# Patient Record
Sex: Male | Born: 1955 | Race: White | Hispanic: No | Marital: Single | Smoking: Never smoker
Health system: Southern US, Community
[De-identification: ages and names within clinical notes are randomized; demographics above are authoritative.]

## PROBLEM LIST (undated history)

## (undated) DIAGNOSIS — F32A Depression, unspecified: Secondary | ICD-10-CM

## (undated) DIAGNOSIS — I2699 Other pulmonary embolism without acute cor pulmonale: Secondary | ICD-10-CM

## (undated) DIAGNOSIS — I890 Lymphedema, not elsewhere classified: Secondary | ICD-10-CM

## (undated) DIAGNOSIS — I10 Essential (primary) hypertension: Secondary | ICD-10-CM

## (undated) DIAGNOSIS — L8990 Pressure ulcer of unspecified site, unspecified stage: Secondary | ICD-10-CM

## (undated) DIAGNOSIS — Z89611 Acquired absence of right leg above knee: Secondary | ICD-10-CM

## (undated) DIAGNOSIS — S88911A Complete traumatic amputation of right lower leg, level unspecified, initial encounter: Secondary | ICD-10-CM

## (undated) DIAGNOSIS — Z96652 Presence of left artificial knee joint: Secondary | ICD-10-CM

## (undated) HISTORY — DX: Lymphedema, not elsewhere classified: I89.0

## (undated) HISTORY — PX: OTHER SURGICAL HISTORY: SHX169

## (undated) HISTORY — PX: SHOULDER SURGERY: SHX246

## (undated) HISTORY — PX: KNEE FUSION: SUR623

## (undated) HISTORY — PX: HIP SURGERY: SHX245

## (undated) HISTORY — DX: Pressure ulcer of unspecified site, unspecified stage: L89.90

## (undated) HISTORY — PX: KNEE SURGERY: SHX244

## (undated) HISTORY — PX: VARICOSE VEIN SURGERY: SHX832

## (undated) HISTORY — PX: REPLACEMENT TOTAL KNEE: SUR1224

## (undated) HISTORY — PX: ACHILLES TENDON SURGERY: SHX542

## (undated) HISTORY — PX: EPIGASTRIC HERNIA REPAIR: SUR1177

---

## 2008-10-20 HISTORY — PX: OTHER SURGICAL HISTORY: SHX169

## 2011-07-02 HISTORY — PX: REPLACEMENT TOTAL KNEE: SUR1224

## 2011-08-13 DIAGNOSIS — Z86718 Personal history of other venous thrombosis and embolism: Secondary | ICD-10-CM

## 2011-08-13 HISTORY — DX: Personal history of other venous thrombosis and embolism: Z86.718

## 2011-09-11 HISTORY — PX: KNEE JOINT MANIPULATION: SHX386

## 2012-11-03 HISTORY — PX: CARDIAC SURGERY: SHX584

## 2015-02-03 HISTORY — PX: NECK SURGERY: SHX720

## 2015-02-11 HISTORY — PX: SPINE SURGERY: SHX786

## 2015-07-28 HISTORY — PX: CERVICAL FUSION: SHX112

## 2015-10-04 HISTORY — PX: OTHER SURGICAL HISTORY: SHX169

## 2015-11-02 HISTORY — PX: OTHER SURGICAL HISTORY: SHX169

## 2016-10-19 ENCOUNTER — Encounter: Payer: Self-pay | Admitting: *Deleted

## 2016-10-19 ENCOUNTER — Emergency Department: Payer: Medicare Other

## 2016-10-19 ENCOUNTER — Emergency Department
Admission: EM | Admit: 2016-10-19 | Discharge: 2016-10-19 | Disposition: A | Discharge status: Home / Self Care | Payer: Medicare Other | Attending: Emergency Medicine | Admitting: Emergency Medicine

## 2016-10-19 DIAGNOSIS — L03116 Cellulitis of left lower limb: Secondary | ICD-10-CM | POA: Diagnosis not present

## 2016-10-19 DIAGNOSIS — R2242 Localized swelling, mass and lump, left lower limb: Secondary | ICD-10-CM | POA: Diagnosis present

## 2016-10-19 DIAGNOSIS — Z89611 Acquired absence of right leg above knee: Secondary | ICD-10-CM | POA: Insufficient documentation

## 2016-10-19 HISTORY — DX: Complete traumatic amputation of right lower leg, level unspecified, initial encounter: S88.911A

## 2016-10-19 HISTORY — DX: Presence of left artificial knee joint: Z96.652

## 2016-10-19 HISTORY — DX: Other pulmonary embolism without acute cor pulmonale: I26.99

## 2016-10-19 LAB — COMPREHENSIVE METABOLIC PANEL
ALT: 19 U/L (ref 17–63)
ANION GAP: 9 (ref 5–15)
AST: 27 U/L (ref 15–41)
Albumin: 4.1 g/dL (ref 3.5–5.0)
Alkaline Phosphatase: 73 U/L (ref 38–126)
BILIRUBIN TOTAL: 1.6 mg/dL — AB (ref 0.3–1.2)
BUN: 11 mg/dL (ref 6–20)
CHLORIDE: 104 mmol/L (ref 101–111)
CO2: 22 mmol/L (ref 22–32)
Calcium: 9.6 mg/dL (ref 8.9–10.3)
Creatinine, Ser: 0.9 mg/dL (ref 0.61–1.24)
Glucose, Bld: 90 mg/dL (ref 65–99)
POTASSIUM: 4.3 mmol/L (ref 3.5–5.1)
Sodium: 135 mmol/L (ref 135–145)
TOTAL PROTEIN: 7.8 g/dL (ref 6.5–8.1)

## 2016-10-19 LAB — CBC WITH DIFFERENTIAL/PLATELET
BASOS ABS: 0.1 10*3/uL (ref 0–0.1)
Basophils Relative: 1 %
EOS PCT: 4 %
Eosinophils Absolute: 0.2 10*3/uL (ref 0–0.7)
HCT: 45.3 % (ref 40.0–52.0)
HEMOGLOBIN: 15.4 g/dL (ref 13.0–18.0)
LYMPHS PCT: 25 %
Lymphs Abs: 1.5 10*3/uL (ref 1.0–3.6)
MCH: 29.5 pg (ref 26.0–34.0)
MCHC: 34.1 g/dL (ref 32.0–36.0)
MCV: 86.5 fL (ref 80.0–100.0)
Monocytes Absolute: 0.9 10*3/uL (ref 0.2–1.0)
Monocytes Relative: 15 %
NEUTROS ABS: 3.3 10*3/uL (ref 1.4–6.5)
NEUTROS PCT: 55 %
PLATELETS: 254 10*3/uL (ref 150–440)
RBC: 5.23 MIL/uL (ref 4.40–5.90)
RDW: 14.1 % (ref 11.5–14.5)
WBC: 5.9 10*3/uL (ref 3.8–10.6)

## 2016-10-19 MED ORDER — SULFAMETHOXAZOLE-TRIMETHOPRIM 800-160 MG PO TABS
1.0000 | ORAL_TABLET | Freq: Once | ORAL | Status: AC
  Administered 2016-10-19: 1 via ORAL
  Filled 2016-10-19: qty 1

## 2016-10-19 MED ORDER — CEPHALEXIN 500 MG PO CAPS
500.0000 mg | ORAL_CAPSULE | Freq: Once | ORAL | Status: AC
  Administered 2016-10-19: 500 mg via ORAL
  Filled 2016-10-19: qty 1

## 2016-10-19 MED ORDER — CEPHALEXIN 500 MG PO CAPS: 500.0000 mg | ORAL_CAPSULE | Freq: Four times a day (QID) | ORAL | 0 refills | Status: AC

## 2016-10-19 MED ORDER — SULFAMETHOXAZOLE-TRIMETHOPRIM 800-160 MG PO TABS: 1.0000 | ORAL_TABLET | Freq: Two times a day (BID) | ORAL | 0 refills | Status: AC

## 2016-10-19 NOTE — ED Triage Notes (Signed)
Pt arrives with left leg pain and swelling, states he has a wound on his lower left leg that is now weeping fluid and infection, states his toes are discolored, leg is tight and red, pt has hx of right AKA, awake and alert

## 2016-10-19 NOTE — ED Triage Notes (Signed)
First Nurse Note:  Arrives with c/o purulent drainage from left lower leg.  States has a history of lymphedema in left lower extremity and was started on Doxycycline on Monday.  Symptoms not improving.  Patient has history of right AKA and left knee replacement.

## 2016-10-19 NOTE — ED Notes (Signed)
X-ray at bedside

## 2016-10-19 NOTE — ED Provider Notes (Signed)
[facility] Emergency Department Provider Note  ____________________________________________   First MD Initiated Contact with Patient 10/19/16 1347     (approximate)  I have reviewed the triage vital signs and the nursing notes.   HISTORY  Chief Complaint Leg Pain and Wound Infection    HPI [name] is a 61 y.o. male who comes to the emergency department with several days of worsening painful swelling to his left lower extremity. Pain is moderate to severe aching throbbing worse with movement and improved with rest. 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 He went to an urgent care 2 days ago who placed him on doxycycline and he feels it is not improved.   Past Medical History:  Diagnosis Date  . Amputated right leg (HCC)   . History of left knee replacement   . PE (pulmonary thromboembolism) (HCC)     There are no active problems to display for this patient.   History reviewed. No pertinent surgical history.  Prior to Admission medications   Medication Sig Start Date End Date Taking? Authorizing Provider  cephALEXin (KEFLEX) 500 MG capsule Take 1 capsule (500 mg total) by mouth 4 (four) times daily. 10/19/16 11/02/16  Merrily Brittle, MD  sulfamethoxazole-trimethoprim (BACTRIM DS,SEPTRA DS) 800-160 MG tablet Take 1 tablet by mouth 2 (two) times daily. 10/19/16 11/02/16  Merrily Brittle, MD    Allergies Bacitracin and Vancomycin  No family history on file.  Social History Social History  Substance Use Topics  . Smoking status: Never Smoker  . Smokeless tobacco: Never Used  . Alcohol use No    Review of Systems Constitutional: No fever/chills Eyes: No visual changes. ENT: No sore throat. Cardiovascular: Denies chest pain. Respiratory: Denies shortness of breath. Gastrointestinal: No abdominal pain.   No nausea, no vomiting.  No diarrhea.  No constipation. Genitourinary: Negative for dysuria. Musculoskeletal: Negative for back pain. Skin: Negative for rash. Neurological: Negative for headaches, focal weakness or numbness.   ____________________________________________   PHYSICAL EXAM:  VITAL SIGNS: ED Triage Vitals  Enc Vitals Group     BP 10/19/16 1335 130/78     Pulse Rate 10/19/16 1335 73     Resp 10/19/16 1335 18     Temp 10/19/16 1335 98.8 F (37.1 C)     Temp Source 10/19/16 1335 Oral     SpO2 10/19/16 1335 97 %     Weight 10/19/16 1332 (!) 340 lb (154.2 kg)     Height 10/19/16 1332 6\' 5"  (1.956 m)     Head Circumference --      Peak Flow --      Pain Score 10/19/16 1332 7     Pain Loc --      Pain Edu? --      Excl. in GC? --     Constitutional: alert and oriented 4 choking laughing well appearing nontoxic no diaphoresis speaks in full clear sentences Eyes: PERRL EOMI. Head: Atraumatic. Nose: No congestion/rhinnorhea. Mouth/Throat: No trismus Neck: No stridor.   Cardiovascular: Normal rate, regular rhythm. Grossly normal heart sounds.  Good peripheral circulation. Respiratory: Normal respiratory effort.  No retractions. Lungs CTAB and moving good air Gastrointestinal: soft nontender Musculoskeletal: AKA on the right. Left lower extremity with chronic swelling nontender no crepitus to posterior cells. His pulse faint erythema and warmth slight serous-appearing discharge medially no purulence Neurologic:  Normal speech and language. No gross focal neurologic deficits are appreciated. Skin:  Skin is warm, dry and intact. No rash noted. Psychiatric: Mood and affect are normal. Speech and behavior are normal.    ____________________________________________   DIFFERENTIAL includes but not limited to  Cellulitis, abscess, necrotizing soft tissue infection, myositis, claudication ____________________________________________   LABS (all labs ordered are  listed, but only abnormal results are displayed)  Labs Reviewed  COMPREHENSIVE METABOLIC PANEL - Abnormal; Notable for the following:       Result Value   Total Bilirubin 1.6 (*)    All other components within normal limits  CBC WITH DIFFERENTIAL/PLATELET    Normal white count __________________________________________  EKG   ____________________________________________  RADIOLOGY  X-ray of the ankle with no fracture dislocation of his osteomyelitis ____________________________________________   PROCEDURES  Procedure(s) performed: no  Procedures  Critical Care performed: no  Observation: no ____________________________________________   INITIAL IMPRESSION / ASSESSMENT AND PLAN / ED COURSE  Pertinent labs & imaging results that were available during my care of the patient were reviewed by me and considered in my medical decision making (see chart for details).  The patient is well-appearing and not systemically ill. His symptoms have been progressive for several days which goes against necrotizing soft tissue infection. His pain is not out of proportion. He is a to posterior cells pedis pulse. I performed a bedside ultrasound which shows a large amount of cobblestoning but no obvious abscess. At this point I do believe he likely has an early cellulitis which would be better treated with Bactrim and Keflex instead of doxycycline. The 2 day follow-up given. Discharged home in good condition.      ____________________________________________   FINAL CLINICAL IMPRESSION(S) / ED DIAGNOSES  Final diagnoses:  Cellulitis of left lower extremity      NEW MEDICATIONS STARTED DURING THIS VISIT:  Discharge Medication List as of 10/19/2016  3:12 PM    START taking these medications   Details  cephALEXin (KEFLEX) 500 MG capsule Take 1 capsule (500 mg total) by mouth 4 (four) times daily., Starting Fri 10/19/2016, Until Fri 11/02/2016, Print      sulfamethoxazole-trimethoprim (BACTRIM DS,SEPTRA DS) 800-160 MG tablet Take 1 tablet by mouth 2 (two) times daily., Starting Fri 10/19/2016, Until Fri 11/02/2016, Print         Note:  This document was prepared using Dragon voice recognition software and may include unintentional dictation errors.     Merrily Brittle, MD 10/22/16 [phone]

## 2016-10-19 NOTE — ED Notes (Signed)
ED Provider at bedside. 

## 2016-10-19 NOTE — Discharge Instructions (Signed)
Please take all of your antibiotics as prescribed and make an appointment to follow up in wound care clinic this coming Monday for reevaluation. Return to the emergency department sooner for any new or worsening symptoms such as fevers, chills, worsening pain, or for any other issues whatsoever.  It was a pleasure to take care of you today, and thank you for coming to our emergency department.  If you have any questions or concerns before leaving please ask the nurse to grab me and I'm more than happy to go through your aftercare instructions again.  If you were prescribed any opioid pain medication today such as Norco, Vicodin, Percocet, morphine, hydrocodone, or oxycodone please make sure you do not drive when you are taking this medication as it can alter your ability to drive safely.  If you have any concerns once you are home that you are not improving or are in fact getting worse before you can make it to your follow-up appointment, please do not hesitate to call 911 and come back for further evaluation.  Merrily Brittle, MD  Results for orders placed or performed during the hospital encounter of 10/19/16  Comprehensive metabolic panel  Result Value Ref Range   Sodium 135 135 - 145 mmol/L   Potassium 4.3 3.5 - 5.1 mmol/L   Chloride 104 101 - 111 mmol/L   CO2 22 22 - 32 mmol/L   Glucose, Bld 90 65 - 99 mg/dL   BUN 11 6 - 20 mg/dL   Creatinine, Ser 9.32 0.61 - 1.24 mg/dL   Calcium 9.6 8.9 - 35.5 mg/dL   Total Protein 7.8 6.5 - 8.1 g/dL   Albumin 4.1 3.5 - 5.0 g/dL   AST 27 15 - 41 U/L   ALT 19 17 - 63 U/L   Alkaline Phosphatase 73 38 - 126 U/L   Total Bilirubin 1.6 (H) 0.3 - 1.2 mg/dL   GFR calc non Af Amer >60 >60 mL/min   GFR calc Af Amer >60 >60 mL/min   Anion gap 9 5 - 15  CBC with Differential  Result Value Ref Range   WBC 5.9 3.8 - 10.6 K/uL   RBC 5.23 4.40 - 5.90 MIL/uL   Hemoglobin 15.4 13.0 - 18.0 g/dL   HCT 73.2 20.2 - 54.2 %   MCV 86.5 80.0 - 100.0 fL   MCH 29.5  26.0 - 34.0 pg   MCHC 34.1 32.0 - 36.0 g/dL   RDW 70.6 23.7 - 62.8 %   Platelets 254 150 - 440 K/uL   Neutrophils Relative % 55 %   Neutro Abs 3.3 1.4 - 6.5 K/uL   Lymphocytes Relative 25 %   Lymphs Abs 1.5 1.0 - 3.6 K/uL   Monocytes Relative 15 %   Monocytes Absolute 0.9 0.2 - 1.0 K/uL   Eosinophils Relative 4 %   Eosinophils Absolute 0.2 0 - 0.7 K/uL   Basophils Relative 1 %   Basophils Absolute 0.1 0 - 0.1 K/uL   Dg Ankle Complete Left  Result Date: 10/19/2016 CLINICAL DATA:  Lower extremity lymph edema. EXAM: LEFT ANKLE COMPLETE - 3+ VIEW COMPARISON:  None. FINDINGS: Large amount of soft tissue which could be a combination of obesity an edematous tissue. Bones in joints appear unremarkable. No traumatic or significant degenerative findings. There is calcification within the Achilles tendon complex. IMPRESSION: No significant bone or joint finding. Prominent soft tissue that could be a combination of obesity and edematous tissue. Electronically Signed   By: Paulina Fusi  M.D.   On: 10/19/2016 14:39

## 2016-10-26 ENCOUNTER — Ambulatory Visit: Payer: Medicare Other | Admitting: Surgery

## 2016-11-06 ENCOUNTER — Encounter: Payer: Medicare Other | Attending: Internal Medicine | Admitting: Internal Medicine

## 2016-11-06 DIAGNOSIS — Z86718 Personal history of other venous thrombosis and embolism: Secondary | ICD-10-CM | POA: Diagnosis not present

## 2016-11-06 DIAGNOSIS — I890 Lymphedema, not elsewhere classified: Secondary | ICD-10-CM | POA: Diagnosis not present

## 2016-11-06 DIAGNOSIS — Z89611 Acquired absence of right leg above knee: Secondary | ICD-10-CM | POA: Diagnosis not present

## 2016-11-06 DIAGNOSIS — L97221 Non-pressure chronic ulcer of left calf limited to breakdown of skin: Secondary | ICD-10-CM | POA: Diagnosis not present

## 2016-11-06 DIAGNOSIS — L03116 Cellulitis of left lower limb: Secondary | ICD-10-CM | POA: Insufficient documentation

## 2016-11-06 DIAGNOSIS — Z7901 Long term (current) use of anticoagulants: Secondary | ICD-10-CM | POA: Insufficient documentation

## 2016-11-06 DIAGNOSIS — G629 Polyneuropathy, unspecified: Secondary | ICD-10-CM | POA: Diagnosis not present

## 2016-11-06 DIAGNOSIS — Z96653 Presence of artificial knee joint, bilateral: Secondary | ICD-10-CM | POA: Insufficient documentation

## 2016-11-06 DIAGNOSIS — I87332 Chronic venous hypertension (idiopathic) with ulcer and inflammation of left lower extremity: Secondary | ICD-10-CM | POA: Insufficient documentation

## 2016-11-06 DIAGNOSIS — G4733 Obstructive sleep apnea (adult) (pediatric): Secondary | ICD-10-CM | POA: Diagnosis not present

## 2016-11-06 DIAGNOSIS — Z881 Allergy status to other antibiotic agents status: Secondary | ICD-10-CM | POA: Insufficient documentation

## 2016-11-07 NOTE — Progress Notes (Signed)
[name] ([id number]) Visit Report for 11/06/2016 Abuse/Suicide Risk Screen Details Patient Name: [name], [name] Date of Service: 11/06/2016 8:00 AM Medical Record Number: [id number] Patient Account Number: [id number] Date of Birth/Sex: Mar 29, 1955 (61 y.o. Male) Treating RN: Phillis Haggis Primary Care Rustyn Conery: Rolin Barry Other Clinician: Referring Joylynn Defrancesco: Voncille Lo Treating Rosie Golson/Extender: Rudene Re in Treatment: 0 Abuse/Suicide Risk Screen Items Answer ABUSE/SUICIDE RISK SCREEN: Has anyone close to you tried to hurt or harm you recentlyo No Do you feel uncomfortable with anyone in your familyo No Has anyone forced you do things that you didnot want to doo No Do you have any thoughts of harming yourselfo No Patient displays signs or symptoms of abuse and/or neglect. No Electronic Signature(s) Signed: 11/06/2016 5:27:59 PM By: Alejandro Mulling Entered By: Alejandro Mulling on 11/06/2016 08:15:01 Crossland, Yuniel ([id number]) -------------------------------------------------------------------------------- Activities of Daily Living Details Patient Name: [name] Date of Service: 11/06/2016 8:00 AM Medical Record Number: [id number] Patient Account Number: [id number] Date of Birth/Sex: 03/04/1956 (61 y.o. Male) Treating RN: Phillis Haggis Primary Care Shiara Mcgough: Rolin Barry Other Clinician: Referring Rudell Marlowe: Voncille Lo Treating Tyannah Sane/Extender: Rudene Re in Treatment: 0 Activities of Daily Living Items Answer Activities of Daily Living (Please select one for each item) Drive Automobile Not Able Take Medications Completely Able Use Telephone Completely Able Care for Appearance Completely Able Use Toilet Completely Able Bath / Shower Completely Able Dress Self Completely Able Feed Self Completely Able Walk Completely Able Get In / Out Bed Completely Able Housework Completely Able Prepare Meals Completely Able Handle Money Completely  Able Shop for Self Completely Able Electronic Signature(s) Signed: 11/06/2016 5:27:59 PM By: Alejandro Mulling Entered By: Alejandro Mulling on 11/06/2016 08:15:30 Pogorzelski, Chasten ([id number]) -------------------------------------------------------------------------------- Education Assessment Details Patient Name: [name] Date of Service: 11/06/2016 8:00 AM Medical Record Number: [id number] Patient Account Number: [id number] Date of Birth/Sex: 1955/10/05 (61 y.o. Male) Treating RN: Phillis Haggis Primary Care Skyley Grandmaison: Rolin Barry Other Clinician: Referring Blaike Newburn: Voncille Lo Treating Dawayne Ohair/Extender: Rudene Re in Treatment: 0 Primary Learner Assessed: Patient Learning Preferences/Education Level/Primary Language Learning Preference: Explanation, Printed Material Highest Education Level: College or Above Preferred Language: English Cognitive Barrier Assessment/Beliefs Language Barrier: No Translator Needed: No Memory Deficit: No Emotional Barrier: No Cultural/Religious Beliefs Affecting Medical No Care: Physical Barrier Assessment Impaired Vision: Yes Glasses Impaired Hearing: No Decreased Hand dexterity: No Knowledge/Comprehension Assessment Knowledge Level: Medium Comprehension Level: Medium Ability to understand written Medium instructions: Ability to understand verbal Medium instructions: Motivation Assessment Anxiety Level: Calm Cooperation: Cooperative Education Importance: Acknowledges Need Interest in Health Problems: Asks Questions Perception: Coherent Willingness to Engage in Self- Medium Management Activities: Readiness to Engage in Self- Medium Management Activities: Electronic Signature(s) Cambria, Mylen ([id number]) Signed: 11/06/2016 5:27:59 PM By: Alejandro Mulling Entered By: Alejandro Mulling on 11/06/2016 08:16:15 Reinitz, Delmas ([id number]) -------------------------------------------------------------------------------- Fall  Risk Assessment Details Patient Name: [name] Date of Service: 11/06/2016 8:00 AM Medical Record Number: [id number] Patient Account Number: [id number] Date of Birth/Sex: 1956-02-26 (61 y.o. Male) Treating RN: Phillis Haggis Primary Care Arma Reining: Rolin Barry Other Clinician: Referring Gwynne Kemnitz: Voncille Lo Treating Arraya Buck/Extender: Rudene Re in Treatment: 0 Fall Risk Assessment Items Have you had 2 or more falls in the last 12 monthso 0 No Have you had any fall that resulted in injury in the last 12 monthso 0 No FALL RISK ASSESSMENT: History of falling - immediate or within 3 months 0 No Secondary diagnosis 15 Yes Ambulatory aid None/bed rest/wheelchair/nurse 0 No Crutches/cane/walker 15 Yes Furniture 0 No IV Access/Saline Lock 0 No  Gait/Training Normal/bed rest/immobile 0 No Weak 10 Yes Impaired 20 Yes Mental Status Oriented to own ability 0 Yes Electronic Signature(s) Signed: 11/06/2016 5:27:59 PM By: Alejandro MullingPinkerton, Debra Entered By: Alejandro MullingPinkerton, Debra on 11/06/2016 08:26:57 Stennett, Yer ([id number]) -------------------------------------------------------------------------------- Foot Assessment Details Patient Name: [name] Date of Service: 11/06/2016 8:00 AM Medical Record Number: [id number] Patient Account Number: [id number] Date of Birth/Sex: 07-Dec-1955 [age] y.o. Male) Treating RN: Phillis HaggisPinkerton, Debi Primary Care Aaleah Hirsch: Rolin Barrylmedo, Mario Other Clinician: Referring Wing Schoch: Voncille LoGEERSEN, DANIEL Treating Renne Cornick/Extender: Maxwell CaulOBSON, MICHAEL G Weeks in Treatment: 0 Foot Assessment Items Site Locations + = Sensation present, - = Sensation absent, C = Callus, U = Ulcer R = Redness, W = Warmth, M = Maceration, PU = Pre-ulcerative lesion F = Fissure, S = Swelling, D = Dryness Assessment Right: Left: Other Deformity: No No Prior Foot Ulcer: No No Prior Amputation: No No Charcot Joint: No No Ambulatory Status: Ambulatory With Help Assistance Device:  Crutches Gait: Steady Electronic Signature(s) Signed: 11/06/2016 5:27:59 PM By: Alejandro MullingPinkerton, Debra Entered By: Alejandro MullingPinkerton, Debra on 11/06/2016 08:27:23 Hirt, Sanay ([id number]) -------------------------------------------------------------------------------- Nutrition Risk Assessment Details Patient Name: [name] SereneGABRIEL, Garrin Date of Service: 11/06/2016 8:00 AM Medical Record Number: [id number] Patient Account Number: [id number] Date of Birth/Sex: 07-Dec-1955 [age] y.o. Male) Treating RN: Phillis HaggisPinkerton, Debi Primary Care Elizabet Schweppe: Rolin Barrylmedo, Mario Other Clinician: Referring Zeffie Bickert: Voncille LoGEERSEN, DANIEL Treating Yoceline Bazar/Extender: Maxwell CaulOBSON, MICHAEL G Weeks in Treatment: 0 Height (in): 77 Weight (lbs): 340 Body Mass Index (BMI): 40.3 Nutrition Risk Assessment Items NUTRITION RISK SCREEN: I have an illness or condition that made me change the kind and/or 2 Yes amount of food I eat I eat fewer than two meals per day 0 No I eat few fruits and vegetables, or milk products 0 No I have three or more drinks of beer, liquor or wine almost every day 0 No I have tooth or mouth problems that make it hard for me to eat 0 No I don't always have enough money to buy the food I need 0 No I eat alone most of the time 0 No I take three or more different prescribed or over-the-counter drugs a 0 No day Without wanting to, I have lost or gained 10 pounds in the last six 0 No months I am not always physically able to shop, cook and/or feed myself 0 No Nutrition Protocols Good Risk Protocol Moderate Risk Protocol Electronic Signature(s) Signed: 11/06/2016 5:27:59 PM By: Alejandro MullingPinkerton, Debra Entered By: Alejandro MullingPinkerton, Debra on 11/06/2016 08:27:05

## 2016-11-07 NOTE — Progress Notes (Signed)
Marriott, Tory ([id number]) Visit Report for 11/06/2016 Allergy List Details Patient Name: [name] SereneGABRIEL, Elhadji Date of Service: 11/06/2016 8:00 AM Medical Record Number: [id number] Patient Account Number: [id number] Date of Birth/Sex: [birth year] [age] y.o. Male) Treating RN: Phillis HaggisPinkerton, Debi Primary Care Jacklynn Dehaas: Rolin Barrylmedo, MarioVicente [name] Other Clinician: Referring Maleny Candy: Voncille LoGEERSEN, DANIEL Treating Dequavius Kuhner/Extender: Rudene ReBritto, Errol Weeks in Treatment: 0 Allergies Active Allergies bacitracin vancomycin Allergy Notes Electronic Signature(s) Signed: 11/06/2016 5:27:59 PM By: Alejandro MullingPinkerton, Debra Entered By: Alejandro MullingPinkerton, Debra on 11/06/2016 08:07:40 Ribera, Cristal ([id number]) -------------------------------------------------------------------------------- Arrival Information Details Patient Name: [name] SereneGABRIEL, Facundo Date of Service: 11/06/2016 8:00 AM Medical Record Number: [id number] Patient Account Number: [id number] Date of Birth/Sex: [birth year] 3(61 y.o. Male) Treating RN: Phillis HaggisPinkerton, Debi Primary Care Daxson Reffett: Rolin Barrylmedo, Mario Other Clinician: Referring Latessa Tillis: Voncille LoGEERSEN, DANIEL Treating Staley Lunz/Extender: Rudene ReBritto, Errol Weeks in Treatment: 0 Visit Information Patient Arrived: Crutches Arrival Time: 08:00 Accompanied By: self Transfer Assistance: EasyPivot Patient Lift Patient Identification Verified: Yes Secondary Verification Process Yes Completed: Patient Requires Transmission- No Based Precautions: Patient Has Alerts: Yes Patient Alerts: Patient on Blood Thinner warfarin NO ABI d/t PAIN Electronic Signature(s) Signed: 11/06/2016 5:27:59 PM By: Alejandro MullingPinkerton, Debra Entered By: Alejandro MullingPinkerton, Debra on 11/06/2016 08:42:27 Renfrew, Kacper ([id number]) -------------------------------------------------------------------------------- Clinic Level of Care Assessment Details Patient Name: [name] SereneGABRIEL, Marx Date of Service: 11/06/2016 8:00 AM Medical Record Number: [id number] Patient Account Number: [id number] Date of Birth/Sex:  [birth year] [age] y.o. Male) Treating RN: Phillis HaggisPinkerton, Debi Primary Care Vienna Folden: Rolin Barrylmedo, Mario Other Clinician: Referring Kinsey Cowsert: Voncille LoGEERSEN, DANIEL Treating Tobin Cadiente/Extender: Maxwell CaulOBSON, MICHAEL G Weeks in Treatment: 0 Clinic Level of Care Assessment Items TOOL 2 Quantity Score X - Use when only an EandM is performed on the INITIAL visit 1 0 ASSESSMENTS - Nursing Assessment / Reassessment X - General Physical Exam (combine w/ comprehensive assessment (listed just 1 20 below) when performed on new pt. evals) X - Comprehensive Assessment (HX, ROS, Risk Assessments, Wounds Hx, etc.) 1 25 ASSESSMENTS - Wound and Skin Assessment / Reassessment []  - Simple Wound Assessment / Reassessment - one wound 0 X - Complex Wound Assessment / Reassessment - multiple wounds 2 5 []  - Dermatologic / Skin Assessment (not related to wound area) 0 ASSESSMENTS - Ostomy and/or Continence Assessment and Care []  - Incontinence Assessment and Management 0 []  - Ostomy Care Assessment and Management (repouching, etc.) 0 PROCESS - Coordination of Care []  - Simple Patient / Family Education for ongoing care 0 X - Complex (extensive) Patient / Family Education for ongoing care 1 20 X - Staff obtains ChiropractorConsents, Records, Test Results / Process Orders 1 10 X - Staff telephones HHA, Nursing Homes / Clarify orders / etc 1 10 []  - Routine Transfer to another Facility (non-emergent condition) 0 []  - Routine Hospital Admission (non-emergent condition) 0 X - New Admissions / Manufacturing engineernsurance Authorizations / Ordering NPWT, Apligraf, etc. 1 15 []  - Emergency Hospital Admission (emergent condition) 0 X - Simple Discharge Coordination 1 10 Benda, Anel ([id number]) []  - Complex (extensive) Discharge Coordination 0 PROCESS - Special Needs []  - Pediatric / Minor Patient Management 0 []  - Isolation Patient Management 0 []  - Hearing / Language / Visual special needs 0 []  - Assessment of Community assistance (transportation, D/C planning,  etc.) 0 []  - Additional assistance / Altered mentation 0 []  - Support Surface(s) Assessment (bed, cushion, seat, etc.) 0 INTERVENTIONS - Wound Cleansing / Measurement []  - Wound Imaging (photographs - any number of wounds) 0 X - Wound Tracing (instead of photographs) 1 5 []  - Simple Wound Measurement - one wound 0 X - Complex Wound  Measurement - multiple wounds 2 5 []  - Simple Wound Cleansing - one wound 0 X - Complex Wound Cleansing - multiple wounds 2 5 INTERVENTIONS - Wound Dressings []  - Small Wound Dressing one or multiple wounds 0 []  - Medium Wound Dressing one or multiple wounds 0 X - Large Wound Dressing one or multiple wounds 1 20 []  - Application of Medications - injection 0 INTERVENTIONS - Miscellaneous []  - External ear exam 0 []  - Specimen Collection (cultures, biopsies, blood, body fluids, etc.) 0 []  - Specimen(s) / Culture(s) sent or taken to Lab for analysis 0 []  - Patient Transfer (multiple staff / Michiel Sites Lift / Similar devices) 0 []  - Simple Staple / Suture removal (25 or less) 0 []  - Complex Staple / Suture removal (26 or more) 0 Stender, Swayze ([id number]) []  - Hypo / Hyperglycemic Management (close monitor of Blood Glucose) 0 []  - Ankle / Brachial Index (ABI) - do not check if billed separately 0 Has the patient been seen at the hospital within the last three years: Yes Total Score: 165 Level Of Care: New/Established - Level 5 Electronic Signature(s) Signed: 11/06/2016 5:27:59 PM By: Alejandro Mulling Entered By: Alejandro Mulling on 11/06/2016 16:45:54 [place], Cleve ([id number]) -------------------------------------------------------------------------------- Encounter Discharge Information Details Patient Name: [name], [name] Date of Service: 11/06/2016 8:00 AM Medical Record Number: [id number] Patient Account Number: [id number] Date of Birth/Sex: [birth year] (61 y.o. Male) Treating RN: Phillis Haggis Primary Care [place]: Rolin Barry Other Clinician: Referring  Rama Sorci: Voncille Lo Treating Michelene Keniston/Extender: Altamese [place] in Treatment: 0 Encounter Discharge Information Items Discharge Pain Level: 10 Discharge Condition: Stable Ambulatory Status: Crutches Discharge Destination: Home Transportation: Private Auto Accompanied By: self Schedule Follow-up Appointment: Yes Medication Reconciliation completed No and provided to Patient/Care Kamie Korber: Provided on Clinical Summary of Care: 11/06/2016 Form Type Recipient Paper Patient EG Electronic Signature(s) Signed: 11/07/2016 8:52:57 AM By: Gwenlyn Perking Entered By: Gwenlyn Perking on 11/06/2016 09:35:17 Facey, Makena ([id number]) -------------------------------------------------------------------------------- General Visit Notes Details Patient Name: [name], Kailin Date of Service: 11/06/2016 8:00 AM Medical Record Number: [id number] Patient Account Number: [id number] Date of Birth/Sex: Feb 04, 1956 (61 y.o. Male) Treating RN: Phillis Haggis Primary Care Azie Mcconahy: Rolin Barry Other Clinician: Referring Zohal Reny: Voncille Lo Treating Shruthi Northrup/Extender: Maxwell Caul Weeks in Treatment: 0 Notes I was unable to get an ABI on this pt d/t extreme pain. Dr. Leanord Hawking was made aware of this. Though [facility] have records on this pt I called and asked them to send them over and have yet to get them, Dr. Leanord Hawking ordered to have pt to be wrapped in a 4 layer wrap. I did make him aware of the ABI situation. Electronic Signature(s) Signed: 11/06/2016 4:48:55 PM By: Alejandro Mulling Entered By: Alejandro Mulling on 11/06/2016 16:48:54 Obremski, Rohil ([id number]) -------------------------------------------------------------------------------- Lower Extremity Assessment Details Patient Name: [name], Darwyn Date of Service: 11/06/2016 8:00 AM Medical Record Number: [id number] Patient Account Number: [id number] Date of Birth/Sex: Dec 01, 1955 (61 y.o. Male) Treating RN: Ashok Cordia, Debi Primary Care  Meliyah Simon: Rolin Barry Other Clinician: Referring Emiya Loomer: Voncille Lo Treating Taffany Heiser/Extender: Maxwell Caul Weeks in Treatment: 0 Edema Assessment Assessed: [Left: No] [Right: No] Edema: [Left: Ye] [Right: s] Calf Left: Right: Point of Measurement: 36 cm From Medial Instep 64.5 cm cm Ankle Left: Right: Point of Measurement: 13 cm From Medial Instep 44 cm cm Vascular Assessment Pulses: Dorsalis Pedis Palpable: [Left:No] Doppler Audible: [Left:Yes] Posterior Tibial Palpable: [Left:No] Doppler Audible: [Left:Yes] Extremity colors, hair growth, and conditions: Extremity Color: [Left:Red] Hair Growth on Extremity: [Left:Yes] Temperature of Extremity: [  Left:Warm] Capillary Refill: [Left:< 3 seconds] Toe Nail Assessment Left: Right: Thick: Yes Discolored: Yes Deformed: Yes Improper Length and Hygiene: No Notes NO ABI d/t PAIN Kalb, Corderro ([id number]) Electronic Signature(s) Signed: 11/06/2016 5:27:59 PM By: Alejandro Mulling Entered By: Alejandro Mulling on 11/06/2016 08:42:15 Balderson, Anna ([id number]) -------------------------------------------------------------------------------- Multi Wound Chart Details Patient Name: [name], Axiel Date of Service: 11/06/2016 8:00 AM Medical Record Number: [id number] Patient Account Number: [id number] Date of Birth/Sex: [birth year] (61 y.o. Male) Treating RN: Phillis Haggis Primary Care Jesseca Marsch: Rolin Barry Other Clinician: Referring Wyeth Hoffer: Voncille Lo Treating Aliegha Paullin/Extender: Maxwell Caul Weeks in Treatment: 0 Vital Signs Height(in): 77 Pulse(bpm): 82 Weight(lbs): 340 Blood Pressure 130/65 (mmHg): Body Mass Index(BMI): 40 Temperature(F): 98.3 Respiratory Rate 20 (breaths/min): Photos: [N/A:N/A] Wound Location: Left Lower Leg - Anterior Left Lower Leg - Posterior N/A Wounding Event: Gradually Appeared Gradually Appeared N/A Primary Etiology: Lymphedema Lymphedema N/A Comorbid History:  Neuropathy Neuropathy N/A Date Acquired: 10/23/2016 10/23/2016 N/A Weeks of Treatment: 0 0 N/A Wound Status: Open Open N/A Measurements L x W x D 10x8x0.1 12x13x0.1 N/A (cm) Area (cm) : 40.347 122.522 N/A Volume (cm) : 6.283 12.252 N/A Classification: Partial Thickness Partial Thickness N/A Exudate Amount: Large Large N/A Exudate Type: Serous Serous N/A Exudate Color: amber amber N/A Wound Margin: Flat and Intact Flat and Intact N/A Granulation Amount: Large (67-100%) Large (67-100%) N/A Granulation Quality: Red Red N/A Necrotic Amount: Small (1-33%) Small (1-33%) N/A Epithelialization: None None N/A Periwound Skin Texture: No Abnormalities Noted No Abnormalities Noted N/A Maceration: Yes Maceration: Yes N/A Zakarian, Bonifacio ([id number]) Periwound Skin Moisture: Periwound Skin Color: No Abnormalities Noted Erythema: Yes N/A Erythema Location: N/A Circumferential N/A Temperature: No Abnormality No Abnormality N/A Tenderness on Yes Yes N/A Palpation: Wound Preparation: Ulcer Cleansing: Ulcer Cleansing: N/A Rinsed/Irrigated with Rinsed/Irrigated with Saline Saline Topical Anesthetic Topical Anesthetic Applied: None Applied: None Treatment Notes Electronic Signature(s) Signed: 11/07/2016 4:30:21 AM By: Baltazar Najjar MD Entered By: Baltazar Najjar on 11/06/2016 09:13:48 Rispoli, Timothee ([id number]) -------------------------------------------------------------------------------- Multi-Disciplinary Care Plan Details Patient Name: [name], Herbie Date of Service: 11/06/2016 8:00 AM Medical Record Number: [id number] Patient Account Number: [id number] Date of Birth/Sex: 1956/01/21 (61 y.o. Male) Treating RN: Phillis Haggis Primary Care Jahid Weida: Rolin Barry Other Clinician: Referring Breionna Punt: Voncille Lo Treating Forrest Jaroszewski/Extender: Maxwell Caul Weeks in Treatment: 0 Active Inactive ` Orientation to the Wound Care Program Nursing Diagnoses: Knowledge deficit related  to the wound healing center program Goals: Patient/caregiver will verbalize understanding of the Wound Healing Center Program Date Initiated: 11/06/2016 Target Resolution Date: 12/08/2016 Goal Status: Active Interventions: Provide education on orientation to the wound center Notes: ` Pain, Acute or Chronic Nursing Diagnoses: Pain, acute or chronic: actual or potential Potential alteration in comfort, pain Goals: Patient/caregiver will verbalize adequate pain control between visits Date Initiated: 11/06/2016 Target Resolution Date: 03/09/2017 Goal Status: Active Interventions: Complete pain assessment as per visit requirements Encourage patient to take pain medications as prescribed Notes: ` Wound/Skin Impairment Nursing Diagnoses: Macklin, Jacquin Ferdinando ([id number]) Impaired tissue integrity Knowledge deficit related to ulceration/compromised skin integrity Goals: Ulcer/skin breakdown will have a volume reduction of 80% by week 12 Date Initiated: 11/06/2016 Target Resolution Date: 03/02/2017 Goal Status: Active Interventions: Assess patient/caregiver ability to perform ulcer/skin care regimen upon admission and as needed Assess ulceration(s) every visit Notes: Electronic Signature(s) Signed: 11/06/2016 5:27:59 PM By: Alejandro Mulling Entered By: Alejandro Mulling on 11/06/2016 08:44:15 Sarafian, Franz ([id number]) -------------------------------------------------------------------------------- Pain Assessment Details Patient Name: [name], Bertel Date of Service: 11/06/2016 8:00 AM Medical Record Number: [id number] Patient Account Number:  [id number] Date of Birth/Sex: 1955/08/06 (61 y.o. Male) Treating RN: Ashok Cordia, Debi Primary Care Ryoma Nofziger: Rolin Barry Other Clinician: Referring Deshaun Schou: Voncille Lo Treating Shelisha Gautier/Extender: Rudene Re in Treatment: 0 Active Problems Location of Pain Severity and Description of Pain Patient Has Paino Yes Site Locations Pain  Location: Pain in Ulcers Rate the pain. Current Pain Level: 10 Character of Pain Describe the Pain: Burning Pain Management and Medication Current Pain Management: Electronic Signature(s) Signed: 11/06/2016 5:27:59 PM By: Alejandro Mulling Entered By: Alejandro Mulling on 11/06/2016 08:04:10 [place], Jyron ([id number]) -------------------------------------------------------------------------------- Patient/Caregiver Education Details Patient Name: [name], Jermar Date of Service: 11/06/2016 8:00 AM Medical Record Number: [id number] Patient Account Number: [id number] Date of Birth/Gender: [birth year] (61 y.o. Male) Treating RN: Phillis Haggis Primary Care Physician: Rolin Barry Other Clinician: Referring Physician: Voncille Lo Treating Physician/Extender: Altamese [place] in Treatment: 0 Education Assessment Education Provided To: Patient Education Topics Provided Wound/Skin Impairment: Handouts: Other: change dressing as ordered Methods: Demonstration, Explain/Verbal Responses: State content correctly Electronic Signature(s) Signed: 11/06/2016 5:27:59 PM By: Alejandro Mulling Entered By: Alejandro Mulling on 11/06/2016 09:01:04 Brayman, Alphonso ([id number]) -------------------------------------------------------------------------------- Wound Assessment Details Patient Name: [name], Toivo Date of Service: 11/06/2016 8:00 AM Medical Record Number: [id number] Patient Account Number: [id number] Date of Birth/Sex: December 01, 1955 (61 y.o. Male) Treating RN: Phillis Haggis Primary Care Karsynn Deweese: Rolin Barry Other Clinician: Referring Edon Hoadley: Voncille Lo Treating Maribella Kuna/Extender: Maxwell Caul Weeks in Treatment: 0 Wound Status Wound Number: 1 Primary Etiology: Lymphedema Wound Location: Left Lower Leg - Anterior Wound Status: Open Wounding Event: Gradually Appeared Comorbid History: Neuropathy Date Acquired: 10/23/2016 Weeks Of Treatment: 0 Clustered Wound:  No Photos Photo Uploaded By: Alejandro Mulling on 11/06/2016 08:46:27 Wound Measurements Length: (cm) 10 Width: (cm) 8 Depth: (cm) 0.1 Area: (cm) 62.832 Volume: (cm) 6.283 % Reduction in Area: % Reduction in Volume: Epithelialization: None Tunneling: No Undermining: No Wound Description Classification: Partial Thickness Wound Margin: Flat and Intact Exudate Amount: Large Exudate Type: Serous Exudate Color: amber Foul Odor After Cleansing: No Slough/Fibrino Yes Wound Bed Granulation Amount: Large (67-100%) Granulation Quality: Red Necrotic Amount: Small (1-33%) Necrotic Quality: Adherent Slough Hice, Mercedes ([id number]) Periwound Skin Texture Texture Color No Abnormalities Noted: No No Abnormalities Noted: No Moisture Temperature / Pain No Abnormalities Noted: No Temperature: No Abnormality Maceration: Yes Tenderness on Palpation: Yes Wound Preparation Ulcer Cleansing: Rinsed/Irrigated with Saline Topical Anesthetic Applied: None Treatment Notes Wound #1 (Left, Anterior Lower Leg) 1. Cleansed with: Clean wound with Normal Saline Cleanse wound with antibacterial soap and water 3. Peri-wound Care: Barrier cream 4. Dressing Applied: Aquacel Ag 5. Secondary Dressing Applied ABD Pad 7. Secured with Tape 4-Layer Compression System - Left Lower Extremity Notes xtrasorb, unna to Ecologist) Signed: 11/06/2016 5:27:59 PM By: Alejandro Mulling Entered By: Alejandro Mulling on 11/06/2016 08:38:39 [place], Carliss ([id number]) -------------------------------------------------------------------------------- Wound Assessment Details Patient Name: [name], Kweli Date of Service: 11/06/2016 8:00 AM Medical Record Number: [id number] Patient Account Number: [id number] Date of Birth/Sex: [birth year] (61 y.o. Male) Treating RN: Phillis Haggis Primary Care Farhaan Mabee: Rolin Barry Other Clinician: Referring Miku Udall: Voncille Lo Treating Kody Vigil/Extender:  Maxwell Caul Weeks in Treatment: 0 Wound Status Wound Number: 2 Primary Etiology: Lymphedema Wound Location: Left Lower Leg - Posterior Wound Status: Open Wounding Event: Gradually Appeared Comorbid History: Neuropathy Date Acquired: 10/23/2016 Weeks Of Treatment: 0 Clustered Wound: No Photos Photo Uploaded By: Alejandro Mulling on 11/06/2016 08:46:28 Wound Measurements Length: (cm) 12 Width: (cm) 13 Depth: (cm) 0.1 Area: (cm) 122.522 Volume: (cm) 12.252 % Reduction in Area: % Reduction  in Volume: Epithelialization: None Tunneling: No Undermining: No Wound Description Classification: Partial Thickness Wound Margin: Flat and Intact Exudate Amount: Large Exudate Type: Serous Exudate Color: amber Foul Odor After Cleansing: No Slough/Fibrino Yes Wound Bed Granulation Amount: Large (67-100%) Granulation Quality: Red Necrotic Amount: Small (1-33%) Necrotic Quality: Adherent Slough Patch, Giancarlos ([id number]) Periwound Skin Texture Texture Color No Abnormalities Noted: No No Abnormalities Noted: No Erythema: Yes Moisture Erythema Location: Circumferential No Abnormalities Noted: No Maceration: Yes Temperature / Pain Temperature: No Abnormality Tenderness on Palpation: Yes Wound Preparation Ulcer Cleansing: Rinsed/Irrigated with Saline Topical Anesthetic Applied: None Treatment Notes Wound #2 (Left, Posterior Lower Leg) 1. Cleansed with: Clean wound with Normal Saline Cleanse wound with antibacterial soap and water 3. Peri-wound Care: Barrier cream 4. Dressing Applied: Aquacel Ag 5. Secondary Dressing Applied ABD Pad 7. Secured with Tape 4-Layer Compression System - Left Lower Extremity Notes xtrasorb, unna to Ecologist) Signed: 11/06/2016 5:27:59 PM By: Alejandro Mulling Entered By: Alejandro Mulling on 11/06/2016 08:40:58 [place], Kobi  ([id number]) -------------------------------------------------------------------------------- Vitals Details Patient Name: [name], Moise Date of Service: 11/06/2016 8:00 AM Medical Record Number: [id number] Patient Account Number: [id number] Date of Birth/Sex: 25-Jan-1956 (61 y.o. Male) Treating RN: Phillis Haggis Primary Care Melinda Pottinger: Rolin Barry Other Clinician: Referring Erza Mothershead: Voncille Lo Treating Rheannon Cerney/Extender: Rudene Re in Treatment: 0 Vital Signs Time Taken: 08:04 Temperature (F): 98.3 Height (in): 77 Pulse (bpm): 82 Source: Stated Respiratory Rate (breaths/min): 20 Weight (lbs): 340 Blood Pressure (mmHg): 130/65 Source: Measured Reference Range: 80 - 120 mg / dl Body Mass Index (BMI): 40.3 Electronic Signature(s) Signed: 11/06/2016 5:27:59 PM By: Alejandro Mulling Entered By: Alejandro Mulling on 11/06/2016 08:07:14

## 2016-11-09 NOTE — Progress Notes (Addendum)
[name], Kaufhold [name] ([id number]) Visit Report for 11/06/2016 Chief Complaint Document Details Patient Name: [name], Fogleman Howard Date of Service: 11/06/2016 8:00 AM Medical Record Number: [id number] Patient Account Number: [id number] Date of Birth/Sex: 1955-10-03 (61 y.o. Male) Treating RN: Curtis Sites Primary Care Provider: Rolin Barry Other Clinician: Referring Provider: Voncille Lo Treating Provider/Extender: Maxwell Caul Weeks in Treatment: 0 Information Obtained from: Patient Chief Complaint 11/06/16; patient is here for review of 2 open wounds on the left anterior and left posterior calf Electronic Signature(s) Signed: 11/07/2016 4:30:21 AM By: Baltazar Najjar MD Entered By: Baltazar Najjar on 11/06/2016 09:14:35 Vonruden, Nesbit ([id number]) -------------------------------------------------------------------------------- HPI Details Patient Name: [name] [name] Date of Service: 11/06/2016 8:00 AM Medical Record Number: [id number] Patient Account Number: [id number] Date of Birth/Sex: May 01, 1955 (61 y.o. Male) Treating RN: Curtis Sites Primary Care Provider: Rolin Barry Other Clinician: Referring Provider: Voncille Lo Treating Provider/Extender: Maxwell Caul Weeks in Treatment: 0 History of Present Illness HPI Description: 11/06/16; this is a 61 year old man is here for review of 2 large open areas on the anterior and posterior medial Part of the lef t. He tells me that he has had significant edema in the left leg for 5 years and was told that this is lymphedema. His record in care everywhere states this is primary lymphedema.Marland Kitchen He is not a diabetic. 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 He has a prosthesis. The patient tells me that his problems started 2 weeks ago. I am able to see that he saw his primary doctor on 10/09/16 with an ulcer on the medial aspect of the left calf. He felt these were infected  and he was started on doxycycline for 14 days. Subsequently he has been on Keflex and Bactrim.Marland Kitchen He has come to the attention of the [facility] and apparently he is on some list for compassionate release of an external compression pump which would be really quite helpful for this man. He states he is in a lot of pain and that the antibiotics of really not made much difference. He had apparently a ultrasound of the left calf which was negative for DVT although I don't see this result. The patient states he had an ultrasound done. Lab work from 8/17 showed a normal comprehensive metabolic panel and CBC with differential and x-ray of the left ankle was negative and I'm able to see this result. 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 Male) Treating RN: Curtis Sites Primary Care Provider: Rolin Barry Other Clinician: Referring Provider: Voncille Lo Treating Provider/Extender: Maxwell Caul Weeks in Treatment: 0 Constitutional Sitting or standing Blood Pressure is within target range for patient.. Pulse regular and within target range for patient.Marland Kitchen Respirations regular, non-labored and within target range.. Temperature is normal and within the target range for the patient.Marland Kitchen appears in no distress. Right above-knee prosthesis. Significant swelling in the left leg is obvious. Eyes Conjunctivae clear. No discharge. Respiratory Respiratory effort  is easy and symmetric bilaterally. Rate is normal at rest and on room air.. Bilateral breath sounds are clear and equal in all lobes with  no wheezes, rales or rhonchi.. Cardiovascular No evidence of CHF. Femoral arteries are palpable. Pedal pulses are faintly palpable on the left. Gastrointestinal (GI) Distended but no obvious fluid wave. No liver or spleen enlargement or tenderness.. Lymphatic None palpable in the left popliteal left groin or bilateral axilla. Musculoskeletal Left total knee replacement no overt obvious infection. Right above-knee amputation. Integumentary (Hair, Skin) No systemic rash. There is significant erythema of his left foot extending all the way to just below his knee in confluent fashion. Some warmth significant tenderness.Marland Kitchen Psychiatric No evidence of depression, anxiety, or agitation. Calm, cooperative, and communicative. Appropriate interactions and affect.. Notes Wound exam; the patient has a large open area on the left anterior leg superficially and on the posterior medial part of his leg and another large open area. This is limited to skin breakdown. Posteriorly there is nonviable surface but no debridement was done. He has erythema in the leg that is confluent. The areas are very tender even tender above his knee and the left leg posteriorly Electronic Signature(s) Signed: 11/07/2016 4:30:21 AM By: Baltazar Najjar MD Entered By: Baltazar Najjar on 11/06/2016 09:34:35 Bagot, Kamuela ([id number]) -------------------------------------------------------------------------------- Physician Orders Details Patient Name: [name] [name] Date of Service: 11/06/2016 8:00 AM Medical Record Number: [id number] Patient Account Number: [id number] Date of Birth/Sex: 1955/08/18 (61 y.o. Male) Treating RN: Phillis Haggis Primary Care Provider: Rolin Barry Other Clinician: Referring Provider: Voncille Lo Treating Provider/Extender: Maxwell Caul Weeks in Treatment:  0 Verbal / Phone Orders: No Diagnosis Coding Wound Cleansing Wound #1 Left,Anterior Lower Leg o Clean wound with Normal Saline. o Cleanse wound with mild soap and water Wound #2 Left,Posterior Lower Leg o Clean wound with Normal Saline. o Cleanse wound with mild soap and water Anesthetic Wound #1 Left,Anterior Lower Leg o Topical Lidocaine 4% cream applied to wound bed prior to debridement - for clinic use Wound #2 Left,Posterior Lower Leg o Topical Lidocaine 4% cream applied to wound bed prior to debridement - for clinic use Skin Barriers/Peri-Wound Care Wound #1 Left,Anterior Lower Leg o Barrier cream Wound #2 Left,Posterior Lower Leg o Barrier cream Primary Wound Dressing Wound #1 Left,Anterior Lower Leg o Aquacel Ag Wound #2 Left,Posterior Lower Leg o Aquacel Ag Secondary Dressing Wound #1 Left,Anterior Lower Leg o ABD pad o XtraSorb Wound #2 Left,Posterior Lower Leg Violett, Hubert ([id number]) o ABD pad o XtraSorb Dressing Change Frequency Wound #1 Left,Anterior Lower Leg o Change Dressing Monday, Wednesday, Friday - HHRN to change 2 times a week and pt comes to wound care one time a week. Wound #2 Left,Posterior Lower Leg o Change Dressing Monday, Wednesday, Friday - HHRN to change 2 times a week and pt comes to wound care one time a week. o Other: - HHRN PRN visits as many as needed. Follow-up Appointments Wound #1 Left,Anterior Lower Leg o Return Appointment in 1 week. Wound #2 Left,Posterior Lower Leg o Return Appointment in 1 week. o Other: - HHRN PRN visits as many as needed. Edema Control Wound #1 Left,Anterior Lower Leg o 4-Layer Compression System - Left Lower Extremity - unna to anchor Wound #2 Left,Posterior Lower Leg o 4-Layer Compression System - Left Lower Extremity - unna to anchor Additional Orders / Instructions Wound #1 Left,Anterior Lower Leg o Increase protein intake. Wound #2 Left,Posterior  Lower Leg o Increase protein intake. Home Health Wound #1 Left,Anterior Lower Leg o Initiate Home Health for Skilled Nursing o Home Health Nurse may visit PRN to address patientos wound care needs. o FACE TO FACE ENCOUNTER:  MEDICARE and MEDICAID PATIENTS: I certify that this patient is under my care and that I had a face-to-face encounter that meets the physician face-to-face encounter requirements with this patient on this date. The encounter with the patient was in whole or in part for the following MEDICAL CONDITION: (primary reason for Home Healthcare) MEDICAL NECESSITY: I certify, that based on my findings, NURSING services are a medically necessary home health service. HOME BOUND STATUS: I certify that my clinical findings support that this patient is homebound (i.e., Due to illness or injury, pt requires aid of supportive devices such as crutches, cane, wheelchairs, walkers, the use of special Wert, Challen ([id number]) transportation or the assistance of another person to leave their place of residence. There is a normal inability to leave the home and doing so requires considerable and taxing effort. Other absences are for medical reasons / religious services and are infrequent or of short duration when for other reasons). o If current dressing causes regression in wound condition, may D/C ordered dressing product/s and apply Normal Saline Moist Dressing daily until next Wound Healing Center / Other MD appointment. Notify Wound Healing Center of regression in wound condition at [phone]. o Please direct any NON-WOUND related issues/requests for orders to patient's Primary Care Physician Wound #2 Left,Posterior Lower Leg o Initiate Home Health for Skilled Nursing o Home Health Nurse may visit PRN to address patientos wound care needs. o FACE TO FACE ENCOUNTER: MEDICARE and MEDICAID PATIENTS: I certify that this patient is under my care and that I had a  face-to-face encounter that meets the physician face-to-face encounter requirements with this patient on this date. The encounter with the patient was in whole or in part for the following MEDICAL CONDITION: (primary reason for Home Healthcare) MEDICAL NECESSITY: I certify, that based on my findings, NURSING services are a medically necessary home health service. HOME BOUND STATUS: I certify that my clinical findings support that this patient is homebound (i.e., Due to illness or injury, pt requires aid of supportive devices such as crutches, cane, wheelchairs, walkers, the use of special transportation or the assistance of another person to leave their place of residence. There is a normal inability to leave the home and doing so requires considerable and taxing effort. Other absences are for medical reasons / religious services and are infrequent or of short duration when for other reasons). o If current dressing causes regression in wound condition, may D/C ordered dressing product/s and apply Normal Saline Moist Dressing daily until next Wound Healing Center / Other MD appointment. Notify Wound Healing Center of regression in wound condition at [phone]. o Please direct any NON-WOUND related issues/requests for orders to patient's Primary Care Physician Electronic Signature(s) Signed: 11/06/2016 5:27:59 PM By: Alejandro Mulling Signed: 11/07/2016 4:30:21 AM By: Baltazar Najjar MD Entered By: Alejandro Mulling on 11/06/2016 15:55:04 Pierpoint, Carney ([id number]) -------------------------------------------------------------------------------- Problem List Details Patient Name: [name], [name] Date of Service: 11/06/2016 8:00 AM Medical Record Number: [id number] Patient Account Number: [id number] Date of Birth/Sex: 16-Mar-1955 (61 y.o. 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 and 11/06/2016 Yes inflammation of left lower extremity L03.116 Cellulitis of left lower limb 11/06/2016 Yes Inactive Problems Resolved Problems  Electronic Signature(s) Signed: 11/07/2016 4:30:21 AM By: Baltazar Najjarobson, Michael MD Entered By: Baltazar Najjarobson, Michael on 11/06/2016 09:13:39 Bubeck, Dave ([id number]) -------------------------------------------------------------------------------- Progress Note Details Patient Name: [name] SereneGABRIEL, Nobel Date of Service: 11/06/2016 8:00 AM Medical Record Number: [id number] Patient Account Number: [id number] Date of Birth/Sex: [birth year](61 y.o. Male) Treating RN: Curtis Sitesorthy, Joanna Primary Care Provider: Rolin Barrylmedo, Mario Other Clinician: Referring Provider: Voncille LoGEERSEN, DANIEL Treating Provider/Extender: Altamese CarolinaOBSON, MICHAEL G Weeks in Treatment: 0 Subjective Chief Complaint Information obtained from Patient 11/06/16; patient is here for review of 2 open wounds on the left anterior and left posterior calf History of Present Illness (HPI) 11/06/16; this is a 61 year old man is here for review of 2 large open areas on the anterior and posterior medial Part of the lef t. He tells me that he has had significant edema in the left leg for 5 years and was told that this is lymphedema. His record in care everywhere states this is primary lymphedema.Marland Kitchen. He is not a diabetic. 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 He has a prosthesis. The patient tells me that his problems started 2 weeks ago. I am able to see that he saw his primary doctor on 10/09/16 with an ulcer on the medial aspect of the left calf. He felt  these were infected and he was started on doxycycline for 14 days. Subsequently he has been on Keflex and Bactrim.Marland Kitchen. He has come to the attention of the [facility] and apparently he is on some list for compassionate release of an external compression pump which would be really quite helpful for this man. He states he is in a lot of pain and that the antibiotics of really not made much difference. He had apparently a ultrasound of the left calf which was negative for DVT although I don't see this result. The patient states he had an ultrasound done. Lab work from 8/17 showed a normal comprehensive metabolic panel and CBC with differential and x-ray of the left ankle was negative and I'm able to see this result. 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 Patient has been treating wounds in the following manner: pads, wrapping, oral abx. Laboratory tests have not been performed in the last month. Patient reportedly has not tested positive for an antibiotic resistant organism. Patient reportedly has not tested positive for osteomyelitis. Patient reportedly has had testing performed to evaluate circulation in the legs. Patient experiences the following problems associated with their wounds: infection, swelling. Patient History Information obtained from Patient. Allergies bacitracin, vancomycin Fambro, Gianny ([id number]) Family History Cancer - Father, Heart Disease - Father, No family history of Diabetes, Hereditary Spherocytosis, Hypertension, Kidney Disease, Lung Disease, Seizures, Stroke, Thyroid Problems, Tuberculosis. Social History Never smoker, Marital Status - Married, Alcohol Use - Rarely, Drug Use - No History, Caffeine Use - Never. Medical History Neurologic Patient  has history of Neuropathy Review of Systems (ROS) Constitutional Symptoms (General Health) Complains or has symptoms of Chills. Eyes Complains or has symptoms of Glasses / Contacts. Ear/Nose/Mouth/Throat The patient has no complaints or symptoms. Hematologic/Lymphatic The patient has no complaints or symptoms, hx emololism, blood clots Respiratory The patient has no complaints or symptoms. Cardiovascular The patient has no complaints or symptoms, aortic aneyrism Gastrointestinal The patient has no complaints or symptoms. Endocrine  The patient has no complaints or symptoms. Genitourinary The patient has no complaints or symptoms. Immunological The patient has no complaints or symptoms. Integumentary (Skin) The patient has no complaints or symptoms. Musculoskeletal hx knee replacement Neurologic The patient has no complaints or symptoms. Oncologic The patient has no complaints or symptoms. Psychiatric The patient has no complaints or symptoms. Mckenny, Mark ([id number]) Objective Constitutional Sitting or standing Blood Pressure is within target range for patient.. Pulse regular and within target range for patient.Marland Kitchen Respirations regular, non-labored and within target range.. Temperature is normal and within the target range for the patient.Marland Kitchen appears in no distress. Right above-knee prosthesis. Significant swelling in the left leg is obvious. Vitals Time Taken: 8:04 AM, Height: 77 in, Source: Stated, Weight: 340 lbs, Source: Measured, BMI: 40.3, Temperature: 98.3 F, Pulse: 82 bpm, Respiratory Rate: 20 breaths/min, Blood Pressure: 130/65 mmHg. Eyes Conjunctivae clear. No discharge. Respiratory Respiratory effort is easy and symmetric bilaterally. Rate is normal at rest and on room air.. Bilateral breath sounds are clear and equal in all lobes with no wheezes, rales or rhonchi.. Cardiovascular No evidence of CHF. Femoral arteries are palpable. Pedal pulses are faintly  palpable on the left. Gastrointestinal (GI) Distended but no obvious fluid wave. No liver or spleen enlargement or tenderness.. Lymphatic None palpable in the left popliteal left groin or bilateral axilla. Musculoskeletal Left total knee replacement no overt obvious infection. Right above-knee amputation. Psychiatric No evidence of depression, anxiety, or agitation. Calm, cooperative, and communicative. Appropriate interactions and affect.. General Notes: Wound exam; the patient has a large open area on the left anterior leg superficially and on the posterior medial part of his leg and another large open area. This is limited to skin breakdown. Posteriorly there is nonviable surface but no debridement was done. He has erythema in the leg that is confluent. The areas are very tender even tender above his knee and the left leg posteriorly Integumentary (Hair, Skin) No systemic rash. There is significant erythema of his left foot extending all the way to just below his knee in confluent fashion. Some warmth significant tenderness.. Wound #1 status is Open. Original cause of wound was Gradually Appeared. The wound is located on the Left,Anterior Lower Leg. The wound measures 10cm length x 8cm width x 0.1cm depth; 62.832cm^2 area Valentino, Addis ([id number]) and 6.283cm^3 volume. There is no tunneling or undermining noted. There is a large amount of serous drainage noted. The wound margin is flat and intact. There is large (67-100%) red granulation within the wound bed. There is a small (1-33%) amount of necrotic tissue within the wound bed including Adherent Slough. The periwound skin appearance exhibited: Maceration. Periwound temperature was noted as No Abnormality. The periwound has tenderness on palpation. Wound #2 status is Open. Original cause of wound was Gradually Appeared. The wound is located on the Left,Posterior Lower Leg. The wound measures 12cm length x 13cm width x 0.1cm depth;  122.522cm^2 area and 12.252cm^3 volume. There is no tunneling or undermining noted. There is a large amount of serous drainage noted. The wound margin is flat and intact. There is large (67-100%) red granulation within the wound bed. There is a small (1-33%) amount of necrotic tissue within the wound bed including Adherent Slough. The periwound skin appearance exhibited: Maceration, Erythema. The surrounding wound skin color is noted with erythema which is circumferential. Periwound temperature was noted as No Abnormality. The periwound has tenderness on palpation. Assessment Active Problems ICD-10 L97.221 - Non-pressure chronic ulcer of left calf limited  to breakdown of skin I89.0 - Lymphedema, not elsewhere classified I87.332 - Chronic venous hypertension (idiopathic) with ulcer and inflammation of left lower extremity L03.116 - Cellulitis of left lower limb Plan Wound Cleansing: Wound #1 Left,Anterior Lower Leg: Clean wound with Normal Saline. Cleanse wound with mild soap and water Wound #2 Left,Posterior Lower Leg: Clean wound with Normal Saline. Cleanse wound with mild soap and water Anesthetic: Wound #1 Left,Anterior Lower Leg: Topical Lidocaine 4% cream applied to wound bed prior to debridement - for clinic use Wound #2 Left,Posterior Lower Leg: Topical Lidocaine 4% cream applied to wound bed prior to debridement - for clinic use Skin Barriers/Peri-Wound Care: Wound #1 Left,Anterior Lower Leg: Barrier cream Andres, Korie ([id number]) Wound #2 Left,Posterior Lower Leg: Barrier cream Primary Wound Dressing: Wound #1 Left,Anterior Lower Leg: Aquacel Ag Wound #2 Left,Posterior Lower Leg: Aquacel Ag Secondary Dressing: Wound #1 Left,Anterior Lower Leg: ABD pad XtraSorb Wound #2 Left,Posterior Lower Leg: ABD pad XtraSorb Dressing Change Frequency: Wound #1 Left,Anterior Lower Leg: Change Dressing Monday, Wednesday, Friday - HHRN to change 2 times a week and pt comes to  wound care one time a week. Wound #2 Left,Posterior Lower Leg: Change Dressing Monday, Wednesday, Friday - HHRN to change 2 times a week and pt comes to wound care one time a week. Other: - HHRN PRN visits as many as needed. Follow-up Appointments: Wound #1 Left,Anterior Lower Leg: Return Appointment in 1 week. Wound #2 Left,Posterior Lower Leg: Return Appointment in 1 week. Other: - HHRN PRN visits as many as needed. Edema Control: Wound #1 Left,Anterior Lower Leg: 4-Layer Compression System - Left Lower Extremity - unna to anchor Wound #2 Left,Posterior Lower Leg: 4-Layer Compression System - Left Lower Extremity - unna to anchor Additional Orders / Instructions: Wound #1 Left,Anterior Lower Leg: Increase protein intake. Wound #2 Left,Posterior Lower Leg: Increase protein intake. Home Health: Wound #1 Left,Anterior Lower Leg: Initiate Home Health for Skilled Nursing Home Health Nurse may visit PRN to address patient s wound care needs. FACE TO FACE ENCOUNTER: MEDICARE and MEDICAID PATIENTS: I certify that this patient is under my care and that I had a face-to-face encounter that meets the physician face-to-face encounter requirements with this patient on this date. The encounter with the patient was in whole or in part for the following MEDICAL CONDITION: (primary reason for Home Healthcare) MEDICAL NECESSITY: I certify, that based on my findings, NURSING services are a medically necessary home health service. HOME BOUND STATUS: I certify that my clinical findings support that this patient is homebound (i.e., Due to illness or injury, pt requires aid of supportive devices such as crutches, cane, wheelchairs, walkers, the use of special transportation or the assistance of another person to leave their place of residence. There is a normal inability to leave the home and doing so requires considerable and taxing effort. Other absences are RHO, Kelsey ([id number]) for medical  reasons / religious services and are infrequent or of short duration when for other reasons). If current dressing causes regression in wound condition, may D/C ordered dressing product/s and apply Normal Saline Moist Dressing daily until next Wound Healing Center / Other MD appointment. Notify Wound Healing Center of regression in wound condition at [phone]. Please direct any NON-WOUND related issues/requests for orders to patient's Primary Care Physician Wound #2 Left,Posterior Lower Leg: Initiate Home Health for Skilled Nursing Home Health Nurse may visit PRN to address patient s wound care needs. FACE TO FACE ENCOUNTER: MEDICARE and MEDICAID PATIENTS: I certify that this  patient is under my care and that I had a face-to-face encounter that meets the physician face-to-face encounter requirements with this patient on this date. The encounter with the patient was in whole or in part for the following MEDICAL CONDITION: (primary reason for Home Healthcare) MEDICAL NECESSITY: I certify, that based on my findings, NURSING services are a medically necessary home health service. HOME BOUND STATUS: I certify that my clinical findings support that this patient is homebound (i.e., Due to illness or injury, pt requires aid of supportive devices such as crutches, cane, wheelchairs, walkers, the use of special transportation or the assistance of another person to leave their place of residence. There is a normal inability to leave the home and doing so requires considerable and taxing effort. Other absences are for medical reasons / religious services and are infrequent or of short duration when for other reasons). If current dressing causes regression in wound condition, may D/C ordered dressing product/s and apply Normal Saline Moist Dressing daily until next Wound Healing Center / Other MD appointment. Notify Wound Healing Center of regression in wound condition at [phone]. Please direct any  NON-WOUND related issues/requests for orders to patient's Primary Care Physician #1 in spite of the fact I couldn't be certain about his arterial status I thought this man requires 4 layer compression to see if we can get some of the edema out of his lower leg. Without this he is going to have confluent skin breakdown very soon #2 tells me he had a duplex ultrasound of his left leg to rule out DVT all see if I can find this in care everywhere #3 if he is eligible for a external compression pump that would be very helpful. Apparently somebody at [facility] is working through this. 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 He seems to think this may be on some form of compassionate release program #4 I can see why there was concerns about cellulitis although he is now on the second round of antibiotics without any improvement. Either this is resistant organisms or this is a noninfectious problem such as you might see with severe chronic venous inflammation. I did not alter his antibiotics #5 we will get home health in to change his dressing every second day which is Aquacel Ag, extrasorb or drawtex with ABD pads #6 he may require duplex ultrasound if I can't find this on care everywhere. He also may require arterial studies MAUE, Shane ([id number]) Electronic Signature(s) Signed: 11/08/2016 3:10:27 PM By: Elliot Gurney, BSN, RN, CWS, Kim RN, BSN Signed: 11/12/2016 12:53:17 PM By: Baltazar Najjar MD Previous Signature: 11/06/2016 1:58:36 PM Version By: Baltazar Najjar MD Entered By: Elliot Gurney, BSN, RN, CWS, Kim on 11/08/2016 12:51:15 Bradner, Aseem ([id number]) -------------------------------------------------------------------------------- ROS/PFSH Details Patient Name: [name] [place] Date of Service: 11/06/2016 8:00 AM Medical Record Number: [id number] Patient Account Number: [id number] Date of Birth/Sex: 1955/08/19 (61 y.o. Male) Treating RN: Phillis Haggis Primary Care Provider: Rolin Barry Other Clinician: Referring Provider: Voncille Lo Treating Provider/Extender: Rudene Re in Treatment: 0 Information Obtained From Patient Wound History Do you currently have one or more open woundso Yes How many open wounds do you currently haveo 4 Approximately how long have you had your woundso 2 weeks How have you been treating your wound(s) until nowo pads, wrapping, oral abx Has your wound(s) ever healed and then re-openedo No Have you had any lab work done in the past montho No Have you tested positive for an antibiotic resistant organism (  MRSA, No VRE)o Have you tested positive for osteomyelitis (bone infection)o No Have you had any tests for circulation on your legso Yes Who ordered the testo duke Where was the test doneo [facility] Have you had other problems associated with your woundso Infection, Swelling Constitutional Symptoms (General Health) Complaints and Symptoms: Positive for: Chills Eyes Complaints and Symptoms: Positive for: Glasses / Contacts Ear/Nose/Mouth/Throat Complaints and Symptoms: No Complaints or Symptoms Hematologic/Lymphatic Complaints and Symptoms: No Complaints or Symptoms Complaints and Symptoms: Review of System Notes: hx emololism, blood clots Gadea, Zhyon ([id number]) Respiratory Complaints and Symptoms: No Complaints or Symptoms Cardiovascular Complaints and Symptoms: No Complaints or Symptoms Complaints and Symptoms: Review of System Notes: aortic aneyrism Gastrointestinal Complaints and Symptoms: No Complaints or Symptoms Endocrine Complaints and Symptoms: No Complaints or Symptoms Genitourinary Complaints and Symptoms: No Complaints or Symptoms Immunological Complaints and Symptoms: No Complaints or Symptoms Integumentary (Skin) Complaints and Symptoms: No Complaints or Symptoms Musculoskeletal Complaints and Symptoms: Review of System Notes: hx knee replacement Neurologic Complaints and  Symptoms: No Complaints or Symptoms Medical History: Positive for: Neuropathy Regan, Branson ([id number]) Oncologic Complaints and Symptoms: No Complaints or Symptoms Psychiatric Complaints and Symptoms: No Complaints or Symptoms Immunizations Pneumococcal Vaccine: Received Pneumococcal Vaccination: No Family and Social History Cancer: Yes - Father; Diabetes: No; Heart Disease: Yes - Father; Hereditary Spherocytosis: No; Hypertension: No; Kidney Disease: No; Lung Disease: No; Seizures: No; Stroke: No; Thyroid Problems: No; Tuberculosis: No; Never smoker; Marital Status - Married; Alcohol Use: Rarely; Drug Use: No History; Caffeine Use: Never; Financial Concerns: No; Food, Clothing or Shelter Needs: No; Support System Lacking: No; Transportation Concerns: No; Advanced Directives: No; Patient does not want information on Advanced Directives; Do not resuscitate: No; Living Will: No; Medical Power of Attorney: No Electronic Signature(s) Signed: 11/06/2016 5:27:59 PM By: Alejandro Mulling Signed: 11/08/2016 8:02:46 AM By: Evlyn Kanner MD, FACS Entered By: Alejandro Mulling on 11/06/2016 08:15:46 Iacovelli, Zorian ([id number]) -------------------------------------------------------------------------------- SuperBill Details Patient Name: [name], [name] Date of Service: 11/06/2016 Medical Record Number: [id number] Patient Account Number: [id number] Date of Birth/Sex: Nov 19, 1955 (60 y.o. 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 L03.116 Cellulitis of left lower limb Facility  Procedures CPT4: Description Modifier Quantity Code 41324401 99215 - WOUND CARE VISIT-LEV 5 EST PT 1 CPT4: 02725366 (Facility Use Only) 29581LT - APPLY MULTLAY COMPRS LWR LT 1 LEG Physician Procedures CPT4: Description Modifier Quantity Code 4403474 99204 - WC PHYS LEVEL 4 - NEW PT 1 ICD-10 Description Diagnosis L97.221 Non-pressure chronic ulcer of left calf limited to breakdown of skin I89.0 Lymphedema, not elsewhere classified I87.332 Chronic  venous hypertension (idiopathic) with ulcer and inflammation of left lower extremity L03.116 Cellulitis of left lower limb Electronic Signature(s) Signed: 11/06/2016 5:27:59 PM By: Alejandro Mulling Signed: 11/07/2016 4:30:21 AM By: Baltazar Najjar MD Entered By: Alejandro Mulling on 11/06/2016 16:55:16

## 2016-11-13 ENCOUNTER — Ambulatory Visit: Payer: Medicare Other | Admitting: Internal Medicine

## 2016-11-14 ENCOUNTER — Ambulatory Visit: Payer: Medicare Other | Admitting: Internal Medicine

## 2016-11-21 ENCOUNTER — Encounter: Payer: Medicare Other | Admitting: Internal Medicine

## 2016-11-21 ENCOUNTER — Ambulatory Visit
Admission: RE | Admit: 2016-11-21 | Discharge: 2016-11-21 | Disposition: A | Discharge status: Home / Self Care | Payer: Medicare Other | Source: Ambulatory Visit | Attending: Internal Medicine | Admitting: Internal Medicine

## 2016-11-21 ENCOUNTER — Other Ambulatory Visit: Payer: Self-pay | Admitting: Internal Medicine

## 2016-11-21 DIAGNOSIS — M79662 Pain in left lower leg: Secondary | ICD-10-CM

## 2016-11-21 DIAGNOSIS — M7989 Other specified soft tissue disorders: Principal | ICD-10-CM

## 2016-11-21 DIAGNOSIS — M79605 Pain in left leg: Secondary | ICD-10-CM | POA: Diagnosis present

## 2016-11-21 DIAGNOSIS — I87332 Chronic venous hypertension (idiopathic) with ulcer and inflammation of left lower extremity: Secondary | ICD-10-CM | POA: Diagnosis not present

## 2016-11-22 NOTE — Progress Notes (Signed)
Mal, Asher Atom ([id number]) Visit Report for 11/21/2016 HPI Details Patient Name: [name], [name] Date of Service: 11/21/2016 8:15 AM Medical Record Number: [id number] Patient Account Number: [id number] Date of Birth/Sex: 04-25-1955 (61 y.o. Male) Treating RN: Huel Coventry Primary Care Provider: Rolin Barry Other Clinician: Referring Provider: Rolin Barry Treating Provider/Extender: Altamese Atmore in Treatment: 2 History of Present Illness HPI Description: 11/06/16; this is a 61 year old man is here for review of 2 large open areas on the anterior and posterior medial Part of the lef t. He tells me that he has had significant edema in the left leg for 5 years and was told that this is lymphedema. His record in care everywhere states this is primary lymphedema.Marland Kitchen He is not a diabetic. 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 He has a prosthesis. The patient tells me that his problems started 2 weeks ago. I am able to see that he saw his primary doctor on 10/09/16 with an ulcer on the medial aspect of the left calf. He felt these were infected and he was started on doxycycline for 14 days. Subsequently he has been on Keflex and Bactrim.Marland Kitchen He has come to the attention of the [facility] and apparently he is on some list for compassionate release of an external compression pump which would be really quite helpful for this man. He states he is in a lot of pain and that the antibiotics of really not made much difference. He had apparently a ultrasound of the left calf which was negative for DVT although I don't see this result. The patient states he had an ultrasound done. Lab work from 8/17 showed a normal comprehensive metabolic panel and CBC with differential and x-ray of the left ankle was negative and I'm able to see this result. 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 I did review his duplex ultrasound from June 2018 this was negative for proximal vein DVT. His Veins were not well visualized because of swelling. The patient states that his pain is almost intolerable. He is on a compassionate release program for external compression pumps at [facility] and he has been communicating with them about this. He would still have a $1200 co-pay per what he is saying today. This would certainly be helpful. I couldn't see that he actually had arterial studies although even through the edema and the left foot is dorsalis pedis pulses are palpable his foot is warm and femoral pulses are palpable as well Electronic Signature(s) Signed: 11/21/2016 5:33:43 PM By: Baltazar Najjar MD Entered By: Baltazar Najjar on 11/21/2016 09:03:56 [name], [name] ([id number]) [name], [name] ([id number]) -------------------------------------------------------------------------------- Physical Exam Details Patient Name: [name] Date of Service: 11/21/2016 8:15 AM Medical Record Number: [id number] Patient Account Number: [id number] Date of Birth/Sex: 19-Jun-1955 (60 y.o. Male) Treating RN: Huel Coventry Primary Care Provider: Rolin Barry Other Clinician: Referring Provider: Rolin Barry Treating Provider/Extender: Maxwell Caul Weeks in Treatment: 2 Constitutional Sitting or standing Blood Pressure is within target range for patient.. Pulse regular and within target range for patient.Marland Kitchen Respirations  regular, non-labored and within target range.. Temperature is normal and within the target range for the patient.Marland Kitchen appears in no  distress. Eyes Conjunctivae have some redness. Mild discharge noted.Marland Kitchen Respiratory Respiratory effort is easy and symmetric bilaterally. Rate is normal at rest and on room air.. Shallower entry bilaterally no crackles or wheezes. Cardiovascular Heart rhythm and rate regular, without murmur or gallop. No murmurs no signs of pulmonary hypertension. Palpable femoral arteries. R Salas pedis pulses palpable even through the edema. Significant nonpitting edema in the left calf extending up the posterior aspect of his left thigh. Marked tenderness even in the thigh is concerning. He does not have obvious cellulitis. Gastrointestinal (GI) Abdomen is soft and non-distended without masses or tenderness. Bowel sounds active in all quadrants.. No liver or spleen enlargement or tenderness. No clear ascites. Lymphatic None palpable in the popliteal or inguinal area. Integumentary (Hair, Skin) There is erythema in the dorsal foot although this may represent uncontrolled weeping edema. Psychiatric No evidence of depression, anxiety, or agitation. Calm, cooperative, and communicative. Appropriate interactions and affect.. Notes Wound exam; the patient has large open areas anteriorly and medially. There is weeping edema coming through even epithelialized areas of the lower leg. Both the wound areas are covered in nonviable necrotic material although he is too uncomfortable for me to even consider mechanical debridement. The open areas do not seem to have extended from last week although there are absolutely no better. The area of erythema on the left anterior foot is completely nontender nor is it warm I'm therefore doubtful that this represents cellulitis Electronic Signature(s) Signed: 11/21/2016 5:33:43 PM By: Baltazar Najjar MD Entered By: Baltazar Najjar on 11/21/2016 09:07:26 [name], [name] ([id number]) [name], [name]  ([id number]) -------------------------------------------------------------------------------- Physician Orders Details Patient Name: [name] Date of Service: 11/21/2016 8:15 AM Medical Record Number: [id number] Patient Account Number: [id number] Date of Birth/Sex: May 17, 1955 (61 y.o. Male) Treating RN: Huel Coventry Primary Care Provider: Rolin Barry Other Clinician: Referring Provider: Rolin Barry Treating Provider/Extender: Altamese Shiloh in Treatment: 2 Verbal / Phone Orders: No Diagnosis Coding ICD-10 Coding Code Description [phone] Non-pressure chronic ulcer of left calf limited to breakdown of skin I89.0 Lymphedema, not elsewhere classified Chronic venous hypertension (idiopathic) with ulcer and inflammation of left lower I87.332 extremity L03.116 Cellulitis of left lower limb Wound Cleansing Wound #1 Left,Anterior Lower Leg o Clean wound with Normal Saline. o Cleanse wound with mild soap and water Wound #2 Left,Posterior Lower Leg o Clean wound with Normal Saline. o Cleanse wound with mild soap and water Anesthetic Wound #1 Left,Anterior Lower Leg o Hurricaine Topical Anesthetic Spray applied to wound bed prior to debridement Wound #2 Left,Posterior Lower Leg o Hurricaine Topical Anesthetic Spray applied to wound bed prior to debridement Skin Barriers/Peri-Wound Care Wound #1 Left,Anterior Lower Leg o Barrier cream Wound #2 Left,Posterior Lower Leg o Barrier cream Primary Wound Dressing Wound #1 Left,Anterior Lower Leg o Aquacel Ag Wound #2 Left,Posterior Lower Leg [name], [name] ([id number]) o Aquacel Ag Secondary Dressing Wound #1 Left,Anterior Lower Leg o ABD pad o XtraSorb Wound #2 Left,Posterior Lower Leg o ABD pad o XtraSorb Dressing Change Frequency Wound #1 Left,Anterior Lower Leg o Change Dressing Monday, Wednesday, Friday - HHRN to change 2 times a week and pt comes to wound care one time a week. More  often if needed due to excessive drainage. o Other: Wound #2 Left,Posterior Lower Leg o Change Dressing Monday, Wednesday, Friday - HHRN to change 2 times a week and pt comes to wound care  one time a week. More often if needed due to excessive drainage. o Other: Follow-up Appointments Wound #1 Left,Anterior Lower Leg o Return Appointment in 1 week. o Nurse Visit as needed Wound #2 Left,Posterior Lower Leg o Return Appointment in 1 week. o Nurse Visit as needed Edema Control Wound #1 Left,Anterior Lower Leg o 4-Layer Compression System - Left Lower Extremity Wound #2 Left,Posterior Lower Leg o 4-Layer Compression System - Left Lower Extremity Additional Orders / Instructions Wound #1 Left,Anterior Lower Leg o Increase protein intake. Wound #2 Left,Posterior Lower Leg o Increase protein intake. Home Health Wound #1 Left,Anterior Lower Leg HAGEMANN, Ziyan ([id number]) o Continue Home Health Visits - [facility] o Home Health Nurse may visit PRN to address patientos wound care needs. o FACE TO FACE ENCOUNTER: MEDICARE and MEDICAID PATIENTS: I certify that this patient is under my care and that I had a face-to-face encounter that meets the physician face-to-face encounter requirements with this patient on this date. The encounter with the patient was in whole or in part for the following MEDICAL CONDITION: (primary reason for Home Healthcare) MEDICAL NECESSITY: I certify, that based on my findings, NURSING services are a medically necessary home health service. HOME BOUND STATUS: I certify that my clinical findings support that this patient is homebound (i.e., Due to illness or injury, pt requires aid of supportive devices such as crutches, cane, wheelchairs, walkers, the use of special transportation or the assistance of another person to leave their place of residence. There is a normal inability to leave the home and doing so requires considerable and  taxing effort. Other absences are for medical reasons / religious services and are infrequent or of short duration when for other reasons). o If current dressing causes regression in wound condition, may D/C ordered dressing product/s and apply Normal Saline Moist Dressing daily until next Wound Healing Center / Other MD appointment. Notify Wound Healing Center of regression in wound condition at [phone]. o Please direct any NON-WOUND related issues/requests for orders to patient's Primary Care Physician Wound #2 Left,Posterior Lower Leg o Continue Home Health Visits - [facility] o Home Health Nurse may visit PRN to address patientos wound care needs. o FACE TO FACE ENCOUNTER: MEDICARE and MEDICAID PATIENTS: I certify that this patient is under my care and that I had a face-to-face encounter that meets the physician face-to-face encounter requirements with this patient on this date. The encounter with the patient was in whole or in part for the following MEDICAL CONDITION: (primary reason for Home Healthcare) MEDICAL NECESSITY: I certify, that based on my findings, NURSING services are a medically necessary home health service. HOME BOUND STATUS: I certify that my clinical findings support that this patient is homebound (i.e., Due to illness or injury, pt requires aid of supportive devices such as crutches, cane, wheelchairs, walkers, the use of special transportation or the assistance of another person to leave their place of residence. There is a normal inability to leave the home and doing so requires considerable and taxing effort. Other absences are for medical reasons / religious services and are infrequent or of short duration when for other reasons). o If current dressing causes regression in wound condition, may D/C ordered dressing product/s and apply Normal Saline Moist Dressing daily until next Wound Healing Center / Other MD appointment. Notify Wound  Healing Center of regression in wound condition at [phone]. o Please direct any NON-WOUND related issues/requests for orders to patient's Primary Care Physician Medications-please add to medication list. Wound #1  Left,Anterior Lower Leg o Topical Antibiotic Wound #2 Left,Posterior Lower Leg o Topical Antibiotic [employer] [name], [name] ([id number]) o Venous US for DVT - [facility] as work-in Patient Medications Allergies: bacitracin, vancomycin Notifications Medication Indication Start End triamcinolone acetonide 11/21/2016 DOSE topical 0.1 % cream - cream topical daily with wound dressing changes Electronic Signature(s) Signed: 11/21/2016 1:00:42 PM By: Baltazar Najjar MD Entered By: Baltazar Najjar on 11/21/2016 13:00:42 Lickteig, Bexton ([id number]) -------------------------------------------------------------------------------- Problem List Details Patient Name: [name] Date of Service: 11/21/2016 8:15 AM Medical Record Number: [id number] Patient Account Number: [id number] Date of Birth/Sex: 11-22-1955 (61 y.o. Male) Treating RN: Huel Coventry Primary Care Provider: Rolin Barry Other Clinician: Referring Provider: Rolin Barry Treating Provider/Extender: Maxwell Caul Weeks in Treatment: 2 Active Problems ICD-10 Encounter Code Description Active Date Diagnosis L97.221 Non-pressure chronic ulcer of left calf limited to 11/06/2016 Yes breakdown of skin I89.0 Lymphedema, not elsewhere classified 11/06/2016 Yes I87.332 Chronic venous hypertension (idiopathic) with ulcer and 11/06/2016 Yes inflammation of left lower extremity L03.116 Cellulitis of left lower limb 11/06/2016 Yes Inactive Problems Resolved Problems Electronic Signature(s) Signed: 11/21/2016 5:33:43 PM By: Baltazar Najjar MD Entered By: Baltazar Najjar on 11/21/2016 09:01:29 [name], [name] ([id number]) -------------------------------------------------------------------------------- Progress Note  Details Patient Name: [name] Date of Service: 11/21/2016 8:15 AM Medical Record Number: [id number] Patient Account Number: [id number] Date of Birth/Sex: 01/30/1956 (61 y.o. Male) Treating RN: Huel Coventry Primary Care Provider: Rolin Barry Other Clinician: Referring Provider: Rolin Barry Treating Provider/Extender: Maxwell Caul Weeks in Treatment: 2 Subjective History of Present Illness (HPI) 11/06/16; this is a 61 year old man is here for review of 2 large open areas on the anterior and posterior medial Part of the lef t. He tells me that he has had significant edema in the left leg for 5 years and was told that this is lymphedema. His record in care everywhere states this is primary lymphedema.Marland Kitchen He is not a diabetic. 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 He has a prosthesis. The patient tells me that his problems started 2 weeks ago. I am able to see that he saw his primary doctor on 10/09/16 with an ulcer on the medial aspect of the left calf. He felt these were infected and he was started on doxycycline for 14 days. Subsequently he has been on Keflex and Bactrim.Marland Kitchen He has come to the attention of the [facility] and apparently he is on some list for compassionate release of an external compression pump which would be really quite helpful for this man. He states he is in a lot of pain and that the antibiotics of really not made much difference. He had apparently a ultrasound of the left calf which was negative for DVT although I don't see this result. The patient states he had an ultrasound done. Lab work from 8/17 showed a normal comprehensive metabolic panel and CBC with differential and x-ray of the left ankle was negative and I'm able to see this result. The patient has a history of a left total knee replacement, recurrent DVTs on Coumadin, obstructive sleep apnea, left Achilles tendon  repair in 2004 11/21/16; patient arrives today complaining of a lot of pain in his wounds on his left lower leg also pain in the leg itself extending up into his thigh. 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 I did review his duplex ultrasound  from June 2018 this was negative for proximal vein DVT. His Veins were not well visualized because of swelling. The patient states that his pain is almost intolerable. He is on a compassionate release program for external compression pumps at [facility] and he has been communicating with them about this. He would still have a $1200 co-pay per what he is saying today. This would certainly be helpful. I couldn't see that he actually had arterial studies although even through the edema and the left foot is dorsalis pedis pulses are palpable his foot is warm and femoral pulses are palpable as well Objective [name], [name] ([id number]) Constitutional Sitting or standing Blood Pressure is within target range for patient.. Pulse regular and within target range for patient.Marland Kitchen Respirations regular, non-labored and within target range.. Temperature is normal and within the target range for the patient.Marland Kitchen appears in no distress. Vitals Time Taken: 8:12 AM, Height: 77 in, Weight: 340 lbs, BMI: 40.3, Temperature: 98.2 F, Pulse: 61 bpm, Respiratory Rate: 20 breaths/min, Blood Pressure: 139/77 mmHg. Eyes Conjunctivae have some redness. Mild discharge noted.Marland Kitchen Respiratory Respiratory effort is easy and symmetric bilaterally. Rate is normal at rest and on room air.. Shallower entry bilaterally no crackles or wheezes. Cardiovascular Heart rhythm and rate regular, without murmur or gallop. No murmurs no signs of pulmonary hypertension. Palpable femoral arteries. R Salas pedis pulses palpable even through the edema. Significant nonpitting edema in the left calf extending up the posterior  aspect of his left thigh. Marked tenderness even in the thigh is concerning. He does not have obvious cellulitis. Gastrointestinal (GI) Abdomen is soft and non-distended without masses or tenderness. Bowel sounds active in all quadrants.. No liver or spleen enlargement or tenderness. No clear ascites. Lymphatic None palpable in the popliteal or inguinal area. Psychiatric No evidence of depression, anxiety, or agitation. Calm, cooperative, and communicative. Appropriate interactions and affect.. General Notes: Wound exam; the patient has large open areas anteriorly and medially. There is weeping edema coming through even epithelialized areas of the lower leg. Both the wound areas are covered in nonviable necrotic material although he is too uncomfortable for me to even consider mechanical debridement. The open areas do not seem to have extended from last week although there are absolutely no better. The area of erythema on the left anterior foot is completely nontender nor is it warm I'm therefore doubtful that this represents cellulitis Integumentary (Hair, Skin) There is erythema in the dorsal foot although this may represent uncontrolled weeping edema. Wound #1 status is Open. Original cause of wound was Gradually Appeared. The wound is located on the Left,Anterior Lower Leg. The wound measures 11cm length x 14cm width x 0.1cm depth; 120.951cm^2 area and 12.095cm^3 volume. Wound #2 status is Open. Original cause of wound was Gradually Appeared. The wound is located on the Left,Posterior Lower Leg. The wound measures 11cm length x 15cm width x 0.1cm depth; 129.591cm^2 [name], [name] ([id number]) area and 12.959cm^3 volume. Assessment Active Problems ICD-10 L97.221 - Non-pressure chronic ulcer of left calf limited to breakdown of skin I89.0 - Lymphedema, not elsewhere classified I87.332 - Chronic venous hypertension (idiopathic) with ulcer and inflammation of left lower extremity L03.116  - Cellulitis of left lower limb Plan Wound Cleansing: Wound #1 Left,Anterior Lower Leg: Clean wound with Normal Saline. Cleanse wound with mild soap and water Wound #2 Left,Posterior Lower Leg: Clean wound with Normal Saline. Cleanse wound with mild soap and water Anesthetic: Wound #1 Left,Anterior Lower Leg: Hurricaine Topical Anesthetic Spray applied to wound bed prior  to debridement Wound #2 Left,Posterior Lower Leg: Hurricaine Topical Anesthetic Spray applied to wound bed prior to debridement Skin Barriers/Peri-Wound Care: Wound #1 Left,Anterior Lower Leg: Barrier cream Wound #2 Left,Posterior Lower Leg: Barrier cream Primary Wound Dressing: Wound #1 Left,Anterior Lower Leg: Aquacel Ag Wound #2 Left,Posterior Lower Leg: Aquacel Ag Secondary Dressing: Wound #1 Left,Anterior Lower Leg: ABD pad XtraSorb Wound #2 Left,Posterior Lower Leg: ABD pad [name], [name] ([id number]) XtraSorb Dressing Change Frequency: Wound #1 Left,Anterior Lower Leg: Change Dressing Monday, Wednesday, Friday - HHRN to change 2 times a week and pt comes to wound care one time a week. More often if needed due to excessive drainage. Other: Wound #2 Left,Posterior Lower Leg: Change Dressing Monday, Wednesday, Friday - HHRN to change 2 times a week and pt comes to wound care one time a week. More often if needed due to excessive drainage. Other: Follow-up Appointments: Wound #1 Left,Anterior Lower Leg: Return Appointment in 1 week. Nurse Visit as needed Wound #2 Left,Posterior Lower Leg: Return Appointment in 1 week. Nurse Visit as needed Edema Control: Wound #1 Left,Anterior Lower Leg: 4-Layer Compression System - Left Lower Extremity Wound #2 Left,Posterior Lower Leg: 4-Layer Compression System - Left Lower Extremity Additional Orders / Instructions: Wound #1 Left,Anterior Lower Leg: Increase protein intake. Wound #2 Left,Posterior Lower Leg: Increase protein intake. Home Health: Wound  #1 Left,Anterior Lower Leg: Continue Home Health Visits - [facility] Nurse may visit PRN to address patient s wound care needs. FACE TO FACE ENCOUNTER: MEDICARE and MEDICAID PATIENTS: I certify that this patient is under my care and that I had a face-to-face encounter that meets the physician face-to-face encounter requirements with this patient on this date. The encounter with the patient was in whole or in part for the following MEDICAL CONDITION: (primary reason for Home Healthcare) MEDICAL NECESSITY: I certify, that based on my findings, NURSING services are a medically necessary home health service. HOME BOUND STATUS: I certify that my clinical findings support that this patient is homebound (i.e., Due to illness or injury, pt requires aid of supportive devices such as crutches, cane, wheelchairs, walkers, the use of special transportation or the assistance of another person to leave their place of residence. There is a normal inability to leave the home and doing so requires considerable and taxing effort. Other absences are for medical reasons / religious services and are infrequent or of short duration when for other reasons). If current dressing causes regression in wound condition, may D/C ordered dressing product/s and apply Normal Saline Moist Dressing daily until next Wound Healing Center / Other MD appointment. Notify Wound Healing Center of regression in wound condition at [phone]. Please direct any NON-WOUND related issues/requests for orders to patient's Primary Care Physician Wound #2 Left,Posterior Lower Leg: Continue Home Health Visits - [facility] Nurse may visit PRN to address patient s wound care needs. FACE TO FACE ENCOUNTER: MEDICARE and MEDICAID PATIENTS: I certify that this patient is under my care and that I had a face-to-face encounter that meets the physician face-to-face encounter requirements with this patient on this date.  The encounter with the patient was in whole or in part for the [name], [name] ([id number]) following MEDICAL CONDITION: (primary reason for Home Healthcare) MEDICAL NECESSITY: I certify, that based on my findings, NURSING services are a medically necessary home health service. HOME BOUND STATUS: I certify that my clinical findings support that this patient is homebound (i.e., Due to illness or injury, pt requires aid of supportive devices  such as crutches, cane, wheelchairs, walkers, the use of special transportation or the assistance of another person to leave their place of residence. There is a normal inability to leave the home and doing so requires considerable and taxing effort. Other absences are for medical reasons / religious services and are infrequent or of short duration when for other reasons). If current dressing causes regression in wound condition, may D/C ordered dressing product/s and apply Normal Saline Moist Dressing daily until next Wound Healing Center / Other MD appointment. Notify [facility]aling Center of regression in wound condition at [phone]. Please direct any NON-WOUND related issues/requests for orders to patient's Primary Care Physician Medications-please add to medication list.: Wound #1 Left,Anterior Lower Leg: Topical Antibiotic Wound #2 Left,Posterior Lower Leg: Topical Antibiotic ordered were: Venous US for DVT - [facility] as work-in The following medication(s) was prescribed: triamcinolone acetonide topical 0.1 % cream cream topical daily with wound dressing changes starting 11/21/2016 #1 unfortunately I'm going to need to rule out it left thigh DVT #2 he has had this done in 2018 in June and previously in 2015. He is on Coumadin but is unaware of his current INR. #3 if this is negative I wonder about imaging is pelvic veins or more proximal central veins #4 severe edema in the left leg which is nonpitting and lymphedema. Ultimately this is going to  need compression pumps #5 he seems to be tolerating the 4 layer compression we put on i.e. not causing any more pain than usual according to the patient today. #6 continue with the alginate based dressings and 4 layer compression. He has [facility]. Electronic Signature(s) Signed: 11/21/2016 1:01:10 PM By: Baltazar Najjar MD Entered By: Baltazar Najjar on 11/21/2016 13:01:10 [name], [name] ([id number]) -------------------------------------------------------------------------------- SuperBill Details Patient Name: [name] Serene Jancarlos Date of Service: 11/21/2016 Medical Record Number: [id number] Patient Account Number: [id number] Date of Birth/Sex: 12/01/1955 (61 y.o. 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 L03.116 Cellulitis of left lower limb Facility Procedures CPT4 Code: 69629528 Description: 99213 - WOUND CARE VISIT-LEV 3 EST PT Modifier: Quantity: 1 Physician Procedures CPT4: Description Modifier Quantity Code [id number] 99214 - WC PHYS LEVEL 4 - EST PT 1 ICD-10 Description Diagnosis L97.221 Non-pressure chronic ulcer of left calf limited to breakdown of skin I89.0 Lymphedema, not elsewhere classified I87.332 Chronic  venous hypertension (idiopathic) with ulcer and inflammation of left lower extremity Electronic Signature(s) Signed: 11/21/2016 5:33:43 PM By: Baltazar Najjar MD Signed: 11/21/2016 6:22:17 PM By: Elliot Gurney, BSN, RN, CWS, Kim RN, BSN Entered By: Elliot Gurney, BSN, RN, CWS, Kim on 11/21/2016 11:56:31

## 2016-11-23 NOTE — Progress Notes (Signed)
[name], [name] ([id number]) Visit Report for 11/21/2016 Arrival Information Details Patient Name: [name], [name] Date of Service: 11/21/2016 8:15 AM Medical Record Number: [id number] Patient Account Number: [id number] Date of Birth/Sex: Aug 06, 1955 (61 y.o. Male) Treating RN: [name] Primary Care [name]: [name] Other Clinician: Referring [name]: [name] Treating [name]/Extender: [name] in Treatment: 2 Visit Information History Since Last Visit Added or deleted any medications: No Patient Arrived: Crutches Any new allergies or adverse reactions: No Arrival Time: 08:10 Had a fall or experienced change in No Accompanied By: wife, [name] activities of daily living that may affect Transfer Assistance: None risk of falls: Patient Identification Verified: Yes Signs or symptoms of abuse/neglect since last No Secondary Verification Process Yes visito Completed: Hospitalized since last visit: No Patient Requires Transmission- No Has Dressing in Place as Prescribed: Yes Based Precautions: Pain Present Now: No Patient Has Alerts: Yes Patient Alerts: Patient on Blood Thinner warfarin NO ABI d/t PAIN Electronic Signature([name]) Signed: 11/21/2016 6:22:17 PM By: [name], BSN, RN, CWS, Kim RN, BSN Entered By: [name], BSN, RN, CWS, [name] on 11/21/2016 08:11:32 [name], [name] ([id number]) -------------------------------------------------------------------------------- Clinic Level of Care Assessment Details Patient Name: [name], [name] Date of Service: 11/21/2016 8:15 AM Medical Record Number: [id number] Patient Account Number: [id number] Date of Birth/Sex: June 24, 1955 (61 y.o. Male) Treating RN: [name] Primary Care [name]: [name] Other Clinician: Referring [name]: [name] Treating [name]/Extender: [name] [name] in Treatment: 2 Clinic Level of Care Assessment Items TOOL 4 Quantity Score  - Use when only an EandM is performed on  FOLLOW-UP visit 0 ASSESSMENTS - Nursing Assessment / Reassessment  - Reassessment of Co-morbidities (includes updates in patient status) 0 X - Reassessment of Adherence to Treatment Plan 1 5 ASSESSMENTS - Wound and Skin Assessment / Reassessment  - Simple Wound Assessment / Reassessment - one wound 0 X - Complex Wound Assessment / Reassessment - multiple wounds 1 5  - Dermatologic / Skin Assessment (not related to wound area) 0 ASSESSMENTS - Focused Assessment  - Circumferential Edema Measurements - multi extremities 0  - Nutritional Assessment / Counseling / Intervention 0  - Lower Extremity Assessment (monofilament, tuning fork, pulses) 0  - Peripheral Arterial Disease Assessment (using hand held doppler) 0 ASSESSMENTS - Ostomy and/or Continence Assessment and Care  - Incontinence Assessment and Management 0  - Ostomy Care Assessment and Management (repouching, etc.) 0 PROCESS - Coordination of Care  - Simple Patient / Family Education for ongoing care 0 X - Complex (extensive) Patient / Family Education for ongoing care 1 20 X - Staff obtains Chiropractor, Records, Test Results / Process Orders 1 10  - Staff telephones HHA, Nursing Homes / Clarify orders / etc 0  - Routine Transfer to another Facility (non-emergent condition) 0 [name], [name] ([id number])  - Routine Hospital Admission (non-emergent condition) 0  - New Admissions / Manufacturing engineer / Ordering NPWT, Apligraf, etc. 0  - Emergency Hospital Admission (emergent condition) 0 X - Simple Discharge Coordination 1 10  - Complex (extensive) Discharge Coordination 0 PROCESS - Special Needs  - Pediatric / Minor Patient Management 0  - Isolation Patient Management 0  - Hearing / Language / Visual special needs 0  - Assessment of Community assistance (transportation, D/C planning, etc.) 0  - Additional assistance / Altered mentation 0  - Support Surface([name]) Assessment (bed, cushion,  seat, etc.) 0 INTERVENTIONS - Wound Cleansing / Measurement  - Simple Wound Cleansing - one wound 0 X - Complex Wound Cleansing - multiple  wounds 1 5 X - Wound Imaging (photographs - any number of wounds) 1 5  - Wound Tracing (instead of photographs) 0  - Simple Wound Measurement - one wound 0 X - Complex Wound Measurement - multiple wounds 1 5 INTERVENTIONS - Wound Dressings  - Small Wound Dressing one or multiple wounds 0  - Medium Wound Dressing one or multiple wounds 0 X - Large Wound Dressing one or multiple wounds 1 20  - Application of Medications - topical 0  - Application of Medications - injection 0 INTERVENTIONS - Miscellaneous  - External ear exam 0 [name], [name] ([id number])  - Specimen Collection (cultures, biopsies, blood, body fluids, etc.) 0  - Specimen([name]) / Culture([name]) sent or taken to Lab for analysis 0  - Patient Transfer (multiple staff / [name] / Similar devices) 0  - Simple Staple / Suture removal (25 or less) 0  - Complex Staple / Suture removal (26 or more) 0  - Hypo / Hyperglycemic Management (close monitor of Blood Glucose) 0  - Ankle / Brachial Index (ABI) - do not check if billed separately 0 X - Vital Signs 1 5 Has the patient been seen at the hospital within the last three years: Yes Total Score: 90 Level Of Care: New/Established - Level 3 Electronic Signature([name]) Signed: 11/21/2016 6:22:17 PM By: [name], BSN, RN, CWS, Kim RN, BSN Entered By: [name], BSN, RN, CWS, [name] on 11/21/2016 09:16:57 [name], [name] ([id number]) -------------------------------------------------------------------------------- Encounter Discharge Information Details Patient Name: [name], [name] Date of Service: 11/21/2016 8:15 AM Medical Record Number: [id number] Patient Account Number: [id number] Date of Birth/Sex: 1955/05/04 (61 y.o. Male) Treating RN: [name] Primary Care Bellarae Lizer: [name] Other Clinician: Referring Laren Whaling: [name] Treating Valaree Fresquez/Extender: [name] [place] in Treatment: 2 Encounter Discharge Information Items Discharge Pain Level: 0 Discharge Condition: Stable Ambulatory Status: Ambulatory Discharge Destination: Home Transportation: Private Auto Accompanied By: self Schedule Follow-up Appointment: Yes Medication Reconciliation completed and provided to Patient/Care Yes Darlyne Schmiesing: Provided on Clinical Summary of Care: 11/21/2016 Form Type Recipient Paper Patient EG Electronic Signature([name]) Signed: 11/22/2016 9:09:16 AM By: Gwenlyn Perking Entered By: Gwenlyn Perking on 11/21/2016 09:52:39 [place], [name] ([id number]) -------------------------------------------------------------------------------- Lower Extremity Assessment Details Patient Name: [name], Osten Date of Service: 11/21/2016 8:15 AM Medical Record Number: [id number] Patient Account Number: [id number] Date of Birth/Sex: [birth year] (61 y.o. Male) Treating RN: [name] Primary Care Melroy Bougher: [name] Other Clinician: Referring Parsa Rickett: [name] Treating Ermelinda Eckert/Extender: Maxwell Caul Weeks in Treatment: 2 Edema Assessment Assessed: [Left: No] [Right: No] E[Left: dema] [Right: :] Calf Left: Right: Point of Measurement: 36 cm From Medial Instep 64 cm cm Ankle Left: Right: Point of Measurement: 13 cm From Medial Instep 43.2 cm cm Vascular Assessment Pulses: Dorsalis Pedis Palpable: [Left:No] Doppler Audible: [Left:Yes] Posterior Tibial Palpable: [Left:No] Doppler Audible: [Left:Yes] Extremity colors, hair growth, and conditions: Extremity Color: [Left:Hyperpigmented] Hair Growth on Extremity: [Left:Yes] Temperature of Extremity: [Left:Warm] Capillary Refill: [Left:> 3 seconds] Toe Nail Assessment Left: Right: Thick: Yes Discolored: Yes Deformed: Yes Improper Length and Hygiene: Yes Electronic Signature([name]) Signed: 11/21/2016 6:22:17 PM By: [name], BSN, RN, CWS, Kim RN, BSN Hobart, Chananya  ([id number]) Entered By: [name], BSN, RN, CWS, [name] on 11/21/2016 08:28:32 Kramme, Deakon ([id number]) -------------------------------------------------------------------------------- Multi Wound Chart Details Patient Name: [name] Nikolus Date of Service: 11/21/2016 8:15 AM Medical Record Number: [id number] Patient Account Number: [id number] Date of Birth/Sex: 11-23-1955 (61 y.o. Male) Treating RN: [name] Primary Care Jeffrey Graefe: [name] Other Clinician: Referring Kadeja Granada: [name] Treating Akire Rennert/Extender: Baltazar Najjar  G Weeks in Treatment: 2 Vital Signs Height(in): 77 Pulse(bpm): 61 Weight(lbs): 340 Blood Pressure 139/77 (mmHg): Body Mass Index(BMI): 40 Temperature(F): 98.2 Respiratory Rate 20 (breaths/min): Photos: [N/A:N/A] Wound Location: Left, Anterior Lower Leg Left, Posterior Lower Leg N/A Wounding Event: Gradually Appeared Gradually Appeared N/A Primary Etiology: Lymphedema Lymphedema N/A Date Acquired: 10/23/2016 10/23/2016 N/A Weeks of Treatment: 2 2 N/A Wound Status: Open Open N/A Measurements L x W x D 11x14x0.1 11x15x0.1 N/A (cm) Area (cm) : 120.951 129.591 N/A Volume (cm) : 12.095 12.959 N/A % Reduction in Area: -92.50% -5.80% N/A % Reduction in Volume: -92.50% -5.80% N/A Classification: Partial Thickness Partial Thickness N/A Periwound Skin Texture: No Abnormalities Noted No Abnormalities Noted N/A Periwound Skin No Abnormalities Noted No Abnormalities Noted N/A Moisture: Periwound Skin Color: No Abnormalities Noted No Abnormalities Noted N/A Tenderness on No No N/A Palpation: Treatment Notes Electronic Signature([name]) Signed: 11/21/2016 5:33:43 PM By: Baltazar Najjar MD Oates, [name] ([id number]) Entered By: Baltazar Najjar on 11/21/2016 09:01:36 Moehring, Shahab ([id number]) -------------------------------------------------------------------------------- Multi-Disciplinary Care Plan Details Patient Name: [name], [name] Date of Service:  11/21/2016 8:15 AM Medical Record Number: [id number] Patient Account Number: [id number] Date of Birth/Sex: 20-Dec-1955 (62 y.o. Male) Treating RN: [name] Primary Care Marializ Ferrebee: [name] Other Clinician: Referring Jamaurion Slemmer: [name] Treating Rifka Ramey/Extender: [name] Dellroy in Treatment: 2 Active Inactive ` Orientation to the Wound Care Program Nursing Diagnoses: Knowledge deficit related to the wound healing center program Goals: Patient/caregiver will verbalize understanding of the Wound Healing Center Program Date Initiated: 11/06/2016 Target Resolution Date: 12/08/2016 Goal Status: Active Interventions: Provide education on orientation to the wound center Notes: ` Pain, Acute or Chronic Nursing Diagnoses: Pain, acute or chronic: actual or potential Potential alteration in comfort, pain Goals: Patient/caregiver will verbalize adequate pain control between visits Date Initiated: 11/06/2016 Target Resolution Date: 03/09/2017 Goal Status: Active Interventions: Complete pain assessment as per visit requirements Encourage patient to take pain medications as prescribed Notes: ` Wound/Skin Impairment Nursing Diagnoses: Jeanpaul, Biehl [name] ([id number]) Impaired tissue integrity Knowledge deficit related to ulceration/compromised skin integrity Goals: Ulcer/skin breakdown will have a volume reduction of 80% by week 12 Date Initiated: 11/06/2016 Target Resolution Date: 03/02/2017 Goal Status: Active Interventions: Assess patient/caregiver ability to perform ulcer/skin care regimen upon admission and as needed Assess ulceration([name]) every visit Notes: Electronic Signature([name]) Signed: 11/21/2016 6:22:17 PM By: [name], BSN, RN, CWS, Kim RN, BSN Entered By: [name], BSN, RN, CWS, [name] on 11/21/2016 08:32:46 Golebiewski, [name] ([id number]) -------------------------------------------------------------------------------- Pain Assessment Details Patient Name: [name] [name] Date of  Service: 11/21/2016 8:15 AM Medical Record Number: [id number] Patient Account Number: [id number] Date of Birth/Sex: 25-Nov-1955 (61 y.o. Male) Treating RN: [name] Primary Care Jacquita Mulhearn: [name] Other Clinician: Referring Maquita Sandoval: [name] Treating Calvary Difranco/Extender: Maxwell Caul Weeks in Treatment: 2 Active Problems Location of Pain Severity and Description of Pain Patient Has Paino Yes Site Locations Pain Location: Pain in Ulcers Rate the pain. Current Pain Level: 10 Pain Management and Medication Current Pain Management: Goals for Pain Management Topical or injectable lidocaine is offered to patient for acute pain when surgical debridement is performed. If needed, Patient is instructed to use over the counter pain medication for the following 24-48 hours after debridement. Wound care MDs do not prescribed pain medications. Patient has chronic pain or uncontrolled pain. Patient has been instructed to make an appointment with their Primary Care Physician for pain management. Electronic Signature([name]) Signed: 11/21/2016 6:22:17 PM By: [name], BSN, RN, CWS, Kim RN, BSN Entered By: [name], BSN, RN, CWS, [name] on  11/21/2016 08:11:47 Beazer, Davan ([id number]) -------------------------------------------------------------------------------- Patient/Caregiver Education Details Patient Name: [name], [name] [name] Date of Service: 11/21/2016 8:15 AM Medical Record Number: [id number] Patient Account Number: [id number] Date of Birth/Gender: 15-Mar-1955 (61 y.o. Male) Treating RN: [name] Primary Care Physician: [name] Other Clinician: Referring Physician: Rolin [name] Treating Physician/Extender: [name] Burdette in Treatment: 2 Education Assessment Education Provided To: Patient Education Topics Provided Wound/Skin Impairment: Handouts: Caring for Your Ulcer, Other: Advanced HH Methods: Demonstration, Explain/Verbal Responses: State content correctly Electronic  Signature([name]) Signed: 11/21/2016 6:22:17 PM By: [name], BSN, RN, CWS, Kim RN, BSN Entered By: [name], BSN, RN, CWS, [name] on 11/21/2016 09:19:02 Daily, [name] ([id number]) -------------------------------------------------------------------------------- Wound Assessment Details Patient Name: [name], Lemarcus Date of Service: 11/21/2016 8:15 AM Medical Record Number: [id number] Patient Account Number: [id number] Date of Birth/Sex: February 19, 1956 (61 y.o. Male) Treating RN: [name] Primary Care Santo Zahradnik: [name] Other Clinician: Referring Zane Samson: [name] Treating Khyli Swaim/Extender: Maxwell Caul Weeks in Treatment: 2 Wound Status Wound Number: 1 Primary Etiology: Lymphedema Wound Location: Left, Anterior Lower Leg Wound Status: Open Wounding Event: Gradually Appeared Date Acquired: 10/23/2016 Weeks Of Treatment: 2 Clustered Wound: No Photos Photo Uploaded By: [name], BSN, RN, CWS, [name] on 11/21/2016 08:33:46 Wound Measurements Length: (cm) 11 Width: (cm) 14 Depth: (cm) 0.1 Area: (cm) 120.951 Volume: (cm) 12.095 % Reduction in Area: -92.5% % Reduction in Volume: -92.5% Wound Description Classification: Partial Thickness Periwound Skin Texture Texture Color No Abnormalities Noted: No No Abnormalities Noted: No Moisture No Abnormalities Noted: No Treatment Notes Wound #1 (Left, Anterior Lower Leg) 1. Cleansed with: Clean wound with Normal Saline 2. Anesthetic Hemminger, Azaria ([id number]) Hurricaine Topical Anesthetic Spray 3. Peri-wound Care: Barrier cream 4. Dressing Applied: Aquacel Ag 5. Secondary Dressing Applied Kerlix/Conform Notes Xtra sorb Electronic Signature([name]) Signed: 11/21/2016 6:22:17 PM By: [name], BSN, RN, CWS, Kim RN, BSN Entered By: [name], BSN, RN, CWS, [name] on 11/21/2016 24:40:10 VASCO, Jovon ([id number]) -------------------------------------------------------------------------------- Wound Assessment Details Patient Name: [name], Rolla Date  of Service: 11/21/2016 8:15 AM Medical Record Number: [id number] Patient Account Number: [id number] Date of Birth/Sex: May 31, 1955 (61 y.o. Male) Treating RN: [name] Primary Care Rayansh Herbst: [name] Other Clinician: Referring Jerrye Seebeck: [name] Treating Teja Judice/Extender: Maxwell Caul Weeks in Treatment: 2 Wound Status Wound Number: 2 Primary Etiology: Lymphedema Wound Location: Left, Posterior Lower Leg Wound Status: Open Wounding Event: Gradually Appeared Date Acquired: 10/23/2016 Weeks Of Treatment: 2 Clustered Wound: No Photos Photo Uploaded By: [name], BSN, RN, CWS, [name] on 11/21/2016 08:33:46 Wound Measurements Length: (cm) 11 Width: (cm) 15 Depth: (cm) 0.1 Area: (cm) 129.591 Volume: (cm) 12.959 % Reduction in Area: -5.8% % Reduction in Volume: -5.8% Wound Description Classification: Partial Thickness Periwound Skin Texture Texture Color No Abnormalities Noted: No No Abnormalities Noted: No Moisture No Abnormalities Noted: No Treatment Notes Wound #2 (Left, Posterior Lower Leg) 1. Cleansed with: Clean wound with Normal Saline 2. Anesthetic Deguire, Areli ([id number]) Hurricaine Topical Anesthetic Spray 3. Peri-wound Care: Barrier cream 4. Dressing Applied: Aquacel Ag 5. Secondary Dressing Applied Kerlix/Conform Notes Xtra sorb Electronic Signature([name]) Signed: 11/21/2016 6:22:17 PM By: [name], BSN, RN, CWS, Kim RN, BSN Entered By: [name], BSN, RN, CWS, [name] on 11/21/2016 29:51:88 LINDHOLM, Thorsten ([id number]) -------------------------------------------------------------------------------- Vitals Details Patient Name: [name] Orin Date of Service: 11/21/2016 8:15 AM Medical Record Number: [id number] Patient Account Number: [id number] Date of Birth/Sex: 04-21-1955 (61 y.o. Male) Treating RN: [name] Primary Care Arlyce Circle: [name] Other Clinician: Referring Lorell Thibodaux: [name] Treating Scarleth Brame/Extender: Maxwell Caul Weeks in  Treatment: 2 Vital Signs  Time Taken: 08:12 Temperature (F): 98.2 Height (in): 77 Pulse (bpm): 61 Weight (lbs): 340 Respiratory Rate (breaths/min): 20 Body Mass Index (BMI): 40.3 Blood Pressure (mmHg): 139/77 Reference Range: 80 - 120 mg / dl Electronic Signature([name]) Signed: 11/21/2016 6:22:17 PM By: [name], BSN, RN, CWS, Kim RN, BSN Entered By: [name], BSN, RN, CWS, [name] on 11/21/2016 81:19:14

## 2016-11-28 ENCOUNTER — Encounter: Payer: Medicare Other | Admitting: Internal Medicine

## 2016-11-28 DIAGNOSIS — I87332 Chronic venous hypertension (idiopathic) with ulcer and inflammation of left lower extremity: Secondary | ICD-10-CM | POA: Diagnosis not present

## 2016-11-29 NOTE — Progress Notes (Signed)
[name], Hovater [name] ([id number]) Visit Report for 11/28/2016 Arrival Information Details Patient Name: [name], [name] Date of Service: 11/28/2016 9:15 AM Medical Record Number: [id number] Patient Account Number: [id number] Date of Birth/Sex: May 16, 1955 (61 y.o. Male) Treating RN: Huel Coventry Primary Care Tonna Palazzi: Rolin Barry Other Clinician: Referring Blue Ruggerio: Rolin Barry Treating Emmalynn Pinkham/Extender: Altamese [place] in Treatment: 3 Visit Information History Since Last Visit Any new allergies or adverse reactions: No Patient Arrived: Crutches Had a fall or experienced change in No Arrival Time: 08:59 activities of daily living that may affect Accompanied By: wife risk of falls: Transfer Assistance: None Signs or symptoms of abuse/neglect since last No Patient Identification Verified: Yes visito Secondary Verification Process Yes Hospitalized since last visit: No Completed: Has Dressing in Place as Prescribed: Yes Patient Requires Transmission- No Has Compression in Place as Prescribed: Yes Based Precautions: Pain Present Now: Yes Patient Has Alerts: Yes Patient Alerts: Patient on Blood Thinner warfarin NO ABI d/t PAIN Electronic Signature(s) Signed: 11/28/2016 1:35:33 PM By: Elliot Gurney, BSN, RN, CWS, Kim RN, BSN Entered By: Elliot Gurney, BSN, RN, CWS, Kim on 11/28/2016 08:59:44 Gargan, May ([id number]) -------------------------------------------------------------------------------- Compression Therapy Details Patient Name: [name] Date of Service: 11/28/2016 9:15 AM Medical Record Number: [id number] Patient Account Number: [id number] Date of Birth/Sex: 03-Jun-1955 (61 y.o. Male) Treating RN: Huel Coventry Primary Care Marcel Sorter: Rolin Barry Other Clinician: Referring Yohana Bartha: Rolin Barry Treating Martavia Tye/Extender: Maxwell Caul Weeks in Treatment: 3 Compression Therapy Performed for Wound Wound #2 Left,Posterior Lower Leg Assessment: Performed By: Clinician Huel Coventry, RN Compression Type: Four Layer Post Procedure Diagnosis Same as Pre-procedure Electronic Signature(s) Signed: 11/28/2016 1:35:33 PM By: Elliot Gurney, BSN, RN, CWS, Kim RN, BSN Entered By: Elliot Gurney, BSN, RN, CWS, Kim on 11/28/2016 09:52:53 Hopwood, Sabian ([id number]) -------------------------------------------------------------------------------- Compression Therapy Details Patient Name: [name] Date of Service: 11/28/2016 9:15 AM Medical Record Number: [id number] Patient Account Number: [id number] Date of Birth/Sex: [birth year] (61 y.o. Male) Treating RN: Huel Coventry Primary Care Mikhael Hendriks: Rolin Barry Other Clinician: Referring Stefannie Defeo: Rolin Barry Treating Bruni Cohick/Extender: Maxwell Caul Weeks in Treatment: 3 Compression Therapy Performed for Wound Wound #1 Left,Anterior Lower Leg Assessment: Performed By: Clinician Huel Coventry, RN Compression Type: Four Layer Post Procedure Diagnosis Same as Pre-procedure Electronic Signature(s) Signed: 11/28/2016 1:35:33 PM By: Elliot Gurney, BSN, RN, CWS, Kim RN, BSN Entered By: Elliot Gurney, BSN, RN, CWS, Kim on 11/28/2016 09:52:54 Hernon, Jobin ([id number]) -------------------------------------------------------------------------------- Encounter Discharge Information Details Patient Name: [name] Date of Service: 11/28/2016 9:15 AM Medical Record Number: [id number] Patient Account Number: [id number] Date of Birth/Sex: May 22, 1955 (61 y.o. Male) Treating RN: Huel Coventry Primary Care Omarie Parcell: Rolin Barry Other Clinician: Referring Alvenia Treese: Rolin Barry Treating Kathlyn Leachman/Extender: Altamese [place] in Treatment: 3 Encounter Discharge Information Items Discharge Pain Level: 0 Discharge Condition: Stable Ambulatory Status: Ambulatory Discharge Destination: Home Transportation: Private Auto Accompanied By: wife Schedule Follow-up Appointment: Yes Medication Reconciliation completed Yes and provided to Patient/Care  Gerre Ranum: Patient Clinical Summary of Care: Declined Electronic Signature(s) Signed: 11/28/2016 1:35:33 PM By: Elliot Gurney, BSN, RN, CWS, Kim RN, BSN Entered By: Elliot Gurney, BSN, RN, CWS, Kim on 11/28/2016 09:54:43 Hartgrove, Neel ([id number]) -------------------------------------------------------------------------------- Lower Extremity Assessment Details Patient Name: [name] Serene, [name] Date of Service: 11/28/2016 9:15 AM Medical Record Number: [id number] Patient Account Number: [id number] Date of Birth/Sex: 1955-11-03 (61 y.o. Male) Treating RN: Huel Coventry Primary Care Alante Weimann: Rolin Barry Other Clinician: Referring Jakelyn Squyres: Rolin Barry Treating Kela Baccari/Extender: Maxwell Caul Weeks in Treatment: 3 Edema Assessment Assessed: [Left: No] [Right: No] E[Left: dema] [Right: :] Calf Left: Right: Point of  Measurement: 36 cm From Medial Instep 60 cm cm Ankle Left: Right: Point of Measurement: 13 cm From Medial Instep 42.5 cm cm Vascular Assessment Pulses: Dorsalis Pedis Palpable: [Left:Yes] Doppler Audible: [Left:Yes] Posterior Tibial Palpable: [Left:Yes] Doppler Audible: [Left:Yes] Extremity colors, hair growth, and conditions: Extremity Color: [Left:Normal] Hair Growth on Extremity: [Left:Yes] Temperature of Extremity: [Left:Warm] Capillary Refill: [Left:> 3 seconds] Dependent Rubor: [Left:No] Blanched when Elevated: [Left:No] Lipodermatosclerosis: [Left:No] Toe Nail Assessment Left: Right: Thick: Yes Discolored: Yes Deformed: Yes Improper Length and Hygiene: Alekxander Isola, Ashraf ([id number]) Electronic Signature(s) Signed: 11/28/2016 1:35:33 PM By: Elliot Gurney, BSN, RN, CWS, Kim RN, BSN Entered By: Elliot Gurney, BSN, RN, CWS, Kim on 11/28/2016 09:23:34 Lyerly, Jayleen ([id number]) -------------------------------------------------------------------------------- Multi Wound Chart Details Patient Name: [name] Serene Novak Date of Service: 11/28/2016 9:15 AM Medical Record Number: [id number] Patient  Account Number: [id number] Date of Birth/Sex: 1955/04/16 (61 y.o. 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 Comorbid History: Neuropathy Neuropathy Neuropathy Date Acquired: 10/23/2016 10/23/2016 11/25/2016 Weeks of Treatment: 3 3 0 Wound Status: Open Open Open Measurements L x W x D 8x6x0.1 12x15x0.1 2x3x0.1 (cm) Area (cm) : 37.699 141.372 4.712 Volume (cm) : 3.77 14.137 0.471 % Reduction in Area: 40.00% -15.40% 0.00% % Reduction in Volume: 40.00% -15.40% 0.00% Classification: Partial Thickness Partial Thickness Partial Thickness Exudate Amount: Large Large None Present Exudate Type: Serous Serous N/A Exudate Color: amber amber N/A Wound Margin: Indistinct, nonvisible Flat and Intact Flat and Intact Granulation Amount: Large (67-100%) None Present (0%) None Present (0%) Granulation Quality: Pink N/A N/A Necrotic Amount: Small (1-33%) Large (67-100%) None Present (0%) Exposed Structures: Fascia: No Fascia: No Fascia: No Fat Layer (Subcutaneous Fat Layer (Subcutaneous Fat Layer (Subcutaneous Tissue) Exposed: No Tissue) Exposed: No Tissue) Exposed: No Kutscher, Suhayb ([id number]) Tendon: No Tendon: No Tendon: No Muscle: No Muscle: No Muscle: No Joint: No Joint: No Joint: No Bone: No Bone: No Bone: No Limited to Skin Limited to Skin Breakdown Breakdown Epithelialization: None Large (67-100%) Medium (34-66%) Periwound Skin Texture:  Excoriation: No No Abnormalities Noted No Abnormalities Noted Induration: No Callus: No Crepitus: No Rash: No Scarring: No Periwound Skin Maceration: Yes No Abnormalities Noted No Abnormalities Noted Moisture: Dry/Scaly: No Periwound Skin Color: Atrophie Blanche: No No Abnormalities Noted No Abnormalities Noted Cyanosis: No Ecchymosis: No Erythema: No Hemosiderin Staining: No Mottled: No Pallor: No Rubor: No Temperature: No Abnormality N/A N/A Tenderness on Yes No No Palpation: Wound Preparation: Ulcer Cleansing: Wound Ulcer Cleansing: Wound Ulcer Cleansing: Not Cleanser Cleanser Cleansed Topical Anesthetic Topical Anesthetic Topical Anesthetic Applied: Other: lidocaine Applied: Other: lidocaine Applied: None 4% 4% Procedures Performed: Compression Therapy Compression Therapy N/A Treatment Notes Wound #1 (Left, Anterior Lower Leg) 1. Cleansed with: Clean wound with Normal Saline 2. Anesthetic Hurricaine Topical Anesthetic Spray 3. Peri-wound Care: Barrier cream 4. Dressing Applied: Aquacel Ag 5. Secondary Dressing Applied Kerlix/Conform Notes Xtra sorb Hires, Jash ([id number]) Wound #2 (Left, Posterior Lower Leg) 1. Cleansed with: Clean wound with Normal Saline 2. Anesthetic Hurricaine Topical Anesthetic Spray 3. Peri-wound Care: Barrier cream 4. Dressing Applied: Aquacel Ag 5. Secondary Dressing Applied Kerlix/Conform Notes Xtra sorb Wound #3 (Left  Forearm) 1. Cleansed with: Clean wound with Normal Saline 4. Dressing Applied: Silvadene Cream Notes telfa and conform Electronic Signature(s) Signed: 11/28/2016 4:28:59 PM By: Baltazar Najjar MD Entered By: Baltazar Najjar on 11/28/2016 09:55:19 Swigert, Garv ([id number]) -------------------------------------------------------------------------------- Multi-Disciplinary Care Plan Details Patient Name: [name] Serene Giomar Date of Service: 11/28/2016 9:15 AM Medical Record Number: [id number] Patient Account  Number: [id number] Date of Birth/Sex: [birth year] (61 y.o. Male) Treating RN: Huel Coventry Primary Care Diamone Whistler: Rolin Barry Other Clinician: Referring Alichia Alridge: Rolin Barry Treating Pradyun Ishman/Extender: Altamese [place] in Treatment: 3 Active Inactive ` Orientation to the Wound Care Program Nursing Diagnoses: Knowledge deficit related to the wound healing center program Goals: Patient/caregiver will verbalize understanding of the Wound Healing Center Program Date Initiated: 11/06/2016 Target Resolution Date: 12/08/2016 Goal Status: Active Interventions: Provide education on orientation to the wound center Notes: ` Pain, Acute or Chronic Nursing Diagnoses: Pain, acute or chronic: actual or potential Potential alteration in comfort, pain Goals: Patient/caregiver will verbalize adequate pain control between visits Date Initiated: 11/06/2016 Target Resolution Date: 03/09/2017 Goal Status: Active Interventions: Complete pain assessment as per visit requirements Encourage patient to take pain medications as prescribed Notes: ` Wound/Skin Impairment Nursing Diagnoses: Versie, Fleener Avraj ([id number]) Impaired tissue integrity Knowledge deficit related to ulceration/compromised skin integrity Goals: Ulcer/skin breakdown will have a volume reduction of 80% by week 12 Date Initiated: 11/06/2016 Target Resolution Date: 03/02/2017 Goal Status: Active Interventions: Assess patient/caregiver ability to perform ulcer/skin care regimen upon admission and as needed Assess ulceration(s) every visit Notes: Electronic Signature(s) Signed: 11/28/2016 1:35:33 PM By: Elliot Gurney, BSN, RN, CWS, Kim RN, BSN Entered By: Elliot Gurney, BSN, RN, CWS, Kim on 11/28/2016 09:30:20 Shippy, Alek ([id number]) -------------------------------------------------------------------------------- Pain Assessment Details Patient Name: [name] Serene Tyse Date of Service: 11/28/2016 9:15 AM Medical Record Number: [id number] Patient  Account Number: [id number] Date of Birth/Sex: [birth year] (61 y.o. Male) Treating RN: Huel Coventry Primary Care Doyce Saling: Rolin Barry Other Clinician: Referring Rudra Hobbins: Rolin Barry Treating Alannah Averhart/Extender: Maxwell Caul Weeks in Treatment: 3 Active Problems Location of Pain Severity and Description of Pain Patient Has Paino Yes Site Locations Pain Location: Pain in Ulcers With Dressing Change: Yes Pain Management and Medication Current Pain Management: Goals for Pain Management Topical or injectable lidocaine is offered to patient for acute pain when surgical debridement is performed. If needed, Patient is instructed to use over the counter pain medication for the following 24-48 hours after debridement. Wound care MDs do not prescribed pain medications. Patient has chronic pain or uncontrolled pain. Patient has been instructed to make an appointment with their Primary Care Physician for pain management. Electronic Signature(s) Signed: 11/28/2016 1:35:33 PM By: Elliot Gurney, BSN, RN, CWS, Kim RN, BSN Entered By: Elliot Gurney, BSN, RN, CWS, Kim on 11/28/2016 09:00:00 Rock, Mervin ([id number]) -------------------------------------------------------------------------------- Patient/Caregiver Education Details Patient Name: [name] Serene Keawe Date of Service: 11/28/2016 9:15 AM Medical Record Number: [id number] Patient Account Number: [id number] Date of Birth/Gender: 1955/11/01 (61 y.o. Male) Treating RN: Huel Coventry Primary Care Physician: Rolin Barry Other Clinician: Referring Physician: Rolin Barry Treating Physician/Extender: Altamese [place] in Treatment: 3 Education Assessment Education Provided To: Patient Education Topics Provided Wound/Skin Impairment: Handouts: Caring for Your Ulcer Methods: Demonstration, Explain/Verbal Responses: State content correctly Electronic Signature(s) Signed: 11/28/2016 1:35:33 PM By: Elliot Gurney, BSN, RN, CWS, Kim RN, BSN Entered By: Elliot Gurney, BSN,  RN, CWS, Kim on 11/28/2016 09:55:01 Sobolewski, Noriel ([id number]) -------------------------------------------------------------------------------- Wound Assessment Details Patient Name: [name] Serene Eriberto Date of Service: 11/28/2016 9:15 AM Medical Record Number: [id number] Patient Account Number: [id number] Date of Birth/Sex: 01/03/1956 (61  y.o. Male) Treating RN: Huel Coventry Primary Care Emali Heyward: Rolin Barry Other Clinician: Referring Meggen Spaziani: Rolin Barry Treating Brahm Barbeau/Extender: Altamese [place] in Treatment: 3 Wound Status Wound Number: 1 Primary Etiology: Lymphedema Wound Location: Left Lower Leg - Anterior Wound Status: Open Wounding Event: Gradually Appeared Comorbid History: Neuropathy Date Acquired: 10/23/2016 Weeks Of Treatment: 3 Clustered Wound: No Photos Wound Measurements Length: (cm) 8 Width: (cm) 6 Depth: (cm) 0.1 Area: (cm) 37.699 Volume: (cm) 3.77 % Reduction in Area: 40% % Reduction in Volume: 40% Epithelialization: None Tunneling: No Undermining: No Wound Description Classification: Partial Thickness Wound Margin: Indistinct, nonvisible Exudate Amount: Large Exudate Type: Serous Exudate Color: amber Foul Odor After Cleansing: No Slough/Fibrino Yes Wound Bed Granulation Amount: Large (67-100%) Exposed Structure Granulation Quality: Pink Fascia Exposed: No Necrotic Amount: Small (1-33%) Fat Layer (Subcutaneous Tissue) Exposed: No Necrotic Quality: Adherent Slough Tendon Exposed: No Muscle Exposed: No Joint Exposed: No Bone Exposed: No Shuping, Jeremia ([id number]) Limited to Skin Breakdown Periwound Skin Texture Texture Color No Abnormalities Noted: No No Abnormalities Noted: No Callus: No Atrophie Blanche: No Crepitus: No Cyanosis: No Excoriation: No Ecchymosis: No Induration: No Erythema: No Rash: No Hemosiderin Staining: No Scarring: No Mottled: No Pallor: No Moisture Rubor: No No Abnormalities Noted: No Dry / Scaly:  No Temperature / Pain Maceration: Yes Temperature: No Abnormality Tenderness on Palpation: Yes Wound Preparation Ulcer Cleansing: Wound Cleanser Topical Anesthetic Applied: Other: lidocaine 4%, Treatment Notes Wound #1 (Left, Anterior Lower Leg) 1. Cleansed with: Clean wound with Normal Saline 2. Anesthetic Hurricaine Topical Anesthetic Spray 3. Peri-wound Care: Barrier cream 4. Dressing Applied: Aquacel Ag 5. Secondary Dressing Applied Kerlix/Conform Notes Xtra sorb Electronic Signature(s) Signed: 11/28/2016 1:35:33 PM By: Elliot Gurney, BSN, RN, CWS, Kim RN, BSN Entered By: Elliot Gurney, BSN, RN, CWS, Kim on 11/28/2016 09:15:17 Berni, Terrell ([id number]) -------------------------------------------------------------------------------- Wound Assessment Details Patient Name: AMBROSINI, Ruhaan Date of Service: 11/28/2016 9:15 AM Medical Record Number: [id number] Patient Account Number: [id number] Date of Birth/Sex: 05-Feb-1956 (61 y.o. Male) Treating RN: Huel Coventry Primary Care Sharonlee Nine: Rolin Barry Other Clinician: Referring Christoph Copelan: Rolin Barry Treating Apollos Tenbrink/Extender: Altamese [place] in Treatment: 3 Wound Status Wound Number: 2 Primary Etiology: Lymphedema Wound Location: Left Lower Leg - Posterior Wound Status: Open Wounding Event: Gradually Appeared Comorbid History: Neuropathy Date Acquired: 10/23/2016 Weeks Of Treatment: 3 Clustered Wound: No Photos Wound Measurements Length: (cm) 12 Width: (cm) 15 Depth: (cm) 0.1 Area: (cm) 141.372 Volume: (cm) 14.137 % Reduction in Area: -15.4% % Reduction in Volume: -15.4% Epithelialization: Large (67-100%) Tunneling: No Undermining: No Wound Description Classification: Partial Thickness Wound Margin: Flat and Intact Exudate Amount: Large Exudate Type: Serous Exudate Color: amber Foul Odor After Cleansing: No Slough/Fibrino Yes Wound Bed Granulation Amount: None Present (0%) Exposed Structure Necrotic Amount:  Large (67-100%) Fascia Exposed: No Necrotic Quality: Adherent Slough Fat Layer (Subcutaneous Tissue) Exposed: No Tendon Exposed: No Muscle Exposed: No Joint Exposed: No Bone Exposed: No Braxton, Xion ([id number]) Limited to Skin Breakdown Periwound Skin Texture Texture Color No Abnormalities Noted: No No Abnormalities Noted: No Moisture No Abnormalities Noted: No Wound Preparation Ulcer Cleansing: Wound Cleanser Topical Anesthetic Applied: Other: lidocaine 4%, Treatment Notes Wound #2 (Left, Posterior Lower Leg) 1. Cleansed with: Clean wound with Normal Saline 2. Anesthetic Hurricaine Topical Anesthetic Spray 3. Peri-wound Care: Barrier cream 4. Dressing Applied: Aquacel Ag 5. Secondary Dressing Applied Kerlix/Conform Notes Xtra sorb Electronic Signature(s) Signed: 11/28/2016 1:35:33 PM By: Elliot Gurney, BSN, RN, CWS, Kim RN, BSN Entered By: Elliot Gurney, BSN, RN, CWS, Kim on 11/28/2016 09:16:31  Breylon, Sherrow Amaziah ([id number]) -------------------------------------------------------------------------------- Wound Assessment Details Patient Name: Qaadir, Kent Rocklin Date of Service: 11/28/2016 9:15 AM Medical Record Number: [id number] Patient Account Number: [id number] Date of Birth/Sex: 02-26-1956 (61 y.o. Male) Treating RN: Huel Coventry Primary Care Cintya Daughety: Rolin Barry Other Clinician: Referring Derra Shartzer: Rolin Barry Treating Umar Patmon/Extender: Maxwell Caul Weeks in Treatment: 3 Wound Status Wound Number: 3 Primary Etiology: 2nd degree Burn Wound Location: Left Forearm Wound Status: Open Wounding Event: Thermal Burn Comorbid History: Neuropathy Date Acquired: 11/25/2016 Weeks Of Treatment: 0 Clustered Wound: No Photos Wound Measurements Length: (cm) 2 Width: (cm) 3 Depth: (cm) 0.1 Area: (cm) 4.712 Volume: (cm) 0.471 % Reduction in Area: 0% % Reduction in Volume: 0% Epithelialization: Medium (34-66%) Tunneling: No Undermining: No Wound Description Classification:  Partial Thickness Foul Odor Afte Wound Margin: Flat and Intact Slough/Fibrino Exudate Amount: None Present r Cleansing: No No Wound Bed Granulation Amount: None Present (0%) Exposed Structure Necrotic Amount: None Present (0%) Fascia Exposed: No Fat Layer (Subcutaneous Tissue) Exposed: No Tendon Exposed: No Muscle Exposed: No Joint Exposed: No Bone Exposed: No Periwound Skin Texture Texture Color Virgil, Ocie ([id number]) No Abnormalities Noted: No No Abnormalities Noted: No Moisture No Abnormalities Noted: No Wound Preparation Ulcer Cleansing: Not Cleansed Topical Anesthetic Applied: None Treatment Notes Wound #3 (Left Forearm) 1. Cleansed with: Clean wound with Normal Saline 4. Dressing Applied: Silvadene Cream Notes telfa and conform Electronic Signature(s) Signed: 11/28/2016 1:35:33 PM By: Elliot Gurney, BSN, RN, CWS, Kim RN, BSN Entered By: Elliot Gurney, BSN, RN, CWS, Kim on 11/28/2016 09:18:05 Capes, Ermin ([id number]) -------------------------------------------------------------------------------- Vitals Details Patient Name: [name] Serene Timoth Date of Service: 11/28/2016 9:15 AM Medical Record Number: [id number] Patient Account Number: [id number] Date of Birth/Sex: 1955-03-31 (61 y.o. Male) Treating RN: Huel Coventry Primary Care Kashonda Sarkisyan: Rolin Barry Other Clinician: Referring Twala Collings: Rolin Barry Treating Carrol Bondar/Extender: Altamese [place] in Treatment: 3 Vital Signs Time Taken: 09:00 Temperature (F): 98.2 Height (in): 77 Pulse (bpm): 75 Weight (lbs): 340 Respiratory Rate (breaths/min): 18 Body Mass Index (BMI): 40.3 Blood Pressure (mmHg): 130/71 Reference Range: 80 - 120 mg / dl Electronic Signature(s) Signed: 11/28/2016 1:35:33 PM By: Elliot Gurney, BSN, RN, CWS, Kim RN, BSN Entered By: Elliot Gurney, BSN, RN, CWS, Kim on 11/28/2016 09:03:11
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
## 2016-12-05 ENCOUNTER — Ambulatory Visit: Payer: Medicare Other | Admitting: Internal Medicine

## 2016-12-12 ENCOUNTER — Encounter: Payer: Medicare Other | Attending: Internal Medicine | Admitting: Internal Medicine

## 2016-12-12 DIAGNOSIS — L03116 Cellulitis of left lower limb: Secondary | ICD-10-CM | POA: Diagnosis not present

## 2016-12-12 DIAGNOSIS — G4733 Obstructive sleep apnea (adult) (pediatric): Secondary | ICD-10-CM | POA: Insufficient documentation

## 2016-12-12 DIAGNOSIS — Z96653 Presence of artificial knee joint, bilateral: Secondary | ICD-10-CM | POA: Diagnosis not present

## 2016-12-12 DIAGNOSIS — T22012D Burn of unspecified degree of left forearm, subsequent encounter: Secondary | ICD-10-CM | POA: Insufficient documentation

## 2016-12-12 DIAGNOSIS — Z7901 Long term (current) use of anticoagulants: Secondary | ICD-10-CM | POA: Insufficient documentation

## 2016-12-12 DIAGNOSIS — Z881 Allergy status to other antibiotic agents status: Secondary | ICD-10-CM | POA: Diagnosis not present

## 2016-12-12 DIAGNOSIS — I87332 Chronic venous hypertension (idiopathic) with ulcer and inflammation of left lower extremity: Secondary | ICD-10-CM | POA: Insufficient documentation

## 2016-12-12 DIAGNOSIS — L97221 Non-pressure chronic ulcer of left calf limited to breakdown of skin: Secondary | ICD-10-CM | POA: Diagnosis not present

## 2016-12-12 DIAGNOSIS — Z86718 Personal history of other venous thrombosis and embolism: Secondary | ICD-10-CM | POA: Insufficient documentation

## 2016-12-12 DIAGNOSIS — X58XXXD Exposure to other specified factors, subsequent encounter: Secondary | ICD-10-CM | POA: Diagnosis not present

## 2016-12-12 DIAGNOSIS — G629 Polyneuropathy, unspecified: Secondary | ICD-10-CM | POA: Insufficient documentation
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
## 2016-12-19 ENCOUNTER — Ambulatory Visit: Payer: Medicare Other | Admitting: Internal Medicine

## 2016-12-26 ENCOUNTER — Encounter: Payer: Medicare Other | Admitting: Internal Medicine

## 2016-12-26 DIAGNOSIS — I87332 Chronic venous hypertension (idiopathic) with ulcer and inflammation of left lower extremity: Secondary | ICD-10-CM | POA: Diagnosis not present
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
## 2016-12-28 NOTE — Progress Notes (Addendum)
[name], Jarrette ([id number]) Visit Report for 12/26/2016 Arrival Information Details Patient Name: [name], [name] Date of Service: 12/26/2016 11:00 AM Medical Record Number: [id number] Patient Account Number: [id number] Date of Birth/Sex: Aug 26, 1955 [age] y.o. Male) Treating RN: Huel CoventryWoody, Kim Primary Care Jaedin Trumbo: Rolin Barrylmedo, Mario Other Clinician: Referring Melvenia Favela: Rolin Barrylmedo, Mario Treating Kathlen Sakurai/Extender: Altamese CarolinaOBSON, MICHAEL G Weeks in Treatment: 7 Visit Information History Since Last Visit Added or deleted any medications: No Patient Arrived: Crutches Any new allergies or adverse reactions: No Arrival Time: 11:17 Had a fall or experienced change in No Accompanied By: Enos FlingNeighbor activities of daily living that may affect Transfer Assistance: None risk of falls: Patient Requires Transmission-Based No Pain Present Now: No Precautions: Patient Has Alerts: Yes Patient Alerts: Patient on Blood Thinner warfarin NO ABI d/t PAIN Electronic Signature(s) Signed: 12/26/2016 5:14:29 PM By: Elliot GurneyWoody, BSN, RN, CWS, Kim RN, BSN Entered By: Elliot GurneyWoody, BSN, RN, CWS, Kim on 12/26/2016 11:21:06 Morua, Eragon ([id number]) -------------------------------------------------------------------------------- Encounter Discharge Information Details Patient Name: [name], [name] Date of Service: 12/26/2016 11:00 AM Medical Record Number: [id number] Patient Account Number: [id number] Date of Birth/Sex: Aug 26, 1955 [age] y.o. Male) Treating RN: Huel CoventryWoody, Kim Primary Care Alianys Chacko: Rolin Barrylmedo, Mario Other Clinician: Referring Joahan Swatzell: Rolin Barrylmedo, Mario Treating Debbora Ang/Extender: Altamese CarolinaOBSON, MICHAEL G Weeks in Treatment: 7 Encounter Discharge Information Items Discharge Pain Level: 0 Discharge Condition: Stable Ambulatory Status: Crutches Discharge Destination: Home Private Transportation: Auto friend in Accompanied By: lobby Schedule Follow-up Appointment: Yes Medication Reconciliation completed and provided Yes to Patient/Care  Bracha Frankowski: Clinical Summary of Care: Electronic Signature(s) Signed: 12/26/2016 5:13:48 PM By: Elliot GurneyWoody, BSN, RN, CWS, Kim RN, BSN Entered By: Elliot GurneyWoody, BSN, RN, CWS, Kim on 12/26/2016 17:13:47 Canner, Jung ([id number]) -------------------------------------------------------------------------------- Lower Extremity Assessment Details Patient Name: [name], Gautham Date of Service: 12/26/2016 11:00 AM Medical Record Number: [id number] Patient Account Number: [id number] Date of Birth/Sex: Aug 26, 1955 [age] y.o. Male) Treating RN: Huel CoventryWoody, Kim Primary Care Shahil Speegle: Rolin Barrylmedo, Mario Other Clinician: Referring Jonah Nestle: Rolin Barrylmedo, Mario Treating Kirkland Figg/Extender: Maxwell CaulOBSON, MICHAEL G Weeks in Treatment: 7 Edema Assessment Assessed: [Left: No] [Right: No] [Left: Edema] [Right: :] Calf Left: Right: Point of Measurement: 36 cm From Medial Instep 61.5 cm cm Ankle Left: Right: Point of Measurement: 13 cm From Medial Instep 43.6 cm cm Vascular Assessment Claudication: Claudication Assessment [Left:None] Pulses: Dorsalis Pedis Palpable: [Left:Yes] Posterior Tibial Extremity colors, hair growth, and conditions: Extremity Color: [Left:Red] Hair Growth on Extremity: [Left:Yes] Temperature of Extremity: [Left:Cold] Capillary Refill: [Left:> 3 seconds] Toe Nail Assessment Left: Right: Thick: Yes Discolored: Yes Deformed: Yes Improper Length and Hygiene: Yes Electronic Signature(s) Signed: 12/26/2016 5:14:29 PM By: Elliot GurneyWoody, BSN, RN, CWS, Kim RN, BSN Entered By: Elliot GurneyWoody, BSN, RN, CWS, Kim on 12/26/2016 11:37:29 Hawker, Ausencio ([id number]) -------------------------------------------------------------------------------- Multi Wound Chart Details Patient Name: [name], [name] Date of Service: 12/26/2016 11:00 AM Medical Record Number: [id number] Patient Account Number: [id number] Date of Birth/Sex: Aug 26, 1955 [age] y.o. Male) Treating RN: Huel CoventryWoody, Kim Primary Care Rayonna Heldman: Rolin Barrylmedo, Mario Other Clinician: Referring  Andy Allende: Rolin Barrylmedo, Mario Treating Tin Engram/Extender: Maxwell CaulOBSON, MICHAEL G Weeks in Treatment: 7 Vital Signs Height(in): 77 Pulse(bpm): 65 Weight(lbs): 340 Blood Pressure(mmHg): 135/78 Body Mass Index(BMI): 40 Temperature(F): 98.0 Respiratory Rate 16 (breaths/min): Photos: [N/A:N/A] Wound Location: Left Lower Leg - Anterior Left Lower Leg - Posterior N/A Wounding Event: Gradually Appeared Gradually Appeared N/A Primary Etiology: Lymphedema Lymphedema N/A Comorbid History: Neuropathy Neuropathy N/A Date Acquired: 10/23/2016 10/23/2016 N/A Weeks of Treatment: 7 7 N/A Wound Status: Open Open N/A Measurements L x W x D 5x5x0.1 11x14x0.1 N/A (cm) Area (cm) : 19.635 120.951 N/A Volume (cm) : 1.963 12.095  N/A % Reduction in Area: 68.80% 1.30% N/A % Reduction in Volume: 68.80% 1.30% N/A Classification: Partial Thickness Partial Thickness N/A Exudate Amount: Small Large N/A Exudate Type: Serous Serous N/A Exudate Color: amber amber N/A Wound Margin: Indistinct, nonvisible Indistinct, nonvisible N/A Granulation Amount: Large (67-100%) Medium (34-66%) N/A Granulation Quality: Red Pink, Hyper-granulation N/A Necrotic Amount: None Present (0%) Small (1-33%) N/A Exposed Structures: Fascia: No Fascia: No N/A Fat Layer (Subcutaneous Fat Layer (Subcutaneous Tissue) Exposed: No Tissue) Exposed: No Tendon: No Tendon: No Muscle: No Muscle: No Joint: No Joint: No Bone: No Bone: No Limited to Skin Breakdown Periwound Skin Texture: Excoriation: No Excoriation: No N/A Induration: No Induration: No Lebon, Teran ([id number]) Callus: No Callus: No Crepitus: No Crepitus: No Rash: No Rash: No Scarring: No Scarring: No Periwound Skin Moisture: Maceration: No Maceration: Yes N/A Dry/Scaly: No Dry/Scaly: No Periwound Skin Color: Erythema: Yes Erythema: Yes N/A Atrophie Blanche: No Atrophie Blanche: No Cyanosis: No Cyanosis: No Ecchymosis: No Ecchymosis: No Hemosiderin  Staining: No Hemosiderin Staining: No Mottled: No Mottled: No Pallor: No Pallor: No Rubor: No Rubor: No Tenderness on Palpation: No No N/A Wound Preparation: Ulcer Cleansing: Wound Ulcer Cleansing: Wound N/A Cleanser Cleanser Topical Anesthetic Applied: Topical Anesthetic Applied: None None Treatment Notes Electronic Signature(s) Signed: 12/26/2016 5:28:55 PM By: Baltazar Najjar MD Entered By: Baltazar Najjar on 12/26/2016 17:04:16 Swalley, Larnce ([id number]) -------------------------------------------------------------------------------- Multi-Disciplinary Care Plan Details Patient Name: [name] Serene, [name] Date of Service: 12/26/2016 11:00 AM Medical Record Number: [id number] Patient Account Number: [id number] Date of Birth/Sex: 24-Jul-1955 (61 y.o. Male) Treating RN: Huel Coventry Primary Care Vertie Dibbern: Rolin Barry Other Clinician: Referring Geneviene Tesch: Rolin Barry Treating Rosalena Mccorry/Extender: Altamese [place] in Treatment: 7 Active Inactive Electronic Signature(s) Signed: 01/21/2017 1:42:50 PM By: Elliot Gurney, BSN, RN, CWS, Kim RN, BSN Previous Signature: 12/26/2016 5:14:29 PM Version By: Elliot Gurney, BSN, RN, CWS, Kim RN, BSN Entered By: Elliot Gurney, BSN, RN, CWS, Kim on 01/16/2017 12:58:24 Chrostowski, Willmer ([id number]) -------------------------------------------------------------------------------- Patient/Caregiver Education Details Patient Name: [name] Serene Jeanpierre Date of Service: 12/26/2016 11:00 AM Medical Record Number: [id number] Patient Account Number: [id number] Date of Birth/Gender: 1955-07-11 (61 y.o. Male) Treating RN: Huel Coventry Primary Care Physician: Rolin Barry Other Clinician: Referring Physician: Rolin Barry Treating Physician/Extender: Altamese [place] in Treatment: 7 Education Assessment Education Provided To: Patient Education Topics Provided Wound/Skin Impairment: Handouts: Caring for Your Ulcer Methods: Demonstration Responses: State content  correctly Electronic Signature(s) Signed: 12/26/2016 5:14:29 PM By: Elliot Gurney, BSN, RN, CWS, Kim RN, BSN Entered By: Elliot Gurney, BSN, RN, CWS, Kim on 12/26/2016 17:14:01 Felter, Tashan ([id number]) -------------------------------------------------------------------------------- Wound Assessment Details Patient Name: [name] Serene, [name] Date of Service: 12/26/2016 11:00 AM Medical Record Number: [id number] Patient Account Number: [id number] Date of Birth/Sex: 24-Jan-1956 (61 y.o. Male) Treating RN: Huel Coventry Primary Care Jakalyn Kratky: Rolin Barry Other Clinician: Referring Nymir Ringler: Rolin Barry Treating Kemoni Ortega/Extender: Maxwell Caul Weeks in Treatment: 7 Wound Status Wound Number: 1 Primary Etiology: Lymphedema Wound Location: Left Lower Leg - Anterior Wound Status: Open Wounding Event: Gradually Appeared Comorbid History: Neuropathy Date Acquired: 10/23/2016 Weeks Of Treatment: 7 Clustered Wound: No Photos Photo Uploaded By: Elliot Gurney, BSN, RN, CWS, Kim on 12/26/2016 11:47:20 Wound Measurements Length: (cm) 5 Width: (cm) 5 Depth: (cm) 0.1 Area: (cm) 19.635 Volume: (cm) 1.963 % Reduction in Area: 68.8% % Reduction in Volume: 68.8% Tunneling: No Undermining: No Wound Description Classification: Partial Thickness Wound Margin: Indistinct, nonvisible Exudate Amount: Small Exudate Type: Serous Exudate Color: amber Foul Odor After Cleansing: No Slough/Fibrino No Wound Bed Granulation Amount: Large (67-100%) Exposed Structure Granulation Quality:  Red Fascia Exposed: No Necrotic Amount: None Present (0%) Fat Layer (Subcutaneous Tissue) Exposed: No Tendon Exposed: No Muscle Exposed: No Joint Exposed: No Bone Exposed: No Limited to Skin Breakdown Periwound Skin Texture Texture Color No Abnormalities Noted: No No Abnormalities Noted: No Collyer, Leiland ([id number]) Callus: No Atrophie Blanche: No Crepitus: No Cyanosis: No Excoriation: No Ecchymosis: No Induration:  No Erythema: Yes Rash: No Hemosiderin Staining: No Scarring: No Mottled: No Pallor: No Moisture Rubor: No No Abnormalities Noted: No Dry / Scaly: No Maceration: No Wound Preparation Ulcer Cleansing: Wound Cleanser Topical Anesthetic Applied: None Electronic Signature(s) Signed: 12/26/2016 5:14:29 PM By: Elliot Gurney, BSN, RN, CWS, Kim RN, BSN Entered By: Elliot Gurney, BSN, RN, CWS, Kim on 12/26/2016 11:32:18 Pellot, Laiken ([id number]) -------------------------------------------------------------------------------- Wound Assessment Details Patient Name: [name] Serene, [name] Date of Service: 12/26/2016 11:00 AM Medical Record Number: [id number] Patient Account Number: [id number] Date of Birth/Sex: January 14, 1956 (61 y.o. Male) Treating RN: Huel Coventry Primary Care Natia Fahmy: Rolin Barry Other Clinician: Referring Nameer Summer: Rolin Barry Treating Saburo Luger/Extender: Maxwell Caul Weeks in Treatment: 7 Wound Status Wound Number: 2 Primary Etiology: Lymphedema Wound Location: Left Lower Leg - Posterior Wound Status: Open Wounding Event: Gradually Appeared Comorbid History: Neuropathy Date Acquired: 10/23/2016 Weeks Of Treatment: 7 Clustered Wound: No Photos Photo Uploaded By: Elliot Gurney, BSN, RN, CWS, Kim on 12/26/2016 11:47:20 Wound Measurements Length: (cm) 11 Width: (cm) 14 Depth: (cm) 0.1 Area: (cm) 120.951 Volume: (cm) 12.095 % Reduction in Area: 1.3% % Reduction in Volume: 1.3% Tunneling: No Undermining: No Wound Description Classification: Partial Thickness Wound Margin: Indistinct, nonvisible Exudate Amount: Large Exudate Type: Serous Exudate Color: amber Foul Odor After Cleansing: No Slough/Fibrino Yes Wound Bed Granulation Amount: Medium (34-66%) Exposed Structure Granulation Quality: Pink, Hyper-granulation Fascia Exposed: No Necrotic Amount: Small (1-33%) Fat Layer (Subcutaneous Tissue) Exposed: No Necrotic Quality: Adherent Slough Tendon Exposed: No Muscle Exposed:  No Joint Exposed: No Bone Exposed: No Periwound Skin Texture Texture Color No Abnormalities Noted: No No Abnormalities Noted: No Callus: No Atrophie Blanche: No Prine, Ilyaas ([id number]) Crepitus: No Cyanosis: No Excoriation: No Ecchymosis: No Induration: No Erythema: Yes Rash: No Hemosiderin Staining: No Scarring: No Mottled: No Pallor: No Moisture Rubor: No No Abnormalities Noted: No Dry / Scaly: No Maceration: Yes Wound Preparation Ulcer Cleansing: Wound Cleanser Topical Anesthetic Applied: None Electronic Signature(s) Signed: 12/26/2016 5:14:29 PM By: Elliot Gurney, BSN, RN, CWS, Kim RN, BSN Entered By: Elliot Gurney, BSN, RN, CWS, Kim on 12/26/2016 11:34:21 Kobs, Cristin ([id number]) -------------------------------------------------------------------------------- Vitals Details Patient Name: [name] Serene Patryk Date of Service: 12/26/2016 11:00 AM Medical Record Number: [id number] Patient Account Number: [id number] Date of Birth/Sex: [birth year] (61 y.o. Male) Treating RN: Huel Coventry Primary Care Dulcy Sida: Rolin Barry Other Clinician: Referring Donyale Falcon: Rolin Barry Treating Kethan Papadopoulos/Extender: Maxwell Caul Weeks in Treatment: 7 Vital Signs Time Taken: 11:20 Temperature (F): 98.0 Height (in): 77 Pulse (bpm): 65 Weight (lbs): 340 Respiratory Rate (breaths/min): 16 Body Mass Index (BMI): 40.3 Blood Pressure (mmHg): 135/78 Reference Range: 80 - 120 mg / dl Electronic Signature(s) Signed: 12/26/2016 5:14:29 PM By: Elliot Gurney, BSN, RN, CWS, Kim RN, BSN Entered By: Elliot Gurney, BSN, RN, CWS, Kim on 12/26/2016 11:21:57

## 2017-01-02 ENCOUNTER — Ambulatory Visit: Payer: Medicare Other | Admitting: Physician Assistant

## 2017-01-09 ENCOUNTER — Ambulatory Visit: Payer: Medicare Other | Admitting: Internal Medicine

## 2017-03-05 HISTORY — PX: OTHER SURGICAL HISTORY: SHX169

## 2018-09-18 IMAGING — DX DG ANKLE COMPLETE 3+V*L*
3 series · 3 of 3 positions shown · non-contrast
Comparison: None.

CLINICAL DATA: Lower extremity lymph edema.

EXAM:
LEFT ANKLE COMPLETE - 3+ VIEW

[ankle ap]
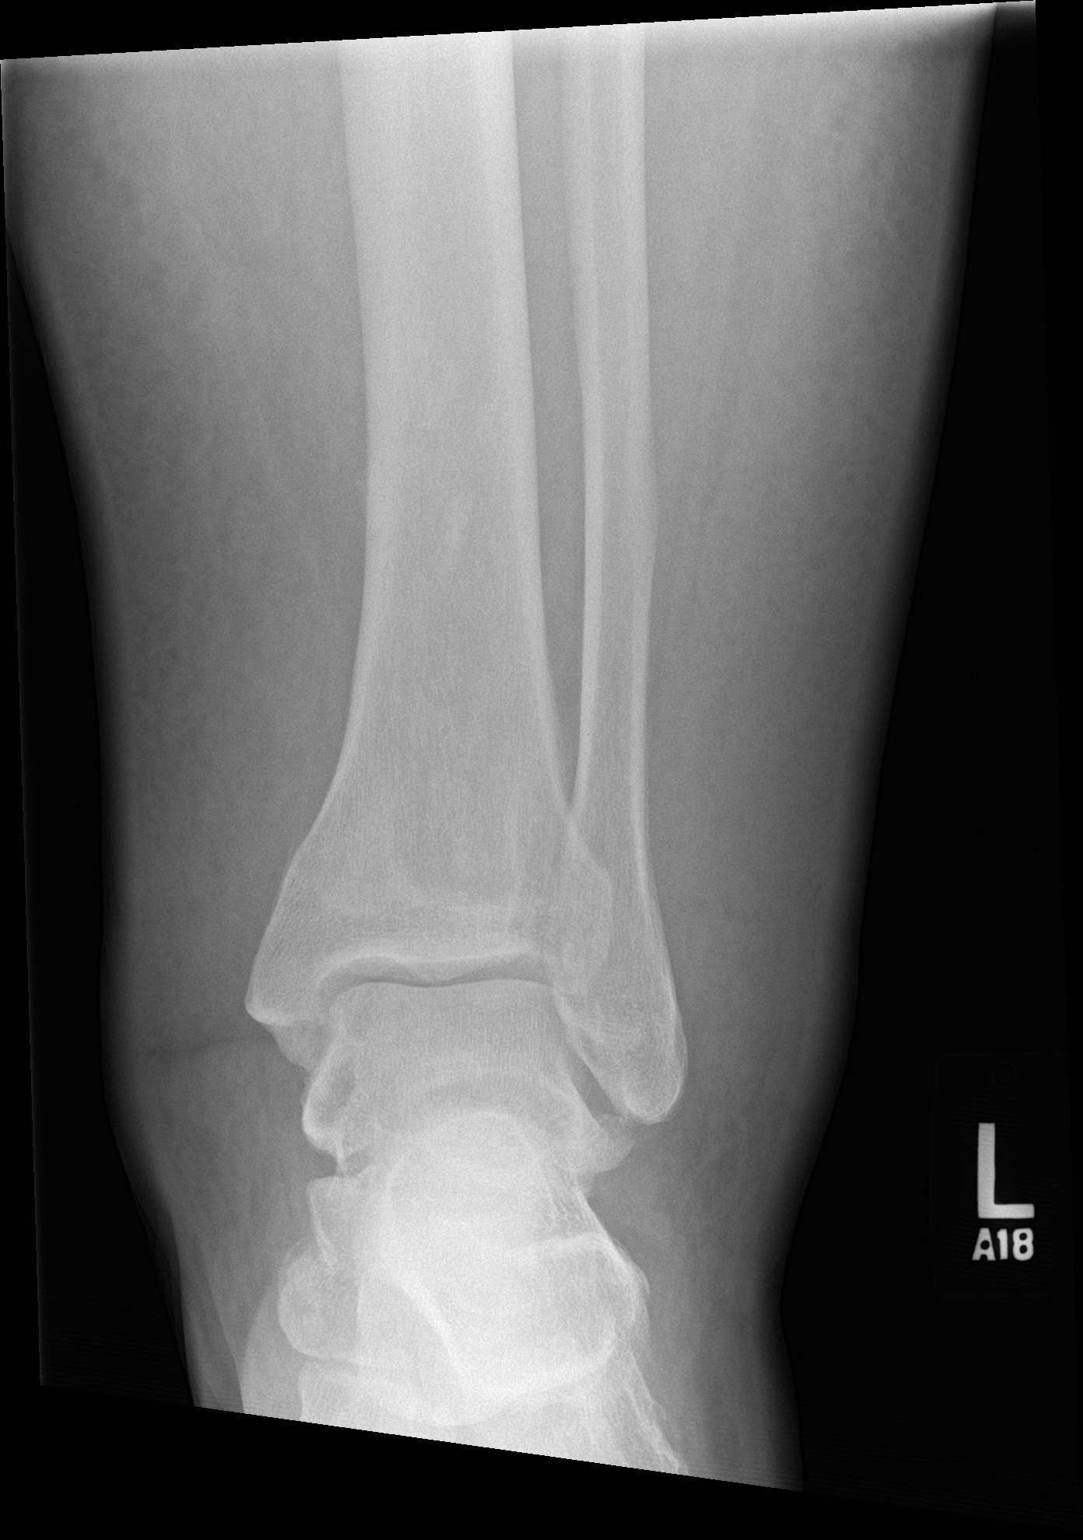

[ankle obl]
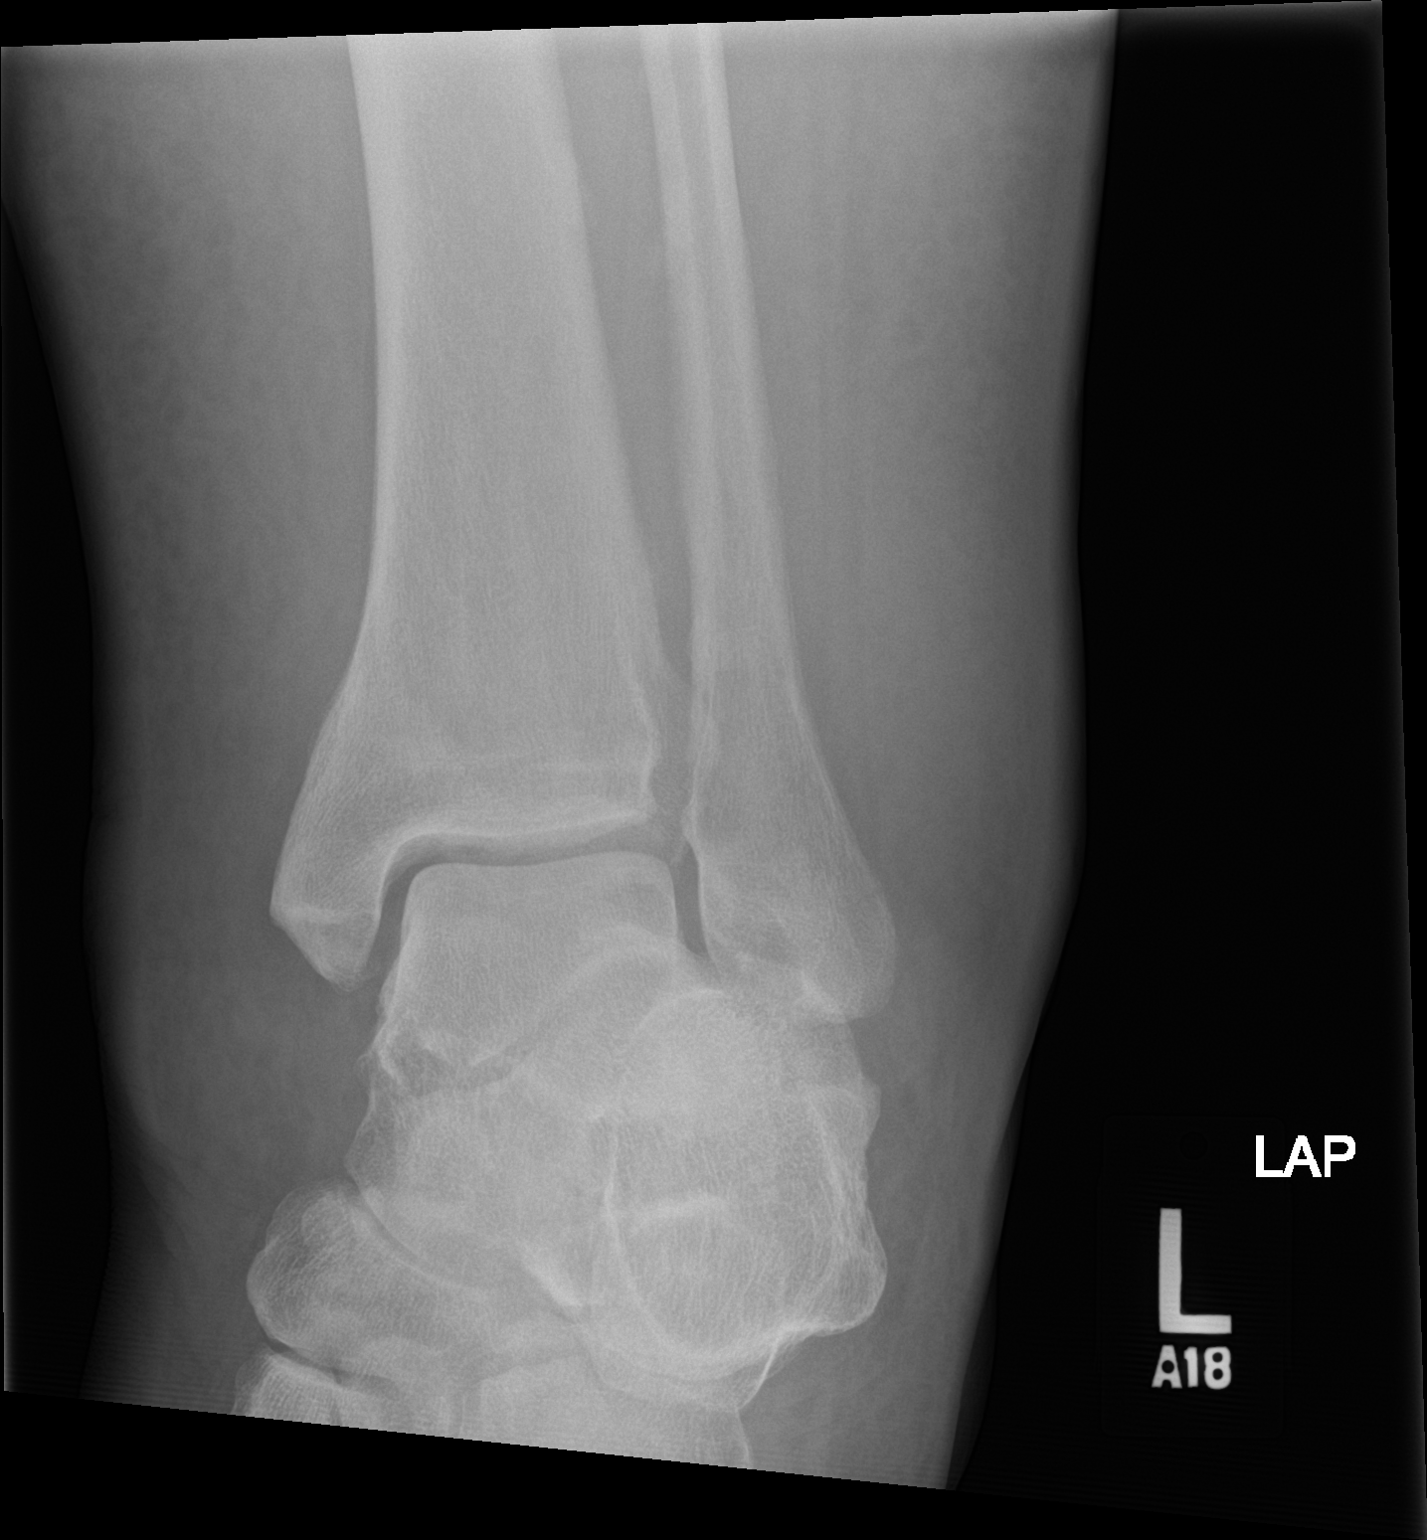

[ankle lat]
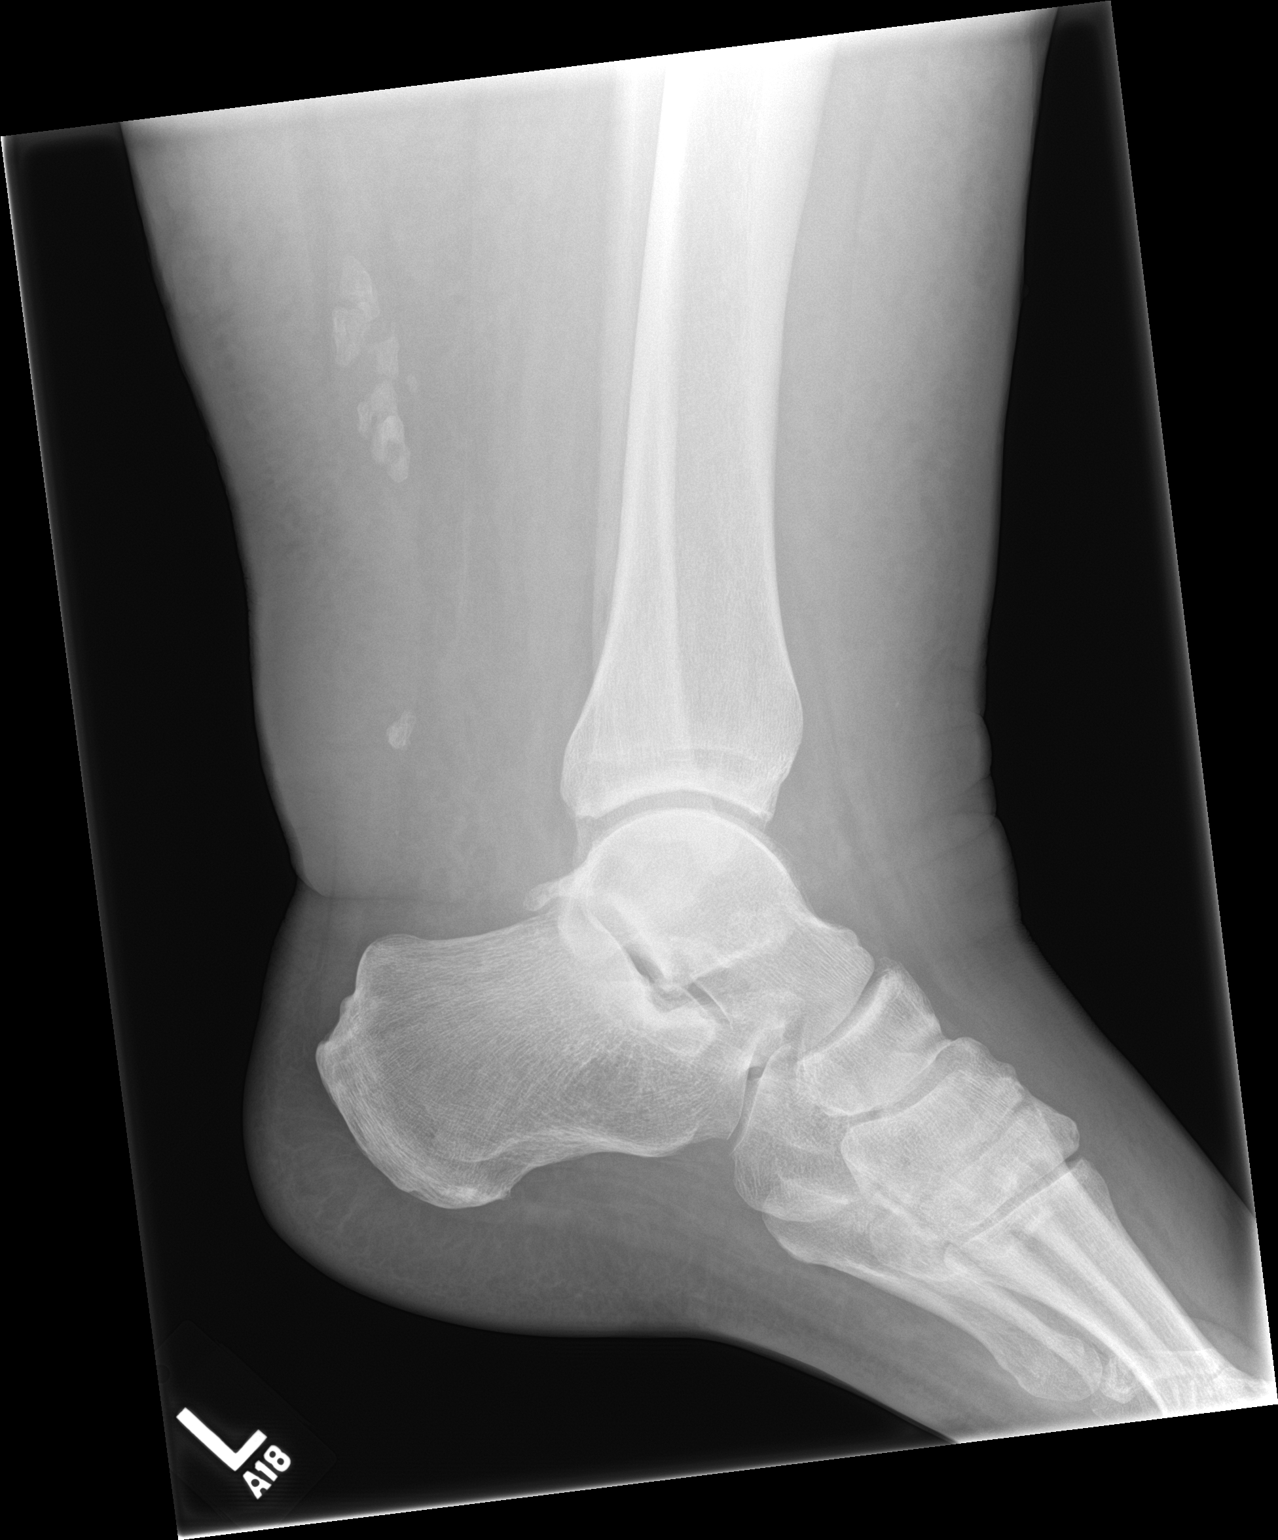

[3 of 3 positions shown; findings below may reference images not displayed]

FINDINGS: Large amount of soft tissue which could be a combination of obesity
an edematous tissue. Bones in joints appear unremarkable. No
traumatic or significant degenerative findings. There is
calcification within the Achilles tendon complex.
IMPRESSION: No significant bone or joint finding. Prominent soft tissue that
could be a combination of obesity and edematous tissue.

## 2019-06-01 ENCOUNTER — Ambulatory Visit: Payer: Medicare Other | Attending: Internal Medicine

## 2019-06-01 DIAGNOSIS — Z23 Encounter for immunization: Secondary | ICD-10-CM

## 2019-06-01 NOTE — Progress Notes (Signed)
   Covid-19 Vaccination Clinic  Name:  [name]    MRN: [id number] DOB: 04-09-1955  06/01/2019  [name] was observed post Covid-19 immunization for 15 minutes without incident. He was provided with Vaccine Information Sheet and instruction to access the V-Safe system.   [name] was instructed to call 911 with any severe reactions post vaccine: Marland Kitchen Difficulty breathing  . Swelling of face and throat  . A fast heartbeat  . A bad rash all over body  . Dizziness and weakness   Immunizations Administered    Name Date Dose VIS Date Route   Pfizer COVID-19 Vaccine 06/01/2019  9:58 AM 0.3 mL 02/13/2019 Intramuscular   Manufacturer: [employer], [employer]   Lot: [id number]   NDC: [address]-0

## 2019-06-24 ENCOUNTER — Ambulatory Visit: Payer: Medicare Other | Attending: Internal Medicine

## 2019-06-24 DIAGNOSIS — Z23 Encounter for immunization: Secondary | ICD-10-CM

## 2019-06-24 NOTE — Progress Notes (Signed)
   Covid-19 Vaccination Clinic  Name:  [name]    MRN: [id number] DOB: Apr 06, 1955  06/24/2019  [name] was observed post Covid-19 immunization for 15 minutes without incident. He was provided with Vaccine Information Sheet and instruction to access the V-Safe system.   [name] was instructed to call 911 with any severe reactions post vaccine: Marland Kitchen Difficulty breathing  . Swelling of face and throat  . A fast heartbeat  . A bad rash all over body  . Dizziness and weakness   Immunizations Administered    Name Date Dose VIS Date Route   Pfizer COVID-19 Vaccine 06/24/2019 11:14 AM 0.3 mL 04/29/2018 Intramuscular   Manufacturer: [employer], [employer]   Lot: [id number]   NDC: [address]-9

## 2020-08-10 ENCOUNTER — Telehealth: Payer: Self-pay

## 2020-08-10 NOTE — Telephone Encounter (Signed)
I connected by phone with [name] and/or patient's caregiver on 08/10/2020 at 8:58 AM to discuss the potential vaccination through our Homebound vaccination initiative.   Prevaccination Checklist for COVID-19 Vaccines  1.  Are you feeling sick today? no  2.  Have you ever received a dose of a COVID-19 vaccine?  yes      If yes, which one? Pfizer   How many dose of Covid-19 vaccine have your received and dates ? 2, 06/01/2019, 06/24/2019   Check all that apply: I live in a long-term care setting. no  I have been diagnosed with a medical condition(s). 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 no  I work in a long-term care facility, correctional facility, hospital, restaurant, retail setting, school, or other setting with high exposure to the public. no  4. Do you have a health condition or are you undergoing treatment that makes you moderately or severely immunocompromised? (This would include treatment for cancer or HIV, receipt of organ transplant, immunosuppressive therapy or high-dose corticosteroids, CAR-T-cell therapy, hematopoietic cell transplant [HCT], DiGeorge syndrome or Wiskott-Aldrich syndrome)  no  5. Have you received hematopoietic cell transplant (HCT) or CAR-T-cell therapies since receiving COVID-19 vaccine? no  6.  Have you ever had an allergic reaction: (This would include a severe reaction [ e.g., anaphylaxis] that required treatment with epinephrine or EpiPen or that caused you to go to the hospital.  It would also include an allergic reaction that occurred within 4 hours that caused hives, swelling, or respiratory distress, including wheezing.) A.  A previous dose of COVID-19 vaccine. no  B.  A vaccine or injectable therapy that contains multiple components, one of which is a COVID-19 vaccine component, but it is not known which component elicited the immediate reaction. no  C.  Are you allergic to polyethylene glycol? no  D.  Are you allergic to Polysorbate, which is found in some vaccines, film coated tablets and intravenous steroids?  no   7.  Have you ever had an allergic reaction to another vaccine (other than COVID-19 vaccine) or an injectable medication? (This would include a severe reaction [ e.g., anaphylaxis] that required treatment with epinephrine or EpiPen or that caused you to go to the hospital.  It would also include an allergic reaction that occurred within 4 hours that caused hives, swelling, or respiratory distress, including wheezing.)  no   8.  Have you ever had a severe allergic reaction (e.g., anaphylaxis) to something other than a component of the COVID-19 vaccine, or any vaccine or injectable medication?  This would include food, pet, venom, environmental, or oral medication allergies.  no   Check all that apply to you:  Am a male between ages 71 and 37 years old  no  Women 39 through 65 years of age can receive any FDA-authorized or -approved COVID-19 vaccine. However, they should be informed of the rare but increased risk of thrombosis with thrombocytopenia syndrome (TTS) after receipt of the [employer] Vaccine and the availability of other FDA-authorized and -approved COVID-19 vaccines. People who had TTS after a first dose of Janssen vaccine should not receive a subsequent dose of Janssen product    Am a male between ages 26 and 70 years old  no Males 5 through 65 years of age may receive the correct formulation of Pfizer-BioNTech COVID-19 vaccine. Males 18 and older can receive any FDA-authorized or -approved vaccine. However, people receiving an mRNA COVID-19 vaccine, especially males  12 through 65 years of age and their parents/legal representative (when relevant), should be informed of the risk of developing myocarditis (an inflammation of the heart muscle) or pericarditis (inflammation of the lining around the heart) after receipt of an mRNA vaccine. The risk of developing either  myocarditis or pericarditis after vaccination is low, and lower than the risk of myocarditis associated with SARS-CoV-2 infection in adolescents and adults. Vaccine recipients should be counseled about the need to seek care if symptoms of myocarditis or pericarditis develop after vaccination     Have a history of myocarditis or pericarditis  no Myocarditis or pericarditis after receipt of the first dose of an mRNA COVID-19 vaccine series but before administration of the second dose  Experts advise that people who develop myocarditis or pericarditis after a dose of an mRNA COVID-19 vaccine not receive a subsequent dose of any COVID-19 vaccine, until additional safety data are available.  Administration of a subsequent dose of COVID-19 vaccine before safety data are available can be considered in certain circumstances after the episode of myocarditis or pericarditis has completely resolved. Until additional data are available, some experts recommend a [employer] Dibbles COVID-19 vaccine be considered instead of an mRNA COVID-19 vaccine. Decisions about proceeding with a subsequent dose should include a conversation between the patient, their parent/legal representative (when relevant), and their clinical team, which may include a cardiologist.    Have been treated with monoclonal antibodies or convalescent serum to prevent or treat COVID-19  no Vaccination should be offered to people regardless of history of prior symptomatic or asymptomatic SARS-CoV-2 infection. There is no recommended minimal interval between infection and vaccination.  However, vaccination should be deferred if a patient received monoclonal antibodies or convalescent serum as treatment for COVID-19 or for post-exposure prophylaxis. This is a precautionary measure until additional information becomes available, to avoid interference of the antibody treatment with vaccine-induced immune responses.  Defer COVID-19 vaccination for 30 days when a  passive antibody product was used for post-exposure prophylaxis.  Defer COVID-19 vaccination for 90 days when a passive antibody product was used to treat COVID-19.     Diagnosed with Multisystem Inflammatory Syndrome (MIS-C or MIS-A) after a COVID-19 infection  no It is unknown if people with a history of MIS-C or MIS-A are at risk for a dysregulated immune response to COVID-19 vaccination.  People with a history of MIS-C or MIS-A may choose to be vaccinated. Considerations for vaccination may include:   Clinical recovery from MIS-C or MIS-A, including return to normal cardiac function   Personal risk of severe acute COVID-19 (e.g., age, underlying conditions)   High or substantial community transmission of SARS-CoV-2 and personal increased risk of reinfection.   Timing of any immunomodulatory therapies (general best practice guidelines for immunization can be consulted for more information [employer])   It has been 90 days or more since their diagnosis of MIS-C   Onset of MIS-C occurred before any COVID-19 vaccination   A conversation between the patient, their guardian(s), and their clinical team or a specialist may assist with COVID-19 vaccination decisions. Healthcare providers and health departments may also request a consultation from the Clinical Immunization Safety Assessment Project at [employer] [email or web address]/monitoring/[email or web address]/[email or web address].     Have a bleeding disorder  no Take a blood thinner  yes, patient reports he is on Warfarin daily. As with all vaccines, any COVID-19 vaccine product may be given to these patients, if a physician familiar with the patient's bleeding risk determines that the vaccine can be administered  intramuscularly with reasonable safety.  ACIP recommends the following technique for intramuscular vaccination in patients with bleeding disorders or taking blood thinners: a fine-gauge needle  (23-gauge or smaller caliber) should be used for the vaccination, followed by firm pressure on the site, without rubbing, for at least 2 minutes.  People who regularly take aspirin or anticoagulants as part of their routine medications do not need to stop these medications prior to receipt of any COVID-19 vaccine.    Have a history of heparin-induced thrombocytopenia (HIT)  no Although the etiology of TTS associated with the Alphonsa Overall COVID-19 vaccine is unclear, it appears to be similar to another rare immune-mediated syndrome, heparin-induced thrombocytopenia (HIT). People with a history of an episode of an immune-mediated syndrome characterized by thrombosis and thrombocytopenia, such as HIT, should be offered a currently FDA-approved or FDA-authorized mRNA COVID-19 vaccine if it has been ?90 days since their TTS resolved. After 90 days, patients may be vaccinated with any currently FDA-approved or FDA-authorized COVID-19 vaccine, including Janssen COVID-19 Vaccine. However, people who developed TTS after their initial Alphonsa Overall vaccine should not receive a Janssen booster dose.  Experts believe the following factors do not make people more susceptible to TTS after receipt of the [employer]. People with these conditions can be vaccinated with any FDA-authorized or - approved COVID-19 vaccine, including the [employer] COVID-19 Vaccine:   A prior history of venous thromboembolism   Risk factors for venous thromboembolism (e.g., inherited or acquired thrombophilia including Factor V Leiden; prothrombin gene 20210A mutation; antiphospholipid syndrome; protein C, protein S or antithrombin deficiency   A prior history of other types of thromboses not associated with thrombocytopenia   Pregnancy, post-partum status, or receipt of hormonal contraceptives (e.g., combined oral contraceptives, patch, ring)   Additional recipient education materials can be found at http://gutierrez-robinson.[email or web address]  vaccines/safety/JJUpdate.html.    Am currently pregnant or breastfeeding  no Vaccination is recommended for all people aged 79 years and older, including people that are:   Pregnant   Breastfeeding   Trying to get pregnant now or who might become pregnant in the future   Pregnant, breastfeeding, and post-partum people 90 through 65 years of age should be aware of the rare risk of TTS after receipt of the Alphonsa Overall COVID-19 Vaccine and the availability of other FDA-authorized or -approved COVID-19 vaccines (i.e., mRNA vaccines).    Have received dermal fillers  no FDA-authorized or -approved COVID-19 vaccines can be administered to people who have received injectable dermal fillers who have no contraindications for vaccination.  Infrequently, these people might experience temporary swelling at or near the site of filler injection (usually the face or lips) following administration of a dose of an mRNA COVID-19 vaccine. These people should be advised to contact their healthcare provider if swelling develops at or near the site of dermal filler following vaccination.     Have a history of Guillain-Barr Syndrome (GBS)  no People with a history of GBS can receive any FDA-authorized or -approved COVID-19 vaccine. However, given the possible association between the [employer] and an increased risk of GBS, a patient with a history of GBS and their clinical team should discuss the availability of mRNA vaccines to offer protection against COVID-19. The highest risk has been observed in men aged 72-64 years with symptoms of GBS beginning within 42 days after Alphonsa Overall COVID-19 vaccination.  People who had GBS after receiving Alphonsa Overall vaccine should be made aware of the option to receive an mRNA COVID-19 vaccine booster at least  2 months (8 weeks) after the Janssen dose. However, [employer] Dibbles vaccine may be used as a booster, particularly if GBS occurred more than 42 days after vaccination or was related  to a non-vaccine factor. Prior to booster vaccination, a conversation between the patient and their clinical team may assist with decisions about use of a COVID-19 booster dose, including the timing of administration     Postvaccination Observation Times for People without Contraindications to Covid 19 Vaccination.  30 minutes:  People with a history of: A contraindication to another type of COVID-19 vaccine product (i.e., mRNA or viral vector COVID-19 vaccines)   Immediate (within 4 hours of exposure) non-severe allergic reaction to a COVID-19 vaccine or injectable therapies   Anaphylaxis due to any cause   Immediate allergic reaction of any severity to a non-COVID-19 vaccine   15 minutes: All other people  This patient is a 65 y.o. male that meets the FDA criteria to receive homebound vaccination. Patient or parent/caregiver understands they have the option to accept or refuse homebound vaccination.  Patient passed the pre-screening checklist and would like to proceed with homebound vaccination.  Based on questionnaire above, I recommend the patient be observed for 15 minutes.  There are an estimated #0 other household members/caregivers who are also interested in receiving the vaccine.    The patient has been confirmed homebound and eligible for homebound vaccination with the considerations outlined above. I will send the patient's information to our scheduling team who will reach out to schedule the patient and potential caregiver/family members for homebound vaccination.    Skip Mayer 08/10/2020 8:58 AM

## 2020-08-24 ENCOUNTER — Ambulatory Visit: Payer: Medicare Other

## 2020-08-24 ENCOUNTER — Ambulatory Visit: Payer: Medicare Other | Attending: Critical Care Medicine

## 2020-08-24 DIAGNOSIS — Z23 Encounter for immunization: Secondary | ICD-10-CM

## 2020-08-24 NOTE — Progress Notes (Signed)
   Covid-19 Vaccination Clinic  Name:  [name]    MRN: [id number] DOB: [birth year]  08/24/2020  [name] was observed post Covid-19 immunization for 15 minutes without incident. He was provided with Vaccine Information Sheet and instruction to access the V-Safe system.   [name] was instructed to call 911 with any severe reactions post vaccine: Difficulty breathing  Swelling of face and throat  A fast heartbeat  A bad rash all over body  Dizziness and weakness   Immunizations Administered     Name Date Dose VIS Date Route   PFIZER Comrnaty(Gray TOP) Covid-19 Vaccine 08/24/2020 12:00 PM 0.3 mL 02/11/2020 Intramuscular   Manufacturer: [employer], [employer]   Lot: [id number]   NDC: [phone]

## 2022-08-15 ENCOUNTER — Other Ambulatory Visit: Payer: Self-pay | Admitting: Physical Medicine & Rehabilitation

## 2022-08-15 DIAGNOSIS — M5441 Lumbago with sciatica, right side: Secondary | ICD-10-CM

## 2022-09-13 ENCOUNTER — Ambulatory Visit
Admission: RE | Admit: 2022-09-13 | Discharge: 2022-09-13 | Disposition: A | Discharge status: Home / Self Care | Payer: Medicare PPO | Source: Ambulatory Visit | Attending: Physical Medicine & Rehabilitation | Admitting: Physical Medicine & Rehabilitation

## 2022-09-13 DIAGNOSIS — M5442 Lumbago with sciatica, left side: Secondary | ICD-10-CM | POA: Diagnosis not present

## 2022-09-13 DIAGNOSIS — M5441 Lumbago with sciatica, right side: Secondary | ICD-10-CM | POA: Insufficient documentation

## 2022-09-30 ENCOUNTER — Observation Stay
Admission: EM | Admit: 2022-09-30 | Discharge: 2022-10-01 | Disposition: A | Discharge status: Home / Self Care | Payer: Medicare PPO | Attending: Internal Medicine | Admitting: Internal Medicine

## 2022-09-30 ENCOUNTER — Emergency Department: Payer: Medicare PPO

## 2022-09-30 ENCOUNTER — Other Ambulatory Visit: Payer: Self-pay

## 2022-09-30 DIAGNOSIS — F32A Depression, unspecified: Secondary | ICD-10-CM | POA: Diagnosis present

## 2022-09-30 DIAGNOSIS — G893 Neoplasm related pain (acute) (chronic): Secondary | ICD-10-CM

## 2022-09-30 DIAGNOSIS — C7951 Secondary malignant neoplasm of bone: Secondary | ICD-10-CM | POA: Diagnosis not present

## 2022-09-30 DIAGNOSIS — Z86711 Personal history of pulmonary embolism: Secondary | ICD-10-CM | POA: Insufficient documentation

## 2022-09-30 DIAGNOSIS — M549 Dorsalgia, unspecified: Secondary | ICD-10-CM | POA: Diagnosis present

## 2022-09-30 DIAGNOSIS — Z79899 Other long term (current) drug therapy: Secondary | ICD-10-CM | POA: Diagnosis not present

## 2022-09-30 DIAGNOSIS — C799 Secondary malignant neoplasm of unspecified site: Secondary | ICD-10-CM | POA: Diagnosis not present

## 2022-09-30 DIAGNOSIS — Z6841 Body Mass Index (BMI) 40.0 and over, adult: Secondary | ICD-10-CM | POA: Insufficient documentation

## 2022-09-30 DIAGNOSIS — Z7901 Long term (current) use of anticoagulants: Secondary | ICD-10-CM | POA: Diagnosis not present

## 2022-09-30 DIAGNOSIS — I2699 Other pulmonary embolism without acute cor pulmonale: Secondary | ICD-10-CM | POA: Diagnosis present

## 2022-09-30 DIAGNOSIS — Z89612 Acquired absence of left leg above knee: Secondary | ICD-10-CM | POA: Insufficient documentation

## 2022-09-30 DIAGNOSIS — R2689 Other abnormalities of gait and mobility: Secondary | ICD-10-CM | POA: Insufficient documentation

## 2022-09-30 DIAGNOSIS — E669 Obesity, unspecified: Secondary | ICD-10-CM | POA: Diagnosis present

## 2022-09-30 DIAGNOSIS — R197 Diarrhea, unspecified: Secondary | ICD-10-CM | POA: Diagnosis not present

## 2022-09-30 DIAGNOSIS — C61 Malignant neoplasm of prostate: Secondary | ICD-10-CM | POA: Diagnosis present

## 2022-09-30 DIAGNOSIS — Z89611 Acquired absence of right leg above knee: Secondary | ICD-10-CM

## 2022-09-30 DIAGNOSIS — I10 Essential (primary) hypertension: Secondary | ICD-10-CM | POA: Diagnosis not present

## 2022-09-30 HISTORY — DX: Acquired absence of left leg above knee: Z89.611

## 2022-09-30 HISTORY — DX: Depression, unspecified: F32.A

## 2022-09-30 HISTORY — DX: Essential (primary) hypertension: I10

## 2022-09-30 LAB — CBC
HCT: 38.9 % — ABNORMAL LOW (ref 39.0–52.0)
Hemoglobin: 12.8 g/dL — ABNORMAL LOW (ref 13.0–17.0)
MCH: 29.8 pg (ref 26.0–34.0)
MCHC: 32.9 g/dL (ref 30.0–36.0)
MCV: 90.5 fL (ref 80.0–100.0)
Platelets: 214 10*3/uL (ref 150–400)
RBC: 4.3 MIL/uL (ref 4.22–5.81)
RDW: 13.8 % (ref 11.5–15.5)
WBC: 6.4 10*3/uL (ref 4.0–10.5)
nRBC: 0 % (ref 0.0–0.2)

## 2022-09-30 LAB — COMPREHENSIVE METABOLIC PANEL
ALT: 10 U/L (ref 0–44)
AST: 26 U/L (ref 15–41)
Albumin: 3.4 g/dL — ABNORMAL LOW (ref 3.5–5.0)
Alkaline Phosphatase: 653 U/L — ABNORMAL HIGH (ref 38–126)
Anion gap: 7 (ref 5–15)
BUN: 8 mg/dL (ref 8–23)
CO2: 23 mmol/L (ref 22–32)
Calcium: 8.5 mg/dL — ABNORMAL LOW (ref 8.9–10.3)
Chloride: 106 mmol/L (ref 98–111)
Creatinine, Ser: 0.89 mg/dL (ref 0.61–1.24)
GFR, Estimated: >60 mL/min (ref >60)
Glucose, Bld: 92 mg/dL (ref 70–99)
Potassium: 3.8 mmol/L (ref 3.5–5.1)
Sodium: 136 mmol/L (ref 135–145)
Total Bilirubin: 1.6 mg/dL — ABNORMAL HIGH (ref 0.3–1.2)
Total Protein: 7 g/dL (ref 6.5–8.1)

## 2022-09-30 LAB — PSA: Prostatic Specific Antigen: 709.78 ng/mL — ABNORMAL HIGH (ref 0.00–4.00)

## 2022-09-30 LAB — PROTIME-INR
INR: 2.8 — ABNORMAL HIGH (ref 0.8–1.2)
Prothrombin Time: 29.8 seconds — ABNORMAL HIGH (ref 11.4–15.2)

## 2022-09-30 LAB — APTT: aPTT: 46 seconds — ABNORMAL HIGH (ref 24–36)

## 2022-09-30 LAB — HIV ANTIBODY (ROUTINE TESTING W REFLEX): HIV Screen 4th Generation wRfx: NONREACTIVE

## 2022-09-30 MED ORDER — BUPROPION HCL ER (XL) 300 MG PO TB24
300.0000 mg | ORAL_TABLET | Freq: Every day | ORAL | Status: DC
  Filled 2022-09-30: qty 1

## 2022-09-30 MED ORDER — FENTANYL CITRATE PF 50 MCG/ML IJ SOSY
25.0000 ug | PREFILLED_SYRINGE | INTRAMUSCULAR | Status: DC | PRN
  Administered 2022-09-30: 25 ug via INTRAVENOUS
  Filled 2022-09-30: qty 1

## 2022-09-30 MED ORDER — MORPHINE SULFATE (PF) 2 MG/ML IV SOLN
2.0000 mg | INTRAVENOUS | Status: DC | PRN
  Administered 2022-09-30: 2 mg via INTRAVENOUS
  Filled 2022-09-30: qty 1

## 2022-09-30 MED ORDER — HEPARIN SODIUM (PORCINE) 5000 UNIT/ML IJ SOLN: 5000.0000 [IU] | Freq: Three times a day (TID) | INTRAMUSCULAR | Status: DC

## 2022-09-30 MED ORDER — OXYCODONE-ACETAMINOPHEN 5-325 MG PO TABS
1.0000 | ORAL_TABLET | ORAL | Status: DC | PRN
  Administered 2022-09-30 – 2022-10-01 (×3): 1 via ORAL
  Filled 2022-09-30 (×3): qty 1

## 2022-09-30 MED ORDER — ONDANSETRON HCL 4 MG/2ML IJ SOLN: 4.0000 mg | Freq: Three times a day (TID) | INTRAMUSCULAR | Status: DC | PRN

## 2022-09-30 MED ORDER — LIDOCAINE 5 % EX PTCH
1.0000 | MEDICATED_PATCH | CUTANEOUS | Status: DC
  Administered 2022-09-30: 1 via TRANSDERMAL
  Filled 2022-09-30 (×2): qty 1

## 2022-09-30 MED ORDER — LISINOPRIL 20 MG PO TABS
40.0000 mg | ORAL_TABLET | Freq: Every day | ORAL | Status: DC
  Administered 2022-10-01: 40 mg via ORAL
  Filled 2022-09-30: qty 2

## 2022-09-30 MED ORDER — LISINOPRIL 20 MG PO TABS
20.0000 mg | ORAL_TABLET | Freq: Two times a day (BID) | ORAL | Status: DC
  Filled 2022-09-30: qty 1

## 2022-09-30 MED ORDER — ONDANSETRON HCL 4 MG/2ML IJ SOLN
4.0000 mg | Freq: Once | INTRAMUSCULAR | Status: AC
  Administered 2022-09-30: 4 mg via INTRAVENOUS
  Filled 2022-09-30: qty 2

## 2022-09-30 MED ORDER — MORPHINE SULFATE (PF) 4 MG/ML IV SOLN
4.0000 mg | Freq: Once | INTRAVENOUS | Status: AC
  Administered 2022-09-30: 4 mg via INTRAVENOUS
  Filled 2022-09-30: qty 1

## 2022-09-30 MED ORDER — ACETAMINOPHEN 325 MG PO TABS: 650.0000 mg | ORAL_TABLET | Freq: Four times a day (QID) | ORAL | Status: DC | PRN

## 2022-09-30 MED ORDER — LIDOCAINE 5 % EX PTCH
1.0000 | MEDICATED_PATCH | CUTANEOUS | Status: DC
  Administered 2022-10-01: 1 via TRANSDERMAL
  Filled 2022-09-30: qty 1

## 2022-09-30 MED ORDER — IOHEXOL 300 MG/ML  SOLN
100.0000 mL | Freq: Once | INTRAMUSCULAR | Status: AC | PRN
  Administered 2022-09-30: 100 mL via INTRAVENOUS

## 2022-09-30 MED ORDER — METHOCARBAMOL 500 MG PO TABS
500.0000 mg | ORAL_TABLET | Freq: Three times a day (TID) | ORAL | Status: DC | PRN
  Administered 2022-09-30: 500 mg via ORAL
  Filled 2022-09-30: qty 1

## 2022-09-30 NOTE — Consult Note (Signed)
[facility] CONSULT NOTE  Patient Care Team: Dione Housekeeper, MD as PCP - General (Family Medicine)  CHIEF COMPLAINTS/PURPOSE OF CONSULTATION: Abnormal scans.   Oncology History   No history exists.     HISTORY OF PRESENTING ILLNESS:  [name] 67 y.o.  male multiple medical problems including of s/p of bilateral AKA [as per patient history of infections], PE on Coumadin, HTN, depression, who presents with back pain.  Patient noted to have ongoing low back pain for the last 1 to 2 months.  It has been radiating bilaterally to the gluteal area.  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   Of note, patient was seen PCP and had MRI of lumbar spine on 7/11, which showed innumerable metastasized bone lesions.  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   Patient is referred to ED by PCP for further evaluation and treatment since patient has difficulty in transportation due to history of bilateral BKA.   Subsequently patient sought care in the emergency room.  Patient had a CT scan chest and pelvis that showed-no acute process in the distal except 10 mm extrailiac lymph node.  No significant proximal abnormality noted.  Oncology has been consulted for further evaluation recommendations.  Review of Systems  Constitutional:  Positive for malaise/fatigue and weight loss. Negative for chills, diaphoresis and fever.  HENT:  Negative for nosebleeds and sore throat.   Eyes:  Negative for double vision.  Respiratory:  Negative for cough, hemoptysis, sputum production, shortness of breath and wheezing.   Cardiovascular:  Negative for chest pain, palpitations, orthopnea and leg swelling.  Gastrointestinal:  Negative for abdominal pain, blood in stool, constipation, diarrhea, heartburn, melena, nausea and vomiting.  Genitourinary:  Negative for dysuria, frequency and urgency.   Musculoskeletal:  Positive for back pain and joint pain.  Skin: Negative.  Negative for itching and rash.  Neurological:  Negative for dizziness, tingling, focal weakness, weakness and headaches.  Endo/Heme/Allergies:  Does not bruise/bleed easily.  Psychiatric/Behavioral:  Negative for depression. The patient is not nervous/anxious and does not have insomnia.     MEDICAL HISTORY:  Past Medical History:  Diagnosis Date   Depression    History of left knee replacement    HTN (hypertension)    PE (pulmonary thromboembolism) (HCC)    S/P AKA (above knee amputation) bilateral (HCC)     SURGICAL HISTORY: Past Surgical History:  Procedure Laterality Date   s/p of bilateral AKA      SOCIAL HISTORY: Social History   Socioeconomic History   Marital status: Married    Spouse name: Not on file   Number of children: Not on file   Years of education: Not on file   Highest education level: Not on file  Occupational History   Not on file  Tobacco Use   Smoking status: Never   Smokeless tobacco: Never  Substance and Sexual Activity   Alcohol use: No   Drug use: Never   Sexual activity: Not on file  Other Topics Concern   Not on file  Social History Narrative   Not on file   Social Determinants of Health   Financial Resource Strain: High Risk (07/28/2021)   Received from [facility], [facility]   Overall Financial Resource Strain (CARDIA)    Difficulty of Paying Living Expenses: Very hard  Food Insecurity: No Food Insecurity (09/30/2022)   Hunger Vital  Sign    Worried About Programme researcher, broadcasting/film/video in the Last Year: Never true    Ran Out of Food in the Last Year: Never true  Transportation Needs: No Transportation Needs (09/30/2022)   PRAPARE - Administrator, Civil Service (Medical): No    Lack of Transportation (Non-Medical): No  Physical Activity: Sufficiently Active (07/28/2021)   Received from [facility],  [facility]   Exercise Vital Sign    Days of Exercise per Week: 7 days    Minutes of Exercise per Session: 60 min  Stress: Stress Concern Present (08/01/2021)   Received from [facility], [facility]   [employer] of Occupational Health - Occupational Stress Questionnaire    Feeling of Stress : Very much  Social Connections: Socially Isolated (08/01/2021)   Received from [facility], [facility]   Social Connection and Isolation Panel [NHANES]    Frequency of Communication with Friends and Family: Once a week    Frequency of Social Gatherings with Friends and Family: Never    Attends Religious Services: Never    Database administrator or Organizations: Yes    Attends Banker Meetings: Never    Marital Status: Separated  Intimate Partner Violence: Not At Risk (09/30/2022)   Humiliation, Afraid, Rape, and Kick questionnaire    Fear of Current or Ex-Partner: No    Emotionally Abused: No    Physically Abused: No    Sexually Abused: No    FAMILY HISTORY: Family History  Problem Relation Age of Onset   Lung cancer Father     ALLERGIES:  is allergic to bacitracin, vancomycin, ketorolac, and spironolactone.  MEDICATIONS:  Current Facility-Administered Medications  Medication Dose Route Frequency Provider Last Rate Last Admin   acetaminophen (TYLENOL) tablet 650 mg  650 mg Oral Q6H PRN Lorretta Harp, MD       [START ON 10/01/2022] buPROPion (WELLBUTRIN XL) 24 hr tablet 300 mg  300 mg Oral Daily Lorretta Harp, MD       lidocaine (LIDODERM) 5 % 1 patch  1 patch Transdermal Q24H Lorretta Harp, MD   1 patch at 09/30/22 1139   [START ON 10/01/2022] lisinopril (ZESTRIL) tablet 40 mg  40 mg Oral Daily Otelia Sergeant, RPH       methocarbamol (ROBAXIN) tablet 500 mg  500 mg Oral Q8H PRN Lorretta Harp, MD   500 mg at 09/30/22 2032   morphine (PF) 2 MG/ML injection 2 mg  2 mg Intravenous Q4H PRN Mansy,  Jan A, MD       ondansetron (ZOFRAN) injection 4 mg  4 mg Intravenous Q8H PRN Lorretta Harp, MD       oxyCODONE-acetaminophen (PERCOCET/ROXICET) 5-325 MG per tablet 1 tablet  1 tablet Oral Q4H PRN Lorretta Harp, MD   1 tablet at 09/30/22 2032    PHYSICAL EXAMINATION:  Vitals:   09/30/22 1614 09/30/22 2028  BP: (!) 152/97 (!) 123/94  Pulse: 74 73  Resp:  20  Temp: 98.5 F (36.9 C) 98.4 F (36.9 C)  SpO2: 100% 98%   Filed Weights   09/30/22 0852 09/30/22 1225  Weight: 285 lb (129.3 kg) 289 lb (131.1 kg)   Bilateral above-knee amputations.  Physical Exam Vitals and nursing note reviewed.  HENT:     Head: Normocephalic and atraumatic.     Mouth/Throat:     Pharynx: Oropharynx is clear.  Eyes:  Extraocular Movements: Extraocular movements intact.     Pupils: Pupils are equal, round, and reactive to light.  Cardiovascular:     Rate and Rhythm: Normal rate and regular rhythm.  Pulmonary:     Comments: Decreased breath sounds bilaterally.  Abdominal:     Palpations: Abdomen is soft.  Musculoskeletal:        General: Normal range of motion.     Cervical back: Normal range of motion.  Skin:    General: Skin is warm.  Neurological:     General: No focal deficit present.     Mental Status: He is alert and oriented to person, place, and time.  Psychiatric:        Behavior: Behavior normal.        Judgment: Judgment normal.     LABORATORY DATA:  I have reviewed the data as listed Lab Results  Component Value Date   WBC 6.4 09/30/2022   HGB 12.8 (L) 09/30/2022   HCT 38.9 (L) 09/30/2022   MCV 90.5 09/30/2022   PLT 214 09/30/2022   Recent Labs    09/30/22 0921  NA 136  K 3.8  CL 106  CO2 23  GLUCOSE 92  BUN 8  CREATININE 0.89  CALCIUM 8.5*  GFRNONAA >60  PROT 7.0  ALBUMIN 3.4*  AST 26  ALT 10  ALKPHOS 653*  BILITOT 1.6*    RADIOGRAPHIC STUDIES: I have personally reviewed the radiological images as listed and agreed with the findings in the report. CT  CHEST ABDOMEN PELVIS W CONTRAST  Result Date: 09/30/2022 CLINICAL DATA:  Abdominal and pelvic pain and swelling. Bone metastases of unknown primary. * Tracking Code: BO * EXAM: CT CHEST, ABDOMEN, AND PELVIS WITH CONTRAST TECHNIQUE: Multidetector CT imaging of the chest, abdomen and pelvis was performed following the standard protocol during bolus administration of intravenous contrast. RADIATION DOSE REDUCTION: This exam was performed according to the departmental dose-optimization program which includes automated exposure control, adjustment of the mA and/or kV according to patient size and/or use of iterative reconstruction technique. CONTRAST:  OMNIPAQUE IOHEXOL 300 MG/ML  SOLN COMPARISON:  None Available. FINDINGS: CT CHEST FINDINGS Cardiovascular: No acute findings. Mediastinum/Lymph Nodes: No masses or pathologically enlarged lymph nodes identified. Lungs/Pleura: No suspicious pulmonary nodules or masses identified. No evidence of infiltrate or pleural effusion. Musculoskeletal: Sclerotic bone metastases are seen throughout the thorax. CT ABDOMEN AND PELVIS FINDINGS Hepatobiliary: No masses identified. Gallstones are seen, however there is no evidence of cholecystitis or biliary dilatation. Pancreas:  No mass or inflammatory changes. Spleen:  Within normal limits in size and appearance. Adrenals/Urinary tract: No suspicious masses or hydronephrosis. Stomach/Bowel: No evidence of obstruction, inflammatory process, or abnormal fluid collections. Vascular/Lymphatic: 10 mm right external iliac lymph node identified on image 103/2. No other pathologically enlarged lymph nodes identified. No acute vascular findings. Reproductive: Normal size prostate gland. No significant abnormality identified. Other:  None. Musculoskeletal: Numerous diffuse sclerotic bone metastases are seen. IMPRESSION: Diffuse sclerotic bone metastases. 10 mm right external iliac lymph node. Metastatic disease cannot be excluded. No  other sites of metastatic disease identified. No definite primary malignancy identified. Suggest correlation with PSA. Cholelithiasis. No radiographic evidence of cholecystitis. Electronically Signed   By: Danae Orleans M.D.   On: 09/30/2022 10:39   MR LUMBAR SPINE WO CONTRAST  Result Date: 09/20/2022 CLINICAL DATA:  Low back pain for 6 months extending into the buttocks. EXAM: MRI LUMBAR SPINE WITHOUT CONTRAST TECHNIQUE: Multiplanar, multisequence MR imaging of the lumbar spine was  performed. No intravenous contrast was administered. COMPARISON:  None Available. FINDINGS: Segmentation:  5 lumbar type vertebrae. Alignment:  Physiologic Vertebrae: Multiple bone lesions seen affecting every covered level (T12-S2) with numerous deposits at most levels.Both ilia are involved. The largest is a confluent mass centered at the S1 level with ventral epidural extension contiguous with the posterior and inferior corner of L5 and extending inferiorly into the S2 level. This lesion causes advanced thecal sac stenosis at the level of L5-S1 and S1. Conus medullaris and cauda equina: Conus extends to the L1 level. Conus and cauda equina appear normal. Paraspinal and other soft tissues: Left hydronephrosis versus large cysts. No noted retroperitoneal adenopathy. Disc levels: Generalized disc bulging and facet spurring with endplate spurs. No degenerative impingement. These results will be called to the ordering clinician or representative by the Radiologist Assistant, and communication documented in the PACS or [employer]. IMPRESSION: Metastatic pattern with innumerable bone lesions throughout the covered spine, most notably at the L5-S1 level where there is advanced thecal sac effacement due to bulky ventral epidural tumor. Electronically Signed   By: Tiburcio Pea M.D.   On: 09/20/2022 17:28     No problem-specific Assessment & Plan notes found for this encounter.  67 year old male patient with multiple medical  problems s/p of bilateral AKA [as per patient history of infections], PE on Coumadin, HTN, depression, who presents with back pain-imaging noted to have multiple sclerotic bone lesions in the vertebrae.  # Multiple sclerotic bone lesions-likely cause of patient's low back pain.  Highly concerning for malignancy.   # Given the absence of any primary origin of the present malignancy-in the sclerotic nature of the bone lesions recommend checking PSA.   # If PSA is elevated-I think it is reasonable to forego any biopsy and to proceed with treatment.  Treatment would include ADT-along with chemotherapy.  Thank you Dr. Clyde Lundborg for allowing me to participate in the care of your pleasant patient. Please do not hesitate to contact me with questions or concerns in the interim.  # I reviewed the blood work- with the patient in detail; also reviewed the imaging independently [as summarized above]; and with the patient in detail. Above plan of care was discussed with patient/family in detail.  My contact information was given to the patient/family.     Earna Coder, MD 09/30/2022 10:53 PM

## 2022-09-30 NOTE — ED Provider Notes (Addendum)
[facility] Provider Note    Event Date/Time   First MD Initiated Contact with Patient 09/30/22 [phone]     (approximate)   History   Back pain  HPI  [name] is a 67 y.o. male who presents with complaints of back pain.  Patient has bilateral lower leg amputations, is wheelchair-bound and has significant difficulties with transport.  Had MRI performed on July 11 per review of records which demonstrated numerous lesions in the lumbar spine concerning for metastatic cancer.  No known primary.  Has not seen oncology yet.  Referred to the emergency department by his doctor for further evaluation.  Patient reports over a month of increasing pain in his groin, back and worsening phantom limb pain.  No fevers or ulcerations or rashes reported     Physical Exam   Triage Vital Signs: ED Triage Vitals  Encounter Vitals Group     BP 09/30/22 0855 (!) 155/91     Systolic BP Percentile --      Diastolic BP Percentile --      Pulse Rate 09/30/22 0853 73     Resp 09/30/22 0853 18     Temp 09/30/22 0853 97.7 F (36.5 C)     Temp src --      SpO2 09/30/22 0853 100 %     Weight 09/30/22 0852 129.3 kg (285 lb)     Height --      Head Circumference --      Peak Flow --      Pain Score 09/30/22 0852 10     Pain Loc --      Pain Education --      Exclude from Growth Chart --     Most recent vital signs: Vitals:   09/30/22 0855 09/30/22 1130  BP: (!) 155/91 (!) 150/93  Pulse:  67  Resp:  18  Temp:    SpO2:  97%     General: Awake, no distress.  CV:  Good peripheral perfusion.  Resp:  Normal effort.  Abd:  No distention.  Other:     ED Results / Procedures / Treatments   Labs (all labs ordered are listed, but only abnormal results are displayed) Labs Reviewed  CBC - Abnormal; Notable for the following components:      Result Value   Hemoglobin 12.8 (*)    HCT 38.9 (*)    All other components within normal limits  COMPREHENSIVE METABOLIC PANEL -  Abnormal; Notable for the following components:   Calcium 8.5 (*)    Albumin 3.4 (*)    Alkaline Phosphatase 653 (*)    Total Bilirubin 1.6 (*)    All other components within normal limits  PSA  PROTIME-INR  APTT  HIV ANTIBODY (ROUTINE TESTING W REFLEX)     EKG  ED ECG REPORT I, Jene Every, the attending physician, personally viewed and interpreted this ECG.  Date: 09/30/2022  Rhythm: normal sinus rhythm QRS Axis: normal Intervals: normal ST/T Wave abnormalities: normal Narrative Interpretation: no evidence of acute ischemia    RADIOLOGY CT abdomen pelvis viewed interpret by me, sclerotic lesions noted in the spine, no clear primary    PROCEDURES:  Critical Care performed:   Procedures   MEDICATIONS ORDERED IN ED: Medications  morphine (PF) 2 MG/ML injection 2 mg (has no administration in time range)  oxyCODONE-acetaminophen (PERCOCET/ROXICET) 5-325 MG per tablet 1 tablet (has no administration in time range)  methocarbamol (ROBAXIN) tablet 500 mg (has no administration  in time range)  lidocaine (LIDODERM) 5 % 1 patch (has no administration in time range)  ondansetron (ZOFRAN) injection 4 mg (has no administration in time range)  acetaminophen (TYLENOL) tablet 650 mg (has no administration in time range)  heparin injection 5,000 Units (has no administration in time range)  morphine (PF) 4 MG/ML injection 4 mg (4 mg Intravenous Given 09/30/22 0924)  ondansetron (ZOFRAN) injection 4 mg (4 mg Intravenous Given 09/30/22 0924)  iohexol (OMNIPAQUE) 300 MG/ML solution 100 mL (100 mLs Intravenous Contrast Given 09/30/22 0954)     IMPRESSION / MDM / ASSESSMENT AND PLAN / ED COURSE  I reviewed the triage vital signs and the nursing notes. Patient's presentation is most consistent with severe exacerbation of chronic illness.  Patient presents with likely metastatic cancer to the spine.  Unclear primary, possibly prostatic.  Significant pain related to this.  Will treat  with IV morphine, IV Zofran, obtain labs, PSA, sent for CT abdomen chest abdomen pelvis.  CT chest abdomen pelvis does not clearly delineate primary, discussed with Dr. Donneta Romberg of oncology, agrees with PSA and admission.  I have discussed with the hospitalist for admission      FINAL CLINICAL IMPRESSION(S) / ED DIAGNOSES   Final diagnoses:  Malignant neoplasm metastatic to bone [facility])     Rx / DC Orders   ED Discharge Orders     None        Note:  This document was prepared using Dragon voice recognition software and may include unintentional dictation errors.   Jene Every, MD 09/30/22 1133    Jene Every, MD 09/30/22 [phone]

## 2022-09-30 NOTE — H&P (Signed)
History and Physical    [name] ION:[id number] DOB: [birth year] DOA: 09/30/2022  Referring MD/NP/PA:   PCP: [name] Housekeeper, MD   Patient coming from:  The patient is coming from home.     Chief Complaint: back pain    HPI: [name] is a 67 y.o. male with medical history significant of s/p of bilateral AKA, PE on Coumadin, HTN, depression, who presents with back pain.  Patient states that he has back pain for more than 6 weeks, which is located in lower back, constant, sharp, moderate to severe, radiating to the bilateral gluteal area, no leg numbness or tingling.  He has worsening phantom pain in AKA stumps recently.  Denies chest pain, cough, shortness of breath.  No fever or chills.  Patient has nausea and diarrhea. No vomiting or abdominal pain.  Patient states that he has 2 or 3 times of watery bowel movement each day.  No symptoms of UTI.  Of note, patient was seen PCP and had MRI of lumbar spine on 7/11, which showed innumerable metastasized bone lesions.  Patient is referred to ED by PCP for further evaluation and treatment since patient has difficulty in transportation due to history of bilateral BKA   MRI-L spin on 09/13/22 Metastatic pattern with innumerable bone lesions throughout the covered spine, most notably at the L5-S1 level where there is advanced thecal sac effacement due to bulky ventral epidural tumor.   Data reviewed independently and ED Course: pt was found to have WBC 6.4, GFR>60, temperature normal, blood pressure 155/91, heart rate 73, RR 18, oxygen saturation 100% on room air.  Patient is placed on MedSurg bed for observation. Dr. Donneta Romberg of oncology is consulted.   CT-chest/abd/pelvis: Diffuse sclerotic bone metastases.   10 mm right external iliac lymph node. Metastatic disease cannot be excluded.   No other sites of metastatic disease identified. No definite primary malignancy identified. Suggest correlation with PSA.    Cholelithiasis. No radiographic evidence of cholecystitis.     EKG:   Not done in ED, will get one.     59M, hx of s/p of bilateral AKA, PE on Coumadin, HTN, depression, presents with back pain for 6 weeks, outpt MRI-L spin showed metastasized to bones lesions. CT-chest/abd/pelvis showed diffuse sclerotic bone metastasized disease, no origin site of malignancy identified. EDP told me that they already contacted you. I just want to make sure to get a formal consult.    Review of Systems:   General: no fevers, chills, no body weight gain,  has fatigue HEENT: no blurry vision, hearing changes or sore throat Respiratory: no dyspnea, coughing, wheezing CV: no chest pain, no palpitations GI: has nausea and diarrhea, no vomiting, abdominal pain, constipation GU: no dysuria, burning on urination, increased urinary frequency, hematuria  Ext: no leg edema.  Neuro: no unilateral weakness, numbness, or tingling, no vision change or hearing loss Skin: no rash, no skin tear. MSK: has back pain and phantom pain in AKA stumps bilaterally Heme: No easy bruising.  Travel history: No recent long distant travel.   Allergy:  Allergies  Allergen Reactions   Bacitracin Hives   Vancomycin Other (See Comments) and Rash    Red man syndrome.   Ketorolac     Other Reaction(s): Kidney Disorder  Other reaction(s): Kidney Disorder   Spironolactone     Other Reaction(s): Unknown  Other reaction(s): Unknown    Past Medical History:  Diagnosis Date   Depression    History of left knee replacement  HTN (hypertension)    PE (pulmonary thromboembolism) (HCC)    S/P AKA (above knee amputation) bilateral (HCC)     Past Surgical History:  Procedure Laterality Date   s/p of bilateral AKA      Social History:  reports that he has never smoked. He has never used smokeless tobacco. He reports that he does not drink alcohol and does not use drugs.  Family History:  Family History  Problem  Relation Age of Onset   Lung cancer Father      Prior to Admission medications   Not on File    Physical Exam: Vitals:   09/30/22 0853 09/30/22 0855 09/30/22 1130 09/30/22 1225  BP:  (!) 155/91 (!) 150/93 (!) 136/96  Pulse: 73  67 72  Resp: 18  18 16   Temp: 97.7 F (36.5 C)   98 F (36.7 C)  SpO2: 100%  97% 99%  Weight:       General: Not in acute distress HEENT:       Eyes: PERRL, EOMI, no jaundice       ENT: No discharge from the ears and nose, no pharynx injection, no tonsillar enlargement.        Neck: No JVD, no bruit, no mass felt. Heme: No neck lymph node enlargement. Cardiac: S1/S2, RRR, No murmurs, No gallops or rubs. Respiratory: No rales, wheezing, rhonchi or rubs. GI: Soft, nondistended, nontender, no rebound pain, no organomegaly, BS present. GU: No hematuria/P Ext: No pitting leg edema bilaterally. S/p of AKA Musculoskeletal: No joint deformities, No joint redness or warmth, no limitation of ROM in spin. Has tenderness in lower back Skin: No rashes.  Neuro: Alert, oriented X3, cranial nerves II-XII grossly intact, moves all extremities. Psych: Patient is not psychotic, no suicidal or hemocidal ideation.  Labs on Admission: I have personally reviewed following labs and imaging studies  CBC: Recent Labs  Lab 09/30/22 0921  WBC 6.4  HGB 12.8*  HCT 38.9*  MCV 90.5  PLT 214   Basic Metabolic Panel: Recent Labs  Lab 09/30/22 0921  NA 136  K 3.8  CL 106  CO2 23  GLUCOSE 92  BUN 8  CREATININE 0.89  CALCIUM 8.5*   GFR: CrCl cannot be calculated (Unknown ideal weight.). Liver Function Tests: Recent Labs  Lab 09/30/22 0921  AST 26  ALT 10  ALKPHOS 653*  BILITOT 1.6*  PROT 7.0  ALBUMIN 3.4*   No results for input(s): "LIPASE", "AMYLASE" in the last 168 hours. No results for input(s): "AMMONIA" in the last 168 hours. Coagulation Profile: No results for input(s): "INR", "PROTIME" in the last 168 hours. Cardiac Enzymes: No results for  input(s): "CKTOTAL", "CKMB", "CKMBINDEX", "TROPONINI" in the last 168 hours. BNP (last 3 results) No results for input(s): "PROBNP" in the last 8760 hours. HbA1C: No results for input(s): "HGBA1C" in the last 72 hours. CBG: No results for input(s): "GLUCAP" in the last 168 hours. Lipid Profile: No results for input(s): "CHOL", "HDL", "LDLCALC", "TRIG", "CHOLHDL", "LDLDIRECT" in the last 72 hours. Thyroid Function Tests: No results for input(s): "TSH", "T4TOTAL", "FREET4", "T3FREE", "THYROIDAB" in the last 72 hours. Anemia Panel: No results for input(s): "VITAMINB12", "FOLATE", "FERRITIN", "TIBC", "IRON", "RETICCTPCT" in the last 72 hours. Urine analysis: No results found for: "COLORURINE", "APPEARANCEUR", "LABSPEC", "PHURINE", "GLUCOSEU", "HGBUR", "BILIRUBINUR", "KETONESUR", "PROTEINUR", "UROBILINOGEN", "NITRITE", "LEUKOCYTESUR" Sepsis Labs: @LABRCNTIP (procalcitonin:4,lacticidven:4) )No results found for this or any previous visit (from the past 240 hour(s)).   Radiological Exams on Admission: CT CHEST ABDOMEN PELVIS W  CONTRAST  Result Date: 09/30/2022 CLINICAL DATA:  Abdominal and pelvic pain and swelling. Bone metastases of unknown primary. * Tracking Code: BO * EXAM: CT CHEST, ABDOMEN, AND PELVIS WITH CONTRAST TECHNIQUE: Multidetector CT imaging of the chest, abdomen and pelvis was performed following the standard protocol during bolus administration of intravenous contrast. RADIATION DOSE REDUCTION: This exam was performed according to the departmental dose-optimization program which includes automated exposure control, adjustment of the mA and/or kV according to patient size and/or use of iterative reconstruction technique. CONTRAST:  OMNIPAQUE IOHEXOL 300 MG/ML  SOLN COMPARISON:  None Available. FINDINGS: CT CHEST FINDINGS Cardiovascular: No acute findings. Mediastinum/Lymph Nodes: No masses or pathologically enlarged lymph nodes identified. Lungs/Pleura: No suspicious pulmonary  nodules or masses identified. No evidence of infiltrate or pleural effusion. Musculoskeletal: Sclerotic bone metastases are seen throughout the thorax. CT ABDOMEN AND PELVIS FINDINGS Hepatobiliary: No masses identified. Gallstones are seen, however there is no evidence of cholecystitis or biliary dilatation. Pancreas:  No mass or inflammatory changes. Spleen:  Within normal limits in size and appearance. Adrenals/Urinary tract: No suspicious masses or hydronephrosis. Stomach/Bowel: No evidence of obstruction, inflammatory process, or abnormal fluid collections. Vascular/Lymphatic: 10 mm right external iliac lymph node identified on image 103/2. No other pathologically enlarged lymph nodes identified. No acute vascular findings. Reproductive: Normal size prostate gland. No significant abnormality identified. Other:  None. Musculoskeletal: Numerous diffuse sclerotic bone metastases are seen. IMPRESSION: Diffuse sclerotic bone metastases. 10 mm right external iliac lymph node. Metastatic disease cannot be excluded. No other sites of metastatic disease identified. No definite primary malignancy identified. Suggest correlation with PSA. Cholelithiasis. No radiographic evidence of cholecystitis. Electronically Signed   By: Danae Orleans M.D.   On: 09/30/2022 10:39      Assessment/Plan Principal Problem:   Metastasis (HCC) Active Problems:   Back pain   PE (pulmonary thromboembolism) (HCC)   Diarrhea   Depression   S/P AKA (above knee amputation) bilateral (HCC)   Obesity   Assessment and Plan:  Metastasis [facility]): Patient has metastasized disease as evidenced by MRI of lumbar spine and CT scan of chest/abdomen/pelvis.  So far no malignant origin is identified, but given sclerotic bone disease, prostate cancer is potential differential diagnosis.  Dr. Donneta Romberg of oncology is consulted.  -Placed on MedSurg bed for observation -Pending PSA -Pain control: As needed morphine, Percocet, and Lidoderm -As  needed Robaxin -INR/PTT  Back pain: Likely due to metastasized disease. -Pain control as above  PE (pulmonary thromboembolism) (HCC) -Switch Coumadin to IV heparin in case patient needs biopsy  Diarrhea: -Follow-up CAD  Depression -Wellbutrin  S/P AKA (above knee amputation) bilateral (HCC): -Fall precaution  Obesity: Body weight 129.3 kg, cannot calculate BMI due to bilateral AKA -Encourage losing weight -Exercise and healthy diet      DVT ppx: SQ Heparin        Code Status: Full code  Family Communication: I offered to call his family, but patient states that he does not have any family member to call  Disposition Plan:  Anticipate discharge back to previous environment  Consults called:   Dr. Donneta Romberg of oncology is consulted.  Admission status and Level of care: Med-Surg:    for obs   Dispo: The patient is from: Home              Anticipated d/c is to: Home              Anticipated d/c date is: 1 day  Patient currently is not medically stable to d/c.    Severity of Illness:  The appropriate patient status for this patient is OBSERVATION. Observation status is judged to be reasonable and necessary in order to provide the required intensity of service to ensure the patient's safety. The patient's presenting symptoms, physical exam findings, and initial radiographic and laboratory data in the context of their medical condition is felt to place them at decreased risk for further clinical deterioration. Furthermore, it is anticipated that the patient will be medically stable for discharge from the hospital within 2 midnights of admission.        Date of Service 09/30/2022    Lorretta Harp [facility]   If 7PM-7AM, please contact night-coverage [email or web address] 09/30/2022, 12:45 PM

## 2022-09-30 NOTE — ED Notes (Signed)
Patient transported to CT 

## 2022-09-30 NOTE — ED Triage Notes (Signed)
Pt states coming in due to lesions on his lumbar, that was verified on MRI as well as left "stump" swelling. Pt states his groin hurts and has swelling too. Pt states he was supposed to come in this past Tuesday, but could not make it.

## 2022-09-30 NOTE — Consult Note (Signed)
ANTICOAGULATION CONSULT NOTE - Initial Consult  Pharmacy Consult for heparin infusion Indication:  Hx of PE - warfarin PTA  Allergies  Allergen Reactions   Bacitracin Hives   Vancomycin Other (See Comments) and Rash    Red man syndrome.   Ketorolac     Other Reaction(s): Kidney Disorder  Other reaction(s): Kidney Disorder   Spironolactone     Other Reaction(s): Unknown  Other reaction(s): Unknown    Patient Measurements: Weight: 129.3 kg (285 lb) Heparin Dosing Weight: 82.54 (used 50 kg as IBW as patient under 60 inches)  Vital Signs: Temp: 98 F (36.7 C) (07/28 1225) BP: 136/96 (07/28 1225) Pulse Rate: 72 (07/28 1225)  Labs: Recent Labs    09/30/22 0921  HGB 12.8*  HCT 38.9*  PLT 214  CREATININE 0.89    CrCl cannot be calculated (Unknown ideal weight.).   Medical History: Past Medical History:  Diagnosis Date   Depression    History of left knee replacement    HTN (hypertension)    PE (pulmonary thromboembolism) (HCC)    S/P AKA (above knee amputation) bilateral (HCC)     Medications:  PTA: warfarin regimen: 5 mg daily (TWD = 35 mg) Last dose 7/27  Assessment: 67 y.o. male with medical history significant of s/p of bilateral AKA, PE on Coumadin regimen outlined above presents with back pain x 6 weeks. Outpatient MRI of lumbar spine on 7/11, showed innumerable metastasized bone lesions. Patient admitted for further workup/biopsy. Pharmacy has been consulted to initiate and manage IV heparin therapy while coumadin held.  Goal of Therapy:  Heparin level 0.3-0.7 units/ml Monitor platelets by anticoagulation protocol: Yes   Plan:  INR 2.8 due to recent warfarin dose Withhold heparin infusion until INR < 2.0 Will repeat INR with AM labs  Sharen Hones, PharmD, BCPS Clinical Pharmacist   09/30/2022,12:44 PM

## 2022-10-01 ENCOUNTER — Telehealth: Payer: Self-pay | Admitting: Internal Medicine

## 2022-10-01 ENCOUNTER — Other Ambulatory Visit: Payer: Self-pay | Admitting: Internal Medicine

## 2022-10-01 DIAGNOSIS — C61 Malignant neoplasm of prostate: Secondary | ICD-10-CM

## 2022-10-01 DIAGNOSIS — Z89612 Acquired absence of left leg above knee: Secondary | ICD-10-CM

## 2022-10-01 DIAGNOSIS — C7951 Secondary malignant neoplasm of bone: Secondary | ICD-10-CM | POA: Diagnosis not present

## 2022-10-01 DIAGNOSIS — Z89611 Acquired absence of right leg above knee: Secondary | ICD-10-CM | POA: Diagnosis not present

## 2022-10-01 DIAGNOSIS — G893 Neoplasm related pain (acute) (chronic): Secondary | ICD-10-CM | POA: Diagnosis not present

## 2022-10-01 MED ORDER — WARFARIN SODIUM 5 MG PO TABS: 5.0000 mg | ORAL_TABLET | Freq: Every day | ORAL | Status: DC

## 2022-10-01 MED ORDER — SENNOSIDES-DOCUSATE SODIUM 8.6-50 MG PO TABS: 2.0000 | ORAL_TABLET | Freq: Two times a day (BID) | ORAL | 0 refills | Status: DC | PRN

## 2022-10-01 MED ORDER — WARFARIN - PHARMACIST DOSING INPATIENT: Freq: Every day | Status: DC

## 2022-10-01 MED ORDER — OXYCODONE-ACETAMINOPHEN 5-325 MG PO TABS: 1.0000 | ORAL_TABLET | ORAL | 0 refills | Status: DC | PRN

## 2022-10-01 MED ORDER — POTASSIUM CHLORIDE CRYS ER 20 MEQ PO TBCR
40.0000 meq | EXTENDED_RELEASE_TABLET | Freq: Once | ORAL | Status: AC
  Administered 2022-10-01: 40 meq via ORAL
  Filled 2022-10-01: qty 2

## 2022-10-01 NOTE — Discharge Summary (Signed)
Physician Discharge Summary   Patient: [name] MRN: [id number] DOB: 1956-01-15  Admit date:     09/30/2022  Discharge date: 10/01/22  Discharge Physician: Marrion Coy   PCP: Dione Housekeeper, MD   Recommendations at discharge:   Follow-up with PCP in 1 week. Follow-up with oncology within 1 week.  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 Resolved Problems:   * No resolved hospital problems. *  Hospital Course:  [name] is a 67 y.o. male with medical history significant of s/p of bilateral AKA, PE on Coumadin, HTN, depression, who presents with back pain.  CT scan of the chest/abdomen/pelvis showed pulmonary metastasis.  MRI of the lumbar spine showed innumerable bone lesions throughout the Spine.  Back pain definitely from metastatic cancer.  Patient was seen by oncology, given extremely high PSA, assume this is from prostate cancer metastasis.  Oncology does not recommend biopsy before chemotherapy.  At this point, other than some back pain, patient does not have any complaint.  He already has set up equipment required for mobility at home previously, no need for nursing placement. Patient also has history of PE, on warfarin, INR 2.9, discussed with pharmacy, patient may resume warfarin tomorrow.        Consultants: Oncology Procedures performed: None  Disposition: Home Diet recommendation:  Discharge Diet Orders (From admission, onward)     Start     Ordered   10/01/22 0000  Diet - low sodium heart healthy        10/01/22 0930           Cardiac diet DISCHARGE MEDICATION: Allergies as of 10/01/2022       Reactions   Bacitracin Hives    Vancomycin Other (See Comments), Rash   Red man syndrome.   Ketorolac    Other Reaction(s): Kidney Disorder Other reaction(s): Kidney Disorder   Spironolactone    Other Reaction(s): Unknown Other reaction(s): Unknown        Medication List     TAKE these medications    buPROPion 300 MG 24 hr tablet Commonly known as: WELLBUTRIN XL Take 300 mg by mouth daily.   lisinopril 20 MG tablet Commonly known as: ZESTRIL Take 20 mg by mouth 2 (two) times daily.   oxyCODONE-acetaminophen 5-325 MG tablet Commonly known as: PERCOCET/ROXICET Take 1 tablet by mouth every 4 (four) hours as needed for moderate pain.   senna-docusate 8.6-50 MG tablet Commonly known as: Senokot-S Take 2 tablets by mouth 2 (two) times daily as needed for mild constipation.   warfarin 5 MG tablet Commonly known as: COUMADIN Take 1 tablet (5 mg total) by mouth daily. Start taking on: October 02, 2022        Follow-up Information     Zada Finders Joycie Peek, MD Follow up in 1 week(s).   Specialty: Family Medicine Contact information: [address] [place] [address] [phone]         Earna Coder, MD Follow up in 1 week(s).   Specialties: Internal Medicine, Oncology Contact information: [address] [place] [address] [phone]                Discharge  Exam: Filed Weights   09/30/22 0852 09/30/22 1225  Weight: 129.3 kg 131.1 kg   General exam: Appears calm and comfortable, obese. Respiratory system: Clear to auscultation. Respiratory effort normal. Cardiovascular system: S1 & S2 heard, RRR. No JVD, murmurs, rubs, gallops or clicks.  Gastrointestinal system: Abdomen is nondistended, soft and nontender. No organomegaly or masses felt. Normal bowel sounds heard. Central nervous system: Alert and oriented. No focal neurological deficits. Extremities: Bilateral AKA. Skin: No rashes, lesions or ulcers Psychiatry: Judgement and insight appear normal. Mood &  affect appropriate.    Condition at discharge: fair  The results of significant diagnostics from this hospitalization (including imaging, microbiology, ancillary and laboratory) are listed below for reference.   Imaging Studies: CT CHEST ABDOMEN PELVIS W CONTRAST  Result Date: 09/30/2022 CLINICAL DATA:  Abdominal and pelvic pain and swelling. Bone metastases of unknown primary. * Tracking Code: BO * EXAM: CT CHEST, ABDOMEN, AND PELVIS WITH CONTRAST TECHNIQUE: Multidetector CT imaging of the chest, abdomen and pelvis was performed following the standard protocol during bolus administration of intravenous contrast. RADIATION DOSE REDUCTION: This exam was performed according to the departmental dose-optimization program which includes automated exposure control, adjustment of the mA and/or kV according to patient size and/or use of iterative reconstruction technique. CONTRAST:  OMNIPAQUE IOHEXOL 300 MG/ML  SOLN COMPARISON:  None Available. FINDINGS: CT CHEST FINDINGS Cardiovascular: No acute findings. Mediastinum/Lymph Nodes: No masses or pathologically enlarged lymph nodes identified. Lungs/Pleura: No suspicious pulmonary nodules or masses identified. No evidence of infiltrate or pleural effusion. Musculoskeletal: Sclerotic bone metastases are seen throughout the thorax. CT ABDOMEN AND PELVIS FINDINGS Hepatobiliary: No masses identified. Gallstones are seen, however there is no evidence of cholecystitis or biliary dilatation. Pancreas:  No mass or inflammatory changes. Spleen:  Within normal limits in size and appearance. Adrenals/Urinary tract: No suspicious masses or hydronephrosis. Stomach/Bowel: No evidence of obstruction, inflammatory process, or abnormal fluid collections. Vascular/Lymphatic: 10 mm right external iliac lymph node identified on image 103/2. No other pathologically enlarged lymph nodes identified. No acute vascular findings. Reproductive: Normal size prostate gland. No significant  abnormality identified. Other:  None. Musculoskeletal: Numerous diffuse sclerotic bone metastases are seen. IMPRESSION: Diffuse sclerotic bone metastases. 10 mm right external iliac lymph node. Metastatic disease cannot be excluded. No other sites of metastatic disease identified. No definite primary malignancy identified. Suggest correlation with PSA. Cholelithiasis. No radiographic evidence of cholecystitis. Electronically Signed   By: Danae Orleans M.D.   On: 09/30/2022 10:39   MR LUMBAR SPINE WO CONTRAST  Result Date: 09/20/2022 CLINICAL DATA:  Low back pain for 6 months extending into the buttocks. EXAM: MRI LUMBAR SPINE WITHOUT CONTRAST TECHNIQUE: Multiplanar, multisequence MR imaging of the lumbar spine was performed. No intravenous contrast was administered. COMPARISON:  None Available. FINDINGS: Segmentation:  5 lumbar type vertebrae. Alignment:  Physiologic Vertebrae: Multiple bone lesions seen affecting every covered level (T12-S2) with numerous deposits at most levels.Both ilia are involved. The largest is a confluent mass centered at the S1 level with ventral epidural extension contiguous with the posterior and inferior corner of L5 and extending inferiorly into the S2 level. This lesion causes advanced thecal sac stenosis at the level of L5-S1 and S1. Conus medullaris and cauda equina: Conus extends to the L1 level. Conus and cauda equina appear normal. Paraspinal and other soft tissues: Left hydronephrosis versus large cysts. No noted retroperitoneal adenopathy. Disc levels: Generalized disc bulging and facet spurring with endplate spurs. No degenerative impingement. These results will be called  to the ordering clinician or representative by the Radiologist Assistant, and communication documented in the PACS or [employer]. IMPRESSION: Metastatic pattern with innumerable bone lesions throughout the covered spine, most notably at the L5-S1 level where there is advanced thecal sac effacement due  to bulky ventral epidural tumor. Electronically Signed   By: Tiburcio Pea M.D.   On: 09/20/2022 17:28    Microbiology: No results found for this or any previous visit.  Labs: CBC: Recent Labs  Lab 09/30/22 0921 10/01/22 0501  WBC 6.4 7.2  HGB 12.8* 12.1*  HCT 38.9* 36.9*  MCV 90.5 90.2  PLT 214 212   Basic Metabolic Panel: Recent Labs  Lab 09/30/22 0921 10/01/22 0501  NA 136 136  K 3.8 3.5  CL 106 106  CO2 23 22  GLUCOSE 92 108*  BUN 8 8  CREATININE 0.89 0.87  CALCIUM 8.5* 8.6*   Liver Function Tests: Recent Labs  Lab 09/30/22 0921  AST 26  ALT 10  ALKPHOS 653*  BILITOT 1.6*  PROT 7.0  ALBUMIN 3.4*   CBG: No results for input(s): "GLUCAP" in the last 168 hours.  Discharge time spent: greater than 30 minutes.  Signed: Marrion Coy, MD [facility] 10/01/2022

## 2022-10-01 NOTE — Evaluation (Signed)
Occupational Therapy Evaluation Patient Details Name: [name] MRN: [id number] DOB: 1955/10/12 Today's Date: 10/01/2022   History of Present Illness 68M, hx of s/p of bilateral AKA, PE on Coumadin, HTN, depression, presents to [facility] on 09/30/22 with back pain for 6 weeks. Outpt MRI-L spine showed innumerable metastasized bone lesions. CT-chest/abd/pelvis showed diffuse sclerotic bone metastasized disease, no origin site of malignancy identified.   Clinical Impression   Pt was seen for OT evaluation this date. Prior to hospital admission, pt was modified independent from w/c level at home with his dog [name]. Pt lives in a wheelchair accessible home and has groceries delivered. He rarely leaves his home due to no vehicle. He notes he has a lot of gym equipment in his garage that he has not been able to use in order to maintain his strength due to pain. Pt is currently modified independent with ADL and mobility at w/c level, however, limited by pain resulting in increased effort/time to complete tasks. Pt would benefit from additional skilled OT services upon discharge to address noted impairments and functional limitations (see below for any additional details) in order to maximize safety and independence while minimizing falls risk and caregiver burden.    Recommendations for follow up therapy are one component of a multi-disciplinary discharge planning process, led by the attending physician.  Recommendations may be updated based on patient status, additional functional criteria and insurance authorization.   Assistance Recommended at Discharge PRN  Patient can return home with the following A little help with bathing/dressing/bathroom;Assist for transportation    Functional Status Assessment  Patient has had a recent decline in their functional status and demonstrates the ability to make significant improvements in function in a reasonable and predictable amount of time.  Equipment  Recommendations  None recommended by OT    Recommendations for Other Services       Precautions / Restrictions Precautions Precautions: Fall Restrictions Weight Bearing Restrictions: No Other Position/Activity Restrictions: bilat AKA      Mobility Bed Mobility Overal bed mobility: Modified Independent             General bed mobility comments: increased time/effort, pain limited    Transfers Overall transfer level: Modified independent                 General transfer comment: increased time/effort, pain limited      Balance Overall balance assessment: Modified Independent                                         ADL either performed or assessed with clinical judgement   ADL Overall ADL's : Modified independent                                       General ADL Comments: increased time/effort, pain limited     Vision         Perception     Praxis      Pertinent Vitals/Pain       Hand Dominance     Extremity/Trunk Assessment Upper Extremity Assessment Upper Extremity Assessment: Overall WFL for tasks assessed   Lower Extremity Assessment Lower Extremity Assessment:  (hx AKA)       Communication Communication Communication: No difficulties   Cognition Arousal/Alertness: Awake/alert Behavior During Therapy: WFL for tasks assessed/performed Overall  Cognitive Status: Within Functional Limits for tasks assessed                                       General Comments       Exercises Other Exercises Other Exercises: Facilitated problem solving and education in activity pacing   Shoulder Instructions      Home Living Family/patient expects to be discharged to:: Private residence Living Arrangements: Alone Available Help at Discharge: Friend(s);Available PRN/intermittently Type of Home: House Home Access: Ramped entrance;Other (comment) Entrance Stairs-Number of Steps: platform lift  in garage into home   Home Layout: Two level;Able to live on main level with bedroom/bathroom Alternate Level Stairs-Number of Steps: has stair lift chair   Bathroom Shower/Tub: Walk-in shower   Bathroom Toilet: Handicapped height     Home Equipment: Shower seat;Grab bars - toilet;Grab bars - tub/shower;Wheelchair - Engineer, technical sales - power   Additional Comments: home is wheelchair accessible throughout      Prior Functioning/Environment Prior Level of Function : Independent/Modified Independent             Mobility Comments: w/c level, mod indep ADLs Comments: mod indep        OT Problem List: Pain;Decreased activity tolerance      OT Treatment/Interventions:      OT Goals(Current goals can be found in the care plan section) Acute Rehab OT Goals Patient Stated Goal: continue to be self sufficient, less pain OT Goal Formulation: All assessment and education complete, DC therapy  OT Frequency:      Co-evaluation              AM-PAC OT "6 Clicks" Daily Activity     Outcome Measure Help from another person eating meals?: None Help from another person taking care of personal grooming?: None Help from another person toileting, which includes using toliet, bedpan, or urinal?: None Help from another person bathing (including washing, rinsing, drying)?: None Help from another person to put on and taking off regular upper body clothing?: None Help from another person to put on and taking off regular lower body clothing?: None 6 Click Score: 24   End of Session Nurse Communication: Other (comment) (d/c recs)  Activity Tolerance: Patient tolerated treatment well Patient left: in bed;with call bell/phone within reach;with nursing/sitter in room  OT Visit Diagnosis: Other abnormalities of gait and mobility (R26.89);Pain Pain - part of body:  (back)                Time: 0254-2706 OT Time Calculation (min): 40 min Charges:  OT General Charges $OT Visit: 1  Visit OT Evaluation $OT Eval Low Complexity: 1 Low  Arman Filter., MPH, MS, OTR/L ascom [phone] 10/01/22, 9:54 AM

## 2022-10-01 NOTE — Telephone Encounter (Signed)
Please schedule this pt to see me on 08/01- MD: no labs; Firmagon  Please coordinate with rad-appt- pt has transportation issues.   Thanks GB

## 2022-10-01 NOTE — Plan of Care (Signed)

## 2022-10-01 NOTE — Consult Note (Signed)
ANTICOAGULATION CONSULT NOTE  Pharmacy Consult for Heparin Infusion Indication:  history of PE, on warfarin PTA  Allergies  Allergen Reactions   Bacitracin Hives   Vancomycin Other (See Comments) and Rash    Red man syndrome.   Ketorolac     Other Reaction(s): Kidney Disorder  Other reaction(s): Kidney Disorder   Spironolactone     Other Reaction(s): Unknown  Other reaction(s): Unknown    Patient Measurements: Height: 4\' 10"  (147.3 cm) Weight: 131.1 kg (289 lb) IBW/kg (Calculated) : 45.4 Heparin Dosing Weight: 82.5 kg (IBW of 50 kg as patient under 60 in)  Vital Signs: Temp: 98.2 F (36.8 C) (07/29 0443) Temp Source: Oral (07/29 0443) BP: 119/73 (07/29 0443) Pulse Rate: 72 (07/29 0443)  Labs: Recent Labs    09/30/22 0921 09/30/22 1237 10/01/22 0501  HGB 12.8*  --  12.1*  HCT 38.9*  --  36.9*  PLT 214  --  212  APTT  --  46*  --   LABPROT  --  29.8* 30.5*  INR  --  2.8* 2.9*  CREATININE 0.89  --  0.87    Estimated Creatinine Clearance: 94.2 mL/min (by C-G formula based on SCr of 0.87 mg/dL).   Medical History: Past Medical History:  Diagnosis Date   Depression    History of left knee replacement    HTN (hypertension)    PE (pulmonary thromboembolism) (HCC)    S/P AKA (above knee amputation) bilateral (HCC)     Medications:  PTA Warfarin: 35 mg/wk 5 mg daily   Assessment: [name] is a 67 y.o. male presenting with back pain x6 weeks. PMH significant for bilateral AKA, obesity, h/o PE, metastases. Outpatient MRI of lumbar spine on 7/11, showed innumerable metastasized bone lesions. Patient admitted for further workup/biopsy. Patient was on warfarin PTA per chart review. Last dose of Warfarin PTA was on 7/27 (5 mg). Of note, INR was 2.8 on home regimen on arrival. Warfarin now on hold for workup. Pharmacy has been consulted to initiate and manage heparin infusion.   Baseline Labs: aPTT 46, PT 29.8, INR 2.8, Hgb 12.8, Hct 38.9, Plt 214   Goal of  Therapy:  Heparin level 0.3-0.7 units/ml Monitor platelets by anticoagulation protocol: Yes   Plan:  INR remains therapeutic at 2.9  Continue to hold heparin infusion until INR < 2.0 Repeat INR with AM labs  Celene Squibb, PharmD Clinical Pharmacist 10/01/2022 7:38 AM

## 2022-10-01 NOTE — Progress Notes (Signed)
Patient sitting in hallway with his belongings. Ride to go home is here; asked patient to wait until providers see him prior to leaving. Patient voices understanding. Otherwise patient is ready to discharge.

## 2022-10-01 NOTE — TOC Transition Note (Signed)
Transition of Care [facility]) - CM/SW Discharge Note   Patient Details  Name: [name] MRN: [id number] Date of Birth: 1955-06-09  [facility] of [facility]) CM/SW Contact:  Allena Katz, LCSW Phone Number: 10/01/2022, 11:20 AM   Clinical Narrative:   Pt actively being discharged. Pt not interested in [facility] but is interested in aide services. Pt reports his house is fully able to accommodate his AKA and mobility needs he needs assistance with transportation. He has reached out to [employer] and other transport agencies and report they haven't been able to accommodate. Referral made to [facility] for aide resources. Pt unde stands [facility] is non [facility] affiliated and has reached out to them in the past. Secure chat sent to [facility] oncology team to see if they could assist with coordinating transport. Team states [facility] will pay for transport. Message relayed to pt. Pt reports [facility] Gra[facility] has told them differently but he will follow up with them again. Pt has transport to his first appointment through his friend on Tuesday.           Patient Goals and CMS Choice      Discharge Placement                         Discharge Plan and Services Additional resources added to the After Visit Summary for                                       Social Determinants of Health (SDOH) Interventions SDOH Screenings   Food Insecurity: No Food Insecurity (09/30/2022)  Housing: Low Risk  (09/30/2022)  Transportation Needs: No Transportation Needs (09/30/2022)  Utilities: Not At Risk (09/30/2022)  Financial Resource Strain: High Risk (07/28/2021)   Received from [facility], [facility]  Physical Activity: Sufficiently Active (07/28/2021)   Received from [facility], [facility]  Social Connections: Socially Isolated (08/01/2021)   Received from [facility], [facility]  Stress: Stress Concern  Present (08/01/2021)   Received from [facility], [facility]  Tobacco Use: Low Risk  (09/30/2022)  Recent Concern: Tobacco Use - Medium Risk (09/25/2022)   Received from [facility]     Readmission Risk Interventions     No data to display

## 2022-10-02 ENCOUNTER — Telehealth: Payer: Self-pay

## 2022-10-02 NOTE — Telephone Encounter (Signed)
FYI - Patient called asking about what was going to happen at his appointments on 8/1. He just wanted to make sure that his appointments needed to be in person rather than virtual, as he is a bilateral above the knee amputee and has to arrange a ride and help coming to appointments. I advised him that Dr Donneta Romberg and Dr Rushie Chestnut would want to see him in person probably but most importantly he is getting a chemo shot as well that day. I informed him of the free valet service and that we would help him around if needed. Patient verbalized understanding.

## 2022-10-04 ENCOUNTER — Encounter: Payer: Self-pay | Admitting: Internal Medicine

## 2022-10-04 ENCOUNTER — Inpatient Hospital Stay: Payer: Medicare PPO

## 2022-10-04 ENCOUNTER — Inpatient Hospital Stay: Payer: Medicare PPO | Admitting: Internal Medicine

## 2022-10-04 ENCOUNTER — Ambulatory Visit
Admission: RE | Admit: 2022-10-04 | Discharge: 2022-10-04 | Disposition: A | Discharge status: Home / Self Care | Payer: Medicare PPO | Source: Ambulatory Visit | Attending: Radiation Oncology | Admitting: Radiation Oncology

## 2022-10-04 VITALS — BP 127/69 | HR 75 | Temp 99.0°F | Ht <= 58 in

## 2022-10-04 DIAGNOSIS — R5383 Other fatigue: Secondary | ICD-10-CM | POA: Insufficient documentation

## 2022-10-04 DIAGNOSIS — C7951 Secondary malignant neoplasm of bone: Secondary | ICD-10-CM | POA: Insufficient documentation

## 2022-10-04 DIAGNOSIS — Z89611 Acquired absence of right leg above knee: Secondary | ICD-10-CM | POA: Insufficient documentation

## 2022-10-04 DIAGNOSIS — I2510 Atherosclerotic heart disease of native coronary artery without angina pectoris: Secondary | ICD-10-CM | POA: Insufficient documentation

## 2022-10-04 DIAGNOSIS — C61 Malignant neoplasm of prostate: Secondary | ICD-10-CM | POA: Insufficient documentation

## 2022-10-04 DIAGNOSIS — Z7901 Long term (current) use of anticoagulants: Secondary | ICD-10-CM | POA: Insufficient documentation

## 2022-10-04 DIAGNOSIS — Z89612 Acquired absence of left leg above knee: Secondary | ICD-10-CM | POA: Insufficient documentation

## 2022-10-04 DIAGNOSIS — Z510 Encounter for antineoplastic radiation therapy: Secondary | ICD-10-CM | POA: Diagnosis present

## 2022-10-04 MED ORDER — DEXAMETHASONE 4 MG PO TABS: ORAL_TABLET | ORAL | 0 refills | Status: DC

## 2022-10-04 MED ORDER — DEGARELIX ACETATE(240 MG DOSE) 120 MG/VIAL ~~LOC~~ SOLR
240.0000 mg | Freq: Once | SUBCUTANEOUS | Status: AC
  Administered 2022-10-04: 240 mg via SUBCUTANEOUS
  Filled 2022-10-04: qty 6

## 2022-10-04 NOTE — Progress Notes (Signed)
[name] OFFICE PROGRESS NOTE  Patient Care Team: [name] Finders Joycie Peek, MD as PCP - General (Family Medicine) [name] Coder, MD as Consulting Physician (Oncology) [name] Miller, MD as Consulting Physician (Radiation Oncology)   Cancer Staging  Prostate cancer metastatic to bone [name]) Staging form: Prostate, AJCC 8th Edition - Clinical: Stage IVB (cTX, cNX, pM1b, PSA: 703) - Signed by [name] Coder, MD on 10/04/2022 Prostate specific antigen (PSA) range: 20 or greater    Oncology History Overview Note  #HIGH RISK/ DE-NOVO CASTRATE SENSITIVE PROSTATE CANCER: CT CAP [JULY 2024]-multiple bone metastases; MRI lumbar spine without contrast  [PCP]-L4-L5 lesion.  On the thecal sac.  PSA 700.   Check guardant testing. NO Biopsy.  # AUG 1st, 2024- Firmagon   # CAD; on coumadin; Bil AKA    Prostate cancer metastatic to bone (HCC)  09/30/2022 Initial Diagnosis   Prostate cancer metastatic to bone (HCC)      HISTORY OF PRESENT ILLNESS: Patient ambulating-with wheel chair/AKA. Accompanied by friend.   [name] 67 y.o.  male pleasant patient with a medical history significant of s/p of bilateral AKA, PE on Coumadin, HTN, depression,is here to discuss his treatment options for prostate cancer.   Patient recently presented to the emergency room with worsening back pain.  CT scan of the chest/abdomen/pelvis showed innumerable bone mets.  MRI of the lumbar spine showed innumerable bone lesions throughout the Spine-also.   Patient evaluated in the hospital with above findings.    Today's accompanied by his friend.  Denies any bladder or bowel incontinence.  Continues to have shooting pain in his legs stumps bilaterally.  Review of Systems  Constitutional:  Positive for malaise/fatigue. Negative for chills, diaphoresis, fever and weight loss.  HENT:  Negative for nosebleeds and sore throat.   Eyes:  Negative for double vision.  Respiratory:  Negative for  cough, hemoptysis, sputum production, shortness of breath and wheezing.   Cardiovascular:  Positive for leg swelling. Negative for chest pain, palpitations and orthopnea.  Gastrointestinal:  Negative for abdominal pain, blood in stool, constipation, diarrhea, heartburn, melena, nausea and vomiting.  Genitourinary:  Negative for dysuria, frequency and urgency.  Musculoskeletal:  Positive for back pain, joint pain and myalgias.  Skin: Negative.  Negative for itching and rash.  Neurological:  Negative for dizziness, tingling, focal weakness, weakness and headaches.  Endo/Heme/Allergies:  Does not bruise/bleed easily.  Psychiatric/Behavioral:  Negative for depression. The patient is not nervous/anxious and does not have insomnia.       PAST MEDICAL HISTORY :  Past Medical History:  Diagnosis Date   Depression    History of blood clots 08/13/2011   History of left knee replacement    HTN (hypertension)    Lymphedema    lt leg-per pt   PE (pulmonary thromboembolism) (HCC)    S/P AKA (above knee amputation) bilateral (HCC)    rt 11/24/07 and lt 01/11/17    PAST SURGICAL HISTORY :   Past Surgical History:  Procedure Laterality Date   ACHILLES TENDON SURGERY Left    per pt   bone clip removal Left 10/04/2015   knee   CARDIAC SURGERY  11/2012   cath.-per pt   CERVICAL FUSION  07/28/2015   per pt   EPIGASTRIC HERNIA REPAIR     per pt   hematoma removal Left 11/02/2015   knee   HIP SURGERY Right    x8 per pt   KNEE FUSION Right    KNEE JOINT MANIPULATION  Left 09/11/2011   KNEE SURGERY Left    x3   leg stump Left 03/2017   leg surgery-per pt   NECK SURGERY  02/2015   plates-per pt   REPLACEMENT TOTAL KNEE Right    REPLACEMENT TOTAL KNEE Left 07/02/2011   again in 07/10/2011 per pt   s/p of bilateral AKA Right    screen filter removal  10/20/2008   for embolism per pt   screen filter replacement     x3   SHOULDER SURGERY Right    x2-per pt   SPINE SURGERY  02/11/2015    vertebra spacer-per pt   toe nail removal Right    per pt   VARICOSE VEIN SURGERY     x5-per pt    FAMILY HISTORY :   Family History  Problem Relation Age of Onset   Alzheimer's disease Mother    Lung cancer Father     SOCIAL HISTORY:   Social History   Tobacco Use   Smoking status: Never   Smokeless tobacco: Never  Vaping Use   Vaping status: Never Used  Substance Use Topics   Alcohol use: No   Drug use: Never    ALLERGIES:  is allergic to bacitracin, vancomycin, ketorolac, and spironolactone.  MEDICATIONS:  Current Outpatient Medications  Medication Sig Dispense Refill   buPROPion (WELLBUTRIN XL) 300 MG 24 hr tablet Take 300 mg by mouth daily.     dexamethasone (DECADRON) 4 MG tablet Once a day- in AM with breakfast. 30 tablet 0   lisinopril (ZESTRIL) 20 MG tablet Take 20 mg by mouth 2 (two) times daily.     warfarin (COUMADIN) 5 MG tablet Take 1 tablet (5 mg total) by mouth daily.     oxyCODONE-acetaminophen (PERCOCET/ROXICET) 5-325 MG tablet Take 1 tablet by mouth every 4 (four) hours as needed for moderate pain. (Patient not taking: Reported on 10/04/2022) 16 tablet 0   senna-docusate (SENOKOT-S) 8.6-50 MG tablet Take 2 tablets by mouth 2 (two) times daily as needed for mild constipation. (Patient not taking: Reported on 10/04/2022) 100 tablet 0   No current facility-administered medications for this visit.    PHYSICAL EXAMINATION:   BP 127/69 (BP Location: Left Arm, Patient Position: Sitting, Cuff Size: Large)   Pulse 75   Temp 99 F (37.2 C) (Tympanic)   Ht 4\' 10"  (1.473 m)   SpO2 95%   BMI 60.40 kg/m   Filed Weights   Patient has bilateral AKA.   Physical Exam Vitals and nursing note reviewed.  HENT:     Head: Normocephalic and atraumatic.     Mouth/Throat:     Pharynx: Oropharynx is clear.  Eyes:     Extraocular Movements: Extraocular movements intact.     Pupils: Pupils are equal, round, and reactive to light.  Cardiovascular:     Rate and  Rhythm: Normal rate and regular rhythm.  Pulmonary:     Comments: Decreased breath sounds bilaterally.  Abdominal:     Palpations: Abdomen is soft.  Musculoskeletal:        General: Normal range of motion.     Cervical back: Normal range of motion.  Skin:    General: Skin is warm.  Neurological:     General: No focal deficit present.     Mental Status: He is alert and oriented to person, place, and time.  Psychiatric:        Behavior: Behavior normal.        Judgment: Judgment normal.  LABORATORY DATA:  I have reviewed the data as listed    Component Value Date/Time   NA 136 10/01/2022 0501   K 3.5 10/01/2022 0501   CL 106 10/01/2022 0501   CO2 22 10/01/2022 0501   GLUCOSE 108 (H) 10/01/2022 0501   BUN 8 10/01/2022 0501   CREATININE 0.87 10/01/2022 0501   CALCIUM 8.6 (L) 10/01/2022 0501   PROT 7.0 09/30/2022 0921   ALBUMIN 3.4 (L) 09/30/2022 0921   AST 26 09/30/2022 0921   ALT 10 09/30/2022 0921   ALKPHOS 653 (H) 09/30/2022 0921   BILITOT 1.6 (H) 09/30/2022 0921   GFRNONAA >60 10/01/2022 0501   GFRAA >60 10/19/2016 1335    No results found for: "SPEP", "UPEP"  Lab Results  Component Value Date   WBC 7.2 10/01/2022   NEUTROABS 3.3 10/19/2016   HGB 12.1 (L) 10/01/2022   HCT 36.9 (L) 10/01/2022   MCV 90.2 10/01/2022   PLT 212 10/01/2022      Chemistry      Component Value Date/Time   NA 136 10/01/2022 0501   K 3.5 10/01/2022 0501   CL 106 10/01/2022 0501   CO2 22 10/01/2022 0501   BUN 8 10/01/2022 0501   CREATININE 0.87 10/01/2022 0501      Component Value Date/Time   CALCIUM 8.6 (L) 10/01/2022 0501   ALKPHOS 653 (H) 09/30/2022 0921   AST 26 09/30/2022 0921   ALT 10 09/30/2022 0921   BILITOT 1.6 (H) 09/30/2022 0921       RADIOGRAPHIC STUDIES: I have personally reviewed the radiological images as listed and agreed with the findings in the report. No results found.   ASSESSMENT & PLAN:  Prostate cancer metastatic to bone (HCC) #HIGH RISK/  DE-NOVO CASTRATE SENSITIVE PROSTATE CANCER: CT CAP [JULY 2024]-multiple bone metastases; MRI lumbar spine without contrast  [PCP]-L4-L5 lesion.  On the thecal sac.  PSA 700.  Decided to hold off any biopsy given the absence of any nodal metastases noted on imaging.  And also given above clinical findings.  Check guardant testing. Check PSMA PET scan.   # I reviewed the natural history/stage of patient's prostate cancer in detail.  Discussed that unfortunately patient's cancer cannot be cured.  All treatments are palliative and not curative. Discussed that the median life expectancy in the order of 3 to 5 years; also understand these statistics do not belong to the patient.   # I reviewed the recent data from ARASENS/PEACE-1 clinical trial in which patients with high risk/de novo castrate sensitive metastatic prostate cancer-treated with triple therapy [ADT + Taxotere+ Darolutamide or Abiraterone]-showed PFS/overall survival benefit compared to doublet ADT plus Taxotere.  However I am very concerned about patient's tolerance to chemotherapy given his bilateral AKA/limited mobility/phantom pain etc.  # Discussed the mechanism of action of Firmagon- testosterone deprivation; understands treatments are palliative not curative.  Discussed the potential side effects including but not limited to hot flashes fatigue.   # Back pain pain medication: Patient currently not taking oxycodone.  Has a prescription.  Recommend dexamethasone 4 mg prescription.  Sent.  Also evaluated with Dr. Aggie Cosier.   # CAD; bil AKA- on coumadin.   # DISPOSITION: # no labs today # firmagon today # follow up in 4 weeks- MD; labs- cbc/cmp;PSA; Guardant testing;  PSMA scan ASAP - dr.B  # 40 minutes face-to-face with the patient discussing the above plan of care; more than 50% of time spent on prognosis/ natural history; counseling and coordination.   Orders  Placed This Encounter  Procedures   NM PET (PSMA) SKULL TO MID THIGH     Standing Status:   Future    Standing Expiration Date:   10/04/2023    Order Specific Question:   If indicated for the ordered procedure, I authorize the administration of a radiopharmaceutical per Radiology protocol    Answer:   Yes    Order Specific Question:   Preferred imaging location?    Answer:   [facility]   CBC with Differential (Cancer [name] Only)    Standing Status:   Future    Standing Expiration Date:   10/04/2023   CMP (Cancer [name] only)    Standing Status:   Future    Standing Expiration Date:   10/04/2023   PSA    Standing Status:   Future    Standing Expiration Date:   10/04/2023   All questions were answered. The patient knows to call the clinic with any problems, questions or concerns.      [name] Coder, MD 10/04/2022 12:18 PM

## 2022-10-04 NOTE — Consult Note (Signed)
NEW PATIENT EVALUATION  Name: [name]  MRN: [id number]  Date:   10/04/2022     DOB: 1955/12/14   This 67 y.o. 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  REFERRING PHYSICIAN: Dione Housekeeper, *  CHIEF COMPLAINT: No chief complaint on file.   DIAGNOSIS: Prostate cancer   PREVIOUS INVESTIGATIONS:  CT scans MRI scans reviewed PSMA PET scan ordered Clinical notes reviewed Labs reviewed  HPI: Patient is a 67 year old maleStatus post bilateral AKA secondary to infection.  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  There is advanced thecal sac effacement due to bulky ventral epidural tumor.  CT scan showed diffuse sclerotic bone metastasis.  His PSA was over 700.  He has been started on Firmagon I been asked to evaluate him for possible palliative radiation therapy to his spine.  Patient is doing fairly well at this time.  PLANNED TREATMENT REGIMEN: Palliative radiation therapy to his lumbar spine after review of PSMA PET scan  PAST MEDICAL HISTORY:  has a past medical history of Depression, History of blood clots (08/13/2011), History of left knee replacement, HTN (hypertension), Lymphedema, PE (pulmonary thromboembolism) (HCC), and S/P AKA (above knee amputation) bilateral (HCC).    PAST SURGICAL HISTORY:  Past Surgical History:  Procedure Laterality Date   ACHILLES TENDON SURGERY Left    per pt   bone clip removal Left 10/04/2015   knee   CARDIAC SURGERY  11/2012   cath.-per pt   CERVICAL FUSION  07/28/2015   per pt   EPIGASTRIC HERNIA REPAIR     per pt   hematoma removal Left 11/02/2015   knee   HIP SURGERY Right    x8 per pt   KNEE FUSION Right    KNEE JOINT MANIPULATION Left  09/11/2011   KNEE SURGERY Left    x3   leg stump Left 03/2017   leg surgery-per pt   NECK SURGERY  02/2015   plates-per pt   REPLACEMENT TOTAL KNEE Right    REPLACEMENT TOTAL KNEE Left 07/02/2011   again in 07/10/2011 per pt   s/p of bilateral AKA Right    screen filter removal  10/20/2008   for embolism per pt   screen filter replacement     x3   SHOULDER SURGERY Right    x2-per pt   SPINE SURGERY  02/11/2015   vertebra spacer-per pt   toe nail removal Right    per pt   VARICOSE VEIN SURGERY     x5-per pt    FAMILY HISTORY: family history includes Alzheimer's disease in his mother; Lung cancer in his father.  SOCIAL HISTORY:  reports that he has never smoked. He has never used smokeless tobacco. He reports that he does not drink alcohol and does not use drugs.  ALLERGIES: Bacitracin, Vancomycin, Ketorolac, and Spironolactone  MEDICATIONS:  Current Outpatient Medications  Medication Sig Dispense Refill   buPROPion (WELLBUTRIN XL) 300 MG 24 hr tablet Take 300 mg by mouth daily.     dexamethasone (DECADRON) 4 MG tablet Once a day- in AM with breakfast. 30 tablet 0   lisinopril (ZESTRIL) 20 MG tablet Take 20 mg by mouth 2 (two) times daily.     oxyCODONE-acetaminophen (PERCOCET/ROXICET) 5-325 MG tablet  Take 1 tablet by mouth every 4 (four) hours as needed for moderate pain. (Patient not taking: Reported on 10/04/2022) 16 tablet 0   senna-docusate (SENOKOT-S) 8.6-50 MG tablet Take 2 tablets by mouth 2 (two) times daily as needed for mild constipation. (Patient not taking: Reported on 10/04/2022) 100 tablet 0   warfarin (COUMADIN) 5 MG tablet Take 1 tablet (5 mg total) by mouth daily.     No current facility-administered medications for this encounter.    ECOG PERFORMANCE STATUS:  2 - Symptomatic, <50% confined to bed  REVIEW OF SYSTEMS: Patient has bilateral AKA.  Has a history of depression hypertension PE. Patient denies any weight loss, fatigue, weakness, fever, chills or  night sweats. Patient denies any loss of vision, blurred vision. Patient denies any ringing  of the ears or hearing loss. No irregular heartbeat. Patient denies heart murmur or history of fainting. Patient denies any chest pain or pain radiating to her upper extremities. Patient denies any shortness of breath, difficulty breathing at night, cough or hemoptysis. Patient denies any swelling in the lower legs. Patient denies any nausea vomiting, vomiting of blood, or coffee ground material in the vomitus. Patient denies any stomach pain. Patient states has had normal bowel movements no significant constipation or diarrhea. Patient denies any dysuria, hematuria or significant nocturia. Patient denies any problems walking, swelling in the joints or loss of balance. Patient denies any skin changes, loss of hair or loss of weight. Patient denies any excessive worrying or anxiety or significant depression. Patient denies any problems with insomnia. Patient denies excessive thirst, polyuria, polydipsia. Patient denies any swollen glands, patient denies easy bruising or easy bleeding. Patient denies any recent infections, allergies or URI. Patient "s visual fields have not changed significantly in recent time.   PHYSICAL EXAM: There were no vitals taken for this visit. Patient is status post bilateral AKA.  Deep palpation of the spine does not elicit pain.  Lungs are clear to AMP cardiac examination essentially unremarkable.  Abdomen is benign.  LABORATORY DATA: Labs reviewed.    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  Will concentrate on his lower lumbar spine and possibly pelvis depending on results of the PET scan.  Risks and benefits of radiation  including fatigue possible diarrhea possible skin reaction alteration blood counts all reviewed in detail with the patient.  I have set him up for simulation after his PET scan is complete.  I would like to take this opportunity to thank you for allowing me to participate in the care of your patient.Carmina Miller, MD

## 2022-10-04 NOTE — Assessment & Plan Note (Addendum)
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  On the thecal sac.  PSA 700.  Decided to hold off any biopsy given the absence of any nodal metastases noted on imaging.  And also given above clinical findings.  Check guardant testing. Check PSMA PET scan.   # I reviewed the natural history/stage of patient's prostate cancer in detail.  Discussed that unfortunately patient's cancer cannot be cured.  All treatments are palliative and not curative. Discussed that the median life expectancy in the order of 3 to 5 years; also understand these statistics do not belong to the patient.   # I reviewed the recent data from ARASENS/PEACE-1 clinical trial in which patients with high risk/de novo castrate sensitive metastatic prostate cancer-treated with triple therapy [ADT + Taxotere+ Darolutamide or Abiraterone]-showed PFS/overall survival benefit compared to doublet ADT plus Taxotere.  However I am very concerned about patient's tolerance to chemotherapy given his bilateral AKA/limited mobility/phantom pain etc.  # Discussed the mechanism of action of Firmagon- testosterone deprivation; understands treatments are palliative not curative.  Discussed the potential side effects including but not limited to hot flashes fatigue.   # Back pain pain medication: Patient currently not taking oxycodone.  Has a prescription.  Recommend dexamethasone 4 mg prescription.  Sent.  Also evaluated with Dr. Aggie Cosier.   # CAD; bil AKA- on coumadin.   # DISPOSITION: # no labs today # firmagon today # follow up in 4 weeks- MD; labs- cbc/cmp;PSA; Guardant testing;  PSMA scan ASAP - dr.B  # 40 minutes face-to-face with the patient discussing the above plan of care; more than 50% of time spent on prognosis/ natural history; counseling and coordination.

## 2022-10-04 NOTE — Progress Notes (Signed)
No concerns today 

## 2022-10-08 ENCOUNTER — Inpatient Hospital Stay: Payer: Medicare PPO

## 2022-10-08 NOTE — Progress Notes (Signed)
[facility] Clinical Social Work  Initial Assessment   [name] is a 67 y.o. year old male contacted by phone. Clinical Social Work was referred by self for assessment of psychosocial needs.   SDOH (Social Determinants of Health) assessments performed: Yes   SDOH Screenings   Food Insecurity: No Food Insecurity (09/30/2022)  Housing: Low Risk  (09/30/2022)  Transportation Needs: No Transportation Needs (09/30/2022)  Utilities: Not At Risk (09/30/2022)  Financial Resource Strain: High Risk (07/28/2021)   Received from [facility], [facility]  Physical Activity: Sufficiently Active (07/28/2021)   Received from [facility], [facility]  Social Connections: Socially Isolated (08/01/2021)   Received from [facility], [facility]  Stress: Stress Concern Present (08/01/2021)   Received from [facility], [facility]  Tobacco Use: Low Risk  (10/04/2022)  Recent Concern: Tobacco Use - Medium Risk (09/25/2022)   Received from [facility]     Distress Screen completed: No    10/04/2022   10:38 AM  ONCBCN DISTRESS SCREENING  Screening Type Initial Screening  Distress experienced in past week (1-10) 10  Information Concerns Type Lack of info about diagnosis;Lack of info about treatment      Family/Social Information:  Housing Arrangement: patient lives alone Family members/support persons in your life? Medical Staff.  His wife became estranged a few years ago.  He is from [place] and stated he has been a recluse for three years. Transportation concerns: yes.  He has a motorized w/c and said he cannot obtain transportation.  He is paying people now for transportation and uses a manual w/c. Employment: Legally disabled  Income source: Secretary/administrator concerns: Yes, current concerns Type of concern: Rent/  mortgage Food access concerns: yes Religious or spiritual practice: Not known Services Currently in place:  [employer]  Coping/ Adjustment to diagnosis: Patient understands treatment plan and what happens next? yes Concerns about diagnosis and/or treatment: Feelings of anger or sadness Patient reported stressors: Housing Hopes and/or priorities: His dog, Yogi. Patient enjoys exercise Current coping skills/ strengths: Active sense of humor , Capable of independent living , Communication skills , General fund of knowledge , and Work skills     SUMMARY: Current SDOH Barriers:  Financial constraints related to fixed income.  Clinical Social Work Clinical Goal(s):  Explore community resource options for unmet needs related to:  Scientist, clinical (histocompatibility and immunogenetics) Strain   Interventions: Discussed common feeling and emotions when being diagnosed with cancer, and the importance of support during treatment Informed patient of the support team roles and support services at [facility] Provided CSW contact information and encouraged patient to call with any questions or concerns Provided patient with information about the [employer] and Programme researcher, broadcasting/film/video.  Will also make referral to [email or web address].   Follow Up Plan: Patient will contact CSW with any support or resource needs Patient verbalizes understanding of plan: Yes    Dorothey Baseman, LCSW Clinical Social Worker [facility]

## 2022-10-12 ENCOUNTER — Inpatient Hospital Stay: Payer: Medicare PPO

## 2022-10-12 ENCOUNTER — Ambulatory Visit: Admission: RE | Admit: 2022-10-12 | Payer: Medicare PPO | Source: Ambulatory Visit

## 2022-10-12 DIAGNOSIS — C7951 Secondary malignant neoplasm of bone: Secondary | ICD-10-CM | POA: Insufficient documentation

## 2022-10-12 DIAGNOSIS — R911 Solitary pulmonary nodule: Secondary | ICD-10-CM | POA: Insufficient documentation

## 2022-10-12 DIAGNOSIS — C61 Malignant neoplasm of prostate: Secondary | ICD-10-CM | POA: Insufficient documentation

## 2022-10-12 MED ORDER — PIFLIFOLASTAT F 18 (PYLARIFY) INJECTION
9.0000 | Freq: Once | INTRAVENOUS | Status: AC
  Administered 2022-10-12: 9.52 via INTRAVENOUS

## 2022-10-12 NOTE — Progress Notes (Signed)
[facility] CSW Progress Note  Clinical Child psychotherapist contacted patient by phone to assess needs per his request.  Informed him the Financial Counselor would be returning next week.  He stated he is having difficulty with utility bills.  Provided other possible resources, including Designer, television/film set.  He will also pick up a bag of food from the front desk on Tuesday, 8/13.    Dorothey Baseman, LCSW Clinical Social Worker [facility]

## 2022-10-15 NOTE — Progress Notes (Signed)
Spoke with [name] regarding the [employer], he will be in tomorrow to sign and bring in proof of income.

## 2022-10-16 ENCOUNTER — Ambulatory Visit
Admission: RE | Admit: 2022-10-16 | Discharge: 2022-10-16 | Disposition: A | Discharge status: Home / Self Care | Payer: Medicare PPO | Source: Ambulatory Visit | Attending: Radiation Oncology | Admitting: Radiation Oncology

## 2022-10-16 DIAGNOSIS — Z510 Encounter for antineoplastic radiation therapy: Secondary | ICD-10-CM | POA: Diagnosis not present

## 2022-10-17 NOTE — Progress Notes (Signed)
Enrolled [name] into the [employer]

## 2022-10-18 DIAGNOSIS — Z510 Encounter for antineoplastic radiation therapy: Secondary | ICD-10-CM | POA: Diagnosis not present

## 2022-10-19 ENCOUNTER — Other Ambulatory Visit: Payer: Self-pay | Admitting: *Deleted

## 2022-10-19 DIAGNOSIS — C7951 Secondary malignant neoplasm of bone: Secondary | ICD-10-CM

## 2022-10-19 DIAGNOSIS — C61 Malignant neoplasm of prostate: Secondary | ICD-10-CM

## 2022-10-22 ENCOUNTER — Ambulatory Visit
Admission: RE | Admit: 2022-10-22 | Discharge: 2022-10-22 | Disposition: A | Discharge status: Home / Self Care | Payer: Medicare PPO | Source: Ambulatory Visit | Attending: Radiation Oncology | Admitting: Radiation Oncology

## 2022-10-22 ENCOUNTER — Other Ambulatory Visit: Payer: Self-pay

## 2022-10-22 DIAGNOSIS — Z510 Encounter for antineoplastic radiation therapy: Secondary | ICD-10-CM | POA: Diagnosis not present

## 2022-10-22 LAB — RAD ONC ARIA SESSION SUMMARY
Course Elapsed Days: 0
Plan Fractions Treated to Date: 1
Plan Prescribed Dose Per Fraction: 3 Gy
Plan Total Fractions Prescribed: 10
Plan Total Prescribed Dose: 30 Gy
Reference Point Dosage Given to Date: 3 Gy
Reference Point Session Dosage Given: 3 Gy
Session Number: 1

## 2022-10-23 ENCOUNTER — Ambulatory Visit
Admission: RE | Admit: 2022-10-23 | Discharge: 2022-10-23 | Disposition: A | Discharge status: Home / Self Care | Payer: Medicare PPO | Source: Ambulatory Visit | Attending: Radiation Oncology | Admitting: Radiation Oncology

## 2022-10-23 ENCOUNTER — Other Ambulatory Visit: Payer: Self-pay

## 2022-10-23 DIAGNOSIS — Z510 Encounter for antineoplastic radiation therapy: Secondary | ICD-10-CM | POA: Diagnosis not present

## 2022-10-23 LAB — RAD ONC ARIA SESSION SUMMARY
Course Elapsed Days: 1
Plan Fractions Treated to Date: 2
Plan Prescribed Dose Per Fraction: 3 Gy
Plan Total Fractions Prescribed: 10
Plan Total Prescribed Dose: 30 Gy
Reference Point Dosage Given to Date: 6 Gy
Reference Point Session Dosage Given: 3 Gy
Session Number: 2

## 2022-10-24 ENCOUNTER — Ambulatory Visit
Admission: RE | Admit: 2022-10-24 | Discharge: 2022-10-24 | Disposition: A | Discharge status: Home / Self Care | Payer: Medicare PPO | Source: Ambulatory Visit | Attending: Radiation Oncology | Admitting: Radiation Oncology

## 2022-10-24 ENCOUNTER — Other Ambulatory Visit: Payer: Self-pay

## 2022-10-24 DIAGNOSIS — Z510 Encounter for antineoplastic radiation therapy: Secondary | ICD-10-CM | POA: Diagnosis not present

## 2022-10-24 LAB — RAD ONC ARIA SESSION SUMMARY
Course Elapsed Days: 2
Plan Fractions Treated to Date: 3
Plan Prescribed Dose Per Fraction: 3 Gy
Plan Total Fractions Prescribed: 10
Plan Total Prescribed Dose: 30 Gy
Reference Point Dosage Given to Date: 9 Gy
Reference Point Session Dosage Given: 3 Gy
Session Number: 3

## 2022-10-25 ENCOUNTER — Inpatient Hospital Stay: Payer: Medicare PPO

## 2022-10-25 ENCOUNTER — Other Ambulatory Visit: Payer: Self-pay

## 2022-10-25 ENCOUNTER — Ambulatory Visit
Admission: RE | Admit: 2022-10-25 | Discharge: 2022-10-25 | Disposition: A | Discharge status: Home / Self Care | Payer: Medicare PPO | Source: Ambulatory Visit | Attending: Radiation Oncology | Admitting: Radiation Oncology

## 2022-10-25 ENCOUNTER — Other Ambulatory Visit: Payer: Self-pay | Admitting: *Deleted

## 2022-10-25 DIAGNOSIS — Z510 Encounter for antineoplastic radiation therapy: Secondary | ICD-10-CM | POA: Diagnosis not present

## 2022-10-25 DIAGNOSIS — C61 Malignant neoplasm of prostate: Secondary | ICD-10-CM

## 2022-10-25 LAB — CBC (CANCER CENTER ONLY)
HCT: 42.9 % (ref 39.0–52.0)
Hemoglobin: 14.2 g/dL (ref 13.0–17.0)
MCH: 29.8 pg (ref 26.0–34.0)
MCHC: 33.1 g/dL (ref 30.0–36.0)
MCV: 90.1 fL (ref 80.0–100.0)
Platelet Count: 235 10*3/uL (ref 150–400)
RBC: 4.76 MIL/uL (ref 4.22–5.81)
RDW: 13.3 % (ref 11.5–15.5)
WBC Count: 4.7 10*3/uL (ref 4.0–10.5)
nRBC: 0 % (ref 0.0–0.2)

## 2022-10-25 LAB — RAD ONC ARIA SESSION SUMMARY
Course Elapsed Days: 3
Plan Fractions Treated to Date: 4
Plan Prescribed Dose Per Fraction: 3 Gy
Plan Total Fractions Prescribed: 10
Plan Total Prescribed Dose: 30 Gy
Reference Point Dosage Given to Date: 12 Gy
Reference Point Session Dosage Given: 3 Gy
Session Number: 4

## 2022-10-25 MED ORDER — ONDANSETRON HCL 8 MG PO TABS: 8.0000 mg | ORAL_TABLET | Freq: Three times a day (TID) | ORAL | 0 refills | Status: DC | PRN

## 2022-10-26 ENCOUNTER — Ambulatory Visit
Admission: RE | Admit: 2022-10-26 | Discharge: 2022-10-26 | Disposition: A | Discharge status: Home / Self Care | Payer: Medicare PPO | Source: Ambulatory Visit | Attending: Radiation Oncology | Admitting: Radiation Oncology

## 2022-10-26 ENCOUNTER — Other Ambulatory Visit: Payer: Self-pay

## 2022-10-26 DIAGNOSIS — Z510 Encounter for antineoplastic radiation therapy: Secondary | ICD-10-CM | POA: Diagnosis not present

## 2022-10-26 LAB — RAD ONC ARIA SESSION SUMMARY
Course Elapsed Days: 4
Plan Fractions Treated to Date: 5
Plan Prescribed Dose Per Fraction: 3 Gy
Plan Total Fractions Prescribed: 10
Plan Total Prescribed Dose: 30 Gy
Reference Point Dosage Given to Date: 15 Gy
Reference Point Session Dosage Given: 3 Gy
Session Number: 5

## 2022-10-29 ENCOUNTER — Ambulatory Visit
Admission: RE | Admit: 2022-10-29 | Discharge: 2022-10-29 | Disposition: A | Discharge status: Home / Self Care | Payer: Medicare PPO | Source: Ambulatory Visit | Attending: Radiation Oncology | Admitting: Radiation Oncology

## 2022-10-29 ENCOUNTER — Other Ambulatory Visit: Payer: Self-pay

## 2022-10-29 DIAGNOSIS — Z510 Encounter for antineoplastic radiation therapy: Secondary | ICD-10-CM | POA: Diagnosis not present

## 2022-10-29 LAB — RAD ONC ARIA SESSION SUMMARY
Course Elapsed Days: 7
Plan Fractions Treated to Date: 6
Plan Prescribed Dose Per Fraction: 3 Gy
Plan Total Fractions Prescribed: 10
Plan Total Prescribed Dose: 30 Gy
Reference Point Dosage Given to Date: 18 Gy
Reference Point Session Dosage Given: 3 Gy
Session Number: 6

## 2022-10-30 ENCOUNTER — Ambulatory Visit
Admission: RE | Admit: 2022-10-30 | Discharge: 2022-10-30 | Disposition: A | Discharge status: Home / Self Care | Payer: Medicare PPO | Source: Ambulatory Visit | Attending: Radiation Oncology | Admitting: Radiation Oncology

## 2022-10-30 ENCOUNTER — Other Ambulatory Visit: Payer: Self-pay

## 2022-10-30 DIAGNOSIS — Z510 Encounter for antineoplastic radiation therapy: Secondary | ICD-10-CM | POA: Diagnosis not present

## 2022-10-30 LAB — RAD ONC ARIA SESSION SUMMARY
Course Elapsed Days: 8
Plan Fractions Treated to Date: 7
Plan Prescribed Dose Per Fraction: 3 Gy
Plan Total Fractions Prescribed: 10
Plan Total Prescribed Dose: 30 Gy
Reference Point Dosage Given to Date: 21 Gy
Reference Point Session Dosage Given: 3 Gy
Session Number: 7

## 2022-10-31 ENCOUNTER — Other Ambulatory Visit: Payer: Self-pay

## 2022-10-31 ENCOUNTER — Ambulatory Visit
Admission: RE | Admit: 2022-10-31 | Discharge: 2022-10-31 | Disposition: A | Discharge status: Home / Self Care | Payer: Medicare PPO | Source: Ambulatory Visit | Attending: Radiation Oncology | Admitting: Radiation Oncology

## 2022-10-31 DIAGNOSIS — Z510 Encounter for antineoplastic radiation therapy: Secondary | ICD-10-CM | POA: Diagnosis not present

## 2022-10-31 LAB — RAD ONC ARIA SESSION SUMMARY
Course Elapsed Days: 9
Plan Fractions Treated to Date: 8
Plan Prescribed Dose Per Fraction: 3 Gy
Plan Total Fractions Prescribed: 10
Plan Total Prescribed Dose: 30 Gy
Reference Point Dosage Given to Date: 24 Gy
Reference Point Session Dosage Given: 3 Gy
Session Number: 8

## 2022-11-01 ENCOUNTER — Inpatient Hospital Stay: Payer: Medicare PPO

## 2022-11-01 ENCOUNTER — Inpatient Hospital Stay (HOSPITAL_BASED_OUTPATIENT_CLINIC_OR_DEPARTMENT_OTHER): Payer: Medicare PPO | Admitting: Internal Medicine

## 2022-11-01 ENCOUNTER — Ambulatory Visit
Admission: RE | Admit: 2022-11-01 | Discharge: 2022-11-01 | Disposition: A | Discharge status: Home / Self Care | Payer: Medicare PPO | Source: Ambulatory Visit | Attending: Radiation Oncology | Admitting: Radiation Oncology

## 2022-11-01 ENCOUNTER — Telehealth: Payer: Self-pay | Admitting: *Deleted

## 2022-11-01 ENCOUNTER — Telehealth: Payer: Self-pay

## 2022-11-01 ENCOUNTER — Other Ambulatory Visit: Payer: Self-pay

## 2022-11-01 ENCOUNTER — Other Ambulatory Visit (HOSPITAL_COMMUNITY): Payer: Self-pay

## 2022-11-01 ENCOUNTER — Encounter: Payer: Self-pay | Admitting: Internal Medicine

## 2022-11-01 ENCOUNTER — Telehealth: Payer: Self-pay | Admitting: Pharmacist

## 2022-11-01 VITALS — BP 116/67 | HR 66 | Temp 97.8°F | Resp 20 | Wt 288.0 lb

## 2022-11-01 DIAGNOSIS — C61 Malignant neoplasm of prostate: Secondary | ICD-10-CM | POA: Diagnosis not present

## 2022-11-01 DIAGNOSIS — C7951 Secondary malignant neoplasm of bone: Secondary | ICD-10-CM | POA: Diagnosis not present

## 2022-11-01 DIAGNOSIS — Z510 Encounter for antineoplastic radiation therapy: Secondary | ICD-10-CM | POA: Diagnosis not present

## 2022-11-01 LAB — RAD ONC ARIA SESSION SUMMARY
Course Elapsed Days: 10
Plan Fractions Treated to Date: 9
Plan Prescribed Dose Per Fraction: 3 Gy
Plan Total Fractions Prescribed: 10
Plan Total Prescribed Dose: 30 Gy
Reference Point Dosage Given to Date: 27 Gy
Reference Point Session Dosage Given: 3 Gy
Session Number: 9

## 2022-11-01 LAB — CMP (CANCER CENTER ONLY)
ALT: 14 U/L (ref 0–44)
AST: 18 U/L (ref 15–41)
Albumin: 4.2 g/dL (ref 3.5–5.0)
Alkaline Phosphatase: 1137 U/L — ABNORMAL HIGH (ref 38–126)
Anion gap: 6 (ref 5–15)
BUN: 18 mg/dL (ref 8–23)
CO2: 23 mmol/L (ref 22–32)
Calcium: 9.4 mg/dL (ref 8.9–10.3)
Chloride: 108 mmol/L (ref 98–111)
Creatinine: 0.78 mg/dL (ref 0.61–1.24)
GFR, Estimated: >60 mL/min (ref >60)
Glucose, Bld: 80 mg/dL (ref 70–99)
Potassium: 3.9 mmol/L (ref 3.5–5.1)
Sodium: 137 mmol/L (ref 135–145)
Total Bilirubin: 1.2 mg/dL (ref 0.3–1.2)
Total Protein: 7.5 g/dL (ref 6.5–8.1)

## 2022-11-01 LAB — CBC WITH DIFFERENTIAL (CANCER CENTER ONLY)
Abs Immature Granulocytes: 0.01 10*3/uL (ref 0.00–0.07)
Basophils Absolute: 0 10*3/uL (ref 0.0–0.1)
Basophils Relative: 1 %
Eosinophils Absolute: 0.2 10*3/uL (ref 0.0–0.5)
Eosinophils Relative: 5 %
HCT: 41.3 % (ref 39.0–52.0)
Hemoglobin: 13.6 g/dL (ref 13.0–17.0)
Immature Granulocytes: 0 %
Lymphocytes Relative: 16 %
Lymphs Abs: 0.6 10*3/uL — ABNORMAL LOW (ref 0.7–4.0)
MCH: 30 pg (ref 26.0–34.0)
MCHC: 32.9 g/dL (ref 30.0–36.0)
MCV: 91 fL (ref 80.0–100.0)
Monocytes Absolute: 0.5 10*3/uL (ref 0.1–1.0)
Monocytes Relative: 12 %
Neutro Abs: 2.6 10*3/uL (ref 1.7–7.7)
Neutrophils Relative %: 66 %
Platelet Count: 194 10*3/uL (ref 150–400)
RBC: 4.54 MIL/uL (ref 4.22–5.81)
RDW: 13.2 % (ref 11.5–15.5)
WBC Count: 4 10*3/uL (ref 4.0–10.5)
nRBC: 0 % (ref 0.0–0.2)

## 2022-11-01 LAB — PSA: Prostatic Specific Antigen: 426.29 ng/mL — ABNORMAL HIGH (ref 0.00–4.00)

## 2022-11-01 MED ORDER — OXYCODONE-ACETAMINOPHEN 5-325 MG PO TABS: 1.0000 | ORAL_TABLET | Freq: Two times a day (BID) | ORAL | 0 refills | Status: DC | PRN

## 2022-11-01 NOTE — Telephone Encounter (Signed)
Oral Oncology Patient Advocate Encounter   Began application for assistance for [place]ub[place] through [employer] [place] [employer].   Application will be submitted upon completion of necessary supporting documentation.   [employer]'s phone number [phone].   I will continue to check the status until final determination.    Ardeen Fillers, CPhT Oncology Pharmacy Patient Advocate  [facility]  [phone] (phone) [phone] (fax) 11/01/2022 2:52 PM

## 2022-11-01 NOTE — Progress Notes (Signed)
[facility] OFFICE PROGRESS NOTE  Patient Care Team: Zada Finders Joycie Peek, MD as PCP - General (Family Medicine) Earna Coder, MD as Consulting Physician (Oncology) Carmina Miller, MD as Consulting Physician (Radiation Oncology)   Cancer Staging  Prostate cancer metastatic to bone [facility]) Staging form: Prostate, AJCC 8th Edition - Clinical: Stage IVB (cTX, cNX, pM1b, PSA: 703) - Signed by Earna Coder, MD on 10/04/2022 Prostate specific antigen (PSA) range: 20 or greater    Oncology History Overview Note  #HIGH RISK/ DE-NOVO CASTRATE SENSITIVE PROSTATE CANCER: CT CAP [JULY 2024]-multiple bone metastases; MRI lumbar spine without contrast  [PCP]-L4-L5 lesion.  On the thecal sac.  PSA 700.   Check guardant testing. NO Biopsy. AUG 9th, 2024- PSMA PET - Multifocal intense radiotracer activity within the prostate gland consistent with primary prostate adenocarcinoma; Evidence of nodal metastasis with radiotracer avid tiny lymph nodes in the rectal sheath and periaortic retroperitoneum; Extensive radiotracer avid skeletal metastasis with near confluent lesions within the pelvis. Lesions in the lumbar spine, RIGHT scapula, LEFT humerus. Lesions too numerous to count. No evidence of visceral metastasis or pulmonary metastasis; awaiting guardant testing. On ADT- [started aug 1st, 2024].   # AUG 1st, 2024- Firmagon  --------------------- # CAD; on coumadin; Bil AKA    Prostate cancer metastatic to bone (HCC)  09/30/2022 Initial Diagnosis   Prostate cancer metastatic to bone (HCC)   10/04/2022 Cancer Staging   Staging form: Prostate, AJCC 8th Edition - Clinical: Stage IVB (cTX, cNX, pM1b, PSA: 703) - Signed by Earna Coder, MD on 10/04/2022 Prostate specific antigen (PSA) range: 20 or greater   11/29/2022 -  Chemotherapy   Patient is on Treatment Plan : PROSTATE Docetaxel (75) q21d        HISTORY OF PRESENT ILLNESS: Patient ambulating-with wheel chair/AKA.   Patient is alone.  [name] 67 y.o.  male pleasant patient with a medical history significant of s/p of bilateral AKA, PE on Coumadin, HTN, depression,is here to discuss his treatment options for castrate sensitive metastatic prostate cancer/and reviewed the results of the PET scan.  Patient is currently undergoing radiation.  Patient states he has ongoing pain in his lower back.  However is not taking any tramadol as recommended.  Denies any bladder or bowel incontinence.  Continues to have shooting pain in his legs stumps bilaterally.  Review of Systems  Constitutional:  Positive for malaise/fatigue. Negative for chills, diaphoresis, fever and weight loss.  HENT:  Negative for nosebleeds and sore throat.   Eyes:  Negative for double vision.  Respiratory:  Negative for cough, hemoptysis, sputum production, shortness of breath and wheezing.   Cardiovascular:  Positive for leg swelling. Negative for chest pain, palpitations and orthopnea.  Gastrointestinal:  Negative for abdominal pain, blood in stool, constipation, diarrhea, heartburn, melena, nausea and vomiting.  Genitourinary:  Negative for dysuria, frequency and urgency.  Musculoskeletal:  Positive for back pain, joint pain and myalgias.  Skin: Negative.  Negative for itching and rash.  Neurological:  Negative for dizziness, tingling, focal weakness, weakness and headaches.  Endo/Heme/Allergies:  Does not bruise/bleed easily.  Psychiatric/Behavioral:  Negative for depression. The patient is not nervous/anxious and does not have insomnia.       PAST MEDICAL HISTORY :  Past Medical History:  Diagnosis Date   Depression    History of blood clots 08/13/2011   History of left knee replacement    HTN (hypertension)    Lymphedema    lt leg-per pt  PE (pulmonary thromboembolism) (HCC)    S/P AKA (above knee amputation) bilateral (HCC)    rt 11/24/07 and lt 01/11/17    PAST SURGICAL HISTORY :   Past Surgical History:  Procedure  Laterality Date   ACHILLES TENDON SURGERY Left    per pt   bone clip removal Left 10/04/2015   knee   CARDIAC SURGERY  11/2012   cath.-per pt   CERVICAL FUSION  07/28/2015   per pt   EPIGASTRIC HERNIA REPAIR     per pt   hematoma removal Left 11/02/2015   knee   HIP SURGERY Right    x8 per pt   KNEE FUSION Right    KNEE JOINT MANIPULATION Left 09/11/2011   KNEE SURGERY Left    x3   leg stump Left 03/2017   leg surgery-per pt   NECK SURGERY  02/2015   plates-per pt   REPLACEMENT TOTAL KNEE Right    REPLACEMENT TOTAL KNEE Left 07/02/2011   again in 07/10/2011 per pt   s/p of bilateral AKA Right    screen filter removal  10/20/2008   for embolism per pt   screen filter replacement     x3   SHOULDER SURGERY Right    x2-per pt   SPINE SURGERY  02/11/2015   vertebra spacer-per pt   toe nail removal Right    per pt   VARICOSE VEIN SURGERY     x5-per pt    FAMILY HISTORY :   Family History  Problem Relation Age of Onset   Alzheimer's disease Mother    Lung cancer Father     SOCIAL HISTORY:   Social History   Tobacco Use   Smoking status: Never   Smokeless tobacco: Never  Vaping Use   Vaping status: Never Used  Substance Use Topics   Alcohol use: No   Drug use: Never    ALLERGIES:  is allergic to bacitracin, vancomycin, ketorolac, and spironolactone.  MEDICATIONS:  Current Outpatient Medications  Medication Sig Dispense Refill   buPROPion (WELLBUTRIN XL) 300 MG 24 hr tablet Take 300 mg by mouth daily.     lisinopril (ZESTRIL) 20 MG tablet Take 20 mg by mouth 2 (two) times daily.     ondansetron (ZOFRAN) 8 MG tablet Take 1 tablet (8 mg total) by mouth every 8 (eight) hours as needed for nausea or vomiting. 20 tablet 0   apixaban (ELIQUIS) 5 MG TABS tablet Take 1 tablet (5 mg total) by mouth 2 (two) times daily. 60 tablet 3   darolutamide (NUBEQA) 300 MG tablet Take 600 mg by mouth 2 (two) times daily with a meal.     dexamethasone (DECADRON) 4 MG tablet Once  a day- in AM with breakfast. (Patient not taking: Reported on 11/01/2022) 30 tablet 0   enoxaparin (LOVENOX) 120 MG/0.8ML injection Inject 0.8 mLs (120 mg total) into the skin daily. Take in the morning.  Start taking on 09/07 until 09/10. Do not take on 9/11- on the day of the procedure. 4 mL 0   oxyCODONE-acetaminophen (PERCOCET/ROXICET) 5-325 MG tablet Take 1 tablet by mouth every 12 (twelve) hours as needed for moderate pain. 60 tablet 0   senna-docusate (SENOKOT-S) 8.6-50 MG tablet Take 2 tablets by mouth 2 (two) times daily as needed for mild constipation. (Patient not taking: Reported on 10/04/2022) 100 tablet 0   warfarin (COUMADIN) 5 MG tablet Take 1 tablet (5 mg total) by mouth daily.     No current facility-administered medications for  this visit.    PHYSICAL EXAMINATION:   BP 116/67 (BP Location: Left Arm, Patient Position: Sitting, Cuff Size: Large)   Pulse 66   Temp 97.8 F (36.6 C) (Tympanic)   Resp 20   Wt 288 lb (130.6 kg)   SpO2 100%   BMI 60.19 kg/m   Filed Weights   11/01/22 1111  Weight: 288 lb (130.6 kg)    Patient has bilateral AKA.   Physical Exam Vitals and nursing note reviewed.  HENT:     Head: Normocephalic and atraumatic.     Mouth/Throat:     Pharynx: Oropharynx is clear.  Eyes:     Extraocular Movements: Extraocular movements intact.     Pupils: Pupils are equal, round, and reactive to light.  Cardiovascular:     Rate and Rhythm: Normal rate and regular rhythm.  Pulmonary:     Comments: Decreased breath sounds bilaterally.  Abdominal:     Palpations: Abdomen is soft.  Musculoskeletal:        General: Normal range of motion.     Cervical back: Normal range of motion.  Skin:    General: Skin is warm.  Neurological:     General: No focal deficit present.     Mental Status: He is alert and oriented to person, place, and time.  Psychiatric:        Behavior: Behavior normal.        Judgment: Judgment normal.      LABORATORY DATA:  I  have reviewed the data as listed    Component Value Date/Time   NA 137 11/01/2022 1043   K 3.9 11/01/2022 1043   CL 108 11/01/2022 1043   CO2 23 11/01/2022 1043   GLUCOSE 80 11/01/2022 1043   BUN 18 11/01/2022 1043   CREATININE 0.78 11/01/2022 1043   CALCIUM 9.4 11/01/2022 1043   PROT 7.5 11/01/2022 1043   ALBUMIN 4.2 11/01/2022 1043   AST 18 11/01/2022 1043   ALT 14 11/01/2022 1043   ALKPHOS 1,137 (H) 11/01/2022 1043   BILITOT 1.2 11/01/2022 1043   GFRNONAA >60 11/01/2022 1043   GFRAA >60 10/19/2016 1335    No results found for: "SPEP", "UPEP"  Lab Results  Component Value Date   WBC 4.0 11/01/2022   NEUTROABS 2.6 11/01/2022   HGB 13.6 11/01/2022   HCT 41.3 11/01/2022   MCV 91.0 11/01/2022   PLT 194 11/01/2022      Chemistry      Component Value Date/Time   NA 137 11/01/2022 1043   K 3.9 11/01/2022 1043   CL 108 11/01/2022 1043   CO2 23 11/01/2022 1043   BUN 18 11/01/2022 1043   CREATININE 0.78 11/01/2022 1043      Component Value Date/Time   CALCIUM 9.4 11/01/2022 1043   ALKPHOS 1,137 (H) 11/01/2022 1043   AST 18 11/01/2022 1043   ALT 14 11/01/2022 1043   BILITOT 1.2 11/01/2022 1043       RADIOGRAPHIC STUDIES: I have personally reviewed the radiological images as listed and agreed with the findings in the report. No results found.   ASSESSMENT & PLAN:  Prostate cancer metastatic to bone (HCC) #HIGH RISK/ DE-NOVO CASTRATE SENSITIVE PROSTATE CANCER: CT CAP [JULY 2024]-multiple bone metastases; MRI lumbar spine without contrast  [PCP]-L4-L5 lesion.  On the thecal sac. Pre treatment : PSA 700. AUG 9th, 2024- PSMA PET - Multifocal intense radiotracer activity within the prostate gland consistent with primary prostate adenocarcinoma; Evidence of nodal metastasis with radiotracer avid tiny lymph  nodes in the rectal sheath and periaortic retroperitoneum; Extensive radiotracer avid skeletal metastasis with near confluent lesions within the pelvis. Lesions in the  lumbar spine, RIGHT scapula, LEFT humerus. Lesions too numerous to count. No evidence of visceral metastasis or pulmonary metastasis; awaiting guardant testing. On ADT- [started aug 1st, 2024].   # I again  reviewed the natural history/stage of patient's prostate cancer in detail.  Discussed that unfortunately patient's cancer cannot be cured.  All treatments are palliative and not curative. Discussed that the median life expectancy in the order of 3 to 5 years.   # I reviewed the recent data from ARASENS/PEACE-1 clinical trial in which patients with high risk/de novo castrate sensitive metastatic prostate cancer-treated with triple therapy [ADT + Taxotere+ Darolutamide or Abiraterone]-showed PFS/overall survival benefit compared to doublet ADT plus Taxotere.  However, I discussed my concerns about patient's tolerance to chemotherapy given his bilateral AKA/limited mobility/phantom pain etc. however patient is interested in proceeding with aggressive therapies.  # Discussed the mechanism of action of Firmagon- testosterone deprivation; understands treatments are palliative not curative.  Discussed the potential side effects including but not limited to hot flashes fatigue.   # Back pain pain medication: Patient currently not taking oxycodone.  Has a prescription.  Dexamethasone 4 mg prescription.  S/p RT-  Dr. Aggie Cosier.   # Hx of PE-CAD; bil AKA- on coumadin.   # Chemotherapy education/nutrition evaluation: port placement. Plan start chemotherapy Sent the scripts for antiemetics-Zofran and Compazine; EMLA cream sent to pharmacy  # DISPOSITION: # refer to Josh  # refer to Chemo education ASAP- re: chemo # refer to IR re: port placement [on coumadin will need to coordinate with PCP] # Deborra Medina tomorrow # follow up in 4 weeks- MD; labs- cbc/cmp;PSA; vit D25-OH;  taxotere; Firmagon-Dr.B  # I reviewed the blood work- with the patient in detail; also reviewed the imaging independently [as summarized  above]; and with the patient in detail.     Orders Placed This Encounter  Procedures   IR IMAGING GUIDED PORT INSERTION    ON coumadin    Standing Status:   Future    Standing Expiration Date:   11/01/2023    Order Specific Question:   Reason for Exam (SYMPTOM  OR DIAGNOSIS REQUIRED)    Answer:   Chemotherapy access    Order Specific Question:   Preferred Imaging Location?    Answer:   [facility]   CBC with Differential (Cancer Center Only)    Standing Status:   Future    Standing Expiration Date:   11/29/2023   CMP (Cancer Center only)    Standing Status:   Future    Standing Expiration Date:   11/29/2023   PSA    Standing Status:   Future    Standing Expiration Date:   11/01/2023   VITAMIN D 25 Hydroxy (Vit-D Deficiency, Fractures)    Standing Status:   Future    Standing Expiration Date:   11/01/2023   All questions were answered. The patient knows to call the clinic with any problems, questions or concerns.      Earna Coder, MD 11/11/2022 11:10 PM

## 2022-11-01 NOTE — Telephone Encounter (Addendum)
Oral Oncology Patient Advocate Encounter  Prior Authorization for Merleen Nicely has been approved.    PA# [id number]  Effective dates: 11/01/22 through 04/30/23  Patients co-pay is $3,236.14.   PAP initiated. See additional encounter.   Ardeen Fillers, CPhT Oncology Pharmacy Patient Advocate  [facility]  [phone] (phone) [phone] (fax) 11/01/2022 2:26 PM

## 2022-11-01 NOTE — Progress Notes (Signed)
START ON PATHWAY REGIMEN - Prostate     Darolutamide: A cycle is every 28 days:     Darolutamide    Docetaxel cycles 1 through 6: A cycle is every 21 days:     Docetaxel   **Always confirm dose/schedule in your pharmacy ordering system**  Patient Characteristics: Adenocarcinoma, Recurrent/New Systemic Disease (Including Biochemical Recurrence), Non-Castrate, M1 Histology: Adenocarcinoma Therapeutic Status: Recurrent/New Systemic Disease (Including Biochemical Recurrence) Intent of Therapy: Non-Curative / Palliative Intent, Discussed with Patient 

## 2022-11-01 NOTE — Progress Notes (Signed)
No concerns for the provider today, he has a few questions.

## 2022-11-01 NOTE — Telephone Encounter (Signed)
Clinical Pharmacist Practitioner Encounter   Received new prescription for Nubeqa (darolutamide) for the treatment of metastatic castration sensitive prostate cancer in conjunction with docetaxel and ADT, planned duration until disease progression or unacceptable drug toxicity.  CMP from 11/01/22 assessed, no relevant lab abnormalities. Prescription dose and frequency assessed.   Current medication list in Epic reviewed, no DDIs with darolutamide identified.  Evaluated chart and no patient barriers to medication adherence identified.   Prescription has been e-scribed to the [facility] for benefits analysis and approval.  Oral Oncology Clinic will continue to follow for insurance authorization, copayment issues, initial counseling and start date.   Remi Haggard, PharmD, BCPS, BCOP, CPP Hematology/Oncology Clinical Pharmacist Practitioner [place] [phone]  11/01/2022 12:10 PM

## 2022-11-01 NOTE — Assessment & Plan Note (Addendum)
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  On the thecal sac. Pre treatment : PSA 700. AUG 9th, 2024- PSMA PET - Multifocal intense radiotracer activity within the prostate gland consistent with primary prostate adenocarcinoma; Evidence of nodal metastasis with radiotracer avid tiny lymph nodes in the rectal sheath and periaortic retroperitoneum; Extensive radiotracer avid skeletal metastasis with near confluent lesions within the pelvis. Lesions in the lumbar spine, RIGHT scapula, LEFT humerus. Lesions too numerous to count. No evidence of visceral metastasis or pulmonary metastasis; awaiting guardant testing. On ADT- [started aug 1st, 2024].   # I again  reviewed the natural history/stage of patient's prostate cancer in detail.  Discussed that unfortunately patient's cancer cannot be cured.  All treatments are palliative and not curative. Discussed that the median life expectancy in the order of 3 to 5 years.   # I reviewed the recent data from ARASENS/PEACE-1 clinical trial in which patients with high risk/de novo castrate sensitive metastatic prostate cancer-treated with triple therapy [ADT + Taxotere+ Darolutamide or Abiraterone]-showed PFS/overall survival benefit compared to doublet ADT plus Taxotere.  However, I discussed my concerns about patient's tolerance to chemotherapy given his bilateral AKA/limited mobility/phantom pain etc. however patient is interested in proceeding with aggressive therapies.  # Discussed the mechanism of action of Firmagon- testosterone deprivation; understands treatments are palliative not curative.  Discussed the potential side effects including but not limited to hot flashes fatigue.   # Back pain pain medication: Patient currently not taking oxycodone.  Has a prescription.  Dexamethasone 4 mg prescription.  S/p RT-  Dr. Aggie Cosier.   # Hx of PE-CAD; bil AKA-  on coumadin.   # Chemotherapy education/nutrition evaluation: port placement. Plan start chemotherapy Sent the scripts for antiemetics-Zofran and Compazine; EMLA cream sent to pharmacy  # DISPOSITION: # refer to Josh  # refer to Chemo education ASAP- re: chemo # refer to IR re: port placement [on coumadin will need to coordinate with PCP] # Deborra Medina tomorrow # follow up in 4 weeks- MD; labs- cbc/cmp;PSA; vit D25-OH;  taxotere; Firmagon-Dr.B  # I reviewed the blood work- with the patient in detail; also reviewed the imaging independently [as summarized above]; and with the patient in detail.

## 2022-11-01 NOTE — Telephone Encounter (Signed)
Faxed request for coumadin clearance to PCP Dr. Zada Finders at [facility]. Pt needing port placement and will need to hold coumadin 4 days prior to procedure.

## 2022-11-01 NOTE — Telephone Encounter (Signed)
Oral Oncology Patient Advocate Encounter  New authorization   Received notification that prior authorization for Nubeqa is required.   PA submitted on 11/01/22  Key [id number]  Status is pending     Ardeen Fillers, CPhT Oncology Pharmacy Patient Advocate  [facility]  [phone] (phone) [phone] (fax) 11/01/2022 2:13 PM

## 2022-11-02 ENCOUNTER — Inpatient Hospital Stay: Payer: Medicare PPO

## 2022-11-02 ENCOUNTER — Telehealth: Payer: Self-pay

## 2022-11-02 ENCOUNTER — Other Ambulatory Visit: Payer: Self-pay

## 2022-11-02 ENCOUNTER — Ambulatory Visit
Admission: RE | Admit: 2022-11-02 | Discharge: 2022-11-02 | Disposition: A | Discharge status: Home / Self Care | Payer: Medicare PPO | Source: Ambulatory Visit | Attending: Radiation Oncology | Admitting: Radiation Oncology

## 2022-11-02 DIAGNOSIS — C7951 Secondary malignant neoplasm of bone: Secondary | ICD-10-CM

## 2022-11-02 DIAGNOSIS — Z510 Encounter for antineoplastic radiation therapy: Secondary | ICD-10-CM | POA: Diagnosis not present

## 2022-11-02 LAB — RAD ONC ARIA SESSION SUMMARY
Course Elapsed Days: 11
Plan Fractions Treated to Date: 10
Plan Prescribed Dose Per Fraction: 3 Gy
Plan Total Fractions Prescribed: 10
Plan Total Prescribed Dose: 30 Gy
Reference Point Dosage Given to Date: 30 Gy
Reference Point Session Dosage Given: 3 Gy
Session Number: 10

## 2022-11-02 MED ORDER — DEGARELIX ACETATE(240 MG DOSE) 120 MG/VIAL ~~LOC~~ SOLR
80.0000 mg | Freq: Once | SUBCUTANEOUS | Status: AC
  Administered 2022-11-02: 80 mg via SUBCUTANEOUS
  Filled 2022-11-02: qty 2

## 2022-11-02 NOTE — Telephone Encounter (Signed)
Oral Oncology Patient Advocate Encounter   Submitted application for assistance for Nubeqa to [employer] [place] [employer].   Application submitted via e-fax to [phone]   [employer]'s phone number [phone].   I will continue to check the status until final determination.    Ardeen Fillers, CPhT Oncology Pharmacy Patient Advocate  [facility]  [phone] (phone) [phone] (fax) 11/02/2022 10:10 AM

## 2022-11-02 NOTE — Telephone Encounter (Signed)
Morrie Sheldon called regarding clearance for port placement. Dr. Zada Finders is giving clearance to stop warfarin for 4 days, however is requesting patient be provided with Lovenox bridge while holding warfarin. Look out for fax request being sent with details. Please call nurse back at [phone] with concerns.

## 2022-11-06 ENCOUNTER — Inpatient Hospital Stay: Payer: Medicare PPO

## 2022-11-06 ENCOUNTER — Ambulatory Visit: Payer: Medicare PPO

## 2022-11-06 DIAGNOSIS — M25512 Pain in left shoulder: Secondary | ICD-10-CM | POA: Insufficient documentation

## 2022-11-06 DIAGNOSIS — F32A Depression, unspecified: Secondary | ICD-10-CM | POA: Insufficient documentation

## 2022-11-06 DIAGNOSIS — Z79899 Other long term (current) drug therapy: Secondary | ICD-10-CM | POA: Insufficient documentation

## 2022-11-06 DIAGNOSIS — Z5111 Encounter for antineoplastic chemotherapy: Secondary | ICD-10-CM | POA: Insufficient documentation

## 2022-11-06 DIAGNOSIS — Z89611 Acquired absence of right leg above knee: Secondary | ICD-10-CM | POA: Insufficient documentation

## 2022-11-06 DIAGNOSIS — Z89612 Acquired absence of left leg above knee: Secondary | ICD-10-CM | POA: Insufficient documentation

## 2022-11-06 DIAGNOSIS — Z86711 Personal history of pulmonary embolism: Secondary | ICD-10-CM | POA: Insufficient documentation

## 2022-11-06 DIAGNOSIS — Z801 Family history of malignant neoplasm of trachea, bronchus and lung: Secondary | ICD-10-CM | POA: Insufficient documentation

## 2022-11-06 DIAGNOSIS — M549 Dorsalgia, unspecified: Secondary | ICD-10-CM | POA: Insufficient documentation

## 2022-11-06 DIAGNOSIS — C61 Malignant neoplasm of prostate: Secondary | ICD-10-CM | POA: Insufficient documentation

## 2022-11-06 DIAGNOSIS — Z7901 Long term (current) use of anticoagulants: Secondary | ICD-10-CM | POA: Insufficient documentation

## 2022-11-06 DIAGNOSIS — I10 Essential (primary) hypertension: Secondary | ICD-10-CM | POA: Insufficient documentation

## 2022-11-06 DIAGNOSIS — C7951 Secondary malignant neoplasm of bone: Secondary | ICD-10-CM | POA: Insufficient documentation

## 2022-11-06 DIAGNOSIS — G546 Phantom limb syndrome with pain: Secondary | ICD-10-CM | POA: Insufficient documentation

## 2022-11-06 DIAGNOSIS — Z923 Personal history of irradiation: Secondary | ICD-10-CM | POA: Insufficient documentation

## 2022-11-06 NOTE — Telephone Encounter (Signed)
Clinical Pharmacist Practitioner Encounter   Patient has been approved for mfg assistance and will start once he has medication in hand.   Patient Education I spoke with patient for overview of new oral chemotherapy medication: Nubeqa (darolutamide) for the treatment of metastatic castration sensitive prostate cancer in conjunction with docetaxel and ADT, planned duration until disease progression or unacceptable drug toxicity.   Counseled patient on administration, dosing, side effects, monitoring, drug-food interactions, safe handling, storage, and disposal. Patient will take 2 tablet 600 mg by mouth 2 (two) times daily with a meal.  Side effects include but not limited to: fatigue, decreased wbc, changes in liver function.    Reviewed with patient importance of keeping a medication schedule and plan for any missed doses.  After discussion with patient no patient barriers to medication adherence identified.   Telly voiced understanding and appreciation. All questions answered. Medication handout provided.  Provided patient with Oral Chemotherapy Navigation Clinic phone number. Patient knows to call the office with questions or concerns. Oral Chemotherapy Navigation Clinic will continue to follow.  Remi Haggard, PharmD, BCPS, BCOP, CPP Hematology/Oncology Clinical Pharmacist Practitioner [place] [phone]  11/06/2022 3:51 PM

## 2022-11-06 NOTE — Telephone Encounter (Signed)
Oral Oncology Patient Advocate Encounter   Received notification that the application for assistance for Nubeqa through [facility] [place] [employer] has been approved.   [employer]'s phone number [phone].   Effective dates: 11/06/22 through 03/05/23  Medication will be filled at [facility].   Ardeen Fillers, CPhT Oncology Pharmacy Patient Advocate  [facility]  [phone] (phone) [phone] (fax) 11/06/2022 1:13 PM

## 2022-11-06 NOTE — Telephone Encounter (Signed)
Called and spoke to patient about approval for Patient Assistance for Nubeqa through [place] as well as having pharmacist provide drug education to patient. Patient was given phone number for [employer] [place] [employer] to call and place initial fill of medication. Patient expressed understanding and indicated they would call as soon as we were off the phone. Patient also knows to call me at [phone] with any questions or concerns receiving medication from Patient Assistance Program.    Ardeen Fillers, CPhT Oncology Pharmacy Patient Advocate  [facility]  [phone] (phone) [phone] (fax) 11/06/2022 3:53 PM

## 2022-11-07 ENCOUNTER — Ambulatory Visit: Payer: Medicare PPO

## 2022-11-07 ENCOUNTER — Ambulatory Visit
Admission: RE | Admit: 2022-11-07 | Discharge: 2022-11-07 | Disposition: A | Discharge status: Home / Self Care | Payer: Medicare PPO | Source: Ambulatory Visit | Attending: Radiation Oncology | Admitting: Radiation Oncology

## 2022-11-07 ENCOUNTER — Other Ambulatory Visit: Payer: Self-pay

## 2022-11-07 ENCOUNTER — Encounter: Payer: Self-pay | Admitting: Internal Medicine

## 2022-11-07 DIAGNOSIS — C61 Malignant neoplasm of prostate: Secondary | ICD-10-CM | POA: Diagnosis present

## 2022-11-07 DIAGNOSIS — C7951 Secondary malignant neoplasm of bone: Secondary | ICD-10-CM | POA: Diagnosis present

## 2022-11-07 LAB — RAD ONC ARIA SESSION SUMMARY
Course Elapsed Days: 16
Plan Fractions Treated to Date: 1
Plan Prescribed Dose Per Fraction: 8 Gy
Plan Total Fractions Prescribed: 1
Plan Total Prescribed Dose: 8 Gy
Reference Point Dosage Given to Date: 8 Gy
Reference Point Session Dosage Given: 8 Gy
Session Number: 11

## 2022-11-08 ENCOUNTER — Other Ambulatory Visit: Payer: Self-pay | Admitting: Internal Medicine

## 2022-11-08 ENCOUNTER — Telehealth: Payer: Self-pay | Admitting: Internal Medicine

## 2022-11-08 ENCOUNTER — Ambulatory Visit: Payer: Medicare PPO

## 2022-11-08 ENCOUNTER — Other Ambulatory Visit: Payer: Self-pay

## 2022-11-08 ENCOUNTER — Other Ambulatory Visit: Payer: Self-pay | Admitting: *Deleted

## 2022-11-08 ENCOUNTER — Telehealth: Payer: Self-pay | Admitting: *Deleted

## 2022-11-08 ENCOUNTER — Encounter: Payer: Self-pay | Admitting: *Deleted

## 2022-11-08 MED ORDER — ENOXAPARIN SODIUM 120 MG/0.8ML IJ SOSY: 120.0000 mg | PREFILLED_SYRINGE | INTRAMUSCULAR | 0 refills | Status: DC

## 2022-11-08 MED ORDER — APIXABAN 5 MG PO TABS: 5.0000 mg | ORAL_TABLET | Freq: Two times a day (BID) | ORAL | 3 refills | Status: DC

## 2022-11-08 NOTE — Telephone Encounter (Signed)
RN called and spoke with patient.  Instructed on Dr Kizzie Bane orders regarding coumadin, lovenox and eliquis medication regime.  Pt was also instructed not to take any blood thinner medication the day of the procedure. Pt verbalized understanding and wrote instructions down. Pt did express there maybe an issue with financially being able to afford medications.  RN instructed to please contact the clinic as soon as possible if unable to obtain medications.    Pt advised per Dr Sharlette Dense orders as follows:  please ask patient to stop taking the coumadin tomorrow- [last dose of coumadin on 09/06];    starting 09/07 AM- lovenox once a day shot from until 09/10 AM. Do NOT take on the day of the day [9/11] of procedure.    can start Eliquis the day after the procedure as prescribed. And no need to check blood levels while on eliquis. If patient having cost issues with ELiquis-have the patient bring to our attention ASAP.

## 2022-11-08 NOTE — Telephone Encounter (Signed)
Discussed with PCP Dr. Jeannetta Nap agreement with discontinuation Coumadin; and consideration of Eliquis for long-term anticoagulation given history of DVT/PE.   Patient agreement.  Lovenox bridge provided.

## 2022-11-08 NOTE — Progress Notes (Signed)
please ask patient to stop taking the coumadin tomorrow- [last dose of coumadin on 09/06];   starting 09/07 AM- lovenox once a day shot from until 09/10 AM. Do NOT take on the day of the day [9/11] of procedure.   can start Eliquis the day after the procedure as prescribed. And no need to check blood levels while on eliquis. If patient having cost issues with ELiquis-have the patient bring to our attention ASAP.   Prescription for Lovenox sent.;  Eliquis sent.  GB

## 2022-11-09 ENCOUNTER — Telehealth: Payer: Self-pay | Admitting: *Deleted

## 2022-11-09 ENCOUNTER — Ambulatory Visit: Payer: Medicare PPO

## 2022-11-09 NOTE — Telephone Encounter (Signed)
Verified with IR/Janet Jenne Pane, Patient does not need to hold his coumadin for his port placement. Procedure is considered low risk. Patient made aware. Informed Pharmacy~ Ardeen Fillers that we can cancel Lovenox injections.

## 2022-11-11 ENCOUNTER — Encounter: Payer: Self-pay | Admitting: Internal Medicine

## 2022-11-12 ENCOUNTER — Ambulatory Visit: Payer: Medicare PPO

## 2022-11-12 ENCOUNTER — Other Ambulatory Visit (HOSPITAL_COMMUNITY): Payer: Self-pay

## 2022-11-12 ENCOUNTER — Other Ambulatory Visit: Payer: Self-pay | Admitting: *Deleted

## 2022-11-12 ENCOUNTER — Telehealth: Payer: Self-pay | Admitting: *Deleted

## 2022-11-12 NOTE — Telephone Encounter (Signed)
Faxed copy of Eliquis prescription sent to [facility] in [place] on 11/08/22. Attached a card/coupon "Eliquis with Me" for a free 30 day trial of medication. Ardeen Fillers in pharmacy is working on patient assistance for future refills.

## 2022-11-13 ENCOUNTER — Ambulatory Visit: Payer: Medicare PPO

## 2022-11-13 ENCOUNTER — Other Ambulatory Visit (HOSPITAL_COMMUNITY): Payer: Self-pay | Admitting: Student

## 2022-11-13 NOTE — Progress Notes (Signed)
Patient for IR Port Insertion on Wed 11/14/22, I called and spoke with the patient on the phone and gave pre-procedure instructions. Pt was made aware to be here at 10:30a, NPO after MN prior to procedure as well as driver post procedure/recovery/discharge. Pt stated understanding.  Called 11/13/22  Pt is a double amputee.

## 2022-11-14 ENCOUNTER — Ambulatory Visit
Admission: RE | Admit: 2022-11-14 | Discharge: 2022-11-14 | Disposition: A | Discharge status: Home / Self Care | Payer: Medicare PPO | Source: Ambulatory Visit | Attending: Internal Medicine | Admitting: Internal Medicine

## 2022-11-14 ENCOUNTER — Other Ambulatory Visit: Payer: Self-pay

## 2022-11-14 ENCOUNTER — Encounter: Payer: Self-pay | Admitting: Radiology

## 2022-11-14 ENCOUNTER — Ambulatory Visit: Payer: Medicare PPO

## 2022-11-14 DIAGNOSIS — Z89612 Acquired absence of left leg above knee: Secondary | ICD-10-CM | POA: Insufficient documentation

## 2022-11-14 DIAGNOSIS — F32A Depression, unspecified: Secondary | ICD-10-CM | POA: Insufficient documentation

## 2022-11-14 DIAGNOSIS — I10 Essential (primary) hypertension: Secondary | ICD-10-CM | POA: Diagnosis not present

## 2022-11-14 DIAGNOSIS — C7951 Secondary malignant neoplasm of bone: Secondary | ICD-10-CM | POA: Diagnosis not present

## 2022-11-14 DIAGNOSIS — Z89611 Acquired absence of right leg above knee: Secondary | ICD-10-CM | POA: Insufficient documentation

## 2022-11-14 DIAGNOSIS — C61 Malignant neoplasm of prostate: Secondary | ICD-10-CM | POA: Diagnosis present

## 2022-11-14 HISTORY — PX: IR IMAGING GUIDED PORT INSERTION: IMG5740

## 2022-11-14 MED ORDER — SODIUM CHLORIDE 0.9 % IV SOLN: INTRAVENOUS | Status: DC

## 2022-11-14 MED ORDER — FENTANYL CITRATE (PF) 100 MCG/2ML IJ SOLN
INTRAMUSCULAR | Status: AC | PRN
Start: 2022-11-14 — End: 2022-11-14
  Administered 2022-11-14 (×3): 50 ug via INTRAVENOUS

## 2022-11-14 MED ORDER — FENTANYL CITRATE (PF) 100 MCG/2ML IJ SOLN
INTRAMUSCULAR | Status: AC
  Filled 2022-11-14: qty 2

## 2022-11-14 MED ORDER — LIDOCAINE-EPINEPHRINE 1 %-1:100000 IJ SOLN
INTRAMUSCULAR | Status: AC
  Filled 2022-11-14: qty 1

## 2022-11-14 MED ORDER — LIDOCAINE-EPINEPHRINE 1 %-1:100000 IJ SOLN
10.0000 mL | Freq: Once | INTRAMUSCULAR | Status: AC
  Administered 2022-11-14: 10 mL via INTRADERMAL

## 2022-11-14 MED ORDER — HEPARIN SOD (PORK) LOCK FLUSH 100 UNIT/ML IV SOLN
INTRAVENOUS | Status: AC
  Filled 2022-11-14: qty 5

## 2022-11-14 MED ORDER — MIDAZOLAM HCL 2 MG/2ML IJ SOLN
INTRAMUSCULAR | Status: AC
  Filled 2022-11-14: qty 2

## 2022-11-14 MED ORDER — MIDAZOLAM HCL 2 MG/2ML IJ SOLN
INTRAMUSCULAR | Status: AC | PRN
Start: 2022-11-14 — End: 2022-11-14
  Administered 2022-11-14: 1 mg via INTRAVENOUS
  Administered 2022-11-14: 2 mg via INTRAVENOUS

## 2022-11-14 MED ORDER — HEPARIN SOD (PORK) LOCK FLUSH 100 UNIT/ML IV SOLN
500.0000 [IU] | Freq: Once | INTRAVENOUS | Status: AC
  Administered 2022-11-14: 500 [IU] via INTRAVENOUS

## 2022-11-14 NOTE — H&P (Signed)
Chief Complaint: Patient was seen in consultation today for port placement  Referring Physician(s): Louretta Shorten R  Supervising Physician: Pernell Dupre  Patient Status: [facility] - Out-pt  History of Present Illness: [name] is a 67 y.o. male with PMH significant for depression, hypertension, bilateral AKA, and prostate cancer being seen today for image-guided port placement. Patient is under the care of Dr Donneta Romberg from Oncology team. Patient to undergo IV chemotherapy and port has been requested to facilitate chemotherapy.   Past Medical History:  Diagnosis Date   Depression    History of blood clots 08/13/2011   History of left knee replacement    HTN (hypertension)    Lymphedema    lt leg-per pt   PE (pulmonary thromboembolism) (HCC)    S/P AKA (above knee amputation) bilateral (HCC)    rt 11/24/07 and lt 01/11/17    Past Surgical History:  Procedure Laterality Date   ACHILLES TENDON SURGERY Left    per pt   bone clip removal Left 10/04/2015   knee   CARDIAC SURGERY  11/2012   cath.-per pt   CERVICAL FUSION  07/28/2015   per pt   EPIGASTRIC HERNIA REPAIR     per pt   hematoma removal Left 11/02/2015   knee   HIP SURGERY Right    x8 per pt   KNEE FUSION Right    KNEE JOINT MANIPULATION Left 09/11/2011   KNEE SURGERY Left    x3   leg stump Left 03/2017   leg surgery-per pt   NECK SURGERY  02/2015   plates-per pt   REPLACEMENT TOTAL KNEE Right    REPLACEMENT TOTAL KNEE Left 07/02/2011   again in 07/10/2011 per pt   s/p of bilateral AKA Right    screen filter removal  10/20/2008   for embolism per pt   screen filter replacement     x3   SHOULDER SURGERY Right    x2-per pt   SPINE SURGERY  02/11/2015   vertebra spacer-per pt   toe nail removal Right    per pt   VARICOSE VEIN SURGERY     x5-per pt    Allergies: Bacitracin, Vancomycin, Ketorolac, and Spironolactone  Medications: Prior to Admission medications   Medication Sig Start  Date End Date Taking? Authorizing Provider  apixaban (ELIQUIS) 5 MG TABS tablet Take 1 tablet (5 mg total) by mouth 2 (two) times daily. 11/08/22   Earna Coder, MD  buPROPion (WELLBUTRIN XL) 300 MG 24 hr tablet Take 300 mg by mouth daily. 07/26/22   [provider]  darolutamide (NUBEQA) 300 MG tablet Take 600 mg by mouth 2 (two) times daily with a meal.    Earna Coder, MD  dexamethasone (DECADRON) 4 MG tablet Once a day- in AM with breakfast. Patient not taking: Reported on 11/01/2022 10/04/22   Earna Coder, MD  enoxaparin (LOVENOX) 120 MG/0.8ML injection Inject 0.8 mLs (120 mg total) into the skin daily. Take in the morning.  Start taking on 09/07 until 09/10. Do not take on 9/11- on the day of the procedure. 11/10/22   Earna Coder, MD  lisinopril (ZESTRIL) 20 MG tablet Take 20 mg by mouth 2 (two) times daily. 06/29/19 11/01/22  [provider]  ondansetron (ZOFRAN) 8 MG tablet Take 1 tablet (8 mg total) by mouth every 8 (eight) hours as needed for nausea or vomiting. 10/25/22   Carmina Miller, MD  oxyCODONE-acetaminophen (PERCOCET/ROXICET) 5-325 MG tablet Take 1 tablet by mouth  every 12 (twelve) hours as needed for moderate pain. 11/01/22   Earna Coder, MD  senna-docusate (SENOKOT-S) 8.6-50 MG tablet Take 2 tablets by mouth 2 (two) times daily as needed for mild constipation. Patient not taking: Reported on 10/04/2022 10/01/22   Marrion Coy, MD  warfarin (COUMADIN) 5 MG tablet Take 1 tablet (5 mg total) by mouth daily. 10/02/22   Marrion Coy, MD     Family History  Problem Relation Age of Onset   Alzheimer's disease Mother    Lung cancer Father     Social History   Socioeconomic History   Marital status: Married    Spouse name: Not on file   Number of children: Not on file   Years of education: Not on file   Highest education level: Not on file  Occupational History   Not on file  Tobacco Use   Smoking status: Never    Smokeless tobacco: Never  Vaping Use   Vaping status: Never Used  Substance and Sexual Activity   Alcohol use: No   Drug use: Never   Sexual activity: Not on file  Other Topics Concern   Not on file  Social History Narrative   Not on file   Social Determinants of Health   Financial Resource Strain: High Risk (07/28/2021)   Received from [facility], [facility]   Overall Financial Resource Strain (CARDIA)    Difficulty of Paying Living Expenses: Very hard  Food Insecurity: No Food Insecurity (09/30/2022)   Hunger Vital Sign    Worried About Running Out of Food in the Last Year: Never true    Ran Out of Food in the Last Year: Never true  Transportation Needs: No Transportation Needs (09/30/2022)   PRAPARE - Administrator, Civil Service (Medical): No    Lack of Transportation (Non-Medical): No  Physical Activity: Sufficiently Active (07/28/2021)   Received from [facility], [facility]   Exercise Vital Sign    Days of Exercise per Week: 7 days    Minutes of Exercise per Session: 60 min  Stress: Stress Concern Present (08/01/2021)   Received from [facility], [facility]   [employer] of Occupational Health - Occupational Stress Questionnaire    Feeling of Stress : Very much  Social Connections: Socially Isolated (08/01/2021)   Received from [facility], [facility]   Social Connection and Isolation Panel [NHANES]    Frequency of Communication with Friends and Family: Once a week    Frequency of Social Gatherings with Friends and Family: Never    Attends Religious Services: Never    Database administrator or Organizations: Yes    Attends Banker Meetings: Never    Marital Status: Separated    Code Status: Full code  Review of Systems: A 12 point ROS discussed and pertinent positives are indicated in the  HPI above.  All other systems are negative.  Review of Systems  Constitutional:  Negative for chills and fever.  Respiratory:  Negative for chest tightness and shortness of breath.   Cardiovascular:  Negative for chest pain.  Gastrointestinal:  Negative for abdominal pain, diarrhea, nausea and vomiting.  Neurological:  Negative for dizziness and headaches.  Psychiatric/Behavioral:  Negative for confusion.     Vital Signs: BP (!) 130/100   Pulse 67   Temp 97.8 F (36.6 C) (Oral)   Resp 16  SpO2 96%     Physical Exam Vitals reviewed.  Constitutional:      General: He is not in acute distress.    Appearance: He is not ill-appearing.  Cardiovascular:     Rate and Rhythm: Normal rate and regular rhythm.     Pulses: Normal pulses.     Heart sounds: Normal heart sounds.  Pulmonary:     Effort: Pulmonary effort is normal.     Breath sounds: Normal breath sounds.  Abdominal:     Palpations: Abdomen is soft.     Tenderness: There is no abdominal tenderness.  Musculoskeletal:     Comments: Patient with bilateral AKAs   Skin:    General: Skin is warm and dry.  Neurological:     Mental Status: He is alert and oriented to person, place, and time.  Psychiatric:        Mood and Affect: Mood normal.        Behavior: Behavior normal.        Judgment: Judgment normal.     Imaging: No results found.  Labs:  CBC: Recent Labs    09/30/22 0921 10/01/22 0501 10/25/22 1358 11/01/22 1044  WBC 6.4 7.2 4.7 4.0  HGB 12.8* 12.1* 14.2 13.6  HCT 38.9* 36.9* 42.9 41.3  PLT 214 212 235 194    COAGS: Recent Labs    09/30/22 1237 10/01/22 0501  INR 2.8* 2.9*  APTT 46*  --     BMP: Recent Labs    09/30/22 0921 10/01/22 0501 11/01/22 1043  NA 136 136 137  K 3.8 3.5 3.9  CL 106 106 108  CO2 23 22 23   GLUCOSE 92 108* 80  BUN 8 8 18   CALCIUM 8.5* 8.6* 9.4  CREATININE 0.89 0.87 0.78  GFRNONAA >60 >60 >60    LIVER FUNCTION TESTS: Recent Labs    09/30/22 0921  11/01/22 1043  BILITOT 1.6* 1.2  AST 26 18  ALT 10 14  ALKPHOS 653* 1,137*  PROT 7.0 7.5  ALBUMIN 3.4* 4.2    TUMOR MARKERS: No results for input(s): "AFPTM", "CEA", "CA199", "CHROMGRNA" in the last 8760 hours.  Assessment and Plan:  [name] is a 67 yo male with metastatic prostate cancer being seen today for image-guided port placement. Patient has been under the care of Dr Donneta Romberg from Oncology team who has requested port placement to facilitate chemotherapy. Case has been reviewed with Dr Juliette Alcide and is scheduled to proceed on 11/14/22. Patient presents today in his usual state of health and is NPO.  Risks and benefits of image guided port-a-catheter placement was discussed with the patient including, but not limited to bleeding, infection, pneumothorax, or fibrin sheath development and need for additional procedures.  All of the patient's questions were answered, patient is agreeable to proceed. Consent signed and in chart.   Thank you for this interesting consult.  I greatly enjoyed meeting [name] and look forward to participating in their care.  A copy of this report was sent to the requesting provider on this date.  Electronically Signed: Kennieth Francois, PA-C 11/14/2022, 10:11 AM   I spent a total of  15 Minutes   in face to face in clinical consultation, greater than 50% of which was counseling/coordinating care for port placement

## 2022-11-14 NOTE — Procedures (Signed)
Interventional Radiology Procedure Note  Date of Procedure: 11/14/2022  Procedure: Port placement   Findings:  1. Right chest port placement via right internal jugular.    Complications: No immediate complications noted.   Estimated Blood Loss: minimal  Follow-up and Recommendations: 1. Port is ready for use    Olive Bass, MD  Vascular & Interventional Radiology  11/14/2022 1:07 PM

## 2022-11-15 ENCOUNTER — Ambulatory Visit: Payer: Medicare PPO | Admitting: Radiation Oncology

## 2022-11-19 ENCOUNTER — Ambulatory Visit
Admission: RE | Admit: 2022-11-19 | Discharge: 2022-11-19 | Disposition: A | Discharge status: Home / Self Care | Payer: Medicare PPO | Source: Ambulatory Visit | Attending: Radiation Oncology | Admitting: Radiation Oncology

## 2022-11-19 ENCOUNTER — Encounter: Payer: Self-pay | Admitting: Radiation Oncology

## 2022-11-19 VITALS — BP 146/79 | HR 71 | Temp 98.4°F | Resp 18

## 2022-11-19 DIAGNOSIS — C61 Malignant neoplasm of prostate: Secondary | ICD-10-CM | POA: Diagnosis not present

## 2022-11-19 NOTE — Progress Notes (Signed)
Radiation Oncology Follow up Note  OPNA pelvic mass  Name: [name]   Date:   11/19/2022 MRN:  [id number] DOB: 02-25-1956    This 67 y.o. male presents to the clinic today for reevaluation for possible radiation therapy to his pelvis and patient with castrate sensitive stage IV prostate cancer recently completed treatment to his scapula with good result.  REFERRING PROVIDER: Dione Housekeeper, *  HPI: Patient is a 67 year old male now out 2 weeks having completed palliative radiation therapy.  To his scapula.  He has had excellent palliation of pain in that area still complains of lower back pain.  We originally plan to treat his pelvis SI joint region and that is his major area of pain at this time.  He is widespread significant bone metastasis.  PSMA PET scan shows marked activity in the SI joint region and lower lumbar spine.  He does have widespread metastatic disease.  COMPLICATIONS OF TREATMENT: none  FOLLOW UP COMPLIANCE: keeps appointments   PHYSICAL EXAM:  BP (!) 146/79   Pulse 71   Temp 98.4 F (36.9 C)   Resp 18  D palpation of the spine does not elicit pain.  Patient is status post bilateral AKA's.  Well-developed well-nourished patient in NAD. HEENT reveals PERLA, EOMI, discs not visualized.  Oral cavity is clear. No oral mucosal lesions are identified. Neck is clear without evidence of cervical or supraclavicular adenopathy. Lungs are clear to A&P. Cardiac examination is essentially unremarkable with regular rate and rhythm without murmur rub or thrill. Abdomen is benign with no organomegaly or masses noted. Motor sensory and DTR levels are equal and symmetric in the upper   extremities. Cranial nerves II through XII are grossly intact. Proprioception is intact. No peripheral adenopathy or edema is identified. No motor or sensory levels are noted. Crude visual fields are within normal range.  RADIOLOGY RESULTS: PSMA PET scan reviewed  PLAN: This time elect go ahead  with palliative radiation therapy to his SI joint region in his lower lumbar spine.  Will plan on delivering 30 Gray in 10 fractions.  Risks and benefits of treatment clued possible diarrhea fatigue alteration of blood counts all reviewed in detail with the patient.  I personally set up and ordered CT simulation for later this week.  I would like to take this opportunity to thank you for allowing me to participate in the care of your patient.Carmina Miller, MD

## 2022-11-20 ENCOUNTER — Ambulatory Visit: Payer: Medicare PPO

## 2022-11-20 NOTE — Progress Notes (Signed)
Pharmacist Chemotherapy Monitoring - Initial Assessment    Anticipated start date: 11/29/22   The following has been reviewed per standard work regarding the patient's treatment regimen: The patient's diagnosis, treatment plan and drug doses, and organ/hematologic function Lab orders and baseline tests specific to treatment regimen  The treatment plan start date, drug sequencing, and pre-medications Prior authorization status  Patient's documented medication list, including drug-drug interaction screen and prescriptions for anti-emetics and supportive care specific to the treatment regimen The drug concentrations, fluid compatibility, administration routes, and timing of the medications to be used The patient's access for treatment and lifetime cumulative dose history, if applicable  The patient's medication allergies and previous infusion related reactions, if applicable   Changes made to treatment plan:  N/A  Follow up needed:  prescriptions needed for anti-emetics   Ebony Hail, Pharm.D., CPP 11/20/2022@10 :19 PM

## 2022-11-22 ENCOUNTER — Telehealth: Payer: Self-pay | Admitting: *Deleted

## 2022-11-22 ENCOUNTER — Other Ambulatory Visit: Payer: Self-pay | Admitting: *Deleted

## 2022-11-22 ENCOUNTER — Encounter: Payer: Self-pay | Admitting: Hospice and Palliative Medicine

## 2022-11-22 ENCOUNTER — Other Ambulatory Visit: Payer: Self-pay

## 2022-11-22 DIAGNOSIS — Z658 Other specified problems related to psychosocial circumstances: Secondary | ICD-10-CM

## 2022-11-22 DIAGNOSIS — C7951 Secondary malignant neoplasm of bone: Secondary | ICD-10-CM

## 2022-11-22 NOTE — Telephone Encounter (Signed)
Called patient to discuss further treatment plans. Will discuss plan with Care team and call patient.

## 2022-11-22 NOTE — Telephone Encounter (Signed)
Patient called reporting that he is having sever pain in his left shoulder arm and wrist to the pint that he can hardly raise his arm even has difficulty rasing his forearm. He said that he is to get radiation therapy to his hips and is wandering if instead of his hips, he could get his left arm radiated as he uses is more to get around. Please advise and or call him back.

## 2022-11-22 NOTE — Addendum Note (Signed)
Addended by: Keitha Butte on: 11/22/2022 01:07 PM   Modules accepted: Orders

## 2022-11-22 NOTE — Telephone Encounter (Signed)
Patient offered apt mychart today, pt declines. Would like to do mychart tomorrow morning to discuss pain mgmt. Apt arranged for 1030 am with Sharia Reeve, NP. He is not taking any narcotics, but has a script for percocet at home that Dr. B prescribed. He has never taken any percocet because he is "scared" to take it. "I live by myself and I'm concerned about falls risk." Patient admits to hopelessness/being "Lonely". He is socially isolated besides having his dog. He is an bilateral lower extremity amputee and w/c bound. He lives by himself uses public transportation for all of his apts. Patient endorses the need to talk to someone about this feelings and requested social worker consult. - referral for social worker entered per pt's request.  He is currently lifting weights indoors to help improve the upper extremity strength. He is concerned that he may have a worsening bone met in his arms. "I am now concerned that if I lift too much weight on my arms that something my just break. I have an apt with radiation on Tuesday. If Sharia Reeve wants any additional xrays of my arms, I can not get the xrays until Tuesday."   Medications reconciled w/pt prior to tomorrow's mychart visit. Pt thanked me for calling him.

## 2022-11-22 NOTE — Telephone Encounter (Signed)
Duplicate

## 2022-11-23 ENCOUNTER — Encounter: Payer: Self-pay | Admitting: Internal Medicine

## 2022-11-23 ENCOUNTER — Inpatient Hospital Stay (HOSPITAL_BASED_OUTPATIENT_CLINIC_OR_DEPARTMENT_OTHER): Payer: Medicare PPO | Admitting: Hospice and Palliative Medicine

## 2022-11-23 DIAGNOSIS — Z515 Encounter for palliative care: Secondary | ICD-10-CM | POA: Diagnosis not present

## 2022-11-23 DIAGNOSIS — C61 Malignant neoplasm of prostate: Secondary | ICD-10-CM

## 2022-11-23 DIAGNOSIS — G893 Neoplasm related pain (acute) (chronic): Secondary | ICD-10-CM

## 2022-11-23 DIAGNOSIS — C7951 Secondary malignant neoplasm of bone: Secondary | ICD-10-CM

## 2022-11-23 NOTE — Progress Notes (Signed)
Virtual Visit via Video Note  I connected with [name] on 11/23/22 at 10:30 AM EDT by a video enabled telemedicine application and verified that I am speaking with the correct person using two identifiers.  Location: Patient: Home Provider: Clinic   I discussed the limitations of evaluation and management by telemedicine and the availability of in person appointments. The patient expressed understanding and agreed to proceed.  History of Present Illness: [name] is a 67 year old male with multiple medical problems including stage IV prostate cancer widely metastatic to bone.   Observations/Objective: Had a video visit with patient today to address pain.  Patient describes generalized musculoskeletal pain.  He works out daily using an Museum/gallery exhibitions officer but he has found that difficult due to pain.  He is not currently utilizing pain medication, although he has Percocet prescribed.  Recommended trial of Percocet and discussed his concerns with opioid pain medications.  Patient lives at home, alone with his Sherwood Gambler.  He is divorced and has children but they do not live nearby.  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  He specializes in adaptive sports.  He previously was involved in competitive rowing.  Patient describes loneliness and isolation given his cancer diagnosis.  Additionally, patient has had limited ability to leave the home for several years as he is a bilateral amputee.  He was previously receiving Meals on Wheels.  Patient described his goal of maintaining purpose and being able to help others.  Assessment and Plan: Stage IV prostate cancer -pending XRT and chemotherapy  Neoplasm related pain -recommended trial of Percocet  Depressive symptoms -patient endorses loneliness and isolation.  Referral to social work and [employer].  He is on Wellbutrin XL.   Follow Up  Instructions: Follow-up telephone visit 2 to 3 weeks   I discussed the assessment and treatment plan with the patient. The patient was provided an opportunity to ask questions and all were answered. The patient agreed with the plan and demonstrated an understanding of the instructions.   The patient was advised to call back or seek an in-person evaluation if the symptoms worsen or if the condition fails to improve as anticipated.  I provided 30 minutes of non-face-to-face time during this encounter.   Malachy Moan, NP

## 2022-11-23 NOTE — Telephone Encounter (Signed)
Patient has appointment with Radiation Onc. 11/27/22 for CT simulation of affected area.

## 2022-11-26 ENCOUNTER — Other Ambulatory Visit: Payer: Self-pay | Admitting: Radiation Oncology

## 2022-11-26 DIAGNOSIS — C7951 Secondary malignant neoplasm of bone: Secondary | ICD-10-CM

## 2022-11-27 ENCOUNTER — Other Ambulatory Visit: Payer: Self-pay

## 2022-11-27 ENCOUNTER — Ambulatory Visit
Admission: RE | Admit: 2022-11-27 | Discharge: 2022-11-27 | Disposition: A | Discharge status: Home / Self Care | Payer: Medicare PPO | Source: Ambulatory Visit | Attending: Radiation Oncology | Admitting: Radiation Oncology

## 2022-11-27 DIAGNOSIS — C61 Malignant neoplasm of prostate: Secondary | ICD-10-CM

## 2022-11-27 DIAGNOSIS — C7951 Secondary malignant neoplasm of bone: Secondary | ICD-10-CM

## 2022-11-27 MED ORDER — LIDOCAINE-PRILOCAINE 2.5-2.5 % EX CREA
1.0000 | TOPICAL_CREAM | CUTANEOUS | 0 refills | Status: DC | PRN
Start: 2022-11-27 — End: 2023-07-10

## 2022-11-28 ENCOUNTER — Other Ambulatory Visit: Payer: Self-pay

## 2022-11-28 ENCOUNTER — Ambulatory Visit
Admission: RE | Admit: 2022-11-28 | Discharge: 2022-11-28 | Disposition: A | Discharge status: Home / Self Care | Payer: Medicare PPO | Source: Ambulatory Visit | Attending: Radiation Oncology | Admitting: Radiation Oncology

## 2022-11-28 DIAGNOSIS — C61 Malignant neoplasm of prostate: Secondary | ICD-10-CM | POA: Diagnosis not present

## 2022-11-28 LAB — RAD ONC ARIA SESSION SUMMARY
Course Elapsed Days: 37
Plan Fractions Treated to Date: 1
Plan Prescribed Dose Per Fraction: 6 Gy
Plan Total Fractions Prescribed: 5
Plan Total Prescribed Dose: 30 Gy
Reference Point Dosage Given to Date: 6 Gy
Reference Point Session Dosage Given: 6 Gy
Session Number: 12

## 2022-11-28 MED FILL — Dexamethasone Sodium Phosphate Inj 100 MG/10ML: INTRAMUSCULAR | Qty: 1 | Status: AC

## 2022-11-28 NOTE — Assessment & Plan Note (Signed)
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  On the thecal sac. Pre treatment : PSA 700. AUG 9th, 2024- PSMA PET - Multifocal intense radiotracer activity within the prostate gland consistent with primary prostate adenocarcinoma; Evidence of nodal metastasis with radiotracer avid tiny lymph nodes in the rectal sheath and periaortic retroperitoneum; Extensive radiotracer avid skeletal metastasis with near confluent lesions within the pelvis. Lesions in the lumbar spine, RIGHT scapula, LEFT humerus. Lesions too numerous to count. No evidence of visceral metastasis or pulmonary metastasis; awaiting guardant testing. On ADT- [started aug 1st, 2024].   # I again  reviewed the natural history/stage of patient's prostate cancer in detail.  Discussed that unfortunately patient's cancer cannot be cured.  All treatments are palliative and not curative. Discussed that the median life expectancy in the order of 3 to 5 years.   # I reviewed the recent data from ARASENS/PEACE-1 clinical trial in which patients with high risk/de novo castrate sensitive metastatic prostate cancer-treated with triple therapy [ADT + Taxotere+ Darolutamide or Abiraterone]-showed PFS/overall survival benefit compared to doublet ADT plus Taxotere.  However, I discussed my concerns about patient's tolerance to chemotherapy given his bilateral AKA/limited mobility/phantom pain etc. however patient is interested in proceeding with aggressive therapies.  # Discussed the mechanism of action of Firmagon- testosterone deprivation; understands treatments are palliative not curative.  Discussed the potential side effects including but not limited to hot flashes fatigue.   # Back pain pain medication: Patient currently not taking oxycodone.  Has a prescription.  Dexamethasone 4 mg prescription.  S/p RT-  Dr. Aggie Cosier.   # Hx of PE-CAD; bil AKA-  on coumadin.   # Chemotherapy education/nutrition evaluation: port placement. Plan start chemotherapy Sent the scripts for antiemetics-Zofran and Compazine; EMLA cream sent to pharmacy  # DISPOSITION: # refer to Josh  # refer to Chemo education ASAP- re: chemo # refer to IR re: port placement [on coumadin will need to coordinate with PCP] # Deborra Medina tomorrow # follow up in 4 weeks- MD; labs- cbc/cmp;PSA; vit D25-OH;  taxotere; Firmagon-Dr.B  # I reviewed the blood work- with the patient in detail; also reviewed the imaging independently [as summarized above]; and with the patient in detail.

## 2022-11-28 NOTE — Progress Notes (Unsigned)
[facility] OFFICE PROGRESS NOTE  Patient Care Team: Zada Finders Joycie Peek, MD as PCP - General (Family Medicine) Earna Coder, MD as Consulting Physician (Oncology) Carmina Miller, MD as Consulting Physician (Radiation Oncology)   Cancer Staging  Prostate cancer metastatic to bone [facility]) Staging form: Prostate, AJCC 8th Edition - Clinical: Stage IVB (cTX, cNX, pM1b, PSA: 703) - Signed by Earna Coder, MD on 10/04/2022 Prostate specific antigen (PSA) range: 20 or greater    Oncology History Overview Note  #HIGH RISK/ DE-NOVO CASTRATE SENSITIVE PROSTATE CANCER: CT CAP [JULY 2024]-multiple bone metastases; MRI lumbar spine without contrast  [PCP]-L4-L5 lesion.  On the thecal sac.  PSA 700.   Check guardant testing. NO Biopsy. AUG 9th, 2024- PSMA PET - Multifocal intense radiotracer activity within the prostate gland consistent with primary prostate adenocarcinoma; Evidence of nodal metastasis with radiotracer avid tiny lymph nodes in the rectal sheath and periaortic retroperitoneum; Extensive radiotracer avid skeletal metastasis with near confluent lesions within the pelvis. Lesions in the lumbar spine, RIGHT scapula, LEFT humerus. Lesions too numerous to count. No evidence of visceral metastasis or pulmonary metastasis; awaiting guardant testing. On ADT- [started aug 1st, 2024].   # AUG 1st, 2024- [facility]agon  --------------------- # CAD; on coumadin; Bil AKA    Prostate cancer metastatic to bone (HCC)  09/30/2022 Initial Diagnosis   Prostate cancer metastatic to bone (HCC)   10/04/2022 Cancer Staging   Staging form: Prostate, AJCC 8th Edition - Clinical: Stage IVB (cTX, cNX, pM1b, PSA: 703) - Signed by Earna Coder, MD on 10/04/2022 Prostate specific antigen (PSA) range: 20 or greater   11/29/2022 -  Chemotherapy   Patient is on Treatment Plan : PROSTATE Docetaxel (75) q21d        HISTORY OF PRESENT ILLNESS: Patient ambulating-with wheel chair/AKA.   Patient is alone.  [name] 67 y.o.  male pleasant patient with a medical history significant of s/p of bilateral AKA, PE on Coumadin, HTN, depression,is here to discuss his treatment options for castrate sensitive metastatic prostate cancer/and reviewed the results of the PET scan.  Patient is currently undergoing radiation.  Patient states he has ongoing pain in his lower back.  However is not taking any tramadol as recommended.  Denies any bladder or bowel incontinence.  Continues to have shooting pain in his legs stumps bilaterally.  Review of Systems  Constitutional:  Positive for malaise/fatigue. Negative for chills, diaphoresis, fever and weight loss.  HENT:  Negative for nosebleeds and sore throat.   Eyes:  Negative for double vision.  Respiratory:  Negative for cough, hemoptysis, sputum production, shortness of breath and wheezing.   Cardiovascular:  Positive for leg swelling. Negative for chest pain, palpitations and orthopnea.  Gastrointestinal:  Negative for abdominal pain, blood in stool, constipation, diarrhea, heartburn, melena, nausea and vomiting.  Genitourinary:  Negative for dysuria, frequency and urgency.  Musculoskeletal:  Positive for back pain, joint pain and myalgias.  Skin: Negative.  Negative for itching and rash.  Neurological:  Negative for dizziness, tingling, focal weakness, weakness and headaches.  Endo/Heme/Allergies:  Does not bruise/bleed easily.  Psychiatric/Behavioral:  Negative for depression. The patient is not nervous/anxious and does not have insomnia.       PAST MEDICAL HISTORY :  Past Medical History:  Diagnosis Date  . Depression   . History of blood clots 08/13/2011  . History of left knee replacement   . HTN (hypertension)   . Lymphedema    lt leg-per pt  .  PE (pulmonary thromboembolism) (HCC)   . S/P AKA (above knee amputation) bilateral (HCC)    rt 11/24/07 and lt 01/11/17    PAST SURGICAL HISTORY :   Past Surgical History:   Procedure Laterality Date  . ACHILLES TENDON SURGERY Left    per pt  . bone clip removal Left 10/04/2015   knee  . CARDIAC SURGERY  11/2012   cath.-per pt  . CERVICAL FUSION  07/28/2015   per pt  . EPIGASTRIC HERNIA REPAIR     per pt  . hematoma removal Left 11/02/2015   knee  . HIP SURGERY Right    x8 per pt  . IR IMAGING GUIDED PORT INSERTION  11/14/2022  . KNEE FUSION Right   . KNEE JOINT MANIPULATION Left 09/11/2011  . KNEE SURGERY Left    x3  . leg stump Left 03/2017   leg surgery-per pt  . NECK SURGERY  02/2015   plates-per pt  . REPLACEMENT TOTAL KNEE Right   . REPLACEMENT TOTAL KNEE Left 07/02/2011   again in 07/10/2011 per pt  . s/p of bilateral AKA Right   . screen filter removal  10/20/2008   for embolism per pt  . screen filter replacement     x3  . SHOULDER SURGERY Right    x2-per pt  . SPINE SURGERY  02/11/2015   vertebra spacer-per pt  . toe nail removal Right    per pt  . VARICOSE VEIN SURGERY     x5-per pt    FAMILY HISTORY :   Family History  Problem Relation Age of Onset  . Alzheimer's disease Mother   . Lung cancer Father     SOCIAL HISTORY:   Social History   Tobacco Use  . Smoking status: Never  . Smokeless tobacco: Former  Advertising account planner  . Vaping status: Never Used  Substance Use Topics  . Alcohol use: Yes    Alcohol/week: 3.0 standard drinks of alcohol    Types: 3 Glasses of wine per week  . Drug use: Never    ALLERGIES:  is allergic to bacitracin, vancomycin, ketorolac, and spironolactone.  MEDICATIONS:  Current Outpatient Medications  Medication Sig Dispense Refill  . apixaban (ELIQUIS) 5 MG TABS tablet Take 1 tablet (5 mg total) by mouth 2 (two) times daily. 60 tablet 3  . buPROPion (WELLBUTRIN XL) 300 MG 24 hr tablet Take 300 mg by mouth daily.    . darolutamide (NUBEQA) 300 MG tablet Take 600 mg by mouth 2 (two) times daily with a meal.    . dexamethasone (DECADRON) 4 MG tablet Once a day- in AM with breakfast. (Patient  not taking: Reported on 11/01/2022) 30 tablet 0  . lidocaine-prilocaine (EMLA) cream Apply 1 Application topically as needed. 30 g 0  . lisinopril (ZESTRIL) 20 MG tablet Take 20 mg by mouth 2 (two) times daily.    . ondansetron (ZOFRAN) 8 MG tablet Take 1 tablet (8 mg total) by mouth every 8 (eight) hours as needed for nausea or vomiting. (Patient not taking: Reported on 11/22/2022) 20 tablet 0  . oxyCODONE-acetaminophen (PERCOCET/ROXICET) 5-325 MG tablet Take 1 tablet by mouth every 12 (twelve) hours as needed for moderate pain. (Patient not taking: Reported on 11/22/2022) 60 tablet 0  . senna-docusate (SENOKOT-S) 8.6-50 MG tablet Take 2 tablets by mouth 2 (two) times daily as needed for mild constipation. (Patient not taking: Reported on 10/04/2022) 100 tablet 0   No current facility-administered medications for this visit.    PHYSICAL  EXAMINATION:   There were no vitals taken for this visit.  There were no vitals filed for this visit.   Patient has bilateral AKA.   Physical Exam Vitals and nursing note reviewed.  HENT:     Head: Normocephalic and atraumatic.     Mouth/Throat:     Pharynx: Oropharynx is clear.  Eyes:     Extraocular Movements: Extraocular movements intact.     Pupils: Pupils are equal, round, and reactive to light.  Cardiovascular:     Rate and Rhythm: Normal rate and regular rhythm.  Pulmonary:     Comments: Decreased breath sounds bilaterally.  Abdominal:     Palpations: Abdomen is soft.  Musculoskeletal:        General: Normal range of motion.     Cervical back: Normal range of motion.  Skin:    General: Skin is warm.  Neurological:     General: No focal deficit present.     Mental Status: He is alert and oriented to person, place, and time.  Psychiatric:        Behavior: Behavior normal.        Judgment: Judgment normal.     LABORATORY DATA:  I have reviewed the data as listed    Component Value Date/Time   NA 137 11/01/2022 1043   K 3.9  11/01/2022 1043   CL 108 11/01/2022 1043   CO2 23 11/01/2022 1043   GLUCOSE 80 11/01/2022 1043   BUN 18 11/01/2022 1043   CREATININE 0.78 11/01/2022 1043   CALCIUM 9.4 11/01/2022 1043   PROT 7.5 11/01/2022 1043   ALBUMIN 4.2 11/01/2022 1043   AST 18 11/01/2022 1043   ALT 14 11/01/2022 1043   ALKPHOS 1,137 (H) 11/01/2022 1043   BILITOT 1.2 11/01/2022 1043   GFRNONAA >60 11/01/2022 1043   GFRAA >60 10/19/2016 1335    No results found for: "SPEP", "UPEP"  Lab Results  Component Value Date   WBC 4.0 11/01/2022   NEUTROABS 2.6 11/01/2022   HGB 13.6 11/01/2022   HCT 41.3 11/01/2022   MCV 91.0 11/01/2022   PLT 194 11/01/2022      Chemistry      Component Value Date/Time   NA 137 11/01/2022 1043   K 3.9 11/01/2022 1043   CL 108 11/01/2022 1043   CO2 23 11/01/2022 1043   BUN 18 11/01/2022 1043   CREATININE 0.78 11/01/2022 1043      Component Value Date/Time   CALCIUM 9.4 11/01/2022 1043   ALKPHOS 1,137 (H) 11/01/2022 1043   AST 18 11/01/2022 1043   ALT 14 11/01/2022 1043   BILITOT 1.2 11/01/2022 1043       RADIOGRAPHIC STUDIES: I have personally reviewed the radiological images as listed and agreed with the findings in the report. No results found.   ASSESSMENT & PLAN:  No problem-specific Assessment & Plan notes found for this encounter.   No orders of the defined types were placed in this encounter.  All questions were answered. The patient knows to call the clinic with any problems, questions or concerns.      Earna Coder, MD 11/28/2022 7:26 PM

## 2022-11-29 ENCOUNTER — Inpatient Hospital Stay (HOSPITAL_BASED_OUTPATIENT_CLINIC_OR_DEPARTMENT_OTHER): Payer: Medicare PPO | Admitting: Internal Medicine

## 2022-11-29 ENCOUNTER — Other Ambulatory Visit: Payer: Self-pay

## 2022-11-29 ENCOUNTER — Inpatient Hospital Stay: Payer: Medicare PPO

## 2022-11-29 ENCOUNTER — Ambulatory Visit
Admission: RE | Admit: 2022-11-29 | Discharge: 2022-11-29 | Disposition: A | Discharge status: Home / Self Care | Payer: Medicare PPO | Source: Ambulatory Visit | Attending: Radiation Oncology | Admitting: Radiation Oncology

## 2022-11-29 VITALS — BP 136/79 | HR 68 | Temp 98.6°F | Ht <= 58 in

## 2022-11-29 VITALS — BP 112/68 | HR 75 | Resp 18 | Ht <= 58 in | Wt 288.0 lb

## 2022-11-29 DIAGNOSIS — C61 Malignant neoplasm of prostate: Secondary | ICD-10-CM

## 2022-11-29 DIAGNOSIS — C7951 Secondary malignant neoplasm of bone: Secondary | ICD-10-CM

## 2022-11-29 LAB — CBC WITH DIFFERENTIAL (CANCER CENTER ONLY)
Abs Immature Granulocytes: 0.01 10*3/uL (ref 0.00–0.07)
Basophils Absolute: 0.1 10*3/uL (ref 0.0–0.1)
Basophils Relative: 1 %
Eosinophils Absolute: 0.2 10*3/uL (ref 0.0–0.5)
Eosinophils Relative: 5 %
HCT: 36.2 % — ABNORMAL LOW (ref 39.0–52.0)
Hemoglobin: 12.2 g/dL — ABNORMAL LOW (ref 13.0–17.0)
Immature Granulocytes: 0 %
Lymphocytes Relative: 12 %
Lymphs Abs: 0.5 10*3/uL — ABNORMAL LOW (ref 0.7–4.0)
MCH: 29.7 pg (ref 26.0–34.0)
MCHC: 33.7 g/dL (ref 30.0–36.0)
MCV: 88.1 fL (ref 80.0–100.0)
Monocytes Absolute: 0.5 10*3/uL (ref 0.1–1.0)
Monocytes Relative: 13 %
Neutro Abs: 2.8 10*3/uL (ref 1.7–7.7)
Neutrophils Relative %: 69 %
Platelet Count: 164 10*3/uL (ref 150–400)
RBC: 4.11 MIL/uL — ABNORMAL LOW (ref 4.22–5.81)
RDW: 13.2 % (ref 11.5–15.5)
WBC Count: 4 10*3/uL (ref 4.0–10.5)
nRBC: 0 % (ref 0.0–0.2)

## 2022-11-29 LAB — RAD ONC ARIA SESSION SUMMARY
Course Elapsed Days: 38
Plan Fractions Treated to Date: 2
Plan Prescribed Dose Per Fraction: 6 Gy
Plan Total Fractions Prescribed: 5
Plan Total Prescribed Dose: 30 Gy
Reference Point Dosage Given to Date: 12 Gy
Reference Point Session Dosage Given: 6 Gy
Session Number: 13

## 2022-11-29 LAB — CMP (CANCER CENTER ONLY)
ALT: 12 U/L (ref 0–44)
AST: 15 U/L (ref 15–41)
Albumin: 4 g/dL (ref 3.5–5.0)
Alkaline Phosphatase: 620 U/L — ABNORMAL HIGH (ref 38–126)
Anion gap: 7 (ref 5–15)
BUN: 19 mg/dL (ref 8–23)
CO2: 19 mmol/L — ABNORMAL LOW (ref 22–32)
Calcium: 9.2 mg/dL (ref 8.9–10.3)
Chloride: 110 mmol/L (ref 98–111)
Creatinine: 0.68 mg/dL (ref 0.61–1.24)
GFR, Estimated: >60 mL/min (ref >60)
Glucose, Bld: 91 mg/dL (ref 70–99)
Potassium: 4.1 mmol/L (ref 3.5–5.1)
Sodium: 136 mmol/L (ref 135–145)
Total Bilirubin: 1.9 mg/dL — ABNORMAL HIGH (ref 0.3–1.2)
Total Protein: 7.1 g/dL (ref 6.5–8.1)

## 2022-11-29 LAB — VITAMIN D 25 HYDROXY (VIT D DEFICIENCY, FRACTURES): Vit D, 25-Hydroxy: 33.77 ng/mL (ref 30–100)

## 2022-11-29 LAB — PSA: Prostatic Specific Antigen: 72.95 ng/mL — ABNORMAL HIGH (ref 0.00–4.00)

## 2022-11-29 MED ORDER — ONDANSETRON HCL 8 MG PO TABS: 8.0000 mg | ORAL_TABLET | Freq: Three times a day (TID) | ORAL | 1 refills | Status: DC | PRN

## 2022-11-29 MED ORDER — SODIUM CHLORIDE 0.9 % IV SOLN
10.0000 mg | Freq: Once | INTRAVENOUS | Status: AC
  Administered 2022-11-29: 10 mg via INTRAVENOUS
  Filled 2022-11-29: qty 10

## 2022-11-29 MED ORDER — SODIUM CHLORIDE 0.9 % IV SOLN
Freq: Once | INTRAVENOUS | Status: AC
  Filled 2022-11-29: qty 250

## 2022-11-29 MED ORDER — DEGARELIX ACETATE 80 MG ~~LOC~~ SOLR
80.0000 mg | Freq: Once | SUBCUTANEOUS | Status: AC
  Administered 2022-11-29: 80 mg via SUBCUTANEOUS
  Filled 2022-11-29: qty 4

## 2022-11-29 MED ORDER — HEPARIN SOD (PORK) LOCK FLUSH 100 UNIT/ML IV SOLN
500.0000 [IU] | Freq: Once | INTRAVENOUS | Status: AC | PRN
  Administered 2022-11-29: 500 [IU]
  Filled 2022-11-29: qty 5

## 2022-11-29 MED ORDER — SODIUM CHLORIDE 0.9 % IV SOLN
60.0000 mg/m2 | Freq: Once | INTRAVENOUS | Status: AC
  Administered 2022-11-29: 139 mg via INTRAVENOUS
  Filled 2022-11-29: qty 13.9

## 2022-11-29 NOTE — Patient Instructions (Signed)
[place] [facility]  Discharge Instructions: Thank you for choosing [facility] to provide your oncology and hematology care.  If you have a lab appointment with the Cancer Center, please go directly to the Cancer Center and check in at the registration area.  Wear comfortable clothing and clothing appropriate for easy access to any Portacath or PICC line.   We strive to give you quality time with your provider. You may need to reschedule your appointment if you arrive late (15 or more minutes).  Arriving late affects you and other patients whose appointments are after yours.  Also, if you miss three or more appointments without notifying the office, you may be dismissed from the clinic at the provider's discretion.      For prescription refill requests, have your pharmacy contact our office and allow 72 hours for refills to be completed.    Today you received the following chemotherapy and/or immunotherapy agents TAXTOERE and FIRMAGON      To help prevent nausea and vomiting after your treatment, we encourage you to take your nausea medication as directed.  BELOW ARE SYMPTOMS THAT SHOULD BE REPORTED IMMEDIATELY: *FEVER GREATER THAN 100.4 F (38 C) OR HIGHER *CHILLS OR SWEATING *NAUSEA AND VOMITING THAT IS NOT CONTROLLED WITH YOUR NAUSEA MEDICATION *UNUSUAL SHORTNESS OF BREATH *UNUSUAL BRUISING OR BLEEDING *URINARY PROBLEMS (pain or burning when urinating, or frequent urination) *BOWEL PROBLEMS (unusual diarrhea, constipation, pain near the anus) TENDERNESS IN MOUTH AND THROAT WITH OR WITHOUT PRESENCE OF ULCERS (sore throat, sores in mouth, or a toothache) UNUSUAL RASH, SWELLING OR PAIN  UNUSUAL VAGINAL DISCHARGE OR ITCHING   Items with * indicate a potential emergency and should be followed up as soon as possible or go to the Emergency Department if any problems should occur.  Please show the CHEMOTHERAPY ALERT CARD or IMMUNOTHERAPY ALERT CARD at  check-in to the Emergency Department and triage nurse.  Should you have questions after your visit or need to cancel or reschedule your appointment, please contact [place] [facility]  [phone] and follow the prompts.  Office hours are 8:00 a.m. to 4:30 p.m. Monday - Friday. Please note that voicemails left after 4:00 p.m. may not be returned until the following business day.  We are closed weekends and major holidays. You have access to a nurse at all times for urgent questions. Please call the main number to the clinic [phone] and follow the prompts.  For any non-urgent questions, you may also contact your provider using MyChart. We now offer e-Visits for anyone 4 and older to request care online for non-urgent symptoms. For details visit [email or web address].   Also download the MyChart app! Go to the app store, search "MyChart", open the app, select [facility], and log in with your MyChart username and password.   Docetaxel Injection What is this medication? DOCETAXEL (doe se TAX el) treats some types of cancer. It works by slowing down the growth of cancer cells. This medicine may be used for other purposes; ask your health care provider or pharmacist if you have questions. COMMON BRAND NAME(S): Docefrez, Docivyx, Taxotere What should I tell my care team before I take this medication? They need to know if you have any of these conditions: Kidney disease Liver disease Low white blood cell levels Tingling of the fingers or toes or other nerve disorder An unusual or allergic reaction to docetaxel, polysorbate 80, other medications, foods, dyes, or preservatives Pregnant or trying  to get pregnant Breast-feeding How should I use this medication? This medication is injected into a vein. It is given by your care team in a hospital or clinic setting. Talk to your care team about the use of this medication in children. Special care may be  needed. Overdosage: If you think you have taken too much of this medicine contact a poison control center or emergency room at once. NOTE: This medicine is only for you. Do not share this medicine with others. What if I miss a dose? Keep appointments for follow-up doses. It is important not to miss your dose. Call your care team if you are unable to keep an appointment. What may interact with this medication? Do not take this medication with any of the following: Live virus vaccines This medication may also interact with the following: Certain antibiotics, such as clarithromycin, telithromycin Certain antivirals for HIV or hepatitis Certain medications for fungal infections, such as itraconazole, ketoconazole, voriconazole Grapefruit juice Nefazodone Supplements, such as St. John's wort This list may not describe all possible interactions. Give your health care provider a list of all the medicines, herbs, non-prescription drugs, or dietary supplements you use. Also tell them if you smoke, drink alcohol, or use illegal drugs. Some items may interact with your medicine. What should I watch for while using this medication? This medication may make you feel generally unwell. This is not uncommon as chemotherapy can affect healthy cells as well as cancer cells. Report any side effects. Continue your course of treatment even though you feel ill unless your care team tells you to stop. You may need blood work done while you are taking this medication. This medication can cause serious side effects and infusion reactions. To reduce the risk, your care team may give you other medications to take before receiving this one. Be sure to follow the directions from your care team. This medication may increase your risk of getting an infection. Call your care team for advice if you get a fever, chills, sore throat, or other symptoms of a cold or flu. Do not treat yourself. Try to avoid being around people who  are sick. Avoid taking medications that contain aspirin, acetaminophen, ibuprofen, naproxen, or ketoprofen unless instructed by your care team. These medications may hide a fever. Be careful brushing or flossing your teeth or using a toothpick because you may get an infection or bleed more easily. If you have any dental work done, tell your dentist you are receiving this medication. Some products may contain alcohol. Ask your care team if this medication contains alcohol. Be sure to tell all care teams you are taking this medicine. Certain medications, like metronidazole and disulfiram, can cause an unpleasant reaction when taken with alcohol. The reaction includes flushing, headache, nausea, vomiting, sweating, and increased thirst. The reaction can last from 30 minutes to several hours. This medication may affect your coordination, reaction time, or judgement. Do not drive or operate machinery until you know how this medication affects you. Sit up or stand slowly to reduce the risk of dizzy or fainting spells. Drinking alcohol with this medication can increase the risk of these side effects. Talk to your care team about your risk of cancer. You may be more at risk for certain types of cancer if you take this medication. Talk to your care team if you wish to become pregnant or think you might be pregnant. This medication can cause serious birth defects if taken during pregnancy or if you get  pregnant within 2 months after stopping therapy. A negative pregnancy test is required before starting this medication. A reliable form of contraception is recommended while taking this medication and for 2 months after stopping it. Talk to your care team about reliable forms of contraception. Do not breast-feed while taking this medication and for 1 week after stopping therapy. Use a condom during sex and for 4 months after stopping therapy. Tell your care team right away if you think your partner might be pregnant.  This medication can cause serious birth defects. This medication may cause infertility. Talk to your care team if you are concerned about your fertility. What side effects may I notice from receiving this medication? Side effects that you should report to your care team as soon as possible: Allergic reactions--skin rash, itching, hives, swelling of the face, lips, tongue, or throat Change in vision such as blurry vision, seeing halos around lights, vision loss Infection--fever, chills, cough, or sore throat Infusion reactions--chest pain, shortness of breath or trouble breathing, feeling faint or lightheaded Low red blood cell level--unusual weakness or fatigue, dizziness, headache, trouble breathing Pain, tingling, or numbness in the hands or feet Painful swelling, warmth, or redness of the skin, blisters or sores at the infusion site Redness, blistering, peeling, or loosening of the skin, including inside the mouth Sudden or severe stomach pain, bloody diarrhea, fever, nausea, vomiting Swelling of the ankles, hands, or feet Tumor lysis syndrome (TLS)--nausea, vomiting, diarrhea, decrease in the amount of urine, dark urine, unusual weakness or fatigue, confusion, muscle pain or cramps, fast or irregular heartbeat, joint pain Unusual bruising or bleeding Side effects that usually do not require medical attention (report to your care team if they continue or are bothersome): Change in nail shape, thickness, or color Change in taste Hair loss Increased tears This list may not describe all possible side effects. Call your doctor for medical advice about side effects. You may report side effects to FDA at 1-800-FDA-1088. Where should I keep my medication? This medication is given in a hospital or clinic. It will not be stored at home. NOTE: This sheet is a summary. It may not cover all possible information. If you have questions about this medicine, talk to your doctor, pharmacist, or health care  provider.  2024 Elsevier/Gold Standard (2021-04-27 00:00:00)  Degarelix Injection What is this medication? DEGARELIX (deg a REL ix) treats prostate cancer. It works by decreasing levels of the hormone testosterone in the body. This prevents prostate cancer cells from spreading or growing. It belongs to a group of medications called GnRH blockers. This medicine may be used for other purposes; ask your health care provider or pharmacist if you have questions. COMMON BRAND NAME(S): Degarelix, Deborra Medina What should I tell my care team before I take this medication? They need to know if you have any of these conditions: Diabetes Heart disease Kidney disease Liver disease Low levels of potassium or magnesium in the blood Osteoporosis, weak bones An unusual or allergic reaction to degarelix, mannitol, other medications, foods, dyes, or preservatives If you or your partner are pregnant or trying to get pregnant How should I use this medication? This medication is injected under the skin. It is usually given by your care team in a hospital or clinic setting. It may also be given at home. If you get this medication at home, you will be taught how to prepare and give it. Use exactly as directed. Take it as directed on the prescription label at the same  time every day. Keep taking it unless your care team tells you to stop. It is important that you put your used needles and syringes in a special sharps container. Do not put them in a trash can. If you do not have a sharps container, call your pharmacist or care team to get one. Talk to your care team about the use of this medication in children. Special care may be needed. Overdosage: If you think you have taken too much of this medicine contact a poison control center or emergency room at once. NOTE: This medicine is only for you. Do not share this medicine with others. What if I miss a dose? If you get this medication at the hospital or clinic: It is  important not to miss your dose. Call your care team if you are unable to keep an appointment. If you give yourself this medication at home: If you miss a dose, take it as soon as you can. If it is almost time for your next dose, take only that dose. Do not take double or extra doses. Call your care team with questions. What may interact with this medication? Do not take this medication with any of the following: Cisapride Dronedarone Pimozide Thioridazine This medication may also interact with the following: Other medications that cause heart rhythm changes This list may not describe all possible interactions. Give your health care provider a list of all the medicines, herbs, non-prescription drugs, or dietary supplements you use. Also tell them if you smoke, drink alcohol, or use illegal drugs. Some items may interact with your medicine. What should I watch for while using this medication? Your condition will be monitored carefully while you are receiving this medication. You may need blood work while taking this medication. Do not rub or scratch injection site. There may be a lump at the injection site, or it may be red or sore for a few days after your dose. This medication may cause infertility. Talk to your care team if you are concerned about your fertility. What side effects may I notice from receiving this medication? Side effects that you should report to your care team as soon as possible: Allergic reactions or angioedema--skin rash, itching or hives, swelling of the face, eyes, lips, tongue, arms, or legs, trouble swallowing or breathing Heart rhythm changes--fast or irregular heartbeat, dizziness, feeling faint or lightheaded, chest pain, trouble breathing Side effects that usually do not require medical attention (report to your care team if they continue or are bothersome): Hot flashes Pain, redness, or irritation at injection site Weight gain This list may not describe all  possible side effects. Call your doctor for medical advice about side effects. You may report side effects to FDA at 1-800-FDA-1088. Where should I keep my medication? Keep out of the reach of children and pets. This medication is usually given in a hospital or clinic and will not be stored at home. In rare cases, this medication may be given at home. If you are using this medication at home, you will be instructed on how to store this medication. Get rid of any unused medication after the expiration date. To get rid of medications that are no longer needed or have expired: Take the medication to a medication take-back program. Check with your pharmacy or law enforcement to find a location. If you cannot return the medication, ask your pharmacist or care team how to get rid of this medication safely. NOTE: This sheet is a summary. It may  not cover all possible information. If you have questions about this medicine, talk to your doctor, pharmacist, or health care provider.  2024 Elsevier/Gold Standard (2021-07-13 00:00:00)

## 2022-11-29 NOTE — Progress Notes (Signed)
Refill zofran, pending. Having some nausea.  C/o of back and right shoulder pain 7/10.  Does have trouble sleeping, no sleep aid.

## 2022-11-30 ENCOUNTER — Ambulatory Visit: Payer: Medicare PPO

## 2022-11-30 ENCOUNTER — Other Ambulatory Visit: Payer: Medicare PPO

## 2022-11-30 ENCOUNTER — Ambulatory Visit: Payer: Medicare PPO | Admitting: Internal Medicine

## 2022-11-30 ENCOUNTER — Other Ambulatory Visit: Payer: Self-pay

## 2022-12-01 ENCOUNTER — Other Ambulatory Visit: Payer: Self-pay

## 2022-12-03 ENCOUNTER — Other Ambulatory Visit: Payer: Self-pay

## 2022-12-03 ENCOUNTER — Ambulatory Visit
Admission: RE | Admit: 2022-12-03 | Discharge: 2022-12-03 | Disposition: A | Discharge status: Home / Self Care | Payer: Medicare PPO | Source: Ambulatory Visit | Attending: Radiation Oncology | Admitting: Radiation Oncology

## 2022-12-03 ENCOUNTER — Encounter: Payer: Self-pay | Admitting: Hospice and Palliative Medicine

## 2022-12-03 ENCOUNTER — Inpatient Hospital Stay (HOSPITAL_BASED_OUTPATIENT_CLINIC_OR_DEPARTMENT_OTHER): Payer: Medicare PPO | Admitting: Hospice and Palliative Medicine

## 2022-12-03 VITALS — BP 98/61 | HR 86 | Temp 99.0°F

## 2022-12-03 DIAGNOSIS — C7951 Secondary malignant neoplasm of bone: Secondary | ICD-10-CM | POA: Diagnosis not present

## 2022-12-03 DIAGNOSIS — L0390 Cellulitis, unspecified: Secondary | ICD-10-CM | POA: Diagnosis not present

## 2022-12-03 DIAGNOSIS — G893 Neoplasm related pain (acute) (chronic): Secondary | ICD-10-CM | POA: Diagnosis not present

## 2022-12-03 DIAGNOSIS — C61 Malignant neoplasm of prostate: Secondary | ICD-10-CM

## 2022-12-03 LAB — RAD ONC ARIA SESSION SUMMARY
Course Elapsed Days: 42
Plan Fractions Treated to Date: 3
Plan Prescribed Dose Per Fraction: 6 Gy
Plan Total Fractions Prescribed: 5
Plan Total Prescribed Dose: 30 Gy
Reference Point Dosage Given to Date: 18 Gy
Reference Point Session Dosage Given: 6 Gy
Session Number: 14

## 2022-12-03 MED ORDER — GABAPENTIN 100 MG PO CAPS: 100.0000 mg | ORAL_CAPSULE | Freq: Three times a day (TID) | ORAL | 0 refills | Status: DC

## 2022-12-03 MED ORDER — CEPHALEXIN 500 MG PO CAPS: 500.0000 mg | ORAL_CAPSULE | Freq: Four times a day (QID) | ORAL | 0 refills | Status: DC

## 2022-12-03 NOTE — Progress Notes (Signed)
Symptom Management Clinic [facility] at [facility] Telephone:(336) [phone] Fax:(336) [phone]  Patient Care Team: Dione Housekeeper, MD as PCP - General (Family Medicine) Earna Coder, MD as Consulting Physician (Oncology) Carmina Miller, MD as Consulting Physician (Radiation Oncology)   NAME OF PATIENT: [name]  [id number]  1955-08-14   DATE OF VISIT: 12/03/22  REASON FOR CONSULT: [name] is a 67 y.o. 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   INTERVAL HISTORY: Patient was an add-on to clinic schedule today with complaint of redness and pain at the site of his lower abdomen where he received Firmagon injection on 11/29/2022.  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  However, he says pain has been worse since he started chemotherapy.  He has not been taking his Percocet.  Denies recent fevers or illnesses. Denies any easy bleeding or bruising. Reports good appetite and denies weight loss. Denies chest pain. Denies any nausea, vomiting, constipation, or diarrhea. Denies urinary complaints. Patient offers no further specific complaints today.   PAST MEDICAL HISTORY: Past Medical History:  Diagnosis Date   Depression    History of blood clots 08/13/2011   History of left knee replacement    HTN (hypertension)    Lymphedema    lt leg-per pt   PE (pulmonary thromboembolism) (HCC)    S/P AKA (above knee amputation) bilateral (HCC)    rt 11/24/07 and lt 01/11/17    PAST SURGICAL HISTORY:  Past Surgical History:  Procedure Laterality Date   ACHILLES TENDON SURGERY Left    per pt   bone clip removal Left 10/04/2015   knee   CARDIAC SURGERY  11/2012   cath.-per pt   CERVICAL FUSION  07/28/2015   per pt   EPIGASTRIC HERNIA REPAIR     per pt   hematoma removal Left 11/02/2015   knee   HIP SURGERY Right    x8 per pt   IR IMAGING  GUIDED PORT INSERTION  11/14/2022   KNEE FUSION Right    KNEE JOINT MANIPULATION Left 09/11/2011   KNEE SURGERY Left    x3   leg stump Left 03/2017   leg surgery-per pt   NECK SURGERY  02/2015   plates-per pt   REPLACEMENT TOTAL KNEE Right    REPLACEMENT TOTAL KNEE Left 07/02/2011   again in 07/10/2011 per pt   s/p of bilateral AKA Right    screen filter removal  10/20/2008   for embolism per pt   screen filter replacement     x3   SHOULDER SURGERY Right    x2-per pt   SPINE SURGERY  02/11/2015   vertebra spacer-per pt   toe nail removal Right    per pt   VARICOSE VEIN SURGERY     x5-per pt    HEMATOLOGY/ONCOLOGY HISTORY:  Oncology History Overview Note  #HIGH RISK/ DE-NOVO CASTRATE SENSITIVE PROSTATE CANCER: CT CAP [JULY 2024]-multiple bone metastases; MRI lumbar spine without contrast  [PCP]-L4-L5 lesion.  On the thecal sac.  PSA 700.   Check guardant testing. NO Biopsy. AUG 9th, 2024- PSMA PET - Multifocal intense radiotracer activity within the prostate gland consistent with primary prostate adenocarcinoma; Evidence of nodal metastasis with radiotracer avid tiny lymph nodes in the rectal sheath and periaortic retroperitoneum; Extensive radiotracer avid skeletal metastasis with near confluent lesions within the pelvis. Lesions in the lumbar spine, RIGHT scapula, LEFT humerus. Lesions too numerous to count.  No evidence of visceral metastasis or pulmonary metastasis; awaiting guardant testing. On ADT- [started aug 1st, 2024].   # AUG 1st, 2024- Firmagon  --------------------- # CAD; on coumadin; Bil AKA    Prostate cancer metastatic to bone (HCC)  09/30/2022 Initial Diagnosis   Prostate cancer metastatic to bone (HCC)   10/04/2022 Cancer Staging   Staging form: Prostate, AJCC 8th Edition - Clinical: Stage IVB (cTX, cNX, pM1b, PSA: 703) - Signed by Earna Coder, MD on 10/04/2022 Prostate specific antigen (PSA) range: 20 or greater   11/29/2022 -  Chemotherapy   Patient  is on Treatment Plan : PROSTATE Docetaxel (75) q21d       ALLERGIES:  is allergic to bacitracin, vancomycin, ketorolac, and spironolactone.  MEDICATIONS:  Current Outpatient Medications  Medication Sig Dispense Refill   apixaban (ELIQUIS) 5 MG TABS tablet Take 1 tablet (5 mg total) by mouth 2 (two) times daily. 60 tablet 3   buPROPion (WELLBUTRIN XL) 300 MG 24 hr tablet Take 300 mg by mouth daily.     darolutamide (NUBEQA) 300 MG tablet Take 600 mg by mouth 2 (two) times daily with a meal.     lidocaine-prilocaine (EMLA) cream Apply 1 Application topically as needed. 30 g 0   lisinopril (ZESTRIL) 20 MG tablet Take 20 mg by mouth 2 (two) times daily.     ondansetron (ZOFRAN) 8 MG tablet Take 1 tablet (8 mg total) by mouth every 8 (eight) hours as needed for nausea or vomiting. 60 tablet 1   dexamethasone (DECADRON) 4 MG tablet Once a day- in AM with breakfast. (Patient not taking: Reported on 11/01/2022) 30 tablet 0   oxyCODONE-acetaminophen (PERCOCET/ROXICET) 5-325 MG tablet Take 1 tablet by mouth every 12 (twelve) hours as needed for moderate pain. (Patient not taking: Reported on 11/22/2022) 60 tablet 0   senna-docusate (SENOKOT-S) 8.6-50 MG tablet Take 2 tablets by mouth 2 (two) times daily as needed for mild constipation. (Patient not taking: Reported on 10/04/2022) 100 tablet 0   No current facility-administered medications for this visit.    VITAL SIGNS: BP 98/61 Comment: repeat bp  Pulse 86   Temp 99 F (37.2 C) (Oral)   SpO2 96%  There were no vitals filed for this visit.  Estimated body mass index is 60.19 kg/m as calculated from the following:   Height as of 11/29/22: 4\' 10"  (1.473 m).   Weight as of 11/29/22: 288 lb (130.6 kg).  LABS: CBC:    Component Value Date/Time   WBC 4.0 11/29/2022 0822   WBC 7.2 10/01/2022 0501   HGB 12.2 (L) 11/29/2022 0822   HCT 36.2 (L) 11/29/2022 0822   PLT 164 11/29/2022 0822   MCV 88.1 11/29/2022 0822   NEUTROABS 2.8 11/29/2022 0822    LYMPHSABS 0.5 (L) 11/29/2022 0822   MONOABS 0.5 11/29/2022 0822   EOSABS 0.2 11/29/2022 0822   BASOSABS 0.1 11/29/2022 0822   Comprehensive Metabolic Panel:    Component Value Date/Time   NA 136 11/29/2022 0822   K 4.1 11/29/2022 0822   CL 110 11/29/2022 0822   CO2 19 (L) 11/29/2022 0822   BUN 19 11/29/2022 0822   CREATININE 0.68 11/29/2022 0822   GLUCOSE 91 11/29/2022 0822   CALCIUM 9.2 11/29/2022 0822   AST 15 11/29/2022 0822   ALT 12 11/29/2022 0822   ALKPHOS 620 (H) 11/29/2022 0822   BILITOT 1.9 (H) 11/29/2022 0822   PROT 7.1 11/29/2022 0822   ALBUMIN 4.0 11/29/2022 0822    RADIOGRAPHIC  STUDIES: IR IMAGING GUIDED PORT INSERTION  Result Date: 11/14/2022 INDICATION: Chemotherapy access EXAM: Chest port placement using ultrasound and fluoroscopic guidance MEDICATIONS: Documented in the EMR ANESTHESIA/SEDATION: Moderate (conscious) sedation was employed during this procedure. A total of Versed 3 mg and Fentanyl 150 mcg was administered intravenously. Moderate Sedation Time: 37 minutes. The patient's level of consciousness and vital signs were monitored continuously by radiology nursing throughout the procedure under my direct supervision. FLUOROSCOPY TIME:  Fluoroscopy Time: 2.7 minutes (40 mGy) COMPLICATIONS: None immediate. PROCEDURE: Informed written consent was obtained from the patient after a thorough discussion of the procedural risks, benefits and alternatives. All questions were addressed. Maximal Sterile Barrier Technique was utilized including caps, mask, sterile gowns, sterile gloves, sterile drape, hand hygiene and skin antiseptic. A timeout was performed prior to the initiation of the procedure. The patient was placed supine on the exam table. The right neck and chest was prepped and draped in the standard sterile fashion. A preliminary ultrasound of the right neck was performed and demonstrates a patent right internal jugular vein. A permanent ultrasound image was stored in  the electronic medical record. The overlying skin was anesthetized with 1% Lidocaine. Using ultrasound guidance, access was obtained into the right internal jugular vein using a 21 gauge micropuncture set. A wire was advanced into the SVC, a short incision was made at the puncture site, and serial dilatation performed. Next, in an ipsilateral infraclavicular location, an incision was made at the site of the subcutaneous reservoir. Blunt dissection was used to open a pocket to contain the reservoir. A subcutaneous tunnel was then created from the port site to the puncture site. A(n) 8 Fr single lumen catheter was advanced through the tunnel. The catheter was attached to the port and this was placed in the subcutaneous pocket. Under fluoroscopic guidance, a peel away sheath was placed, and the catheter was trimmed to the appropriate length and was advanced into the central veins. The catheter length is 24 cm. The tip of the catheter lies near the superior cavoatrial junction. The port flushes and aspirates appropriately. The port was flushed and locked with heparinized saline. The port pocket was closed in 2 layers using 3-0 and 4-0 Vicryl/absorbable suture. Dermabond was also applied to both incisions. The patient tolerated the procedure well and was transferred to recovery in stable condition. IMPRESSION: Successful placement of a right-sided chest port via the right internal jugular vein. The port is ready for immediate use. Electronically Signed   By: Olive Bass M.D.   On: 11/14/2022 13:07    PERFORMANCE STATUS (ECOG) : 2 - Symptomatic, <50% confined to bed  Review of Systems Unless otherwise noted, a complete review of systems is negative.  Physical Exam General: NAD Cardiovascular: regular rate and rhythm Pulmonary: clear ant fields Abdomen: soft, nontender, + bowel sounds GU: no suprapubic tenderness Extremities: Bilateral lower extremity amputations Skin: Circular area of erythema warm to  touch right lower quadrant Neurological: Weakness but otherwise nonfocal  IMPRESSION/PLAN: Stage IV prostate cancer -on docetaxel and Firmagon  Abdominal erythema -at site of Firmagon injection.  Red and warm to touch.  Discussed with Dr. Donneta Romberg will start patient on cephalexin to cover for cellulitis.  Patient to monitor for progression in symptoms.  Phantom limb pain -start gabapentin 100 mg 3 times daily     Patient expressed understanding and was in agreement with this plan. He also understands that He can call clinic at any time with any questions, concerns, or complaints.  Thank you for allowing me to participate in the care of this very pleasant patient.   Time Total: 15 minutes  Visit consisted of counseling and education dealing with the complex and emotionally intense issues of symptom management in the setting of serious illness.Greater than 50%  of this time was spent counseling and coordinating care related to the above assessment and plan.  Signed by: Laurette Schimke, PhD, NP-C

## 2022-12-04 ENCOUNTER — Ambulatory Visit: Payer: Medicare PPO

## 2022-12-04 ENCOUNTER — Ambulatory Visit
Admission: RE | Admit: 2022-12-04 | Discharge: 2022-12-04 | Disposition: A | Discharge status: Home / Self Care | Payer: Medicare PPO | Source: Ambulatory Visit | Attending: Radiation Oncology | Admitting: Radiation Oncology

## 2022-12-04 ENCOUNTER — Other Ambulatory Visit: Payer: Self-pay

## 2022-12-04 DIAGNOSIS — C7951 Secondary malignant neoplasm of bone: Secondary | ICD-10-CM | POA: Diagnosis present

## 2022-12-04 DIAGNOSIS — C61 Malignant neoplasm of prostate: Secondary | ICD-10-CM | POA: Insufficient documentation

## 2022-12-04 LAB — RAD ONC ARIA SESSION SUMMARY
Course Elapsed Days: 43
Plan Fractions Treated to Date: 4
Plan Prescribed Dose Per Fraction: 6 Gy
Plan Total Fractions Prescribed: 5
Plan Total Prescribed Dose: 30 Gy
Reference Point Dosage Given to Date: 24 Gy
Reference Point Session Dosage Given: 6 Gy
Session Number: 15

## 2022-12-05 ENCOUNTER — Other Ambulatory Visit: Payer: Self-pay

## 2022-12-05 ENCOUNTER — Ambulatory Visit
Admission: RE | Admit: 2022-12-05 | Discharge: 2022-12-05 | Disposition: A | Discharge status: Home / Self Care | Payer: Medicare PPO | Source: Ambulatory Visit | Attending: Radiation Oncology | Admitting: Radiation Oncology

## 2022-12-05 ENCOUNTER — Telehealth: Payer: Self-pay | Admitting: *Deleted

## 2022-12-05 DIAGNOSIS — C61 Malignant neoplasm of prostate: Secondary | ICD-10-CM | POA: Diagnosis not present

## 2022-12-05 LAB — RAD ONC ARIA SESSION SUMMARY
Course Elapsed Days: 44
Plan Fractions Treated to Date: 5
Plan Prescribed Dose Per Fraction: 6 Gy
Plan Total Fractions Prescribed: 5
Plan Total Prescribed Dose: 30 Gy
Reference Point Dosage Given to Date: 30 Gy
Reference Point Session Dosage Given: 6 Gy
Session Number: 16

## 2022-12-05 NOTE — Telephone Encounter (Signed)
Returned call to patient and stated that Carollee Herter in Rad Onc was notified that he could be here today for 4pm appointment.

## 2022-12-06 ENCOUNTER — Other Ambulatory Visit: Payer: Self-pay | Admitting: Internal Medicine

## 2022-12-06 ENCOUNTER — Encounter: Payer: Self-pay | Admitting: Nurse Practitioner

## 2022-12-06 ENCOUNTER — Inpatient Hospital Stay: Payer: Medicare PPO | Admitting: Nurse Practitioner

## 2022-12-06 ENCOUNTER — Other Ambulatory Visit: Payer: Self-pay

## 2022-12-06 ENCOUNTER — Inpatient Hospital Stay: Payer: Medicare PPO

## 2022-12-06 ENCOUNTER — Inpatient Hospital Stay: Payer: Medicare PPO | Attending: Internal Medicine

## 2022-12-06 VITALS — BP 95/61 | HR 70 | Temp 97.0°F | Resp 20

## 2022-12-06 DIAGNOSIS — Z923 Personal history of irradiation: Secondary | ICD-10-CM | POA: Insufficient documentation

## 2022-12-06 DIAGNOSIS — M549 Dorsalgia, unspecified: Secondary | ICD-10-CM | POA: Diagnosis not present

## 2022-12-06 DIAGNOSIS — Z5111 Encounter for antineoplastic chemotherapy: Secondary | ICD-10-CM | POA: Insufficient documentation

## 2022-12-06 DIAGNOSIS — Z5189 Encounter for other specified aftercare: Secondary | ICD-10-CM | POA: Diagnosis not present

## 2022-12-06 DIAGNOSIS — Z89612 Acquired absence of left leg above knee: Secondary | ICD-10-CM | POA: Insufficient documentation

## 2022-12-06 DIAGNOSIS — Z7901 Long term (current) use of anticoagulants: Secondary | ICD-10-CM | POA: Insufficient documentation

## 2022-12-06 DIAGNOSIS — I2510 Atherosclerotic heart disease of native coronary artery without angina pectoris: Secondary | ICD-10-CM | POA: Diagnosis not present

## 2022-12-06 DIAGNOSIS — D709 Neutropenia, unspecified: Secondary | ICD-10-CM | POA: Insufficient documentation

## 2022-12-06 DIAGNOSIS — R1310 Dysphagia, unspecified: Secondary | ICD-10-CM | POA: Insufficient documentation

## 2022-12-06 DIAGNOSIS — R5383 Other fatigue: Secondary | ICD-10-CM | POA: Insufficient documentation

## 2022-12-06 DIAGNOSIS — M7989 Other specified soft tissue disorders: Secondary | ICD-10-CM | POA: Diagnosis not present

## 2022-12-06 DIAGNOSIS — G546 Phantom limb syndrome with pain: Secondary | ICD-10-CM | POA: Diagnosis not present

## 2022-12-06 DIAGNOSIS — G893 Neoplasm related pain (acute) (chronic): Secondary | ICD-10-CM

## 2022-12-06 DIAGNOSIS — C61 Malignant neoplasm of prostate: Secondary | ICD-10-CM

## 2022-12-06 DIAGNOSIS — G4700 Insomnia, unspecified: Secondary | ICD-10-CM | POA: Diagnosis not present

## 2022-12-06 DIAGNOSIS — Z86718 Personal history of other venous thrombosis and embolism: Secondary | ICD-10-CM | POA: Insufficient documentation

## 2022-12-06 DIAGNOSIS — Z801 Family history of malignant neoplasm of trachea, bronchus and lung: Secondary | ICD-10-CM | POA: Insufficient documentation

## 2022-12-06 DIAGNOSIS — Z79899 Other long term (current) drug therapy: Secondary | ICD-10-CM | POA: Insufficient documentation

## 2022-12-06 DIAGNOSIS — E860 Dehydration: Secondary | ICD-10-CM | POA: Insufficient documentation

## 2022-12-06 DIAGNOSIS — C7951 Secondary malignant neoplasm of bone: Secondary | ICD-10-CM | POA: Diagnosis not present

## 2022-12-06 DIAGNOSIS — Z89611 Acquired absence of right leg above knee: Secondary | ICD-10-CM | POA: Diagnosis not present

## 2022-12-06 DIAGNOSIS — L0390 Cellulitis, unspecified: Secondary | ICD-10-CM

## 2022-12-06 DIAGNOSIS — Z95828 Presence of other vascular implants and grafts: Secondary | ICD-10-CM

## 2022-12-06 LAB — CBC WITH DIFFERENTIAL (CANCER CENTER ONLY)
Abs Immature Granulocytes: 0.02 10*3/uL (ref 0.00–0.07)
Basophils Absolute: 0 10*3/uL (ref 0.0–0.1)
Basophils Relative: 2 %
Eosinophils Absolute: 0 10*3/uL (ref 0.0–0.5)
Eosinophils Relative: 0 %
HCT: 31 % — ABNORMAL LOW (ref 39.0–52.0)
Hemoglobin: 10.6 g/dL — ABNORMAL LOW (ref 13.0–17.0)
Immature Granulocytes: 2 %
Lymphocytes Relative: 22 %
Lymphs Abs: 0.3 10*3/uL — ABNORMAL LOW (ref 0.7–4.0)
MCH: 30.2 pg (ref 26.0–34.0)
MCHC: 34.2 g/dL (ref 30.0–36.0)
MCV: 88.3 fL (ref 80.0–100.0)
Monocytes Absolute: 0.2 10*3/uL (ref 0.1–1.0)
Monocytes Relative: 13 %
Neutro Abs: 0.7 10*3/uL — ABNORMAL LOW (ref 1.7–7.7)
Neutrophils Relative %: 61 %
Platelet Count: 194 10*3/uL (ref 150–400)
RBC: 3.51 MIL/uL — ABNORMAL LOW (ref 4.22–5.81)
RDW: 12.7 % (ref 11.5–15.5)
WBC Count: 1.2 10*3/uL — ABNORMAL LOW (ref 4.0–10.5)
nRBC: 0 % (ref 0.0–0.2)

## 2022-12-06 LAB — BASIC METABOLIC PANEL
Anion gap: 6 (ref 5–15)
BUN: 18 mg/dL (ref 8–23)
CO2: 21 mmol/L — ABNORMAL LOW (ref 22–32)
Calcium: 9 mg/dL (ref 8.9–10.3)
Chloride: 105 mmol/L (ref 98–111)
Creatinine, Ser: 0.9 mg/dL (ref 0.61–1.24)
GFR, Estimated: >60 mL/min (ref >60)
Glucose, Bld: 107 mg/dL — ABNORMAL HIGH (ref 70–99)
Potassium: 4.1 mmol/L (ref 3.5–5.1)
Sodium: 132 mmol/L — ABNORMAL LOW (ref 135–145)

## 2022-12-06 MED ORDER — SODIUM CHLORIDE 0.9% FLUSH
10.0000 mL | Freq: Once | INTRAVENOUS | Status: AC
  Administered 2022-12-06: 10 mL via INTRAVENOUS
  Filled 2022-12-06: qty 10

## 2022-12-06 MED ORDER — SODIUM CHLORIDE 0.9 % IV SOLN
INTRAVENOUS | Status: DC
  Filled 2022-12-06 (×2): qty 250

## 2022-12-06 MED ORDER — HEPARIN SOD (PORK) LOCK FLUSH 100 UNIT/ML IV SOLN
500.0000 [IU] | Freq: Once | INTRAVENOUS | Status: AC
  Administered 2022-12-06: 500 [IU]
  Filled 2022-12-06: qty 5

## 2022-12-06 NOTE — Progress Notes (Signed)
Neutropenia; added undenyca-

## 2022-12-06 NOTE — Progress Notes (Signed)
Pt is Neutropenic; recent cellulitis- on Abx- start undeyca- with cycle #2.

## 2022-12-06 NOTE — Progress Notes (Signed)
Symptom Management Clinic [facility] at [facility] Telephone:(336) [phone] Fax:(336) [phone]  Patient Care Team: Dione Housekeeper, MD as PCP - General (Family Medicine) Earna Coder, MD as Consulting Physician (Oncology) Carmina Miller, MD as Consulting Physician (Radiation Oncology)   NAME OF PATIENT: [name]  [id number]  1955-10-09   DATE OF VISIT: 12/06/22  REASON FOR CONSULT: [name] is a 67 y.o. 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   INTERVAL HISTORY: Patient had cycle 1 docetaxel on 11/29/2022.  Also received [place].  Was subsequently seen in [facility] for pain and erythema surrounding injection site of Firmagon and was started on cephalexin.  Has follow-up in clinic today for labs/supportive care.  Overall, he says he feels improved.  Denies any symptomatic complaints or concerns today.  Says that his area of redness on abdomen is improving.  Tolerating antibiotics so far.  Overall, pain is improved/stable.  Denies recent fevers or illnesses. Denies any easy bleeding or bruising. Reports good appetite and denies weight loss. Denies chest pain. Denies any nausea, vomiting, constipation, or diarrhea. Denies urinary complaints. Patient offers no further specific complaints today.   PAST MEDICAL HISTORY: Past Medical History:  Diagnosis Date   Depression    History of blood clots 08/13/2011   History of left knee replacement    HTN (hypertension)    Lymphedema    lt leg-per pt   PE (pulmonary thromboembolism) (HCC)    S/P AKA (above knee amputation) bilateral (HCC)    rt 11/24/07 and lt 01/11/17    PAST SURGICAL HISTORY:  Past Surgical History:  Procedure Laterality Date   ACHILLES TENDON SURGERY Left    per pt   bone clip removal Left 10/04/2015   knee   CARDIAC SURGERY  11/2012   cath.-per pt   CERVICAL FUSION  07/28/2015   per pt   EPIGASTRIC HERNIA REPAIR      per pt   hematoma removal Left 11/02/2015   knee   HIP SURGERY Right    x8 per pt   IR IMAGING GUIDED PORT INSERTION  11/14/2022   KNEE FUSION Right    KNEE JOINT MANIPULATION Left 09/11/2011   KNEE SURGERY Left    x3   leg stump Left 03/2017   leg surgery-per pt   NECK SURGERY  02/2015   plates-per pt   REPLACEMENT TOTAL KNEE Right    REPLACEMENT TOTAL KNEE Left 07/02/2011   again in 07/10/2011 per pt   s/p of bilateral AKA Right    screen filter removal  10/20/2008   for embolism per pt   screen filter replacement     x3   SHOULDER SURGERY Right    x2-per pt   SPINE SURGERY  02/11/2015   vertebra spacer-per pt   toe nail removal Right    per pt   VARICOSE VEIN SURGERY     x5-per pt    HEMATOLOGY/ONCOLOGY HISTORY:  Oncology History Overview Note  #HIGH RISK/ DE-NOVO CASTRATE SENSITIVE PROSTATE CANCER: CT CAP [JULY 2024]-multiple bone metastases; MRI lumbar spine without contrast  [PCP]-L4-L5 lesion.  On the thecal sac.  PSA 700.   Check guardant testing. NO Biopsy. AUG 9th, 2024- PSMA PET - Multifocal intense radiotracer activity within the prostate gland consistent with primary prostate adenocarcinoma; Evidence of nodal metastasis with radiotracer avid tiny lymph nodes in the rectal sheath and periaortic retroperitoneum; Extensive radiotracer avid skeletal metastasis with near confluent lesions within  the pelvis. Lesions in the lumbar spine, RIGHT scapula, LEFT humerus. Lesions too numerous to count. No evidence of visceral metastasis or pulmonary metastasis; awaiting guardant testing. On ADT- [started aug 1st, 2024].   # AUG 1st, 2024- Firmagon  --------------------- # CAD; on coumadin; Bil AKA    Prostate cancer metastatic to bone (HCC)  09/30/2022 Initial Diagnosis   Prostate cancer metastatic to bone (HCC)   10/04/2022 Cancer Staging   Staging form: Prostate, AJCC 8th Edition - Clinical: Stage IVB (cTX, cNX, pM1b, PSA: 703) - Signed by Earna Coder, MD on  10/04/2022 Prostate specific antigen (PSA) range: 20 or greater   11/29/2022 -  Chemotherapy   Patient is on Treatment Plan : PROSTATE Docetaxel (75) q21d       ALLERGIES:  is allergic to bacitracin, vancomycin, ketorolac, and spironolactone.  MEDICATIONS:  Current Outpatient Medications  Medication Sig Dispense Refill   apixaban (ELIQUIS) 5 MG TABS tablet Take 1 tablet (5 mg total) by mouth 2 (two) times daily. 60 tablet 3   buPROPion (WELLBUTRIN XL) 300 MG 24 hr tablet Take 300 mg by mouth daily.     cephALEXin (KEFLEX) 500 MG capsule Take 1 capsule (500 mg total) by mouth 4 (four) times daily. 28 capsule 0   darolutamide (NUBEQA) 300 MG tablet Take 600 mg by mouth 2 (two) times daily with a meal.     dexamethasone (DECADRON) 4 MG tablet Once a day- in AM with breakfast. 30 tablet 0   gabapentin (NEURONTIN) 100 MG capsule Take 1 capsule (100 mg total) by mouth 3 (three) times daily. 60 capsule 0   lidocaine-prilocaine (EMLA) cream Apply 1 Application topically as needed. 30 g 0   ondansetron (ZOFRAN) 8 MG tablet Take 1 tablet (8 mg total) by mouth every 8 (eight) hours as needed for nausea or vomiting. 60 tablet 1   oxyCODONE-acetaminophen (PERCOCET/ROXICET) 5-325 MG tablet Take 1 tablet by mouth every 12 (twelve) hours as needed for moderate pain. 60 tablet 0   senna-docusate (SENOKOT-S) 8.6-50 MG tablet Take 2 tablets by mouth 2 (two) times daily as needed for mild constipation. 100 tablet 0   lisinopril (ZESTRIL) 20 MG tablet Take 20 mg by mouth 2 (two) times daily.     No current facility-administered medications for this visit.    VITAL SIGNS: BP 95/61   Pulse 70   Temp (!) 97 F (36.1 C) (Tympanic)   Resp 20   SpO2 100% Comment: room air There were no vitals filed for this visit.  Estimated body mass index is 60.19 kg/m as calculated from the following:   Height as of 11/29/22: 4\' 10"  (1.473 m).   Weight as of 11/29/22: 288 lb (130.6 kg).  LABS: CBC:    Component Value  Date/Time   WBC 1.2 (L) 12/06/2022 0937   WBC 7.2 10/01/2022 0501   HGB 10.6 (L) 12/06/2022 0937   HCT 31.0 (L) 12/06/2022 0937   PLT 194 12/06/2022 0937   MCV 88.3 12/06/2022 0937   NEUTROABS 0.7 (L) 12/06/2022 0937   LYMPHSABS 0.3 (L) 12/06/2022 0937   MONOABS 0.2 12/06/2022 0937   EOSABS 0.0 12/06/2022 0937   BASOSABS 0.0 12/06/2022 0937   Comprehensive Metabolic Panel:    Component Value Date/Time   NA 132 (L) 12/06/2022 0937   K 4.1 12/06/2022 0937   CL 105 12/06/2022 0937   CO2 21 (L) 12/06/2022 0937   BUN 18 12/06/2022 0937   CREATININE 0.90 12/06/2022 0937   CREATININE 0.68  11/29/2022 0822   GLUCOSE 107 (H) 12/06/2022 0937   CALCIUM 9.0 12/06/2022 0937   AST 15 11/29/2022 0822   ALT 12 11/29/2022 0822   ALKPHOS 620 (H) 11/29/2022 0822   BILITOT 1.9 (H) 11/29/2022 0822   PROT 7.1 11/29/2022 0822   ALBUMIN 4.0 11/29/2022 0822    RADIOGRAPHIC STUDIES: IR IMAGING GUIDED PORT INSERTION  Result Date: 11/14/2022 INDICATION: Chemotherapy access EXAM: Chest port placement using ultrasound and fluoroscopic guidance MEDICATIONS: Documented in the EMR ANESTHESIA/SEDATION: Moderate (conscious) sedation was employed during this procedure. A total of Versed 3 mg and Fentanyl 150 mcg was administered intravenously. Moderate Sedation Time: 37 minutes. The patient's level of consciousness and vital signs were monitored continuously by radiology nursing throughout the procedure under my direct supervision. FLUOROSCOPY TIME:  Fluoroscopy Time: 2.7 minutes (40 mGy) COMPLICATIONS: None immediate. PROCEDURE: Informed written consent was obtained from the patient after a thorough discussion of the procedural risks, benefits and alternatives. All questions were addressed. Maximal Sterile Barrier Technique was utilized including caps, mask, sterile gowns, sterile gloves, sterile drape, hand hygiene and skin antiseptic. A timeout was performed prior to the initiation of the procedure. The patient  was placed supine on the exam table. The right neck and chest was prepped and draped in the standard sterile fashion. A preliminary ultrasound of the right neck was performed and demonstrates a patent right internal jugular vein. A permanent ultrasound image was stored in the electronic medical record. The overlying skin was anesthetized with 1% Lidocaine. Using ultrasound guidance, access was obtained into the right internal jugular vein using a 21 gauge micropuncture set. A wire was advanced into the SVC, a short incision was made at the puncture site, and serial dilatation performed. Next, in an ipsilateral infraclavicular location, an incision was made at the site of the subcutaneous reservoir. Blunt dissection was used to open a pocket to contain the reservoir. A subcutaneous tunnel was then created from the port site to the puncture site. A(n) 8 Fr single lumen catheter was advanced through the tunnel. The catheter was attached to the port and this was placed in the subcutaneous pocket. Under fluoroscopic guidance, a peel away sheath was placed, and the catheter was trimmed to the appropriate length and was advanced into the central veins. The catheter length is 24 cm. The tip of the catheter lies near the superior cavoatrial junction. The port flushes and aspirates appropriately. The port was flushed and locked with heparinized saline. The port pocket was closed in 2 layers using 3-0 and 4-0 Vicryl/absorbable suture. Dermabond was also applied to both incisions. The patient tolerated the procedure well and was transferred to recovery in stable condition. IMPRESSION: Successful placement of a right-sided chest port via the right internal jugular vein. The port is ready for immediate use. Electronically Signed   By: Olive Bass M.D.   On: 11/14/2022 13:07    PERFORMANCE STATUS (ECOG) : 2 - Symptomatic, <50% confined to bed  Review of Systems Unless otherwise noted, a complete review of systems is  negative.  Physical Exam General: NAD Cardiovascular: regular rate and rhythm Pulmonary: clear ant fields Abdomen: soft, nontender, + bowel sounds GU: no suprapubic tenderness Extremities: Bilateral lower extremity amputations Skin: Area of erythema on abdomen less red  neurological: Weakness but otherwise nonfocal  IMPRESSION/PLAN: Stage IV prostate cancer -on docetaxel and Firmagon  Abdominal erythema -improving.  Continue/complete course of cephalexin.  Phantom limb pain -improved on gabapentin  Neutropenia -likely at nadir.  No fever or  chills.  Patient on cephalexin.  Discussed with Dr. Donneta Romberg and will likely start patient on G-CSF at time of next treatment.  Reviewed neutropenic precautions with patient.  Dehydration -proceed with IV fluids today  Case and plan discussed with Dr. Donneta Romberg   Patient expressed understanding and was in agreement with this plan. He also understands that He can call clinic at any time with any questions, concerns, or complaints.   Thank you for allowing me to participate in the care of this very pleasant patient.   Time Total: 15 minutes  Visit consisted of counseling and education dealing with the complex and emotionally intense issues of symptom management in the setting of serious illness.Greater than 50%  of this time was spent counseling and coordinating care related to the above assessment and plan.  Signed by: Laurette Schimke, PhD, NP-C

## 2022-12-12 ENCOUNTER — Inpatient Hospital Stay: Payer: Medicare PPO | Admitting: Hospice and Palliative Medicine

## 2022-12-12 DIAGNOSIS — G893 Neoplasm related pain (acute) (chronic): Secondary | ICD-10-CM

## 2022-12-12 DIAGNOSIS — C7951 Secondary malignant neoplasm of bone: Secondary | ICD-10-CM | POA: Diagnosis not present

## 2022-12-12 DIAGNOSIS — C61 Malignant neoplasm of prostate: Secondary | ICD-10-CM

## 2022-12-12 DIAGNOSIS — Z515 Encounter for palliative care: Secondary | ICD-10-CM | POA: Diagnosis not present

## 2022-12-12 NOTE — Progress Notes (Unsigned)
Virtual Visit via Telephone Note  I connected with [name] on 12/12/22 at  3:00 PM EDT by telephone and verified that I am speaking with the correct person using two identifiers.  Location: Patient: Home Provider: Clinic   I discussed the limitations, risks, security and privacy concerns of performing an evaluation and management service by telephone and the availability of in person appointments. I also discussed with the patient that there may be a patient responsible charge related to this service. The patient expressed understanding and agreed to proceed.   History of Present Illness: [name] is a 67 y.o. 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   Observations/Objective: I called and spoke with patient by phone.  He reports that he is doing well.  Denies any significant symptomatic complaints or concerns.  Reports improved pain.  He has had resolution of the erythematous area on his abdomen.  Denies fever or chills.  Good appetite.  Patient says that he intends to restart an intense exercise regimen in anticipation of a fundraiser.  Assessment and Plan: Stage IV prostate cancer -on docetaxel and Firmagon  Phantom limb pain -improved on gabapentin  Follow Up Instructions: Follow up telephone visit 1-2 months   I discussed the assessment and treatment plan with the patient. The patient was provided an opportunity to ask questions and all were answered. The patient agreed with the plan and demonstrated an understanding of the instructions.   The patient was advised to call back or seek an in-person evaluation if the symptoms worsen or if the condition fails to improve as anticipated.  I provided 15 minutes of non-face-to-face time during this encounter.   Malachy Moan, NP

## 2022-12-13 ENCOUNTER — Other Ambulatory Visit: Payer: Self-pay

## 2022-12-13 ENCOUNTER — Ambulatory Visit: Payer: Medicare PPO | Admitting: Radiation Oncology

## 2022-12-13 ENCOUNTER — Inpatient Hospital Stay: Payer: Medicare PPO

## 2022-12-13 ENCOUNTER — Other Ambulatory Visit: Payer: Medicare PPO

## 2022-12-13 ENCOUNTER — Encounter: Payer: Self-pay | Admitting: Internal Medicine

## 2022-12-13 NOTE — Progress Notes (Signed)
[facility] CSW Progress Note  Clinical Child psychotherapist contacted patient per the request of the medical provider to assess social isolation.  Patient reports that since his immune system is compromised during his treatment, he does not want visitors in his home.  Patient stated his primary concern is his phantom pain in his legs.  His medical team is aware of this and are working with him to help resolve.  He also reported his short term memory has declined over the past week.  But, he is able to eventually recall the thought, it is just not as fast.  CSW provided active listening and supportive counseling.  Patient was open to CSW follow up.    Dorothey Baseman, LCSW Clinical Social Worker [facility]

## 2022-12-17 ENCOUNTER — Ambulatory Visit: Payer: Medicare PPO | Admitting: Radiation Oncology

## 2022-12-20 ENCOUNTER — Ambulatory Visit: Payer: Medicare PPO

## 2022-12-20 ENCOUNTER — Other Ambulatory Visit: Payer: Medicare PPO

## 2022-12-20 ENCOUNTER — Ambulatory Visit: Payer: Medicare PPO | Admitting: Internal Medicine

## 2022-12-21 ENCOUNTER — Ambulatory Visit: Payer: Medicare PPO

## 2022-12-23 ENCOUNTER — Encounter: Payer: Self-pay | Admitting: Hospice and Palliative Medicine

## 2022-12-24 ENCOUNTER — Inpatient Hospital Stay: Payer: Medicare PPO

## 2022-12-24 ENCOUNTER — Encounter: Payer: Self-pay | Admitting: Internal Medicine

## 2022-12-24 ENCOUNTER — Inpatient Hospital Stay (HOSPITAL_BASED_OUTPATIENT_CLINIC_OR_DEPARTMENT_OTHER): Payer: Medicare PPO | Admitting: Internal Medicine

## 2022-12-24 ENCOUNTER — Other Ambulatory Visit: Payer: Self-pay | Admitting: *Deleted

## 2022-12-24 VITALS — BP 103/65 | HR 63 | Temp 98.2°F | Wt 288.0 lb

## 2022-12-24 DIAGNOSIS — C61 Malignant neoplasm of prostate: Secondary | ICD-10-CM | POA: Diagnosis not present

## 2022-12-24 DIAGNOSIS — Z5111 Encounter for antineoplastic chemotherapy: Secondary | ICD-10-CM | POA: Diagnosis not present

## 2022-12-24 DIAGNOSIS — C7951 Secondary malignant neoplasm of bone: Secondary | ICD-10-CM

## 2022-12-24 LAB — CMP (CANCER CENTER ONLY)
ALT: 21 U/L (ref 0–44)
AST: 19 U/L (ref 15–41)
Albumin: 3.3 g/dL — ABNORMAL LOW (ref 3.5–5.0)
Alkaline Phosphatase: 143 U/L — ABNORMAL HIGH (ref 38–126)
Anion gap: 6 (ref 5–15)
BUN: 18 mg/dL (ref 8–23)
CO2: 21 mmol/L — ABNORMAL LOW (ref 22–32)
Calcium: 8.5 mg/dL — ABNORMAL LOW (ref 8.9–10.3)
Chloride: 104 mmol/L (ref 98–111)
Creatinine: 0.72 mg/dL (ref 0.61–1.24)
GFR, Estimated: >60 mL/min (ref >60)
Glucose, Bld: 84 mg/dL (ref 70–99)
Potassium: 4.9 mmol/L (ref 3.5–5.1)
Sodium: 131 mmol/L — ABNORMAL LOW (ref 135–145)
Total Bilirubin: 2 mg/dL — ABNORMAL HIGH (ref 0.3–1.2)
Total Protein: 5.9 g/dL — ABNORMAL LOW (ref 6.5–8.1)

## 2022-12-24 LAB — CBC WITH DIFFERENTIAL (CANCER CENTER ONLY)
Abs Immature Granulocytes: 0.02 10*3/uL (ref 0.00–0.07)
Basophils Absolute: 0 10*3/uL (ref 0.0–0.1)
Basophils Relative: 0 %
Eosinophils Absolute: 0 10*3/uL (ref 0.0–0.5)
Eosinophils Relative: 0 %
HCT: 34.2 % — ABNORMAL LOW (ref 39.0–52.0)
Hemoglobin: 11.3 g/dL — ABNORMAL LOW (ref 13.0–17.0)
Immature Granulocytes: 0 %
Lymphocytes Relative: 15 %
Lymphs Abs: 0.7 10*3/uL (ref 0.7–4.0)
MCH: 30.8 pg (ref 26.0–34.0)
MCHC: 33 g/dL (ref 30.0–36.0)
MCV: 93.2 fL (ref 80.0–100.0)
Monocytes Absolute: 0.5 10*3/uL (ref 0.1–1.0)
Monocytes Relative: 11 %
Neutro Abs: 3.7 10*3/uL (ref 1.7–7.7)
Neutrophils Relative %: 74 %
Platelet Count: 152 10*3/uL (ref 150–400)
RBC: 3.67 MIL/uL — ABNORMAL LOW (ref 4.22–5.81)
RDW: 15.3 % (ref 11.5–15.5)
WBC Count: 5 10*3/uL (ref 4.0–10.5)
nRBC: 0 % (ref 0.0–0.2)

## 2022-12-24 LAB — PSA: Prostatic Specific Antigen: 70.6 ng/mL — ABNORMAL HIGH (ref 0.00–4.00)

## 2022-12-24 MED ORDER — SODIUM CHLORIDE 0.9% FLUSH
10.0000 mL | INTRAVENOUS | Status: DC | PRN
  Administered 2022-12-24: 10 mL
  Filled 2022-12-24: qty 10

## 2022-12-24 MED ORDER — SODIUM CHLORIDE 0.9 % IV SOLN
60.0000 mg/m2 | Freq: Once | INTRAVENOUS | Status: AC
  Administered 2022-12-24: 139 mg via INTRAVENOUS
  Filled 2022-12-24: qty 13.9

## 2022-12-24 MED ORDER — DEXAMETHASONE SODIUM PHOSPHATE 10 MG/ML IJ SOLN
10.0000 mg | Freq: Once | INTRAMUSCULAR | Status: AC
  Administered 2022-12-24: 10 mg via INTRAVENOUS
  Filled 2022-12-24: qty 1

## 2022-12-24 MED ORDER — GABAPENTIN 100 MG PO CAPS: 100.0000 mg | ORAL_CAPSULE | Freq: Three times a day (TID) | ORAL | 1 refills | Status: DC

## 2022-12-24 MED ORDER — HEPARIN SOD (PORK) LOCK FLUSH 100 UNIT/ML IV SOLN
500.0000 [IU] | Freq: Once | INTRAVENOUS | Status: AC | PRN
  Administered 2022-12-24: 500 [IU]
  Filled 2022-12-24: qty 5

## 2022-12-24 MED ORDER — SODIUM CHLORIDE 0.9 % IV SOLN
Freq: Once | INTRAVENOUS | Status: AC
  Filled 2022-12-24: qty 250

## 2022-12-24 NOTE — Patient Instructions (Signed)
#   To help prevent pain from the booster injection recommend Claritin day for 7 days.

## 2022-12-24 NOTE — Assessment & Plan Note (Addendum)
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  On the thecal sac. Pre treatment : PSA 700. AUG 9th, 2024- PSMA PET - Multifocal intense radiotracer activity within the prostate gland consistent with primary prostate adenocarcinoma; Evidence of nodal metastasis with radiotracer avid tiny lymph nodes in the rectal sheath and periaortic retroperitoneum; Extensive radiotracer avid skeletal metastasis with near confluent lesions within the pelvis. Lesions in the lumbar spine, RIGHT scapula, LEFT humerus. Lesions too numerous to count. No evidence of visceral metastasis or pulmonary metastasis; AUG 2024-guardant testing- ATM [eligible for PARPi]. On ADT- [started aug 1st, 2024]. PLAN: ADT + Taxotere+ Darolutamide   # Proceed with cycle #2 of Taxotere [at 60 mg/m2 dose] . Labs-CBC/chemistries were reviewed with the patient slightly elevated Bili sec to [employer] syndrome. Growth factor would be given as prophylaxis for chemotherapy-induced neutropenia to prevent febrile neutropenias. Discussed potential side effect- myalgias/arthralgias- recommend Claritin for 7 days.   # Neutropenia cellulitis needing antibiotics.  Secondary chemotherapy adequate factor.  # Back pain pain medication: Patient currently on oxycodone 1 BID; gabapentin- refilled prescription.  S/p RT-  Dr. Pablo Ledger RT10/03/2022.   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   # ATM-genetic mutations.  Discussed regarding genetic counseling [pt has brother not seen > 20y; children]-  stable.   # IV access: s/p mediport-   # Insomnia: recommend melatonin prn at bedtime.   Firmagon q 4 W-10/29-needs transition to Lupron-  # DISPOSITION: # referral to genetic counselling- re: ATM # Taxotere today  # tomorrow- injection # Follow up 7-10 days- APP; labs- cbc/bmp- Firmagon  # follow up in 3 weeks- MD; labs-  cbc/cmp;PSA; Taxotere-; D2- injection- Dr.B

## 2022-12-24 NOTE — Progress Notes (Addendum)
[facility] OFFICE PROGRESS NOTE  Patient Care Team: Zada Finders Joycie Peek, MD as PCP - General (Family Medicine) Earna Coder, MD as Consulting Physician (Oncology) Carmina Miller, MD as Consulting Physician (Radiation Oncology)   Cancer Staging  Prostate cancer metastatic to bone [facility]) Staging form: Prostate, AJCC 8th Edition - Clinical: Stage IVB (cTX, cNX, pM1b, PSA: 703) - Signed by Earna Coder, MD on 10/04/2022 Prostate specific antigen (PSA) range: 20 or greater    Oncology History Overview Note  #HIGH RISK/ DE-NOVO CASTRATE SENSITIVE PROSTATE CANCER: CT CAP [JULY 2024]-multiple bone metastases; MRI lumbar spine without contrast  [PCP]-L4-L5 lesion.  On the thecal sac.  PSA 700.   Check guardant testing. NO Biopsy. AUG 9th, 2024- PSMA PET - Multifocal intense radiotracer activity within the prostate gland consistent with primary prostate adenocarcinoma; Evidence of nodal metastasis with radiotracer avid tiny lymph nodes in the rectal sheath and periaortic retroperitoneum; Extensive radiotracer avid skeletal metastasis with near confluent lesions within the pelvis. Lesions in the lumbar spine, RIGHT scapula, LEFT humerus. Lesions too numerous to count. No evidence of visceral metastasis or pulmonary metastasis; awaiting guardant testing. On ADT- [started aug 1st, 2024].   # AUG 1st, 2024- Firmagon  --------------------- # CAD; on coumadin; Bil AKA    Prostate cancer metastatic to bone (HCC)  09/30/2022 Initial Diagnosis   Prostate cancer metastatic to bone (HCC)   10/04/2022 Cancer Staging   Staging form: Prostate, AJCC 8th Edition - Clinical: Stage IVB (cTX, cNX, pM1b, PSA: 703) - Signed by Earna Coder, MD on 10/04/2022 Prostate specific antigen (PSA) range: 20 or greater   11/29/2022 -  Chemotherapy   Patient is on Treatment Plan : PROSTATE Docetaxel (75) q21d        HISTORY OF PRESENT ILLNESS: Patient ambulating-with wheel chair/AKA.   Patient is alone.  [name] 67 y.o.  male pleasant patient with a medical history significant of s/p of bilateral AKA, PE on Coumadin, HTN, depression with  castrate sensitive metastatic prostate cancer is here to proceed with on Taxoetere chemotherapy.  Postchemotherapy patient noted to have cellulitis of the abdomen at the site of his injection.  Patient was neutropenic.  Status post antibiotics cellulitis resolved.   Can he get something for sleep, only getting 3 hours a night on average.   Has been having trouble swallowing solids this past week.  Patient s/p radiation to his lower lumbar/sacral area.  Continues to have shooting pain in his legs stumps bilaterally. Refill gabapentin, pending. Otherwise, over all well controlled.   Denies any bladder or bowel incontinence.    Review of Systems  Constitutional:  Positive for malaise/fatigue. Negative for chills, diaphoresis, fever and weight loss.  HENT:  Negative for nosebleeds and sore throat.   Eyes:  Negative for double vision.  Respiratory:  Negative for cough, hemoptysis, sputum production, shortness of breath and wheezing.   Cardiovascular:  Positive for leg swelling. Negative for chest pain, palpitations and orthopnea.  Gastrointestinal:  Negative for abdominal pain, blood in stool, constipation, diarrhea, heartburn, melena, nausea and vomiting.  Genitourinary:  Negative for dysuria, frequency and urgency.  Musculoskeletal:  Positive for back pain, joint pain and myalgias.  Skin: Negative.  Negative for itching and rash.  Neurological:  Negative for dizziness, tingling, focal weakness, weakness and headaches.  Endo/Heme/Allergies:  Does not bruise/bleed easily.  Psychiatric/Behavioral:  Negative for depression. The patient is not nervous/anxious and does not have insomnia.       PAST MEDICAL HISTORY :  Past Medical History:  Diagnosis Date   Depression    History of blood clots 08/13/2011   History of left knee  replacement    HTN (hypertension)    Lymphedema    lt leg-per pt   PE (pulmonary thromboembolism) (HCC)    S/P AKA (above knee amputation) bilateral (HCC)    rt 11/24/07 and lt 01/11/17    PAST SURGICAL HISTORY :   Past Surgical History:  Procedure Laterality Date   ACHILLES TENDON SURGERY Left    per pt   bone clip removal Left 10/04/2015   knee   CARDIAC SURGERY  11/2012   cath.-per pt   CERVICAL FUSION  07/28/2015   per pt   EPIGASTRIC HERNIA REPAIR     per pt   hematoma removal Left 11/02/2015   knee   HIP SURGERY Right    x8 per pt   IR IMAGING GUIDED PORT INSERTION  11/14/2022   KNEE FUSION Right    KNEE JOINT MANIPULATION Left 09/11/2011   KNEE SURGERY Left    x3   leg stump Left 03/2017   leg surgery-per pt   NECK SURGERY  02/2015   plates-per pt   REPLACEMENT TOTAL KNEE Right    REPLACEMENT TOTAL KNEE Left 07/02/2011   again in 07/10/2011 per pt   s/p of bilateral AKA Right    screen filter removal  10/20/2008   for embolism per pt   screen filter replacement     x3   SHOULDER SURGERY Right    x2-per pt   SPINE SURGERY  02/11/2015   vertebra spacer-per pt   toe nail removal Right    per pt   VARICOSE VEIN SURGERY     x5-per pt    FAMILY HISTORY :   Family History  Problem Relation Age of Onset   Alzheimer's disease Mother    Lung cancer Father     SOCIAL HISTORY:   Social History   Tobacco Use   Smoking status: Never   Smokeless tobacco: Former  Building services engineer status: Never Used  Substance Use Topics   Alcohol use: Yes    Alcohol/week: 3.0 standard drinks of alcohol    Types: 3 Glasses of wine per week   Drug use: Never    ALLERGIES:  is allergic to bacitracin, vancomycin, ketorolac, and spironolactone.  MEDICATIONS:  Current Outpatient Medications  Medication Sig Dispense Refill   apixaban (ELIQUIS) 5 MG TABS tablet Take 1 tablet (5 mg total) by mouth 2 (two) times daily. 60 tablet 3   buPROPion (WELLBUTRIN XL) 300 MG 24 hr  tablet Take 300 mg by mouth daily.     darolutamide (NUBEQA) 300 MG tablet Take 600 mg by mouth 2 (two) times daily with a meal.     lidocaine-prilocaine (EMLA) cream Apply 1 Application topically as needed. 30 g 0   ondansetron (ZOFRAN) 8 MG tablet Take 1 tablet (8 mg total) by mouth every 8 (eight) hours as needed for nausea or vomiting. 60 tablet 1   oxyCODONE-acetaminophen (PERCOCET/ROXICET) 5-325 MG tablet Take 1 tablet by mouth every 12 (twelve) hours as needed for moderate pain. 60 tablet 0   gabapentin (NEURONTIN) 100 MG capsule Take 1 capsule (100 mg total) by mouth 3 (three) times daily. 90 capsule 1   lisinopril (ZESTRIL) 20 MG tablet Take 20 mg by mouth 2 (two) times daily.     senna-docusate (SENOKOT-S) 8.6-50 MG tablet Take 2 tablets by mouth 2 (two) times  daily as needed for mild constipation. (Patient not taking: Reported on 12/24/2022) 100 tablet 0   No current facility-administered medications for this visit.   Facility-Administered Medications Ordered in Other Visits  Medication Dose Route Frequency Provider Last Rate Last Admin   dexamethasone (DECADRON) injection 10 mg  10 mg Intravenous Once Earna Coder, MD       DOCEtaxel (TAXOTERE) 139 mg in sodium chloride 0.9 % 250 mL chemo infusion  60 mg/m2 (Treatment Plan Recorded) Intravenous Once Earna Coder, MD       heparin lock flush 100 unit/mL  500 Units Intracatheter Once PRN Earna Coder, MD        PHYSICAL EXAMINATION:   BP 103/65 (BP Location: Left Arm, Patient Position: Sitting, Cuff Size: Large)   Pulse 63   Temp 98.2 F (36.8 C) (Tympanic)   Wt 288 lb (130.6 kg) Comment: per pt  SpO2 100%   BMI 60.19 kg/m   Filed Weights   12/24/22 1001  Weight: 288 lb (130.6 kg)     Patient has bilateral AKA.   Physical Exam Vitals and nursing note reviewed.  HENT:     Head: Normocephalic and atraumatic.     Mouth/Throat:     Pharynx: Oropharynx is clear.  Eyes:     Extraocular  Movements: Extraocular movements intact.     Pupils: Pupils are equal, round, and reactive to light.  Cardiovascular:     Rate and Rhythm: Normal rate and regular rhythm.  Pulmonary:     Comments: Decreased breath sounds bilaterally.  Abdominal:     Palpations: Abdomen is soft.  Musculoskeletal:        General: Normal range of motion.     Cervical back: Normal range of motion.  Skin:    General: Skin is warm.  Neurological:     General: No focal deficit present.     Mental Status: He is alert and oriented to person, place, and time.  Psychiatric:        Behavior: Behavior normal.        Judgment: Judgment normal.      LABORATORY DATA:  I have reviewed the data as listed    Component Value Date/Time   NA 131 (L) 12/24/2022 0948   K 4.9 12/24/2022 0948   CL 104 12/24/2022 0948   CO2 21 (L) 12/24/2022 0948   GLUCOSE 84 12/24/2022 0948   BUN 18 12/24/2022 0948   CREATININE 0.72 12/24/2022 0948   CALCIUM 8.5 (L) 12/24/2022 0948   PROT 5.9 (L) 12/24/2022 0948   ALBUMIN 3.3 (L) 12/24/2022 0948   AST 19 12/24/2022 0948   ALT 21 12/24/2022 0948   ALKPHOS 143 (H) 12/24/2022 0948   BILITOT 2.0 (H) 12/24/2022 0948   GFRNONAA >60 12/24/2022 0948   GFRAA >60 10/19/2016 1335    No results found for: "SPEP", "UPEP"  Lab Results  Component Value Date   WBC 5.0 12/24/2022   NEUTROABS 3.7 12/24/2022   HGB 11.3 (L) 12/24/2022   HCT 34.2 (L) 12/24/2022   MCV 93.2 12/24/2022   PLT 152 12/24/2022      Chemistry      Component Value Date/Time   NA 131 (L) 12/24/2022 0948   K 4.9 12/24/2022 0948   CL 104 12/24/2022 0948   CO2 21 (L) 12/24/2022 0948   BUN 18 12/24/2022 0948   CREATININE 0.72 12/24/2022 0948      Component Value Date/Time   CALCIUM 8.5 (L) 12/24/2022 1308  ALKPHOS 143 (H) 12/24/2022 0948   AST 19 12/24/2022 0948   ALT 21 12/24/2022 0948   BILITOT 2.0 (H) 12/24/2022 0948       RADIOGRAPHIC STUDIES: I have personally reviewed the radiological images  as listed and agreed with the findings in the report. No results found.   ASSESSMENT & PLAN:  Prostate cancer metastatic to bone (HCC) #HIGH RISK/ DE-NOVO CASTRATE SENSITIVE PROSTATE CANCER: CT CAP [JULY 2024]-multiple bone metastases; MRI lumbar spine without contrast  [PCP]-L4-L5 lesion.  On the thecal sac. Pre treatment : PSA 700. AUG 9th, 2024- PSMA PET - Multifocal intense radiotracer activity within the prostate gland consistent with primary prostate adenocarcinoma; Evidence of nodal metastasis with radiotracer avid tiny lymph nodes in the rectal sheath and periaortic retroperitoneum; Extensive radiotracer avid skeletal metastasis with near confluent lesions within the pelvis. Lesions in the lumbar spine, RIGHT scapula, LEFT humerus. Lesions too numerous to count. No evidence of visceral metastasis or pulmonary metastasis; AUG 2024-guardant testing- ATM [eligible for PARPi]. On ADT- [started aug 1st, 2024]. PLAN: ADT + Taxotere+ Darolutamide   # Proceed with cycle #2 of Taxotere [at 60 mg/m2 dose] . Labs-CBC/chemistries were reviewed with the patient slightly elevated Bili sec to [employer] syndrome. Growth factor would be given as prophylaxis for chemotherapy-induced neutropenia to prevent febrile neutropenias. Discussed potential side effect- myalgias/arthralgias- recommend Claritin for 7 days.   # Neutropenia cellulitis needing antibiotics.  Secondary chemotherapy adequate factor.  # Back pain pain medication: Patient currently on oxycodone 1 BID; gabapentin- refilled prescription.  S/p RT-  Dr. Pablo Ledger RT10/03/2022.   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   # ATM-genetic mutations.  Discussed regarding genetic counseling [pt has brother not seen > 20y; children]-  stable.   # IV access: s/p mediport-   # Insomnia: recommend melatonin prn at bedtime.   Firmagon q 4 W-10/29-needs transition to Lupron-  # DISPOSITION: # referral to genetic counselling-  re: ATM # Taxotere today  # tomorrow- injection # Follow up 7-10 days- APP; labs- cbc/bmp- Firmagon  # follow up in 3 weeks- MD; labs- cbc/cmp;PSA; Taxotere-; D2- injection- Dr.B     Orders Placed This Encounter  Procedures   CBC with Differential (Cancer Center Only)    Standing Status:   Future    Standing Expiration Date:   01/14/2024   CMP (Cancer Center only)    Standing Status:   Future    Standing Expiration Date:   01/14/2024   CBC with Differential (Cancer Center Only)    Standing Status:   Future    Standing Expiration Date:   12/24/2023   CMP (Cancer Center only)    Standing Status:   Future    Standing Expiration Date:   12/24/2023   PSA    Standing Status:   Future    Standing Expiration Date:   12/24/2023   Ambulatory referral to Genetics    Referral Priority:   Routine    Referral Type:   Consultation    Referral Reason:   Specialty Services Required    Number of Visits Requested:   1   All questions were answered. The patient knows to call the clinic with any problems, questions or concerns.      Earna Coder, MD 12/24/2022 11:48 AM

## 2022-12-24 NOTE — Telephone Encounter (Signed)
Josh-please sent rf on gabapentin. Pt is out.

## 2022-12-24 NOTE — Addendum Note (Signed)
Addended by: Louretta Shorten R on: 12/24/2022 11:48 AM   Modules accepted: Orders

## 2022-12-24 NOTE — Progress Notes (Signed)
Refill gabapentin, pending.  Can he get something for sleep, only getting 3 hours a night on average.  Has been having trouble swallowing solids this past week.  At next scan visit can he also have radiology take a look at his aortic aneurism?

## 2022-12-25 ENCOUNTER — Inpatient Hospital Stay: Payer: Medicare PPO

## 2022-12-25 DIAGNOSIS — C61 Malignant neoplasm of prostate: Secondary | ICD-10-CM

## 2022-12-25 DIAGNOSIS — Z5111 Encounter for antineoplastic chemotherapy: Secondary | ICD-10-CM | POA: Diagnosis not present

## 2022-12-25 MED ORDER — PEGFILGRASTIM-JMDB 6 MG/0.6ML ~~LOC~~ SOSY
6.0000 mg | PREFILLED_SYRINGE | Freq: Once | SUBCUTANEOUS | Status: AC
  Administered 2022-12-25: 6 mg via SUBCUTANEOUS
  Filled 2022-12-25: qty 0.6

## 2022-12-26 NOTE — Addendum Note (Signed)
Encounter addended by: Edward Qualia on: 12/26/2022 4:07 PM  Actions taken: Imaging Exam ended

## 2022-12-26 NOTE — Addendum Note (Signed)
Encounter addended by: Edward Qualia on: 12/26/2022 4:09 PM  Actions taken: Imaging Exam ended

## 2022-12-31 ENCOUNTER — Telehealth: Payer: Self-pay | Admitting: *Deleted

## 2022-12-31 NOTE — Telephone Encounter (Signed)
Per Alfonse Ras I called to offer a [facility] visit; pt states he has an appt tomm. He does not have a ride for today. He said he spoke to [facility] on Friday regarding new symptoms; Eyesight changes, neck pain. Memory issues. Having swelling in bilateral hands, Left > right. Plan for Josh to see patient tomm instead of Lauren. Patient is in agreement of the plan.

## 2023-01-01 ENCOUNTER — Inpatient Hospital Stay: Payer: Medicare PPO

## 2023-01-01 ENCOUNTER — Other Ambulatory Visit: Payer: Self-pay

## 2023-01-01 ENCOUNTER — Encounter: Payer: Self-pay | Admitting: Hospice and Palliative Medicine

## 2023-01-01 ENCOUNTER — Inpatient Hospital Stay (HOSPITAL_BASED_OUTPATIENT_CLINIC_OR_DEPARTMENT_OTHER): Payer: Medicare PPO | Admitting: Hospice and Palliative Medicine

## 2023-01-01 ENCOUNTER — Inpatient Hospital Stay: Payer: Medicare PPO | Admitting: Nurse Practitioner

## 2023-01-01 VITALS — BP 125/72 | HR 69 | Temp 98.8°F | Resp 20

## 2023-01-01 DIAGNOSIS — C61 Malignant neoplasm of prostate: Secondary | ICD-10-CM

## 2023-01-01 DIAGNOSIS — R479 Unspecified speech disturbances: Secondary | ICD-10-CM | POA: Diagnosis not present

## 2023-01-01 DIAGNOSIS — H547 Unspecified visual loss: Secondary | ICD-10-CM | POA: Diagnosis not present

## 2023-01-01 DIAGNOSIS — C7951 Secondary malignant neoplasm of bone: Secondary | ICD-10-CM | POA: Diagnosis not present

## 2023-01-01 DIAGNOSIS — Z5111 Encounter for antineoplastic chemotherapy: Secondary | ICD-10-CM | POA: Diagnosis not present

## 2023-01-01 LAB — CBC WITH DIFFERENTIAL (CANCER CENTER ONLY)
Abs Immature Granulocytes: 3.84 10*3/uL — ABNORMAL HIGH (ref 0.00–0.07)
Basophils Absolute: 0 10*3/uL (ref 0.0–0.1)
Basophils Relative: 0 %
Eosinophils Absolute: 0 10*3/uL (ref 0.0–0.5)
Eosinophils Relative: 0 %
HCT: 31.4 % — ABNORMAL LOW (ref 39.0–52.0)
Hemoglobin: 10.3 g/dL — ABNORMAL LOW (ref 13.0–17.0)
Immature Granulocytes: 14 %
Lymphocytes Relative: 2 %
Lymphs Abs: 0.7 10*3/uL (ref 0.7–4.0)
MCH: 30.7 pg (ref 26.0–34.0)
MCHC: 32.8 g/dL (ref 30.0–36.0)
MCV: 93.7 fL (ref 80.0–100.0)
Monocytes Absolute: 1.1 10*3/uL — ABNORMAL HIGH (ref 0.1–1.0)
Monocytes Relative: 4 %
Neutro Abs: 21.8 10*3/uL — ABNORMAL HIGH (ref 1.7–7.7)
Neutrophils Relative %: 80 %
Platelet Count: 126 10*3/uL — ABNORMAL LOW (ref 150–400)
RBC: 3.35 MIL/uL — ABNORMAL LOW (ref 4.22–5.81)
RDW: 15 % (ref 11.5–15.5)
Smear Review: NORMAL
WBC Count: 27.4 10*3/uL — ABNORMAL HIGH (ref 4.0–10.5)
nRBC: 0 % (ref 0.0–0.2)

## 2023-01-01 LAB — CMP (CANCER CENTER ONLY)
ALT: 16 U/L (ref 0–44)
AST: 18 U/L (ref 15–41)
Albumin: 3.3 g/dL — ABNORMAL LOW (ref 3.5–5.0)
Alkaline Phosphatase: 119 U/L (ref 38–126)
Anion gap: 8 (ref 5–15)
BUN: 11 mg/dL (ref 8–23)
CO2: 23 mmol/L (ref 22–32)
Calcium: 8.9 mg/dL (ref 8.9–10.3)
Chloride: 102 mmol/L (ref 98–111)
Creatinine: 0.77 mg/dL (ref 0.61–1.24)
GFR, Estimated: >60 mL/min (ref >60)
Glucose, Bld: 131 mg/dL — ABNORMAL HIGH (ref 70–99)
Potassium: 4.9 mmol/L (ref 3.5–5.1)
Sodium: 133 mmol/L — ABNORMAL LOW (ref 135–145)
Total Bilirubin: 0.8 mg/dL (ref 0.3–1.2)
Total Protein: 6.2 g/dL — ABNORMAL LOW (ref 6.5–8.1)

## 2023-01-01 MED ORDER — DEGARELIX ACETATE 80 MG ~~LOC~~ SOLR
80.0000 mg | Freq: Once | SUBCUTANEOUS | Status: AC
  Administered 2023-01-01: 80 mg via SUBCUTANEOUS
  Filled 2023-01-01: qty 4

## 2023-01-01 NOTE — Progress Notes (Signed)
Symptom Management Clinic [facility] Telephone:([phone]) [phone] Fax:([phone]) [phone]  Patient Care Team: Dione Housekeeper, MD as PCP - General (Family Medicine) Earna Coder, MD as Consulting Physician (Oncology) Carmina Miller, MD as Consulting Physician (Radiation Oncology)   NAME OF PATIENT: [name]  [id number]  [birth year]   DATE OF VISIT: 01/01/23  REASON FOR CONSULT: [name] is a 67 y.o. 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   INTERVAL HISTORY: Patient received day 1, cycle 2 docetaxel and darolutamide on 12/24/2022.  He saw Dr. Donneta Romberg on 12/24/2022 at which time patient was having some difficulty swallowing solids.  Patient presents [facility] today with multiple complaints including difficulty swallowing solid foods, severe neck pain, visual changes, and drooling.  He denies headaches.  No fever or chills.  Says that he has noticed intermittent drooling of saliva.  Has been primarily eating soft and liquid foods as he says that solid foods are difficult to swallow.  Reports some difficulty focusing while working on the computer and at times sees bright spots.  Has not seen an optometrist or ophthalmologist recently.  Denies weakness in upper extremities or lower extremities.  Continues to workout daily on rowing machine.  Denies confusion or changes in speech.  Denies recent fevers or illnesses. Denies any easy bleeding or bruising. Reports good appetite and denies weight loss. Denies chest pain. Denies any nausea, vomiting, constipation, or diarrhea. Denies urinary complaints. Patient offers no further specific complaints today.   PAST MEDICAL HISTORY: Past Medical History:  Diagnosis Date    Depression    History of blood clots 08/13/2011   History of left knee replacement    HTN (hypertension)    Lymphedema    lt leg-per pt   PE (pulmonary thromboembolism) (HCC)    S/P AKA (above knee amputation) bilateral (HCC)    rt 11/24/07 and lt 01/11/17    PAST SURGICAL HISTORY:  Past Surgical History:  Procedure Laterality Date   ACHILLES TENDON SURGERY Left    per pt   bone clip removal Left 10/04/2015   knee   CARDIAC SURGERY  11/2012   cath.-per pt   CERVICAL FUSION  07/28/2015   per pt   EPIGASTRIC HERNIA REPAIR     per pt   hematoma removal Left 11/02/2015   knee   HIP SURGERY Right    x8 per pt   IR IMAGING GUIDED PORT INSERTION  11/14/2022   KNEE FUSION Right    KNEE JOINT MANIPULATION Left 09/11/2011   KNEE SURGERY Left    x3   leg stump Left 03/2017   leg surgery-per pt   NECK SURGERY  02/2015   plates-per pt   REPLACEMENT TOTAL KNEE Right    REPLACEMENT TOTAL KNEE Left 07/02/2011   again in 07/10/2011 per pt   s/p of bilateral AKA Right    screen filter removal  10/20/2008   for embolism per pt   screen filter replacement     x3   SHOULDER SURGERY Right    x2-per pt   SPINE SURGERY  02/11/2015   vertebra spacer-per pt   toe nail removal Right    per pt   VARICOSE VEIN SURGERY     x5-per pt    HEMATOLOGY/ONCOLOGY HISTORY:  Oncology History Overview Note  #HIGH RISK/ DE-NOVO  CASTRATE SENSITIVE PROSTATE CANCER: CT CAP [JULY 2024]-multiple bone metastases; MRI lumbar spine without contrast  [PCP]-L4-L5 lesion.  On the thecal sac.  PSA 700.   Check guardant testing. NO Biopsy. AUG 9th, 2024- PSMA PET - Multifocal intense radiotracer activity within the prostate gland consistent with primary prostate adenocarcinoma; Evidence of nodal metastasis with radiotracer avid tiny lymph nodes in the rectal sheath and periaortic retroperitoneum; Extensive radiotracer avid skeletal metastasis with near confluent lesions within the pelvis. Lesions in the lumbar spine,  RIGHT scapula, LEFT humerus. Lesions too numerous to count. No evidence of visceral metastasis or pulmonary metastasis; awaiting guardant testing. On ADT- [started aug 1st, 2024].   # AUG 1st, 2024- Firmagon  --------------------- # CAD; on coumadin; Bil AKA    Prostate cancer metastatic to bone (HCC)  09/30/2022 Initial Diagnosis   Prostate cancer metastatic to bone (HCC)   10/04/2022 Cancer Staging   Staging form: Prostate, AJCC 8th Edition - Clinical: Stage IVB (cTX, cNX, pM1b, PSA: 703) - Signed by Earna Coder, MD on 10/04/2022 Prostate specific antigen (PSA) range: 20 or greater   11/29/2022 -  Chemotherapy   Patient is on Treatment Plan : PROSTATE Docetaxel (75) q21d       ALLERGIES:  is allergic to bacitracin, vancomycin, ketorolac, and spironolactone.  MEDICATIONS:  Current Outpatient Medications  Medication Sig Dispense Refill   apixaban (ELIQUIS) 5 MG TABS tablet Take 1 tablet (5 mg total) by mouth 2 (two) times daily. 60 tablet 3   buPROPion (WELLBUTRIN XL) 300 MG 24 hr tablet Take 300 mg by mouth daily.     darolutamide (NUBEQA) 300 MG tablet Take 600 mg by mouth 2 (two) times daily with a meal.     gabapentin (NEURONTIN) 100 MG capsule Take 1 capsule (100 mg total) by mouth 3 (three) times daily. 90 capsule 1   lidocaine-prilocaine (EMLA) cream Apply 1 Application topically as needed. 30 g 0   lisinopril (ZESTRIL) 20 MG tablet Take 20 mg by mouth 2 (two) times daily.     ondansetron (ZOFRAN) 8 MG tablet Take 1 tablet (8 mg total) by mouth every 8 (eight) hours as needed for nausea or vomiting. 60 tablet 1   oxyCODONE-acetaminophen (PERCOCET/ROXICET) 5-325 MG tablet Take 1 tablet by mouth every 12 (twelve) hours as needed for moderate pain. 60 tablet 0   senna-docusate (SENOKOT-S) 8.6-50 MG tablet Take 2 tablets by mouth 2 (two) times daily as needed for mild constipation. (Patient not taking: Reported on 12/24/2022) 100 tablet 0   No current facility-administered  medications for this visit.    VITAL SIGNS: There were no vitals taken for this visit. There were no vitals filed for this visit.  Estimated body mass index is 60.19 kg/m as calculated from the following:   Height as of 11/29/22: 4\' 10"  (1.473 m).   Weight as of 12/24/22: 288 lb (130.6 kg).  LABS: CBC:    Component Value Date/Time   WBC 5.0 12/24/2022 0948   WBC 7.2 10/01/2022 0501   HGB 11.3 (L) 12/24/2022 0948   HCT 34.2 (L) 12/24/2022 0948   PLT 152 12/24/2022 0948   MCV 93.2 12/24/2022 0948   NEUTROABS 3.7 12/24/2022 0948   LYMPHSABS 0.7 12/24/2022 0948   MONOABS 0.5 12/24/2022 0948   EOSABS 0.0 12/24/2022 0948   BASOSABS 0.0 12/24/2022 0948   Comprehensive Metabolic Panel:    Component Value Date/Time   NA 131 (L) 12/24/2022 0948   K 4.9 12/24/2022 0948   CL 104  12/24/2022 0948   CO2 21 (L) 12/24/2022 0948   BUN 18 12/24/2022 0948   CREATININE 0.72 12/24/2022 0948   GLUCOSE 84 12/24/2022 0948   CALCIUM 8.5 (L) 12/24/2022 0948   AST 19 12/24/2022 0948   ALT 21 12/24/2022 0948   ALKPHOS 143 (H) 12/24/2022 0948   BILITOT 2.0 (H) 12/24/2022 0948   PROT 5.9 (L) 12/24/2022 0948   ALBUMIN 3.3 (L) 12/24/2022 0948    RADIOGRAPHIC STUDIES: No results found.  PERFORMANCE STATUS (ECOG) : 2 - Symptomatic, <50% confined to bed  Review of Systems Unless otherwise noted, a complete review of systems is negative.  Physical Exam General: NAD Cardiovascular: regular rate and rhythm Pulmonary: clear ant fields Abdomen: soft, nontender, + bowel sounds GU: no suprapubic tenderness Extremities: Bilateral lower extremity amputations Skin: Area of erythema on abdomen less red  neurological: Weakness but otherwise nonfocal  IMPRESSION/PLAN: Stage IV prostate cancer -on docetaxel and Firmagon  Visual changes -unclear etiology.  This could be secondary to chemotherapy regimen.  However, will send referral to ophthalmology for dilated eye exam.  Dysphagia -Will obtain MRI  of the brain for further evaluation.  Will send referral to SLP.  Neck pain -spoke with radiology.  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  Will obtain MRI of the cervical spine for further evaluation.  Case and plan discussed with Dr. Orlie Dakin, who is covering for Dr. Donneta Romberg.  Follow-up in 2 weeks with Dr. Donneta Romberg  Patient expressed understanding and was in agreement with this plan. He also understands that He can call clinic at any time with any questions, concerns, or complaints.   Thank you for allowing me to participate in the care of this very pleasant patient.   Time Total: 25 minutes  Visit consisted of counseling and education dealing with the complex and emotionally intense issues of symptom management in the setting of serious illness.Greater than 50%  of this time was spent counseling and coordinating care related to the above assessment and plan.  Signed by: Laurette Schimke, PhD, NP-C

## 2023-01-02 ENCOUNTER — Other Ambulatory Visit: Payer: Self-pay | Admitting: *Deleted

## 2023-01-02 ENCOUNTER — Inpatient Hospital Stay: Payer: Medicare PPO | Admitting: Licensed Clinical Social Worker

## 2023-01-02 ENCOUNTER — Inpatient Hospital Stay: Payer: Medicare PPO

## 2023-01-02 DIAGNOSIS — C7951 Secondary malignant neoplasm of bone: Secondary | ICD-10-CM

## 2023-01-02 DIAGNOSIS — C61 Malignant neoplasm of prostate: Secondary | ICD-10-CM | POA: Diagnosis not present

## 2023-01-02 DIAGNOSIS — Z801 Family history of malignant neoplasm of trachea, bronchus and lung: Secondary | ICD-10-CM | POA: Diagnosis not present

## 2023-01-02 MED ORDER — OXYCODONE-ACETAMINOPHEN 5-325 MG PO TABS: 1.0000 | ORAL_TABLET | Freq: Two times a day (BID) | ORAL | 0 refills | Status: DC | PRN

## 2023-01-02 MED ORDER — DIAZEPAM 5 MG PO TABS: ORAL_TABLET | ORAL | 0 refills | Status: DC

## 2023-01-02 NOTE — Progress Notes (Signed)
REFERRING PROVIDER: Earna Coder, MD [address] [place],  [place] [address]  PRIMARY PROVIDER:  Dione Housekeeper, MD  PRIMARY REASON FOR VISIT:  1. Prostate cancer metastatic to bone (HCC)   2. Family history of lung cancer      HISTORY OF PRESENT ILLNESS:   Mr. [name], a 67 y.o. male, was seen for a [place] cancer genetics consultation at the request of Dr. Donneta Romberg due to a personal history of metastatic prostate cancer.  Mr. Schoening presents to clinic today to discuss the possibility of a hereditary predisposition to cancer, genetic testing, and to further clarify his future cancer risks, as well as potential cancer risks for family members.    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  On the thecal sac.  PSA 700.   Check guardant testing. NO Biopsy. AUG 9th, 2024- PSMA PET - Multifocal intense radiotracer activity within the prostate gland consistent with primary prostate adenocarcinoma; Evidence of nodal metastasis with radiotracer avid tiny lymph nodes in the rectal sheath and periaortic retroperitoneum; Extensive radiotracer avid skeletal metastasis with near confluent lesions within the pelvis. Lesions in the lumbar spine, RIGHT scapula, LEFT humerus. Lesions too numerous to count. No evidence of visceral metastasis or pulmonary metastasis; awaiting guardant testing. On ADT- [started aug 1st, 2024].   # AUG 1st, 2024- Firmagon  --------------------- # CAD; on coumadin; Bil AKA    Prostate cancer metastatic to bone (HCC)  09/30/2022 Initial Diagnosis   Prostate cancer metastatic to bone (HCC)   10/04/2022 Cancer Staging   Staging form: Prostate, AJCC 8th Edition - Clinical: Stage IVB (cTX, cNX, pM1b, PSA: 703) - Signed by Earna Coder, MD on 10/04/2022 Prostate specific antigen (PSA)  range: 20 or greater   11/29/2022 -  Chemotherapy   Patient is on Treatment Plan : PROSTATE Docetaxel (75) q21d      In 2024, at the age of 62, Mr. Doucet was diagnosed with metastatic prostate cancer. The treatment plan includes chemotherapy. 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   Past Medical History:  Diagnosis Date   Depression    History of blood clots 08/13/2011   History of left knee replacement    HTN (hypertension)    Lymphedema    lt leg-per pt   PE (pulmonary thromboembolism) (HCC)    S/P AKA (above knee amputation) bilateral (HCC)    rt 11/24/07 and lt 01/11/17    Past Surgical History:  Procedure Laterality Date   ACHILLES TENDON SURGERY Left    per pt   bone clip removal Left 10/04/2015   knee   CARDIAC SURGERY  11/2012   cath.-per pt   CERVICAL FUSION  07/28/2015   per pt   EPIGASTRIC HERNIA REPAIR     per pt   hematoma removal Left 11/02/2015   knee   HIP SURGERY Right    x8 per pt   IR IMAGING GUIDED PORT INSERTION  11/14/2022   KNEE FUSION Right    KNEE JOINT MANIPULATION Left 09/11/2011   KNEE SURGERY Left    x3   leg stump Left 03/2017   leg surgery-per pt   NECK SURGERY  02/2015   plates-per pt   REPLACEMENT TOTAL KNEE Right    REPLACEMENT TOTAL KNEE Left 07/02/2011   again in 07/10/2011 per pt   s/p  of bilateral AKA Right    screen filter removal  10/20/2008   for embolism per pt   screen filter replacement     x3   SHOULDER SURGERY Right    x2-per pt   SPINE SURGERY  02/11/2015   vertebra spacer-per pt   toe nail removal Right    per pt   VARICOSE VEIN SURGERY     x5-per pt    FAMILY HISTORY:  We obtained a detailed, 4-generation family history.  Significant diagnoses are listed below: Family History  Problem Relation Age of Onset   Alzheimer's disease Mother    Lung cancer Father    Mr. Westermann has 3 sons, 2 daughters living. No known cancers. Mr. Mckiernan has 1 brother, 56, limited  contact.   Mr. Beauchesne mother passed at 59 of Alzheimer's. No known cancers on this side of the family.  Mr. Chrisman father died of lung cancer at 34, he had history of smoking. No other known cancers on this side of the family.  Mr. Saler is unaware of previous family history of genetic testing for hereditary cancer risks. There is no reported Ashkenazi Jewish ancestry. There is no known consanguinity.    GENETIC COUNSELING ASSESSMENT: Mr. Dillow is a 67 y.o. male with a personal history of metastatic prostate cancer which is somewhat suggestive of a hereditary cancer syndrome and predisposition to cancer. We, therefore, discussed and recommended the following at today's visit.   DISCUSSION: We discussed that approximately 10% of prostate cancer is hereditary. Most cases of hereditary prostate cancer are associated with BRCA1/BRCA2 genes, although there are other genes associated with hereditary prostate cancer as well including the ATM gene. Cancers and risks are gene specific. We discussed that testing is beneficial for several reasons including knowing about cancer risks, identifying potential screening and risk-reduction options that may be appropriate, and to understand if other family members could be at risk for cancer and allow them to undergo genetic testing.   We reviewed the characteristics, features and inheritance patterns of hereditary cancer syndromes. We also discussed genetic testing, including the appropriate family members to test, the process of testing, insurance coverage and turn-around-time for results. We discussed the implications of a negative, positive and/or variant of uncertain significant result. We recommended Mr. Carraher pursue genetic testing for the [employer] Common Hereditary Cancers+RNA gene panel.   Based on Mr. Crick personal history of cancer, he meets medical criteria for genetic testing. Despite that he meets criteria, he may still have an out of  pocket cost.   PLAN: After considering the risks, benefits, and limitations, Mr. Lemerise provided informed consent to pursue genetic testing and the blood sample was sent to [facility] for analysis of the Common Hereditary Cancers+RNA panel. Results should be available within approximately 2-3 weeks' time, at which point they will be disclosed by telephone to Mr. Mccracken, as will any additional recommendations warranted by these results. Mr. Stickrod will receive a summary of his genetic counseling visit and a copy of his results once available. This information will also be available in Epic.   Mr. Chicas questions were answered to his satisfaction today. Our contact information was provided should additional questions or concerns arise. Thank you for the referral and allowing [place] to share in the care of your patient.   [name], MS, [facility] Genetic Counselor [email or web address] Phone: [phone]  The patient was seen for a total of 20 minutes in face-to-face genetic counseling.  Patient's friend was also present. Dr. Blake Divine  was available for discussion regarding this case.   _______________________________________________________________________ For Office Staff:  Number of people involved in session: 2 Was an Intern/ student involved with case: no

## 2023-01-03 ENCOUNTER — Ambulatory Visit
Admission: RE | Admit: 2023-01-03 | Discharge: 2023-01-03 | Disposition: A | Discharge status: Home / Self Care | Payer: Medicare PPO | Source: Ambulatory Visit | Attending: Hospice and Palliative Medicine | Admitting: Hospice and Palliative Medicine

## 2023-01-03 DIAGNOSIS — C61 Malignant neoplasm of prostate: Secondary | ICD-10-CM

## 2023-01-03 DIAGNOSIS — C7951 Secondary malignant neoplasm of bone: Secondary | ICD-10-CM | POA: Diagnosis present

## 2023-01-03 MED ORDER — GADOBUTROL 1 MMOL/ML IV SOLN
10.0000 mL | Freq: Once | INTRAVENOUS | Status: AC | PRN
  Administered 2023-01-03: 10 mL via INTRAVENOUS

## 2023-01-04 ENCOUNTER — Encounter: Payer: Self-pay | Admitting: *Deleted

## 2023-01-04 ENCOUNTER — Telehealth: Payer: Self-pay | Admitting: Hospice and Palliative Medicine

## 2023-01-04 DIAGNOSIS — C7951 Secondary malignant neoplasm of bone: Secondary | ICD-10-CM

## 2023-01-04 NOTE — Telephone Encounter (Signed)
I called patient to review results of MRIs.  Patient with multilevel degenerative changes of cervical spine with severe foraminal narrowing bilaterally at C3-C4 and C7-T1.  Discussed with IR and patient may be a candidate for ESI.  Will refer him to the spine center in [place].

## 2023-01-08 ENCOUNTER — Inpatient Hospital Stay: Payer: Medicare PPO | Attending: Internal Medicine | Admitting: Hospice and Palliative Medicine

## 2023-01-08 ENCOUNTER — Ambulatory Visit: Payer: Medicare PPO | Attending: Hospice and Palliative Medicine | Admitting: Speech Pathology

## 2023-01-08 VITALS — BP 104/59 | HR 75 | Temp 96.6°F | Resp 20

## 2023-01-08 DIAGNOSIS — C61 Malignant neoplasm of prostate: Secondary | ICD-10-CM | POA: Diagnosis present

## 2023-01-08 DIAGNOSIS — Z79899 Other long term (current) drug therapy: Secondary | ICD-10-CM | POA: Insufficient documentation

## 2023-01-08 DIAGNOSIS — C7951 Secondary malignant neoplasm of bone: Secondary | ICD-10-CM | POA: Insufficient documentation

## 2023-01-08 DIAGNOSIS — M542 Cervicalgia: Secondary | ICD-10-CM | POA: Diagnosis not present

## 2023-01-08 DIAGNOSIS — Z86718 Personal history of other venous thrombosis and embolism: Secondary | ICD-10-CM | POA: Diagnosis not present

## 2023-01-08 DIAGNOSIS — R1310 Dysphagia, unspecified: Secondary | ICD-10-CM | POA: Insufficient documentation

## 2023-01-08 DIAGNOSIS — Z5189 Encounter for other specified aftercare: Secondary | ICD-10-CM | POA: Insufficient documentation

## 2023-01-08 DIAGNOSIS — Z89612 Acquired absence of left leg above knee: Secondary | ICD-10-CM | POA: Insufficient documentation

## 2023-01-08 DIAGNOSIS — I2510 Atherosclerotic heart disease of native coronary artery without angina pectoris: Secondary | ICD-10-CM | POA: Insufficient documentation

## 2023-01-08 DIAGNOSIS — G546 Phantom limb syndrome with pain: Secondary | ICD-10-CM | POA: Diagnosis not present

## 2023-01-08 DIAGNOSIS — Z89611 Acquired absence of right leg above knee: Secondary | ICD-10-CM | POA: Diagnosis not present

## 2023-01-08 DIAGNOSIS — Z7901 Long term (current) use of anticoagulants: Secondary | ICD-10-CM | POA: Insufficient documentation

## 2023-01-08 DIAGNOSIS — Z801 Family history of malignant neoplasm of trachea, bronchus and lung: Secondary | ICD-10-CM | POA: Insufficient documentation

## 2023-01-08 DIAGNOSIS — R479 Unspecified speech disturbances: Secondary | ICD-10-CM | POA: Insufficient documentation

## 2023-01-08 DIAGNOSIS — L8990 Pressure ulcer of unspecified site, unspecified stage: Secondary | ICD-10-CM

## 2023-01-08 DIAGNOSIS — L89891 Pressure ulcer of other site, stage 1: Secondary | ICD-10-CM | POA: Insufficient documentation

## 2023-01-08 DIAGNOSIS — D709 Neutropenia, unspecified: Secondary | ICD-10-CM | POA: Insufficient documentation

## 2023-01-08 DIAGNOSIS — Z5111 Encounter for antineoplastic chemotherapy: Secondary | ICD-10-CM | POA: Diagnosis present

## 2023-01-08 DIAGNOSIS — Z923 Personal history of irradiation: Secondary | ICD-10-CM | POA: Diagnosis not present

## 2023-01-08 DIAGNOSIS — R1312 Dysphagia, oropharyngeal phase: Secondary | ICD-10-CM | POA: Insufficient documentation

## 2023-01-08 DIAGNOSIS — M47812 Spondylosis without myelopathy or radiculopathy, cervical region: Secondary | ICD-10-CM

## 2023-01-08 NOTE — Progress Notes (Signed)
Symptom Management Clinic [facility] at [facility] [phone] [phone]  Patient Care Team: Dione Housekeeper, MD as PCP - General (Family Medicine) Earna Coder, MD as Consulting Physician (Oncology) Carmina Miller, MD as Consulting Physician (Radiation Oncology)   NAME OF PATIENT: [name]  [id number]  23-Feb-1956   DATE OF VISIT: 01/08/23  REASON FOR CONSULT: Prakash Kimberling is a 67 y.o. 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   INTERVAL HISTORY: Patient received day 1, cycle 2 docetaxel and darolutamide on 12/24/2022.  Patient was seen recently in [facility] with complaints of neck pain, dysphagia and visual changes and drooling.  Was sent for MRI brain and cervical spine.  No acute intracranial abnormalities were noted on MRI of the brain.  However, patient seemed to have several contrast-enhancing lesions of C6, C7, T1, and T2.  Also noted to have moderate to severe spinal canal narrowing at C7-T1 and multilevel neural foraminal narrowing bilaterally at C3-C4 and C7-T1.  Patient subsequently had follow-up this morning with SLP who is conducting ongoing swallowing evaluation.  He presents [facility] today for evaluation of pressure ulcer on his upper L. thigh.  Patient says that he noticed this developing a couple weeks ago. Patient says he has been using a zinc oxide cream.  Has history of lymphedema and left lower extremity.  Denies previous wounds or ulcers.  Denies recent fevers or illnesses. Denies any easy bleeding or bruising. Reports good appetite and denies weight loss. Denies chest pain. Denies any nausea, vomiting, constipation, or diarrhea. Denies urinary complaints. Patient offers no further specific  complaints today.   PAST MEDICAL HISTORY: Past Medical History:  Diagnosis Date   Depression    History of blood clots 08/13/2011   History of left knee replacement    HTN (hypertension)    Lymphedema    lt leg-per pt   PE (pulmonary thromboembolism) (HCC)    S/P AKA (above knee amputation) bilateral (HCC)    rt 11/24/07 and lt 01/11/17    PAST SURGICAL HISTORY:  Past Surgical History:  Procedure Laterality Date   ACHILLES TENDON SURGERY Left    per pt   bone clip removal Left 10/04/2015   knee   CARDIAC SURGERY  11/2012   cath.-per pt   CERVICAL FUSION  07/28/2015   per pt   EPIGASTRIC HERNIA REPAIR     per pt   hematoma removal Left 11/02/2015   knee   HIP SURGERY Right    x8 per pt   IR IMAGING GUIDED PORT INSERTION  11/14/2022   KNEE FUSION Right    KNEE JOINT MANIPULATION Left 09/11/2011   KNEE SURGERY Left    x3   leg stump Left 03/2017   leg surgery-per pt   NECK SURGERY  02/2015   plates-per pt   REPLACEMENT TOTAL KNEE Right    REPLACEMENT TOTAL KNEE Left 07/02/2011   again in 07/10/2011 per pt   s/p of bilateral AKA Right    screen filter removal  10/20/2008   for embolism per pt   screen filter replacement     x3   SHOULDER SURGERY Right    x2-per pt   SPINE SURGERY  02/11/2015   vertebra spacer-per pt   toe nail removal Right    per pt   VARICOSE VEIN SURGERY  x5-per pt    HEMATOLOGY/ONCOLOGY HISTORY:  Oncology History Overview Note  #HIGH RISK/ DE-NOVO CASTRATE SENSITIVE PROSTATE CANCER: CT CAP [JULY 2024]-multiple bone metastases; MRI lumbar spine without contrast  [PCP]-L4-L5 lesion.  On the thecal sac.  PSA 700.   Check guardant testing. NO Biopsy. AUG 9th, 2024- PSMA PET - Multifocal intense radiotracer activity within the prostate gland consistent with primary prostate adenocarcinoma; Evidence of nodal metastasis with radiotracer avid tiny lymph nodes in the rectal sheath and periaortic retroperitoneum; Extensive radiotracer avid skeletal  metastasis with near confluent lesions within the pelvis. Lesions in the lumbar spine, RIGHT scapula, LEFT humerus. Lesions too numerous to count. No evidence of visceral metastasis or pulmonary metastasis; awaiting guardant testing. On ADT- [started aug 1st, 2024].   # AUG 1st, 2024- Firmagon  --------------------- # CAD; on coumadin; Bil AKA    Prostate cancer metastatic to bone (HCC)  09/30/2022 Initial Diagnosis   Prostate cancer metastatic to bone (HCC)   10/04/2022 Cancer Staging   Staging form: Prostate, AJCC 8th Edition - Clinical: Stage IVB (cTX, cNX, pM1b, PSA: 703) - Signed by Earna Coder, MD on 10/04/2022 Prostate specific antigen (PSA) range: 20 or greater   11/29/2022 -  Chemotherapy   Patient is on Treatment Plan : PROSTATE Docetaxel (75) q21d       ALLERGIES:  is allergic to bacitracin, vancomycin, ketorolac, and spironolactone.  MEDICATIONS:  Current Outpatient Medications  Medication Sig Dispense Refill   apixaban (ELIQUIS) 5 MG TABS tablet Take 1 tablet (5 mg total) by mouth 2 (two) times daily. 60 tablet 3   buPROPion (WELLBUTRIN XL) 300 MG 24 hr tablet Take 300 mg by mouth daily.     darolutamide (NUBEQA) 300 MG tablet Take 600 mg by mouth 2 (two) times daily with a meal.     diazepam (VALIUM) 5 MG tablet diazePAM (VALIUM) 5 MG tablet 1-2 tablets 45 minutes before MRI as needed anxiety 2 tablet 0   gabapentin (NEURONTIN) 100 MG capsule Take 1 capsule (100 mg total) by mouth 3 (three) times daily. 90 capsule 1   lidocaine-prilocaine (EMLA) cream Apply 1 Application topically as needed. 30 g 0   lisinopril (ZESTRIL) 20 MG tablet Take 20 mg by mouth 2 (two) times daily.     ondansetron (ZOFRAN) 8 MG tablet Take 1 tablet (8 mg total) by mouth every 8 (eight) hours as needed for nausea or vomiting. 60 tablet 1   oxyCODONE-acetaminophen (PERCOCET/ROXICET) 5-325 MG tablet Take 1 tablet by mouth every 12 (twelve) hours as needed for moderate pain (pain score 4-6).  60 tablet 0   senna-docusate (SENOKOT-S) 8.6-50 MG tablet Take 2 tablets by mouth 2 (two) times daily as needed for mild constipation. (Patient not taking: Reported on 12/24/2022) 100 tablet 0   No current facility-administered medications for this visit.    VITAL SIGNS: There were no vitals taken for this visit. There were no vitals filed for this visit.  Estimated body mass index is 60.19 kg/m as calculated from the following:   Height as of 11/29/22: 4\' 10"  (1.473 m).   Weight as of 12/24/22: 288 lb (130.6 kg).  LABS: CBC:    Component Value Date/Time   WBC 27.4 (H) 01/01/2023 1503   WBC 7.2 10/01/2022 0501   HGB 10.3 (L) 01/01/2023 1503   HCT 31.4 (L) 01/01/2023 1503   PLT 126 (L) 01/01/2023 1503   MCV 93.7 01/01/2023 1503   NEUTROABS 21.8 (H) 01/01/2023 1503   LYMPHSABS 0.7 01/01/2023 1503  MONOABS 1.1 (H) 01/01/2023 1503   EOSABS 0.0 01/01/2023 1503   BASOSABS 0.0 01/01/2023 1503   Comprehensive Metabolic Panel:    Component Value Date/Time   NA 133 (L) 01/01/2023 1502   K 4.9 01/01/2023 1502   CL 102 01/01/2023 1502   CO2 23 01/01/2023 1502   BUN 11 01/01/2023 1502   CREATININE 0.77 01/01/2023 1502   GLUCOSE 131 (H) 01/01/2023 1502   CALCIUM 8.9 01/01/2023 1502   AST 18 01/01/2023 1502   ALT 16 01/01/2023 1502   ALKPHOS 119 01/01/2023 1502   BILITOT 0.8 01/01/2023 1502   PROT 6.2 (L) 01/01/2023 1502   ALBUMIN 3.3 (L) 01/01/2023 1502    RADIOGRAPHIC STUDIES: MR Cervical Spine W Wo Contrast  Result Date: 01/03/2023 CLINICAL DATA:  Metastatic disease evaluation neck pain, weakness; Metastatic disease evaluation visual changes, dysphagia, r/o malignancy EXAM: MRI HEAD WITHOUT AND WITH CONTRAST MRI CERVICAL SPINE WITHOUT AND WITH CONTRAST CONTRAST:  10mL GADAVIST GADOBUTROL 1 MMOL/ML IV SOLN TECHNIQUE: Multiplanar, multiecho pulse sequences of the brain and surrounding structures, and cervical spine were obtained without and with intravenous contrast.  COMPARISON:  None Available. FINDINGS: MRI HEAD FINDINGS Brain: Negative for an acute infarct. No hemorrhage. No hydrocephalus. No extra-axial fluid collection. No mass effect. No mass lesion. No contrast-enhancing lesion. Vascular: Normal flow voids. Skull and upper cervical spine: Normal marrow signal. Sinuses/Orbits: No middle ear or mastoid effusion. Mild mucosal thickening bilateral maxillary sinuses. Orbits are unremarkable. Other: None. MRI CERVICAL SPINE FINDINGS Alignment: Straightening of the normal cervical lordosis. Vertebrae: Postsurgical changes from C6-C7 ACDF. There is T1 hyperintense appearance of the intervertebral discs at the C3-C6 levels. This appears to suppress on the fat saturated sequences and is favored to represent fatty replacement of the intervertebral discs, which could be degenerative in nature. There are contrast-enhancing lesions of the C6, C7, T1, and T2 vertebral bodies, worrisome for osseous metastatic disease. Cord: Normal signal and morphology. Posterior Fossa, vertebral arteries, paraspinal tissues: Negative. Disc levels: C1-C2: Mild degenerative change. C2-C3: Moderate bilateral facet degenerative change. No significant disc bulge. No spinal canal narrowing. Moderate bilateral neural foraminal narrowing. C3-C4: Moderate bilateral facet degenerative change. No significant disc bulge. No spinal canal narrowing. Uncovertebral hypertrophy. Severe bilateral neural foraminal narrowing. Posterior decompression. C4-C5: Moderate bilateral facet degenerative change. Uncovertebral hypertrophy. No significant disc bulge. Moderate bilateral neural foraminal narrowing. No spinal canal narrowing. C5-C6: Uncovertebral hypertrophy. No significant disc bulge. Moderate bilateral facet degenerative change. No spinal canal narrowing. Moderate right and mild left neural foraminal narrowing. C6-C7: Moderate bilateral facet degenerative change. Uncovertebral hypertrophy. No significant disc bulge. No  spinal canal narrowing. Mild right and moderate left neural foraminal narrowing. C7-T1: Moderate bilateral facet degenerative change. Circumferential disc bulge. Moderate to severe spinal canal narrowing. Uncovertebral hypertrophy. Severe bilateral neural foraminal narrowing. IMPRESSION: 1. No acute intracranial abnormality. No evidence of intracranial metastatic disease. 2. Contrast-enhancing lesions at the C6, C7, T1, and T2 vertebral bodies are worrisome for osseous metastatic disease. No evidence of epidural spread of tumor. 3. Multilevel degenerative changes of the cervical spine with moderate to severe spinal canal narrowing at C7-T1. 4. Multilevel neural foraminal narrowing, severe bilaterally at C3-C4 and C7-T1. Electronically Signed   By: Lorenza Cambridge M.D.   On: 01/03/2023 19:37   MR Brain W Wo Contrast  Result Date: 01/03/2023 CLINICAL DATA:  Metastatic disease evaluation neck pain, weakness; Metastatic disease evaluation visual changes, dysphagia, r/o malignancy EXAM: MRI HEAD WITHOUT AND WITH CONTRAST MRI CERVICAL SPINE WITHOUT AND WITH  CONTRAST CONTRAST:  10mL GADAVIST GADOBUTROL 1 MMOL/ML IV SOLN TECHNIQUE: Multiplanar, multiecho pulse sequences of the brain and surrounding structures, and cervical spine were obtained without and with intravenous contrast. COMPARISON:  None Available. FINDINGS: MRI HEAD FINDINGS Brain: Negative for an acute infarct. No hemorrhage. No hydrocephalus. No extra-axial fluid collection. No mass effect. No mass lesion. No contrast-enhancing lesion. Vascular: Normal flow voids. Skull and upper cervical spine: Normal marrow signal. Sinuses/Orbits: No middle ear or mastoid effusion. Mild mucosal thickening bilateral maxillary sinuses. Orbits are unremarkable. Other: None. MRI CERVICAL SPINE FINDINGS Alignment: Straightening of the normal cervical lordosis. Vertebrae: Postsurgical changes from C6-C7 ACDF. There is T1 hyperintense appearance of the intervertebral discs at  the C3-C6 levels. This appears to suppress on the fat saturated sequences and is favored to represent fatty replacement of the intervertebral discs, which could be degenerative in nature. There are contrast-enhancing lesions of the C6, C7, T1, and T2 vertebral bodies, worrisome for osseous metastatic disease. Cord: Normal signal and morphology. Posterior Fossa, vertebral arteries, paraspinal tissues: Negative. Disc levels: C1-C2: Mild degenerative change. C2-C3: Moderate bilateral facet degenerative change. No significant disc bulge. No spinal canal narrowing. Moderate bilateral neural foraminal narrowing. C3-C4: Moderate bilateral facet degenerative change. No significant disc bulge. No spinal canal narrowing. Uncovertebral hypertrophy. Severe bilateral neural foraminal narrowing. Posterior decompression. C4-C5: Moderate bilateral facet degenerative change. Uncovertebral hypertrophy. No significant disc bulge. Moderate bilateral neural foraminal narrowing. No spinal canal narrowing. C5-C6: Uncovertebral hypertrophy. No significant disc bulge. Moderate bilateral facet degenerative change. No spinal canal narrowing. Moderate right and mild left neural foraminal narrowing. C6-C7: Moderate bilateral facet degenerative change. Uncovertebral hypertrophy. No significant disc bulge. No spinal canal narrowing. Mild right and moderate left neural foraminal narrowing. C7-T1: Moderate bilateral facet degenerative change. Circumferential disc bulge. Moderate to severe spinal canal narrowing. Uncovertebral hypertrophy. Severe bilateral neural foraminal narrowing. IMPRESSION: 1. No acute intracranial abnormality. No evidence of intracranial metastatic disease. 2. Contrast-enhancing lesions at the C6, C7, T1, and T2 vertebral bodies are worrisome for osseous metastatic disease. No evidence of epidural spread of tumor. 3. Multilevel degenerative changes of the cervical spine with moderate to severe spinal canal narrowing at C7-T1.  4. Multilevel neural foraminal narrowing, severe bilaterally at C3-C4 and C7-T1. Electronically Signed   By: Lorenza Cambridge M.D.   On: 01/03/2023 19:37    PERFORMANCE STATUS (ECOG) : 2 - Symptomatic, <50% confined to bed  Review of Systems Unless otherwise noted, a complete review of systems is negative.  Physical Exam General: NAD Cardiovascular: regular rate and rhythm Pulmonary: clear ant fields Abdomen: soft, nontender, + bowel sounds GU: no suprapubic tenderness Extremities: Bilateral lower extremity amputations Skin: Area of erythema on abdomen less red  neurological: Weakness but otherwise nonfocal  IMPRESSION/PLAN: Stage IV prostate cancer -on docetaxel and Firmagon  Visual changes -possibly secondary to chemotherapy regimen.  Pending ophthalmology evaluation  Dysphagia -possibly secondary to cervical spinal disease.  Patient evaluated this a.m. by SLP.    Neck pain -previously spoke with radiology.  Multilevel cervical disease per MRI.  Case discussed with Dr. Cherylann Ratel who agreed to see patient in pain clinic for possible thoracic ESI.  Pressure ulcers -stage I ulcer on left upper thigh.  Wound cleaned and Mepilex dressing applied.  Will send referral to wound center. Also refer to home health for in home assessment.   Patient expressed understanding and was in agreement with this plan. He also understands that He can call clinic at any time with any questions, concerns, or  complaints.   Thank you for allowing me to participate in the care of this very pleasant patient.   Time Total: 20 minutes  Visit consisted of counseling and education dealing with the complex and emotionally intense issues of symptom management in the setting of serious illness.Greater than 50%  of this time was spent counseling and coordinating care related to the above assessment and plan.  Signed by: Laurette Schimke, PhD, NP-C

## 2023-01-09 ENCOUNTER — Encounter: Payer: Self-pay | Admitting: Licensed Clinical Social Worker

## 2023-01-09 ENCOUNTER — Telehealth: Payer: Self-pay | Admitting: Licensed Clinical Social Worker

## 2023-01-09 ENCOUNTER — Ambulatory Visit: Payer: Self-pay | Admitting: Licensed Clinical Social Worker

## 2023-01-09 DIAGNOSIS — Z1379 Encounter for other screening for genetic and chromosomal anomalies: Secondary | ICD-10-CM

## 2023-01-09 NOTE — Therapy (Signed)
OUTPATIENT SPEECH LANGUAGE PATHOLOGY  SWALLOW EVALUATION   Patient Name: [name] MRN: [id number] DOB:Nov 25, 1955, 67 y.o., male Today's Date: 01/08/2023  PCP: [name] Housekeeper, MD REFERRING PROVIDER: Laurette Schimke, NP   End of Session - 01/08/23 1457     Visit Number 1    Number of Visits 17    Date for SLP Re-Evaluation 03/05/23    Authorization Type [employer]    Progress Note Due on Visit 10    SLP Start Time 0940    SLP Stop Time  1020    SLP Time Calculation (min) 40 min    Activity Tolerance Patient tolerated treatment well             Past Medical History:  Diagnosis Date   Depression    History of blood clots 08/13/2011   History of left knee replacement    HTN (hypertension)    Lymphedema    lt leg-per pt   PE (pulmonary thromboembolism) (HCC)    S/P AKA (above knee amputation) bilateral (HCC)    rt 11/24/07 and lt 01/11/17   Past Surgical History:  Procedure Laterality Date   ACHILLES TENDON SURGERY Left    per pt   bone clip removal Left 10/04/2015   knee   CARDIAC SURGERY  11/2012   cath.-per pt   CERVICAL FUSION  07/28/2015   per pt   EPIGASTRIC HERNIA REPAIR     per pt   hematoma removal Left 11/02/2015   knee   HIP SURGERY Right    x8 per pt   IR IMAGING GUIDED PORT INSERTION  11/14/2022   KNEE FUSION Right    KNEE JOINT MANIPULATION Left 09/11/2011   KNEE SURGERY Left    x3   leg stump Left 03/2017   leg surgery-per pt   NECK SURGERY  02/2015   plates-per pt   REPLACEMENT TOTAL KNEE Right    REPLACEMENT TOTAL KNEE Left 07/02/2011   again in 07/10/2011 per pt   s/p of bilateral AKA Right    screen filter removal  10/20/2008   for embolism per pt   screen filter replacement     x3   SHOULDER SURGERY Right    x2-per pt   SPINE SURGERY  02/11/2015   vertebra spacer-per pt   toe nail removal Right    per pt   VARICOSE VEIN SURGERY     x5-per pt   Patient Active Problem List   Diagnosis Date Noted   Genetic  testing 01/09/2023   Morbid obesity with BMI of 60.0-69.9, adult (HCC) 10/01/2022   Prostate cancer metastatic to bone (HCC) 09/30/2022   Back pain 09/30/2022   Obesity 09/30/2022   Diarrhea 09/30/2022   Pain from bone metastases (HCC) 09/30/2022   S/P AKA (above knee amputation) bilateral (HCC)    PE (pulmonary thromboembolism) (HCC)    Depression     ONSET DATE: 12/24/2022 initial report of dysphagia   REFERRING DIAG:  C61,C79.51 (ICD-10-CM) - Prostate cancer metastatic to bone (HCC)  R47.9 (ICD-10-CM) - Speech complaints    THERAPY DIAG:  Dysphagia, oropharyngeal phase  Rationale for Evaluation and Treatment Rehabilitation  SUBJECTIVE:   SUBJECTIVE STATEMENT: Pt pleasant, appears to be good historian Pt accompanied by: friend  PERTINENT HISTORY: [name] is a 67 y.o. 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  Patient received day 1, cycle 2 docetaxel and darolutamide on 12/24/2022.   He saw Dr. Donneta Romberg on 12/24/2022 at which time patient was having some difficulty swallowing solids.   DIAGNOSTIC FINDINGS:  MRI Cervical Spine 01/03/2023 1. No acute intracranial abnormality. No evidence of intracranial metastatic disease. 2. Contrast-enhancing lesions at the C6, C7, T1, and T2 vertebral bodies are worrisome for osseous metastatic disease. No evidence of epidural spread of tumor. 3. Multilevel degenerative changes of the cervical spine with moderate to severe spinal canal narrowing at C7-T1. 4. Multilevel neural foraminal narrowing, severe bilaterally at C3-C4 and C7-T1.  MRI Brain 01/03/2023 1. No acute intracranial abnormality. No evidence of intracranial metastatic disease. 2. Contrast-enhancing lesions at the C6, C7, T1, and T2  vertebral bodies are worrisome for osseous metastatic disease. No evidence of epidural spread of tumor. 3. Multilevel degenerative changes of the cervical spine with moderate to severe spinal canal narrowing at C7-T1. 4. Multilevel neural foraminal narrowing, severe bilaterally at C3-C4 and C7-T1.  PAIN:  Are you having pain?  Yes, monitored during session  FALLS: Has patient fallen in last 6 months?  No  LIVING ENVIRONMENT: Lives with: lives alone Lives in: House/apartment  PLOF:  Level of assistance: Independent with ADLs, Independent with IADLs Employment: On disability   PATIENT GOALS  to improve ability to swallow advanced solids  OBJECTIVE:   COGNITION: Overall cognitive status: Within functional limits for tasks assessed  ORAL MOTOR EXAMINATION Facial : WFL Lingual: WFL Velum: WFL Mandible: WFL Cough: WFL Voice: Hoarse   CLINICAL SWALLOW ASSESSMENT:   Current diet: regular and thin liquids Dentition: adequate natural dentition Feeding: able to feed self Consistencies tested: Thin Liquid: Presentation: Cup and Self-fed Oral Phase: WFL Pharyngeal Phase: WFL Regular: Presentation: By hand and Self-fed Oral Phase: WFL Pharyngeal Phase: WFL   Evaluation findings: Patient presents with oropharyngeal swallow which appears clinically to be within functional limits with adequate airway protection. Oral stage is characterized by appearance of adequate oral containment, mastication, bolus formation, oral transfer and oral clearance. Swallow initiation appears timely. No overt signs of aspiration observed despite challenging with consecutive straw sips of thin liquids in excess of 3oz.  Aspiration risk factors:Other: Overall aspiration risk:Mild Diet Recommendations: regular and thin liquids Precautions:Minimize environmental distractions, Slow rate, Small sips/bites, Seated upright 90 degrees, and Remain upright for at least 30 minutes after meals Supervision:  Patient able to feed self Oral care recommendations:Oral care BID Follow-up recommendations: Therapy as outlined in treatment plan below and Defer until completion of instrumental exam     PATIENT REPORTED OUTCOME MEASURES (PROM): To be completed over the next 3 sessions  TODAY'S TREATMENT:  N/A   PATIENT EDUCATION: Education details: results of clinical swallow evaluation, possibility of furhter assessment Person educated: Patient Education method: Explanation Education comprehension: verbalized understanding  HOME EXERCISE PROGRAM:     N/A   GOALS: Goals reviewed with patient? Yes  SHORT TERM GOALS: Target date: 10 sessions  Pt will participate in instrumental swallow study to further evaluate oropharyngeal abilities and establish appropriate therapeutic trials.  Baseline: Goal status: INITIAL   LONG TERM GOALS: Target date: 03/05/2023  Pt will consume least restrictive diet to reduce risk of aspiration, dehydration and malnutrition.  Baseline:  Goal status: INITIAL  2.  Pt will report improvement at discharge on patient reported outcome measure.  Baseline:  Goal status: INITIAL   ASSESSMENT:  CLINICAL IMPRESSION: Patient is a 67 y.o. male who was seen today for a clinical swallow evaluation. While  pt doesn't have any overt s/s of aspiration when consuming a chewy granola bar, he reports that following and has to cease consumption as a result. Pt describes his "jaw gives up, it cna't chew anymore, feels short of getting a cramp, back of throat (where his tonsils are) getting tight"  OBJECTIVE IMPAIRMENTS include dysphagia. These impairments are limiting patient from safety when swallowing. Factors affecting potential to achieve goals and functional outcome are medical prognosis. Patient will benefit from skilled SLP services to address above impairments and improve overall function.  REHAB POTENTIAL: Good  PLAN: SLP FREQUENCY: 1-2x/week  SLP DURATION: 8  weeks  PLANNED INTERVENTIONS: Aspiration precaution training, Diet toleration management , Trials of upgraded texture/liquids, SLP instruction and feedback, and Patient/family education   Oanh Devivo B. Dreama Saa, M.S., CCC-SLP, Tree surgeon Certified Brain Injury Specialist [facility]  [facility] Office [phone] Ascom [phone] Fax [phone]

## 2023-01-09 NOTE — Progress Notes (Signed)
HPI:   [name] was previously seen in the [facility] due to a personal and family history of cancer and concerns regarding a hereditary predisposition to cancer. Please refer to our prior cancer genetics clinic note for more information regarding our discussion, assessment and recommendations, at the time. [name] recent genetic test results were disclosed to him, as were recommendations warranted by these results. These results and recommendations are discussed in more detail below.  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  On the thecal sac.  PSA 700.   Check guardant testing. NO Biopsy. AUG 9th, 2024- PSMA PET - Multifocal intense radiotracer activity within the prostate gland consistent with primary prostate adenocarcinoma; Evidence of nodal metastasis with radiotracer avid tiny lymph nodes in the rectal sheath and periaortic retroperitoneum; Extensive radiotracer avid skeletal metastasis with near confluent lesions within the pelvis. Lesions in the lumbar spine, RIGHT scapula, LEFT humerus. Lesions too numerous to count. No evidence of visceral metastasis or pulmonary metastasis; awaiting guardant testing. On ADT- [started aug 1st, 2024].   # AUG 1st, 2024- Firmagon  --------------------- # CAD; on coumadin; Bil AKA    Prostate cancer metastatic to bone (HCC)  09/30/2022 Initial Diagnosis   Prostate cancer metastatic to bone (HCC)   10/04/2022 Cancer Staging   Staging form: Prostate, AJCC 8th Edition - Clinical: Stage IVB (cTX, cNX, pM1b, PSA: 703) - Signed by Earna Coder, MD on 10/04/2022 Prostate specific antigen (PSA) range: 20 or greater   11/29/2022 -  Chemotherapy   Patient is on Treatment Plan : PROSTATE Docetaxel (75) q21d      Genetic Testing   Negative genetic testing. No  pathogenic variants identified on the [facility] Common Hereditary Cancers+RNA panel. The report date is 01/09/2023.  The Common Hereditary Cancers Panel + RNA offered by [facility] includes sequencing and/or deletion duplication testing of the following 48 genes: APC*, ATM*, AXIN2, BAP1, BARD1, BMPR1A, BRCA1, BRCA2, BRIP1, CDH1, CDK4, CDKN2A (p14ARF), CDKN2A (p16INK4a), CHEK2, CTNNA1, DICER1*, EPCAM*, FH*, GREM1*, HOXB13, KIT, MBD4, MEN1*, MLH1*, MSH2*, MSH3*, MSH6*, MUTYH, NF1*, NTHL1, PALB2, PDGFRA, PMS2*, POLD1*, POLE, PTEN*, RAD51C, RAD51D, SDHA*, SDHB, SDHC*, SDHD, SMAD4, SMARCA4, STK11, TP53, TSC1*, TSC2, VHL.      FAMILY HISTORY:  We obtained a detailed, 4-generation family history.  Significant diagnoses are listed below: Family History  Problem Relation Age of Onset   Alzheimer's disease Mother    Lung cancer Father      [name] has 3 sons, 2 daughters living. No known cancers. [name] has 1 brother, 80, limited contact.    [name] mother passed at 48 of Alzheimer's. No known cancers on this side of the family.   [name] father died of lung cancer at 35, he had history of smoking. No other known cancers on this side of the family.   [name] is unaware of previous family history of genetic testing for hereditary cancer risks. There is no reported Ashkenazi Jewish ancestry. There is no known consanguinity.    GENETIC TEST RESULTS:  The [facility] Common Hereditary Cancers+RNA Panel found no pathogenic mutations.   The Common Hereditary Cancers Panel + RNA offered by [facility] includes sequencing and/or deletion duplication testing of the following 48 genes: APC*, ATM*, AXIN2, BAP1, BARD1, BMPR1A, BRCA1, BRCA2, BRIP1, CDH1, CDK4, CDKN2A (p14ARF), CDKN2A (p16INK4a), CHEK2, CTNNA1, DICER1*, EPCAM*, FH*, GREM1*, HOXB13,  KIT, MBD4, MEN1*, MLH1*, MSH2*, MSH3*, MSH6*, MUTYH, NF1*, NTHL1, PALB2, PDGFRA, PMS2*, POLD1*, POLE, PTEN*, RAD51C, RAD51D, SDHA*, SDHB, SDHC*, SDHD,  SMAD4, SMARCA4, STK11, TP53, TSC1*, TSC2, VHL.   The test report has been scanned into EPIC and is located under the Molecular Pathology section of the Results Review tab.  A portion of the result report is included below for reference. Genetic testing reported out on 01/09/2023.      Even though a pathogenic variant was not identified, possible explanations for the cancer in the family may include: There may be no hereditary risk for cancer in the family. The cancers in [name] and/or his family may be sporadic/familial or due to other genetic and environmental factors. There may be a gene mutation in one of these genes that current testing methods cannot detect but that chance is small. There could be another gene that has not yet been discovered, or that we have not yet tested, that is responsible for the cancer diagnoses in the family.  It is also possible there is a hereditary cause for the cancer in the family that [name] did not inherit. Therefore, it is important to remain in touch with cancer genetics in the future so that we can continue to offer [name] the most up to date genetic testing.   ADDITIONAL GENETIC TESTING:  We discussed with [name] that his genetic testing was fairly extensive.  If there are additional relevant genes identified to increase cancer risk that can be analyzed in the future, we would be happy to discuss and coordinate this testing at that time.    CANCER SCREENING RECOMMENDATIONS:  [name] test result is considered negative (normal).  This means that we have not identified a hereditary cause for his personal and family history of cancer at this time.   An individual's cancer risk and medical management are not determined by genetic test results alone. Overall cancer risk assessment incorporates additional factors, including personal medical history, family history, and any available genetic information that may result in a personalized  plan for cancer prevention and surveillance. Therefore, it is recommended he continue to follow the cancer management and screening guidelines provided by his oncology and primary healthcare provider.  RECOMMENDATIONS FOR FAMILY MEMBERS:   Since he did not inherit a identifiable mutation in a cancer predisposition gene included on this panel, his children could not have inherited a known mutation from him in one of these genes. Individuals in this family might be at some increased risk of developing cancer, over the general population risk, due to the family history of cancer.  Individuals in the family should notify their providers of the family history of cancer. We recommend women in this family have a yearly mammogram beginning at age 26, or 59 years younger than the earliest onset of cancer, an annual clinical breast exam, and perform monthly breast self-exams.  Family members should have colonoscopies by at age 80, or earlier, as recommended by their providers.   FOLLOW-UP:  Lastly, we discussed with [name] that cancer genetics is a rapidly advancing field and it is possible that new genetic tests will be appropriate for him and/or his family members in the future. We encouraged him to remain in contact with cancer genetics on an annual basis so we can update his personal and family histories and let him know of advances in cancer genetics that may benefit this family.   Our contact number was provided. [name] questions were  answered to his satisfaction, and he knows he is welcome to call us at anytime with additional questions or concerns.    [name], MS, [facility] Genetic Counselor [email or web address] Phone: [phone]

## 2023-01-09 NOTE — Telephone Encounter (Signed)
I contacted [name] to discuss his genetic testing results. No pathogenic variants were identified in the 48 genes analyzed. Detailed clinic note to follow.   The test report has been scanned into EPIC and is located under the Molecular Pathology section of the Results Review tab.  A portion of the result report is included below for reference.      [name], MS, [facility] Genetic Counselor [name] Phone: [phone]

## 2023-01-10 ENCOUNTER — Ambulatory Visit
Admission: RE | Admit: 2023-01-10 | Discharge: 2023-01-10 | Disposition: A | Discharge status: Home / Self Care | Payer: Medicare PPO | Source: Ambulatory Visit | Attending: Radiation Oncology | Admitting: Radiation Oncology

## 2023-01-10 ENCOUNTER — Encounter: Payer: Self-pay | Admitting: Radiation Oncology

## 2023-01-10 VITALS — BP 102/62 | HR 78

## 2023-01-10 DIAGNOSIS — C61 Malignant neoplasm of prostate: Secondary | ICD-10-CM | POA: Insufficient documentation

## 2023-01-10 DIAGNOSIS — C7951 Secondary malignant neoplasm of bone: Secondary | ICD-10-CM | POA: Insufficient documentation

## 2023-01-10 NOTE — Progress Notes (Signed)
Radiation Oncology Follow up Note  Name: [name]   Date:   01/10/2023 MRN:  [id number] DOB: 05-19-1955    This 67 y.o. male presents to the clinic today for 1 month follow-up status post palliative radiation therapy to his pelvis and patient with widespread metastatic disease from prostate cancer.  REFERRING PROVIDER: Dione Housekeeper, *  HPI: The patient, a 67 year old with stage four castrate sensitive prostate cancer, presents one month after completing palliative radiation therapy to the pelvis. He is currently on docetaxel and Firmagon. He reports persistent pain in the pelvis and a 'horrible' burning sensation in the upper neck. The neck pain is so severe that he expresses a desire to 'throw myself off a roof.' He denies any relief from the pain and is seeking potential interventions such as cortisone shots.  The patient also reports intermittent bowel discomfort, which he describes as 'really hurting.' He is unsure if this is related to his cancer or a symptom of aging. He expresses concern about the progression of his cancer, particularly given the numerous spots that have been identified. He is currently on systemic treatments and is aware that his condition is unlikely to improve.  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 He is seeking referral for further management of this issue..  COMPLICATIONS OF TREATMENT: none  FOLLOW UP COMPLIANCE: keeps appointments   PHYSICAL EXAM:  BP 102/62   Pulse 78  Patient has bilateral lower extremity amputation.  Well-developed well-nourished patient in NAD. HEENT reveals PERLA, EOMI, discs not visualized.  Oral cavity is clear. No oral mucosal lesions are identified. Neck is clear without evidence of cervical or supraclavicular adenopathy. Lungs are clear to A&P. Cardiac examination is essentially  unremarkable with regular rate and rhythm without murmur rub or thrill. Abdomen is benign with no organomegaly or masses noted. Motor sensory and DTR levels are equal and symmetric in the upper and lower extremities. Cranial nerves II through XII are grossly intact. Proprioception is intact. No peripheral adenopathy or edema is identified. No motor or sensory levels are noted. Crude visual fields are within normal range.  RADIOLOGY RESULTS: No current films for review  PLAN: Present time I will turn follow-up care over to medical oncology be happy to reevaluate the patient in time should further palliative treatment be indicated.  We are circling back to medical oncology and symptom management.  He was supposed to see someone for his cervical spine pain.  Patient is to call with any concerns in the future.  I would like to take this opportunity to thank you for allowing me to participate in the care of your patient.Carmina Miller, MD

## 2023-01-11 ENCOUNTER — Encounter: Payer: Self-pay | Admitting: Internal Medicine

## 2023-01-14 ENCOUNTER — Inpatient Hospital Stay (HOSPITAL_BASED_OUTPATIENT_CLINIC_OR_DEPARTMENT_OTHER): Payer: Medicare PPO | Admitting: Internal Medicine

## 2023-01-14 ENCOUNTER — Telehealth: Payer: Self-pay | Admitting: *Deleted

## 2023-01-14 ENCOUNTER — Inpatient Hospital Stay: Payer: Medicare PPO

## 2023-01-14 VITALS — BP 112/72 | HR 68

## 2023-01-14 VITALS — BP 93/61 | HR 72 | Temp 98.2°F | Wt 288.0 lb

## 2023-01-14 DIAGNOSIS — C61 Malignant neoplasm of prostate: Secondary | ICD-10-CM

## 2023-01-14 DIAGNOSIS — C7951 Secondary malignant neoplasm of bone: Secondary | ICD-10-CM | POA: Diagnosis not present

## 2023-01-14 DIAGNOSIS — Z5111 Encounter for antineoplastic chemotherapy: Secondary | ICD-10-CM | POA: Diagnosis not present

## 2023-01-14 LAB — CBC WITH DIFFERENTIAL (CANCER CENTER ONLY)
Abs Immature Granulocytes: 0.04 10*3/uL (ref 0.00–0.07)
Basophils Absolute: 0.1 10*3/uL (ref 0.0–0.1)
Basophils Relative: 1 %
Eosinophils Absolute: 0 10*3/uL (ref 0.0–0.5)
Eosinophils Relative: 0 %
HCT: 28 % — ABNORMAL LOW (ref 39.0–52.0)
Hemoglobin: 9.3 g/dL — ABNORMAL LOW (ref 13.0–17.0)
Immature Granulocytes: 1 %
Lymphocytes Relative: 6 %
Lymphs Abs: 0.3 10*3/uL — ABNORMAL LOW (ref 0.7–4.0)
MCH: 31 pg (ref 26.0–34.0)
MCHC: 33.2 g/dL (ref 30.0–36.0)
MCV: 93.3 fL (ref 80.0–100.0)
Monocytes Absolute: 0.6 10*3/uL (ref 0.1–1.0)
Monocytes Relative: 12 %
Neutro Abs: 3.5 10*3/uL (ref 1.7–7.7)
Neutrophils Relative %: 80 %
Platelet Count: 161 10*3/uL (ref 150–400)
RBC: 3 MIL/uL — ABNORMAL LOW (ref 4.22–5.81)
RDW: 15.7 % — ABNORMAL HIGH (ref 11.5–15.5)
WBC Count: 4.4 10*3/uL (ref 4.0–10.5)
nRBC: 0 % (ref 0.0–0.2)

## 2023-01-14 LAB — CMP (CANCER CENTER ONLY)
ALT: 14 U/L (ref 0–44)
AST: 13 U/L — ABNORMAL LOW (ref 15–41)
Albumin: 3.2 g/dL — ABNORMAL LOW (ref 3.5–5.0)
Alkaline Phosphatase: 96 U/L (ref 38–126)
Anion gap: 6 (ref 5–15)
BUN: 17 mg/dL (ref 8–23)
CO2: 21 mmol/L — ABNORMAL LOW (ref 22–32)
Calcium: 8.4 mg/dL — ABNORMAL LOW (ref 8.9–10.3)
Chloride: 107 mmol/L (ref 98–111)
Creatinine: 0.89 mg/dL (ref 0.61–1.24)
GFR, Estimated: >60 mL/min (ref >60)
Glucose, Bld: 88 mg/dL (ref 70–99)
Potassium: 4.4 mmol/L (ref 3.5–5.1)
Sodium: 134 mmol/L — ABNORMAL LOW (ref 135–145)
Total Bilirubin: 1.9 mg/dL — ABNORMAL HIGH (ref <1.2)
Total Protein: 5.7 g/dL — ABNORMAL LOW (ref 6.5–8.1)

## 2023-01-14 LAB — PSA: Prostatic Specific Antigen: 31.63 ng/mL — ABNORMAL HIGH (ref 0.00–4.00)

## 2023-01-14 MED ORDER — SODIUM CHLORIDE 0.9% FLUSH
10.0000 mL | INTRAVENOUS | Status: DC | PRN
  Administered 2023-01-14: 10 mL
  Filled 2023-01-14: qty 10

## 2023-01-14 MED ORDER — SODIUM CHLORIDE 0.9 % IV SOLN
Freq: Once | INTRAVENOUS | Status: AC
  Filled 2023-01-14: qty 250

## 2023-01-14 MED ORDER — DOCETAXEL CHEMO INJECTION 160 MG/16ML
60.0000 mg/m2 | Freq: Once | INTRAVENOUS | Status: AC
  Administered 2023-01-14: 139 mg via INTRAVENOUS
  Filled 2023-01-14: qty 13.9

## 2023-01-14 MED ORDER — HEPARIN SOD (PORK) LOCK FLUSH 100 UNIT/ML IV SOLN
500.0000 [IU] | Freq: Once | INTRAVENOUS | Status: AC | PRN
Start: 2023-01-14 — End: 2023-01-14
  Administered 2023-01-14: 500 [IU]
  Filled 2023-01-14: qty 5

## 2023-01-14 MED ORDER — DEXAMETHASONE SODIUM PHOSPHATE 10 MG/ML IJ SOLN
10.0000 mg | Freq: Once | INTRAMUSCULAR | Status: AC
  Administered 2023-01-14: 10 mg via INTRAVENOUS
  Filled 2023-01-14: qty 1

## 2023-01-14 NOTE — Assessment & Plan Note (Addendum)
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  On the thecal sac. Pre treatment : PSA 700. AUG 9th, 2024- PSMA PET - Multifocal intense radiotracer activity within the prostate gland consistent with primary prostate adenocarcinoma; Evidence of nodal metastasis with radiotracer avid tiny lymph nodes in the rectal sheath and periaortic retroperitoneum; Extensive radiotracer avid skeletal metastasis with near confluent lesions within the pelvis. Lesions in the lumbar spine, RIGHT scapula, LEFT humerus. Lesions too numerous to count. No evidence of visceral metastasis or pulmonary metastasis; AUG 2024-guardant testing- ATM [eligible for PARPi]. On ADT- [started aug 1st, 2024]. PLAN: ADT + Taxotere+ Darolutamide   # Proceed with cycle #3  of Taxotere [at 60 mg/m2 dose] . Labs-CBC/chemistries were reviewed with the patient slightly elevated Bili sec to [employer] syndrome. With Growth factor-  # Neutropenia cellulitis needing antibiotics.  Secondary chemotherapy adequate factor.  # Neck pain-/Back pain pain medication: Patient currently on oxycodone 1 BID; gabapentin- refilled prescription.  S/p RT-  Dr. Pablo Ledger RT10/03/2022. MRI Neck-metastatic disease in vertebral bodies; no epidural disease. Marland Kitchen  Recommend- evaluation for injection.   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  # IV access: s/p mediport-   # Decub ulcer- awaiting wound care evaluation/ home health evaluation.Deborra Medina transitioned to eligard q6 M [02/03/23]  # DISPOSITION: # Taxotere today  # tomorrow- injection # Follow up in [facility]- Virutal visit-  # follow up in 3 weeks- MD; labs- cbc/cmp;PSA; Taxotere-; D2- injection; Eligard- Dr.B

## 2023-01-14 NOTE — Patient Instructions (Signed)
[facility] - A DEPT OF [facility]  Discharge Instructions: Thank you for choosing [facility] to provide your oncology and hematology care.  If you have a lab appointment with the Cancer Center, please go directly to the Cancer Center and check in at the registration area.  Wear comfortable clothing and clothing appropriate for easy access to any Portacath or PICC line.   We strive to give you quality time with your provider. You may need to reschedule your appointment if you arrive late (15 or more minutes).  Arriving late affects you and other patients whose appointments are after yours.  Also, if you miss three or more appointments without notifying the office, you may be dismissed from the clinic at the provider's discretion.      For prescription refill requests, have your pharmacy contact our office and allow 72 hours for refills to be completed.    Today you received the following chemotherapy and/or immunotherapy agents- taxotere       To help prevent nausea and vomiting after your treatment, we encourage you to take your nausea medication as directed.  BELOW ARE SYMPTOMS THAT SHOULD BE REPORTED IMMEDIATELY: *FEVER GREATER THAN 100.4 F (38 C) OR HIGHER *CHILLS OR SWEATING *NAUSEA AND VOMITING THAT IS NOT CONTROLLED WITH YOUR NAUSEA MEDICATION *UNUSUAL SHORTNESS OF BREATH *UNUSUAL BRUISING OR BLEEDING *URINARY PROBLEMS (pain or burning when urinating, or frequent urination) *BOWEL PROBLEMS (unusual diarrhea, constipation, pain near the anus) TENDERNESS IN MOUTH AND THROAT WITH OR WITHOUT PRESENCE OF ULCERS (sore throat, sores in mouth, or a toothache) UNUSUAL RASH, SWELLING OR PAIN  UNUSUAL VAGINAL DISCHARGE OR ITCHING   Items with * indicate a potential emergency and should be followed up as soon as possible or go to the Emergency Department if any problems should occur.  Please show the CHEMOTHERAPY ALERT CARD or IMMUNOTHERAPY  ALERT CARD at check-in to the Emergency Department and triage nurse.  Should you have questions after your visit or need to cancel or reschedule your appointment, please contact [facility] - A DEPT OF [facility]  [phone] and follow the prompts.  Office hours are 8:00 a.m. to 4:30 p.m. Monday - Friday. Please note that voicemails left after 4:00 p.m. may not be returned until the following business day.  We are closed weekends and major holidays. You have access to a nurse at all times for urgent questions. Please call the main number to the clinic [phone] and follow the prompts.  For any non-urgent questions, you may also contact your provider using MyChart. We now offer e-Visits for anyone 88 and older to request care online for non-urgent symptoms. For details visit [email or web address].   Also download the MyChart app! Go to the app store, search "MyChart", open the app, select [place], and log in with your MyChart username and password.

## 2023-01-14 NOTE — Telephone Encounter (Signed)
I spoke with Barbara Cower at [facility] to facilitate [facility] referral.

## 2023-01-14 NOTE — Progress Notes (Signed)
[facility] OFFICE PROGRESS NOTE  Patient Care Team: Zada Finders Joycie Peek, MD as PCP - General (Family Medicine) Earna Coder, MD as Consulting Physician (Oncology) Carmina Miller, MD as Consulting Physician (Radiation Oncology)   Cancer Staging  Prostate cancer metastatic to bone [facility]) Staging form: Prostate, AJCC 8th Edition - Clinical: Stage IVB (cTX, cNX, pM1b, PSA: 703) - Signed by Earna Coder, MD on 10/04/2022 Prostate specific antigen (PSA) range: 20 or greater    Oncology History Overview Note  #HIGH RISK/ DE-NOVO CASTRATE SENSITIVE PROSTATE CANCER: CT CAP [JULY 2024]-multiple bone metastases; MRI lumbar spine without contrast  [PCP]-L4-L5 lesion.  On the thecal sac.  PSA 700.   Check guardant testing. NO Biopsy. AUG 9th, 2024- PSMA PET - Multifocal intense radiotracer activity within the prostate gland consistent with primary prostate adenocarcinoma; Evidence of nodal metastasis with radiotracer avid tiny lymph nodes in the rectal sheath and periaortic retroperitoneum; Extensive radiotracer avid skeletal metastasis with near confluent lesions within the pelvis. Lesions in the lumbar spine, RIGHT scapula, LEFT humerus. Lesions too numerous to count. No evidence of visceral metastasis or pulmonary metastasis; awaiting guardant testing. On ADT- [started aug 1st, 2024].   # AUG 1st, 2024- Firmagon  --------------------- # CAD; on coumadin; Bil AKA    Prostate cancer metastatic to bone (HCC)  09/30/2022 Initial Diagnosis   Prostate cancer metastatic to bone (HCC)   10/04/2022 Cancer Staging   Staging form: Prostate, AJCC 8th Edition - Clinical: Stage IVB (cTX, cNX, pM1b, PSA: 703) - Signed by Earna Coder, MD on 10/04/2022 Prostate specific antigen (PSA) range: 20 or greater   11/29/2022 -  Chemotherapy   Patient is on Treatment Plan : PROSTATE Docetaxel (75) q21d      Genetic Testing   Negative genetic testing. No pathogenic variants  identified on the [facility] Common Hereditary Cancers+RNA panel. The report date is 01/09/2023.  The Common Hereditary Cancers Panel + RNA offered by [facility] includes sequencing and/or deletion duplication testing of the following 48 genes: APC*, ATM*, AXIN2, BAP1, BARD1, BMPR1A, BRCA1, BRCA2, BRIP1, CDH1, CDK4, CDKN2A (p14ARF), CDKN2A (p16INK4a), CHEK2, CTNNA1, DICER1*, EPCAM*, FH*, GREM1*, HOXB13, KIT, MBD4, MEN1*, MLH1*, MSH2*, MSH3*, MSH6*, MUTYH, NF1*, NTHL1, PALB2, PDGFRA, PMS2*, POLD1*, POLE, PTEN*, RAD51C, RAD51D, SDHA*, SDHB, SDHC*, SDHD, SMAD4, SMARCA4, STK11, TP53, TSC1*, TSC2, VHL.       HISTORY OF PRESENT ILLNESS: Patient ambulating-with wheel chair/AKA.  Patient is accompanied by friend.  [name] 67 y.o.  male pleasant patient with a medical history significant of s/p of bilateral AKA, PE on Coumadin, HTN, depression with  castrate sensitive metastatic prostate cancer currently on ADT+Darolutamide+Taxoetere chemotherapy is here for a follow up.  In the interim patient noted to have worsening neck pain status post evaluation with symptom management clinic.  Awaiting pain management evaluation  Patient s/p radiation to lower back-currently improved taking oxycodone 1 pill twice a day.  Continues to be on gabapentin.  Denies any bladder or bowel incontinence.    Review of Systems  Constitutional:  Positive for malaise/fatigue. Negative for chills, diaphoresis, fever and weight loss.  HENT:  Negative for nosebleeds and sore throat.   Eyes:  Negative for double vision.  Respiratory:  Negative for cough, hemoptysis, sputum production, shortness of breath and wheezing.   Cardiovascular:  Positive for leg swelling. Negative for chest pain, palpitations and orthopnea.  Gastrointestinal:  Negative for abdominal pain, blood in stool, constipation, diarrhea, heartburn, melena, nausea and vomiting.  Genitourinary:  Negative for dysuria, frequency  and urgency.  Musculoskeletal:  Positive  for back pain, joint pain and myalgias.  Skin: Negative.  Negative for itching and rash.  Neurological:  Negative for dizziness, tingling, focal weakness, weakness and headaches.  Endo/Heme/Allergies:  Does not bruise/bleed easily.  Psychiatric/Behavioral:  Negative for depression. The patient is not nervous/anxious and does not have insomnia.       PAST MEDICAL HISTORY :  Past Medical History:  Diagnosis Date   Depression    History of blood clots 08/13/2011   History of left knee replacement    HTN (hypertension)    Lymphedema    lt leg-per pt   PE (pulmonary thromboembolism) (HCC)    S/P AKA (above knee amputation) bilateral (HCC)    rt 11/24/07 and lt 01/11/17    PAST SURGICAL HISTORY :   Past Surgical History:  Procedure Laterality Date   ACHILLES TENDON SURGERY Left    per pt   bone clip removal Left 10/04/2015   knee   CARDIAC SURGERY  11/2012   cath.-per pt   CERVICAL FUSION  07/28/2015   per pt   EPIGASTRIC HERNIA REPAIR     per pt   hematoma removal Left 11/02/2015   knee   HIP SURGERY Right    x8 per pt   IR IMAGING GUIDED PORT INSERTION  11/14/2022   KNEE FUSION Right    KNEE JOINT MANIPULATION Left 09/11/2011   KNEE SURGERY Left    x3   leg stump Left 03/2017   leg surgery-per pt   NECK SURGERY  02/2015   plates-per pt   REPLACEMENT TOTAL KNEE Right    REPLACEMENT TOTAL KNEE Left 07/02/2011   again in 07/10/2011 per pt   s/p of bilateral AKA Right    screen filter removal  10/20/2008   for embolism per pt   screen filter replacement     x3   SHOULDER SURGERY Right    x2-per pt   SPINE SURGERY  02/11/2015   vertebra spacer-per pt   toe nail removal Right    per pt   VARICOSE VEIN SURGERY     x5-per pt    FAMILY HISTORY :   Family History  Problem Relation Age of Onset   Alzheimer's disease Mother    Lung cancer Father     SOCIAL HISTORY:   Social History   Tobacco Use   Smoking status: Never   Smokeless tobacco: Former  Haematologist status: Never Used  Substance Use Topics   Alcohol use: Yes    Alcohol/week: 3.0 standard drinks of alcohol    Types: 3 Glasses of wine per week   Drug use: Never    ALLERGIES:  is allergic to bacitracin, vancomycin, ketorolac, and spironolactone.  MEDICATIONS:  Current Outpatient Medications  Medication Sig Dispense Refill   apixaban (ELIQUIS) 5 MG TABS tablet Take 1 tablet (5 mg total) by mouth 2 (two) times daily. 60 tablet 3   buPROPion (WELLBUTRIN XL) 300 MG 24 hr tablet Take 300 mg by mouth daily.     darolutamide (NUBEQA) 300 MG tablet Take 600 mg by mouth 2 (two) times daily with a meal.     gabapentin (NEURONTIN) 100 MG capsule Take 1 capsule (100 mg total) by mouth 3 (three) times daily. 90 capsule 1   lidocaine-prilocaine (EMLA) cream Apply 1 Application topically as needed. 30 g 0   ondansetron (ZOFRAN) 8 MG tablet Take 1 tablet (8 mg total) by mouth every 8 (  eight) hours as needed for nausea or vomiting. 60 tablet 1   oxyCODONE-acetaminophen (PERCOCET/ROXICET) 5-325 MG tablet Take 1 tablet by mouth every 12 (twelve) hours as needed for moderate pain (pain score 4-6). 60 tablet 0   senna-docusate (SENOKOT-S) 8.6-50 MG tablet Take 2 tablets by mouth 2 (two) times daily as needed for mild constipation. 100 tablet 0   lisinopril (ZESTRIL) 20 MG tablet Take 20 mg by mouth 2 (two) times daily.     No current facility-administered medications for this visit.   Facility-Administered Medications Ordered in Other Visits  Medication Dose Route Frequency Provider Last Rate Last Admin   0.9 %  sodium chloride infusion   Intravenous Once Earna Coder, MD       dexamethasone (DECADRON) injection 10 mg  10 mg Intravenous Once Earna Coder, MD       DOCEtaxel (TAXOTERE) 139 mg in sodium chloride 0.9 % 250 mL chemo infusion  60 mg/m2 (Treatment Plan Recorded) Intravenous Once Earna Coder, MD       heparin lock flush 100 unit/mL  500 Units Intracatheter  Once PRN Earna Coder, MD       sodium chloride flush (NS) 0.9 % injection 10 mL  10 mL Intracatheter PRN Earna Coder, MD        PHYSICAL EXAMINATION:   BP 93/61 (BP Location: Left Arm, Patient Position: Sitting, Cuff Size: Large)   Pulse 72   Temp 98.2 F (36.8 C) (Tympanic)   Wt 288 lb (130.6 kg) Comment: per pt  SpO2 100%   BMI 60.19 kg/m   Filed Weights   01/14/23 0832  Weight: 288 lb (130.6 kg)      Patient has bilateral AKA.   Physical Exam Vitals and nursing note reviewed.  HENT:     Head: Normocephalic and atraumatic.     Mouth/Throat:     Pharynx: Oropharynx is clear.  Eyes:     Extraocular Movements: Extraocular movements intact.     Pupils: Pupils are equal, round, and reactive to light.  Cardiovascular:     Rate and Rhythm: Normal rate and regular rhythm.  Pulmonary:     Comments: Decreased breath sounds bilaterally.  Abdominal:     Palpations: Abdomen is soft.  Musculoskeletal:        General: Normal range of motion.     Cervical back: Normal range of motion.  Skin:    General: Skin is warm.  Neurological:     General: No focal deficit present.     Mental Status: He is alert and oriented to person, place, and time.  Psychiatric:        Behavior: Behavior normal.        Judgment: Judgment normal.      LABORATORY DATA:  I have reviewed the data as listed    Component Value Date/Time   NA 134 (L) 01/14/2023 0811   K 4.4 01/14/2023 0811   CL 107 01/14/2023 0811   CO2 21 (L) 01/14/2023 0811   GLUCOSE 88 01/14/2023 0811   BUN 17 01/14/2023 0811   CREATININE 0.89 01/14/2023 0811   CALCIUM 8.4 (L) 01/14/2023 0811   PROT 5.7 (L) 01/14/2023 0811   ALBUMIN 3.2 (L) 01/14/2023 0811   AST 13 (L) 01/14/2023 0811   ALT 14 01/14/2023 0811   ALKPHOS 96 01/14/2023 0811   BILITOT 1.9 (H) 01/14/2023 0811   GFRNONAA >60 01/14/2023 0811   GFRAA >60 10/19/2016 1335    No results found for: "  SPEP", "UPEP"  Lab Results  Component  Value Date   WBC 4.4 01/14/2023   NEUTROABS 3.5 01/14/2023   HGB 9.3 (L) 01/14/2023   HCT 28.0 (L) 01/14/2023   MCV 93.3 01/14/2023   PLT 161 01/14/2023      Chemistry      Component Value Date/Time   NA 134 (L) 01/14/2023 0811   K 4.4 01/14/2023 0811   CL 107 01/14/2023 0811   CO2 21 (L) 01/14/2023 0811   BUN 17 01/14/2023 0811   CREATININE 0.89 01/14/2023 0811      Component Value Date/Time   CALCIUM 8.4 (L) 01/14/2023 0811   ALKPHOS 96 01/14/2023 0811   AST 13 (L) 01/14/2023 0811   ALT 14 01/14/2023 0811   BILITOT 1.9 (H) 01/14/2023 0811       RADIOGRAPHIC STUDIES: I have personally reviewed the radiological images as listed and agreed with the findings in the report. No results found.   ASSESSMENT & PLAN:  Prostate cancer metastatic to bone (HCC) #HIGH RISK/ DE-NOVO CASTRATE SENSITIVE PROSTATE CANCER: CT CAP [JULY 2024]-multiple bone metastases; MRI lumbar spine without contrast  [PCP]-L4-L5 lesion.  On the thecal sac. Pre treatment : PSA 700. AUG 9th, 2024- PSMA PET - Multifocal intense radiotracer activity within the prostate gland consistent with primary prostate adenocarcinoma; Evidence of nodal metastasis with radiotracer avid tiny lymph nodes in the rectal sheath and periaortic retroperitoneum; Extensive radiotracer avid skeletal metastasis with near confluent lesions within the pelvis. Lesions in the lumbar spine, RIGHT scapula, LEFT humerus. Lesions too numerous to count. No evidence of visceral metastasis or pulmonary metastasis; AUG 2024-guardant testing- ATM [eligible for PARPi]. On ADT- [started aug 1st, 2024]. PLAN: ADT + Taxotere+ Darolutamide   # Proceed with cycle #3  of Taxotere [at 60 mg/m2 dose] . Labs-CBC/chemistries were reviewed with the patient slightly elevated Bili sec to [employer] syndrome. With Growth factor-  # Neutropenia cellulitis needing antibiotics.  Secondary chemotherapy adequate factor.  # Neck pain-/Back pain pain medication: Patient  currently on oxycodone 1 BID; gabapentin- refilled prescription.  S/p RT-  Dr. Pablo Ledger RT10/03/2022. MRI Neck-metastatic disease in vertebral bodies; no epidural disease. Marland Kitchen  Recommend- evaluation for injection.   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  # IV access: s/p mediport-   # Decub ulcer- awaiting wound care evaluation/ home health evaluation.Deborra Medina transitioned to eligard q6 M [02/03/23]  # DISPOSITION: # Taxotere today  # tomorrow- injection # Follow up in [facility]- Virutal visit-  # follow up in 3 weeks- MD; labs- cbc/cmp;PSA; Taxotere-; D2- injection; Eligard- Dr.B     Orders Placed This Encounter  Procedures   CBC with Differential (Cancer Center Only)    Standing Status:   Future    Standing Expiration Date:   02/03/2024   CMP (Cancer Center only)    Standing Status:   Future    Standing Expiration Date:   02/03/2024   PSA    Standing Status:   Future    Standing Expiration Date:   01/14/2024   All questions were answered. The patient knows to call the clinic with any problems, questions or concerns.      Earna Coder, MD 01/14/2023 9:23 AM

## 2023-01-14 NOTE — Progress Notes (Signed)
Bed sore lt thigh, 10/10. Sees wound care next Monday.  Go over CT neck results.  Is there any progress with the chemo/treatments?

## 2023-01-14 NOTE — Telephone Encounter (Signed)
Per Barbara Cower at [facility] will see this patient

## 2023-01-15 ENCOUNTER — Inpatient Hospital Stay: Payer: Medicare PPO

## 2023-01-15 ENCOUNTER — Encounter: Payer: Self-pay | Admitting: *Deleted

## 2023-01-15 DIAGNOSIS — C61 Malignant neoplasm of prostate: Secondary | ICD-10-CM

## 2023-01-15 DIAGNOSIS — Z5111 Encounter for antineoplastic chemotherapy: Secondary | ICD-10-CM | POA: Diagnosis not present

## 2023-01-15 MED ORDER — PEGFILGRASTIM-JMDB 6 MG/0.6ML ~~LOC~~ SOSY
6.0000 mg | PREFILLED_SYRINGE | Freq: Once | SUBCUTANEOUS | Status: AC
  Administered 2023-01-15: 6 mg via SUBCUTANEOUS
  Filled 2023-01-15: qty 0.6

## 2023-01-16 ENCOUNTER — Ambulatory Visit: Payer: Medicare PPO | Admitting: Speech Pathology

## 2023-01-16 ENCOUNTER — Other Ambulatory Visit: Payer: Self-pay | Admitting: Internal Medicine

## 2023-01-16 ENCOUNTER — Telehealth: Payer: Self-pay | Admitting: *Deleted

## 2023-01-16 DIAGNOSIS — C61 Malignant neoplasm of prostate: Secondary | ICD-10-CM

## 2023-01-16 NOTE — Telephone Encounter (Signed)
Patient called stating he was told to call and ask to speak with Marcelino Duster as he is in need for some financial assistance. I explained to him that Marcelino Duster is out of the office today and asked if I could help, he explained that [facility] hjad suggested he call Marcelino Duster and maybe she can get in touch with Ezra Sites I and I asked if he had spoken with [name] our social Worker and he said no. I have entered a SW referral.

## 2023-01-17 ENCOUNTER — Inpatient Hospital Stay: Payer: Medicare PPO | Admitting: Hospice and Palliative Medicine

## 2023-01-17 ENCOUNTER — Encounter: Payer: Self-pay | Admitting: Internal Medicine

## 2023-01-17 ENCOUNTER — Inpatient Hospital Stay: Payer: Medicare PPO

## 2023-01-17 ENCOUNTER — Telehealth: Payer: Self-pay | Admitting: *Deleted

## 2023-01-17 DIAGNOSIS — Z515 Encounter for palliative care: Secondary | ICD-10-CM

## 2023-01-17 DIAGNOSIS — C7951 Secondary malignant neoplasm of bone: Secondary | ICD-10-CM

## 2023-01-17 DIAGNOSIS — C61 Malignant neoplasm of prostate: Secondary | ICD-10-CM

## 2023-01-17 DIAGNOSIS — G893 Neoplasm related pain (acute) (chronic): Secondary | ICD-10-CM

## 2023-01-17 DIAGNOSIS — L8990 Pressure ulcer of unspecified site, unspecified stage: Secondary | ICD-10-CM

## 2023-01-17 MED ORDER — GABAPENTIN 300 MG PO CAPS
300.0000 mg | ORAL_CAPSULE | Freq: Three times a day (TID) | ORAL | 3 refills | Status: DC
Start: 2023-01-17 — End: 2023-02-12

## 2023-01-17 NOTE — Progress Notes (Signed)
Virtual Visit via Telephone Note  I connected with [name] on 01/17/23 at  2:45 PM EST by telephone and verified that I am speaking with the correct person using two identifiers.  Location: Patient: Home Provider: Clinic   I discussed the limitations, risks, security and privacy concerns of performing an evaluation and management service by telephone and the availability of in person appointments. I also discussed with the patient that there may be a patient responsible charge related to this service. The patient expressed understanding and agreed to proceed.   History of Present Illness: [name] is a 67 y.o. 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   Observations/Objective: I called and spoke with patient by phone.  Patient says that he has had a rough week with worsening phantom limb pain.  He has been taking gabapentin without significant improvement.  Tolerating gabapentin well so we will increase the dose in an attempt to alleviate patient's pain.  Patient is still waiting to hear from the pain clinic about possible ESI to cervical spine.  Patient also endorses worsening of his thigh wounds.  He has not yet heard from home health (referral sent to [facility]) the patient is scheduled to see wound clinic on Monday.  Assessment and Plan: Stage IV prostate cancer -on docetaxel and Firmagon  Phantom limb pain -increase gabapentin 300 mg 3 times daily  Thigh Ulcers - pending evaluation at wound clinic. Will follow up with home health regarding pending referral.   Neoplasm-related pain - patient pending referral to the pain clinic for consideration of ESI.  Continue as needed Percocet  Follow Up Instructions: Follow up telephone visit 3 weeks   I discussed the assessment and treatment plan with the patient. The patient was provided an opportunity to ask questions and all were answered. The patient agreed with the plan and  demonstrated an understanding of the instructions.   The patient was advised to call back or seek an in-person evaluation if the symptoms worsen or if the condition fails to improve as anticipated.  I provided 15 minutes of non-face-to-face time during this encounter.   Malachy Moan, NP

## 2023-01-17 NOTE — Progress Notes (Signed)
[facility] CSW Progress Note  Clinical Child psychotherapist contacted patient by phone to assess financial concerns.  Patient does not qualify for the [facility].  He reports receiving medical bills from [facility].  Provided him with the [employer] phone number and Financial Navigator contact information.  Encouraged him to contact DSS for possible food stamps.    Dorothey Baseman, LCSW Clinical Social Worker [facility]

## 2023-01-17 NOTE — Telephone Encounter (Signed)
Downloaded [employer] Patient [employer] Form. Started form. Will pass form to Dallie Piles to finish Financials on form.

## 2023-01-18 ENCOUNTER — Encounter: Payer: Self-pay | Admitting: *Deleted

## 2023-01-21 ENCOUNTER — Telehealth: Payer: Self-pay

## 2023-01-21 ENCOUNTER — Telehealth: Payer: Medicare PPO | Admitting: Nurse Practitioner

## 2023-01-21 ENCOUNTER — Encounter: Payer: Medicare PPO | Attending: Physician Assistant | Admitting: Physician Assistant

## 2023-01-21 DIAGNOSIS — Z7901 Long term (current) use of anticoagulants: Secondary | ICD-10-CM | POA: Insufficient documentation

## 2023-01-21 DIAGNOSIS — Z8546 Personal history of malignant neoplasm of prostate: Secondary | ICD-10-CM | POA: Diagnosis not present

## 2023-01-21 DIAGNOSIS — I739 Peripheral vascular disease, unspecified: Secondary | ICD-10-CM | POA: Diagnosis not present

## 2023-01-21 DIAGNOSIS — I10 Essential (primary) hypertension: Secondary | ICD-10-CM | POA: Diagnosis not present

## 2023-01-21 DIAGNOSIS — Z89612 Acquired absence of left leg above knee: Secondary | ICD-10-CM | POA: Insufficient documentation

## 2023-01-21 DIAGNOSIS — Z89611 Acquired absence of right leg above knee: Secondary | ICD-10-CM | POA: Diagnosis not present

## 2023-01-21 DIAGNOSIS — L89323 Pressure ulcer of left buttock, stage 3: Secondary | ICD-10-CM | POA: Diagnosis present

## 2023-01-21 DIAGNOSIS — Z86718 Personal history of other venous thrombosis and embolism: Secondary | ICD-10-CM | POA: Insufficient documentation

## 2023-01-21 NOTE — Progress Notes (Signed)
[name], [name] ([id number]) [phone] [id number] Page 1 of 5 Visit Report for 01/21/2023 Abuse Risk Screen Details Patient Name: Date of Service: [name], [place] 01/21/2023 9:30 A M Medical Record Number: [id number] Patient Account Number: [id number] Date of Birth/Sex: Treating RN: 10-07-1955 (67 y.o. [name]) Yevonne Pax Primary Care Jesyka Slaght: Rolin Barry Other Clinician: Referring Kortlyn Koltz: Treating Frazer [place]/Extender: Shelva Majestic in Treatment: 0 Abuse Risk Screen Items Answer ABUSE RISK SCREEN: Has anyone close to you tried to hurt or harm you recentlyo No Do you feel uncomfortable with anyone in your familyo No Has anyone forced you do things that you didnt want to doo No Electronic Signature(s) Signed: 01/21/2023 3:51:45 PM By: Yevonne Pax RN Entered By: Yevonne Pax on 01/21/2023 07:13:42 -------------------------------------------------------------------------------- Activities of Daily Living Details Patient Name: Date of Service: [name] Sukh[place] 01/21/2023 9:30 A M Medical Record Number: [id number] Patient Account Number: [id number] Date of Birth/Sex: Treating RN: 05-15-1955 (67 y.o. [name]) Yevonne Pax Primary Care Jaycee Pelzer: Rolin Barry Other Clinician: Referring Maliik Karner: Treating Sereena Marando/Extender: Kris Mouton Weeks in Treatment: 0 Activities of Daily Living Items Answer Activities of Daily Living (Please select one for each item) Drive Automobile Not Able T Medications ake Completely Able Use T elephone Completely Able Care for Appearance Completely Able Use T oilet Completely Able Bath / Shower Completely Able Dress Self Completely Able Feed Self Completely Able Walk Not Able Get In / Out Bed Completely Able Housework Need Assistance [place], [name] ([id number]) [phone] [email or web address] Page 2 of 5 Prepare Meals Completely Able Handle Money Completely Able Shop for Self Need  Assistance Electronic Signature(s) Signed: 01/21/2023 3:51:45 PM By: Yevonne Pax RN Entered By: Yevonne Pax on 01/21/2023 07:14:40 -------------------------------------------------------------------------------- Education Screening Details Patient Name: Date of Service: [name], [name] 01/21/2023 9:30 A M Medical Record Number: [id number] Patient Account Number: [id number] Date of Birth/Sex: Treating RN: April 17, 1955 (67 y.o. [name]) Yevonne Pax Primary Care Surya Schroeter: Rolin Barry Other Clinician: Referring Adebayo Ensminger: Treating Flornce Record/Extender: Shelva Majestic in Treatment: 0 Primary Learner Assessed: Patient Learning Preferences/Education Level/Primary Language Learning Preference: Explanation Highest Education Level: College or Above Preferred Language: English Cognitive Barrier Language Barrier: No Translator Needed: No Memory Deficit: No Emotional Barrier: No Cultural/Religious Beliefs Affecting Medical Care: No Physical Barrier Impaired Vision: Yes Glasses Impaired Hearing: No Decreased Hand dexterity: No Knowledge/Comprehension Knowledge Level: Medium Comprehension Level: High Ability to understand written instructions: High Ability to understand verbal instructions: High Motivation Anxiety Level: Anxious Cooperation: Cooperative Education Importance: Acknowledges Need Interest in Health Problems: Asks Questions Perception: Coherent Willingness to Engage in Self-Management High Activities: Readiness to Engage in Self-Management High Activities: Electronic Signature(s) Signed: 01/21/2023 3:51:45 PM By: Yevonne Pax RN Entered By: Yevonne Pax on 01/21/2023 07:15:07 Lattin, Rosie ([id number]) [id number] Page 3 of 5 -------------------------------------------------------------------------------- Fall Risk Assessment Details Patient Name: Date of Service: [name], [name] 01/21/2023 9:30 A M Medical Record Number:  [id number] Patient Account Number: [id number] Date of Birth/Sex: Treating RN: January 24, 1956 (67 y.o. [name]) Yevonne Pax Primary Care Kalanie Fewell: Rolin Barry Other Clinician: Referring Arsal Tappan: Treating Rex Magee/Extender: Shelva Majestic in Treatment: 0 Fall Risk Assessment Items Have you had 2 or more falls in the last 12 monthso 0 No Have you had any fall that resulted in injury in the last 12 monthso 0 No FALLS RISK SCREEN History of falling - immediate or within 3 months 0 No Secondary diagnosis (Do you have 2 or more medical diagnoseso) 0 No Ambulatory aid None/bed rest/wheelchair/nurse 0 Yes Crutches/cane/walker 0 No Furniture  0 No Intravenous therapy Access/Saline/Heparin Lock 0 No Gait/Transferring Normal/ bed rest/ wheelchair 0 Yes Weak (short steps with or without shuffle, stooped but able to lift head while walking, may seek 0 No support from furniture) Impaired (short steps with shuffle, may have difficulty arising from chair, head down, impaired 0 No balance) Mental Status Oriented to own ability 0 Yes Electronic Signature(s) Signed: 01/21/2023 3:51:45 PM By: Yevonne Pax RN Entered By: Yevonne Pax on 01/21/2023 07:15:15 -------------------------------------------------------------------------------- Foot Assessment Details Patient Name: Date of Service: [name], [name] 01/21/2023 9:30 A M Medical Record Number: [id number] Patient Account Number: [id number] Date of Birth/Sex: Treating RN: [birth year] (67 y.o. 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 Site Locations [place], Rembert ([id number]) [id number] Page 4 of 5 + = Sensation present, - = Sensation  absent, C = Callus, U = Ulcer R = Redness, W = Warmth, M = Maceration, PU = Pre-ulcerative lesion F = Fissure, S = Swelling, D = Dryness Assessment Right: Left: Other Deformity: Prior Foot Ulcer: Prior Amputation: Charcot Joint: Ambulatory Status: Non-ambulatory Assistance Device: Wheelchair Gait: Electronic Signature(s) Signed: 01/21/2023 3:51:45 PM By: Yevonne Pax RN Entered By: Yevonne Pax on 01/21/2023 07:15:45 -------------------------------------------------------------------------------- Nutrition Risk Screening Details Patient Name: Date of Service: [name], [place] 01/21/2023 9:30 A M Medical Record Number: [id number] Patient Account Number: [id number] Date of Birth/Sex: Treating RN: 04-Jul-1955 (67 y.o. [name]) Yevonne Pax Primary Care Lakeita Panther: Rolin Barry Other Clinician: Referring Laina Guerrieri: Treating Chavis Tessler/Extender: Kris Mouton Weeks in Treatment: 0 Height (in): 59 Weight (lbs): 288 Body Mass Index (BMI): 58.2 Nutrition Risk Screening Items Score Screening NUTRITION RISK SCREEN: I have an illness or condition that made me change the kind and/or amount of food I eat 0 No I eat fewer than two meals per day 0 No I eat few fruits and vegetables, or milk products 0 No I have three or more drinks of beer, liquor or wine almost every day 0 No I have tooth or mouth problems that make it hard for me to eat 0 No Sheets, Kemo ([id number]) [id number] Page 5 of 5 I don't always have enough money to buy the food I need 0 No I eat alone most of the time 0 No I take three or more different prescribed or over-the-counter drugs a day 1 Yes Without wanting to, I have lost or gained 10 pounds in the last six months 0 No I am not always physically able to shop, cook and/or feed myself 0 No Nutrition Protocols Good Risk Protocol 0 No interventions needed Moderate Risk Protocol High Risk Proctocol Risk Level: Good Risk Score:  1 Electronic Signature(s) Signed: 01/21/2023 3:51:45 PM By: Yevonne Pax RN Entered By: Yevonne Pax on 01/21/2023 07:15:27

## 2023-01-21 NOTE — Telephone Encounter (Signed)
Called and left VM for patient to return my call to set up time to obtain signature for PAP Re-Enrollment. I will continue to reach out to patient. I will continue to follow and update until final determination.    Ardeen Fillers, CPhT Oncology Pharmacy Patient Advocate  [facility]  [phone] (phone) [phone] (fax) 01/21/2023 11:44 AM

## 2023-01-21 NOTE — Telephone Encounter (Signed)
Oral Oncology Patient Advocate Encounter   Received notification that patient is due for re-enrollment for assistance for [employer] through [employer] [place] [employer].   Re-enrollment process has been initiated and will be submitted upon completion of necessary documents.  [employer]'s phone number [phone].   I will continue to follow until final determination.   Ardeen Fillers, CPhT Oncology Pharmacy Patient Advocate  [facility]  [phone] (phone) [phone] (fax) 01/21/2023 10:39 AM

## 2023-01-21 NOTE — Progress Notes (Signed)
[name], [name] ([id number]) [id number] Page 1 of 9 Visit Report for 01/21/2023 Allergy List Details Patient Name: Date of Service: [name], [name] 01/21/2023 9:30 A M Medical Record Number: [id number] Patient Account Number: [id number] Date of Birth/Sex: Treating RN: 11-09-1955 (67 y.o. [name]) [name] Primary Care [name]: [name] Other Clinician: Referring [name]: Treating [name]/Extender: [name] Weeks in Treatment: 0 Allergies Active Allergies bacitracin vancomycin spironolactone ketorolac Allergy Notes Electronic Signature(s) Signed: 01/21/2023 3:51:45 PM By: [name] Pax RN Entered By: [name] on 01/21/2023 07:12:17 -------------------------------------------------------------------------------- Arrival Information Details Patient Name: Date of Service: [name], [name] 01/21/2023 9:30 A M Medical Record Number: [id number] Patient Account Number: [id number] Date of Birth/Sex: Treating RN: 11-12-1955 (67 y.o. [name]) [name] Primary Care Kiari Hosmer: [name] Other Clinician: Referring [name]: Treating [name]/Extender: [name] in Treatment: 0 Visit Information Patient Arrived: Wheel Chair Arrival Time: 10:08 Accompanied By: friend Transfer Assistance: None Patient Identification Verified: Yes Secondary Verification Process Completed: Yes Patient Requires Transmission-Based Precautions: No Patient Has Alerts: Yes Patient Alerts: Patient on Blood Thinner [name] ([id number]) Patient Alerts: Patient on Blood Thinner History Since Last Visit Added or deleted any medications: No Any new allergies or adverse reactions: No Had a fall or experienced change in activities of daily living that may affect risk of falls: No Signs or symptoms of abuse/neglect since last visito No Hospitalized since last visit: No Implantable device outside of the clinic excluding cellular tissue  based products placed in the center since last visit: No [id number] Page 2 of 9 Has Dressing in Place as Prescribed: Yes Pain Present Now: Yes Electronic Signature(s) Signed: 01/21/2023 3:51:45 PM By: [name] Pax RN Entered By: [name] on 01/21/2023 07:09:10 -------------------------------------------------------------------------------- Clinic Level of Care Assessment Details Patient Name: Date of Service: [name], [name] 01/21/2023 9:30 A M Medical Record Number: [id number] Patient Account Number: [id number] Date of Birth/Sex: Treating RN: 1955/04/16 (67 y.o. [name]) [name] Primary Care Vung Kush: [name] Other Clinician: Referring Maranda Marte: Treating Kriston Pasquarello/Extender: [name] in Treatment: 0 Clinic Level of Care Assessment Items TOOL 1 Quantity Score X- 1 0 Use when EandM and Procedure is performed on INITIAL visit ASSESSMENTS - Nursing Assessment / Reassessment X- 1 20 General Physical Exam (combine w/ comprehensive assessment (listed just below) when performed on new pt. evals) X- 1 25 Comprehensive Assessment (HX, ROS, Risk Assessments, Wounds Hx, etc.) ASSESSMENTS - Wound and Skin Assessment / Reassessment []  - 0 Dermatologic / Skin Assessment (not related to wound area) ASSESSMENTS - Ostomy and/or Continence Assessment and Care []  - 0 Incontinence Assessment and Management []  - 0 Ostomy Care Assessment and Management (repouching, etc.) PROCESS - Coordination of Care X - Simple Patient / Family Education for ongoing care 1 15 []  - 0 Complex (extensive) Patient / Family Education for ongoing care []  - 0 Staff obtains Chiropractor, Records, T Results / Process Orders est []  - 0 Staff telephones HHA, Nursing Homes / Clarify orders / etc []  - 0 Routine Transfer to another Facility (non-emergent condition) []  - 0 Routine Hospital Admission (non-emergent condition) X- 1 15 New Admissions / Ambulance person / Ordering NPWT Apligraf, etc. , []  - 0 Emergency Hospital Admission (emergent condition) PROCESS - Special Needs []  - 0 Pediatric / Minor Patient Management []  - 0 Isolation Patient Management []  - 0 Hearing / Language / Visual special needs []  - 0 Assessment of Community assistance (transportation, D/C planning, etc.) []  - 0 Additional assistance / Altered mentation []  -  0 Support Surface(s) Assessment (bed, cushion, seat, etc.) [name], [name] ([id number]) [phone].pdf Page 3 of 9 INTERVENTIONS - Miscellaneous []  - 0 External ear exam []  - 0 Patient Transfer (multiple staff / Nurse, adult / Similar devices) []  - 0 Simple Staple / Suture removal (25 or less) []  - 0 Complex Staple / Suture removal (26 or more) []  - 0 Hypo/Hyperglycemic Management (do not check if billed separately) []  - 0 Ankle / Brachial Index (ABI) - do not check if billed separately Has the patient been seen at the hospital within the last three years: Yes Total Score: 75 Level Of Care: New/Established - Level 2 Electronic Signature(s) Signed: 01/21/2023 3:51:45 PM By: [name] Pax RN Entered By: [name] on 01/21/2023 08:17:16 -------------------------------------------------------------------------------- Encounter Discharge Information Details Patient Name: Date of Service: [name], [name] 01/21/2023 9:30 A M Medical Record Number: [id number] Patient Account Number: [id number] Date of Birth/Sex: Treating RN: June 12, 1955 (67 y.o. [name]) [name] Primary Care Qamar Rosman: [name] Other Clinician: Referring Kalysta Kneisley: Treating Jaanvi Fizer/Extender: [name] in Treatment: 0 Encounter Discharge Information Items Post Procedure Vitals Discharge Condition: Stable Temperature (F): 99.2 Ambulatory Status: Wheelchair Pulse (bpm): 90 Discharge Destination: Home Respiratory Rate (breaths/min): 18 Transportation: Private Auto Blood Pressure  (mmHg): 151/80 Accompanied By: self Schedule Follow-up Appointment: Yes Clinical Summary of Care: Electronic Signature(s) Signed: 01/21/2023 11:20:11 AM By: [name] Pax RN Entered By: [name] on 01/21/2023 08:20:11 -------------------------------------------------------------------------------- Lower Extremity Assessment Details Patient Name: Date of Service: [name], [name] 01/21/2023 9:30 A M Medical Record Number: [id number] Patient Account Number: [id number] Date of Birth/Sex: Treating RN: 05/15/1955 (67 y.o. [name]) Orbie, Picha, Ladarrell ([id number]) [phone].pdf Page 4 of 9 Primary Care Jordyan Hardiman: [name] Other Clinician: Referring Dio Giller: Treating Jessyka Austria/Extender: [name] in Treatment: 0 Electronic Signature(s) Signed: 01/21/2023 3:51:45 PM By: [name] Pax RN Entered By: [name] on 01/21/2023 07:29:33 -------------------------------------------------------------------------------- Multi Wound Chart Details Patient Name: Date of Service: [name], [name] 01/21/2023 9:30 A M Medical Record Number: [id number] Patient Account Number: [id number] Date of Birth/Sex: Treating RN: [birth year] (67 y.o. [name]) [name] Primary Care Yulia Ulrich: [name] Other Clinician: Referring Chriss Mannan: Treating Miliana Gangwer/Extender: [name] in Treatment: 0 Vital Signs Height(in): 59 Pulse(bpm): 90 Weight(lbs): 288 Blood Pressure(mmHg): 151/80 Body Mass Index(BMI): 58.2 Temperature(F): 99.2 Respiratory Rate(breaths/min): 18 [4:Photos:] [N/A:N/A] Left Gluteus N/A N/A Wound Location: Pressure Injury N/A N/A Wounding Event: Pressure Ulcer N/A N/A Primary Etiology: Deep Vein Thrombosis, Peripheral N/A N/A Comorbid History: Venous Disease, Neuropathy, Received Chemotherapy, Received Radiation 01/04/2023 N/A N/A Date Acquired: 0 N/A N/A Weeks of Treatment: Open N/A N/A Wound Status: No N/A  N/A Wound Recurrence: 4x2.5x0.1 N/A N/A Measurements L x W x D (cm) 7.854 N/A N/A A (cm) : rea 0.785 N/A N/A Volume (cm) : Category/Stage III N/A N/A Classification: Medium N/A N/A Exudate A mount: Serosanguineous N/A N/A Exudate Type: red, brown N/A N/A Exudate Color: Medium (34-66%) N/A N/A Granulation A mount: Pink N/A N/A Granulation Quality: Medium (34-66%) N/A N/A Necrotic A mount: Fat Layer (Subcutaneous Tissue): Yes N/A N/A Exposed Structures: Fascia: No Tendon: No Muscle: No Joint: No Bone: No None N/A N/A Epithelialization: [place], Jvon ([id number]) [id number] Page 5 of 9 Treatment Notes Electronic Signature(s) Signed: 01/21/2023 3:51:45 PM By: [name] Pax RN Entered By: [name] on 01/21/2023 07:49:29 -------------------------------------------------------------------------------- Multi-Disciplinary Care Plan Details Patient Name: Date of Service: [name], [name] 01/21/2023 9:30 A M Medical Record Number: [id number] Patient Account Number: [id number] Date of Birth/Sex: Treating RN:  11-29-1955 (67 y.o. Melonie [place] Primary Care Ahniyah Giancola: [name] Other Clinician: Referring Shaneequa Bahner: Treating Avrey Hyser/Extender: [name] in Treatment: 0 Active Inactive Necrotic Tissue Nursing Diagnoses: Knowledge deficit related to management of necrotic/devitalized tissue Goals: Patient/caregiver will verbalize understanding of reason and process for debridement of necrotic tissue Date Initiated: 01/21/2023 Target Resolution Date: 02/20/2023 Goal Status: Active Interventions: Assess patient pain level pre-, during and post procedure and prior to discharge Notes: Wound/Skin Impairment Nursing Diagnoses: Knowledge deficit related to ulceration/compromised skin integrity Goals: Patient/caregiver will verbalize understanding of skin care regimen Date Initiated: 01/21/2023 Target Resolution Date:  02/20/2023 Goal Status: Active Ulcer/skin breakdown will have a volume reduction of 30% by week 4 Date Initiated: 01/21/2023 Target Resolution Date: 02/20/2023 Goal Status: Active Ulcer/skin breakdown will have a volume reduction of 50% by week 8 Date Initiated: 01/21/2023 Target Resolution Date: 03/23/2023 Goal Status: Active Ulcer/skin breakdown will have a volume reduction of 80% by week 12 Date Initiated: 01/21/2023 Target Resolution Date: 04/23/2023 Goal Status: Active Ulcer/skin breakdown will heal within 14 weeks Date Initiated: 01/21/2023 Target Resolution Date: 05/21/2023 Goal Status: Active Interventions: Assess patient/caregiver ability to obtain necessary supplies Assess patient/caregiver ability to perform ulcer/skin care regimen upon admission and as needed Assess ulceration(s) every visit Brisbon, Abdulhamid ([id number]) [id number] Page 6 of 9 Notes: Electronic Signature(s) Signed: 01/21/2023 3:51:45 PM By: [name] Pax RN Entered By: [name] on 01/21/2023 07:52:35 -------------------------------------------------------------------------------- Pain Assessment Details Patient Name: Date of Service: [name], [name] 01/21/2023 9:30 A M Medical Record Number: [id number] Patient Account Number: [id number] Date of Birth/Sex: Treating RN: 1955/05/26 (67 y.o. [name]) [name] Primary Care Lior Hoen: [name] Other Clinician: Referring Jennel Mara: Treating Nichlas Pitera/Extender: [name] in Treatment: 0 Active Problems Location of Pain Severity and Description of Pain Patient Has Paino Yes Site Locations With Dressing Change: Yes Duration of the Pain. Constant / Intermittento Intermittent How Long Does it Lasto Hours: Minutes: 15 Rate the pain. Current Pain Level: 8 Worst Pain Level: 8 Least Pain Level: 4 Tolerable Pain Level: 5 Character of Pain Describe the Pain: Aching, Burning Pain Management and  Medication Current Pain Management: Medication: Yes Cold Application: No Rest: Yes Massage: No Activity: No T.E.N.S.: No Heat Application: No Leg drop or elevation: No Is the Current Pain Management Adequate: Inadequate How does your wound impact your activities of daily livingo Sleep: Yes Bathing: No Appetite: Yes Relationship With Others: No Bladder Continence: No Emotions: No Bowel Continence: No Work: No Toileting: No Drive: No Dressing: No Hobbies: No Electronic Signature(s) Signed: 01/21/2023 3:51:45 PM By: [name] Pax RN [name], Abhimanyu ([id number]) [id number] Page 7 of 9 Entered By: [name] on 01/21/2023 07:10:22 -------------------------------------------------------------------------------- Patient/Caregiver Education Details Patient Name: Date of Service: [name], [name] 11/18/2024andnbsp9:30 A M Medical Record Number: [id number] Patient Account Number: [id number] Date of Birth/Gender: Treating RN: Jul 06, 1955 (67 y.o. [name]) [name] Primary Care Physician: [name] Other Clinician: Referring Physician: Treating Physician/Extender: [name] in Treatment: 0 Education Assessment Education Provided To: Patient Education Topics Provided Wound/Skin Impairment: Handouts: Caring for Your Ulcer Methods: Explain/Verbal Responses: State content correctly Electronic Signature(s) Signed: 01/21/2023 3:51:45 PM By: [name] Pax RN Entered By: [name] on 01/21/2023 07:52:53 -------------------------------------------------------------------------------- Wound Assessment Details Patient Name: Date of Service: [name], [name] 01/21/2023 9:30 A M Medical Record Number: [id number] Patient Account Number: [id number] Date of Birth/Sex: Treating RN: 06-22-1955 (67 y.o. 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 Wound Open Wounding Event: Pressure Injury Status: Date Acquired: 01/04/2023 Comorbid Deep Vein Thrombosis, Peripheral Venous Disease, Neuropathy, Weeks Of Treatment: 0 History: Received Chemotherapy, Received Radiation Clustered Wound: No Photos Sharrow, Steven ([id number]) [id number] Page 8 of 9 Wound Measurements Length: (cm) 4 Width: (cm) 2.5 Depth: (cm) 0.1 Area: (cm) 7.854 Volume: (cm) 0.785 % Reduction in Area: % Reduction in Volume: Epithelialization: None Tunneling: No Undermining: No Wound Description Classification: Category/Stage III Exudate Amount: Medium Exudate Type: Serosanguineous Exudate Color: red, brown Foul Odor After Cleansing: No Slough/Fibrino Yes Wound Bed Granulation Amount: Medium (34-66%) Exposed Structure Granulation Quality: Pink Fascia Exposed: No Necrotic Amount: Medium (34-66%) Fat Layer (Subcutaneous Tissue) Exposed: Yes Tendon Exposed: No Muscle Exposed: No Joint Exposed: No Bone Exposed: No Treatment Notes Wound #4 (Gluteus) Wound Laterality: Left Cleanser Byram Ancillary Kit - 15 Day Supply Discharge Instruction: Use supplies as instructed; Kit contains: (15) Saline Bullets; (15) 3x3 Gauze; 15 pr Gloves Normal Saline Discharge Instruction: Wash your hands with soap and water. Remove old dressing, discard into plastic bag and place into trash. Cleanse the wound with Normal Saline prior to applying a clean dressing using gauze sponges, not tissues or cotton balls. Do not scrub or use excessive force. Pat dry using gauze sponges, not tissue or cotton balls. Soap and Water Discharge Instruction: Gently cleanse wound with antibacterial soap, rinse and pat dry prior to dressing wounds Peri-Wound Care Topical Primary Dressing Promogran Matrix 4.34 (in) Discharge Instruction: Moisten  w/normal saline or sterile water; Cover wound as directed. Do not remove from wound bed. Secondary Dressing (BORDER) Zetuvit Plus SILICONE BORDER Dressing 5x5 (in/in) Discharge Instruction: Please do not put silicone bordered dressings under wraps. Use non-bordered dressing only. Secured With Compression Wrap Compression Stockings Facilities manager) Signed: 01/21/2023 3:51:45 PM By: [name] Pax RN Entered By: [name] on 01/21/2023 07:47:35 Heuberger, Valerio ([id number]) [id number].pdf Page 9 of 9 -------------------------------------------------------------------------------- Vitals Details Patient Name: Date of Service: [name], [name] 01/21/2023 9:30 A M Medical Record Number: [id number] Patient Account Number: [id number] Date of Birth/Sex: Treating RN: 30-Jul-1955 (67 y.o. [name]) [name] Primary Care Mehgan Santmyer: [name] Other Clinician: Referring Kyree Adriano: Treating Alix Stowers/Extender: [name] in Treatment: 0 Vital Signs Time Taken: 10:10 Temperature (F): 99.2 Height (in): 59 Pulse (bpm): 90 Source: Stated Respiratory Rate (breaths/min): 18 Weight (lbs): 288 Blood Pressure (mmHg): 151/80 Source: Stated Reference Range: 80 - 120 mg / dl Body Mass Index (BMI): 58.2 Electronic Signature(s) Signed: 01/21/2023 3:51:45 PM By: [name] Pax RN Entered By: [name] on 01/21/2023 07:11:01

## 2023-01-22 ENCOUNTER — Other Ambulatory Visit: Payer: Self-pay

## 2023-01-22 NOTE — Progress Notes (Signed)
[name], [name] ([id number]) [id number] Page 1 of 9 Visit Report for 01/21/2023 Chief Complaint Document Details Patient Name: Date of Service: [name], [name] 01/21/2023 9:30 A M Medical Record Number: [id number] Patient Account Number: [id number] Date of Birth/Sex: Treating RN: 1955-10-28 (67 y.o. [name]) [name] Primary Care Provider: Rolin [name] Other Clinician: Referring Provider: Treating Provider/Extender: [name] Weeks in Treatment: 0 Information Obtained from: Patient Chief Complaint Pressure ulcer left buttock stage 3 Electronic Signature(s) Signed: 01/21/2023 10:44:44 AM By: [name] Derry PA-C Entered By: [name] on 01/21/2023 07:44:43 -------------------------------------------------------------------------------- Debridement Details Patient Name: Date of Service: [name], [name] 01/21/2023 9:30 A M Medical Record Number: [id number] Patient Account Number: [id number] Date of Birth/Sex: Treating RN: 1955-08-14 (67 y.o. [name]) [name] Primary Care Provider: Rolin [name] Other Clinician: Referring Provider: Treating Provider/Extender: [name] Weeks in Treatment: 0 Debridement Performed for Assessment: Wound #4 Left Gluteus Performed By: Physician [name] Derry, PA-C The following information was scribed by: [name] The information was scribed for: [name] Debridement Type: Debridement Level of Consciousness (Pre-procedure): Awake and Alert Pre-procedure Verification/Time Out Yes - 10:50 Taken: Start Time: 10:50 Pain Control: Lidocaine 4% T opical Solution Percent of Wound Bed Debrided: 100% T Area Debrided (cm): otal 7.85 Tissue and other material debrided: Viable, Non-Viable, Slough, Subcutaneous, Skin: Dermis , Skin: Epidermis, Slough Level: Skin/Subcutaneous Tissue Debridement Description: Excisional Instrument: Curette Bleeding: Minimum Hemostasis Achieved: Pressure [name], [name]  ([id number]) [id number] Page 2 of 9 End Time: 10:56 Procedural Pain: 0 Post Procedural Pain: 0 Response to Treatment: Procedure was tolerated well Level of Consciousness (Post- Awake and Alert procedure): Post Debridement Measurements of Total Wound Length: (cm) 4 Stage: Category/Stage III Width: (cm) 2.5 Depth: (cm) 0.1 Volume: (cm) 0.785 Character of Wound/Ulcer Post Debridement: Improved Post Procedure Diagnosis Same as Pre-procedure Electronic Signature(s) Signed: 01/21/2023 1:29:16 PM By: [name] Pax RN Signed: 01/22/2023 4:00:09 PM By: [name] Derry PA-C Previous Signature: 01/21/2023 11:10:56 AM Version By: [name] Pax RN Entered By: [name] on 01/21/2023 10:29:15 -------------------------------------------------------------------------------- HPI Details Patient Name: Date of Service: [name], [name] 01/21/2023 9:30 A M Medical Record Number: [id number] Patient Account Number: [id number] Date of Birth/Sex: Treating RN: 1955-07-03 (67 y.o. [name] Primary Care Provider: Rolin [name] Other Clinician: Referring Provider: Treating Provider/Extender: [name] in Treatment: 0 History of Present Illness HPI Description: Readmission: 01-21-2023 upon evaluation today patient appears to be doing poorly currently in regard to a wound over the left gluteal location. He tells me that this is somewhat painful. It has been present since around the beginning of November as best he knows and can remember. Fortunately there does not appear to be any signs of active infection at this time which is great news. With that being said there is some necrotic tissue he is going require some sharp debridement. He actually has an acquaintance who was able to bring him in today but transportation is a significant issue for him. He tells me that it is very difficult for him to Get to the clinic and due to the fact that he does not have  transportation and his insurance does not provide this. Subsequently he at this point tells me that he pretty much has not been out of his house until this past summer for about 2-1/2 years he has his food delivered and he really does not go anywhere. Patient has a history of malignant prostate cancer, hypertension, peripheral vascular disease, bilateral above-knee amputations, history of DVT and he  is on , long-term anticoagulant therapy. Electronic Signature(s) Signed: 01/21/2023 4:58:13 PM By: [name] Derry PA-C Entered By: [name] on 01/21/2023 13:58:13 [name], [name] ([id number]) [id number] Page 3 of 9 -------------------------------------------------------------------------------- Physical Exam Details Patient Name: Date of Service: [name], [name] 01/21/2023 9:30 A M Medical Record Number: [id number] Patient Account Number: [id number] Date of Birth/Sex: Treating RN: 1955/10/07 (67 y.o. [name]) [name] Primary Care Provider: Rolin [name] Other Clinician: Referring Provider: Treating Provider/Extender: [name] Weeks in Treatment: 0 Constitutional patient is hypertensive.. pulse regular and within target range for patient.Marland Kitchen respirations regular, non-labored and within target range for patient.Marland Kitchen temperature within target range for patient.. Obese and well-hydrated in no acute distress. Eyes conjunctiva clear no eyelid edema noted. pupils equal round and reactive to light and accommodation. Ears, Nose, Mouth, and Throat no gross abnormality of ear auricles or external auditory canals. normal hearing noted during conversation. mucus membranes moist. Respiratory normal breathing without difficulty. Musculoskeletal Patient unable to walk Due to having bilateral above-knee amputations.Marland Kitchen Psychiatric this patient is able to make decisions and demonstrates good insight into disease process. Alert and Oriented x 3. pleasant and  cooperative. Notes Upon inspection patient's wound actually did require sharp debridement clearway necrotic debris he tolerated the debridement today without complication and postdebridement the wound bed actually appears to be doing better which is good news. With that being said there was some bleeding he did have some discomfort with debridement but postdebridement this appears to be significantly improved. Electronic Signature(s) Signed: 01/21/2023 4:58:48 PM By: [name] Derry PA-C Entered By: [name] on 01/21/2023 13:58:48 -------------------------------------------------------------------------------- Physician Orders Details Patient Name: Date of Service: [name], Caelen 01/21/2023 9:30 A M Medical Record Number: [id number] Patient Account Number: [id number] Date of Birth/Sex: Treating RN: 1955-10-12 (67 y.o. [name]) [name] Primary Care Provider: Rolin [name] Other Clinician: Referring Provider: Treating Provider/Extender: [name] in Treatment: 0 The following information was scribed by: [name] The information was scribed for: [name] Verbal / Phone Orders: No PAYAMPS, Isaish ([id number]) [id number] Page 4 of 9 Diagnosis Coding ICD-10 Coding Code Description [id number] Pressure ulcer of left buttock, stage 3 C61 Malignant neoplasm of prostate I10 Essential (primary) hypertension I73.89 Other specified peripheral vascular diseases Z89.611 Acquired absence of right leg above knee Z89.612 Acquired absence of left leg above knee Z86.718 Personal history of other venous thrombosis and embolism Z79.01 Long term (current) use of anticoagulants Follow-up Appointments Return Appointment in 1 week. Home Health ADMIT to Home Health for wound care. May utilize formulary equivalent dressing for wound treatment orders unless otherwise specified. Home Health Nurse may visit PRN to address patients wound care needs. Aldine Contes  [phone] Bathing/ Shower/ Hygiene No tub bath. Anesthetic (Use 'Patient Medications' Section for Anesthetic Order Entry) Lidocaine applied to wound bed Wound Treatment Wound #4 - Gluteus Wound Laterality: Left Cleanser: Byram Ancillary Kit - 15 Day Supply (DME) (Generic) 3 x Per Week/30 Days Discharge Instructions: Use supplies as instructed; Kit contains: (15) Saline Bullets; (15) 3x3 Gauze; 15 pr Gloves Cleanser: Normal Saline 3 x Per Week/30 Days Discharge Instructions: Wash your hands with soap and water. Remove old dressing, discard into plastic bag and place into trash. Cleanse the wound with Normal Saline prior to applying a clean dressing using gauze sponges, not tissues or cotton balls. Do not scrub or use excessive force. Pat dry using gauze sponges, not tissue or cotton balls. Cleanser: Soap and Water 3 x Per Week/30 Days Discharge Instructions: Gently cleanse wound  with antibacterial soap, rinse and pat dry prior to dressing wounds Prim Dressing: Promogran Matrix 4.34 (in) (DME) (Generic) 3 x Per Week/30 Days ary Discharge Instructions: Moisten w/normal saline or sterile water; Cover wound as directed. Do not remove from wound bed. Secondary Dressing: (BORDER) Zetuvit Plus SILICONE BORDER Dressing 5x5 (in/in) (DME) (Generic) 3 x Per Week/30 Days Discharge Instructions: Please do not put silicone bordered dressings under wraps. Use non-bordered dressing only. Electronic Signature(s) Signed: 01/21/2023 3:51:45 PM By: [name] Pax RN Signed: 01/22/2023 4:00:09 PM By: [name] Derry PA-C Entered By: [name] on 01/21/2023 09:40:35 -------------------------------------------------------------------------------- Problem List Details Patient Name: Date of Service: [name], Sander 01/21/2023 9:30 A M Medical Record Number: [id number] Patient Account Number: [id number] Date of Birth/Sex: Treating RN: [birth year] (67 y.o. [name] Primary Care Provider: Rolin [name] Other  Clinician: Referring Provider: Treating Provider/Extender: [name] in Treatment: 0 Pridgeon, Bliss ([id number]) [id number] Page 5 of 9 Active Problems ICD-10 Encounter Code Description Active Date MDM Diagnosis L89.323 Pressure ulcer of left buttock, stage 3 01/21/2023 No Yes C61 Malignant neoplasm of prostate 01/21/2023 No Yes I10 Essential (primary) hypertension 01/21/2023 No Yes I73.89 Other specified peripheral vascular diseases 01/21/2023 No Yes Z89.611 Acquired absence of right leg above knee 01/21/2023 No Yes Z89.612 Acquired absence of left leg above knee 01/21/2023 No Yes Z86.718 Personal history of other venous thrombosis and embolism 01/21/2023 No Yes Z79.01 Long term (current) use of anticoagulants 01/21/2023 No Yes Inactive Problems Resolved Problems Electronic Signature(s) Signed: 01/21/2023 10:44:13 AM By: [name] Derry PA-C Entered By: [name] on 01/21/2023 07:44:13 -------------------------------------------------------------------------------- Progress Note Details Patient Name: Date of Service: [name], Mickie 01/21/2023 9:30 A M Medical Record Number: [id number] Patient Account Number: [id number] Date of Birth/Sex: Treating RN: 05/21/1955 (67 y.o. [name] Primary Care Provider: Rolin [name] Other Clinician: Referring Provider: Treating Provider/Extender: [name] in Treatment: 0 Subjective Chief Complaint Information obtained from Patient Pressure ulcer left buttock stage 3 Peretti, Oney ([id number]) [id number] Page 6 of 9 History of Present Illness (HPI) Readmission: 01-21-2023 upon evaluation today patient appears to be doing poorly currently in regard to a wound over the left gluteal location. He tells me that this is somewhat painful. It has been present since around the beginning of November as best he knows and can remember. Fortunately  there does not appear to be any signs of active infection at this time which is great news. With that being said there is some necrotic tissue he is going require some sharp debridement. He actually has an acquaintance who was able to bring him in today but transportation is a significant issue for him. He tells me that it is very difficult for him to Get to the clinic and due to the fact that he does not have transportation and his insurance does not provide this. Subsequently he at this point tells me that he pretty much has not been out of his house until this past summer for about 2-1/2 years he has his food delivered and he really does not go anywhere. Patient has a history of malignant prostate cancer, hypertension, peripheral vascular disease, bilateral above-knee amputations, history of DVT and he is on , long-term anticoagulant therapy. Patient History Information obtained from Patient. Allergies bacitracin, vancomycin, spironolactone, ketorolac Family History Cancer - Father, Heart Disease - Father, No family history of Diabetes, Hereditary Spherocytosis, Hypertension, Kidney Disease, Lung Disease, Seizures, Stroke, Thyroid Problems, Tuberculosis. Social History Never smoker, Marital Status - Married, Alcohol  Use - Rarely, Drug Use - No History, Caffeine Use - Never. Medical History Cardiovascular Patient has history of Deep Vein Thrombosis, Peripheral Venous Disease Neurologic Patient has history of Neuropathy Oncologic Patient has history of Received Chemotherapy, Received Radiation Review of Systems (ROS) Eyes Complains or has symptoms of Glasses / Contacts. Integumentary (Skin) Complains or has symptoms of Wounds. Objective Constitutional patient is hypertensive.. pulse regular and within target range for patient.Marland Kitchen respirations regular, non-labored and within target range for patient.Marland Kitchen temperature within target range for patient.. Obese and well-hydrated in no acute  distress. Vitals Time Taken: 10:10 AM, Height: 59 in, Source: Stated, Weight: 288 lbs, Source: Stated, BMI: 58.2, Temperature: 99.2 F, Pulse: 90 bpm, Respiratory Rate: 18 breaths/min, Blood Pressure: 151/80 mmHg. Eyes conjunctiva clear no eyelid edema noted. pupils equal round and reactive to light and accommodation. Ears, Nose, Mouth, and Throat no gross abnormality of ear auricles or external auditory canals. normal hearing noted during conversation. mucus membranes moist. Respiratory normal breathing without difficulty. Musculoskeletal Patient unable to walk Due to having bilateral above-knee amputations.Marland Kitchen Psychiatric this patient is able to make decisions and demonstrates good insight into disease process. Alert and Oriented x 3. pleasant and cooperative. General Notes: Upon inspection patient's wound actually did require sharp debridement clearway necrotic debris he tolerated the debridement today without complication and postdebridement the wound bed actually appears to be doing better which is good news. With that being said there was some bleeding he did have some discomfort with debridement but postdebridement this appears to be significantly improved. Integumentary (Hair, Skin) Wound #4 status is Open. Original cause of wound was Pressure Injury. The date acquired was: 01/04/2023. The wound is located on the Left Gluteus. The wound measures 4cm length x 2.5cm width x 0.1cm depth; 7.854cm^2 area and 0.785cm^3 volume. There is Fat Layer (Subcutaneous Tissue) exposed. There is no tunneling or undermining noted. There is a medium amount of serosanguineous drainage noted. There is medium (34-66%) pink granulation within the wound bed. There is a medium (34-66%) amount of necrotic tissue within the wound bed. 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 There was a Excisional Skin/Subcutaneous Tissue Debridement with a total area of 7.85 sq cm performed by [name] Derry, PA-C. With the following instrument(s): Curette to remove Viable and Non-Viable tissue/material. Material removed includes Subcutaneous Tissue, Slough, Skin: Dermis, and Skin: Epidermis after achieving pain control using Lidocaine 4% T opical Solution. No specimens were taken. A time out was conducted at 10:50, prior to the start of the procedure. A Minimum amount of bleeding was controlled with Pressure. The procedure was tolerated well with a pain level of 0 throughout and a pain level of 0 following the procedure. Post Debridement Measurements: 4cm length x 2.5cm width x 0.1cm depth; 0.785cm^3 volume. Post debridement Stage noted as Category/Stage III. Character of Wound/Ulcer Post Debridement is improved. Post procedure Diagnosis Wound #4: Same as Pre-Procedure Plan Follow-up Appointments: Return Appointment in 1 week. Home Health: ADMIT to Home Health for wound care. May utilize formulary equivalent dressing for wound treatment orders unless otherwise specified. Home Health Nurse  may visit PRN to address patients wound care needs. Aldine Contes [phone] Bathing/ Shower/ Hygiene: No tub bath. Anesthetic (Use 'Patient Medications' Section for Anesthetic Order Entry): Lidocaine applied to wound bed WOUND #4: - Gluteus Wound Laterality: Left Cleanser: Byram Ancillary Kit - 15 Day Supply (DME) (Generic) 3 x Per Week/30 Days Discharge Instructions: Use supplies as instructed; Kit contains: (15) Saline  Bullets; (15) 3x3 Gauze; 15 pr Gloves Cleanser: Normal Saline 3 x Per Week/30 Days Discharge Instructions: Wash your hands with soap and water. Remove old dressing, discard into plastic bag and place into trash. Cleanse the wound with Normal Saline prior to applying a clean dressing using gauze sponges, not tissues or cotton balls. Do not scrub or use excessive force. Pat dry using gauze sponges, not tissue or cotton balls. Cleanser: Soap and Water 3 x Per Week/30 Days Discharge Instructions: Gently cleanse wound with antibacterial soap, rinse and pat dry prior to dressing wounds Prim Dressing: Promogran Matrix 4.34 (in) (DME) (Generic) 3 x Per Week/30 Days ary Discharge Instructions: Moisten w/normal saline or sterile water; Cover wound as directed. Do not remove from wound bed. Secondary Dressing: (BORDER) Zetuvit Plus SILICONE BORDER Dressing 5x5 (in/in) (DME) (Generic) 3 x Per Week/30 Days Discharge Instructions: Please do not put silicone bordered dressings under wraps. Use non-bordered dressing only. 1. Based on what I am seeing I do believe that the patient at this point would benefit from a Promogran dressing followed by the bordered foam for protection following. 2. I am good recommend as well that the patient should continue to monitor for any signs of infection or worsening. Right now the dressing needs to be changed 3 times per week will get a try to see about getting home health to help out with this. He is in agreement with that plan. 3. I am also going to recommend that appropriate offloading should be undertaken I discussed with him with his motorized wheelchairs to that he needs to at least once every hour for about 10 minutes put it back in the 45 degree angle in order to keep pressure off of the area he voiced understanding. I think this is going to be of utmost importance as far as healing is concerned and he voiced understanding. We will see patient back for reevaluation in 1  week here in the clinic. If anything worsens or changes patient will contact our office for additional recommendations. Electronic Signature(s) Signed: 01/21/2023 5:01:03 PM By: [name] Derry PA-C Entered By: [name] on 01/21/2023 14:01:03 Loos, Windsor ([id number]) [id number] Page 8 of 9 -------------------------------------------------------------------------------- ROS/PFSH Details Patient Name: Date of Service: [name], Philbert 01/21/2023 9:30 A M Medical Record Number: [id number] Patient Account Number: [id number] Date of Birth/Sex: Treating RN: [birth year] (67 y.o. [name]) [name] Primary Care Provider: Rolin [name] Other Clinician: Referring Provider: Treating Provider/Extender: [name] in Treatment: 0 Information Obtained From Patient Eyes Complaints and Symptoms: Positive for: Glasses / Contacts Integumentary (Skin) Complaints and Symptoms: Positive for: Wounds Cardiovascular Medical History: Positive for: Deep Vein Thrombosis; Peripheral Venous Disease Neurologic Medical History: Positive for: Neuropathy Oncologic Medical History: Positive for: Received Chemotherapy; Received Radiation Immunizations Pneumococcal Vaccine: Received Pneumococcal Vaccination: No Implantable Devices None Family and Social History Cancer: Yes - Father; Diabetes: No; Heart Disease: Yes - Father; Hereditary Spherocytosis: No; Hypertension: No; Kidney Disease: No; Lung Disease: No; Seizures: No; Stroke: No; Thyroid Problems: No; Tuberculosis: No; Never smoker; Marital Status - Married; Alcohol Use: Rarely; Drug Use: No History; Caffeine Use: Never;  Financial Concerns: No; Food, Clothing or Shelter Needs: No; Support System Lacking: No; Transportation Concerns: No Electronic Signature(s) Signed: 01/21/2023 3:51:45 PM By: [name] Pax RN Signed: 01/22/2023 4:00:09 PM By: [name] Derry PA-C Entered By: [name] on 01/21/2023  07:13:36 Sturgill, Kee ([id number]) [id number] Page 9 of 9 -------------------------------------------------------------------------------- SuperBill Details Patient Name: Date of Service: [name], Jahn 01/21/2023 Medical Record Number: [id number] Patient Account Number: [id number] Date of Birth/Sex: Treating RN: 1955-12-30 (67 y.o. [name]) [name] Primary Care Provider: Rolin [name] Other Clinician: Referring Provider: Treating Provider/Extender: [name] Weeks in Treatment: 0 Diagnosis Coding ICD-10 Codes Code Description [id number] Pressure ulcer of left buttock, stage 3 C61 Malignant neoplasm of prostate I10 Essential (primary) hypertension I73.89 Other specified peripheral vascular diseases Z89.611 Acquired absence of right leg above knee Z89.612 Acquired absence of left leg above knee Z86.718 Personal history of other venous thrombosis and embolism Z79.01 Long term (current) use of anticoagulants Facility Procedures : CPT4 Code: [id number] Description: [id number] - WOUND CARE VISIT-LEV 2 EST PT Modifier: Quantity: 1 : CPT4 Code: [id number] Description: 11042 - DEB SUBQ TISSUE 20 SQ CM/< ICD-10 Diagnosis Description L89.323 Pressure ulcer of left buttock, stage 3 Modifier: Quantity: 1 Physician Procedures : CPT4 Code Description Modifier [id number] WC PHYS LEVEL 3 NEW PT 25 ICD-10 Diagnosis Description L89.323 Pressure ulcer of left buttock, stage 3 C61 Malignant neoplasm of prostate I10 Essential (primary) hypertension I73.89 Other specified peripheral  vascular diseases Quantity: 1 : 11042 11042 - WC PHYS SUBQ TISS 20 SQ CM ICD-10 Diagnosis Description L89.323 Pressure ulcer of left buttock, stage 3 Quantity: 1 Electronic Signature(s) Signed: 01/21/2023 5:01:27 PM By: [name] Derry PA-C Previous Signature: 01/21/2023 11:17:43 AM Version By: [name] Pax RN Entered By: [name] on 01/21/2023 14:01:27

## 2023-01-23 ENCOUNTER — Ambulatory Visit: Payer: Medicare PPO | Admitting: Speech Pathology

## 2023-01-23 NOTE — Telephone Encounter (Signed)
Patient returned my call and would like to meet in clinic on 02/04/23 to sign Attestation Form for re-enrollment. I will submit once signatures are in hand. I will continue to follow and update until final determination.    Ardeen Fillers, CPhT Oncology Pharmacy Patient Advocate  [facility]  [phone] (phone) [phone] (fax) 01/23/2023 1:17 PM

## 2023-01-24 ENCOUNTER — Other Ambulatory Visit: Payer: Self-pay | Admitting: *Deleted

## 2023-01-28 ENCOUNTER — Telehealth: Payer: Self-pay | Admitting: *Deleted

## 2023-01-28 ENCOUNTER — Encounter: Payer: Self-pay | Admitting: Internal Medicine

## 2023-01-28 NOTE — Telephone Encounter (Signed)
Contacted [facility] and confirmed that referral was received. Patient has not called  Eye back to r/s. I asked the eye center to attempt to call patient again. I sent a message to pt on mychart to alert him to call the eye ctr.

## 2023-01-28 NOTE — Telephone Encounter (Signed)
Patient has not yet seen mychart msg, so I reached out to personally speak to pt via telephone. He was able to get in touch with [facility]. He has an eye apt on 12/6.  Josh also asked that I discuss a neuro-oncology referral with Dr. 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 Pt stated that he would like to see Dr. Barbaraann Cao to see if he has any additional suggestions to manage this neuropathic pain. Apt not available until 12/20 with Dr. Barbaraann Cao. Pt accept this apt

## 2023-01-29 ENCOUNTER — Other Ambulatory Visit: Payer: Self-pay

## 2023-01-30 ENCOUNTER — Telehealth: Payer: Self-pay

## 2023-01-30 NOTE — Telephone Encounter (Signed)
Clinical Social Worker returned patient's call.  Left a vm with contact information.

## 2023-02-04 ENCOUNTER — Ambulatory Visit: Payer: Medicare PPO

## 2023-02-04 ENCOUNTER — Inpatient Hospital Stay: Payer: Medicare PPO | Attending: Internal Medicine

## 2023-02-04 ENCOUNTER — Ambulatory Visit: Payer: Medicare PPO | Admitting: Internal Medicine

## 2023-02-04 ENCOUNTER — Inpatient Hospital Stay: Payer: Medicare PPO

## 2023-02-04 ENCOUNTER — Encounter: Payer: Self-pay | Admitting: Internal Medicine

## 2023-02-04 ENCOUNTER — Other Ambulatory Visit: Payer: Medicare PPO

## 2023-02-04 ENCOUNTER — Inpatient Hospital Stay (HOSPITAL_BASED_OUTPATIENT_CLINIC_OR_DEPARTMENT_OTHER): Payer: Medicare PPO | Admitting: Internal Medicine

## 2023-02-04 VITALS — BP 156/96 | HR 77

## 2023-02-04 VITALS — BP 136/79 | HR 78 | Temp 98.5°F

## 2023-02-04 DIAGNOSIS — L89891 Pressure ulcer of other site, stage 1: Secondary | ICD-10-CM | POA: Insufficient documentation

## 2023-02-04 DIAGNOSIS — Z801 Family history of malignant neoplasm of trachea, bronchus and lung: Secondary | ICD-10-CM | POA: Insufficient documentation

## 2023-02-04 DIAGNOSIS — Z5111 Encounter for antineoplastic chemotherapy: Secondary | ICD-10-CM | POA: Insufficient documentation

## 2023-02-04 DIAGNOSIS — C7951 Secondary malignant neoplasm of bone: Secondary | ICD-10-CM

## 2023-02-04 DIAGNOSIS — G546 Phantom limb syndrome with pain: Secondary | ICD-10-CM | POA: Insufficient documentation

## 2023-02-04 DIAGNOSIS — I2510 Atherosclerotic heart disease of native coronary artery without angina pectoris: Secondary | ICD-10-CM | POA: Insufficient documentation

## 2023-02-04 DIAGNOSIS — Z79899 Other long term (current) drug therapy: Secondary | ICD-10-CM | POA: Diagnosis not present

## 2023-02-04 DIAGNOSIS — Z7901 Long term (current) use of anticoagulants: Secondary | ICD-10-CM | POA: Diagnosis not present

## 2023-02-04 DIAGNOSIS — C61 Malignant neoplasm of prostate: Secondary | ICD-10-CM

## 2023-02-04 DIAGNOSIS — Z89612 Acquired absence of left leg above knee: Secondary | ICD-10-CM | POA: Insufficient documentation

## 2023-02-04 DIAGNOSIS — Z89611 Acquired absence of right leg above knee: Secondary | ICD-10-CM | POA: Diagnosis not present

## 2023-02-04 DIAGNOSIS — G893 Neoplasm related pain (acute) (chronic): Secondary | ICD-10-CM | POA: Insufficient documentation

## 2023-02-04 DIAGNOSIS — R5383 Other fatigue: Secondary | ICD-10-CM | POA: Diagnosis not present

## 2023-02-04 DIAGNOSIS — Z86711 Personal history of pulmonary embolism: Secondary | ICD-10-CM | POA: Insufficient documentation

## 2023-02-04 DIAGNOSIS — Z5189 Encounter for other specified aftercare: Secondary | ICD-10-CM | POA: Diagnosis not present

## 2023-02-04 DIAGNOSIS — M542 Cervicalgia: Secondary | ICD-10-CM | POA: Insufficient documentation

## 2023-02-04 DIAGNOSIS — Z79891 Long term (current) use of opiate analgesic: Secondary | ICD-10-CM | POA: Insufficient documentation

## 2023-02-04 DIAGNOSIS — F32A Depression, unspecified: Secondary | ICD-10-CM | POA: Insufficient documentation

## 2023-02-04 DIAGNOSIS — Z923 Personal history of irradiation: Secondary | ICD-10-CM | POA: Insufficient documentation

## 2023-02-04 DIAGNOSIS — F419 Anxiety disorder, unspecified: Secondary | ICD-10-CM | POA: Insufficient documentation

## 2023-02-04 LAB — CBC WITH DIFFERENTIAL (CANCER CENTER ONLY)
Abs Immature Granulocytes: 0.06 10*3/uL (ref 0.00–0.07)
Basophils Absolute: 0.1 10*3/uL (ref 0.0–0.1)
Basophils Relative: 1 %
Eosinophils Absolute: 0 10*3/uL (ref 0.0–0.5)
Eosinophils Relative: 0 %
HCT: 29.2 % — ABNORMAL LOW (ref 39.0–52.0)
Hemoglobin: 9.8 g/dL — ABNORMAL LOW (ref 13.0–17.0)
Immature Granulocytes: 1 %
Lymphocytes Relative: 8 %
Lymphs Abs: 0.7 10*3/uL (ref 0.7–4.0)
MCH: 31.9 pg (ref 26.0–34.0)
MCHC: 33.6 g/dL (ref 30.0–36.0)
MCV: 95.1 fL (ref 80.0–100.0)
Monocytes Absolute: 0.9 10*3/uL (ref 0.1–1.0)
Monocytes Relative: 11 %
Neutro Abs: 6.5 10*3/uL (ref 1.7–7.7)
Neutrophils Relative %: 79 %
Platelet Count: 219 10*3/uL (ref 150–400)
RBC: 3.07 MIL/uL — ABNORMAL LOW (ref 4.22–5.81)
RDW: 18.4 % — ABNORMAL HIGH (ref 11.5–15.5)
WBC Count: 8.1 10*3/uL (ref 4.0–10.5)
nRBC: 0 % (ref 0.0–0.2)

## 2023-02-04 LAB — CMP (CANCER CENTER ONLY)
ALT: 17 U/L (ref 0–44)
AST: 26 U/L (ref 15–41)
Albumin: 3.3 g/dL — ABNORMAL LOW (ref 3.5–5.0)
Alkaline Phosphatase: 113 U/L (ref 38–126)
Anion gap: 9 (ref 5–15)
BUN: 15 mg/dL (ref 8–23)
CO2: 23 mmol/L (ref 22–32)
Calcium: 8.8 mg/dL — ABNORMAL LOW (ref 8.9–10.3)
Chloride: 107 mmol/L (ref 98–111)
Creatinine: 0.78 mg/dL (ref 0.61–1.24)
GFR, Estimated: >60 mL/min (ref >60)
Glucose, Bld: 104 mg/dL — ABNORMAL HIGH (ref 70–99)
Potassium: 3.3 mmol/L — ABNORMAL LOW (ref 3.5–5.1)
Sodium: 139 mmol/L (ref 135–145)
Total Bilirubin: 1.2 mg/dL — ABNORMAL HIGH (ref <1.2)
Total Protein: 6.1 g/dL — ABNORMAL LOW (ref 6.5–8.1)

## 2023-02-04 LAB — PSA: Prostatic Specific Antigen: 22.18 ng/mL — ABNORMAL HIGH (ref 0.00–4.00)

## 2023-02-04 MED ORDER — SODIUM CHLORIDE 0.9 % IV SOLN
Freq: Once | INTRAVENOUS | Status: AC
  Filled 2023-02-04: qty 250

## 2023-02-04 MED ORDER — HEPARIN SOD (PORK) LOCK FLUSH 100 UNIT/ML IV SOLN
500.0000 [IU] | Freq: Once | INTRAVENOUS | Status: AC | PRN
  Administered 2023-02-04: 500 [IU]
  Filled 2023-02-04: qty 5

## 2023-02-04 MED ORDER — SODIUM CHLORIDE 0.9 % IV SOLN
60.0000 mg/m2 | Freq: Once | INTRAVENOUS | Status: AC
  Administered 2023-02-04: 139 mg via INTRAVENOUS
  Filled 2023-02-04: qty 13.9

## 2023-02-04 MED ORDER — DEXAMETHASONE SODIUM PHOSPHATE 10 MG/ML IJ SOLN
10.0000 mg | Freq: Once | INTRAMUSCULAR | Status: AC
  Administered 2023-02-04: 10 mg via INTRAVENOUS
  Filled 2023-02-04: qty 1

## 2023-02-04 MED ORDER — SODIUM CHLORIDE 0.9% FLUSH
10.0000 mL | INTRAVENOUS | Status: DC | PRN
  Administered 2023-02-04: 10 mL
  Filled 2023-02-04: qty 10

## 2023-02-04 NOTE — Patient Instructions (Signed)
[facility] - A DEPT OF [facility]  Discharge Instructions: Thank you for choosing [facility] to provide your oncology and hematology care.  If you have a lab appointment with the Cancer Center, please go directly to the Cancer Center and check in at the registration area.  Wear comfortable clothing and clothing appropriate for easy access to any Portacath or PICC line.   We strive to give you quality time with your provider. You may need to reschedule your appointment if you arrive late (15 or more minutes).  Arriving late affects you and other patients whose appointments are after yours.  Also, if you miss three or more appointments without notifying the office, you may be dismissed from the clinic at the provider's discretion.      For prescription refill requests, have your pharmacy contact our office and allow 72 hours for refills to be completed.    Today you received the following chemotherapy and/or immunotherapy agents- taxotere      To help prevent nausea and vomiting after your treatment, we encourage you to take your nausea medication as directed.  BELOW ARE SYMPTOMS THAT SHOULD BE REPORTED IMMEDIATELY: *FEVER GREATER THAN 100.4 F (38 C) OR HIGHER *CHILLS OR SWEATING *NAUSEA AND VOMITING THAT IS NOT CONTROLLED WITH YOUR NAUSEA MEDICATION *UNUSUAL SHORTNESS OF BREATH *UNUSUAL BRUISING OR BLEEDING *URINARY PROBLEMS (pain or burning when urinating, or frequent urination) *BOWEL PROBLEMS (unusual diarrhea, constipation, pain near the anus) TENDERNESS IN MOUTH AND THROAT WITH OR WITHOUT PRESENCE OF ULCERS (sore throat, sores in mouth, or a toothache) UNUSUAL RASH, SWELLING OR PAIN  UNUSUAL VAGINAL DISCHARGE OR ITCHING   Items with * indicate a potential emergency and should be followed up as soon as possible or go to the Emergency Department if any problems should occur.  Please show the CHEMOTHERAPY ALERT CARD or IMMUNOTHERAPY  ALERT CARD at check-in to the Emergency Department and triage nurse.  Should you have questions after your visit or need to cancel or reschedule your appointment, please contact [facility] - A DEPT OF [facility]  [phone] and follow the prompts.  Office hours are 8:00 a.m. to 4:30 p.m. Monday - Friday. Please note that voicemails left after 4:00 p.m. may not be returned until the following business day.  We are closed weekends and major holidays. You have access to a nurse at all times for urgent questions. Please call the main number to the clinic [phone] and follow the prompts.  For any non-urgent questions, you may also contact your provider using MyChart. We now offer e-Visits for anyone 85 and older to request care online for non-urgent symptoms. For details visit [email or web address].   Also download the MyChart app! Go to the app store, search "MyChart", open the app, select [place], and log in with your MyChart username and password.

## 2023-02-04 NOTE — Assessment & Plan Note (Addendum)
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  On the thecal sac. Pre treatment : PSA 700. AUG 9th, 2024- PSMA PET - Multifocal intense radiotracer activity within the prostate gland consistent with primary prostate adenocarcinoma; Evidence of nodal metastasis with radiotracer avid tiny lymph nodes in the rectal sheath and periaortic retroperitoneum; Extensive radiotracer avid skeletal metastasis with near confluent lesions within the pelvis. Lesions in the lumbar spine, RIGHT scapula, LEFT humerus. Lesions too numerous to count. No evidence of visceral metastasis or pulmonary metastasis; AUG 2024-guardant testing- ATM [eligible for PARPi]. On ADT- [started aug 1st, 2024]. PLAN: ADT + Taxotere+ Darolutamide   # Proceed with cycle #4  of Taxotere [at 60 mg/m2 dose] . Labs-CBC/chemistries were reviewed with the patient slightly elevated Bili sec to [employer] syndrome. With Growth factor-on clairitin bone  pain.   # Neck pain-/Back pain pain medication: Patient currently on oxycodone 1 BID; gabapentin- refilled prescription.  S/p RT-  Dr. Pablo Ledger RT10/03/2022. MRI Neck-metastatic disease in vertebral bodies; no epidural disease. Recommend- evaluation for injection- stable; await Josh appt next week.  Discussed re: moderation of weights/ and avoid pressure on vertebral column.   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  # IV access: s/p mediport- stable.   # Decub ulcer-  s/p wound care evaluation/ home health evaluation- stable.   Firmagon transitioned to eligard q6 M [02/03/23]  # DISPOSITION: # Taxotere today; # tomorrow- injection; Eligard-  # follow up in 3 weeks- MD; labs- cbc/cmp;PSA; Taxotere-; D2- injection;  Dr.B

## 2023-02-04 NOTE — Progress Notes (Signed)
Back pain 5/10.  Has trouble sleeping, takes melatonin.  No appetite, no supplement drinks.  At what point does he need to be concerned about working out and lifting weights?  Where is his time limit of life at this point? PCP asked him this question.

## 2023-02-04 NOTE — Progress Notes (Signed)
Cancer Center OFFICE PROGRESS NOTE  Patient Care Team: Zada Finders Joycie Peek, MD as PCP - General (Family Medicine) Earna Coder, MD as Consulting Physician (Oncology) Carmina Miller, MD as Consulting Physician (Radiation Oncology)   Cancer Staging  Prostate cancer metastatic to bone [name]) Staging form: Prostate, AJCC 8th Edition - Clinical: Stage IVB (cTX, cNX, pM1b, PSA: 703) - Signed by Earna Coder, MD on 10/04/2022 Prostate specific antigen (PSA) range: 20 or greater    Oncology History Overview Note  #HIGH RISK/ DE-NOVO CASTRATE SENSITIVE PROSTATE CANCER: CT CAP [JULY 2024]-multiple bone metastases; MRI lumbar spine without contrast  [PCP]-L4-L5 lesion.  On the thecal sac.  PSA 700.   Check guardant testing. NO Biopsy. AUG 9th, 2024- PSMA PET - Multifocal intense radiotracer activity within the prostate gland consistent with primary prostate adenocarcinoma; Evidence of nodal metastasis with radiotracer avid tiny lymph nodes in the rectal sheath and periaortic retroperitoneum; Extensive radiotracer avid skeletal metastasis with near confluent lesions within the pelvis. Lesions in the lumbar spine, RIGHT scapula, LEFT humerus. Lesions too numerous to count. No evidence of visceral metastasis or pulmonary metastasis; awaiting guardant testing. On ADT- [started aug 1st, 2024].   # AUG 1st, 2024- Firmagon  --------------------- # CAD; on coumadin; Bil AKA    Prostate cancer metastatic to bone (HCC)  09/30/2022 Initial Diagnosis   Prostate cancer metastatic to bone (HCC)   10/04/2022 Cancer Staging   Staging form: Prostate, AJCC 8th Edition - Clinical: Stage IVB (cTX, cNX, pM1b, PSA: 703) - Signed by Earna Coder, MD on 10/04/2022 Prostate specific antigen (PSA) range: 20 or greater   11/29/2022 -  Chemotherapy   Patient is on Treatment Plan : PROSTATE Docetaxel (75) q21d      Genetic Testing   Negative genetic testing. No pathogenic variants  identified on the [facility] Common Hereditary Cancers+RNA panel. The report date is 01/09/2023.  The Common Hereditary Cancers Panel + RNA offered by [facility] includes sequencing and/or deletion duplication testing of the following 48 genes: APC*, ATM*, AXIN2, BAP1, BARD1, BMPR1A, BRCA1, BRCA2, BRIP1, CDH1, CDK4, CDKN2A (p14ARF), CDKN2A (p16INK4a), CHEK2, CTNNA1, DICER1*, EPCAM*, FH*, GREM1*, HOXB13, KIT, MBD4, MEN1*, MLH1*, MSH2*, MSH3*, MSH6*, MUTYH, NF1*, NTHL1, PALB2, PDGFRA, PMS2*, POLD1*, POLE, PTEN*, RAD51C, RAD51D, SDHA*, SDHB, SDHC*, SDHD, SMAD4, SMARCA4, STK11, TP53, TSC1*, TSC2, VHL.       HISTORY OF PRESENT ILLNESS: Patient ambulating-with wheel chair/AKA.  Patient is accompanied by friend.  [name] 67 y.o.  male pleasant patient with a medical history significant of s/p of bilateral AKA, PE on Coumadin, HTN, depression with  castrate sensitive metastatic prostate cancer currently on ADT+Darolutamide+Taxoetere chemotherapy is here for a follow up.  Patient complains of back pain 5/10.Has trouble sleeping, takes melatonin. NO diarrhea.    No appetite, no supplement drinks.   Denies any bladder or bowel incontinence.    Review of Systems  Constitutional:  Positive for malaise/fatigue. Negative for chills, diaphoresis, fever and weight loss.  HENT:  Negative for nosebleeds and sore throat.   Eyes:  Negative for double vision.  Respiratory:  Negative for cough, hemoptysis, sputum production, shortness of breath and wheezing.   Cardiovascular:  Positive for leg swelling. Negative for chest pain, palpitations and orthopnea.  Gastrointestinal:  Negative for abdominal pain, blood in stool, constipation, diarrhea, heartburn, melena, nausea and vomiting.  Genitourinary:  Negative for dysuria, frequency and urgency.  Musculoskeletal:  Positive for back pain, joint pain and myalgias.  Skin: Negative.  Negative for itching and rash.  Neurological:  Negative for dizziness, tingling, focal  weakness, weakness and headaches.  Endo/Heme/Allergies:  Does not bruise/bleed easily.  Psychiatric/Behavioral:  Negative for depression. The patient is not nervous/anxious and does not have insomnia.       PAST MEDICAL HISTORY :  Past Medical History:  Diagnosis Date   Depression    History of blood clots 08/13/2011   History of left knee replacement    HTN (hypertension)    Lymphedema    lt leg-per pt   PE (pulmonary thromboembolism) (HCC)    S/P AKA (above knee amputation) bilateral (HCC)    rt 11/24/07 and lt 01/11/17    PAST SURGICAL HISTORY :   Past Surgical History:  Procedure Laterality Date   ACHILLES TENDON SURGERY Left    per pt   bone clip removal Left 10/04/2015   knee   CARDIAC SURGERY  11/2012   cath.-per pt   CERVICAL FUSION  07/28/2015   per pt   EPIGASTRIC HERNIA REPAIR     per pt   hematoma removal Left 11/02/2015   knee   HIP SURGERY Right    x8 per pt   IR IMAGING GUIDED PORT INSERTION  11/14/2022   KNEE FUSION Right    KNEE JOINT MANIPULATION Left 09/11/2011   KNEE SURGERY Left    x3   leg stump Left 03/2017   leg surgery-per pt   NECK SURGERY  02/2015   plates-per pt   REPLACEMENT TOTAL KNEE Right    REPLACEMENT TOTAL KNEE Left 07/02/2011   again in 07/10/2011 per pt   s/p of bilateral AKA Right    screen filter removal  10/20/2008   for embolism per pt   screen filter replacement     x3   SHOULDER SURGERY Right    x2-per pt   SPINE SURGERY  02/11/2015   vertebra spacer-per pt   toe nail removal Right    per pt   VARICOSE VEIN SURGERY     x5-per pt    FAMILY HISTORY :   Family History  Problem Relation Age of Onset   Alzheimer's disease Mother    Lung cancer Father     SOCIAL HISTORY:   Social History   Tobacco Use   Smoking status: Never   Smokeless tobacco: Former  Building services engineer status: Never Used  Substance Use Topics   Alcohol use: Yes    Alcohol/week: 3.0 standard drinks of alcohol    Types: 3 Glasses of  wine per week   Drug use: Never    ALLERGIES:  is allergic to bacitracin, vancomycin, ketorolac, and spironolactone.  MEDICATIONS:  Current Outpatient Medications  Medication Sig Dispense Refill   apixaban (ELIQUIS) 5 MG TABS tablet Take 1 tablet (5 mg total) by mouth 2 (two) times daily. 60 tablet 3   buPROPion (WELLBUTRIN XL) 300 MG 24 hr tablet Take 300 mg by mouth daily.     darolutamide (NUBEQA) 300 MG tablet Take 600 mg by mouth 2 (two) times daily with a meal.     gabapentin (NEURONTIN) 300 MG capsule Take 1 capsule (300 mg total) by mouth 3 (three) times daily. 90 capsule 3   lidocaine-prilocaine (EMLA) cream Apply 1 Application topically as needed. 30 g 0   ondansetron (ZOFRAN) 8 MG tablet Take 1 tablet (8 mg total) by mouth every 8 (eight) hours as needed for nausea or vomiting. 60 tablet 1   oxyCODONE-acetaminophen (PERCOCET/ROXICET) 5-325 MG tablet Take 1 tablet by mouth  every 12 (twelve) hours as needed for moderate pain (pain score 4-6). 60 tablet 0   senna-docusate (SENOKOT-S) 8.6-50 MG tablet Take 2 tablets by mouth 2 (two) times daily as needed for mild constipation. 100 tablet 0   lisinopril (ZESTRIL) 20 MG tablet Take 20 mg by mouth 2 (two) times daily.     No current facility-administered medications for this visit.   Facility-Administered Medications Ordered in Other Visits  Medication Dose Route Frequency Provider Last Rate Last Admin   0.9 %  sodium chloride infusion   Intravenous Once Earna Coder, MD       dexamethasone (DECADRON) injection 10 mg  10 mg Intravenous Once Earna Coder, MD       DOCEtaxel (TAXOTERE) 139 mg in sodium chloride 0.9 % 250 mL chemo infusion  60 mg/m2 (Treatment Plan Recorded) Intravenous Once Earna Coder, MD       heparin lock flush 100 unit/mL  500 Units Intracatheter Once PRN Earna Coder, MD       sodium chloride flush (NS) 0.9 % injection 10 mL  10 mL Intracatheter PRN Earna Coder, MD         PHYSICAL EXAMINATION:   BP 136/79 (BP Location: Left Arm, Patient Position: Sitting, Cuff Size: Large)   Pulse 78   Temp 98.5 F (36.9 C) (Tympanic)   SpO2 99%   There were no vitals filed for this visit.     Patient has bilateral AKA.   Physical Exam Vitals and nursing note reviewed.  HENT:     Head: Normocephalic and atraumatic.     Mouth/Throat:     Pharynx: Oropharynx is clear.  Eyes:     Extraocular Movements: Extraocular movements intact.     Pupils: Pupils are equal, round, and reactive to light.  Cardiovascular:     Rate and Rhythm: Normal rate and regular rhythm.  Pulmonary:     Comments: Decreased breath sounds bilaterally.  Abdominal:     Palpations: Abdomen is soft.  Musculoskeletal:        General: Normal range of motion.     Cervical back: Normal range of motion.  Skin:    General: Skin is warm.  Neurological:     General: No focal deficit present.     Mental Status: He is alert and oriented to person, place, and time.  Psychiatric:        Behavior: Behavior normal.        Judgment: Judgment normal.      LABORATORY DATA:  I have reviewed the data as listed    Component Value Date/Time   NA 139 02/04/2023 0911   K 3.3 (L) 02/04/2023 0911   CL 107 02/04/2023 0911   CO2 23 02/04/2023 0911   GLUCOSE 104 (H) 02/04/2023 0911   BUN 15 02/04/2023 0911   CREATININE 0.78 02/04/2023 0911   CALCIUM 8.8 (L) 02/04/2023 0911   PROT 6.1 (L) 02/04/2023 0911   ALBUMIN 3.3 (L) 02/04/2023 0911   AST 26 02/04/2023 0911   ALT 17 02/04/2023 0911   ALKPHOS 113 02/04/2023 0911   BILITOT 1.2 (H) 02/04/2023 0911   GFRNONAA >60 02/04/2023 0911   GFRAA >60 10/19/2016 1335    No results found for: "SPEP", "UPEP"  Lab Results  Component Value Date   WBC 8.1 02/04/2023   NEUTROABS 6.5 02/04/2023   HGB 9.8 (L) 02/04/2023   HCT 29.2 (L) 02/04/2023   MCV 95.1 02/04/2023   PLT 219 02/04/2023  Chemistry      Component Value Date/Time   NA 139  02/04/2023 0911   K 3.3 (L) 02/04/2023 0911   CL 107 02/04/2023 0911   CO2 23 02/04/2023 0911   BUN 15 02/04/2023 0911   CREATININE 0.78 02/04/2023 0911      Component Value Date/Time   CALCIUM 8.8 (L) 02/04/2023 0911   ALKPHOS 113 02/04/2023 0911   AST 26 02/04/2023 0911   ALT 17 02/04/2023 0911   BILITOT 1.2 (H) 02/04/2023 0911       RADIOGRAPHIC STUDIES: I have personally reviewed the radiological images as listed and agreed with the findings in the report. No results found.   ASSESSMENT & PLAN:  Prostate cancer metastatic to bone (HCC) #HIGH RISK/ DE-NOVO CASTRATE SENSITIVE PROSTATE CANCER: CT CAP [JULY 2024]-multiple bone metastases; MRI lumbar spine without contrast  [PCP]-L4-L5 lesion.  On the thecal sac. Pre treatment : PSA 700. AUG 9th, 2024- PSMA PET - Multifocal intense radiotracer activity within the prostate gland consistent with primary prostate adenocarcinoma; Evidence of nodal metastasis with radiotracer avid tiny lymph nodes in the rectal sheath and periaortic retroperitoneum; Extensive radiotracer avid skeletal metastasis with near confluent lesions within the pelvis. Lesions in the lumbar spine, RIGHT scapula, LEFT humerus. Lesions too numerous to count. No evidence of visceral metastasis or pulmonary metastasis; AUG 2024-guardant testing- ATM [eligible for PARPi]. On ADT- [started aug 1st, 2024]. PLAN: ADT + Taxotere+ Darolutamide   # Proceed with cycle #4  of Taxotere [at 60 mg/m2 dose] . Labs-CBC/chemistries were reviewed with the patient slightly elevated Bili sec to [employer] syndrome. With Growth factor-on clairitin bone  pain.   # Neck pain-/Back pain pain medication: Patient currently on oxycodone 1 BID; gabapentin- refilled prescription.  S/p RT-  Dr. Pablo Ledger RT10/03/2022. MRI Neck-metastatic disease in vertebral bodies; no epidural disease. Recommend- evaluation for injection- stable; await Josh appt next week.  Discussed re: moderation of weights/ and avoid  pressure on vertebral column.   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  # IV access: s/p mediport- stable.   # Decub ulcer-  s/p wound care evaluation/ home health evaluation- stable.   Firmagon transitioned to eligard q6 M [02/03/23]  # DISPOSITION: # Taxotere today; # tomorrow- injection; Eligard-  # follow up in 3 weeks- MD; labs- cbc/cmp;PSA; Taxotere-; D2- injection;  Dr.B     Orders Placed This Encounter  Procedures   CBC with Differential (Cancer Center Only)    Standing Status:   Future    Standing Expiration Date:   02/25/2024   CMP (Cancer Center only)    Standing Status:   Future    Standing Expiration Date:   02/25/2024   PSA    Standing Status:   Future    Standing Expiration Date:   02/04/2024   All questions were answered. The patient knows to call the clinic with any problems, questions or concerns.      Earna Coder, MD 02/04/2023 10:56 AM

## 2023-02-05 ENCOUNTER — Other Ambulatory Visit: Payer: Self-pay

## 2023-02-05 ENCOUNTER — Inpatient Hospital Stay: Payer: Medicare PPO

## 2023-02-05 DIAGNOSIS — Z5111 Encounter for antineoplastic chemotherapy: Secondary | ICD-10-CM | POA: Diagnosis not present

## 2023-02-05 DIAGNOSIS — C61 Malignant neoplasm of prostate: Secondary | ICD-10-CM

## 2023-02-05 DIAGNOSIS — C7951 Secondary malignant neoplasm of bone: Secondary | ICD-10-CM

## 2023-02-05 MED ORDER — PEGFILGRASTIM-JMDB 6 MG/0.6ML ~~LOC~~ SOSY
6.0000 mg | PREFILLED_SYRINGE | Freq: Once | SUBCUTANEOUS | Status: AC
Start: 2023-02-05 — End: 2023-02-05
  Administered 2023-02-05: 6 mg via SUBCUTANEOUS
  Filled 2023-02-05: qty 0.6

## 2023-02-05 MED ORDER — LEUPROLIDE ACETATE (6 MONTH) 45 MG ~~LOC~~ KIT
45.0000 mg | PACK | Freq: Once | SUBCUTANEOUS | Status: AC
  Administered 2023-02-05: 45 mg via SUBCUTANEOUS
  Filled 2023-02-05: qty 45

## 2023-02-05 NOTE — Telephone Encounter (Signed)
Oral Oncology Patient Advocate Encounter   Submitted application for assistance for Nubeqa to [employer] [place] [employer].   Application submitted via e-fax to [phone]   [employer]'s phone number [phone].   I will continue to check the status until final determination.    Ardeen Fillers, CPhT Oncology Pharmacy Patient Advocate  [facility]  [phone] (phone) [phone] (fax) 02/05/2023 8:18 AM

## 2023-02-06 ENCOUNTER — Inpatient Hospital Stay: Payer: Medicare PPO | Admitting: Hospice and Palliative Medicine

## 2023-02-06 DIAGNOSIS — L8990 Pressure ulcer of unspecified site, unspecified stage: Secondary | ICD-10-CM | POA: Diagnosis not present

## 2023-02-06 DIAGNOSIS — C7951 Secondary malignant neoplasm of bone: Secondary | ICD-10-CM | POA: Diagnosis not present

## 2023-02-06 DIAGNOSIS — G893 Neoplasm related pain (acute) (chronic): Secondary | ICD-10-CM | POA: Diagnosis not present

## 2023-02-06 DIAGNOSIS — C61 Malignant neoplasm of prostate: Secondary | ICD-10-CM | POA: Diagnosis not present

## 2023-02-06 MED ORDER — APIXABAN 5 MG PO TABS: 5.0000 mg | ORAL_TABLET | Freq: Two times a day (BID) | ORAL | 3 refills | Status: DC

## 2023-02-06 NOTE — Progress Notes (Signed)
Virtual Visit via Telephone Note  I connected with [name] on 02/06/23 at  2:00 PM EST by telephone and verified that I am speaking with the correct person using two identifiers.  Location: Patient: Home Provider: Clinic   I discussed the limitations, risks, security and privacy concerns of performing an evaluation and management service by telephone and the availability of in person appointments. I also discussed with the patient that there may be a patient responsible charge related to this service. The patient expressed understanding and agreed to proceed.   History of Present Illness: [name] is a 67 y.o. 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   Observations/Objective: I called and spoke with patient by phone.  Patient says he is doing about the same.  Denies significant changes or concerns.  Continues to endorse intermittent phantom limb pain.  Symptoms do appear to be improved after dose increase of gabapentin to 300 mg 3 times daily.  Discussed option of further dose increase but the patient is pending evaluation by neuro oncology later this month.  Patient is now being followed by the wound center and has a home health RN managing his thigh pressure wounds.  He has an upcoming appointment with ophthalmology on Friday.  Patient was referred to the pain clinic for consideration of ESI but they were unable to reach him.  Patient had some questions about billing/financial resources. Pending SW eval.   Assessment and Plan: Stage IV prostate cancer -on docetaxel and Firmagon  Phantom limb pain -gabapentin 300 mg 3 times daily.   Thigh Ulcers - Home health RN and wound center managing.   Neoplasm-related pain - patient pending referral to the pain clinic for consideration of ESI.  Continue as needed Percocet  Financial Concerns - referral to SW  Follow Up Instructions: Follow up telephone visit 1-2 months    I discussed the assessment and treatment plan with the patient. The patient was provided an opportunity to ask questions and all were answered. The patient agreed with the plan and demonstrated an understanding of the instructions.   The patient was advised to call back or seek an in-person evaluation if the symptoms worsen or if the condition fails to improve as anticipated.  I provided 15 minutes of non-face-to-face time during this encounter.   Malachy Moan, NP

## 2023-02-07 ENCOUNTER — Other Ambulatory Visit: Payer: Self-pay

## 2023-02-07 ENCOUNTER — Inpatient Hospital Stay: Payer: Medicare PPO

## 2023-02-07 NOTE — Progress Notes (Signed)
[facility] CSW Progress Note  Clinical Child psychotherapist contacted patient by phone to assess needs per the request of [employer].  Patient expressed financial strain.  Provided contact information for the [employer].  Explored supportive organizations, including, [employer] [place] and [id number] for prostate cancer.  Patient does not qualify for the [facility].  Provided active listening and supportive counseling.    Dorothey Baseman, LCSW Clinical Social Worker [facility]

## 2023-02-11 ENCOUNTER — Encounter: Payer: Medicare PPO | Attending: Physician Assistant | Admitting: Physician Assistant

## 2023-02-11 DIAGNOSIS — Z7901 Long term (current) use of anticoagulants: Secondary | ICD-10-CM | POA: Diagnosis not present

## 2023-02-11 DIAGNOSIS — I7389 Other specified peripheral vascular diseases: Secondary | ICD-10-CM | POA: Diagnosis not present

## 2023-02-11 DIAGNOSIS — L89323 Pressure ulcer of left buttock, stage 3: Secondary | ICD-10-CM | POA: Insufficient documentation

## 2023-02-11 DIAGNOSIS — Z89611 Acquired absence of right leg above knee: Secondary | ICD-10-CM | POA: Diagnosis not present

## 2023-02-11 DIAGNOSIS — I10 Essential (primary) hypertension: Secondary | ICD-10-CM | POA: Diagnosis present

## 2023-02-11 DIAGNOSIS — C61 Malignant neoplasm of prostate: Secondary | ICD-10-CM | POA: Diagnosis present

## 2023-02-11 DIAGNOSIS — Z86718 Personal history of other venous thrombosis and embolism: Secondary | ICD-10-CM | POA: Diagnosis not present

## 2023-02-11 DIAGNOSIS — Z89612 Acquired absence of left leg above knee: Secondary | ICD-10-CM | POA: Insufficient documentation

## 2023-02-11 NOTE — Progress Notes (Signed)
[name], [name] ([id number]) [id number] Page 1 of 8 Visit Report for 02/11/2023 Chief Complaint Document Details Patient Name: Date of Service: [name], [name] 02/11/2023 8:45 A M Medical Record Number: [id number] Patient Account Number: [id number] Date of Birth/Sex: Treating RN: March 28, 1955 (67 y.o. [name]) Yevonne Pax Primary Care Provider: Rolin Barry Other Clinician: Referring Provider: Treating Provider/Extender: Vanessa Ralphs in Treatment: 3 Information Obtained from: Patient Chief Complaint Pressure ulcer left buttock stage 3 Electronic Signature(s) Signed: 02/11/2023 8:35:15 AM By: Allen Derry PA-C Entered By: Allen Derry on 02/11/2023 05:35:14 -------------------------------------------------------------------------------- Debridement Details Patient Name: Date of Service: [name], [name] 02/11/2023 8:45 A M Medical Record Number: [id number] Patient Account Number: [id number] Date of Birth/Sex: Treating RN: Jan 12, 1956 (67 y.o. [name]) Yevonne Pax Primary Care Provider: Rolin Barry Other Clinician: Referring Provider: Treating Provider/Extender: Vanessa Ralphs in Treatment: 3 Debridement Performed for Assessment: Wound #4 Left Gluteus Performed By: Physician Allen Derry, PA-C The following information was scribed by: Yevonne Pax The information was scribed for: Allen Derry Debridement Type: Debridement Level of Consciousness (Pre-procedure): Awake and Alert Pre-procedure Verification/Time Out Yes - 09:40 Taken: Start Time: 09:40 Percent of Wound Bed Debrided: 100% T Area Debrided (cm): otal 4.12 Tissue and other material debrided: Viable, Non-Viable, Subcutaneous, Skin: Dermis , Skin: Epidermis, Biofilm Level: Skin/Subcutaneous Tissue Debridement Description: Excisional Instrument: Curette Bleeding: Minimum Hemostasis Achieved: Pressure End Time: 09:45 ASPLUND, Marcos ([id number])  [id number] Page 2 of 8 Procedural Pain: 0 Post Procedural Pain: 0 Response to Treatment: Procedure was tolerated well Level of Consciousness (Post- Awake and Alert procedure): Post Debridement Measurements of Total Wound Length: (cm) 2.5 Stage: Category/Stage III Width: (cm) 2.1 Depth: (cm) 0.1 Volume: (cm) 0.412 Character of Wound/Ulcer Post Debridement: Improved Post Procedure Diagnosis Same as Pre-procedure Electronic Signature(s) Signed: 02/11/2023 11:30:55 AM By: Yevonne Pax RN Signed: 02/11/2023 4:09:48 PM By: Demetria Pore Signed: 02/13/2023 7:54:12 PM By: Allen Derry PA-C Entered By: Yevonne Pax on 02/11/2023 08:30:55 -------------------------------------------------------------------------------- HPI Details Patient Name: Date of Service: [name], [name] 02/11/2023 8:45 A M Medical Record Number: [id number] Patient Account Number: [id number] Date of Birth/Sex: Treating RN: 1955-08-13 (67 y.o. [name] Primary Care Provider: Rolin Barry Other Clinician: Referring Provider: Treating Provider/Extender: Vanessa Ralphs in Treatment: 3 History of Present Illness HPI Description: Readmission: 01-21-2023 upon evaluation today patient appears to be doing poorly currently in regard to a wound over the left gluteal location. He tells me that this is somewhat painful. It has been present since around the beginning of November as best he knows and can remember. Fortunately there does not appear to be any signs of active infection at this time which is great news. With that being said there is some necrotic tissue he is going require some sharp debridement. He actually has an acquaintance who was able to bring him in today but transportation is a significant issue for him. He tells me that it is very difficult for him to Get to the clinic and due to the fact that he does not have transportation and his insurance does not provide this.  Subsequently he at this point tells me that he pretty much has not been out of his house until this past summer for about 2-1/2 years he has his food delivered and he really does not go anywhere. Patient has a history of malignant prostate cancer, hypertension, peripheral vascular disease, bilateral above-knee amputations, history of DVT and he is on , long-term anticoagulant therapy. 02-11-2023 upon evaluation today patient  appears to be doing well currently in regard to his wound which is actually showing some signs of improvement there is some slough and biofilm buildup I am going to perform some debridement to clear away the necrotic debris and he is tolerated that debridement today. Electronic Signature(s) Signed: 02/11/2023 10:13:01 AM By: Allen Derry PA-C Entered By: Allen Derry on 02/11/2023 07:13:01 Connelly, Denman ([id number]) [id number] Page 3 of 8 -------------------------------------------------------------------------------- Physical Exam Details Patient Name: Date of Service: [name], [name] 02/11/2023 8:45 A M Medical Record Number: [id number] Patient Account Number: [id number] Date of Birth/Sex: Treating RN: 01-05-1956 (67 y.o. [name]) Yevonne Pax Primary Care Provider: Rolin Barry Other Clinician: Referring Provider: Treating Provider/Extender: Vanessa Ralphs in Treatment: 3 Constitutional Well-nourished and well-hydrated in no acute distress. Respiratory normal breathing without difficulty. Psychiatric this patient is able to make decisions and demonstrates good insight into disease process. Alert and Oriented x 3. pleasant and cooperative. Notes Upon inspection patient's wound bed actually showed signs of good granulation epithelization at this point. Fortunately I do not see any signs of worsening overall I believe that the patient is going to continue with the plan with the collagen this looks to be doing well for him and I am  very pleased. I do think that it is measuring smaller compared to where we were previous which is good news as well. In fact it is about close to half the size. Electronic Signature(s) Signed: 02/11/2023 10:13:32 AM By: Allen Derry PA-C Entered By: Allen Derry on 02/11/2023 07:13:32 -------------------------------------------------------------------------------- Physician Orders Details Patient Name: Date of Service: [name], Jawon 02/11/2023 8:45 A M Medical Record Number: [id number] Patient Account Number: [id number] Date of Birth/Sex: Treating RN: [birth year] (67 y.o. [name]) Yevonne Pax Primary Care Provider: Rolin Barry Other Clinician: Referring Provider: Treating Provider/Extender: Vanessa Ralphs in Treatment: 3 The following information was scribed by: Yevonne Pax The information was scribed for: Allen Derry Verbal / Phone Orders: No Diagnosis Coding ICD-10 Coding Code Description [phone] Pressure ulcer of left buttock, stage 3 C61 Malignant neoplasm of prostate I10 Essential (primary) hypertension I73.89 Other specified peripheral vascular diseases Z89.611 Acquired absence of right leg above knee Ketcher, Harshan ([id number]) [id number] Page 4 of 8 [phone] Acquired absence of left leg above knee Z86.718 Personal history of other venous thrombosis and embolism Z79.01 Long term (current) use of anticoagulants Follow-up Appointments Return Appointment in 3 weeks. Home Health [facility] for wound care. May utilize formulary equivalent dressing for wound treatment orders unless otherwise specified. Home Health Nurse may visit PRN to address patients wound care needs. Rolene Arbour [phone] Bathing/ Shower/ Hygiene No tub bath. Anesthetic (Use 'Patient Medications' Section for Anesthetic Order Entry) Lidocaine applied to wound bed Off-Loading Gel wheelchair cushion Turn and reposition every 2 hours Other: - roho for  chair Wound Treatment Wound #4 - Gluteus Wound Laterality: Left Cleanser: Byram Ancillary Kit - 15 Day Supply (Generic) 3 x Per Week/30 Days Discharge Instructions: Use supplies as instructed; Kit contains: (15) Saline Bullets; (15) 3x3 Gauze; 15 pr Gloves Cleanser: Normal Saline 3 x Per Week/30 Days Discharge Instructions: Wash your hands with soap and water. Remove old dressing, discard into plastic bag and place into trash. Cleanse the wound with Normal Saline prior to applying a clean dressing using gauze sponges, not tissues or cotton balls. Do not scrub or use excessive force. Pat dry using gauze sponges, not tissue or cotton balls. Cleanser: Soap and Water 3 x Per Week/30 Days Discharge Instructions:  Gently cleanse wound with antibacterial soap, rinse and pat dry prior to dressing wounds Prim Dressing: Promogran Matrix 4.34 (in) (Generic) 3 x Per Week/30 Days ary Discharge Instructions: Moisten w/normal saline or sterile water; Cover wound as directed. Do not remove from wound bed. Secondary Dressing: (BORDER) Zetuvit Plus SILICONE BORDER Dressing 5x5 (in/in) (Generic) 3 x Per Week/30 Days Discharge Instructions: Please do not put silicone bordered dressings under wraps. Use non-bordered dressing only. Electronic Signature(s) Signed: 02/11/2023 4:09:48 PM By: Demetria Pore Signed: 02/12/2023 3:25:36 PM By: Yevonne Pax RN Signed: 02/13/2023 7:54:12 PM By: Allen Derry PA-C Entered By: Yevonne Pax on 02/11/2023 07:57:06 -------------------------------------------------------------------------------- Problem List Details Patient Name: Date of Service: [name], Tysean 02/11/2023 8:45 A M Medical Record Number: [id number] Patient Account Number: [id number] Date of Birth/Sex: Treating RN: [birth year] (67 y.o. [name] Primary Care Provider: Rolin Barry Other Clinician: Referring Provider: Treating Provider/Extender: Vanessa Ralphs in Treatment: 3 Active  Problems ICD-10 Encounter REEDE, Laurier ([id number]) [id number] Page 5 of 8 Encounter Code Description Active Date MDM Diagnosis L89.323 Pressure ulcer of left buttock, stage 3 01/21/2023 No Yes C61 Malignant neoplasm of prostate 01/21/2023 No Yes I10 Essential (primary) hypertension 01/21/2023 No Yes I73.89 Other specified peripheral vascular diseases 01/21/2023 No Yes Z89.611 Acquired absence of right leg above knee 01/21/2023 No Yes Z89.612 Acquired absence of left leg above knee 01/21/2023 No Yes Z86.718 Personal history of other venous thrombosis and embolism 01/21/2023 No Yes Z79.01 Long term (current) use of anticoagulants 01/21/2023 No Yes Inactive Problems Resolved Problems Electronic Signature(s) Signed: 02/11/2023 8:35:12 AM By: Allen Derry PA-C Entered By: Allen Derry on 02/11/2023 05:35:12 -------------------------------------------------------------------------------- Progress Note Details Patient Name: Date of Service: [name], Yuuki 02/11/2023 8:45 A M Medical Record Number: [id number] Patient Account Number: [id number] Date of Birth/Sex: Treating RN: [birth year] (67 y.o. [name]) Yevonne Pax Primary Care Provider: Rolin Barry Other Clinician: Referring Provider: Treating Provider/Extender: Vanessa Ralphs in Treatment: 3 Subjective Chief Complaint Information obtained from Patient Pressure ulcer left buttock stage 3 History of Present Illness (HPI) Readmission: 01-21-2023 upon evaluation today patient appears to be doing poorly currently in regard to a wound over the left gluteal location. He tells me that this is BAUSER, Jerrid ([id number]) [id number] Page 6 of 8 somewhat painful. It has been present since around the beginning of November as best he knows and can remember. Fortunately there does not appear to be any signs of active infection at this time which is great news. With that being said  there is some necrotic tissue he is going require some sharp debridement. He actually has an acquaintance who was able to bring him in today but transportation is a significant issue for him. He tells me that it is very difficult for him to Get to the clinic and due to the fact that he does not have transportation and his insurance does not provide this. Subsequently he at this point tells me that he pretty much has not been out of his house until this past summer for about 2-1/2 years he has his food delivered and he really does not go anywhere. Patient has a history of malignant prostate cancer, hypertension, peripheral vascular disease, bilateral above-knee amputations, history of DVT and he is on , long-term anticoagulant therapy. 02-11-2023 upon evaluation today patient appears to be doing well currently in regard to his wound which is actually showing some signs of improvement there is some slough and biofilm buildup I am going to perform some  debridement to clear away the necrotic debris and he is tolerated that debridement today. Objective Constitutional Well-nourished and well-hydrated in no acute distress. Vitals Time Taken: 8:56 AM, Height: 59 in, Weight: 288 lbs, BMI: 58.2, Temperature: 98.5 F, Pulse: 72 bpm, Respiratory Rate: 18 breaths/min, Blood Pressure: 142/80 mmHg. Respiratory normal breathing without difficulty. Psychiatric this patient is able to make decisions and demonstrates good insight into disease process. Alert and Oriented x 3. pleasant and cooperative. General Notes: Upon inspection patient's wound bed actually showed signs of good granulation epithelization at this point. Fortunately I do not see any signs of worsening overall I believe that the patient is going to continue with the plan with the collagen this looks to be doing well for him and I am very pleased. I do think that it is measuring smaller compared to where we were previous which is good news as well. In  fact it is about close to half the size. Integumentary (Hair, Skin) Wound #4 status is Open. Original cause of wound was Pressure Injury. The date acquired was: 01/04/2023. The wound has been in treatment 3 weeks. The wound is located on the Left Gluteus. The wound measures 2.5cm length x 2.1cm width x 0.1cm depth; 4.123cm^2 area and 0.412cm^3 volume. There is Fat Layer (Subcutaneous Tissue) exposed. There is no tunneling or undermining noted. There is a medium amount of serosanguineous drainage noted. There is medium (34-66%) pink granulation within the wound bed. There is a medium (34-66%) amount of necrotic tissue within the wound bed. 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 There was a Excisional Skin/Subcutaneous Tissue Debridement with a total area of 4.12 sq cm performed by Allen Derry, PA-C. With the following instrument(s): Curette to remove Viable and Non-Viable tissue/material. Material removed includes Subcutaneous Tissue, Skin: Dermis, Skin: Epidermis, and Biofilm. No specimens were taken. A time out was conducted at 09:40, prior to the start of the procedure. A Minimum amount of bleeding was controlled with Pressure. The procedure was tolerated well with a pain level of 0 throughout and a pain level of 0 following the procedure. Post Debridement Measurements: 2.5cm length x 2.1cm width x 0.1cm depth; 0.412cm^3 volume. Post debridement Stage noted as Category/Stage III. Character of Wound/Ulcer Post Debridement is improved. Post procedure Diagnosis Wound #4:  Same as Pre-Procedure Plan OLINSKI, Gayland ([id number]) [id number] Page 7 of 8 Follow-up Appointments: Return Appointment in 3 weeks. Home Health: [facility] for wound care. May utilize formulary equivalent dressing for wound treatment orders unless otherwise specified. Home Health Nurse may visit PRN to address patients wound care needs. Aldine Contes [phone] Bathing/ Shower/ Hygiene: No tub bath. Anesthetic (Use 'Patient Medications' Section for Anesthetic Order Entry): Lidocaine applied to wound bed WOUND #4: - Gluteus Wound Laterality: Left Cleanser: Byram Ancillary Kit - 15 Day Supply (Generic) 3 x Per Week/30 Days Discharge Instructions: Use supplies as instructed; Kit contains: (15) Saline Bullets; (15) 3x3 Gauze; 15 pr Gloves Cleanser: Normal Saline 3 x Per Week/30 Days Discharge Instructions: Wash your hands with soap and water. Remove old dressing, discard into plastic bag and place into trash. Cleanse the wound with Normal Saline  prior to applying a clean dressing using gauze sponges, not tissues or cotton balls. Do not scrub or use excessive force. Pat dry using gauze sponges, not tissue or cotton balls. Cleanser: Soap and Water 3 x Per Week/30 Days Discharge Instructions: Gently cleanse wound with antibacterial soap, rinse and pat dry prior to dressing wounds Prim Dressing: Promogran Matrix 4.34 (in) (Generic) 3 x Per Week/30 Days ary Discharge Instructions: Moisten w/normal saline or sterile water; Cover wound as directed. Do not remove from wound bed. Secondary Dressing: (BORDER) Zetuvit Plus SILICONE BORDER Dressing 5x5 (in/in) (Generic) 3 x Per Week/30 Days Discharge Instructions: Please do not put silicone bordered dressings under wraps. Use non-bordered dressing only. 1. I would recommend the patient should continue to monitor for any signs of infection or worsening. Based on what I see I do believe that he is doing well with the  collagen we can continue with that and having home health help him. 2. I am also going to recommend continue with the Zetuvit bordered foam dressing to cover. We will see patient back for reevaluation in 1 week here in the clinic. If anything worsens or changes patient will contact our office for additional recommendations. Electronic Signature(s) Signed: 02/11/2023 10:13:54 AM By: Allen Derry PA-C Entered By: Allen Derry on 02/11/2023 07:13:54 -------------------------------------------------------------------------------- SuperBill Details Patient Name: Date of Service: [name], Joandry 02/11/2023 Medical Record Number: [id number] Patient Account Number: [id number] Date of Birth/Sex: Treating RN: 04-25-1955 (67 y.o. [name]) Yevonne Pax Primary Care Provider: Rolin Barry Other Clinician: Referring Provider: Treating Provider/Extender: Vanessa Ralphs in Treatment: 3 Diagnosis Coding ICD-10 Codes Code Description [phone] Pressure ulcer of left buttock, stage 3 C61 Malignant neoplasm of prostate I10 Essential (primary) hypertension I73.89 Other specified peripheral vascular diseases Z89.611 Acquired absence of right leg above knee Z89.612 Acquired absence of left leg above knee Z86.718 Personal history of other venous thrombosis and embolism Z79.01 Long term (current) use of anticoagulants Facility Procedures : VIRAAT, SOUDER Code: [id number] [place] ([id number]) ICD- L8 Description: 11042 - DEB SUBQ TISSUE 20 SQ CM/< ICD-10 Diagnosis Description [id number] Diagnosis Description 9.323 Pressure ulcer of left buttock, stage 3 Modifier: [id number] Page 8 of 8 Quantity: 1 Physician Procedures : CPT4 Code Description Modifier 11042 11042 - WC PHYS SUBQ TISS 20 SQ CM ICD-10 Diagnosis Description L89.323 Pressure ulcer of left buttock, stage 3 Quantity: 1 Electronic Signature(s) Signed: 02/11/2023 10:14:04 AM By: Allen Derry PA-C Entered By: Allen Derry on  02/11/2023 07:14:04

## 2023-02-11 NOTE — Progress Notes (Addendum)
[name], [name] ([id number]) [id number] Page 1 of 9 Visit Report for 02/11/2023 Arrival Information Details Patient Name: Date of Service: [name], [name] 02/11/2023 8:45 A M Medical Record Number: [id number] Patient Account Number: [id number] Date of Birth/Sex: Treating RN: 05-25-1955 (67 y.o. [name]) Yevonne Pax Primary Care Bettymae Yott: Rolin Barry Other Clinician: Referring Nami Strawder: Treating Sindi Beckworth/Extender: Vanessa Ralphs in Treatment: 3 Visit Information History Since Last Visit Added or deleted any medications: No Patient Arrived: Wheel Chair Any new allergies or adverse reactions: No Arrival Time: 08:55 Had a fall or experienced change in No Accompanied By: friend activities of daily living that may affect Transfer Assistance: None risk of falls: Patient Identification Verified: Yes Signs or symptoms of abuse/neglect since last visito No Secondary Verification Process Completed: Yes Hospitalized since last visit: No Patient Requires Transmission-Based Precautions: No Implantable device outside of the clinic excluding No Patient Has Alerts: Yes cellular tissue based products placed in the center Patient Alerts: Patient on Blood Thinner since last visit: Has Dressing in Place as Prescribed: Yes Pain Present Now: No Electronic Signature(s) Signed: 02/12/2023 3:25:36 PM By: Yevonne Pax RN Entered By: Yevonne Pax on 02/11/2023 05:56:00 -------------------------------------------------------------------------------- Clinic Level of Care Assessment Details Patient Name: Date of Service: [name], Momen 02/11/2023 8:45 A M Medical Record Number: [id number] Patient Account Number: [id number] Date of Birth/Sex: Treating RN: [birth year] (67 y.o. [name]) Yevonne Pax Primary Care Zvi Duplantis: Rolin Barry Other Clinician: Referring Ivon Oelkers: Treating Calhoun Reichardt/Extender: Vanessa Ralphs in Treatment: 3 Clinic Level of Care Assessment  Items TOOL 2 Quantity Score X- 1 0 Use when only an EandM is performed on the INITIAL visit ASSESSMENTS - Nursing Assessment / Reassessment X- 1 20 General Physical Exam (combine w/ comprehensive assessment (listed just below) when performed on new pt. evals) X- 1 25 Comprehensive Assessment (HX, ROS, Risk Assessments, Wounds Hx, etc.) [name], [name] ([id number]) [id number] Page 2 of 9 ASSESSMENTS - Wound and Skin A ssessment / Reassessment []  - Simple Wound Assessment / Reassessment - one wound 0 []  - 0 Complex Wound Assessment / Reassessment - multiple wounds []  - 0 Dermatologic / Skin Assessment (not related to wound area) ASSESSMENTS - Ostomy and/or Continence Assessment and Care []  - 0 Incontinence Assessment and Management []  - 0 Ostomy Care Assessment and Management (repouching, etc.) PROCESS - Coordination of Care []  - 0 Simple Patient / Family Education for ongoing care []  - 0 Complex (extensive) Patient / Family Education for ongoing care []  - 0 Staff obtains Chiropractor, Records, T Results / Process Orders est []  - 0 Staff telephones HHA, Nursing Homes / Clarify orders / etc []  - 0 Routine Transfer to another Facility (non-emergent condition) []  - 0 Routine Hospital Admission (non-emergent condition) X- 1 15 New Admissions / Manufacturing engineer / Ordering NPWT Apligraf, etc. , []  - 0 Emergency Hospital Admission (emergent condition) X- 1 10 Simple Discharge Coordination []  - 0 Complex (extensive) Discharge Coordination PROCESS - Special Needs []  - 0 Pediatric / Minor Patient Management []  - 0 Isolation Patient Management []  - 0 Hearing / Language / Visual special needs []  - 0 Assessment of Community assistance (transportation, D/C planning, etc.) []  - 0 Additional assistance / Altered mentation []  - 0 Support Surface(s) Assessment (bed, cushion, seat, etc.) INTERVENTIONS - Wound Cleansing / Measurement []  - 0 Wound  Imaging (photographs - any number of wounds) []  - 0 Wound Tracing (instead of photographs) []  - 0 Simple Wound Measurement - one wound []  - 0 Complex Wound Measurement -  multiple wounds []  - 0 Simple Wound Cleansing - one wound []  - 0 Complex Wound Cleansing - multiple wounds INTERVENTIONS - Wound Dressings []  - 0 Small Wound Dressing one or multiple wounds []  - 0 Medium Wound Dressing one or multiple wounds []  - 0 Large Wound Dressing one or multiple wounds []  - 0 Application of Medications - injection INTERVENTIONS - Miscellaneous []  - 0 External ear exam []  - 0 Specimen Collection (cultures, biopsies, blood, body fluids, etc.) []  - 0 Specimen(s) / Culture(s) sent or taken to Lab for analysis []  - 0 Patient Transfer (multiple staff / Nurse, adult / Similar devices) []  - 0 Simple Staple / Suture removal (25 or less) []  - 0 Complex Staple / Suture removal (26 or more) []  - 0 Hypo / Hyperglycemic Management (close monitor of Blood Glucose) [name], [name] ([id number]) [id number] Page 3 of 9 []  - 0 Ankle / Brachial Index (ABI) - do not check if billed separately Has the patient been seen at the hospital within the last three years: Yes Total Score: 70 Level Of Care: New/Established - Level 2 Electronic Signature(s) Signed: 02/12/2023 3:25:36 PM By: Yevonne Pax RN Entered By: Yevonne Pax on 02/11/2023 07:57:57 -------------------------------------------------------------------------------- Encounter Discharge Information Details Patient Name: Date of Service: [name], [name] 02/11/2023 8:45 A M Medical Record Number: [id number] Patient Account Number: [id number] Date of Birth/Sex: Treating RN: 11-12-1955 (67 y.o. [name]) Yevonne Pax Primary Care Zamarion Longest: Rolin Barry Other Clinician: Referring Ivyrose Hashman: Treating Ival Pacer/Extender: Vanessa Ralphs in Treatment: 3 Encounter Discharge Information Items Post Procedure  Vitals Discharge Condition: Stable Temperature (F): 98.5 Ambulatory Status: Wheelchair Pulse (bpm): 77 Discharge Destination: Home Respiratory Rate (breaths/min): 18 Transportation: Private Auto Blood Pressure (mmHg): 142/80 Accompanied By: friend Schedule Follow-up Appointment: Yes Clinical Summary of Care: Electronic Signature(s) Signed: 02/11/2023 11:32:55 AM By: Yevonne Pax RN Entered By: Yevonne Pax on 02/11/2023 08:32:55 -------------------------------------------------------------------------------- Lower Extremity Assessment Details Patient Name: Date of Service: [name], Bevin 02/11/2023 8:45 A M Medical Record Number: [id number] Patient Account Number: [id number] Date of Birth/Sex: Treating RN: 06/15/1955 (67 y.o. [name]) Yevonne Pax Primary Care Bela Bonaparte: Rolin Barry Other Clinician: Referring Camarion Weier: Treating Kaytlen Lightsey/Extender: Vanessa Ralphs in Treatment: 3 Electronic Signature(s) Signed: 02/12/2023 3:25:36 PM By: Yevonne Pax RN Entered By: Yevonne Pax on 02/11/2023 06:04:27 Aufiero, Nahsir ([id number]) [id number] Page 4 of 9 -------------------------------------------------------------------------------- Multi Wound Chart Details Patient Name: Date of Service: [name], Jiyaan 02/11/2023 8:45 A M Medical Record Number: [id number] Patient Account Number: [id number] Date of Birth/Sex: Treating RN: 1955/06/11 (67 y.o. [name]) Yevonne Pax Primary Care Flois Mctague: Rolin Barry Other Clinician: Referring Tonyia Marschall: Treating Aurther Harlin/Extender: Vanessa Ralphs in Treatment: 3 Vital Signs Height(in): 59 Pulse(bpm): 72 Weight(lbs): 288 Blood Pressure(mmHg): 142/80 Body Mass Index(BMI): 58.2 Temperature(F): 98.5 Respiratory Rate(breaths/min): 18 [4:Photos:] [N/A:N/A] Left Gluteus N/A N/A Wound Location: Pressure Injury N/A N/A Wounding Event: Pressure Ulcer N/A N/A Primary Etiology: Deep Vein Thrombosis,  Peripheral N/A N/A Comorbid History: Venous Disease, Neuropathy, Received Chemotherapy, Received Radiation 01/04/2023 N/A N/A Date Acquired: 3 N/A N/A Weeks of Treatment: Open N/A N/A Wound Status: No N/A N/A Wound Recurrence: 2.5x2.1x0.1 N/A N/A Measurements L x W x D (cm) 4.123 N/A N/A A (cm) : rea 0.412 N/A N/A Volume (cm) : 47.50% N/A N/A % Reduction in A rea: 47.50% N/A N/A % Reduction in Volume: Category/Stage III N/A N/A Classification: Medium N/A N/A Exudate A mount: Serosanguineous N/A N/A Exudate Type: red, brown N/A N/A Exudate Color: Medium (34-66%) N/A N/A Granulation  A mount: Pink N/A N/A Granulation Quality: Medium (34-66%) N/A N/A Necrotic A mount: Fat Layer (Subcutaneous Tissue): Yes N/A N/A Exposed Structures: Fascia: No Tendon: No Muscle: No Joint: No Bone: No None N/A N/A Epithelialization: Treatment Notes Electronic Signature(s) Signed: 02/12/2023 3:25:36 PM By: Yevonne Pax RN Entered By: Yevonne Pax on 02/11/2023 06:04:31 Stash, Adarius ([id number]) [id number] Page 5 of 9 -------------------------------------------------------------------------------- Multi-Disciplinary Care Plan Details Patient Name: Date of Service: [name], Hildred 02/11/2023 8:45 A M Medical Record Number: [id number] Patient Account Number: [id number] Date of Birth/Sex: Treating RN: Aug 28, 1955 (67 y.o. [name]) Yevonne Pax Primary Care Lisette Mancebo: Rolin Barry Other Clinician: Referring Marvin Maenza: Treating Addalynne Golding/Extender: Vanessa Ralphs in Treatment: 3 Active Inactive Necrotic Tissue Nursing Diagnoses: Knowledge deficit related to management of necrotic/devitalized tissue Goals: Patient/caregiver will verbalize understanding of reason and process for debridement of necrotic tissue Date Initiated: 01/21/2023 Target Resolution Date: 02/20/2023 Goal Status: Active Interventions: Assess patient pain level pre-, during and  post procedure and prior to discharge Notes: Wound/Skin Impairment Nursing Diagnoses: Knowledge deficit related to ulceration/compromised skin integrity Goals: Patient/caregiver will verbalize understanding of skin care regimen Date Initiated: 01/21/2023 Target Resolution Date: 02/20/2023 Goal Status: Active Ulcer/skin breakdown will have a volume reduction of 30% by week 4 Date Initiated: 01/21/2023 Target Resolution Date: 02/20/2023 Goal Status: Active Ulcer/skin breakdown will have a volume reduction of 50% by week 8 Date Initiated: 01/21/2023 Target Resolution Date: 03/23/2023 Goal Status: Active Ulcer/skin breakdown will have a volume reduction of 80% by week 12 Date Initiated: 01/21/2023 Target Resolution Date: 04/23/2023 Goal Status: Active Ulcer/skin breakdown will heal within 14 weeks Date Initiated: 01/21/2023 Target Resolution Date: 05/21/2023 Goal Status: Active Interventions: Assess patient/caregiver ability to obtain necessary supplies Assess patient/caregiver ability to perform ulcer/skin care regimen upon admission and as needed Assess ulceration(s) every visit Notes: Electronic Signature(s) Signed: 02/12/2023 3:25:36 PM By: Yevonne Pax RN Entered By: Yevonne Pax on 02/11/2023 06:04:43 Nardelli, Marin ([id number]) [id number] Page 6 of 9 -------------------------------------------------------------------------------- Pain Assessment Details Patient Name: Date of Service: [name], Rishik 02/11/2023 8:45 A M Medical Record Number: [id number] Patient Account Number: [id number] Date of Birth/Sex: Treating RN: [birth year] (67 y.o. [name]) Yevonne Pax Primary Care Casondra Gasca: Rolin Barry Other Clinician: Referring Mahagony Grieb: Treating Maddalynn Barnard/Extender: Vanessa Ralphs in Treatment: 3 Active Problems Location of Pain Severity and Description of Pain Patient Has Paino No Site Locations Pain Management and Medication Current Pain  Management: Electronic Signature(s) Signed: 02/12/2023 3:25:36 PM By: Yevonne Pax RN Entered By: Yevonne Pax on 02/11/2023 06:02:33 -------------------------------------------------------------------------------- Patient/Caregiver Education Details Patient Name: Date of Service: [name], Spence 12/9/2024andnbsp8:45 A M Medical Record Number: [id number] Patient Account Number: [id number] Date of Birth/Gender: Treating RN: 01-26-1956 (67 y.o. Melonie [place] Primary Care Physician: Rolin Barry Other Clinician: Referring Physician: Treating Physician/Extender: Vanessa Ralphs in Treatment: 3 Hyneman, Lott ([id number]) [phone].pdf Page 7 of 9 Education Assessment Education Provided To: Patient Education Topics Provided Pressure: Handouts: Pressure Injury: Prevention and Offloading Methods: Explain/Verbal Responses: State content correctly Electronic Signature(s) Signed: 02/12/2023 3:25:36 PM By: Yevonne Pax RN Entered By: Yevonne Pax on 02/11/2023 06:05:18 -------------------------------------------------------------------------------- Wound Assessment Details Patient Name: Date of Service: [name], Tomas 02/11/2023 8:45 A M Medical Record Number: [id number] Patient Account Number: [id number] Date of Birth/Sex: Treating RN: Oct 17, 1955 (67 y.o. Melonie [place] Primary Care Candler Ginsberg: Rolin Barry Other Clinician: Referring Toni Hoffmeister: Treating Kaidon Kinker/Extender: Vanessa Ralphs in Treatment: 3 Wound Status Wound Number: 4 Primary Pressure Ulcer Etiology: Wound Location: Left Gluteus Wound  Open Wounding Event: Pressure Injury Status: Date Acquired: 01/04/2023 Comorbid Deep Vein Thrombosis, Peripheral Venous Disease, Neuropathy, Weeks Of Treatment: 3 History: Received Chemotherapy, Received Radiation Clustered Wound: No Photos Wound Measurements Length: (cm) 2.5 Width: (cm) 2.1 Depth: (cm) 0.1 Area: (cm)  4.123 Volume: (cm) 0.412 % Reduction in Area: 47.5% % Reduction in Volume: 47.5% Epithelialization: None Tunneling: No Undermining: No Wound Description Classification: Category/Stage III Exudate Amount: Medium Exudate Type: Serosanguineous Eschete, Zykee ([id number]) Exudate Color: red, brown Foul Odor After Cleansing: No Slough/Fibrino Yes [id number] Page 8 of 9 Wound Bed Granulation Amount: Medium (34-66%) Exposed Structure Granulation Quality: Pink Fascia Exposed: No Necrotic Amount: Medium (34-66%) Fat Layer (Subcutaneous Tissue) Exposed: Yes Tendon Exposed: No Muscle Exposed: No Joint Exposed: No Bone Exposed: No Treatment Notes Wound #4 (Gluteus) Wound Laterality: Left Cleanser Byram Ancillary Kit - 15 Day Supply Discharge Instruction: Use supplies as instructed; Kit contains: (15) Saline Bullets; (15) 3x3 Gauze; 15 pr Gloves Normal Saline Discharge Instruction: Wash your hands with soap and water. Remove old dressing, discard into plastic bag and place into trash. Cleanse the wound with Normal Saline prior to applying a clean dressing using gauze sponges, not tissues or cotton balls. Do not scrub or use excessive force. Pat dry using gauze sponges, not tissue or cotton balls. Soap and Water Discharge Instruction: Gently cleanse wound with antibacterial soap, rinse and pat dry prior to dressing wounds Peri-Wound Care Topical Primary Dressing Promogran Matrix 4.34 (in) Discharge Instruction: Moisten w/normal saline or sterile water; Cover wound as directed. Do not remove from wound bed. Secondary Dressing (BORDER) Zetuvit Plus SILICONE BORDER Dressing 5x5 (in/in) Discharge Instruction: Please do not put silicone bordered dressings under wraps. Use non-bordered dressing only. Secured With Compression Wrap Compression Stockings Facilities manager) Signed: 02/12/2023 3:25:36 PM By: Yevonne Pax RN Entered By: Yevonne Pax on  02/11/2023 06:04:01 -------------------------------------------------------------------------------- Vitals Details Patient Name: Date of Service: [name], Deone 02/11/2023 8:45 A M Medical Record Number: [id number] Patient Account Number: [id number] Date of Birth/Sex: Treating RN: [birth year] (67 y.o. Melonie [place] Primary Care Lutricia Widjaja: Rolin Barry Other Clinician: Referring Jaryiah Mehlman: Treating Rejina Odle/Extender: Vanessa Ralphs in Treatment: 3 Vital Signs Time Taken: 08:56 Temperature (F): 98.5 Height (in): 59 Pulse (bpm): 72 Labrum, Smaran ([id number]) [id number] Page 9 of 9 Weight (lbs): 288 Respiratory Rate (breaths/min): 18 Body Mass Index (BMI): 58.2 Blood Pressure (mmHg): 142/80 Reference Range: 80 - 120 mg / dl Electronic Signature(s) Signed: 02/12/2023 3:25:36 PM By: Yevonne Pax RN Entered By: Yevonne Pax on 02/11/2023 05:57:16

## 2023-02-12 ENCOUNTER — Telehealth: Payer: Self-pay | Admitting: *Deleted

## 2023-02-12 MED ORDER — GABAPENTIN 300 MG PO CAPS: 600.0000 mg | ORAL_CAPSULE | Freq: Three times a day (TID) | ORAL | 3 refills | Status: DC

## 2023-02-12 NOTE — Telephone Encounter (Addendum)
Patient returned my phone call to discuss his gabapentin dosing. New dosing instructions for the gabapentin reviewed with patient. Pt read back the instructions.  Patient voiced concerns that he was not returning calls. He is screening calls the best he can. He stated that he is been called by the billing department multiple times in a week b/c of the financial bills mounting up. "I just don't want to keep dealing with health care bills anymore and I'm contemplating on stopping treatments. I wonder what my life expectancy would be if I stop treatment all together."  I explained to him that Dr. Donneta [name] would need to answer his questions about life expectancy. Pt insisted on having a direct conversation with [name] about life expectancy. I encouraged pt not to make an immediate decision on stopping treatments. I asked him if he has explored the other financial options that he and [name] discussed. He stated that he called the foundations that [name] mentioned but he doesn't get anywhere with the links and it's a waste of time. He expressed frustration that everywhere he turns, no one can assist him and he is informed that he is not eligible for funds.   On a different note, I asked him if he ever got back in touch with [name] office. I had given the patient the phone number to [name] office at the last visit. 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 Patient has not yet reached back to [name] office. I again provided him with the phone number to pain mgmt at [phone].  Pt thanked me for calling him and listening. I reassured him that I would send his concerns about stopping treatments to his clinical team.

## 2023-02-12 NOTE — Telephone Encounter (Signed)
Attempted to reach patient. Left vm to discuss his new gabapentin dose. V/o Elouise Munroe, NP. Pt needs to start taking gabapentin 600 mg three times daily. New script sent to [facility] [phone] [facility], [place]

## 2023-02-15 ENCOUNTER — Other Ambulatory Visit: Payer: Self-pay | Admitting: Hospice and Palliative Medicine

## 2023-02-15 MED ORDER — GABAPENTIN 300 MG PO CAPS: 600.0000 mg | ORAL_CAPSULE | Freq: Three times a day (TID) | ORAL | 3 refills | Status: DC

## 2023-02-18 ENCOUNTER — Telehealth: Payer: Self-pay | Admitting: *Deleted

## 2023-02-18 ENCOUNTER — Other Ambulatory Visit: Payer: Self-pay | Admitting: *Deleted

## 2023-02-18 DIAGNOSIS — C7951 Secondary malignant neoplasm of bone: Secondary | ICD-10-CM

## 2023-02-18 DIAGNOSIS — G546 Phantom limb syndrome with pain: Secondary | ICD-10-CM

## 2023-02-18 DIAGNOSIS — C61 Malignant neoplasm of prostate: Secondary | ICD-10-CM

## 2023-02-18 NOTE — Telephone Encounter (Addendum)
Oral Oncology Patient Advocate Encounter   Received notification re-enrollment for assistance for Nubeqa through [employer] [facility] has been approved. Patient may continue to receive their medication at $0 from this program.    [employer]'s phone number [phone].   Effective dates: 03/06/23 through 03/04/24.   Ardeen Fillers, CPhT Oncology Pharmacy Patient Advocate  [facility]  [phone] (phone) [phone] (fax) 02/18/2023 8:18 AM

## 2023-02-18 NOTE — Telephone Encounter (Signed)
Rn called patient to r/s pt's apt w/dr. Barbaraann Cao to 03/15/23. Pt accepted apt at 9am.

## 2023-02-22 ENCOUNTER — Ambulatory Visit: Payer: Medicare PPO | Admitting: Internal Medicine

## 2023-02-25 ENCOUNTER — Inpatient Hospital Stay: Payer: Medicare PPO

## 2023-02-25 ENCOUNTER — Encounter: Payer: Self-pay | Admitting: Internal Medicine

## 2023-02-25 ENCOUNTER — Other Ambulatory Visit: Payer: Medicare PPO

## 2023-02-25 ENCOUNTER — Ambulatory Visit: Payer: Medicare PPO | Admitting: Internal Medicine

## 2023-02-25 ENCOUNTER — Ambulatory Visit: Payer: Medicare PPO

## 2023-02-25 ENCOUNTER — Inpatient Hospital Stay (HOSPITAL_BASED_OUTPATIENT_CLINIC_OR_DEPARTMENT_OTHER): Payer: Medicare PPO | Admitting: Internal Medicine

## 2023-02-25 ENCOUNTER — Telehealth: Payer: Self-pay | Admitting: *Deleted

## 2023-02-25 VITALS — BP 127/75 | HR 71 | Temp 97.1°F

## 2023-02-25 DIAGNOSIS — C7951 Secondary malignant neoplasm of bone: Secondary | ICD-10-CM

## 2023-02-25 DIAGNOSIS — C61 Malignant neoplasm of prostate: Secondary | ICD-10-CM

## 2023-02-25 DIAGNOSIS — Z89611 Acquired absence of right leg above knee: Secondary | ICD-10-CM

## 2023-02-25 DIAGNOSIS — Z5111 Encounter for antineoplastic chemotherapy: Secondary | ICD-10-CM | POA: Diagnosis not present

## 2023-02-25 DIAGNOSIS — L8990 Pressure ulcer of unspecified site, unspecified stage: Secondary | ICD-10-CM

## 2023-02-25 LAB — CMP (CANCER CENTER ONLY)
ALT: 12 U/L (ref 0–44)
AST: 17 U/L (ref 15–41)
Albumin: 3.4 g/dL — ABNORMAL LOW (ref 3.5–5.0)
Alkaline Phosphatase: 79 U/L (ref 38–126)
Anion gap: 8 (ref 5–15)
BUN: 14 mg/dL (ref 8–23)
CO2: 21 mmol/L — ABNORMAL LOW (ref 22–32)
Calcium: 9 mg/dL (ref 8.9–10.3)
Chloride: 109 mmol/L (ref 98–111)
Creatinine: 0.75 mg/dL (ref 0.61–1.24)
GFR, Estimated: >60 mL/min (ref >60)
Glucose, Bld: 101 mg/dL — ABNORMAL HIGH (ref 70–99)
Potassium: 3.6 mmol/L (ref 3.5–5.1)
Sodium: 138 mmol/L (ref 135–145)
Total Bilirubin: 1.7 mg/dL — ABNORMAL HIGH (ref <1.2)
Total Protein: 6.1 g/dL — ABNORMAL LOW (ref 6.5–8.1)

## 2023-02-25 LAB — CBC WITH DIFFERENTIAL (CANCER CENTER ONLY)
Abs Immature Granulocytes: 0.01 10*3/uL (ref 0.00–0.07)
Basophils Absolute: 0.1 10*3/uL (ref 0.0–0.1)
Basophils Relative: 1 %
Eosinophils Absolute: 0.1 10*3/uL (ref 0.0–0.5)
Eosinophils Relative: 1 %
HCT: 27.7 % — ABNORMAL LOW (ref 39.0–52.0)
Hemoglobin: 8.9 g/dL — ABNORMAL LOW (ref 13.0–17.0)
Immature Granulocytes: 0 %
Lymphocytes Relative: 12 %
Lymphs Abs: 0.4 10*3/uL — ABNORMAL LOW (ref 0.7–4.0)
MCH: 32.8 pg (ref 26.0–34.0)
MCHC: 32.1 g/dL (ref 30.0–36.0)
MCV: 102.2 fL — ABNORMAL HIGH (ref 80.0–100.0)
Monocytes Absolute: 0.5 10*3/uL (ref 0.1–1.0)
Monocytes Relative: 14 %
Neutro Abs: 2.7 10*3/uL (ref 1.7–7.7)
Neutrophils Relative %: 72 %
Platelet Count: 193 10*3/uL (ref 150–400)
RBC: 2.71 MIL/uL — ABNORMAL LOW (ref 4.22–5.81)
RDW: 19.4 % — ABNORMAL HIGH (ref 11.5–15.5)
WBC Count: 3.7 10*3/uL — ABNORMAL LOW (ref 4.0–10.5)
nRBC: 0 % (ref 0.0–0.2)

## 2023-02-25 LAB — PSA: Prostatic Specific Antigen: 10.86 ng/mL — ABNORMAL HIGH (ref 0.00–4.00)

## 2023-02-25 MED ORDER — HEPARIN SOD (PORK) LOCK FLUSH 100 UNIT/ML IV SOLN
500.0000 [IU] | Freq: Once | INTRAVENOUS | Status: AC
  Administered 2023-02-25: 500 [IU] via INTRAVENOUS
  Filled 2023-02-25: qty 5

## 2023-02-25 NOTE — Telephone Encounter (Signed)
Order placed in epic for hospital bed; Community message sent to Mickeal Needy at [employer]. I also left message on his VM.

## 2023-02-25 NOTE — Progress Notes (Signed)
[facility] OFFICE PROGRESS NOTE  Patient Care Team: Zada Finders Joycie Peek, MD as PCP - General (Family Medicine) Earna Coder, MD as Consulting Physician (Oncology) Carmina Miller, MD as Consulting Physician (Radiation Oncology)   Cancer Staging  Prostate cancer metastatic to bone [facility]) Staging form: Prostate, AJCC 8th Edition - Clinical: Stage IVB (cTX, cNX, pM1b, PSA: 703) - Signed by Earna Coder, MD on 10/04/2022 Prostate specific antigen (PSA) range: 20 or greater    Oncology History Overview Note  #HIGH RISK/ DE-NOVO CASTRATE SENSITIVE PROSTATE CANCER: CT CAP [JULY 2024]-multiple bone metastases; MRI lumbar spine without contrast  [PCP]-L4-L5 lesion.  On the thecal sac.  PSA 700.   Check guardant testing. NO Biopsy. AUG 9th, 2024- PSMA PET - Multifocal intense radiotracer activity within the prostate gland consistent with primary prostate adenocarcinoma; Evidence of nodal metastasis with radiotracer avid tiny lymph nodes in the rectal sheath and periaortic retroperitoneum; Extensive radiotracer avid skeletal metastasis with near confluent lesions within the pelvis. Lesions in the lumbar spine, RIGHT scapula, LEFT humerus. Lesions too numerous to count. No evidence of visceral metastasis or pulmonary metastasis; awaiting guardant testing. On ADT- [started aug 1st, 2024].   # AUG 1st, 2024- Firmagon/Eligard + Rogue Bussing' s/p Taxoetere X4 cycles [stopped December 2023-secondary to worsening phantom pain/neuropathy]   --------------------- # CAD; on coumadin; Bil AKA    Prostate cancer metastatic to bone (HCC)  09/30/2022 Initial Diagnosis   Prostate cancer metastatic to bone (HCC)   10/04/2022 Cancer Staging   Staging form: Prostate, AJCC 8th Edition - Clinical: Stage IVB (cTX, cNX, pM1b, PSA: 703) - Signed by Earna Coder, MD on 10/04/2022 Prostate specific antigen (PSA) range: 20 or greater   11/29/2022 -  Chemotherapy   Patient is on Treatment  Plan : PROSTATE Docetaxel (75) q21d      Genetic Testing   Negative genetic testing. No pathogenic variants identified on the [facility] Common Hereditary Cancers+RNA panel. The report date is 01/09/2023.  The Common Hereditary Cancers Panel + RNA offered by [facility] includes sequencing and/or deletion duplication testing of the following 48 genes: APC*, ATM*, AXIN2, BAP1, BARD1, BMPR1A, BRCA1, BRCA2, BRIP1, CDH1, CDK4, CDKN2A (p14ARF), CDKN2A (p16INK4a), CHEK2, CTNNA1, DICER1*, EPCAM*, FH*, GREM1*, HOXB13, KIT, MBD4, MEN1*, MLH1*, MSH2*, MSH3*, MSH6*, MUTYH, NF1*, NTHL1, PALB2, PDGFRA, PMS2*, POLD1*, POLE, PTEN*, RAD51C, RAD51D, SDHA*, SDHB, SDHC*, SDHD, SMAD4, SMARCA4, STK11, TP53, TSC1*, TSC2, VHL.       HISTORY OF PRESENT ILLNESS: Patient ambulating-with wheel chair/AKA.  Patient is accompanied by friend.  [name] 66 y.o.  male pleasant patient with a medical history significant of s/p of bilateral AKA, PE on Coumadin, HTN, depression with  denovo-castrate sensitive metastatic prostate cancer currently on ADT+Darolutamide+Taxoetere chemotherapy is here for a follow up.  Patient currently s/p 4 cycles of Taxotere chemotherapy.   Patient complains of having trouble sleeping, would like rx for hospital bed. No appetite, drinks 1/day.    Patient noticed significant worsening of his Phantom pain 10/10. Notes to have worsening left shoulder /arm pain- only on prn oxycodone [rare]; prn Advil/Naproxen.    "Stumps" are scaling". Has bed sores on buttocks-currently managed by wound care.  Review of Systems  Constitutional:  Positive for malaise/fatigue. Negative for chills, diaphoresis, fever and weight loss.  HENT:  Negative for nosebleeds and sore throat.   Eyes:  Negative for double vision.  Respiratory:  Negative for cough, hemoptysis, sputum production, shortness of breath and wheezing.   Cardiovascular:  Positive for leg swelling. Negative  for chest pain, palpitations and orthopnea.   Gastrointestinal:  Negative for abdominal pain, blood in stool, constipation, diarrhea, heartburn, melena, nausea and vomiting.  Genitourinary:  Negative for dysuria, frequency and urgency.  Musculoskeletal:  Positive for back pain, joint pain and myalgias.  Skin: Negative.  Negative for itching and rash.  Neurological:  Negative for dizziness, tingling, focal weakness, weakness and headaches.  Endo/Heme/Allergies:  Does not bruise/bleed easily.  Psychiatric/Behavioral:  Negative for depression. The patient is not nervous/anxious and does not have insomnia.       PAST MEDICAL HISTORY :  Past Medical History:  Diagnosis Date   Depression    History of blood clots 08/13/2011   History of left knee replacement    HTN (hypertension)    Lymphedema    lt leg-per pt   PE (pulmonary thromboembolism) (HCC)    S/P AKA (above knee amputation) bilateral (HCC)    rt 11/24/07 and lt 01/11/17    PAST SURGICAL HISTORY :   Past Surgical History:  Procedure Laterality Date   ACHILLES TENDON SURGERY Left    per pt   bone clip removal Left 10/04/2015   knee   CARDIAC SURGERY  11/2012   cath.-per pt   CERVICAL FUSION  07/28/2015   per pt   EPIGASTRIC HERNIA REPAIR     per pt   hematoma removal Left 11/02/2015   knee   HIP SURGERY Right    x8 per pt   IR IMAGING GUIDED PORT INSERTION  11/14/2022   KNEE FUSION Right    KNEE JOINT MANIPULATION Left 09/11/2011   KNEE SURGERY Left    x3   leg stump Left 03/2017   leg surgery-per pt   NECK SURGERY  02/2015   plates-per pt   REPLACEMENT TOTAL KNEE Right    REPLACEMENT TOTAL KNEE Left 07/02/2011   again in 07/10/2011 per pt   s/p of bilateral AKA Right    screen filter removal  10/20/2008   for embolism per pt   screen filter replacement     x3   SHOULDER SURGERY Right    x2-per pt   SPINE SURGERY  02/11/2015   vertebra spacer-per pt   toe nail removal Right    per pt   VARICOSE VEIN SURGERY     x5-per pt    FAMILY HISTORY :    Family History  Problem Relation Age of Onset   Alzheimer's disease Mother    Lung cancer Father     SOCIAL HISTORY:   Social History   Tobacco Use   Smoking status: Never   Smokeless tobacco: Former  Building services engineer status: Never Used  Substance Use Topics   Alcohol use: Yes    Alcohol/week: 3.0 standard drinks of alcohol    Types: 3 Glasses of wine per week   Drug use: Never    ALLERGIES:  is allergic to bacitracin, vancomycin, ketorolac, and spironolactone.  MEDICATIONS:  Current Outpatient Medications  Medication Sig Dispense Refill   apixaban (ELIQUIS) 5 MG TABS tablet Take 1 tablet (5 mg total) by mouth 2 (two) times daily. 60 tablet 3   buPROPion (WELLBUTRIN XL) 300 MG 24 hr tablet Take 300 mg by mouth daily.     darolutamide (NUBEQA) 300 MG tablet Take 600 mg by mouth 2 (two) times daily with a meal.     gabapentin (NEURONTIN) 300 MG capsule Take 2 capsules (600 mg total) by mouth 3 (three) times daily. 180 capsule 3  lidocaine-prilocaine (EMLA) cream Apply 1 Application topically as needed. 30 g 0   ondansetron (ZOFRAN) 8 MG tablet Take 1 tablet (8 mg total) by mouth every 8 (eight) hours as needed for nausea or vomiting. 60 tablet 1   oxyCODONE-acetaminophen (PERCOCET/ROXICET) 5-325 MG tablet Take 1 tablet by mouth every 12 (twelve) hours as needed for moderate pain (pain score 4-6). 60 tablet 0   senna-docusate (SENOKOT-S) 8.6-50 MG tablet Take 2 tablets by mouth 2 (two) times daily as needed for mild constipation. 100 tablet 0   lisinopril (ZESTRIL) 20 MG tablet Take 20 mg by mouth 2 (two) times daily.     No current facility-administered medications for this visit.    PHYSICAL EXAMINATION:   BP 127/75 (BP Location: Right Arm, Patient Position: Sitting, Cuff Size: Large)   Pulse 71   Temp (!) 97.1 F (36.2 C)   SpO2 100%   There were no vitals filed for this visit.     Patient has bilateral AKA.   Physical Exam Vitals and nursing note  reviewed.  HENT:     Head: Normocephalic and atraumatic.     Mouth/Throat:     Pharynx: Oropharynx is clear.  Eyes:     Extraocular Movements: Extraocular movements intact.     Pupils: Pupils are equal, round, and reactive to light.  Cardiovascular:     Rate and Rhythm: Normal rate and regular rhythm.  Pulmonary:     Comments: Decreased breath sounds bilaterally.  Abdominal:     Palpations: Abdomen is soft.  Musculoskeletal:        General: Normal range of motion.     Cervical back: Normal range of motion.  Skin:    General: Skin is warm.  Neurological:     General: No focal deficit present.     Mental Status: He is alert and oriented to person, place, and time.  Psychiatric:        Behavior: Behavior normal.        Judgment: Judgment normal.      LABORATORY DATA:  I have reviewed the data as listed    Component Value Date/Time   NA 138 02/25/2023 0820   K 3.6 02/25/2023 0820   CL 109 02/25/2023 0820   CO2 21 (L) 02/25/2023 0820   GLUCOSE 101 (H) 02/25/2023 0820   BUN 14 02/25/2023 0820   CREATININE 0.75 02/25/2023 0820   CALCIUM 9.0 02/25/2023 0820   PROT 6.1 (L) 02/25/2023 0820   ALBUMIN 3.4 (L) 02/25/2023 0820   AST 17 02/25/2023 0820   ALT 12 02/25/2023 0820   ALKPHOS 79 02/25/2023 0820   BILITOT 1.7 (H) 02/25/2023 0820   GFRNONAA >60 02/25/2023 0820   GFRAA >60 10/19/2016 1335    No results found for: "SPEP", "UPEP"  Lab Results  Component Value Date   WBC 3.7 (L) 02/25/2023   NEUTROABS 2.7 02/25/2023   HGB 8.9 (L) 02/25/2023   HCT 27.7 (L) 02/25/2023   MCV 102.2 (H) 02/25/2023   PLT 193 02/25/2023      Chemistry      Component Value Date/Time   NA 138 02/25/2023 0820   K 3.6 02/25/2023 0820   CL 109 02/25/2023 0820   CO2 21 (L) 02/25/2023 0820   BUN 14 02/25/2023 0820   CREATININE 0.75 02/25/2023 0820      Component Value Date/Time   CALCIUM 9.0 02/25/2023 0820   ALKPHOS 79 02/25/2023 0820   AST 17 02/25/2023 0820   ALT 12 02/25/2023  0820   BILITOT 1.7 (H) 02/25/2023 0820       RADIOGRAPHIC STUDIES: I have personally reviewed the radiological images as listed and agreed with the findings in the report. No results found.   ASSESSMENT & PLAN:  Prostate cancer metastatic to bone (HCC) #HIGH RISK/ DE-NOVO CASTRATE SENSITIVE PROSTATE CANCER: CT CAP [JULY 2024]-multiple bone metastases; MRI lumbar spine without contrast  [PCP]-L4-L5 lesion.  On the thecal sac. Pre treatment : PSA 700. AUG 9th, 2024- PSMA PET - Multifocal intense radiotracer activity within the prostate gland consistent with primary prostate adenocarcinoma; Evidence of nodal metastasis with radiotracer avid tiny lymph nodes in the rectal sheath and periaortic retroperitoneum; Extensive radiotracer avid skeletal metastasis with near confluent lesions within the pelvis. Lesions in the lumbar spine, RIGHT scapula, LEFT humerus. Lesions too numerous to count. No evidence of visceral metastasis or pulmonary metastasis; AUG 2024-guardant testing- ATM [eligible for PARPi]. On ADT- [started aug 1st, 2024]. ADT + Taxotere+ Darolutamide   # currently s/p cycle #4  of Taxotere [at 60 mg/m2 dose;With Growth factor].  Labs-CBC/chemistries were reviewed with the patient slightly elevated Bili sec to [employer] syndrome.   # I would recommend hold further chemotherapy given patient's poor tolerance-worsening phantom pain/ ?  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   # left shoulder pain/ Neck pain-/Back pain/Phantom pain-  medication: Patient currently on oxycodone 1 BID; gabapentin- refilled prescription.  S/p RT-  Dr. Pablo Ledger RT10/03/2022. MRI Neck-metastatic disease in vertebral bodies; no epidural disease. Recommend- evaluation with Lateef-  awaiting appt with Dr.vaslow.  Check x-rays of the shoulder and also humerus.  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  # Anxiety/depression: refer to [facility]-   # IV access: s/p mediport- stable.   # Decub ulcer-  s/p wound care evaluation/ home health evaluation- stable.   Eligard q6 M [02/03/23]  # DISPOSITION: # x- rays ordered # [facility]  # hospital bed script re: disability/  #  HOLD Taxotere today; cancel tomorrow- injection; de-access. # follow up in 6  weeks- MD; port flush/ labs- cbc/cmp;PSA-  Dr.B     Orders Placed This Encounter  Procedures   DG Humerus Left    Standing Status:   Future    Expiration Date:   02/25/2024    Reason for Exam (SYMPTOM  OR DIAGNOSIS REQUIRED):   left arm pain    Preferred imaging location?:   [facility]   DG Shoulder Left    Standing Status:   Future    Expiration Date:   02/25/2024    Reason for Exam (SYMPTOM  OR DIAGNOSIS REQUIRED):   left shouder pain    Preferred imaging location?:   [facility]   CBC with Differential (Cancer Center Only)    Standing Status:   Future    Expected Date:   04/08/2023    Expiration Date:   02/25/2024   CMP (Cancer Center only)    Standing Status:   Future    Expected Date:   04/08/2023    Expiration Date:   02/25/2024   PSA    Standing Status:   Future    Expected Date:   04/08/2023    Expiration Date:   02/25/2024   Ambulatory Referral to [facility]    Referral Priority:   Routine    Referral Type:   Consultation  Referral Reason:   Specialty Services Required    Requested Specialty:   Oncology    Number of Visits Requested:   1   All questions were answered. The patient knows to call the clinic with any problems, questions or concerns.      Earna Coder, MD 02/25/2023 11:02 AM

## 2023-02-25 NOTE — Assessment & Plan Note (Addendum)
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  On the thecal sac. Pre treatment : PSA 700. AUG 9th, 2024- PSMA PET - Multifocal intense radiotracer activity within the prostate gland consistent with primary prostate adenocarcinoma; Evidence of nodal metastasis with radiotracer avid tiny lymph nodes in the rectal sheath and periaortic retroperitoneum; Extensive radiotracer avid skeletal metastasis with near confluent lesions within the pelvis. Lesions in the lumbar spine, RIGHT scapula, LEFT humerus. Lesions too numerous to count. No evidence of visceral metastasis or pulmonary metastasis; AUG 2024-guardant testing- ATM [eligible for PARPi]. On ADT- [started aug 1st, 2024]. ADT + Taxotere+ Darolutamide   # currently s/p cycle #4  of Taxotere [at 60 mg/m2 dose;With Growth factor].  Labs-CBC/chemistries were reviewed with the patient slightly elevated Bili sec to [employer] syndrome.   # I would recommend hold further chemotherapy given patient's poor tolerance-worsening phantom pain/ ?  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   # left shoulder pain/ Neck pain-/Back pain/Phantom pain-  medication: Patient currently on oxycodone 1 BID; gabapentin- refilled prescription.  S/p RT-  Dr. Pablo Ledger RT10/03/2022. MRI Neck-metastatic disease in vertebral bodies; no epidural disease. Recommend- evaluation with Lateef-  awaiting appt with Dr.vaslow.  Check x-rays of the shoulder and also humerus.  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  # Anxiety/depression: refer to [facility]-   # IV access: s/p mediport- stable.   # Decub ulcer-  s/p wound care evaluation/ home health evaluation- stable.   Eligard q6 M [02/03/23]  # DISPOSITION: # x- rays  ordered # [facility]  # hospital bed script re: disability/  #  HOLD Taxotere today; cancel tomorrow- injection; de-access. # follow up in 6  weeks- MD; port flush/ labs- cbc/cmp;PSA-  Dr.B

## 2023-02-25 NOTE — Progress Notes (Signed)
C/o having trouble sleeping, would like rx for hospital bed. Was taking Melatonin, ran out of pills, will get more. Only helps him sleep for 2 overs.  No appetite, drinks 1/day. Having trouble swallowing solids.  Phantom pain 10/10.  "Stumps" are scaling". Has bed sores on buttocks.

## 2023-02-26 ENCOUNTER — Inpatient Hospital Stay: Payer: Medicare PPO

## 2023-03-01 ENCOUNTER — Encounter: Payer: Self-pay | Admitting: *Deleted

## 2023-03-01 ENCOUNTER — Telehealth: Payer: Self-pay | Admitting: *Deleted

## 2023-03-01 NOTE — Telephone Encounter (Signed)
Pt contacted josh, np today to determine an update on his dme hp bed. I reviewed chart- michelle documented that she left vm w/Mitch at [facility] and placed order in epic. I did not see an order in parachute. RN Called [facility] at [phone] to confirm that hospital bed order was received. I was on hold for 25 mins and [facility] did not answer due to high volume.  Given I could not reach a life personnel at [facility] to confirm receipt of HP bed order, I submitted the order via parachute. Will wait for [facility] to process order. I attempted to reach patient. No answer left vm

## 2023-03-04 ENCOUNTER — Ambulatory Visit: Payer: Medicare PPO | Admitting: Internal Medicine

## 2023-03-06 ENCOUNTER — Other Ambulatory Visit: Payer: Self-pay

## 2023-03-06 ENCOUNTER — Emergency Department
Admission: EM | Admit: 2023-03-06 | Discharge: 2023-03-06 | Disposition: A | Discharge status: Home / Self Care | Payer: Medicare PPO | Attending: Emergency Medicine | Admitting: Emergency Medicine

## 2023-03-06 ENCOUNTER — Emergency Department: Payer: Medicare PPO

## 2023-03-06 DIAGNOSIS — R600 Localized edema: Secondary | ICD-10-CM | POA: Insufficient documentation

## 2023-03-06 DIAGNOSIS — Z7901 Long term (current) use of anticoagulants: Secondary | ICD-10-CM | POA: Insufficient documentation

## 2023-03-06 DIAGNOSIS — I10 Essential (primary) hypertension: Secondary | ICD-10-CM | POA: Diagnosis not present

## 2023-03-06 DIAGNOSIS — Z8546 Personal history of malignant neoplasm of prostate: Secondary | ICD-10-CM | POA: Diagnosis not present

## 2023-03-06 DIAGNOSIS — M7989 Other specified soft tissue disorders: Secondary | ICD-10-CM | POA: Diagnosis present

## 2023-03-06 LAB — BASIC METABOLIC PANEL
Anion gap: 11 (ref 5–15)
BUN: 16 mg/dL (ref 8–23)
CO2: 22 mmol/L (ref 22–32)
Calcium: 8.7 mg/dL — ABNORMAL LOW (ref 8.9–10.3)
Chloride: 106 mmol/L (ref 98–111)
Creatinine, Ser: 0.61 mg/dL (ref 0.61–1.24)
GFR, Estimated: >60 mL/min (ref >60)
Glucose, Bld: 96 mg/dL (ref 70–99)
Potassium: 3.8 mmol/L (ref 3.5–5.1)
Sodium: 139 mmol/L (ref 135–145)

## 2023-03-06 LAB — CBC WITH DIFFERENTIAL/PLATELET
Abs Immature Granulocytes: 0.01 10*3/uL (ref 0.00–0.07)
Basophils Absolute: 0.1 10*3/uL (ref 0.0–0.1)
Basophils Relative: 1 %
Eosinophils Absolute: 0.2 10*3/uL (ref 0.0–0.5)
Eosinophils Relative: 4 %
HCT: 29.6 % — ABNORMAL LOW (ref 39.0–52.0)
Hemoglobin: 9.6 g/dL — ABNORMAL LOW (ref 13.0–17.0)
Immature Granulocytes: 0 %
Lymphocytes Relative: 12 %
Lymphs Abs: 0.5 10*3/uL — ABNORMAL LOW (ref 0.7–4.0)
MCH: 34 pg (ref 26.0–34.0)
MCHC: 32.4 g/dL (ref 30.0–36.0)
MCV: 105 fL — ABNORMAL HIGH (ref 80.0–100.0)
Monocytes Absolute: 0.5 10*3/uL (ref 0.1–1.0)
Monocytes Relative: 12 %
Neutro Abs: 2.7 10*3/uL (ref 1.7–7.7)
Neutrophils Relative %: 71 %
Platelets: 168 10*3/uL (ref 150–400)
RBC: 2.82 MIL/uL — ABNORMAL LOW (ref 4.22–5.81)
RDW: 16.4 % — ABNORMAL HIGH (ref 11.5–15.5)
WBC: 3.9 10*3/uL — ABNORMAL LOW (ref 4.0–10.5)
nRBC: 0 % (ref 0.0–0.2)

## 2023-03-06 LAB — TROPONIN I (HIGH SENSITIVITY)
Troponin I (High Sensitivity): 7 ng/L (ref <18)
Troponin I (High Sensitivity): 8 ng/L (ref <18)

## 2023-03-06 LAB — BRAIN NATRIURETIC PEPTIDE: B Natriuretic Peptide: 43.4 pg/mL (ref 0.0–100.0)

## 2023-03-06 MED ORDER — IOHEXOL 350 MG/ML SOLN
100.0000 mL | Freq: Once | INTRAVENOUS | Status: AC | PRN
  Administered 2023-03-06: 100 mL via INTRAVENOUS

## 2023-03-06 NOTE — ED Notes (Signed)
 This RN, Waddell EDT, and Delsa EDT cleaned patient up after 2 bowel movements. Patient was able to use the bedpan during the first occurrence. Patient was cleaned up, placed on two new paper chucks, barrier cream was applied, and a sacrum foam dressing was applied. Patient was placed in a comfortable position and the bed was lowered. Call bell within reach.

## 2023-03-06 NOTE — ED Provider Notes (Signed)
 [facility] Provider Note    Event Date/Time   First MD Initiated Contact with Patient 03/06/23 1930     (approximate)   History   Chief Complaint Leg Swelling   HPI  [name] is a 68 y.o. male with past medical history of hypertension, metastatic prostate cancer, pulmonary embolism on Eliquis , and bilateral AKA who presents to the ED complaining of peripheral edema.  Patient reports that he has been dealing with increasing swelling to both of his lower extremities as well as his arms and abdomen for the past couple of days.  He reports some difficulty breathing with exertion, denies any difficulty breathing at rest.  He has not had any fevers, cough, or chest pain.  He denies any history of CHF.     Physical Exam   Triage Vital Signs: ED Triage Vitals [03/06/23 1902]  Encounter Vitals Group     BP 93/63     Systolic BP Percentile      Diastolic BP Percentile      Pulse Rate 79     Resp 18     Temp (!) 97.3 F (36.3 C)     Temp src      SpO2 100 %     Weight      Height      Head Circumference      Peak Flow      Pain Score      Pain Loc      Pain Education      Exclude from Growth Chart     Most recent vital signs: Vitals:   03/06/23 2050 03/06/23 2233  BP: (!) 95/53 94/64  Pulse: 80 80  Resp: 20 (!) 23  Temp:    SpO2: 97% 95%    Constitutional: Alert and oriented. Eyes: Conjunctivae are normal. Head: Atraumatic. Nose: No congestion/rhinnorhea. Mouth/Throat: Mucous membranes are moist.  Cardiovascular: Normal rate, regular rhythm. Grossly normal heart sounds.  2+ radial pulses bilaterally. Respiratory: Normal respiratory effort.  No retractions. Lungs CTAB. Gastrointestinal: Soft and nontender. No distention. Musculoskeletal: Status post bilateral AKA with pitting edema to bilateral thighs and bilateral upper extremities. Neurologic:  Normal speech and language. No gross focal neurologic deficits are appreciated.    ED  Results / Procedures / Treatments   Labs (all labs ordered are listed, but only abnormal results are displayed) Labs Reviewed  CBC WITH DIFFERENTIAL/PLATELET - Abnormal; Notable for the following components:      Result Value   WBC 3.9 (*)    RBC 2.82 (*)    Hemoglobin 9.6 (*)    HCT 29.6 (*)    MCV 105.0 (*)    RDW 16.4 (*)    Lymphs Abs 0.5 (*)    All other components within normal limits  BASIC METABOLIC PANEL - Abnormal; Notable for the following components:   Calcium 8.7 (*)    All other components within normal limits  BRAIN NATRIURETIC PEPTIDE  TROPONIN I (HIGH SENSITIVITY)  TROPONIN I (HIGH SENSITIVITY)     EKG  ED ECG REPORT I, Carlin Palin, the attending physician, personally viewed and interpreted this ECG.   Date: 03/06/2023  EKG Time: 19:06  Rate: 78  Rhythm: normal sinus rhythm  Axis: Normal  Intervals:first-degree A-V block   ST&T Change: None  RADIOLOGY Chest x-ray reviewed and interpreted by me with no infiltrate, edema, or effusion.  PROCEDURES:  Critical Care performed: No  Procedures   MEDICATIONS ORDERED IN ED: Medications  iohexol  (  OMNIPAQUE ) 350 MG/ML injection 100 mL (100 mLs Intravenous Contrast Given 03/06/23 2102)     IMPRESSION / MDM / ASSESSMENT AND PLAN / ED COURSE  I reviewed the triage vital signs and the nursing notes.                              68 y.o. male with past medical history of hypertension, metastatic prostate cancer, pulmonary embolism on Eliquis , and bilateral AKA who presents to the ED complaining of increasing peripheral edema for the past 2 days with some shortness of breath.  Patient's presentation is most consistent with acute presentation with potential threat to life or bodily function.  Differential diagnosis includes, but is not limited to, CHF, PE, pneumonia, anemia, electrolyte abnormality, AKI, peripheral edema.  Patient nontoxic-appearing and in no acute distress, vital signs are unremarkable.   Labs without significant anemia, leukocytosis, electrolyte abnormality, or AKI.  2 sets of troponin are within normal limits and BNP within normal limits, no findings concerning for CHF.  Chest x-ray unremarkable, CTA of chest is negative for PE or other acute finding, he does have ascending aortic aneurysm that will require outpatient follow-up.  He is breathing comfortably on room air here in the ED and appropriate for discharge home with outpatient follow-up, peripheral edema may be due to known malignancy.  He was counseled to return to the ED for new or worsening symptoms, patient agrees with plan.      FINAL CLINICAL IMPRESSION(S) / ED DIAGNOSES   Final diagnoses:  Peripheral edema     Rx / DC Orders   ED Discharge Orders     None        Note:  This document was prepared using Dragon voice recognition software and may include unintentional dictation errors.   Willo Dunnings, MD 03/06/23 2329

## 2023-03-06 NOTE — ED Triage Notes (Signed)
 Pt BIB EMS for BLE. Pt is bil AKA.  States that he is somewhat SOB with exertion. Symptoms started a couple of days ago, per pt

## 2023-03-07 ENCOUNTER — Telehealth: Payer: Self-pay

## 2023-03-07 ENCOUNTER — Observation Stay
Admission: EM | Admit: 2023-03-07 | Discharge: 2023-03-11 | Disposition: A | Discharge status: Home / Self Care | Payer: Medicare PPO | Attending: Internal Medicine | Admitting: Internal Medicine

## 2023-03-07 ENCOUNTER — Encounter: Payer: Self-pay | Admitting: Emergency Medicine

## 2023-03-07 ENCOUNTER — Emergency Department: Payer: Medicare PPO

## 2023-03-07 DIAGNOSIS — Z87891 Personal history of nicotine dependence: Secondary | ICD-10-CM | POA: Diagnosis not present

## 2023-03-07 DIAGNOSIS — I82402 Acute embolism and thrombosis of unspecified deep veins of left lower extremity: Secondary | ICD-10-CM | POA: Diagnosis present

## 2023-03-07 DIAGNOSIS — R197 Diarrhea, unspecified: Secondary | ICD-10-CM | POA: Diagnosis not present

## 2023-03-07 DIAGNOSIS — F1090 Alcohol use, unspecified, uncomplicated: Secondary | ICD-10-CM | POA: Diagnosis not present

## 2023-03-07 DIAGNOSIS — C7951 Secondary malignant neoplasm of bone: Secondary | ICD-10-CM | POA: Insufficient documentation

## 2023-03-07 DIAGNOSIS — L03115 Cellulitis of right lower limb: Secondary | ICD-10-CM | POA: Diagnosis present

## 2023-03-07 DIAGNOSIS — R0602 Shortness of breath: Secondary | ICD-10-CM | POA: Diagnosis not present

## 2023-03-07 DIAGNOSIS — G629 Polyneuropathy, unspecified: Secondary | ICD-10-CM | POA: Diagnosis not present

## 2023-03-07 DIAGNOSIS — Z79899 Other long term (current) drug therapy: Secondary | ICD-10-CM | POA: Insufficient documentation

## 2023-03-07 DIAGNOSIS — F32A Depression, unspecified: Secondary | ICD-10-CM | POA: Diagnosis present

## 2023-03-07 DIAGNOSIS — E876 Hypokalemia: Secondary | ICD-10-CM | POA: Diagnosis not present

## 2023-03-07 DIAGNOSIS — M79605 Pain in left leg: Secondary | ICD-10-CM | POA: Diagnosis present

## 2023-03-07 DIAGNOSIS — C61 Malignant neoplasm of prostate: Secondary | ICD-10-CM | POA: Diagnosis present

## 2023-03-07 DIAGNOSIS — I824Y2 Acute embolism and thrombosis of unspecified deep veins of left proximal lower extremity: Secondary | ICD-10-CM | POA: Diagnosis not present

## 2023-03-07 DIAGNOSIS — I10 Essential (primary) hypertension: Secondary | ICD-10-CM | POA: Diagnosis not present

## 2023-03-07 LAB — BASIC METABOLIC PANEL
Anion gap: 13 (ref 5–15)
BUN: 16 mg/dL (ref 8–23)
CO2: 19 mmol/L — ABNORMAL LOW (ref 22–32)
Calcium: 9.1 mg/dL (ref 8.9–10.3)
Chloride: 103 mmol/L (ref 98–111)
Creatinine, Ser: 0.59 mg/dL — ABNORMAL LOW (ref 0.61–1.24)
GFR, Estimated: >60 mL/min (ref >60)
Glucose, Bld: 96 mg/dL (ref 70–99)
Potassium: 3.4 mmol/L — ABNORMAL LOW (ref 3.5–5.1)
Sodium: 135 mmol/L (ref 135–145)

## 2023-03-07 LAB — CBC WITH DIFFERENTIAL/PLATELET
Abs Immature Granulocytes: 0.01 10*3/uL (ref 0.00–0.07)
Basophils Absolute: 0 10*3/uL (ref 0.0–0.1)
Basophils Relative: 1 %
Eosinophils Absolute: 0.1 10*3/uL (ref 0.0–0.5)
Eosinophils Relative: 1 %
HCT: 28.1 % — ABNORMAL LOW (ref 39.0–52.0)
Hemoglobin: 9.5 g/dL — ABNORMAL LOW (ref 13.0–17.0)
Immature Granulocytes: 0 %
Lymphocytes Relative: 6 %
Lymphs Abs: 0.3 10*3/uL — ABNORMAL LOW (ref 0.7–4.0)
MCH: 33.9 pg (ref 26.0–34.0)
MCHC: 33.8 g/dL (ref 30.0–36.0)
MCV: 100.4 fL — ABNORMAL HIGH (ref 80.0–100.0)
Monocytes Absolute: 0.4 10*3/uL (ref 0.1–1.0)
Monocytes Relative: 10 %
Neutro Abs: 3.6 10*3/uL (ref 1.7–7.7)
Neutrophils Relative %: 82 %
Platelets: 155 10*3/uL (ref 150–400)
RBC: 2.8 MIL/uL — ABNORMAL LOW (ref 4.22–5.81)
RDW: 16 % — ABNORMAL HIGH (ref 11.5–15.5)
WBC: 4.4 10*3/uL (ref 4.0–10.5)
nRBC: 0 % (ref 0.0–0.2)

## 2023-03-07 LAB — HEPATIC FUNCTION PANEL
ALT: 15 U/L (ref 0–44)
AST: 43 U/L — ABNORMAL HIGH (ref 15–41)
Albumin: 3.5 g/dL (ref 3.5–5.0)
Alkaline Phosphatase: 98 U/L (ref 38–126)
Bilirubin, Direct: 0.3 mg/dL — ABNORMAL HIGH (ref 0.0–0.2)
Indirect Bilirubin: 1.9 mg/dL — ABNORMAL HIGH (ref 0.3–0.9)
Total Bilirubin: 2.2 mg/dL — ABNORMAL HIGH (ref 0.0–1.2)
Total Protein: 6.2 g/dL — ABNORMAL LOW (ref 6.5–8.1)

## 2023-03-07 LAB — BRAIN NATRIURETIC PEPTIDE: B Natriuretic Peptide: 88.1 pg/mL (ref 0.0–100.0)

## 2023-03-07 MED ORDER — POTASSIUM CHLORIDE 20 MEQ PO PACK
40.0000 meq | PACK | Freq: Once | ORAL | Status: AC
  Administered 2023-03-07: 40 meq via ORAL
  Filled 2023-03-07: qty 2

## 2023-03-07 MED ORDER — ACETAMINOPHEN 325 MG PO TABS
650.0000 mg | ORAL_TABLET | Freq: Four times a day (QID) | ORAL | Status: DC | PRN
Start: 2023-03-07 — End: 2023-03-11

## 2023-03-07 MED ORDER — OXYCODONE-ACETAMINOPHEN 5-325 MG PO TABS
1.0000 | ORAL_TABLET | Freq: Two times a day (BID) | ORAL | Status: DC | PRN
  Administered 2023-03-08: 1 via ORAL
  Filled 2023-03-07: qty 1

## 2023-03-07 MED ORDER — DAROLUTAMIDE 300 MG PO TABS: 600.0000 mg | ORAL_TABLET | Freq: Two times a day (BID) | ORAL | Status: DC

## 2023-03-07 MED ORDER — LISINOPRIL 10 MG PO TABS: 20.0000 mg | ORAL_TABLET | Freq: Two times a day (BID) | ORAL | Status: DC

## 2023-03-07 MED ORDER — BUPROPION HCL ER (XL) 300 MG PO TB24
300.0000 mg | ORAL_TABLET | Freq: Every day | ORAL | Status: DC
  Administered 2023-03-08 – 2023-03-11 (×4): 300 mg via ORAL
  Filled 2023-03-07 (×3): qty 1
  Filled 2023-03-07: qty 2

## 2023-03-07 MED ORDER — ACETAMINOPHEN 650 MG RE SUPP: 650.0000 mg | Freq: Four times a day (QID) | RECTAL | Status: DC | PRN

## 2023-03-07 MED ORDER — TRAZODONE HCL 50 MG PO TABS: 25.0000 mg | ORAL_TABLET | Freq: Every evening | ORAL | Status: DC | PRN

## 2023-03-07 MED ORDER — SENNOSIDES-DOCUSATE SODIUM 8.6-50 MG PO TABS: 2.0000 | ORAL_TABLET | Freq: Two times a day (BID) | ORAL | Status: DC | PRN

## 2023-03-07 MED ORDER — GABAPENTIN 300 MG PO CAPS
600.0000 mg | ORAL_CAPSULE | Freq: Three times a day (TID) | ORAL | Status: DC
  Administered 2023-03-07 – 2023-03-11 (×11): 600 mg via ORAL
  Filled 2023-03-07 (×11): qty 2

## 2023-03-07 MED ORDER — ONDANSETRON 4 MG PO TBDP
4.0000 mg | ORAL_TABLET | Freq: Once | ORAL | Status: AC
  Administered 2023-03-07: 4 mg via ORAL
  Filled 2023-03-07: qty 1

## 2023-03-07 MED ORDER — ONDANSETRON HCL 4 MG/2ML IJ SOLN: 4.0000 mg | Freq: Four times a day (QID) | INTRAMUSCULAR | Status: DC | PRN

## 2023-03-07 MED ORDER — ONDANSETRON HCL 4 MG PO TABS: 4.0000 mg | ORAL_TABLET | Freq: Four times a day (QID) | ORAL | Status: DC | PRN

## 2023-03-07 MED ORDER — LISINOPRIL 20 MG PO TABS
20.0000 mg | ORAL_TABLET | Freq: Two times a day (BID) | ORAL | Status: DC
  Administered 2023-03-08 – 2023-03-11 (×5): 20 mg via ORAL
  Filled 2023-03-07 (×2): qty 1
  Filled 2023-03-07: qty 2
  Filled 2023-03-07 (×4): qty 1

## 2023-03-07 NOTE — Telephone Encounter (Signed)
 Pt left message to call him back, it was an emergency. Pt states his leg stumps and thighs have swollen so much he can't get in his wheelchair. His hands are swelling as well. He states he can't stay at home alone in this condition. He does live alone. He states ED didn't do anything for him last night, but thinking of calling Ambulance to take him back to ED. Please advise.

## 2023-03-07 NOTE — ED Provider Notes (Signed)
 [facility] Provider Note    Event Date/Time   First MD Initiated Contact with Patient 03/07/23 1815     (approximate)   History   Leg Swelling   HPI  [name] is a 68 y.o. male past medical history significant for prior pulmonary embolism on Eliquis , stage IV prostate cancer, who presents to the emergency department with leg swelling.  Patient has a history of bilateral AKA and is wheelchair-bound at baseline.  Worsening swelling that has been significant to bilateral lower extremities.  States that it has significantly worsened over the past 4 to 5 days.  The swelling is so much today that he is unable to fit in his wheelchair any longer.  States that he had to have friends come out to help him get out of his wheelchair.     Physical Exam   Triage Vital Signs: ED Triage Vitals  Encounter Vitals Group     BP 03/07/23 1522 130/71     Systolic BP Percentile --      Diastolic BP Percentile --      Pulse Rate 03/07/23 1522 91     Resp 03/07/23 1522 18     Temp 03/07/23 1522 98.1 F (36.7 C)     Temp Source 03/07/23 1522 Oral     SpO2 03/07/23 1522 98 %     Weight 03/07/23 1519 286 lb 9.6 oz (130 kg)     Height 03/07/23 1519 4' 10 (1.473 m)     Head Circumference --      Peak Flow --      Pain Score 03/07/23 1519 0     Pain Loc --      Pain Education --      Exclude from Growth Chart --     Most recent vital signs: Vitals:   03/07/23 1522  BP: 130/71  Pulse: 91  Resp: 18  Temp: 98.1 F (36.7 C)  SpO2: 98%    Physical Exam Constitutional:      Appearance: He is well-developed.  HENT:     Head: Atraumatic.  Eyes:     Conjunctiva/sclera: Conjunctivae normal.  Cardiovascular:     Rate and Rhythm: Regular rhythm.  Pulmonary:     Effort: No respiratory distress.  Musculoskeletal:     Cervical back: Normal range of motion.     Right lower leg: Edema present.     Left lower leg: Edema present.     Comments: Bilateral AKA.   Significant lower extremity edema with overlying erythema and warmth.  Induration.  No crepitance.  Skin:    General: Skin is warm.     Comments: Sacral wound, appears to be stage I.  Neurological:     Mental Status: He is alert. Mental status is at baseline.     IMPRESSION / MDM / ASSESSMENT AND PLAN / ED COURSE  I reviewed the triage vital signs and the nursing notes.  Differential diagnosis including DVT, cellulitis, chronic venous stasis, low protein  On chart review patient had a significant workup done a couple of days ago.  CT of the chest with no signs of pulmonary embolism.    RADIOLOGY I independently reviewed imaging, my interpretation of imaging: Ultrasound duplex discussed the results with radiologist on-call, positive for DVT with occlusive thrombus in the left femoral and profundus vein.  LABS (all labs ordered are listed, but only abnormal results are displayed) Labs interpreted as -    Labs Reviewed  BASIC METABOLIC  PANEL - Abnormal; Notable for the following components:      Result Value   Potassium 3.4 (*)    CO2 19 (*)    Creatinine, Ser 0.59 (*)    All other components within normal limits  CBC WITH DIFFERENTIAL/PLATELET - Abnormal; Notable for the following components:   RBC 2.80 (*)    Hemoglobin 9.5 (*)    HCT 28.1 (*)    MCV 100.4 (*)    RDW 16.0 (*)    Lymphs Abs 0.3 (*)    All other components within normal limits  HEPATIC FUNCTION PANEL - Abnormal; Notable for the following components:   Total Protein 6.2 (*)    AST 43 (*)    Total Bilirubin 2.2 (*)    Bilirubin, Direct 0.3 (*)    Indirect Bilirubin 1.9 (*)    All other components within normal limits  BRAIN NATRIURETIC PEPTIDE  BASIC METABOLIC PANEL  CBC  PROTIME-INR  APTT  HEPARIN  LEVEL (UNFRACTIONATED)     MDM    Patient is already on Eliquis , concerned that he failed Eliquis  therapy.  Started on heparin  bolus and infusion.  Consulted hospitalist for admission for acute  DVT.   PROCEDURES:  Critical Care performed: yes  .Critical Care  Performed by: Suzanne Kirsch, MD Authorized by: Suzanne Kirsch, MD   Critical care provider statement:    Critical care time (minutes):  30   Critical care time was exclusive of:  Separately billable procedures and treating other patients   Critical care was necessary to treat or prevent imminent or life-threatening deterioration of the following conditions: DVT, failed eliquis , started heparin .   Critical care was time spent personally by me on the following activities:  Development of treatment plan with patient or surrogate, discussions with consultants, evaluation of patient's response to treatment, examination of patient, ordering and review of laboratory studies, ordering and review of radiographic studies, ordering and performing treatments and interventions, pulse oximetry, re-evaluation of patient's condition and review of old charts   Care discussed with: admitting provider     Patient's presentation is most consistent with acute presentation with potential threat to life or bodily function.   MEDICATIONS ORDERED IN ED: Medications  oxyCODONE -acetaminophen  (PERCOCET/ROXICET) 5-325 MG per tablet 1 tablet (has no administration in time range)  darolutamide  (NUBEQA ) tablet 600 mg (has no administration in time range)  lisinopril  (ZESTRIL ) tablet 20 mg (has no administration in time range)  buPROPion  (WELLBUTRIN  XL) 24 hr tablet 300 mg (has no administration in time range)  senna-docusate (Senokot-S) tablet 2 tablet (has no administration in time range)  gabapentin  (NEURONTIN ) capsule 600 mg (has no administration in time range)  acetaminophen  (TYLENOL ) tablet 650 mg (has no administration in time range)    Or  acetaminophen  (TYLENOL ) suppository 650 mg (has no administration in time range)  traZODone  (DESYREL ) tablet 25 mg (has no administration in time range)  ondansetron  (ZOFRAN ) tablet 4 mg (has no  administration in time range)    Or  ondansetron  (ZOFRAN ) injection 4 mg (has no administration in time range)  potassium chloride  (KLOR-CON ) packet 40 mEq (has no administration in time range)  ondansetron  (ZOFRAN -ODT) disintegrating tablet 4 mg (4 mg Oral Given 03/07/23 1531)    FINAL CLINICAL IMPRESSION(S) / ED DIAGNOSES   Final diagnoses:  DVT, lower extremity, proximal, acute, left (HCC)     Rx / DC Orders   ED Discharge Orders     None        Note:  This  document was prepared using Conservation officer, historic buildings and may include unintentional dictation errors.   Suzanne Kirsch, MD 03/07/23 2316

## 2023-03-07 NOTE — H&P (Signed)
 [place]   PATIENT NAME: [name]    MR#:  [id number]  DATE OF BIRTH:  1955/10/21  DATE OF ADMISSION:  03/07/2023  PRIMARY CARE PHYSICIAN: Eliverto Bette Hover, MD   Patient is coming from: Home  REQUESTING/REFERRING PHYSICIAN: Suzanne Kirsch, MD  CHIEF COMPLAINT:   Chief Complaint  Patient presents with  . Leg Swelling    HISTORY OF PRESENT ILLNESS:  [name] is a 68 y.o. 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  He denied any fever or chills.  He continues to take his Eliquis  regularly.  He denied any chest pain or cough or wheezing or hemoptysis or dyspnea.  No fever or chills.  No dysuria, oliguria or hematuria or flank pain.  No bleeding diathesis.  ED Course: When he came to the ER, vital signs were within normal.  Labs revealed potassium of 3.4 and CO2 of 19 with total protein of 6.2 and albumin  3.5, total bili of 2.2.  CBC showed anemia with hemoglobin 9.5 and hematocrit 28.1 close baseline with macrocytosis.  INR is 1.3 and PT 16.1. EKG as reviewed by me : EKG on 1/1 revealed normal sinus rhythm with a rate of 78 with first-degree AV block and low voltage QRS and Q waves inferiorly. Imaging: Bilateral lower extremity venous Doppler showed DVT with occlusive thrombus in the left femoral and profundus femoral vein with no DVT in the right lower extremity.  The patient was given IV heparin  bolus and drip.  He will be admitted to a medical bed for further evaluation and management. PAST MEDICAL HISTORY:   Past Medical History:  Diagnosis Date  . Depression   . History of blood clots 08/13/2011  . History of left knee replacement   . HTN (hypertension)   . Lymphedema     lt leg-per pt  . PE (pulmonary thromboembolism) (HCC)   . Pressure injury of skin, unspecified injury stage, unspecified location   . S/P AKA (above knee amputation) bilateral (HCC)    rt 11/24/07 and lt 01/11/17  -Stage IV prostate cancer  PAST SURGICAL HISTORY:   Past Surgical History:  Procedure Laterality Date  . ACHILLES TENDON SURGERY Left    per pt  . bone clip removal Left 10/04/2015   knee  . CARDIAC SURGERY  11/2012   cath.-per pt  . CERVICAL FUSION  07/28/2015   per pt  . EPIGASTRIC HERNIA REPAIR     per pt  . hematoma removal Left 11/02/2015   knee  . HIP SURGERY Right    x8 per pt  . IR IMAGING GUIDED PORT INSERTION  11/14/2022  . KNEE FUSION Right   . KNEE JOINT MANIPULATION Left 09/11/2011  . KNEE SURGERY Left    x3  . leg stump Left 03/2017   leg surgery-per pt  . NECK SURGERY  02/2015   plates-per pt  . REPLACEMENT TOTAL KNEE Right   . REPLACEMENT TOTAL KNEE Left 07/02/2011   again in 07/10/2011 per pt  . s/p of bilateral AKA Right   . screen filter removal  10/20/2008   for embolism per pt  . screen filter replacement     x3  . SHOULDER SURGERY Right    x2-per pt  . SPINE  SURGERY  02/11/2015   vertebra spacer-per pt  . toe nail removal Right    per pt  . VARICOSE VEIN SURGERY     x5-per pt    SOCIAL HISTORY:   Social History   Tobacco Use  . Smoking status: Never  . Smokeless tobacco: Former  Substance Use Topics  . Alcohol use: Yes    Alcohol/week: 3.0 standard drinks of alcohol    Types: 3 Glasses of wine per week    FAMILY HISTORY:   Family History  Problem Relation Age of Onset  . Alzheimer's disease Mother   . Lung cancer Father     DRUG ALLERGIES:   Allergies  Allergen Reactions  . Bacitracin Hives  . Vancomycin  Other (See Comments) and Rash    Red man syndrome.  SABRA Ketorolac     Other Reaction(s): Kidney Disorder  Other reaction(s): Kidney Disorder  . Spironolactone     Other Reaction(s): Unknown  Other  reaction(s): Unknown    REVIEW OF SYSTEMS:   ROS As per history of present illness. All pertinent systems were reviewed above. Constitutional, HEENT, cardiovascular, respiratory, GI, GU, musculoskeletal, neuro, psychiatric, endocrine, integumentary and hematologic systems were reviewed and are otherwise negative/unremarkable except for positive findings mentioned above in the HPI.   MEDICATIONS AT HOME:   Prior to Admission medications   Medication Sig Start Date End Date Taking? Authorizing Provider  apixaban  (ELIQUIS ) 5 MG TABS tablet Take 1 tablet (5 mg total) by mouth 2 (two) times daily. 02/06/23   Borders, Fonda SAUNDERS, NP  buPROPion  (WELLBUTRIN  XL) 300 MG 24 hr tablet Take 300 mg by mouth daily. 07/26/22   [provider]  darolutamide  (NUBEQA ) 300 MG tablet Take 600 mg by mouth 2 (two) times daily with a meal.    Rennie Cindy SAUNDERS, MD  gabapentin  (NEURONTIN ) 300 MG capsule Take 2 capsules (600 mg total) by mouth 3 (three) times daily. 02/15/23 06/15/23  Borders, Fonda SAUNDERS, NP  lidocaine -prilocaine  (EMLA ) cream Apply 1 Application topically as needed. 11/27/22   Brahmanday, Govinda R, MD  lisinopril  (ZESTRIL ) 20 MG tablet Take 20 mg by mouth 2 (two) times daily. 06/29/19 01/01/23  [provider]  ondansetron  (ZOFRAN ) 8 MG tablet Take 1 tablet (8 mg total) by mouth every 8 (eight) hours as needed for nausea or vomiting. 11/29/22   Brahmanday, Govinda R, MD  oxyCODONE -acetaminophen  (PERCOCET/ROXICET) 5-325 MG tablet Take 1 tablet by mouth every 12 (twelve) hours as needed for moderate pain (pain score 4-6). 01/02/23   Borders, Fonda SAUNDERS, NP  senna-docusate (SENOKOT-S) 8.6-50 MG tablet Take 2 tablets by mouth 2 (two) times daily as needed for mild constipation. 10/01/22   Laurita Pillion, MD      VITAL SIGNS:  Blood pressure 127/63, pulse 91, temperature 98.6 F (37 C), temperature source Oral, resp. rate 20, height 4' 10 (1.473 m), weight 130 kg, SpO2 97%.  PHYSICAL  EXAMINATION:  Physical Exam  GENERAL:  68 y.o.-year-old Caucasian male patient lying in the bed with no acute distress.  EYES: Pupils equal, round, reactive to light and accommodation. No scleral icterus. Extraocular muscles intact.  HEENT: Head atraumatic, normocephalic. Oropharynx and nasopharynx clear.  NECK:  Supple, no jugular venous distention. No thyroid enlargement, no tenderness.  LUNGS: Normal breath sounds bilaterally, no wheezing, rales,rhonchi or crepitation. No use of accessory muscles of respiration.  CARDIOVASCULAR: Regular rate and rhythm, S1, S2 normal. No murmurs, rubs, or gallops.  ABDOMEN: Soft, nondistended, nontender. Bowel sounds present. No organomegaly or  mass.  EXTREMITIES: No pedal edema, cyanosis, or clubbing.  NEUROLOGIC: Cranial nerves II through XII are intact. Muscle strength 5/5 in all extremities. Sensation intact. Gait not checked.  PSYCHIATRIC: The patient is alert and oriented x 3.  Normal affect and good eye contact. SKIN: Bilateral stump pitting edema with left stump mild erythema with warmth and tenderness.  LABORATORY PANEL:   CBC Recent Labs  Lab 03/07/23 1521  WBC 4.4  HGB 9.5*  HCT 28.1*  PLT 155   ------------------------------------------------------------------------------------------------------------------  Chemistries  Recent Labs  Lab 03/07/23 1521  NA 135  K 3.4*  CL 103  CO2 19*  GLUCOSE 96  BUN 16  CREATININE 0.59*  CALCIUM 9.1  AST 43*  ALT 15  ALKPHOS 98  BILITOT 2.2*   ------------------------------------------------------------------------------------------------------------------  Cardiac Enzymes No results for input(s): TROPONINI in the last 168 hours. ------------------------------------------------------------------------------------------------------------------  RADIOLOGY:  US  Venous Img Lower Bilateral Result Date: 03/07/2023 CLINICAL DATA:  Leg swelling.  Bilateral AKA. EXAM: BILATERAL LOWER  EXTREMITY VENOUS DOPPLER ULTRASOUND TECHNIQUE: Gray-scale sonography with graded compression, as well as color Doppler and duplex ultrasound were performed to evaluate the lower extremity deep venous systems from the level of the common femoral vein and including the common femoral, femoral, profunda femoral, popliteal and calf veins including the posterior tibial, peroneal and gastrocnemius veins when visible. The superficial great saphenous vein was also interrogated. Spectral Doppler was utilized to evaluate flow at rest and with distal augmentation maneuvers in the common femoral, femoral and popliteal veins. COMPARISON:  11/21/2016 FINDINGS: RIGHT LOWER EXTREMITY Common Femoral Vein: No evidence of thrombus. Normal compressibility, respiratory phasicity and response to augmentation. Saphenofemoral Junction: No evidence of thrombus. Normal compressibility and flow on color Doppler imaging. Profunda Femoral Vein: No evidence of thrombus. Normal compressibility and flow on color Doppler imaging. Femoral Vein: No evidence of thrombus. Normal compressibility, respiratory phasicity and response to augmentation. Popliteal Vein: Above the knee amputation. Calf Veins: Above the knee amputation. LEFT LOWER EXTREMITY Common Femoral Vein: No evidence of thrombus. Normal compressibility, respiratory phasicity and response to augmentation. Saphenofemoral Junction: No evidence of thrombus. Normal compressibility and flow on color Doppler imaging. Profunda Femoral Vein: Occlusive thrombus. Femoral Vein: Occlusive thrombus. Popliteal Vein: Above the knee amputation. Calf Veins: Above the knee amputation. IMPRESSION: 1. Positive for DVT with occlusive thrombus in the left femoral and profundus femoral vein. 2. No DVT in the right lower extremity. These results were called by telephone at the time of interpretation on 03/07/2023 at 10:35 pm to provider [facility] , who verbally acknowledged these results. Electronically Signed    By: Andrea Gasman M.D.   On: 03/07/2023 22:35   CT Angio Chest PE W/Cm &/Or Wo Cm Result Date: 03/06/2023 CLINICAL DATA:  Shortness of breath with exertion EXAM: CT ANGIOGRAPHY CHEST WITH CONTRAST TECHNIQUE: Multidetector CT imaging of the chest was performed using the standard protocol during bolus administration of intravenous contrast. Multiplanar CT image reconstructions and MIPs were obtained to evaluate the vascular anatomy. RADIATION DOSE REDUCTION: This exam was performed according to the departmental dose-optimization program which includes automated exposure control, adjustment of the mA and/or kV according to patient size and/or use of iterative reconstruction technique. CONTRAST:  OMNIPAQUE  IOHEXOL  350 MG/ML SOLN COMPARISON:  Chest x-ray from earlier in the same day, CT from 09/30/2022. FINDINGS: Cardiovascular: Atherosclerotic calcifications of the thoracic aorta are noted. Dilatation of the ascending aorta to 4.3 cm is noted. No evidence of dissection is seen. The pulmonary artery shows a  normal branching pattern bilaterally. No definitive filling defect to suggest pulmonary embolism is seen. Heart is mildly enlarged in size. No significant coronary calcifications noted. Venous collaterals are noted about the left shoulder and neck suspicious for central left subclavian vein narrowing. Right chest wall port is noted. Mediastinum/Nodes: No hilar or mediastinal adenopathy is noted. The esophagus as visualized is within normal limits. Thoracic inlet is unremarkable. Lungs/Pleura: Lungs are well aerated bilaterally. No focal infiltrate or sizable effusion is seen. Upper Abdomen: Upper abdomen shows multiple gallstones without complicating factors. Simple appearing left renal cysts are noted stable from prior exams. Musculoskeletal: Postsurgical changes at the cervicothoracic junction are seen. Scattered areas of sclerosis throughout the bony structures consistent with metastatic disease. This  appears increased when compared with the prior exam. This is consistent with the patient's given clinical history of prostate carcinoma. Review of the MIP images confirms the above findings. IMPRESSION: No evidence of pulmonary emboli. 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 Recommend annual imaging followup by CTA or MRA. This recommendation follows 2010 ACCF/AHA/AATS/ACR/ASA/SCA/SCAI/SIR/STS/SVM Guidelines for the Diagnosis and Management of Patients with Thoracic Aortic Disease. Circulation. 2010; 121: Z733-z630. Aortic aneurysm NOS (ICD10-I71.9) Changes consistent with prostate metastatic disease in the bony structures. Heavy collaterals around the left chest wall and shoulder suspicious for left subclavian narrowing. Aortic Atherosclerosis (ICD10-I70.0). Electronically Signed   By: Oneil Devonshire M.D.   On: 03/06/2023 21:56   DG Chest 2 View Result Date: 03/06/2023 CLINICAL DATA:  Dyspnea on exertion for several days EXAM: CHEST - 2 VIEW COMPARISON:  None Available. FINDINGS: Frontal and lateral views of the chest demonstrate right chest wall port tip within the superior vena cava. Cardiac silhouette is unremarkable. No airspace disease, effusion, or pneumothorax. No acute bony abnormalities. IMPRESSION: 1. No acute intrathoracic process. Electronically Signed   By: Ozell Daring M.D.   On: 03/06/2023 20:51      IMPRESSION AND PLAN:  Assessment and Plan: * Leg DVT (deep venous thromboembolism), acute, left (HCC) - The patient will be admitted to a medical bed. - We will continue IV heparin  to replace Eliquis  at this time. - The patient may need to continue Lovenox  especially given stage IV prostate cancer. - Hematology consult will be obtained. - I notified Dr. Jacobo about the patient.  Prostate cancer metastatic to bone [facility]) - We will continue his Nubeqa  hormonal therapy. - Pain management will be provided.  Depression - Continue Wellbutrin  XL.  Peripheral neuropathy - Continue  Neurontin .  Essential hypertension - We will continue antihypertensive therapy.   DVT prophylaxis: IV heparin . Advanced Care Planning:  Code Status: full code. Family Communication:  The plan of care was discussed in details with the patient (and family). I answered all questions. The patient agreed to proceed with the above mentioned plan. Further management will depend upon hospital course. Disposition Plan: Back to previous home environment Consults called: Hematology All the records are reviewed and case discussed with ED provider.  Status is: Inpatient   At the time of the admission, it appears that the appropriate admission status for this patient is inpatient.  This is judged to be reasonable and necessary in order to provide the required intensity of service to ensure the patient's safety given the presenting symptoms, physical exam findings and initial radiographic and laboratory data in the context of comorbid conditions.  The patient requires inpatient status due to high intensity of service, high risk of further deterioration and high frequency of surveillance required.  I  certify that at the time of admission, it is my clinical judgment that the patient will require inpatient hospital care extending more than 2 midnights.                            Dispo: The patient is from: Home              Anticipated d/c is to: Home              Patient currently is not medically stable to d/c.              Difficult to place patient: No  Madison DELENA Peaches M.D on 03/08/2023 at 4:05 AM  [facility]   From 7 PM-7 AM, contact night-coverage [email or web address]  CC: Primary care physician; Eliverto Bette Hover, MD

## 2023-03-07 NOTE — Telephone Encounter (Signed)
 I tried calling patient. VM left.

## 2023-03-07 NOTE — ED Notes (Signed)
 Patient discharged from ED by provider. Discharge instructions reviewed with patient and all questions answered. Patient carried via stretcher with [facility] in NAD to home.

## 2023-03-07 NOTE — ED Triage Notes (Signed)
 Patient to ED via [facility] from home for bilateral leg and hand swelling x3 days. Seen for same yesterday. Pt has bilateral AKA.

## 2023-03-07 NOTE — Telephone Encounter (Signed)
 Called to get an update on the hospital bed pt wanted. [employer] does not carry the Striker S3 bed he wants. I advised him to contact his Ins to see who they are in contract with that carries this bed. He will call  back with this info.  Pt was seen in ED last night for bed sores and swelling in his leg stubs, lt>rt. He states he is unable to lift himself at this point, having no upper body strength.   Pt states he has slept in his wheelchair the last 5 nights. Having panic attacks and feeling like he can't breathe.  He would like to discuss Hospice with Josh B.

## 2023-03-07 NOTE — ED Provider Triage Note (Signed)
 Emergency Medicine Provider Triage Evaluation Note  Sencere Symonette , a 68 y.o. male  was evaluated in triage.  Pt complains of peripheral edema. Pt was seen in the ED last night with unremarkable work up. Patient tried to reach out to the cancer center before returning to the ED today. He is returning to the ED because he feels like the swelling is getting worse and now has swelling on the backs of his hands.  Fonda Borders PA-C at the cancer center over sees his care with Dr. Rennie. Patient requesting that they be informed he is in the ED.  Review of Systems  Positive: Leg swelling, leg pain, hand swelling, nausea, SOB Negative: CP  Physical Exam  There were no vitals taken for this visit. Gen:   Awake, no distress   Resp:  Normal effort  MSK:   Moves extremities without difficulty  Other:  Bilateral hands are swollen, bilateral legs swollen and erythematous  Medical Decision Making  Medically screening exam initiated at 3:15 PM.  Appropriate orders placed.  Jaymen Fetch was informed that the remainder of the evaluation will be completed by another provider, this initial triage assessment does not replace that evaluation, and the importance of remaining in the ED until their evaluation is complete.     Cleaster Tinnie LABOR, PA-C 03/07/23 1520

## 2023-03-08 DIAGNOSIS — C61 Malignant neoplasm of prostate: Secondary | ICD-10-CM | POA: Diagnosis not present

## 2023-03-08 DIAGNOSIS — G629 Polyneuropathy, unspecified: Secondary | ICD-10-CM

## 2023-03-08 DIAGNOSIS — I10 Essential (primary) hypertension: Secondary | ICD-10-CM

## 2023-03-08 DIAGNOSIS — C7951 Secondary malignant neoplasm of bone: Secondary | ICD-10-CM | POA: Diagnosis not present

## 2023-03-08 DIAGNOSIS — F32A Depression, unspecified: Secondary | ICD-10-CM

## 2023-03-08 DIAGNOSIS — I82409 Acute embolism and thrombosis of unspecified deep veins of unspecified lower extremity: Secondary | ICD-10-CM | POA: Insufficient documentation

## 2023-03-08 DIAGNOSIS — I824Y2 Acute embolism and thrombosis of unspecified deep veins of left proximal lower extremity: Principal | ICD-10-CM | POA: Insufficient documentation

## 2023-03-08 LAB — HEPARIN LEVEL (UNFRACTIONATED)
Heparin Unfractionated: 0.61 [IU]/mL (ref 0.30–0.70)
Heparin Unfractionated: >1.1 [IU]/mL — ABNORMAL HIGH (ref 0.30–0.70)

## 2023-03-08 LAB — CBC
HCT: 26.7 % — ABNORMAL LOW (ref 39.0–52.0)
Hemoglobin: 8.8 g/dL — ABNORMAL LOW (ref 13.0–17.0)
MCH: 33.8 pg (ref 26.0–34.0)
MCHC: 33 g/dL (ref 30.0–36.0)
MCV: 102.7 fL — ABNORMAL HIGH (ref 80.0–100.0)
Platelets: 135 10*3/uL — ABNORMAL LOW (ref 150–400)
RBC: 2.6 MIL/uL — ABNORMAL LOW (ref 4.22–5.81)
RDW: 15.8 % — ABNORMAL HIGH (ref 11.5–15.5)
WBC: 3.4 10*3/uL — ABNORMAL LOW (ref 4.0–10.5)
nRBC: 0 % (ref 0.0–0.2)

## 2023-03-08 LAB — BASIC METABOLIC PANEL
Anion gap: 11 (ref 5–15)
BUN: 14 mg/dL (ref 8–23)
CO2: 21 mmol/L — ABNORMAL LOW (ref 22–32)
Calcium: 9.1 mg/dL (ref 8.9–10.3)
Chloride: 103 mmol/L (ref 98–111)
Creatinine, Ser: 0.58 mg/dL — ABNORMAL LOW (ref 0.61–1.24)
GFR, Estimated: >60 mL/min (ref >60)
Glucose, Bld: 91 mg/dL (ref 70–99)
Potassium: 3.6 mmol/L (ref 3.5–5.1)
Sodium: 135 mmol/L (ref 135–145)

## 2023-03-08 LAB — FOLATE: Folate: 9.1 ng/mL (ref >5.9)

## 2023-03-08 LAB — RETICULOCYTES
Immature Retic Fract: 17.2 % — ABNORMAL HIGH (ref 2.3–15.9)
RBC.: 2.57 MIL/uL — ABNORMAL LOW (ref 4.22–5.81)
Retic Count, Absolute: 60.7 10*3/uL (ref 19.0–186.0)
Retic Ct Pct: 2.4 % (ref 0.4–3.1)

## 2023-03-08 LAB — APTT
aPTT: 33 s (ref 24–36)
aPTT: 82 s — ABNORMAL HIGH (ref 24–36)

## 2023-03-08 LAB — PROTIME-INR
INR: 1.3 — ABNORMAL HIGH (ref 0.8–1.2)
Prothrombin Time: 16.1 s — ABNORMAL HIGH (ref 11.4–15.2)

## 2023-03-08 LAB — IRON AND TIBC
Iron: 75 ug/dL (ref 45–182)
Saturation Ratios: 27 % (ref 17.9–39.5)
TIBC: 281 ug/dL (ref 250–450)
UIBC: 206 ug/dL

## 2023-03-08 LAB — VITAMIN B12: Vitamin B-12: 683 pg/mL (ref 180–914)

## 2023-03-08 LAB — FERRITIN: Ferritin: 357 ng/mL — ABNORMAL HIGH (ref 24–336)

## 2023-03-08 MED ORDER — LOPERAMIDE HCL 2 MG PO CAPS
2.0000 mg | ORAL_CAPSULE | ORAL | Status: DC | PRN
  Administered 2023-03-08 – 2023-03-09 (×3): 2 mg via ORAL
  Filled 2023-03-08 (×3): qty 1

## 2023-03-08 MED ORDER — ENOXAPARIN SODIUM 150 MG/ML IJ SOSY
1.0000 mg/kg | PREFILLED_SYRINGE | Freq: Two times a day (BID) | INTRAMUSCULAR | Status: DC
  Administered 2023-03-08 – 2023-03-11 (×6): 129 mg via SUBCUTANEOUS
  Filled 2023-03-08 (×8): qty 0.86

## 2023-03-08 MED ORDER — HEPARIN (PORCINE) 25000 UT/250ML-% IV SOLN
1350.0000 [IU]/h | INTRAVENOUS | Status: DC
  Administered 2023-03-08: 1350 [IU]/h via INTRAVENOUS
  Filled 2023-03-08: qty 250

## 2023-03-08 MED ORDER — HEPARIN BOLUS VIA INFUSION
5000.0000 [IU] | Freq: Once | INTRAVENOUS | Status: AC
  Administered 2023-03-08: 5000 [IU] via INTRAVENOUS
  Filled 2023-03-08: qty 5000

## 2023-03-08 MED ORDER — OXYCODONE-ACETAMINOPHEN 5-325 MG PO TABS
1.0000 | ORAL_TABLET | Freq: Four times a day (QID) | ORAL | Status: DC | PRN
  Administered 2023-03-08 – 2023-03-09 (×2): 1 via ORAL
  Filled 2023-03-08 (×2): qty 1

## 2023-03-08 NOTE — TOC Initial Note (Signed)
 Transition of Care [facility]) - Initial/Assessment Note    Patient Details  Name: [name] MRN: [id number] Date of Birth: 1955/11/20  Transition of Care [facility]) CM/SW Contact:    Raynor Calcaterra E Cylis Ayars, LCSW Phone Number: 03/08/2023, 1:43 PM  Clinical Narrative:                 CSW spoke with patient at bedside in the ED. Patient states he is from home and is active with [facility] for Home Health Nursing and would like to resume those services when discharged. CSW called Larraine with [facility], she confirmed that they are active . Notified Larraine of plan to DC home tomorrow.  Asked MD for [facility] orders.  Expected Discharge Plan: Home w Home Health Services Barriers to Discharge: Continued Medical Work up   Patient Goals and CMS Choice Patient states their goals for this hospitalization and ongoing recovery are:: resume home health RN services CMS Medicare.gov Compare Post Acute Care list provided to:: Patient Choice offered to / list presented to : Patient      Expected Discharge Plan and Services       Living arrangements for the past 2 months: Single Family Home                           HH Arranged: RN [facility] Agency: [facility] Date [facility] Agency Contacted: 03/08/23   Representative spoke with at [facility] Agency: Larraine  Prior Living Arrangements/Services Living arrangements for the past 2 months: Single Family Home   Patient language and need for interpreter reviewed:: Yes Do you feel safe going back to the place where you live?: Yes      Need for Family Participation in Patient Care: Yes (Comment) Care giver support system in place?: Yes (comment)   Criminal Activity/Legal Involvement Pertinent to Current Situation/Hospitalization: No - Comment as needed  Activities of Daily Living      Permission Sought/Granted Permission sought to share information with : Facility Industrial/product Designer granted to share information with : Yes, Verbal Permission Granted      Permission granted to share info w AGENCY: [facility]        Emotional Assessment       Orientation: : Oriented to Self, Oriented to Place, Oriented to  Time, Oriented to Situation Alcohol / Substance Use: Not Applicable Psych Involvement: No (comment)  Admission diagnosis:  Leg DVT (deep venous thromboembolism), acute, left (HCC) [I82.402] Cellulitis of right leg [L03.115] Patient Active Problem List   Diagnosis Date Noted   Essential hypertension 03/08/2023   Peripheral neuropathy 03/08/2023   DVT, lower extremity, proximal, acute, left (HCC) 03/08/2023   Leg DVT (deep venous thromboembolism), acute, left (HCC) 03/07/2023   Cellulitis of right leg 03/07/2023   Genetic testing 01/09/2023   Morbid obesity with BMI of 60.0-69.9, adult (HCC) 10/01/2022   Prostate cancer metastatic to bone (HCC) 09/30/2022   Back pain 09/30/2022   Obesity 09/30/2022   Diarrhea 09/30/2022   Pain from bone metastases (HCC) 09/30/2022   S/P AKA (above knee amputation) bilateral (HCC)    PE (pulmonary thromboembolism) (HCC)    Depression    PCP:  Eliverto Bette Hover, MD Pharmacy:   [facility] [address], [place] - [address] [place] [address] Phone: [phone] Fax: [phone]  [facility] [place], [place] - [address] [place]  [address] Phone: [phone] Fax: [phone]     Social Drivers of Health (SDOH) Social History: SDOH Screenings   Food Insecurity: No Food Insecurity (09/30/2022)  Housing: Low Risk  (09/30/2022)  Transportation Needs: No Transportation Needs (09/30/2022)  Utilities: Not At Risk (09/30/2022)  Financial Resource Strain: High Risk (07/28/2021)   Received from [facility], [facility]  Physical Activity: Sufficiently Active (07/28/2021)   Received from [facility], [facility]  Social Connections:  Socially Isolated (08/01/2021)   Received from [facility], [facility]  Stress: Stress Concern Present (08/01/2021)   Received from [facility], [facility]  Tobacco Use: Medium Risk (03/07/2023)   SDOH Interventions:     Readmission Risk Interventions     No data to display

## 2023-03-08 NOTE — Assessment & Plan Note (Addendum)
-   We will continue home antihypertensive therapy.

## 2023-03-08 NOTE — Assessment & Plan Note (Signed)
 Continue Wellbutrin XL

## 2023-03-08 NOTE — Progress Notes (Addendum)
 PHARMACY - ANTICOAGULATION CONSULT NOTE  Pharmacy Consult for Heparin   Indication: DVT  Allergies  Allergen Reactions   Bacitracin Hives   Vancomycin  Other (See Comments) and Rash    Red man syndrome.   Ketorolac     Other Reaction(s): Kidney Disorder  Other reaction(s): Kidney Disorder   Spironolactone     Other Reaction(s): Unknown  Other reaction(s): Unknown    Patient Measurements: Height: 4' 10 (147.3 cm) Weight: 130 kg (286 lb 9.6 oz) IBW/kg (Calculated) : 45.4 Heparin  Dosing Weight: 78.7 kg  Vital Signs: Temp: 98.6 F (37 C) (01/02 2341) Temp Source: Oral (01/02 2341) BP: 127/63 (01/02 2341) Pulse Rate: 91 (01/02 2341)  Labs: Recent Labs    03/06/23 1911 03/06/23 2157 03/07/23 1521 03/07/23 2334  HGB 9.6*  --  9.5*  --   HCT 29.6*  --  28.1*  --   PLT 168  --  155  --   APTT  --   --   --  33  LABPROT  --   --   --  16.1*  INR  --   --   --  1.3*  HEPARINUNFRC  --   --   --  0.61  CREATININE 0.61  --  0.59*  --   TROPONINIHS 7 8  --   --     Estimated Creatinine Clearance: 100.4 mL/min (A) (by C-G formula based on SCr of 0.59 mg/dL (L)).   Medical History: Past Medical History:  Diagnosis Date   Depression    History of blood clots 08/13/2011   History of left knee replacement    HTN (hypertension)    Lymphedema    lt leg-per pt   PE (pulmonary thromboembolism) (HCC)    Pressure injury of skin, unspecified injury stage, unspecified location    S/P AKA (above knee amputation) bilateral (HCC)    rt 11/24/07 and lt 01/11/17    Medications:  (Not in a hospital admission)   Assessment: Pharmacy consulted to dose heparin  in this 68 year old male with DVT.  Pt was on Eliquis  5 mg PO BID PTA, last dose on 01/01 AM.    CrCl = 100.4 ml/min   Goal of Therapy:  aPTT:  66 - 102  Heparin  level 0.3-0.7 units/ml Monitor platelets by anticoagulation protocol: Yes   Plan:  Give 5000 units bolus x 1 Start heparin  infusion at 1350 units/hr Check  anti-Xa level in 6 hours and daily while on heparin  Continue to monitor H&H and platelets - Will use aPTT to guide dosing until correlating with HL   Lew Prout D 03/08/2023,12:59 AM

## 2023-03-08 NOTE — Assessment & Plan Note (Signed)
 Continue Neurontin

## 2023-03-08 NOTE — Progress Notes (Signed)
 Progress Note   Patient: [name] FMW:[id number] DOB: [birth year] DOA: 03/07/2023     1 DOS: the patient was seen and examined on 03/08/2023   Brief hospital course: Taken from H&P.  Cheryl Stabenow is a 68 y.o. 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  He denied any fever or chills.  He continues to take his Eliquis  regularly.   On presentation vitals stable, labs with potassium 3.4, CO2 19, T. bili 2.2, hemoglobin 9.5, close to baseline with macrocytosis, INR 1.3. EKG with NSR and first-degree heart block, low voltage and Q waves inferiorly Bilateral lower extremity venous Doppler showed DVT with occlusive thrombus in the left femoral and profundus femoral vein with no DVT in the right lower extremity.  CTA was negative for PE, did show her dilated ascending aorta at 4.3 cm which will need follow-up.  Changes consistent with prostatic metastatic  disease in the bony structures.  Also suspect patient of left subclavian narrowing with heavy collaterals around the left chest wall.  1/3 : Vital stable, labs with macrocytic anemia with hemoglobin of 8.8, platelet decreased to 135 from 155.  Vascular surgery was also consulted to evaluate for any surgical intervention and IVC filter placement, apparently due to his underlying hypercoagulable state with active malignancy he is not a candidate for any surgical intervention.  He will be high risk for IVC thrombosis with IVC filter.  Risk and benefits were discussed with patient and he decided not to do any intervention at this time.  Initially received heparin  infusion and now being transitioned to Lovenox .    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  Likely hypercoagulable state with underlying active malignancy. Vascular surgery was consulted but no vascular intervention needed at this time and patient will remain high risk for thromboembolism. -Initially started on heparin  infusion which is being transitioned to Lovenox  now. -Patient will be discharging home tomorrow on Lovenox  instead of Eliquis   Prostate cancer metastatic to bone [facility]) - We will continue his Nubeqa  hormonal therapy. - Pain management will be provided.  Depression - Continue Wellbutrin  XL.  Essential hypertension - We will continue home antihypertensive therapy.  Peripheral neuropathy - Continue Neurontin .   Subjective: Patient was still having some left leg pain.  He was compliant with his Eliquis .  Has been used Lovenox  in the past.  Physical Exam: Vitals:   03/07/23 1522 03/07/23 2341 03/08/23 0409 03/08/23 0754  BP: 130/71 127/63 (!) 107/55 119/65  Pulse: 91 91 80 80  Resp: 18 20 (!) 22 18  Temp: 98.1 F (36.7 C) 98.6 F (37 C) 99.2 F (37.3 C) 98.4 F (36.9 C)  TempSrc: Oral Oral Oral Oral  SpO2: 98% 97% 94% 95%  Weight:      Height:       General.  Morbidly obese gentleman, in no acute distress. Pulmonary.  Lungs clear bilaterally, normal respiratory effort. CV.  Regular rate and rhythm, no JVD, rub or murmur. Abdomen.  Soft, nontender, nondistended, BS positive. CNS.  Alert and oriented .  No focal neurologic deficit. Extremities.  Bilateral AKA Psychiatry.  Judgment and insight appears normal.   Data Reviewed: Prior data reviewed  Family  Communication: Discussed with patient  Disposition: Status is: Observation The patient remains OBS appropriate and will d/c before 2 midnights.  Planned Discharge Destination: Home with Home Health   Time spent: 45 minutes  This record has been created using Conservation officer, historic buildings. Errors have been  sought and corrected,but may not always be located. Such creation errors do not reflect on the standard of care.   Author: Amaryllis Dare, MD 03/08/2023 1:35 PM  For on call review [email or web address].

## 2023-03-08 NOTE — Hospital Course (Addendum)
 Taken from H&P.  [name] is a 68 y.o. 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  He denied any fever or chills.  He continues to take his Eliquis  regularly.   On presentation vitals stable, labs with potassium 3.4, CO2 19, T. bili 2.2, hemoglobin 9.5, close to baseline with macrocytosis, INR 1.3. EKG with NSR and first-degree heart block, low voltage and Q waves inferiorly Bilateral lower extremity venous Doppler showed DVT with occlusive thrombus in the left femoral and profundus femoral vein with no DVT in the right lower extremity.  CTA was negative for PE, did show her dilated ascending aorta at 4.3 cm which will need follow-up.  Changes consistent with prostatic metastatic  disease in the bony structures.  Also suspect patient of left subclavian narrowing with heavy collaterals around the left chest wall.  1/3 : Vital stable, labs with macrocytic anemia with hemoglobin of 8.8, platelet decreased to 135 from 155.  Vascular surgery was also consulted to evaluate for any surgical intervention and IVC filter placement, apparently due to his underlying hypercoagulable state with active malignancy he is not a candidate for any surgical intervention.  He will be high risk for IVC thrombosis with IVC filter.  Risk and benefits were discussed with patient and he decided not to do any intervention at this time.  Initially received heparin  infusion and now being transitioned to Lovenox .  1/4: Vital stable, continue to have diarrhea so C. difficile and GI pathogen panel was checked and they were negative.  Complaining of right hip pain with each movement, imaging ordered.  Would like to spend another night so he can become  little more stronger and improvement of diarrhea before going home.  1/5: Hemodynamically stable, continue to have diarrhea.  Asking for PT evaluation for right hip pain.  Does not think that he can go home to take care of himself and would like this diarrhea to improved before leaving.  Added Lomotil  as Imodium  was not very helpful.  Patient will also need EMS transfer to home.  1/6: Remained hemodynamically stable.  Diarrhea seems improving with Lomotil , prescription was provided.  Hospital bed is being arranged by palliative care at cancer center.  Patient is being discharged on Lovenox  for DVT recurrence despite being on Eliquis .  Patient will continue the rest of his home medications.  Home health services were ordered as he does not want to go to rehab.  Patient need to follow-up with his providers for further assistance.

## 2023-03-08 NOTE — Assessment & Plan Note (Addendum)
-   We will continue his Nubeqa hormonal therapy. - Pain management will be provided.

## 2023-03-08 NOTE — Care Management CC44 (Signed)
 Condition Code 44 Documentation Completed  Patient Details  Name: [name] MRN: [id number] Date of Birth: 26-Sep-1955   Condition Code 44 given:  Yes Patient signature on Condition Code 44 notice:  Yes Documentation of 2 MD's agreement:  Yes Code 44 added to claim:  Yes  Reviewed with patient at bedside. Copy provided to patient. Patient verbalized understanding.  [name] [name], LCSW 03/08/2023, 1:41 PM

## 2023-03-08 NOTE — Consult Note (Addendum)
 Hospital Consult    Reason for Consult:  Lower Stump Thrombosis Requesting Physician:  Dr Madison Peaches Md  MRN #:  [id number]  History of Present Illness: This is a 68 y.o. male  with medical history significant for depression,, hypertension, PE and osteoarthritis, as well as stage IV prostate cancer status post radiotherapy, on hormonal therapy, who presented to the emergency room with acute onset of worsening left stump swelling with associated warmth, erythema, tenderness and pain.  He denied any fever or chills.  He continues to take his Eliquis  regularly.    On exam this morning patient is resting comfortably in bed in the emergency department.  He endorses that his stage IV cancer is in all of his bones.  He believes that this is part of the problem with this pain in his left stump and may be contributing to his swelling.  On workup patient underwent bilateral lower extremity venous Doppler ultrasounds.  He has noted to have profundofemoral vein with an occlusive thrombus.  This is normal after above-the-knee amputations.  However his common femoral vein shows no evidence of thrombus and neither does his SFA show any evidence of thrombus.  Vascular surgery was consulted to evaluate.  Past Medical History:  Diagnosis Date   Depression    History of blood clots 08/13/2011   History of left knee replacement    HTN (hypertension)    Lymphedema    lt leg-per pt   PE (pulmonary thromboembolism) (HCC)    Pressure injury of skin, unspecified injury stage, unspecified location    S/P AKA (above knee amputation) bilateral (HCC)    rt 11/24/07 and lt 01/11/17    Past Surgical History:  Procedure Laterality Date   ACHILLES TENDON SURGERY Left    per pt   bone clip removal Left 10/04/2015   knee   CARDIAC SURGERY  11/2012   cath.-per pt   CERVICAL FUSION  07/28/2015   per pt   EPIGASTRIC HERNIA REPAIR     per pt   hematoma removal Left 11/02/2015   knee   HIP SURGERY Right    x8 per pt    IR IMAGING GUIDED PORT INSERTION  11/14/2022   KNEE FUSION Right    KNEE JOINT MANIPULATION Left 09/11/2011   KNEE SURGERY Left    x3   leg stump Left 03/2017   leg surgery-per pt   NECK SURGERY  02/2015   plates-per pt   REPLACEMENT TOTAL KNEE Right    REPLACEMENT TOTAL KNEE Left 07/02/2011   again in 07/10/2011 per pt   s/p of bilateral AKA Right    screen filter removal  10/20/2008   for embolism per pt   screen filter replacement     x3   SHOULDER SURGERY Right    x2-per pt   SPINE SURGERY  02/11/2015   vertebra spacer-per pt   toe nail removal Right    per pt   VARICOSE VEIN SURGERY     x5-per pt    Allergies  Allergen Reactions   Bacitracin Hives   Vancomycin  Other (See Comments) and Rash    Red man syndrome.   Ketorolac     Other Reaction(s): Kidney Disorder  Other reaction(s): Kidney Disorder   Spironolactone     Other Reaction(s): Unknown  Other reaction(s): Unknown    Prior to Admission medications   Medication Sig Start Date End Date Taking? Authorizing Provider  apixaban  (ELIQUIS ) 5 MG TABS tablet Take 1 tablet (5 mg total)  by mouth 2 (two) times daily. 02/06/23  Yes Borders, Fonda SAUNDERS, NP  buPROPion  (WELLBUTRIN  XL) 300 MG 24 hr tablet Take 300 mg by mouth daily. 07/26/22  Yes [provider]  darolutamide  (NUBEQA ) 300 MG tablet Take 600 mg by mouth 2 (two) times daily with a meal.   Yes Brahmanday, Govinda R, MD  gabapentin  (NEURONTIN ) 300 MG capsule Take 2 capsules (600 mg total) by mouth 3 (three) times daily. 02/15/23 06/15/23 Yes Borders, Fonda SAUNDERS, NP  lidocaine -prilocaine  (EMLA ) cream Apply 1 Application topically as needed. 11/27/22  Yes Brahmanday, Govinda R, MD  ondansetron  (ZOFRAN ) 8 MG tablet Take 1 tablet (8 mg total) by mouth every 8 (eight) hours as needed for nausea or vomiting. 11/29/22  Yes Brahmanday, Govinda R, MD  oxyCODONE -acetaminophen  (PERCOCET/ROXICET) 5-325 MG tablet Take 1 tablet by mouth every 12 (twelve) hours as needed for  moderate pain (pain score 4-6). 01/02/23  Yes Borders, Fonda SAUNDERS, NP  senna-docusate (SENOKOT-S) 8.6-50 MG tablet Take 2 tablets by mouth 2 (two) times daily as needed for mild constipation. 10/01/22  Yes Laurita Pillion, MD  lisinopril  (ZESTRIL ) 20 MG tablet Take 20 mg by mouth 2 (two) times daily. 06/29/19 01/01/23  [provider]  warfarin (COUMADIN ) 5 MG tablet Take 5 mg by mouth daily at 4 PM. 03/07/23   [provider]    Social History   Socioeconomic History   Marital status: Single    Spouse name: Not on file   Number of children: Not on file   Years of education: Not on file   Highest education level: Not on file  Occupational History   Not on file  Tobacco Use   Smoking status: Never   Smokeless tobacco: Former  Psychologist, Educational Use   Vaping status: Never Used  Substance and Sexual Activity   Alcohol use: Yes    Alcohol/week: 3.0 standard drinks of alcohol    Types: 3 Glasses of wine per week   Drug use: Never   Sexual activity: Not on file  Other Topics Concern   Not on file  Social History Narrative   Not on file   Social Drivers of Health   Financial Resource Strain: High Risk (07/28/2021)   Received from [facility], [facility]   Overall Financial Resource Strain (CARDIA)    Difficulty of Paying Living Expenses: Very hard  Food Insecurity: No Food Insecurity (09/30/2022)   Hunger Vital Sign    Worried About Running Out of Food in the Last Year: Never true    Ran Out of Food in the Last Year: Never true  Transportation Needs: No Transportation Needs (09/30/2022)   PRAPARE - Administrator, Civil Service (Medical): No    Lack of Transportation (Non-Medical): No  Physical Activity: Sufficiently Active (07/28/2021)   Received from [facility], [facility]   Exercise Vital Sign    Days of Exercise per Week: 7 days    Minutes of Exercise per Session: 60 min  Stress: Stress  Concern Present (08/01/2021)   Received from [facility], [facility]   [employer] of Occupational Health - Occupational Stress Questionnaire    Feeling of Stress : Very much  Social Connections: Socially Isolated (08/01/2021)   Received from [facility], [facility]   Social Connection and Isolation Panel [NHANES]    Frequency of Communication with Friends and Family: Once a week  Frequency of Social Gatherings with Friends and Family: Never    Attends Religious Services: Never    Database Administrator or Organizations: Yes    Attends Banker Meetings: Never    Marital Status: Separated  Intimate Partner Violence: Not At Risk (09/30/2022)   Humiliation, Afraid, Rape, and Kick questionnaire    Fear of Current or Ex-Partner: No    Emotionally Abused: No    Physically Abused: No    Sexually Abused: No     Family History  Problem Relation Age of Onset   Alzheimer's disease Mother    Lung cancer Father     ROS: Otherwise negative unless mentioned in HPI  Physical Examination  Vitals:   03/08/23 0409 03/08/23 0754  BP: (!) 107/55 119/65  Pulse: 80 80  Resp: (!) 22 18  Temp: 99.2 F (37.3 C) 98.4 F (36.9 C)  SpO2: 94% 95%   Body mass index is 59.9 kg/m.  General:  WDWN in NAD Gait: Not observed HENT: WNL, normocephalic Pulmonary: normal non-labored breathing, without Rales, rhonchi,  wheezing Cardiac: regular, without  Murmurs, rubs or gallops; without carotid bruits Abdomen: Positive bowel sounds throughout, soft, NT/ND, no masses Skin: without rashes Vascular Exam/Pulses: Palpable upper extremity pulses.  Extremities: without ischemic changes, without Gangrene , with cellulitis; without open wounds;  Musculoskeletal: no muscle wasting or atrophy  Neurologic: A&O X 3;  No focal weakness or paresthesias are detected; speech is fluent/normal Psychiatric:  The pt has Normal  affect. Lymph:  Unremarkable  CBC    Component Value Date/Time   WBC 3.4 (L) 03/08/2023 0449   RBC 2.60 (L) 03/08/2023 0449   RBC 2.57 (L) 03/08/2023 0449   HGB 8.8 (L) 03/08/2023 0449   HGB 8.9 (L) 02/25/2023 0820   HCT 26.7 (L) 03/08/2023 0449   PLT 135 (L) 03/08/2023 0449   PLT 193 02/25/2023 0820   MCV 102.7 (H) 03/08/2023 0449   MCH 33.8 03/08/2023 0449   MCHC 33.0 03/08/2023 0449   RDW 15.8 (H) 03/08/2023 0449   LYMPHSABS 0.3 (L) 03/07/2023 1521   MONOABS 0.4 03/07/2023 1521   EOSABS 0.1 03/07/2023 1521   BASOSABS 0.0 03/07/2023 1521    BMET    Component Value Date/Time   NA 135 03/08/2023 0449   K 3.6 03/08/2023 0449   CL 103 03/08/2023 0449   CO2 21 (L) 03/08/2023 0449   GLUCOSE 91 03/08/2023 0449   BUN 14 03/08/2023 0449   CREATININE 0.58 (L) 03/08/2023 0449   CREATININE 0.75 02/25/2023 0820   CALCIUM 9.1 03/08/2023 0449   GFRNONAA >60 03/08/2023 0449   GFRNONAA >60 02/25/2023 0820   GFRAA >60 10/19/2016 1335    COAGS: Lab Results  Component Value Date   INR 1.3 (H) 03/07/2023   INR 2.9 (H) 10/01/2022   INR 2.8 (H) 09/30/2022     Non-Invasive Vascular Imaging:   EXAM:03/07/23 BILATERAL LOWER EXTREMITY VENOUS DOPPLER ULTRASOUND   TECHNIQUE: Gray-scale sonography with graded compression, as well as color Doppler and duplex ultrasound were performed to evaluate the lower extremity deep venous systems from the level of the common femoral vein and including the common femoral, femoral, profunda femoral, popliteal and calf veins including the posterior tibial, peroneal and gastrocnemius veins when visible. The superficial great saphenous vein was also interrogated. Spectral Doppler was utilized to evaluate flow at rest and with distal augmentation maneuvers in the common femoral, femoral and popliteal veins.   COMPARISON:  11/21/2016   FINDINGS: RIGHT LOWER  EXTREMITY   Common Femoral Vein: No evidence of thrombus. Normal compressibility,  respiratory phasicity and response to augmentation.   Saphenofemoral Junction: No evidence of thrombus. Normal compressibility and flow on color Doppler imaging.   Profunda Femoral Vein: No evidence of thrombus. Normal compressibility and flow on color Doppler imaging.   Femoral Vein: No evidence of thrombus. Normal compressibility, respiratory phasicity and response to augmentation.   Popliteal Vein: Above the knee amputation.   Calf Veins: Above the knee amputation.   LEFT LOWER EXTREMITY   Common Femoral Vein: No evidence of thrombus. Normal compressibility, respiratory phasicity and response to augmentation.   Saphenofemoral Junction: No evidence of thrombus. Normal compressibility and flow on color Doppler imaging.   Profunda Femoral Vein: Occlusive thrombus.   Femoral Vein: Occlusive thrombus.   Popliteal Vein: Above the knee amputation.   Calf Veins: Above the knee amputation.   IMPRESSION: 1. Positive for DVT with occlusive thrombus in the left femoral and profundus femoral vein. 2. No DVT in the right lower extremity.  Statin:  Yes.   Beta Blocker:  No. Aspirin:  No. ACEI:  Yes.   ARB:  No. CCB use:  No Other antiplatelets/anticoagulants:  Yes.   Eliquis  5 mg BID    ASSESSMENT/PLAN: This is a 68 y.o. male who has medical history significant for stage IV prostate cancer with metastasis to his bones.  Patient presents to [facility] emergency room room with an acute onset of worsening left stump swelling, warmth, tenderness and pain.  Patient has a history of bilateral lower AKA's.  In reviewing the patient's ultrasound he has occlusive left femoral profunda thrombus which is normal post AKA.  He does however have a common femoral vein that does not have a thrombus.  He is currently anticoagulated on Eliquis  5 mg twice daily.  Vascular surgery recommends that the patient be taken off of his Eliquis  and placed on Lovenox  shots weight-based twice daily, due to his  significant cancer diagnosis with metastasis.  At this time there is no need for any intervention.  We also recommend that the patient elevate his left AKA stump as much as possible to help alleviate any lymphedema.  We also recommend that the patient wrap his left lower stump with a compressive type of dressing to help with the swelling and erythema.  He can use a's stump shrinker or we recommend an Ace bandage wrapped snugly.  Patient was informed elevation and compression as well as the use of Lovenox  may help alleviate some of his symptoms.  As for his left hip pain this may be related to his metastatic prostate cancer to this area.   -I discussed in detail the plan with Dr Gaile New MD and he agrees with the plan.    Gwendlyn JONELLE [facility]ank [facility] 03/08/2023 9:25 AM  I agree with the above.  This sounds like a anticoagulation failure.  In a patient with metastatic prostate cancer, I would recommend consultation with hematology and changing him to Lovenox .  As long as he tolerates Lovenox , I do not see a need for IVC filter.  The patient states that he does not want to have another filter placed.  He has had 2 placed previously for prior surgeries.  Malvina New

## 2023-03-08 NOTE — Progress Notes (Signed)
 PHARMACY - ANTICOAGULATION CONSULT NOTE  Pharmacy Consult for Enoxaparin  Indication: DVT  Allergies  Allergen Reactions   Bacitracin Hives   Vancomycin  Other (See Comments) and Rash    Red man syndrome.   Ketorolac     Other Reaction(s): Kidney Disorder  Other reaction(s): Kidney Disorder   Spironolactone     Other Reaction(s): Unknown  Other reaction(s): Unknown    Patient Measurements: Height: 4' 10 (147.3 cm) Weight: 130 kg (286 lb 9.6 oz) IBW/kg (Calculated) : 45.4 Heparin  Dosing Weight: 78.7 kg  Vital Signs: Temp: 98.4 F (36.9 C) (01/03 0754) Temp Source: Oral (01/03 0754) BP: 110/59 (01/03 1333) Pulse Rate: 81 (01/03 1333)  Labs: Recent Labs    03/06/23 1911 03/06/23 2157 03/07/23 1521 03/07/23 2334 03/08/23 0449 03/08/23 0815  HGB 9.6*  --  9.5*  --  8.8*  --   HCT 29.6*  --  28.1*  --  26.7*  --   PLT 168  --  155  --  135*  --   APTT  --   --   --  33  --  82*  LABPROT  --   --   --  16.1*  --   --   INR  --   --   --  1.3*  --   --   HEPARINUNFRC  --   --   --  0.61  --  >1.10*  CREATININE 0.61  --  0.59*  --  0.58*  --   TROPONINIHS 7 8  --   --   --   --     Estimated Creatinine Clearance: 100.4 mL/min (A) (by C-G formula based on SCr of 0.58 mg/dL (L)).   Medical History: Past Medical History:  Diagnosis Date   Depression    History of blood clots 08/13/2011   History of left knee replacement    HTN (hypertension)    Lymphedema    lt leg-per pt   PE (pulmonary thromboembolism) (HCC)    Pressure injury of skin, unspecified injury stage, unspecified location    S/P AKA (above knee amputation) bilateral (HCC)    rt 11/24/07 and lt 01/11/17    Medications:  (Not in a hospital admission)   Assessment: Patient is a 68 year old male with a past medical history of depression, hypertension, PE and osteoarthritis, as well as stage IV prostate cancer status post radiotherapy, on hormonal therapy. 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  CrCl = 100.4 ml/min   Goal of Therapy:  Anti-Xa level 0.6-1 units/ml 4hrs after LMWH dose given- no routinely monitored Monitor platelets by anticoagulation protocol: Yes   Plan:  Start enoxaparin  130 mg SQ q12h Continue to monitor CBC q72h   Lum VEAR Mania, PharmD Clinical Pharmacist 03/08/2023,1:47 PM

## 2023-03-08 NOTE — Assessment & Plan Note (Addendum)
 Recurrent DVT and also history of PE despite saying on Eliquis .  Likely hypercoagulable state with underlying active malignancy. Vascular surgery was consulted but no vascular intervention needed at this time and patient will remain high risk for thromboembolism. -Initially started on heparin  infusion which is being transitioned to Lovenox  now. -Patient will be discharging home tomorrow on Lovenox  instead of Eliquis 

## 2023-03-08 NOTE — Progress Notes (Signed)
 PHARMACY - ANTICOAGULATION CONSULT NOTE  Pharmacy Consult for Heparin   Indication: DVT  Allergies  Allergen Reactions   Bacitracin Hives   Vancomycin  Other (See Comments) and Rash    Red man syndrome.   Ketorolac     Other Reaction(s): Kidney Disorder  Other reaction(s): Kidney Disorder   Spironolactone     Other Reaction(s): Unknown  Other reaction(s): Unknown    Patient Measurements: Height: 4' 10 (147.3 cm) Weight: 130 kg (286 lb 9.6 oz) IBW/kg (Calculated) : 45.4 Heparin  Dosing Weight: 78.7 kg  Vital Signs: Temp: 98.4 F (36.9 C) (01/03 0754) Temp Source: Oral (01/03 0754) BP: 119/65 (01/03 0754) Pulse Rate: 80 (01/03 0754)  Labs: Recent Labs    03/06/23 1911 03/06/23 2157 03/07/23 1521 03/07/23 2334 03/08/23 0449 03/08/23 0815  HGB 9.6*  --  9.5*  --  8.8*  --   HCT 29.6*  --  28.1*  --  26.7*  --   PLT 168  --  155  --  135*  --   APTT  --   --   --  33  --  82*  LABPROT  --   --   --  16.1*  --   --   INR  --   --   --  1.3*  --   --   HEPARINUNFRC  --   --   --  0.61  --  >1.10*  CREATININE 0.61  --  0.59*  --  0.58*  --   TROPONINIHS 7 8  --   --   --   --     Estimated Creatinine Clearance: 100.4 mL/min (A) (by C-G formula based on SCr of 0.58 mg/dL (L)).   Medical History: Past Medical History:  Diagnosis Date   Depression    History of blood clots 08/13/2011   History of left knee replacement    HTN (hypertension)    Lymphedema    lt leg-per pt   PE (pulmonary thromboembolism) (HCC)    Pressure injury of skin, unspecified injury stage, unspecified location    S/P AKA (above knee amputation) bilateral (HCC)    rt 11/24/07 and lt 01/11/17    Medications:  (Not in a hospital admission)   Assessment: Pharmacy consulted to dose heparin  in this 68 year old male with DVT.  Pt was on Eliquis  5 mg PO BID PTA, last dose on 01/01 AM.    Date/Time aPTT/HL Rate  Comment 1/3 0815 82/>1.1 1350 units/hr Therapeutic x 1  CrCl = 100.4 ml/min    Goal of Therapy:  aPTT:  66 - 102  Heparin  level 0.3-0.7 units/ml Monitor platelets by anticoagulation protocol: Yes   Plan:  aPTT level therapeutic this morning Heparin  level not correlating yet, will continue monitoring via aPTT levels Continue heparin  at current rate of 1350 units/hr Check aPTT level in 6 hours Continue to monitor CBC and heparin  levels daily   Lum VEAR Mania, PharmD Clinical Pharmacist 03/08/2023,9:40 AM

## 2023-03-09 ENCOUNTER — Observation Stay: Payer: Medicare PPO

## 2023-03-09 DIAGNOSIS — I10 Essential (primary) hypertension: Secondary | ICD-10-CM | POA: Diagnosis not present

## 2023-03-09 DIAGNOSIS — C61 Malignant neoplasm of prostate: Secondary | ICD-10-CM | POA: Diagnosis not present

## 2023-03-09 DIAGNOSIS — C7951 Secondary malignant neoplasm of bone: Secondary | ICD-10-CM | POA: Diagnosis not present

## 2023-03-09 DIAGNOSIS — E876 Hypokalemia: Secondary | ICD-10-CM

## 2023-03-09 DIAGNOSIS — F32A Depression, unspecified: Secondary | ICD-10-CM | POA: Diagnosis not present

## 2023-03-09 LAB — BASIC METABOLIC PANEL
Anion gap: 8 (ref 5–15)
BUN: 12 mg/dL (ref 8–23)
CO2: 25 mmol/L (ref 22–32)
Calcium: 9.2 mg/dL (ref 8.9–10.3)
Chloride: 104 mmol/L (ref 98–111)
Creatinine, Ser: 0.7 mg/dL (ref 0.61–1.24)
GFR, Estimated: >60 mL/min (ref >60)
Glucose, Bld: 103 mg/dL — ABNORMAL HIGH (ref 70–99)
Potassium: 3.1 mmol/L — ABNORMAL LOW (ref 3.5–5.1)
Sodium: 137 mmol/L (ref 135–145)

## 2023-03-09 LAB — GASTROINTESTINAL PANEL BY PCR, STOOL (REPLACES STOOL CULTURE)

## 2023-03-09 LAB — CBC
HCT: 29.3 % — ABNORMAL LOW (ref 39.0–52.0)
Hemoglobin: 9.5 g/dL — ABNORMAL LOW (ref 13.0–17.0)
MCH: 33.6 pg (ref 26.0–34.0)
MCHC: 32.4 g/dL (ref 30.0–36.0)
MCV: 103.5 fL — ABNORMAL HIGH (ref 80.0–100.0)
Platelets: 135 10*3/uL — ABNORMAL LOW (ref 150–400)
RBC: 2.83 MIL/uL — ABNORMAL LOW (ref 4.22–5.81)
RDW: 15.3 % (ref 11.5–15.5)
WBC: 3 10*3/uL — ABNORMAL LOW (ref 4.0–10.5)
nRBC: 0 % (ref 0.0–0.2)

## 2023-03-09 LAB — MAGNESIUM: Magnesium: 1.9 mg/dL (ref 1.7–2.4)

## 2023-03-09 LAB — C DIFFICILE QUICK SCREEN W PCR REFLEX
C Diff antigen: NEGATIVE
C Diff interpretation: NOT DETECTED
C Diff toxin: NEGATIVE

## 2023-03-09 MED ORDER — POTASSIUM CHLORIDE CRYS ER 20 MEQ PO TBCR
40.0000 meq | EXTENDED_RELEASE_TABLET | ORAL | Status: AC
  Administered 2023-03-09 (×2): 40 meq via ORAL
  Filled 2023-03-09: qty 2

## 2023-03-09 MED ORDER — OXYCODONE-ACETAMINOPHEN 5-325 MG PO TABS
1.0000 | ORAL_TABLET | Freq: Four times a day (QID) | ORAL | Status: DC | PRN
  Administered 2023-03-09 – 2023-03-11 (×2): 2 via ORAL
  Filled 2023-03-09 (×2): qty 2

## 2023-03-09 NOTE — Plan of Care (Signed)
  Problem: Education: Goal: Knowledge of General Education information will improve Description: Including pain rating scale, medication(s)/side effects and non-pharmacologic comfort measures Outcome: Progressing   Problem: Clinical Measurements: Goal: Ability to maintain clinical measurements within normal limits will improve Outcome: Progressing Goal: Will remain free from infection Outcome: Progressing Goal: Diagnostic test results will improve Outcome: Progressing Goal: Respiratory complications will improve Outcome: Progressing Goal: Cardiovascular complication will be avoided Outcome: Progressing   Problem: Activity: Goal: Risk for activity intolerance will decrease Outcome: Progressing   Problem: Pain Management: Goal: General experience of comfort will improve Outcome: Progressing   Problem: Safety: Goal: Ability to remain free from injury will improve Outcome: Progressing

## 2023-03-09 NOTE — Plan of Care (Signed)
  Problem: Education: Goal: Knowledge of General Education information will improve Description: Including pain rating scale, medication(s)/side effects and non-pharmacologic comfort measures Outcome: Progressing   Problem: Clinical Measurements: Goal: Ability to maintain clinical measurements within normal limits will improve Outcome: Progressing   Problem: Activity: Goal: Risk for activity intolerance will decrease Outcome: Progressing   Problem: Nutrition: Goal: Adequate nutrition will be maintained Outcome: Progressing   Problem: Coping: Goal: Level of anxiety will decrease Outcome: Progressing   Problem: Elimination: Goal: Will not experience complications related to bowel motility Outcome: Progressing   Problem: Pain Management: Goal: General experience of comfort will improve Outcome: Progressing   Problem: Safety: Goal: Ability to remain free from injury will improve Outcome: Progressing   Problem: Skin Integrity: Goal: Risk for impaired skin integrity will decrease Outcome: Progressing

## 2023-03-09 NOTE — Progress Notes (Signed)
 Progress Note   Patient: [name] FMW:[id number] DOB: [birth year] DOA: 03/07/2023     1 DOS: the patient was seen and examined on 03/09/2023   Brief hospital course: Taken from H&P.  [name] is a 68 y.o. 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  He denied any fever or chills.  He continues to take his Eliquis  regularly.   On presentation vitals stable, labs with potassium 3.4, CO2 19, T. bili 2.2, hemoglobin 9.5, close to baseline with macrocytosis, INR 1.3. EKG with NSR and first-degree heart block, low voltage and Q waves inferiorly Bilateral lower extremity venous Doppler showed DVT with occlusive thrombus in the left femoral and profundus femoral vein with no DVT in the right lower extremity.  CTA was negative for PE, did show her dilated ascending aorta at 4.3 cm which will need follow-up.  Changes consistent with prostatic metastatic  disease in the bony structures.  Also suspect patient of left subclavian narrowing with heavy collaterals around the left chest wall.  1/3 : Vital stable, labs with macrocytic anemia with hemoglobin of 8.8, platelet decreased to 135 from 155.  Vascular surgery was also consulted to evaluate for any surgical intervention and IVC filter placement, apparently due to his underlying hypercoagulable state with active malignancy he is not a candidate for any surgical intervention.  He will be high risk for IVC thrombosis with IVC filter.  Risk and benefits were discussed with patient and he decided not to do any intervention at this time.  Initially received heparin  infusion and now being transitioned to Lovenox .  1/4: Vital stable, continue to have diarrhea so C. difficile and GI  pathogen panel was checked and they were negative.  Complaining of right hip pain with each movement, imaging ordered.  Would like to spend another night so he can become little more stronger and improvement of diarrhea before going home.   Assessment and Plan: * Leg DVT (deep venous thromboembolism), acute, left (HCC) Recurrent DVT and also history of PE despite saying on Eliquis .  Likely hypercoagulable state with underlying active malignancy. Vascular surgery was consulted but no vascular intervention needed at this time and patient will remain high risk for thromboembolism. -Initially started on heparin  infusion which is being transitioned to Lovenox  now. -Patient will be discharging home tomorrow on Lovenox  instead of Eliquis   Prostate cancer metastatic to bone [place]) - We will continue his Nubeqa  hormonal therapy. - Pain management will be provided.  Depression - Continue Wellbutrin  XL.  Essential hypertension - We will continue home antihypertensive therapy.  Diarrhea Patient is experiencing diarrhea since Thursday night per patient. No leukocytosis.  C. difficile and GI pathogen panel was checked today and they were negative. -Supportive care -Imodium  was ordered  Hypokalemia Potassium at 3.1, likely due to GI losses with diarrhea.  Magnesium  normal. -Replace potassium and monitor  Peripheral neuropathy - Continue Neurontin .   Subjective: Patient was complaining of right hip pain with each time he was turned and asking to do an imaging which was ordered.  Continue to have some diarrhea and would like to stay 1 more night to see some improvement.  Physical Exam: Vitals:   03/08/23 1745 03/08/23 1948 03/09/23 0412 03/09/23 0745  BP: 123/67 (!) 90/54 110/66 123/72  Pulse: 73  69 69 69  Resp: 16 18 18 20   Temp: 98 F (36.7 C) 98.7 F (37.1 C) 97.7 F (36.5 C) 98.2 F (36.8 C)  TempSrc:    Oral  SpO2: 98% 98% 99% 100%  Weight:      Height:       General.  Morbidly  obese gentleman, in no acute distress. Pulmonary.  Lungs clear bilaterally, normal respiratory effort. CV.  Regular rate and rhythm, no JVD, rub or murmur. Abdomen.  Soft, nontender, nondistended, BS positive. CNS.  Alert and oriented .  No focal neurologic deficit. Extremities.  Bilateral AKA Psychiatry.  Judgment and insight appears normal.   Data Reviewed: Prior data reviewed  Family Communication: Discussed with patient  Disposition: Status is: Observation The patient remains OBS appropriate and will d/c before 2 midnights.  Planned Discharge Destination: Home with Home Health   Time spent: 44 minutes  This record has been created using Conservation officer, historic buildings. Errors have been sought and corrected,but may not always be located. Such creation errors do not reflect on the standard of care.   Author: Amaryllis Dare, MD 03/09/2023 3:21 PM  For on call review [email or web address].

## 2023-03-09 NOTE — Assessment & Plan Note (Addendum)
 Potassium at 3.3, likely due to GI losses with diarrhea.  Magnesium normal. -Replace potassium and monitor

## 2023-03-09 NOTE — Assessment & Plan Note (Addendum)
 Patient is experiencing diarrhea since Thursday night per patient. No leukocytosis.  C. difficile and GI pathogen panel  were negative. -Supportive care -Adding Lomotil as Imodium was not very helpful

## 2023-03-09 NOTE — TOC Progression Note (Signed)
 Transition of Care [facility]) - Progression Note    Patient Details  Name: [name] MRN: [id number] Date of Birth: February 02, 1956  Transition of Care [facility]) CM/SW Contact  Lyliana Dicenso E Foye Damron, LCSW Phone Number: 03/09/2023, 1:07 PM  Clinical Narrative:    Met with patient at bedside, reviewed code 39 again. Patient confirmed he has the copy that was provided to him yesterday. Patient signed physical copy of code 77 letter which CSW placed in patient's hard chart.  Informed patient that we have updated [facility] of him being at the hospital and will let them know when he is discharged so that they can follow for [facility].   Expected Discharge Plan: Home w Home Health Services Barriers to Discharge: Continued Medical Work up  Expected Discharge Plan and Services       Living arrangements for the past 2 months: Single Family Home                           HH Arranged: RN [facility] Agency: [facility] Date [facility] Agency Contacted: 03/08/23   Representative spoke with at [facility] Agency: [place]   Social Determinants of Health (SDOH) Interventions SDOH Screenings   Food Insecurity: No Food Insecurity (03/09/2023)  Housing: Low Risk  (03/09/2023)  Transportation Needs: No Transportation Needs (03/09/2023)  Utilities: Not At Risk (03/09/2023)  Financial Resource Strain: High Risk (07/28/2021)   Received from [facility], [facility]  Physical Activity: Sufficiently Active (07/28/2021)   Received from [facility], [facility]  Social Connections: Socially Isolated (08/01/2021)   Received from [name], [facility]  Stress: Stress Concern Present (08/01/2021)   Received from [facility], [facility]  Tobacco Use: Medium Risk (03/07/2023)    Readmission Risk Interventions     No data to display

## 2023-03-09 NOTE — Assessment & Plan Note (Signed)
 Recurrent DVT and also history of PE despite saying on Eliquis .  Likely hypercoagulable state with underlying active malignancy. Vascular surgery was consulted but no vascular intervention needed at this time and patient will remain high risk for thromboembolism. -Initially started on heparin  infusion which is being transitioned to Lovenox  now. -Patient will be discharging home tomorrow on Lovenox  instead of Eliquis 

## 2023-03-10 DIAGNOSIS — F32A Depression, unspecified: Secondary | ICD-10-CM | POA: Diagnosis not present

## 2023-03-10 DIAGNOSIS — I10 Essential (primary) hypertension: Secondary | ICD-10-CM | POA: Diagnosis not present

## 2023-03-10 DIAGNOSIS — C61 Malignant neoplasm of prostate: Secondary | ICD-10-CM | POA: Diagnosis not present

## 2023-03-10 DIAGNOSIS — C7951 Secondary malignant neoplasm of bone: Secondary | ICD-10-CM | POA: Diagnosis not present

## 2023-03-10 LAB — BASIC METABOLIC PANEL
Anion gap: 8 (ref 5–15)
BUN: 12 mg/dL (ref 8–23)
CO2: 24 mmol/L (ref 22–32)
Calcium: 9.1 mg/dL (ref 8.9–10.3)
Chloride: 107 mmol/L (ref 98–111)
Creatinine, Ser: 0.73 mg/dL (ref 0.61–1.24)
GFR, Estimated: >60 mL/min (ref >60)
Glucose, Bld: 90 mg/dL (ref 70–99)
Potassium: 3.3 mmol/L — ABNORMAL LOW (ref 3.5–5.1)
Sodium: 139 mmol/L (ref 135–145)

## 2023-03-10 MED ORDER — DIPHENOXYLATE-ATROPINE 2.5-0.025 MG PO TABS
1.0000 | ORAL_TABLET | Freq: Four times a day (QID) | ORAL | Status: DC
  Administered 2023-03-10 – 2023-03-11 (×5): 1 via ORAL
  Filled 2023-03-10 (×5): qty 1

## 2023-03-10 MED ORDER — POTASSIUM CHLORIDE CRYS ER 20 MEQ PO TBCR
40.0000 meq | EXTENDED_RELEASE_TABLET | Freq: Once | ORAL | Status: AC
  Administered 2023-03-10: 40 meq via ORAL
  Filled 2023-03-10: qty 2

## 2023-03-10 NOTE — Progress Notes (Signed)
 Progress Note   Patient: [name] FMW:[id number] DOB: 07/22/1955 DOA: 03/07/2023     1 DOS: the patient was seen and examined on 03/10/2023   Brief hospital course: Taken from H&P.  [name] is a 68 y.o. 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  He denied any fever or chills.  He continues to take his Eliquis  regularly.   On presentation vitals stable, labs with potassium 3.4, CO2 19, T. bili 2.2, hemoglobin 9.5, close to baseline with macrocytosis, INR 1.3. EKG with NSR and first-degree heart block, low voltage and Q waves inferiorly Bilateral lower extremity venous Doppler showed DVT with occlusive thrombus in the left femoral and profundus femoral vein with no DVT in the right lower extremity.  CTA was negative for PE, did show her dilated ascending aorta at 4.3 cm which will need follow-up.  Changes consistent with prostatic metastatic  disease in the bony structures.  Also suspect patient of left subclavian narrowing with heavy collaterals around the left chest wall.  1/3 : Vital stable, labs with macrocytic anemia with hemoglobin of 8.8, platelet decreased to 135 from 155.  Vascular surgery was also consulted to evaluate for any surgical intervention and IVC filter placement, apparently due to his underlying hypercoagulable state with active malignancy he is not a candidate for any surgical intervention.  He will be high risk for IVC thrombosis with IVC filter.  Risk and benefits were discussed with patient and he decided not to do any intervention at this time.  Initially received heparin  infusion and now being transitioned to Lovenox .  1/4: Vital stable, continue to have diarrhea so C. difficile and GI  pathogen panel was checked and they were negative.  Complaining of right hip pain with each movement, imaging ordered.  Would like to spend another night so he can become little more stronger and improvement of diarrhea before going home.  1/5: Hemodynamically stable, continue to have diarrhea.  Asking for PT evaluation for right hip pain.  Does not think that he can go home to take care of himself and would like this diarrhea to improved before leaving.  Added Lomotil  as Imodium  was not very helpful.  Patient will also need EMS transfer to home.   Assessment and Plan: * Leg DVT (deep venous thromboembolism), acute, left (HCC) Recurrent DVT and also history of PE despite saying on Eliquis .  Likely hypercoagulable state with underlying active malignancy. Vascular surgery was consulted but no vascular intervention needed at this time and patient will remain high risk for thromboembolism. -Initially started on heparin  infusion which is being transitioned to Lovenox  now. -Patient will be discharging home tomorrow on Lovenox  instead of Eliquis   Prostate cancer metastatic to bone [facility]) - We will continue his Nubeqa  hormonal therapy. - Pain management will be provided.  Depression - Continue Wellbutrin  XL.  Essential hypertension - We will continue home antihypertensive therapy.  Diarrhea Patient is experiencing diarrhea since Thursday night per patient. No leukocytosis.  C. difficile and GI pathogen panel  were negative. -Supportive care -Adding Lomotil  as Imodium  was not very helpful  Hypokalemia Potassium at 3.3, likely due to GI losses with diarrhea.  Magnesium  normal. -Replace potassium and monitor  Peripheral neuropathy - Continue Neurontin .   Subjective: Patient continued to have right  hip pain and diarrhea.  Does not think that he can go home today as will not be having some help.  Still does not want to go to rehab.  He was also asking for EMS transfer.  Physical  Exam: Vitals:   03/09/23 0745 03/09/23 1936 03/10/23 0423 03/10/23 0749  BP: 123/72 (!) 87/54 111/65 118/74  Pulse: 69 70 68 69  Resp: 20 19 18 20   Temp: 98.2 F (36.8 C) 98 F (36.7 C) 98.3 F (36.8 C) 98.2 F (36.8 C)  TempSrc: Oral  Oral Oral  SpO2: 100% 99% 100% 97%  Weight:      Height:       General.  Morbidly obese gentleman, in no acute distress. Pulmonary.  Lungs clear bilaterally, normal respiratory effort. CV.  Regular rate and rhythm, no JVD, rub or murmur. Abdomen.  Soft, nontender, nondistended, BS positive. CNS.  Alert and oriented .  No focal neurologic deficit. Extremities.  Bilateral AKA Psychiatry.  Judgment and insight appears normal.   Data Reviewed: Prior data reviewed  Family Communication: Discussed with patient  Disposition: Status is: Observation The patient remains OBS appropriate and will d/c before 2 midnights.  Planned Discharge Destination: Home with Home Health   Time spent: 42 minutes  This record has been created using Conservation officer, historic buildings. Errors have been sought and corrected,but may not always be located. Such creation errors do not reflect on the standard of care.   Author: Amaryllis Dare, MD 03/10/2023 1:24 PM  For on call review [email or web address].

## 2023-03-10 NOTE — TOC Progression Note (Signed)
 Transition of Care [facility]) - Progression Note    Patient Details  Name: Cartrell Bentsen MRN: [id number] Date of Birth: 1955/06/01  Transition of Care [facility]) CM/SW Contact  Clotilda Brenner, [place] Phone Number: 03/10/2023, 2:14 PM  Clinical Narrative:   CSW spoke with patient about needing a hospital bed at discharge. CSW asked patient if someone would be at home when the bed gets delivered. Patient states he might buy his own bed and have some friends put it together. CSW told patient that she can go ahead and contact [employer] and give them a heads up so it would be covered under his insurance. Patient did not want to make any decisions today about the hospital bed and wants someone to call him tomorrow morning to discuss further.     Expected Discharge Plan: Home w Home Health Services Barriers to Discharge: Continued Medical Work up  Expected Discharge Plan and Services       Living arrangements for the past 2 months: Single Family Home                           HH Arranged: RN [facility] Agency: [facility] Date [facility] Agency Contacted: 03/08/23   Representative spoke with at [facility] Agency: [place]   Social Determinants of Health (SDOH) Interventions SDOH Screenings   Food Insecurity: No Food Insecurity (03/09/2023)  Housing: Low Risk  (03/09/2023)  Transportation Needs: No Transportation Needs (03/09/2023)  Utilities: Not At Risk (03/09/2023)  Financial Resource Strain: High Risk (07/28/2021)   Received from [facility], [facility]  Physical Activity: Sufficiently Active (07/28/2021)   Received from [facility], [facility]  Social Connections: Socially Isolated (08/01/2021)   Received from [facility], [facility]  Stress: Stress Concern Present (08/01/2021)   Received from [facility], [facility]  Tobacco Use: Medium Risk (03/07/2023)    Readmission  Risk Interventions     No data to display

## 2023-03-10 NOTE — Care Management Obs Status (Signed)
 MEDICARE OBSERVATION STATUS NOTIFICATION   Patient Details  Name: [name] MRN: [id number] Date of Birth: October 21, 1955   Medicare Observation Status Notification Given:  Yes  Patient agreed and signed MOON notification     Clotilda Brenner, LCSWA 03/10/2023, 1:55 PM

## 2023-03-10 NOTE — Discharge Instructions (Signed)
Private Pay Resources  [facility] Address: [address], [place], [place] [address] Phone: [phone]  [facility] Address: [address], [place], [place] [address] Phone: [phone]  [facility] Address: [address], [place], [place] [address] Phone: [phone]  [facility] Address: [address], , [place] [address] Phone: [phone]  [facility] Address: [address], [place], [place] [address] Phone: [phone]  [facility] Phone: [phone]  [facility] Address:  [address], [place], [place] [address] Phone:  [phone]  [facility] Address:  [address] Phone:  [phone]  [email or web address]  [facility] [place] Phone: [phone]   

## 2023-03-10 NOTE — Progress Notes (Signed)
 Physical Therapy Evaluation Patient Details Name: [name] MRN: [id number] DOB: 10/03/1955 Today's Date: 03/10/2023  History of Present Illness  Pt is a 68 y/o male admitted secondary to acute onset of worsening left residual limb swelling with associated warmth, erythema, tenderness and pain. Pt found to have an acute DVT. 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   Clinical Impression  Pt presented supine in bed with HOB elevated, awake and willing to participate in therapy session. Prior to admission, pt reported that he was functioning at a modified independent level with all mobility and ADLs with use of a manual or power wheelchair (w/c). He is able to get his groceries delivered and a friend is able to drive him anywhere he needs to go (I.e. - doctor's appointments, cancer treatments, etc.). Pt lives alone with his dog [name]) in a completely handicap accessible home with a lift system for steps to enter and to access the second floor. He reported that he does not have any local family but has some friends that are able to assist him if needed. At the time of evaluation, pt very limited with mobility secondary to pain and weakness. He was only able to partially roll towards his left side with use of bed rails. Pt reporting that when he has utilized the bed pan, he requires two people to assist him with rolling bilaterally. He also endorses increased pain in his right groin area with movement. At the time, pt is adamant about returning home vs short-term rehab; however, he will require a hospital bed (with overhead trapeze) and a BSC to be delivered to his home prior to him d/c'ing home. He will also require initial 24/7 assistance/supervision from friend(s) upon d/c home, as well as HHPT, HHOT and HHRN. Pt would continue to benefit from skilled physical therapy services at this  time while admitted and after d/c to address the below listed limitations in order to improve overall safety and independence with functional mobility.       If plan is discharge home, recommend the following: A lot of help with walking and/or transfers;A lot of help with bathing/dressing/bathroom;Assistance with cooking/housework;Help with stairs or ramp for entrance;Assist for transportation   Can travel by private vehicle        Equipment Recommendations BSC/3in1;Hospital bed;Other (comment) (hospital bed with overhead trapeze bar)  Recommendations for Other Services       Functional Status Assessment Patient has had a recent decline in their functional status and demonstrates the ability to make significant improvements in function in a reasonable and predictable amount of time.     Precautions / Restrictions Precautions Precautions: Fall Precaution Comments: hx of bilateral transfemoral amputations, no prostheses Restrictions Weight Bearing Restrictions Per Provider Order: No      Mobility  Bed Mobility Overal bed mobility: Needs Assistance Bed Mobility: Rolling Rolling: Max assist, Used rails         General bed mobility comments: pt only able to partially roll without physical assistance with use of bed rails, heavy physical assistance required to complete a roll to sidelying in either direction. Pt with reports of pain in R groin area with movement    Transfers                        Ambulation/Gait  Stairs            Wheelchair Mobility     Tilt Bed    Modified Rankin (Stroke Patients Only)       Balance                                             Pertinent Vitals/Pain Pain Assessment Pain Assessment: No/denies pain    Home Living Family/patient expects to be discharged to:: Private residence Living Arrangements: Alone Available Help at Discharge: Friend(s);Available  PRN/intermittently Type of Home: House Home Access: Ramped entrance;Other (comment) (lift system)     Alternate Level Stairs-Number of Steps: has stair lift chair Home Layout: Two level;Able to live on main level with bedroom/bathroom Home Equipment: Shower seat;Grab bars - toilet;Grab bars - tub/shower;Wheelchair - manual;Wheelchair - power Additional Comments: home is wheelchair accessible throughout    Prior Function Prior Level of Function : Independent/Modified Independent             Mobility Comments: w/c level, mod indep ADLs Comments: mod indep     Extremity/Trunk Assessment   Upper Extremity Assessment Upper Extremity Assessment: Overall WFL for tasks assessed    Lower Extremity Assessment Lower Extremity Assessment: Generalized weakness;RLE deficits/detail;LLE deficits/detail RLE Deficits / Details: hx of transfemoral amputation LLE Deficits / Details: hx of transfemoral amputation; pt with reports of pain with active hip flexion and hip adduction LLE: Unable to fully assess due to pain       Communication   Communication Communication: No apparent difficulties  Cognition Arousal: Alert Behavior During Therapy: WFL for tasks assessed/performed Overall Cognitive Status: Within Functional Limits for tasks assessed                                          General Comments      Exercises     Assessment/Plan    PT Assessment Patient needs continued PT services  PT Problem List Decreased strength;Decreased range of motion;Decreased activity tolerance;Decreased balance;Decreased mobility;Decreased coordination;Decreased knowledge of use of DME;Decreased safety awareness;Decreased knowledge of precautions;Pain       PT Treatment Interventions DME instruction;Gait training;Functional mobility training;Stair training;Therapeutic activities;Therapeutic exercise;Balance training;Neuromuscular re-education;Patient/family education    PT Goals  (Current goals can be found in the Care Plan section)  Acute Rehab PT Goals Patient Stated Goal: to return to his PLOF/mod independence PT Goal Formulation: With patient Time For Goal Achievement: 03/24/23 Potential to Achieve Goals: Good    Frequency Min 1X/week     Co-evaluation               AM-PAC PT 6 Clicks Mobility  Outcome Measure Help needed turning from your back to your side while in a flat bed without using bedrails?: A Lot Help needed moving from lying on your back to sitting on the side of a flat bed without using bedrails?: A Lot Help needed moving to and from a bed to a chair (including a wheelchair)?: Total Help needed standing up from a chair using your arms (e.g., wheelchair or bedside chair)?: Total Help needed to walk in hospital room?: Total Help needed climbing 3-5 steps with a railing? : Total 6 Click Score: 8    End of Session   Activity Tolerance: Patient tolerated treatment well;Patient limited by pain Patient  left: in bed;with call bell/phone within reach;with bed alarm set Nurse Communication: Mobility status PT Visit Diagnosis: Other abnormalities of gait and mobility (R26.89)    Time: 8850-8751 PT Time Calculation (min) (ACUTE ONLY): 59 min   Charges:   PT Evaluation $PT Eval Moderate Complexity: 1 Mod PT Treatments $Therapeutic Activity: 8-22 mins $Self Care/Home Management: 23-37 PT General Charges $$ ACUTE PT VISIT: 1 Visit         Delon DELENA KLEIN, DPT  Acute Rehabilitation Services Office [phone]   Delon HERO Demetri Kerman 03/10/2023, 1:12 PM

## 2023-03-11 ENCOUNTER — Other Ambulatory Visit: Payer: Self-pay | Admitting: *Deleted

## 2023-03-11 ENCOUNTER — Telehealth: Payer: Self-pay | Admitting: Internal Medicine

## 2023-03-11 DIAGNOSIS — R197 Diarrhea, unspecified: Secondary | ICD-10-CM

## 2023-03-11 DIAGNOSIS — G6289 Other specified polyneuropathies: Secondary | ICD-10-CM

## 2023-03-11 DIAGNOSIS — C61 Malignant neoplasm of prostate: Secondary | ICD-10-CM | POA: Diagnosis not present

## 2023-03-11 DIAGNOSIS — E876 Hypokalemia: Secondary | ICD-10-CM

## 2023-03-11 DIAGNOSIS — I824Y2 Acute embolism and thrombosis of unspecified deep veins of left proximal lower extremity: Secondary | ICD-10-CM | POA: Diagnosis not present

## 2023-03-11 DIAGNOSIS — I10 Essential (primary) hypertension: Secondary | ICD-10-CM | POA: Diagnosis not present

## 2023-03-11 DIAGNOSIS — F32A Depression, unspecified: Secondary | ICD-10-CM | POA: Diagnosis not present

## 2023-03-11 LAB — BASIC METABOLIC PANEL
Anion gap: 6 (ref 5–15)
BUN: 12 mg/dL (ref 8–23)
CO2: 27 mmol/L (ref 22–32)
Calcium: 9.1 mg/dL (ref 8.9–10.3)
Chloride: 108 mmol/L (ref 98–111)
Creatinine, Ser: 0.69 mg/dL (ref 0.61–1.24)
GFR, Estimated: >60 mL/min (ref >60)
Glucose, Bld: 98 mg/dL (ref 70–99)
Potassium: 3.3 mmol/L — ABNORMAL LOW (ref 3.5–5.1)
Sodium: 141 mmol/L (ref 135–145)

## 2023-03-11 MED ORDER — DIPHENOXYLATE-ATROPINE 2.5-0.025 MG PO TABS: 1.0000 | ORAL_TABLET | Freq: Four times a day (QID) | ORAL | 0 refills | Status: DC | PRN

## 2023-03-11 MED ORDER — ENOXAPARIN SODIUM 150 MG/ML IJ SOSY: 1.0000 mg/kg | PREFILLED_SYRINGE | Freq: Two times a day (BID) | INTRAMUSCULAR | 2 refills | Status: DC

## 2023-03-11 MED ORDER — MEDIHONEY WOUND/BURN DRESSING EX PSTE
1.0000 | PASTE | Freq: Every day | CUTANEOUS | Status: DC
  Filled 2023-03-11: qty 44

## 2023-03-11 MED ORDER — ENOXAPARIN SODIUM 150 MG/ML IJ SOSY: 130.0000 mg | PREFILLED_SYRINGE | Freq: Two times a day (BID) | INTRAMUSCULAR | 2 refills | Status: DC

## 2023-03-11 MED ORDER — ENOXAPARIN SODIUM 150 MG/ML IJ SOSY
130.0000 mg | PREFILLED_SYRINGE | Freq: Two times a day (BID) | INTRAMUSCULAR | Status: DC
  Filled 2023-03-11: qty 0.86

## 2023-03-11 MED ORDER — MEDIHONEY WOUND/BURN DRESSING EX PSTE: 1.0000 | PASTE | Freq: Every day | CUTANEOUS | 2 refills | Status: DC

## 2023-03-11 NOTE — Evaluation (Signed)
 Occupational Therapy Evaluation Patient Details Name: [name] MRN: [id number] DOB: Feb 21, 1956 Today's Date: 03/11/2023   History of Present Illness Pt is a 68 y/o male admitted secondary to acute onset of worsening left residual limb swelling with associated warmth, erythema, tenderness and pain. Pt found to have an acute DVT. 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   Clinical Impression   Pt was seen for OT evaluation this date and cotx with PT to optimize safety with ADL transfers. Prior to hospital admission, pt was modified with ADL transfers and ADL from w/c level at home where he lives with his dog. Pt eager to return to his dog. Pt presents to acute OT demonstrating impaired ADL performance and functional mobility 2/2 weakness, decreased balance, and activity tolerance (See OT problem list for additional functional deficits). Pt currently requires MOD A for LB ADL tasks from bed level or long sitting in bed, anticipate +2 assist required for transfer attempts.  Pt would benefit from skilled OT services to address noted impairments and functional limitations (see below for any additional details) in order to maximize safety and independence while minimizing falls risk and caregiver burden.      If plan is discharge home, recommend the following: Two people to help with walking and/or transfers;A lot of help with bathing/dressing/bathroom;Assistance with cooking/housework;Assist for transportation;Help with stairs or ramp for entrance    Functional Status Assessment  Patient has had a recent decline in their functional status and demonstrates the ability to make significant improvements in function in a reasonable and predictable amount of time.  Equipment Recommendations  Hospital bed    Recommendations for Other Services       Precautions / Restrictions  Precautions Precautions: Fall Precaution Comments: hx of bilateral transfemoral amputations, no prostheses here (too swollen to use anyways) Restrictions Weight Bearing Restrictions Per Provider Order: No      Mobility Bed Mobility Overal bed mobility: Needs Assistance Bed Mobility: Supine to Sit Rolling: Max assist, Used rails   Supine to sit: Supervision, HOB elevated, Used rails     General bed mobility comments: very heavy bed rail use, very difficult for pt to complete    Transfers                   General transfer comment: Pt attempted a scoot transfer to recilner but unable to complete after getting close to the EOB and having difficulty with mattress firmness making it very difficult      Balance Overall balance assessment: Needs assistance Sitting-balance support: No upper extremity supported, Feet unsupported Sitting balance-Leahy Scale: Fair                                     ADL either performed or assessed with clinical judgement   ADL Overall ADL's : Needs assistance/impaired                                       General ADL Comments: Pt currently requires at least MOD A for LB ADL from bed level involving rolling side to side, anticipate pt able to complete UB ADL without direct assist.     Vision         Perception  Praxis         Pertinent Vitals/Pain Pain Assessment Pain Assessment: Faces Faces Pain Scale: Hurts a little bit Pain Location: R groin Pain Descriptors / Indicators: Grimacing, Guarding, Aching Pain Intervention(s): Monitored during session, Repositioned     Extremity/Trunk Assessment Upper Extremity Assessment Upper Extremity Assessment: Overall WFL for tasks assessed   Lower Extremity Assessment Lower Extremity Assessment: Generalized weakness;RLE deficits/detail;LLE deficits/detail RLE Deficits / Details: hx of transfemoral amputation LLE Deficits / Details: hx of  transfemoral amputation       Communication Communication Communication: No apparent difficulties   Cognition Arousal: Alert Behavior During Therapy: WFL for tasks assessed/performed Overall Cognitive Status: Within Functional Limits for tasks assessed                                       General Comments       Exercises     Shoulder Instructions      Home Living Family/patient expects to be discharged to:: Private residence Living Arrangements: Alone Available Help at Discharge: Friend(s);Available PRN/intermittently Type of Home: House Home Access: Ramped entrance;Other (comment) (lift system)     Home Layout: Two level;Able to live on main level with bedroom/bathroom Alternate Level Stairs-Number of Steps: has stair lift chair   Bathroom Shower/Tub: Walk-in shower   Bathroom Toilet: Handicapped height     Home Equipment: Shower seat;Grab bars - toilet;Grab bars - tub/shower;Wheelchair - [employer] - power   Additional Comments: home is wheelchair accessible throughout      Prior Functioning/Environment Prior Level of Function : Independent/Modified Independent             Mobility Comments: w/c level, mod indep, has a gym in his garage ADLs Comments: mod indep        OT Problem List: Decreased strength;Decreased activity tolerance;Obesity;Impaired balance (sitting and/or standing)      OT Treatment/Interventions: Self-care/ADL training;Therapeutic exercise;Therapeutic activities;DME and/or AE instruction;Patient/family education;Balance training    OT Goals(Current goals can be found in the care plan section) Acute Rehab OT Goals Patient Stated Goal: get better and go home OT Goal Formulation: With patient Time For Goal Achievement: 03/25/23 Potential to Achieve Goals: Good ADL Goals Pt Will Perform Lower Body Dressing: with modified independence;sitting/lateral leans Pt Will Transfer to Toilet: with supervision;with  transfer board;bedside commode Pt Will Perform Toileting - Clothing Manipulation and hygiene: with modified independence;sitting/lateral leans  OT Frequency: Min 1X/week    Co-evaluation PT/OT/SLP Co-Evaluation/Treatment: Yes Reason for Co-Treatment: For patient/therapist safety;To address functional/ADL transfers PT goals addressed during session: Mobility/safety with mobility OT goals addressed during session: ADL's and self-care      AM-PAC OT 6 Clicks Daily Activity     Outcome Measure Help from another person eating meals?: None Help from another person taking care of personal grooming?: None Help from another person toileting, which includes using toliet, bedpan, or urinal?: Total Help from another person bathing (including washing, rinsing, drying)?: A Lot Help from another person to put on and taking off regular upper body clothing?: A Little Help from another person to put on and taking off regular lower body clothing?: A Lot 6 Click Score: 16   End of Session    Activity Tolerance: Patient tolerated treatment well Patient left: in bed;with call bell/phone within reach;with bed alarm set  OT Visit Diagnosis: Other abnormalities of gait and mobility (R26.89);Muscle weakness (generalized) (M62.81)  Time: 9093-9065 OT Time Calculation (min): 28 min Charges:  OT General Charges $OT Visit: 1 Visit OT Evaluation $OT Eval Moderate Complexity: 1 Mod  Warren SAUNDERS., MPH, MS, OTR/L ascom [phone] 03/11/23, 10:15 AM

## 2023-03-11 NOTE — Consult Note (Signed)
 WOC Nurse Consult Note: Reason for Consult: Consult requested for left posterior thigh/buttock Chronic Stage 3 pressure injury; 2X5X.2cm, 80% red, 20% yellow, small amt tan drainage.  Pressure Injury POA: Yes Dressing procedure/placement/frequency: Topical treatment orders provided for bedside nurses to perform as follows to assist with removal of nonviable tissue: Apply Medihoney to left posterior thigh/buttock Q day, then recover with foam dressing.  Change foam dressing Q 3 days or PRN soiling. Please re-consult if further assistance is needed.  Thank-you,  Stephane Fought MSN, RN, CWOCN, [place], CNS [phone]

## 2023-03-11 NOTE — Telephone Encounter (Signed)
 Given DVT on Eliquis I recommend follow-up   Follow up with  appointment with MD- on 1/10-before or after neurology appointment with Dr.vaslow-   MD- labs-cbc/bmp-  GB

## 2023-03-11 NOTE — Discharge Summary (Signed)
 Physician Discharge Summary   Patient: [name] MRN: [id number] DOB: 1955/12/25  Admit date:     03/07/2023  Discharge date: 03/11/23  Discharge Physician: Amaryllis Dare   PCP: Eliverto Bette Hover, MD   Recommendations at discharge:  Please obtain CBC and BMP on follow-up Patient is being discharged on Lovenox  due to recurrence of DVT despite being compliant with Eliquis  Follow-up with oncology Follow-up with primary care provider  Discharge Diagnoses: Principal Problem:   Leg DVT (deep venous thromboembolism), acute, left (HCC) Active Problems:   Prostate cancer metastatic to bone [facility])   Depression   Essential hypertension   Diarrhea   Peripheral neuropathy   Hypokalemia   Cellulitis of right leg   Hospital Course: Taken from H&P.  [name] is a 68 y.o. 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  He denied any fever or chills.  He continues to take his Eliquis  regularly.   On presentation vitals stable, labs with potassium 3.4, CO2 19, T. bili 2.2, hemoglobin 9.5, close to baseline with macrocytosis, INR 1.3. EKG with NSR and first-degree heart block, low voltage and Q waves inferiorly Bilateral lower extremity venous Doppler showed DVT with occlusive thrombus in the left femoral and profundus femoral vein with no DVT in the right lower extremity.  CTA was negative for PE, did show her dilated ascending aorta at 4.3 cm which will need follow-up.  Changes consistent with prostatic metastatic  disease in the bony structures.  Also suspect patient of left subclavian narrowing with heavy collaterals around the left chest wall.  1/3 : Vital stable, labs with macrocytic anemia with hemoglobin  of 8.8, platelet decreased to 135 from 155.  Vascular surgery was also consulted to evaluate for any surgical intervention and IVC filter placement, apparently due to his underlying hypercoagulable state with active malignancy he is not a candidate for any surgical intervention.  He will be high risk for IVC thrombosis with IVC filter.  Risk and benefits were discussed with patient and he decided not to do any intervention at this time.  Initially received heparin  infusion and now being transitioned to Lovenox .  1/4: Vital stable, continue to have diarrhea so C. difficile and GI pathogen panel was checked and they were negative.  Complaining of right hip pain with each movement, imaging ordered.  Would like to spend another night so he can become little more stronger and improvement of diarrhea before going home.  1/5: Hemodynamically stable, continue to have diarrhea.  Asking for PT evaluation for right hip pain.  Does not think that he can go home to take care of himself and would like this diarrhea to improved before leaving.  Added Lomotil  as Imodium  was not very helpful.  Patient will also need EMS transfer to home.  1/6: Remained hemodynamically stable.  Diarrhea seems improving with Lomotil , prescription was provided.  Hospital bed is being arranged by palliative care at cancer center.  Patient is being discharged on Lovenox  for DVT recurrence despite being on Eliquis .  Patient will continue the rest of his home medications.  Home health services were ordered as he does not want to go to rehab.  Patient need to follow-up with his providers for further assistance.  Assessment and Plan: * Leg DVT (deep venous  thromboembolism), acute, left (HCC) Recurrent DVT and also history of PE despite saying on Eliquis .  Likely hypercoagulable state with underlying active malignancy. Vascular surgery was consulted but no vascular intervention needed at this time and patient will remain high risk for  thromboembolism. -Initially started on heparin  infusion which is being transitioned to Lovenox  now. -Patient will be discharging home on Lovenox  instead of Eliquis   Prostate cancer metastatic to bone [facility]) - We will continue his Nubeqa  hormonal therapy. - Pain management will be provided.  Depression - Continue Wellbutrin  XL.  Essential hypertension - We will continue home antihypertensive therapy.  Diarrhea Patient is experiencing diarrhea since Thursday night per patient. No leukocytosis.  C. difficile and GI pathogen panel  were negative. -Supportive care -Adding Lomotil  as Imodium  was not very helpful  Hypokalemia Potassium at 3.3, likely due to GI losses with diarrhea.  Magnesium  normal. -Replace potassium and monitor  Peripheral neuropathy - Continue Neurontin .    Consultants: Vascular surgery Procedures performed: None Disposition: Home health Diet recommendation:  Discharge Diet Orders (From admission, onward)     Start     Ordered   03/11/23 0000  Diet - low sodium heart healthy        03/11/23 1021           Cardiac diet DISCHARGE MEDICATION: Allergies as of 03/11/2023       Reactions   Bacitracin Hives   Vancomycin  Other (See Comments), Rash   Red man syndrome.   Ketorolac    Other Reaction(s): Kidney Disorder Other reaction(s): Kidney Disorder   Spironolactone    Other Reaction(s): Unknown Other reaction(s): Unknown        Medication List     STOP taking these medications    apixaban  5 MG Tabs tablet Commonly known as: ELIQUIS    warfarin 5 MG tablet Commonly known as: COUMADIN        TAKE these medications    buPROPion  300 MG 24 hr tablet Commonly known as: WELLBUTRIN  XL Take 300 mg by mouth daily.   darolutamide  300 MG tablet Commonly known as: NUBEQA  Take 600 mg by mouth 2 (two) times daily with a meal.   diphenoxylate -atropine  2.5-0.025 MG tablet Commonly known as: LOMOTIL  Take 1 tablet by mouth 4 (four) times daily  as needed for diarrhea or loose stools.   enoxaparin  150 MG/ML injection Commonly known as: LOVENOX  Inject 0.86 mLs (130 mg total) into the skin every 12 (twelve) hours.   gabapentin  300 MG capsule Commonly known as: Neurontin  Take 2 capsules (600 mg total) by mouth 3 (three) times daily.   leptospermum manuka honey Pste paste Apply 1 Application topically daily.   lidocaine -prilocaine  cream Commonly known as: EMLA  Apply 1 Application topically as needed.   lisinopril  20 MG tablet Commonly known as: ZESTRIL  Take 20 mg by mouth 2 (two) times daily.   ondansetron  8 MG tablet Commonly known as: ZOFRAN  Take 1 tablet (8 mg total) by mouth every 8 (eight) hours as needed for nausea or vomiting.   oxyCODONE -acetaminophen  5-325 MG tablet Commonly known as: PERCOCET/ROXICET Take 1 tablet by mouth every 12 (twelve) hours as needed for moderate pain (pain score 4-6).   senna-docusate 8.6-50 MG tablet Commonly known as: Senokot-S Take 2 tablets by mouth 2 (two) times daily as needed for mild constipation.               Discharge Care Instructions  (From admission, onward)           Start  Ordered   03/11/23 0000  No dressing needed        03/11/23 1021            Follow-up Information     Eliverto Bette Hover, MD. Go on 03/19/2023.   Specialty: Family Medicine Why: arrive at 12:45 for 1:00 appt. Please bring photo and insurance info Contact information: [address] [place] [address] [phone]                Discharge Exam: Fredricka Weights   03/07/23 1519  Weight: 130 kg   General.  Morbidly obese gentleman, in no acute distress. Pulmonary.  Lungs clear bilaterally, normal respiratory effort. CV.  Regular rate and rhythm, no JVD, rub or murmur. Abdomen.  Soft, nontender, nondistended, BS positive. CNS.  Alert and oriented .  No focal neurologic deficit. Extremities.  Bilateral AKA  Condition at discharge: stable  The results of  significant diagnostics from this hospitalization (including imaging, microbiology, ancillary and laboratory) are listed below for reference.   Imaging Studies: DG HIP UNILAT WITH PELVIS 2-3 VIEWS RIGHT Result Date: 03/09/2023 CLINICAL DATA:  Pain EXAM: DG HIP (WITH OR WITHOUT PELVIS) 2-3V RIGHT COMPARISON:  CT abdomen pelvis dated 09/30/2022. FINDINGS: There is no evidence of hip fracture or dislocation. There is diffuse osseous demineralization. Mild osteoarthritis of both hips. Multiple sclerotic bony lesions are consistent with metastatic disease. A dilated loop of bowel in the lower right abdomen is partially imaged. IMPRESSION: 1. No acute osseous injury. 2. Dilated loop of bowel in the lower right abdomen is partially imaged. Electronically Signed   By: Norman Hopper M.D.   On: 03/09/2023 16:54   US  Venous Img Lower Bilateral Result Date: 03/07/2023 CLINICAL DATA:  Leg swelling.  Bilateral AKA. EXAM: BILATERAL LOWER EXTREMITY VENOUS DOPPLER ULTRASOUND TECHNIQUE: Gray-scale sonography with graded compression, as well as color Doppler and duplex ultrasound were performed to evaluate the lower extremity deep venous systems from the level of the common femoral vein and including the common femoral, femoral, profunda femoral, popliteal and calf veins including the posterior tibial, peroneal and gastrocnemius veins when visible. The superficial great saphenous vein was also interrogated. Spectral Doppler was utilized to evaluate flow at rest and with distal augmentation maneuvers in the common femoral, femoral and popliteal veins. COMPARISON:  11/21/2016 FINDINGS: RIGHT LOWER EXTREMITY Common Femoral Vein: No evidence of thrombus. Normal compressibility, respiratory phasicity and response to augmentation. Saphenofemoral Junction: No evidence of thrombus. Normal compressibility and flow on color Doppler imaging. Profunda Femoral Vein: No evidence of thrombus. Normal compressibility and flow on color Doppler  imaging. Femoral Vein: No evidence of thrombus. Normal compressibility, respiratory phasicity and response to augmentation. Popliteal Vein: Above the knee amputation. Calf Veins: Above the knee amputation. LEFT LOWER EXTREMITY Common Femoral Vein: No evidence of thrombus. Normal compressibility, respiratory phasicity and response to augmentation. Saphenofemoral Junction: No evidence of thrombus. Normal compressibility and flow on color Doppler imaging. Profunda Femoral Vein: Occlusive thrombus. Femoral Vein: Occlusive thrombus. Popliteal Vein: Above the knee amputation. Calf Veins: Above the knee amputation. IMPRESSION: 1. Positive for DVT with occlusive thrombus in the left femoral and profundus femoral vein. 2. No DVT in the right lower extremity. These results were called by telephone at the time of interpretation on 03/07/2023 at 10:35 pm to provider [facility] , who verbally acknowledged these results. Electronically Signed   By: Andrea Gasman M.D.   On: 03/07/2023 22:35   CT Angio Chest PE W/Cm &/Or Wo Cm Result  Date: 03/06/2023 CLINICAL DATA:  Shortness of breath with exertion EXAM: CT ANGIOGRAPHY CHEST WITH CONTRAST TECHNIQUE: Multidetector CT imaging of the chest was performed using the standard protocol during bolus administration of intravenous contrast. Multiplanar CT image reconstructions and MIPs were obtained to evaluate the vascular anatomy. RADIATION DOSE REDUCTION: This exam was performed according to the departmental dose-optimization program which includes automated exposure control, adjustment of the mA and/or kV according to patient size and/or use of iterative reconstruction technique. CONTRAST:  OMNIPAQUE  IOHEXOL  350 MG/ML SOLN COMPARISON:  Chest x-ray from earlier in the same day, CT from 09/30/2022. FINDINGS: Cardiovascular: Atherosclerotic calcifications of the thoracic aorta are noted. Dilatation of the ascending aorta to 4.3 cm is noted. No evidence of dissection is seen. The  pulmonary artery shows a normal branching pattern bilaterally. No definitive filling defect to suggest pulmonary embolism is seen. Heart is mildly enlarged in size. No significant coronary calcifications noted. Venous collaterals are noted about the left shoulder and neck suspicious for central left subclavian vein narrowing. Right chest wall port is noted. Mediastinum/Nodes: No hilar or mediastinal adenopathy is noted. The esophagus as visualized is within normal limits. Thoracic inlet is unremarkable. Lungs/Pleura: Lungs are well aerated bilaterally. No focal infiltrate or sizable effusion is seen. Upper Abdomen: Upper abdomen shows multiple gallstones without complicating factors. Simple appearing left renal cysts are noted stable from prior exams. Musculoskeletal: Postsurgical changes at the cervicothoracic junction are seen. Scattered areas of sclerosis throughout the bony structures consistent with metastatic disease. This appears increased when compared with the prior exam. This is consistent with the patient's given clinical history of prostate carcinoma. Review of the MIP images confirms the above findings. IMPRESSION: No evidence of pulmonary emboli. 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 Recommend annual imaging followup by CTA or MRA. This recommendation follows 2010 ACCF/AHA/AATS/ACR/ASA/SCA/SCAI/SIR/STS/SVM Guidelines for the Diagnosis and Management of Patients with Thoracic Aortic Disease. Circulation. 2010; 121: Z733-z630. Aortic aneurysm NOS (ICD10-I71.9) Changes consistent with prostate metastatic disease in the bony structures. Heavy collaterals around the left chest wall and shoulder suspicious for left subclavian narrowing. Aortic Atherosclerosis (ICD10-I70.0). Electronically Signed   By: Oneil Devonshire M.D.   On: 03/06/2023 21:56   DG Chest 2 View Result Date: 03/06/2023 CLINICAL DATA:  Dyspnea on exertion for several days EXAM: CHEST - 2 VIEW COMPARISON:  None Available. FINDINGS:  Frontal and lateral views of the chest demonstrate right chest wall port tip within the superior vena cava. Cardiac silhouette is unremarkable. No airspace disease, effusion, or pneumothorax. No acute bony abnormalities. IMPRESSION: 1. No acute intrathoracic process. Electronically Signed   By: Ozell Daring M.D.   On: 03/06/2023 20:51    Microbiology: Results for orders placed or performed during the hospital encounter of 03/07/23  C Difficile Quick Screen w PCR reflex     Status: None   Collection Time: 03/09/23  9:00 AM   Specimen: STOOL  Result Value Ref Range Status   C Diff antigen NEGATIVE NEGATIVE Final   C Diff toxin NEGATIVE NEGATIVE Final   C Diff interpretation No C. difficile detected.  Final    Comment: Performed at [facility], [address] [place], [place] [address]  Gastrointestinal Panel by PCR , Stool     Status: None   Collection Time: 03/09/23  9:00 AM   Specimen: STOOL  Result Value Ref Range Status   Campylobacter species NOT DETECTED NOT DETECTED Final   Plesimonas shigelloides NOT DETECTED NOT DETECTED Final  Salmonella species NOT DETECTED NOT DETECTED Final   Yersinia enterocolitica NOT DETECTED NOT DETECTED Final   Vibrio species NOT DETECTED NOT DETECTED Final   Vibrio cholerae NOT DETECTED NOT DETECTED Final   Enteroaggregative E coli (EAEC) NOT DETECTED NOT DETECTED Final   Enteropathogenic E coli (EPEC) NOT DETECTED NOT DETECTED Final   Enterotoxigenic E coli (ETEC) NOT DETECTED NOT DETECTED Final   Shiga like toxin producing E coli (STEC) NOT DETECTED NOT DETECTED Final   Shigella/Enteroinvasive E coli (EIEC) NOT DETECTED NOT DETECTED Final   Cryptosporidium NOT DETECTED NOT DETECTED Final   Cyclospora cayetanensis NOT DETECTED NOT DETECTED Final   Entamoeba histolytica NOT DETECTED NOT DETECTED Final   Giardia lamblia NOT DETECTED NOT DETECTED Final   Adenovirus F40/41 NOT DETECTED NOT DETECTED Final   Astrovirus NOT DETECTED NOT  DETECTED Final   Norovirus GI/GII NOT DETECTED NOT DETECTED Final   Rotavirus A NOT DETECTED NOT DETECTED Final   Sapovirus (I, II, IV, and V) NOT DETECTED NOT DETECTED Final    Comment: Performed at [facility], [address] [place], [place] [address]    Labs: CBC: Recent Labs  Lab 03/06/23 1911 03/07/23 1521 03/08/23 0449 03/09/23 0921  WBC 3.9* 4.4 3.4* 3.0*  NEUTROABS 2.7 3.6  --   --   HGB 9.6* 9.5* 8.8* 9.5*  HCT 29.6* 28.1* 26.7* 29.3*  MCV 105.0* 100.4* 102.7* 103.5*  PLT 168 155 135* 135*   Basic Metabolic Panel: Recent Labs  Lab 03/07/23 1521 03/08/23 0449 03/09/23 0921 03/10/23 0601 03/11/23 0841  NA 135 135 137 139 141  K 3.4* 3.6 3.1* 3.3* 3.3*  CL 103 103 104 107 108  CO2 19* 21* 25 24 27   GLUCOSE 96 91 103* 90 98  BUN 16 14 12 12 12   CREATININE 0.59* 0.58* 0.70 0.73 0.69  CALCIUM 9.1 9.1 9.2 9.1 9.1  MG  --   --  1.9  --   --    Liver Function Tests: Recent Labs  Lab 03/07/23 1521  AST 43*  ALT 15  ALKPHOS 98  BILITOT 2.2*  PROT 6.2*  ALBUMIN  3.5   CBG: No results for input(s): GLUCAP in the last 168 hours.  Discharge time spent: greater than 30 minutes.  This record has been created using Conservation officer, historic buildings. Errors have been sought and corrected,but may not always be located. Such creation errors do not reflect on the standard of care.   Signed: Amaryllis Dare, MD [facility] 03/11/2023

## 2023-03-11 NOTE — Progress Notes (Signed)
 Prior-To-Admission Oral Chemotherapy for Treatment of Oncologic Disease   Per Dorn Mower, NP to it is ok to hold prior-to-admission oral chemotherapy regimen of darolutamide  (NUBEQA ).  Procedure Per Pharmacy & Therapeutics Committee Policy: Orders for continuation of home oral chemotherapy for treatment of an oncologic disease will be held unless approved by an oncologist during current admission.    For patients receiving oncology care at [facility], inpatient pharmacist contacts patient's oncologist during regular office hours to review. If earlier review is medically necessary, attending physician consults [facility] on-call oncologist   For patients receiving oncology care outside of [facility], attending physician consults patient's oncologist to review. If this oncologist or their coverage cannot be reached, attending physician consults [facility] on-call oncologist  Alfonso MARLA Buys, PharmD Pharmacy Resident  03/11/2023 10:17 AM

## 2023-03-11 NOTE — Progress Notes (Signed)
 Physical Therapy Treatment Patient Details Name: [name] MRN: [id number] DOB: Aug 01, 1955 Today's Date: 03/11/2023   History of Present Illness Pt is a 68 y/o male admitted secondary to acute onset of worsening left residual limb swelling with associated warmth, erythema, tenderness and pain. Pt found to have an acute DVT. 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    PT Comments  Patient supine in bed on arrival. Session focused on attempting lateral scoot transfer to recliner chair to simulate w/c t/f at home in preparation for discharge. Patient with extreme difficulty with attempted transfer and unable to complete due to difficulty with mattress firmness and set up. Patient continues to want to discharge home vs SNF. Will likely need supervision/assistance initially. Discharge plan remains appropriate.      If plan is discharge home, recommend the following: A lot of help with walking and/or transfers;A lot of help with bathing/dressing/bathroom;Assistance with cooking/housework;Help with stairs or ramp for entrance;Assist for transportation   Can travel by private vehicle     No  Equipment Recommendations  BSC/3in1;Hospital bed;Other (comment) (hospital bed with overhead trapeze bar)    Recommendations for Other Services       Precautions / Restrictions Precautions Precautions: Fall Precaution Comments: hx of bilateral transfemoral amputations, no prostheses here (too swollen to use anyways) Restrictions Weight Bearing Restrictions Per Provider Order: No     Mobility  Bed Mobility Overal bed mobility: Needs Assistance Bed Mobility: Supine to Sit Rolling: Max assist, Used rails   Supine to sit: Supervision, HOB elevated, Used rails     General bed mobility comments: very heavy bed rail use, very difficult for pt to complete    Transfers                    General transfer comment: Pt attempted a scoot transfer to recilner but unable to complete after getting close to the EOB and having difficulty with mattress firmness making it very difficult    Ambulation/Gait                   Stairs             Wheelchair Mobility     Tilt Bed    Modified Rankin (Stroke Patients Only)       Balance Overall balance assessment: Needs assistance Sitting-balance support: No upper extremity supported, Feet unsupported Sitting balance-Leahy Scale: Fair                                      Cognition Arousal: Alert Behavior During Therapy: WFL for tasks assessed/performed Overall Cognitive Status: Within Functional Limits for tasks assessed                                          Exercises      General Comments        Pertinent Vitals/Pain Pain Assessment Pain Assessment: Faces Faces Pain Scale: Hurts a little bit Pain Location: R groin Pain Descriptors / Indicators: Grimacing, Guarding, Aching Pain Intervention(s): Limited activity within patient's tolerance, Monitored during session, Repositioned    Home Living Family/patient expects to be discharged to:: Private residence Living Arrangements: Alone Available Help at Discharge: Friend(s);Available PRN/intermittently Type  of Home: House Home Access: Ramped entrance;Other (comment) (lift system)     Alternate Level Stairs-Number of Steps: has stair lift chair Home Layout: Two level;Able to live on main level with bedroom/bathroom Home Equipment: Shower seat;Grab bars - toilet;Grab bars - tub/shower;Wheelchair - [employer] - power Additional Comments: home is wheelchair accessible throughout    Prior Function            PT Goals (current goals can now be found in the care plan section) Acute Rehab PT Goals PT Goal Formulation: With patient Time For Goal Achievement: 03/24/23 Potential to Achieve Goals:  Good Progress towards PT goals: Progressing toward goals    Frequency    Min 1X/week      PT Plan      Co-evaluation PT/OT/SLP Co-Evaluation/Treatment: Yes Reason for Co-Treatment: For patient/therapist safety;To address functional/ADL transfers PT goals addressed during session: Mobility/safety with mobility OT goals addressed during session: ADL's and self-care      AM-PAC PT 6 Clicks Mobility   Outcome Measure  Help needed turning from your back to your side while in a flat bed without using bedrails?: A Lot Help needed moving from lying on your back to sitting on the side of a flat bed without using bedrails?: A Lot Help needed moving to and from a bed to a chair (including a wheelchair)?: Total Help needed standing up from a chair using your arms (e.g., wheelchair or bedside chair)?: Total Help needed to walk in hospital room?: Total Help needed climbing 3-5 steps with a railing? : Total 6 Click Score: 8    End of Session   Activity Tolerance: Patient tolerated treatment well;Patient limited by pain Patient left: in bed;with call bell/phone within reach;with bed alarm set Nurse Communication: Mobility status PT Visit Diagnosis: Other abnormalities of gait and mobility (R26.89)     Time: 9093-9065 PT Time Calculation (min) (ACUTE ONLY): 28 min  Charges:    $Therapeutic Activity: 8-22 mins PT General Charges $$ ACUTE PT VISIT: 1 Visit                     Maryanne Finder, PT, DPT Physical Therapist - [facility]  [facility]    [name] 03/11/2023, 10:22 AM

## 2023-03-11 NOTE — TOC Progression Note (Signed)
 Transition of Care [facility]) - Progression Note    Patient Details  Name: [name] MRN: [id number] Date of Birth: 11-Jan-1956  Transition of Care [facility]) CM/SW Contact  Alvie Speltz A Recia Sons, RN Phone Number: 03/11/2023, 11:49 AM  Clinical Narrative:    Chart reviewed.  Noted that patient has orders for discharge today.    I have spoken with patient and he informs that [facility] will be [facility] hospital bed for him.  Patient is active with [facility] and would like to continue to use their services.  I have made Larraine aware with [facility] that patient is stable for discharge today.  [facility] will provide patient with Home health RN, PT, OT, and in home aide.    I have arranged [facility] for to transport patient home today.    I have informed staff nurse of the above information.       Expected Discharge Plan: Home w Home Health Services Barriers to Discharge: No Barriers Identified  Expected Discharge Plan and Services       Living arrangements for the past 2 months: Single Family Home Expected Discharge Date: 03/11/23               DME Arranged:  [facility] bed [facility] arranged via [facility])         HH Arranged: RN, PT, OT, Nurse's Aide HH Agency: [facility] Date [facility] Agency Contacted: 03/11/23   Representative spoke with at [facility] Agency: Larraine   Social Determinants of Health (SDOH) Interventions SDOH Screenings   Food Insecurity: No Food Insecurity (03/09/2023)  Housing: Low Risk  (03/09/2023)  Transportation Needs: No Transportation Needs (03/09/2023)  Utilities: Not At Risk (03/09/2023)  Financial Resource Strain: High Risk (07/28/2021)   Received from [facility], [facility]  Physical Activity: Sufficiently Active (07/28/2021)   Received from [facility], [facility]  Social Connections: Socially Isolated (08/01/2021)   Received from [facility],  [facility]  Stress: Stress Concern Present (08/01/2021)   Received from [facility], [facility]  Tobacco Use: Medium Risk (03/07/2023)    Readmission Risk Interventions     No data to display

## 2023-03-13 ENCOUNTER — Ambulatory Visit: Payer: Medicare PPO | Admitting: Physician Assistant

## 2023-03-14 ENCOUNTER — Ambulatory Visit: Payer: Medicare PPO | Admitting: Student in an Organized Health Care Education/Training Program

## 2023-03-14 ENCOUNTER — Telehealth: Payer: Self-pay | Admitting: *Deleted

## 2023-03-14 NOTE — Telephone Encounter (Signed)
 Spoke with patient and gave him his appointments scheduled for 04/05/23.  RN also instructed patient that [facility] was awaiting his call regarding his hospital bed and he needed to call them at [phone] and if they are unable to set him up with a bed he desires then he will need to call his insurance company per previous documentation . Pt verbalized understanding.

## 2023-03-14 NOTE — Telephone Encounter (Signed)
 03/14/23 at 129 pm Rcvd msg from [facility] via parachute re: [facility] bed - "autopay on file, revalidated insurance for this year, advised copay- patient will call back after checking with bank/ address and phone confirmed for delivery"

## 2023-03-15 ENCOUNTER — Inpatient Hospital Stay: Payer: Medicare PPO | Admitting: Internal Medicine

## 2023-03-15 ENCOUNTER — Inpatient Hospital Stay: Payer: Medicare PPO

## 2023-03-22 ENCOUNTER — Other Ambulatory Visit: Payer: Self-pay

## 2023-03-22 DIAGNOSIS — T502X5A Adverse effect of carbonic-anhydrase inhibitors, benzothiadiazides and other diuretics, initial encounter: Secondary | ICD-10-CM | POA: Diagnosis present

## 2023-03-22 DIAGNOSIS — Z801 Family history of malignant neoplasm of trachea, bronchus and lung: Secondary | ICD-10-CM

## 2023-03-22 DIAGNOSIS — Z993 Dependence on wheelchair: Secondary | ICD-10-CM

## 2023-03-22 DIAGNOSIS — I82413 Acute embolism and thrombosis of femoral vein, bilateral: Principal | ICD-10-CM | POA: Diagnosis present

## 2023-03-22 DIAGNOSIS — Z981 Arthrodesis status: Secondary | ICD-10-CM

## 2023-03-22 DIAGNOSIS — L89893 Pressure ulcer of other site, stage 3: Secondary | ICD-10-CM | POA: Diagnosis present

## 2023-03-22 DIAGNOSIS — C7951 Secondary malignant neoplasm of bone: Secondary | ICD-10-CM | POA: Diagnosis present

## 2023-03-22 DIAGNOSIS — Z95828 Presence of other vascular implants and grafts: Secondary | ICD-10-CM

## 2023-03-22 DIAGNOSIS — G4730 Sleep apnea, unspecified: Secondary | ICD-10-CM | POA: Diagnosis present

## 2023-03-22 DIAGNOSIS — I5033 Acute on chronic diastolic (congestive) heart failure: Secondary | ICD-10-CM | POA: Diagnosis present

## 2023-03-22 DIAGNOSIS — K567 Ileus, unspecified: Secondary | ICD-10-CM | POA: Diagnosis not present

## 2023-03-22 DIAGNOSIS — E876 Hypokalemia: Secondary | ICD-10-CM | POA: Diagnosis present

## 2023-03-22 DIAGNOSIS — Z86718 Personal history of other venous thrombosis and embolism: Secondary | ICD-10-CM

## 2023-03-22 DIAGNOSIS — I82409 Acute embolism and thrombosis of unspecified deep veins of unspecified lower extremity: Secondary | ICD-10-CM | POA: Diagnosis not present

## 2023-03-22 DIAGNOSIS — Z820 Family history of epilepsy and other diseases of the nervous system: Secondary | ICD-10-CM

## 2023-03-22 DIAGNOSIS — Z87891 Personal history of nicotine dependence: Secondary | ICD-10-CM

## 2023-03-22 DIAGNOSIS — Z89611 Acquired absence of right leg above knee: Secondary | ICD-10-CM

## 2023-03-22 DIAGNOSIS — Z881 Allergy status to other antibiotic agents status: Secondary | ICD-10-CM

## 2023-03-22 DIAGNOSIS — Z86711 Personal history of pulmonary embolism: Secondary | ICD-10-CM

## 2023-03-22 DIAGNOSIS — Z7901 Long term (current) use of anticoagulants: Secondary | ICD-10-CM

## 2023-03-22 DIAGNOSIS — Z888 Allergy status to other drugs, medicaments and biological substances status: Secondary | ICD-10-CM

## 2023-03-22 DIAGNOSIS — I712 Thoracic aortic aneurysm, without rupture, unspecified: Secondary | ICD-10-CM | POA: Diagnosis present

## 2023-03-22 DIAGNOSIS — Z89612 Acquired absence of left leg above knee: Secondary | ICD-10-CM

## 2023-03-22 DIAGNOSIS — Z5986 Financial insecurity: Secondary | ICD-10-CM

## 2023-03-22 DIAGNOSIS — F32A Depression, unspecified: Secondary | ICD-10-CM | POA: Diagnosis present

## 2023-03-22 DIAGNOSIS — Z79899 Other long term (current) drug therapy: Secondary | ICD-10-CM

## 2023-03-22 DIAGNOSIS — I739 Peripheral vascular disease, unspecified: Secondary | ICD-10-CM | POA: Diagnosis present

## 2023-03-22 DIAGNOSIS — Z6841 Body Mass Index (BMI) 40.0 and over, adult: Secondary | ICD-10-CM

## 2023-03-22 DIAGNOSIS — I110 Hypertensive heart disease with heart failure: Secondary | ICD-10-CM | POA: Diagnosis present

## 2023-03-22 DIAGNOSIS — Z8546 Personal history of malignant neoplasm of prostate: Secondary | ICD-10-CM

## 2023-03-22 DIAGNOSIS — K5939 Other megacolon: Secondary | ICD-10-CM | POA: Diagnosis present

## 2023-03-22 DIAGNOSIS — F419 Anxiety disorder, unspecified: Secondary | ICD-10-CM | POA: Diagnosis present

## 2023-03-22 DIAGNOSIS — L259 Unspecified contact dermatitis, unspecified cause: Secondary | ICD-10-CM | POA: Diagnosis present

## 2023-03-22 DIAGNOSIS — Z1152 Encounter for screening for COVID-19: Secondary | ICD-10-CM

## 2023-03-22 DIAGNOSIS — K5981 Ogilvie syndrome: Secondary | ICD-10-CM | POA: Diagnosis present

## 2023-03-22 DIAGNOSIS — L89322 Pressure ulcer of left buttock, stage 2: Secondary | ICD-10-CM | POA: Diagnosis present

## 2023-03-22 LAB — COMPREHENSIVE METABOLIC PANEL
ALT: 19 U/L (ref 0–44)
AST: 32 U/L (ref 15–41)
Albumin: 3.6 g/dL (ref 3.5–5.0)
Alkaline Phosphatase: 108 U/L (ref 38–126)
Anion gap: 11 (ref 5–15)
BUN: 12 mg/dL (ref 8–23)
CO2: 26 mmol/L (ref 22–32)
Calcium: 8.8 mg/dL — ABNORMAL LOW (ref 8.9–10.3)
Chloride: 106 mmol/L (ref 98–111)
Creatinine, Ser: 0.87 mg/dL (ref 0.61–1.24)
GFR, Estimated: >60 mL/min (ref >60)
Glucose, Bld: 94 mg/dL (ref 70–99)
Potassium: 2.8 mmol/L — ABNORMAL LOW (ref 3.5–5.1)
Sodium: 143 mmol/L (ref 135–145)
Total Bilirubin: 1.3 mg/dL — ABNORMAL HIGH (ref 0.0–1.2)
Total Protein: 6.3 g/dL — ABNORMAL LOW (ref 6.5–8.1)

## 2023-03-22 LAB — CBC
HCT: 32.8 % — ABNORMAL LOW (ref 39.0–52.0)
Hemoglobin: 10.4 g/dL — ABNORMAL LOW (ref 13.0–17.0)
MCH: 32.9 pg (ref 26.0–34.0)
MCHC: 31.7 g/dL (ref 30.0–36.0)
MCV: 103.8 fL — ABNORMAL HIGH (ref 80.0–100.0)
Platelets: 230 10*3/uL (ref 150–400)
RBC: 3.16 MIL/uL — ABNORMAL LOW (ref 4.22–5.81)
RDW: 14.1 % (ref 11.5–15.5)
WBC: 4.4 10*3/uL (ref 4.0–10.5)
nRBC: 0 % (ref 0.0–0.2)

## 2023-03-22 LAB — BRAIN NATRIURETIC PEPTIDE: B Natriuretic Peptide: 140 pg/mL — ABNORMAL HIGH (ref 0.0–100.0)

## 2023-03-22 LAB — D-DIMER, QUANTITATIVE: D-Dimer, Quant: 1.87 ug{FEU}/mL — ABNORMAL HIGH (ref 0.00–0.50)

## 2023-03-22 NOTE — ED Triage Notes (Signed)
Pt sts that he recently was hospitalized with lower ext edema due to blood clots. Pt sts that since then he has been having increased edema and his scrotum is very large.

## 2023-03-22 NOTE — ED Notes (Signed)
[facility] from home. Bilateral BKA. Swelling to bilateral legs, hands. No hx of chf. Chemo 2-4weeks ago.   122/80 79 99%vRa

## 2023-03-23 ENCOUNTER — Inpatient Hospital Stay (HOSPITAL_BASED_OUTPATIENT_CLINIC_OR_DEPARTMENT_OTHER)
Admit: 2023-03-23 | Discharge: 2023-03-23 | Disposition: A | Discharge status: Home / Self Care | Payer: Medicare PPO | Attending: Family Medicine | Admitting: Family Medicine

## 2023-03-23 ENCOUNTER — Inpatient Hospital Stay: Payer: Medicare PPO

## 2023-03-23 ENCOUNTER — Emergency Department: Payer: Medicare PPO

## 2023-03-23 ENCOUNTER — Encounter: Payer: Self-pay | Admitting: Internal Medicine

## 2023-03-23 ENCOUNTER — Inpatient Hospital Stay
Admission: EM | Admit: 2023-03-23 | Discharge: 2023-04-05 | DRG: 299 | Disposition: A | Discharge status: Home Health | Payer: Medicare PPO | Attending: Internal Medicine | Admitting: Internal Medicine

## 2023-03-23 DIAGNOSIS — I10 Essential (primary) hypertension: Secondary | ICD-10-CM | POA: Diagnosis present

## 2023-03-23 DIAGNOSIS — C7951 Secondary malignant neoplasm of bone: Secondary | ICD-10-CM | POA: Diagnosis present

## 2023-03-23 DIAGNOSIS — I890 Lymphedema, not elsewhere classified: Secondary | ICD-10-CM | POA: Diagnosis not present

## 2023-03-23 DIAGNOSIS — I517 Cardiomegaly: Secondary | ICD-10-CM | POA: Diagnosis not present

## 2023-03-23 DIAGNOSIS — I82409 Acute embolism and thrombosis of unspecified deep veins of unspecified lower extremity: Secondary | ICD-10-CM | POA: Diagnosis present

## 2023-03-23 DIAGNOSIS — I824Y1 Acute embolism and thrombosis of unspecified deep veins of right proximal lower extremity: Secondary | ICD-10-CM | POA: Diagnosis not present

## 2023-03-23 DIAGNOSIS — K567 Ileus, unspecified: Secondary | ICD-10-CM | POA: Diagnosis present

## 2023-03-23 DIAGNOSIS — R0602 Shortness of breath: Secondary | ICD-10-CM

## 2023-03-23 DIAGNOSIS — R197 Diarrhea, unspecified: Secondary | ICD-10-CM | POA: Diagnosis present

## 2023-03-23 DIAGNOSIS — R601 Generalized edema: Secondary | ICD-10-CM | POA: Diagnosis present

## 2023-03-23 DIAGNOSIS — L8990 Pressure ulcer of unspecified site, unspecified stage: Secondary | ICD-10-CM | POA: Diagnosis present

## 2023-03-23 DIAGNOSIS — K5900 Constipation, unspecified: Secondary | ICD-10-CM

## 2023-03-23 DIAGNOSIS — C61 Malignant neoplasm of prostate: Secondary | ICD-10-CM | POA: Diagnosis present

## 2023-03-23 DIAGNOSIS — K5981 Ogilvie syndrome: Secondary | ICD-10-CM

## 2023-03-23 DIAGNOSIS — I82411 Acute embolism and thrombosis of right femoral vein: Principal | ICD-10-CM

## 2023-03-23 LAB — RESP PANEL BY RT-PCR (RSV, FLU A&B, COVID)  RVPGX2
Influenza A by PCR: NEGATIVE
Influenza B by PCR: NEGATIVE
Resp Syncytial Virus by PCR: NEGATIVE
SARS Coronavirus 2 by RT PCR: NEGATIVE

## 2023-03-23 LAB — ECHOCARDIOGRAM COMPLETE
AR max vel: 2.31 cm2
AV Area VTI: 2 cm2
AV Area mean vel: 2.5 cm2
AV Mean grad: 5 mm[Hg]
AV Peak grad: 10.6 mm[Hg]
Ao pk vel: 1.63 m/s
Area-P 1/2: 4.74 cm2
MV VTI: 2.31 cm2
S' Lateral: 3.6 cm

## 2023-03-23 LAB — HEPARIN LEVEL (UNFRACTIONATED): Heparin Unfractionated: 1.04 [IU]/mL — ABNORMAL HIGH (ref 0.30–0.70)

## 2023-03-23 LAB — PROTIME-INR
INR: 1.3 — ABNORMAL HIGH (ref 0.8–1.2)
Prothrombin Time: 16.1 s — ABNORMAL HIGH (ref 11.4–15.2)

## 2023-03-23 LAB — APTT: aPTT: 34 s (ref 24–36)

## 2023-03-23 MED ORDER — DIPHENOXYLATE-ATROPINE 2.5-0.025 MG PO TABS
1.0000 | ORAL_TABLET | Freq: Four times a day (QID) | ORAL | Status: DC | PRN
  Administered 2023-03-23 – 2023-03-25 (×5): 1 via ORAL
  Filled 2023-03-23 (×5): qty 1

## 2023-03-23 MED ORDER — ONDANSETRON HCL 4 MG/2ML IJ SOLN
4.0000 mg | Freq: Four times a day (QID) | INTRAMUSCULAR | Status: DC | PRN
  Administered 2023-03-25: 4 mg via INTRAVENOUS
  Filled 2023-03-23: qty 2

## 2023-03-23 MED ORDER — HEPARIN (PORCINE) 25000 UT/250ML-% IV SOLN
1400.0000 [IU]/h | INTRAVENOUS | Status: DC
  Administered 2023-03-23: 1400 [IU]/h via INTRAVENOUS
  Filled 2023-03-23: qty 250

## 2023-03-23 MED ORDER — HEPARIN (PORCINE) 25000 UT/250ML-% IV SOLN
1200.0000 [IU]/h | INTRAVENOUS | Status: DC
  Administered 2023-03-23 – 2023-03-24 (×2): 1200 [IU]/h via INTRAVENOUS
  Filled 2023-03-23: qty 250

## 2023-03-23 MED ORDER — ONDANSETRON HCL 4 MG PO TABS
4.0000 mg | ORAL_TABLET | Freq: Four times a day (QID) | ORAL | Status: DC | PRN
  Administered 2023-03-23 – 2023-03-24 (×2): 4 mg via ORAL
  Filled 2023-03-23 (×2): qty 1

## 2023-03-23 MED ORDER — SODIUM CHLORIDE 0.9% FLUSH
3.0000 mL | Freq: Two times a day (BID) | INTRAVENOUS | Status: DC
  Administered 2023-03-23 – 2023-04-05 (×25): 3 mL via INTRAVENOUS

## 2023-03-23 MED ORDER — IOHEXOL 350 MG/ML SOLN
80.0000 mL | Freq: Once | INTRAVENOUS | Status: AC | PRN
  Administered 2023-03-23: 80 mL via INTRAVENOUS

## 2023-03-23 MED ORDER — HEPARIN BOLUS VIA INFUSION
5000.0000 [IU] | Freq: Once | INTRAVENOUS | Status: AC
  Administered 2023-03-23: 5000 [IU] via INTRAVENOUS
  Filled 2023-03-23: qty 5000

## 2023-03-23 MED ORDER — SODIUM CHLORIDE 0.9% FLUSH: 3.0000 mL | INTRAVENOUS | Status: DC | PRN

## 2023-03-23 MED ORDER — PERFLUTREN LIPID MICROSPHERE
2.0000 mL | INTRAVENOUS | Status: AC | PRN
  Administered 2023-03-23: 2 mL via INTRAVENOUS

## 2023-03-23 MED ORDER — POTASSIUM CHLORIDE CRYS ER 20 MEQ PO TBCR
40.0000 meq | EXTENDED_RELEASE_TABLET | Freq: Once | ORAL | Status: AC
  Administered 2023-03-23: 40 meq via ORAL
  Filled 2023-03-23: qty 2

## 2023-03-23 MED ORDER — ALBUTEROL SULFATE (2.5 MG/3ML) 0.083% IN NEBU
2.5000 mg | INHALATION_SOLUTION | RESPIRATORY_TRACT | Status: DC | PRN
  Filled 2023-03-23: qty 3

## 2023-03-23 MED ORDER — SODIUM CHLORIDE 0.9 % IV SOLN: 250.0000 mL | INTRAVENOUS | Status: AC | PRN

## 2023-03-23 NOTE — Progress Notes (Signed)
PHARMACY - ANTICOAGULATION CONSULT NOTE  Pharmacy Consult for Enoxaparin Indication: DVT  Allergies  Allergen Reactions   Bacitracin Hives   Vancomycin Other (See Comments) and Rash    Red man syndrome.   Ketorolac     Other Reaction(s): Kidney Disorder  Other reaction(s): Kidney Disorder   Spironolactone     Other Reaction(s): Unknown  Other reaction(s): Unknown    Patient Measurements: Weight: 136.1 kg (300 lb) Heparin Dosing Weight: 78.7 kg  Vital Signs: Temp: 98.5 F (36.9 C) (01/18 0600) Temp Source: Oral (01/18 0600) BP: 124/79 (01/18 0600) Pulse Rate: 70 (01/18 0600)  Labs: Recent Labs    03/22/23 2201 03/23/23 0636  HGB 10.4*  --   HCT 32.8*  --   PLT 230  --   APTT  --  34  LABPROT  --  16.1*  INR  --  1.3*  CREATININE 0.87  --     Estimated Creatinine Clearance: 95.2 mL/min (by C-G formula based on SCr of 0.87 mg/dL).   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 Patient was on  warfarin prior to admission w/ INR 1.3  Goal of Therapy:  Heparin level 0.3-0.7 units/ml  Monitor platelets by anticoagulation protocol: Yes   Plan:  Give 5000 units bolus x 1 Start heparin infusion at 1400 units/hr Check anti-Xa level in 6 hours and daily while on heparin Continue to monitor H&H and platelets Continue to monitor CBC q72h   Lowella Bandy, PharmD Clinical Pharmacist 03/23/2023,7:24 AM

## 2023-03-23 NOTE — Assessment & Plan Note (Signed)
BP stable Titrate home regimen 

## 2023-03-23 NOTE — H&P (Addendum)
History and Physical    Patient: [name] XNA:[id number] DOB: [birth year] DOA: 03/23/2023 DOS: the patient was seen and examined on 03/23/2023 PCP: Dione Housekeeper, MD  Patient coming from: Home  Chief Complaint:  Chief Complaint  Patient presents with   Leg Swelling   HPI: [name] is a 68 y.o. male with medical history significant of stage IV prostate cancer, recurrent DVT and PE, lymphedema, hypertension, status post AKA bilaterally, pressure ulcers presenting with recurrent DVT.  Patient noted to have been admitted January 2 January 6 for acute left-sided DVT.  Had DVT recurrence despite Eliquis use.  Was transition from heparin to Lovenox at discharge.  Patient states he was unable to afford it and was subsequently transition to Coumadin by PCP.  Coumadin Level has been subtherapeutic per report.  Has had worsening shortness of breath lower extremity swelling and pain x 2 to 3 days.  Noted baseline bilateral AKA.  Mild orthopnea.  No chest pain.  No fevers or chills.  Baseline stage IV prostate cancer.  Followed by Dr. Donneta Romberg.  Non-smoker. Presented to the ER afebrile, hemodynamically stable.  Satting well on room air.  White count 4.4, hemoglobin 10.4, platelets 230, INR 1.3, COVID flu and RSV negative.  Creatinine 0.87.  BNP 140.  Chest x-ray with cardiomegaly.  Lower extremity ultrasound with occlusive thrombus in the proximal segment of right femoral vein.  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 Review of Systems: As mentioned in the history of present illness. All other systems reviewed and are negative. Past Medical History:  Diagnosis Date   Depression    History of blood clots 08/13/2011   History of left knee replacement    HTN (hypertension)    Lymphedema    lt leg-per pt   PE (pulmonary thromboembolism) (HCC)    Pressure injury of skin, unspecified injury stage, unspecified location    S/P AKA (above knee amputation) bilateral (HCC)    rt  11/24/07 and lt 01/11/17   Past Surgical History:  Procedure Laterality Date   ACHILLES TENDON SURGERY Left    per pt   bone clip removal Left 10/04/2015   knee   CARDIAC SURGERY  11/2012   cath.-per pt   CERVICAL FUSION  07/28/2015   per pt   EPIGASTRIC HERNIA REPAIR     per pt   hematoma removal Left 11/02/2015   knee   HIP SURGERY Right    x8 per pt   IR IMAGING GUIDED PORT INSERTION  11/14/2022   KNEE FUSION Right    KNEE JOINT MANIPULATION Left 09/11/2011   KNEE SURGERY Left    x3   leg stump Left 03/2017   leg surgery-per pt   NECK SURGERY  02/2015   plates-per pt   REPLACEMENT TOTAL KNEE Right    REPLACEMENT TOTAL KNEE Left 07/02/2011   again in 07/10/2011 per pt   s/p of bilateral AKA Right    screen filter removal  10/20/2008   for embolism per pt   screen filter replacement     x3   SHOULDER SURGERY Right    x2-per pt   SPINE SURGERY  02/11/2015   vertebra spacer-per pt   toe nail removal Right    per pt   VARICOSE VEIN SURGERY     x5-per pt   Social History:  reports that he has never smoked. He has quit using smokeless tobacco. He reports current alcohol use of about 3.0 standard drinks of alcohol per  week. He reports that he does not use drugs.  Allergies  Allergen Reactions   Bacitracin Hives   Vancomycin Other (See Comments) and Rash    Red man syndrome.   Ketorolac     Other Reaction(s): Kidney Disorder  Other reaction(s): Kidney Disorder   Spironolactone     Other Reaction(s): Unknown  Other reaction(s): Unknown    Family History  Problem Relation Age of Onset   Alzheimer's disease Mother    Lung cancer Father     Prior to Admission medications   Medication Sig Start Date End Date Taking? Authorizing Provider  buPROPion (WELLBUTRIN XL) 300 MG 24 hr tablet Take 300 mg by mouth daily. 07/26/22   [provider]  darolutamide (NUBEQA) 300 MG tablet Take 600 mg by mouth 2 (two) times daily with a meal.    Earna Coder, MD   diphenoxylate-atropine (LOMOTIL) 2.5-0.025 MG tablet Take 1 tablet by mouth 4 (four) times daily as needed for diarrhea or loose stools. 03/11/23   Arnetha Courser, MD  enoxaparin (LOVENOX) 150 MG/ML injection Inject 0.86 mLs (130 mg total) into the skin every 12 (twelve) hours. 03/11/23   Arnetha Courser, MD  gabapentin (NEURONTIN) 300 MG capsule Take 2 capsules (600 mg total) by mouth 3 (three) times daily. 02/15/23 06/15/23  Borders, Daryl Eastern, NP  leptospermum manuka honey (MEDIHONEY) PSTE paste Apply 1 Application topically daily. 03/11/23   Arnetha Courser, MD  lidocaine-prilocaine (EMLA) cream Apply 1 Application topically as needed. 11/27/22   Earna Coder, MD  lisinopril (ZESTRIL) 20 MG tablet Take 20 mg by mouth 2 (two) times daily. 06/29/19 01/01/23  [provider]  ondansetron (ZOFRAN) 8 MG tablet Take 1 tablet (8 mg total) by mouth every 8 (eight) hours as needed for nausea or vomiting. 11/29/22   Earna Coder, MD  oxyCODONE-acetaminophen (PERCOCET/ROXICET) 5-325 MG tablet Take 1 tablet by mouth every 12 (twelve) hours as needed for moderate pain (pain score 4-6). 01/02/23   Borders, Daryl Eastern, NP  senna-docusate (SENOKOT-S) 8.6-50 MG tablet Take 2 tablets by mouth 2 (two) times daily as needed for mild constipation. 10/01/22   Marrion Coy, MD    Physical Exam: Vitals:   03/22/23 2200 03/23/23 0158 03/23/23 0530 03/23/23 0600  BP: (!) 141/82 120/75 122/74 124/79  Pulse: 81 73 73 70  Resp: 17 18 14 12   Temp: 98 F (36.7 C) 98.3 F (36.8 C)  98.5 F (36.9 C)  TempSrc: Oral Oral  Oral  SpO2: 100% 98% 95% 97%  Weight: 136.1 kg      Physical Exam Constitutional:      Appearance: He is obese.  HENT:     Head: Normocephalic and atraumatic.     Nose: Nose normal.     Mouth/Throat:     Mouth: Mucous membranes are moist.  Eyes:     Pupils: Pupils are equal, round, and reactive to light.  Cardiovascular:     Rate and Rhythm: Normal rate and regular rhythm.   Pulmonary:     Effort: Pulmonary effort is normal.  Abdominal:     General: Bowel sounds are normal.  Musculoskeletal:     Right lower leg: Edema present.     Left lower leg: Edema present.     Comments: S/p bilateral AKA   Neurological:     General: No focal deficit present.  Psychiatric:        Mood and Affect: Mood normal.     Data Reviewed:  There are  no new results to review at this time.  DG Chest 2 View CLINICAL DATA:  68 year old male with history of dyspnea. Swelling in bilateral legs.  EXAM: CHEST - 2 VIEW  COMPARISON:  Chest x-ray 03/06/2023.  FINDINGS: Right internal jugular single-lumen power porta cath with tip terminating in the distal superior vena cava. Lung volumes are very low and film is under penetrated limiting the diagnostic sensitivity and specificity of the examination. With these limitations in mind, bibasilar opacities likely reflect subsegmental atelectasis. No definite consolidative airspace disease. No definite pleural effusions. No pneumothorax. Cephalization of the pulmonary vasculature, without frank pulmonary edema. Mild cardiomegaly. Upper mediastinal contours are within normal limits. Orthopedic fixation hardware in the lower cervical spine incidentally noted.  IMPRESSION: 1. Low lung volumes with bibasilar subsegmental atelectasis. 2. Mild cardiomegaly with pulmonary venous congestion, but no frank pulmonary edema.  Electronically Signed   By: Trudie Reed M.D.   On: 03/23/2023 05:42 US Venous Img Lower Bilateral (DVT) CLINICAL DATA:  Left leg swelling, bilateral above knee amputation  EXAM: BILATERAL LOWER EXTREMITY VENOUS DOPPLER ULTRASOUND  TECHNIQUE: Gray-scale sonography with graded compression, as well as color Doppler and duplex ultrasound were performed to evaluate the lower extremity deep venous systems from the level of the common femoral vein and including the common femoral, femoral, profunda  femoral, popliteal and calf veins including the posterior tibial, peroneal and gastrocnemius veins when visible. The superficial great saphenous vein was also interrogated. Spectral Doppler was utilized to evaluate flow at rest and with distal augmentation maneuvers in the common femoral, femoral and popliteal veins.  COMPARISON:  03/07/2023  FINDINGS: RIGHT LOWER EXTREMITY  Common Femoral Vein: No evidence of thrombus. Normal compressibility, respiratory phasicity and response to augmentation.  Saphenofemoral Junction: No evidence of thrombus. Normal compressibility and flow on color Doppler imaging.  Profunda Femoral Vein: No evidence of thrombus. Normal compressibility and flow on color Doppler imaging.  Femoral Vein: Limited evaluation due to patient's body habitus. Interval development of occlusive thrombus within the visualized proximal segment. The distal segment is not well visualized.  Popliteal Vein: Absent  Calf Veins: Absent  Superficial Great Saphenous Vein: Not well visualized  Venous Reflux:  Not assessed  Other Findings:  None.  LEFT LOWER EXTREMITY  Common Femoral Vein: No evidence of thrombus. Normal compressibility, respiratory phasicity and response to augmentation.  Saphenofemoral Junction: No evidence of thrombus. Normal compressibility and flow on color Doppler imaging.  Profunda Femoral Vein: Occlusive thrombus within the visualized segment, similar prior examination  Femoral Vein: Limited visualization due to the patient's body habitus. Persistent occlusive thrombus within the visualized proximal and the mid segment.  Popliteal Vein: Absent  Calf Veins: Absent  Superficial Great Saphenous Vein: Not well assessed on this examination  Venous Reflux:  Not assessed  Other Findings:  None.  IMPRESSION: 1. Interval development of occlusive thrombus within the visualized proximal segment of the right femoral vein. 2. Persistent  occlusive thrombus within the visualized segment of the left profunda femoral vein and proximal and mid segment of the left femoral vein.  Electronically Signed   By: Helyn Numbers M.D.   On: 03/23/2023 04:50  Lab Results  Component Value Date   WBC 4.4 03/22/2023   HGB 10.4 (L) 03/22/2023   HCT 32.8 (L) 03/22/2023   MCV 103.8 (H) 03/22/2023   PLT 230 03/22/2023   Last metabolic panel Lab Results  Component Value Date   GLUCOSE 94 03/22/2023   NA 143 03/22/2023   K 2.8 (  L) 03/22/2023   CL 106 03/22/2023   CO2 26 03/22/2023   BUN 12 03/22/2023   CREATININE 0.87 03/22/2023   GFRNONAA >60 03/22/2023   CALCIUM 8.8 (L) 03/22/2023   PROT 6.3 (L) 03/22/2023   ALBUMIN 3.6 03/22/2023   BILITOT 1.3 (H) 03/22/2023   ALKPHOS 108 03/22/2023   AST 32 03/22/2023   ALT 19 03/22/2023   ANIONGAP 11 03/22/2023    Assessment and Plan: DVT, lower extremity, recurrent (HCC) Lower extremity swelling with noted occlusive right femoral vein thrombus as well as 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 Evie Lacks- no intervention at present w/ noted subtherapeutic INR  Will check CT of the chest to rule out PE given shortness of breath    Shortness of breath Progressive shortness of breath and generalized swelling in the setting of active malignancy and recurrent DVT Positive cardiomegaly on chest x-ray BNP 140 Will check CT of the chest to rule out PE as contributory factor 2D echo Anticipate some potential element of right heart failure given recurrent history of PE Body habitus is also confounding issue IV Lasix Weight today  136.1 Cardiology consultation as clinically indicated  Prostate cancer metastatic to bone (HCC) Baseline stage 4 metastatic prostate with bone involvement  Followed by Dr. 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 Inpatient status is judged to be reasonable and necessary in order to provide the required intensity of service to ensure the patient's safety. The patient's presenting symptoms, physical exam findings, and initial radiographic and laboratory data in the context of their chronic comorbidities is felt to place them at high risk for further clinical deterioration. Furthermore, it is not anticipated that the patient will be medically stable for discharge from the hospital within 2 midnights of admission.   * I certify that at the point of admission it is my clinical judgment that the patient will require inpatient hospital care spanning beyond 2 midnights from the point of admission due to high intensity of service, high risk for further deterioration and high frequency of surveillance required.*  Author: Floydene Flock, MD 03/23/2023 8:51 AM  For on call review [email or web address].

## 2023-03-23 NOTE — Assessment & Plan Note (Signed)
Chronic due to poor ambulation Wound care consult

## 2023-03-23 NOTE — Assessment & Plan Note (Signed)
Baseline stage 4 metastatic prostate with bone involvement  Followed by Dr. Donneta Romberg  Cont Merleen Nicely

## 2023-03-23 NOTE — Progress Notes (Signed)
PHARMACY - ANTICOAGULATION CONSULT NOTE  Pharmacy Consult for Enoxaparin Indication: DVT  Allergies  Allergen Reactions   Bacitracin Hives   Vancomycin Other (See Comments) and Rash    Red man syndrome.   Ketorolac     Other Reaction(s): Kidney Disorder  Other reaction(s): Kidney Disorder   Spironolactone     Other Reaction(s): Unknown  Other reaction(s): Unknown    Patient Measurements: Height: 4\' 10"  (147.3 cm) Weight: 136.1 kg (300 lb) IBW/kg (Calculated) : 45.4 Heparin Dosing Weight: 78.7 kg  Vital Signs: Temp: 98 F (36.7 C) (01/18 1633) Temp Source: Oral (01/18 1633) BP: 131/94 (01/18 1633) Pulse Rate: 85 (01/18 1633)  Labs: Recent Labs    03/22/23 2201 03/23/23 0636 03/23/23 1611  HGB 10.4*  --   --   HCT 32.8*  --   --   PLT 230  --   --   APTT  --  34  --   LABPROT  --  16.1*  --   INR  --  1.3*  --   HEPARINUNFRC  --   --  1.04*  CREATININE 0.87  --   --     Estimated Creatinine Clearance: 95.2 mL/min (by C-G formula based on SCr of 0.87 mg/dL).   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 Patient was on  warfarin prior to admission w/ INR 1.3  01/18 1611 HL 1.04  SUPRAtherapeutic  Per RN, level was drawn appropriately. No signs/symptoms of bleeding noted.  Goal of Therapy:  Heparin level 0.3-0.7 units/ml  Monitor platelets by anticoagulation protocol: Yes   Plan:  Hold heparin infusion  for 1 hour Resume heparin infusion at decreased rate of 1200 units/hour Check anti-Xa level in 6 hours after rate change Continue to monitor H&H and platelets  Thank you for involving pharmacy in this patient's care.   Rockwell Alexandria, PharmD Clinical Pharmacist 03/23/2023 4:42 PM

## 2023-03-23 NOTE — Assessment & Plan Note (Addendum)
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 Evie Lacks- no intervention at present w/ noted subtherapeutic INR  Will check CT of the chest to rule out PE given shortness of breath

## 2023-03-23 NOTE — ED Notes (Signed)
Pt provided with extensive assistance for pericare and bedding change by two RN' d/t diarrhea.  This RN sent NP Jon Billings a message requesting medication for diarrhea.

## 2023-03-23 NOTE — Progress Notes (Signed)
*  PRELIMINARY RESULTS* Echocardiogram 2D Echocardiogram has been performed.  [name] 03/23/2023, 1:28 PM

## 2023-03-23 NOTE — ED Notes (Signed)
Pt given peanut butter, crackers, apple sauce and gingerale per his request.

## 2023-03-23 NOTE — ED Notes (Signed)
Pt placed on bedpan per his request with assistance of 2nd RN.

## 2023-03-23 NOTE — Assessment & Plan Note (Signed)
Progressive shortness of breath and generalized swelling in the setting of active malignancy and recurrent DVT Positive cardiomegaly on chest x-ray BNP 140 Will check CT of the chest to rule out PE as contributory factor 2D echo Anticipate some potential element of right heart failure given recurrent history of PE Body habitus is also confounding issue IV Lasix Weight today 136.1 Cardiology consultation as clinically indicated

## 2023-03-23 NOTE — Assessment & Plan Note (Signed)
Stable Continue Lomotil

## 2023-03-23 NOTE — ED Notes (Signed)
Placed on hospital bed and repositioned off of sacrum for comfort. A&O x 4. No complaints. No signs of breathing, no complaints of shortness of breath unless head of bed is laid flat.

## 2023-03-23 NOTE — ED Provider Notes (Signed)
[facility] Provider Note    Event Date/Time   First MD Initiated Contact with Patient 03/23/23 0421     (approximate)   History   Leg Swelling   HPI  [name] is a 68 y.o. male with a history of hypertension PE recent acute left leg DVT, prostate cancer cellulitis of the right leg   He has noticed that his whole body feels swollen he feels swollen in both of his arms both of his legs and is also noticed swelling to his scrotum.  No chest pain.  No shortness of breath.  He has chronic bedsores reports no acute issue with those  Physical Exam   Triage Vital Signs: ED Triage Vitals [03/22/23 2200]  Encounter Vitals Group     BP (!) 141/82     Systolic BP Percentile      Diastolic BP Percentile      Pulse Rate 81     Resp 17     Temp 98 F (36.7 C)     Temp Source Oral     SpO2 100 %     Weight 300 lb (136.1 kg)     Height      Head Circumference      Peak Flow      Pain Score 8     Pain Loc      Pain Education      Exclude from Growth Chart     Most recent vital signs: Vitals:   03/23/23 0530 03/23/23 0600  BP: 122/74 124/79  Pulse: 73 70  Resp: 14 12  Temp:  98.5 F (36.9 C)  SpO2: 95% 97%     General: Awake, no distress.  CV:  Good peripheral perfusion.  Normal tones and rate Resp:  Normal effort.  Diminished lung sounds bilaterally, no obvious Rales.  No wheezing.  Normal work of breathing Abd:  No distention.  Appears to have some edema of the abdominal wall, also on skin exam appears to have mild edema in the hands bilaterally as well as both lower stump sites.  The right stump has mild erythema that blanches, but not warm to touch or tender Other:     ED Results / Procedures / Treatments   Labs (all labs ordered are listed, but only abnormal results are displayed) Labs Reviewed  COMPREHENSIVE METABOLIC PANEL - Abnormal; Notable for the following components:      Result Value   Potassium 2.8 (*)    Calcium 8.8  (*)    Total Protein 6.3 (*)    Total Bilirubin 1.3 (*)    All other components within normal limits  CBC - Abnormal; Notable for the following components:   RBC 3.16 (*)    Hemoglobin 10.4 (*)    HCT 32.8 (*)    MCV 103.8 (*)    All other components within normal limits  BRAIN NATRIURETIC PEPTIDE - Abnormal; Notable for the following components:   B Natriuretic Peptide 140.0 (*)    All other components within normal limits  D-DIMER, QUANTITATIVE - Abnormal; Notable for the following components:   D-Dimer, Quant 1.87 (*)    All other components within normal limits  PROTIME-INR - Abnormal; Notable for the following components:   Prothrombin Time 16.1 (*)    INR 1.3 (*)    All other components within normal limits  RESP PANEL BY RT-PCR (RSV, FLU A&B, COVID)  [id number]  APTT   Labs show a mildly elevated BNP, D-dimer  is elevated but this is expected  RADIOLOGY  Chest x-ray technically difficult to read due to underpenetration.  Suspect vascular congestion  DG Chest 2 View Result Date: 03/23/2023 CLINICAL DATA:  68 year old male with history of dyspnea. Swelling in bilateral legs. EXAM: CHEST - 2 VIEW COMPARISON:  Chest x-ray 03/06/2023. FINDINGS: Right internal jugular single-lumen power porta cath with tip terminating in the distal superior vena cava. Lung volumes are very low and film is under penetrated limiting the diagnostic sensitivity and specificity of the examination. With these limitations in mind, bibasilar opacities likely reflect subsegmental atelectasis. No definite consolidative airspace disease. No definite pleural effusions. No pneumothorax. Cephalization of the pulmonary vasculature, without frank pulmonary edema. Mild cardiomegaly. Upper mediastinal contours are within normal limits. Orthopedic fixation hardware in the lower cervical spine incidentally noted. IMPRESSION: 1. Low lung volumes with bibasilar subsegmental atelectasis. 2. Mild cardiomegaly with pulmonary venous  congestion, but no frank pulmonary edema. Electronically Signed   By: Trudie Reed M.D.   On: 03/23/2023 05:42   US Venous Img Lower Bilateral (DVT) Result Date: 03/23/2023 CLINICAL DATA:  Left leg swelling, bilateral above knee amputation EXAM: BILATERAL LOWER EXTREMITY VENOUS DOPPLER ULTRASOUND TECHNIQUE: Gray-scale sonography with graded compression, as well as color Doppler and duplex ultrasound were performed to evaluate the lower extremity deep venous systems from the level of the common femoral vein and including the common femoral, femoral, profunda femoral, popliteal and calf veins including the posterior tibial, peroneal and gastrocnemius veins when visible. The superficial great saphenous vein was also interrogated. Spectral Doppler was utilized to evaluate flow at rest and with distal augmentation maneuvers in the common femoral, femoral and popliteal veins. COMPARISON:  03/07/2023 FINDINGS: RIGHT LOWER EXTREMITY Common Femoral Vein: No evidence of thrombus. Normal compressibility, respiratory phasicity and response to augmentation. Saphenofemoral Junction: No evidence of thrombus. Normal compressibility and flow on color Doppler imaging. Profunda Femoral Vein: No evidence of thrombus. Normal compressibility and flow on color Doppler imaging. Femoral Vein: Limited evaluation due to patient's body habitus. Interval development of occlusive thrombus within the visualized proximal segment. The distal segment is not well visualized. Popliteal Vein: Absent Calf Veins: Absent Superficial Great Saphenous Vein: Not well visualized Venous Reflux:  Not assessed Other Findings:  None. LEFT LOWER EXTREMITY Common Femoral Vein: No evidence of thrombus. Normal compressibility, respiratory phasicity and response to augmentation. Saphenofemoral Junction: No evidence of thrombus. Normal compressibility and flow on color Doppler imaging. Profunda Femoral Vein: Occlusive thrombus within the visualized segment,  similar prior examination Femoral Vein: Limited visualization due to the patient's body habitus. Persistent occlusive thrombus within the visualized proximal and the mid segment. Popliteal Vein: Absent Calf Veins: Absent Superficial Great Saphenous Vein: Not well assessed on this examination Venous Reflux:  Not assessed Other Findings:  None. IMPRESSION: 1. Interval development of occlusive thrombus within the visualized proximal segment of the right femoral vein. 2. Persistent occlusive thrombus within the visualized segment of the left profunda femoral vein and proximal and mid segment of the left femoral vein. Electronically Signed   By: Helyn Numbers M.D.   On: 03/23/2023 04:50      PROCEDURES:  Critical Care performed: No  Procedures   MEDICATIONS ORDERED IN ED: Medications  potassium chloride SA (KLOR-CON M) CR tablet 40 mEq (40 mEq Oral Given 03/23/23 0515)     IMPRESSION / MDM / ASSESSMENT AND PLAN / ED COURSE  I reviewed the triage vital signs and the nursing notes.  Differential diagnosis includes, but is not limited to, anasarca, CHF, other causes of acute edema, hypervolemic state, less likely acute thromboembolism of the chest/PE.  He is presently anticoagulated on Coumadin, he does have a extensive history of DVTs but is anticoagulated at this time, additionally he has edema of his scrotum both lower legs both hands and across the abdomen more suggestive of a picture that would not be consistent with PE.  He has no associated chest pain.  Other things on differential would include chronic thromboembolic pulmonary hypertension, however given his lack of dyspnea, tachycardia, chest pain or hypoxia I do not have any clinical findings would be highly suggestive of acute PE at this time and I will start him on heparin for VTE treatment nonetheless  D-dimer is elevated but this is in the known setting of DVT.  In review of documentation it appears the  patient has gained approximately 6 kg in the last 12 days.  Patient's presentation is most consistent with acute complicated illness / injury requiring diagnostic workup.  Labs reveal elevated BNP.  He is also had 6 kg of weight gain.  He appears to have significant anasarca with symptoms.  He has no known history of CHF, and given his complicated recent medical history as well as chemotherapy, I think he would likely benefit from hospitalization for further workup and consideration for diuresis, echocardiogram and further testing as needed.  Given the degree of edema, rapidity of gain, I suspect that he will likely fail outpatient treatment.  I have placed a consult with and patient will be seen and evaluated by Dr. Evie Lacks vascular surgery, her current recommendation is to start heparin which I have ordered.    Consulted with and patient accepted to hospital service by Dr. Para March.  Patient agreeable with plan for admission        FINAL CLINICAL IMPRESSION(S) / ED DIAGNOSES   Final diagnoses:  Acute deep vein thrombosis (DVT) of femoral vein of right lower extremity (HCC)     Rx / DC Orders   ED Discharge Orders     None        Note:  This document was prepared using Dragon voice recognition software and may include unintentional dictation errors.   Sharyn Creamer, MD 03/23/23 [phone]

## 2023-03-24 DIAGNOSIS — I824Y1 Acute embolism and thrombosis of unspecified deep veins of right proximal lower extremity: Secondary | ICD-10-CM | POA: Diagnosis not present

## 2023-03-24 LAB — COMPREHENSIVE METABOLIC PANEL
ALT: 15 U/L (ref 0–44)
AST: 25 U/L (ref 15–41)
Albumin: 3.2 g/dL — ABNORMAL LOW (ref 3.5–5.0)
Alkaline Phosphatase: 82 U/L (ref 38–126)
Anion gap: 9 (ref 5–15)
BUN: 11 mg/dL (ref 8–23)
CO2: 26 mmol/L (ref 22–32)
Calcium: 8.6 mg/dL — ABNORMAL LOW (ref 8.9–10.3)
Chloride: 104 mmol/L (ref 98–111)
Creatinine, Ser: 0.7 mg/dL (ref 0.61–1.24)
GFR, Estimated: >60 mL/min (ref >60)
Glucose, Bld: 100 mg/dL — ABNORMAL HIGH (ref 70–99)
Potassium: 2.4 mmol/L — CL (ref 3.5–5.1)
Sodium: 139 mmol/L (ref 135–145)
Total Bilirubin: 1.5 mg/dL — ABNORMAL HIGH (ref 0.0–1.2)
Total Protein: 5.6 g/dL — ABNORMAL LOW (ref 6.5–8.1)

## 2023-03-24 LAB — CBC
HCT: 29.8 % — ABNORMAL LOW (ref 39.0–52.0)
Hemoglobin: 9.8 g/dL — ABNORMAL LOW (ref 13.0–17.0)
MCH: 33.7 pg (ref 26.0–34.0)
MCHC: 32.9 g/dL (ref 30.0–36.0)
MCV: 102.4 fL — ABNORMAL HIGH (ref 80.0–100.0)
Platelets: 193 10*3/uL (ref 150–400)
RBC: 2.91 MIL/uL — ABNORMAL LOW (ref 4.22–5.81)
RDW: 14.2 % (ref 11.5–15.5)
WBC: 3.4 10*3/uL — ABNORMAL LOW (ref 4.0–10.5)
nRBC: 0 % (ref 0.0–0.2)

## 2023-03-24 LAB — BASIC METABOLIC PANEL
Anion gap: 6 (ref 5–15)
BUN: 10 mg/dL (ref 8–23)
CO2: 27 mmol/L (ref 22–32)
Calcium: 8.8 mg/dL — ABNORMAL LOW (ref 8.9–10.3)
Chloride: 105 mmol/L (ref 98–111)
Creatinine, Ser: 0.6 mg/dL — ABNORMAL LOW (ref 0.61–1.24)
GFR, Estimated: >60 mL/min (ref >60)
Glucose, Bld: 101 mg/dL — ABNORMAL HIGH (ref 70–99)
Potassium: 2.9 mmol/L — ABNORMAL LOW (ref 3.5–5.1)
Sodium: 138 mmol/L (ref 135–145)

## 2023-03-24 LAB — HEPARIN LEVEL (UNFRACTIONATED)
Heparin Unfractionated: 0.51 [IU]/mL (ref 0.30–0.70)
Heparin Unfractionated: 0.61 [IU]/mL (ref 0.30–0.70)

## 2023-03-24 LAB — MAGNESIUM
Magnesium: 1.9 mg/dL (ref 1.7–2.4)
Magnesium: 1.7 mg/dL (ref 1.7–2.4)

## 2023-03-24 MED ORDER — POTASSIUM CHLORIDE CRYS ER 20 MEQ PO TBCR: 40.0000 meq | EXTENDED_RELEASE_TABLET | Freq: Once | ORAL | Status: DC

## 2023-03-24 MED ORDER — FUROSEMIDE 40 MG PO TABS: 40.0000 mg | ORAL_TABLET | Freq: Every day | ORAL | Status: DC

## 2023-03-24 MED ORDER — POTASSIUM CHLORIDE 10 MEQ/100ML IV SOLN
10.0000 meq | INTRAVENOUS | Status: AC
  Administered 2023-03-24 (×3): 10 meq via INTRAVENOUS
  Filled 2023-03-24 (×3): qty 100

## 2023-03-24 MED ORDER — POTASSIUM CHLORIDE 10 MEQ/100ML IV SOLN
INTRAVENOUS | Status: AC
  Administered 2023-03-24: 10 meq via INTRAVENOUS
  Filled 2023-03-24: qty 100

## 2023-03-24 MED ORDER — DAROLUTAMIDE 300 MG PO TABS: 600.0000 mg | ORAL_TABLET | Freq: Two times a day (BID) | ORAL | Status: DC

## 2023-03-24 MED ORDER — OXYCODONE HCL 5 MG PO TABS
5.0000 mg | ORAL_TABLET | Freq: Once | ORAL | Status: AC
Start: 2023-03-24 — End: 2023-03-24
  Administered 2023-03-24: 5 mg via ORAL
  Filled 2023-03-24: qty 1

## 2023-03-24 MED ORDER — POTASSIUM CHLORIDE CRYS ER 20 MEQ PO TBCR
40.0000 meq | EXTENDED_RELEASE_TABLET | Freq: Once | ORAL | Status: AC
  Administered 2023-03-24: 40 meq via ORAL
  Filled 2023-03-24: qty 2

## 2023-03-24 MED ORDER — MAGNESIUM SULFATE 2 GM/50ML IV SOLN
2.0000 g | Freq: Once | INTRAVENOUS | Status: AC
  Administered 2023-03-24: 2 g via INTRAVENOUS
  Filled 2023-03-24: qty 50

## 2023-03-24 MED ORDER — WARFARIN - PHARMACIST DOSING INPATIENT
Freq: Every day | Status: DC
Start: 2023-03-25 — End: 2023-04-05
  Filled 2023-03-24: qty 1

## 2023-03-24 MED ORDER — WARFARIN SODIUM 7.5 MG PO TABS
7.5000 mg | ORAL_TABLET | Freq: Once | ORAL | Status: AC
  Administered 2023-03-24: 7.5 mg via ORAL
  Filled 2023-03-24: qty 1

## 2023-03-24 MED ORDER — BUPROPION HCL ER (XL) 150 MG PO TB24
300.0000 mg | ORAL_TABLET | Freq: Every day | ORAL | Status: DC
  Administered 2023-03-24 – 2023-04-05 (×13): 300 mg via ORAL
  Filled 2023-03-24 (×13): qty 2

## 2023-03-24 MED ORDER — LORAZEPAM 0.5 MG PO TABS
0.5000 mg | ORAL_TABLET | Freq: Once | ORAL | Status: AC
Start: 2023-03-24 — End: 2023-03-24
  Administered 2023-03-24: 0.5 mg via ORAL
  Filled 2023-03-24: qty 1

## 2023-03-24 MED ORDER — FUROSEMIDE 10 MG/ML IJ SOLN
40.0000 mg | Freq: Every day | INTRAMUSCULAR | Status: DC
  Administered 2023-03-24 – 2023-03-27 (×4): 40 mg via INTRAVENOUS
  Filled 2023-03-24 (×4): qty 4

## 2023-03-24 MED ORDER — OXYCODONE-ACETAMINOPHEN 5-325 MG PO TABS
1.0000 | ORAL_TABLET | Freq: Two times a day (BID) | ORAL | Status: DC | PRN
  Administered 2023-03-24 – 2023-03-31 (×6): 1 via ORAL
  Filled 2023-03-24 (×6): qty 1

## 2023-03-24 MED ORDER — ENOXAPARIN SODIUM 150 MG/ML IJ SOSY
1.0000 mg/kg | PREFILLED_SYRINGE | Freq: Two times a day (BID) | INTRAMUSCULAR | Status: DC
  Administered 2023-03-24 – 2023-03-30 (×14): 135 mg via SUBCUTANEOUS
  Filled 2023-03-24 (×16): qty 0.9

## 2023-03-24 NOTE — Progress Notes (Signed)
PHARMACY - ANTICOAGULATION CONSULT NOTE  Pharmacy Consult for Heparin  Indication: DVT  Allergies  Allergen Reactions   Bacitracin Hives   Vancomycin Other (See Comments) and Rash    Red man syndrome.   Ketorolac     Other Reaction(s): Kidney Disorder  Other reaction(s): Kidney Disorder   Spironolactone     Other Reaction(s): Unknown  Other reaction(s): Unknown    Patient Measurements: Height: 4\' 10"  (147.3 cm) Weight: 136.1 kg (300 lb) IBW/kg (Calculated) : 45.4 Heparin Dosing Weight: 78.7 kg  Vital Signs: Temp: 98.9 F (37.2 C) (01/19 0516) Temp Source: Oral (01/19 0516) BP: 99/56 (01/19 0445) Pulse Rate: 65 (01/19 0445)  Labs: Recent Labs    03/22/23 2201 03/23/23 0636 03/23/23 1611 03/24/23 0035 03/24/23 0518  HGB 10.4*  --   --   --  9.8*  HCT 32.8*  --   --   --  29.8*  PLT 230  --   --   --  193  APTT  --  34  --   --   --   LABPROT  --  16.1*  --   --   --   INR  --  1.3*  --   --   --   HEPARINUNFRC  --   --  1.04* 0.61 0.51  CREATININE 0.87  --   --   --  0.70    Estimated Creatinine Clearance: 103.5 mL/min (by C-G formula based on SCr of 0.7 mg/dL).   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 Patient was on  warfarin prior to admission w/ INR 1.3  01/18 1611 HL 1.04  SUPRAtherapeutic 01/19 0035 HL 0.61  Therapeutic X 1  01/19 0518 HL 0.51  Therapeutic X 2    Per RN, level was drawn  appropriately. No signs/symptoms of bleeding noted.  Goal of Therapy:  Heparin level 0.3-0.7 units/ml  Monitor platelets by anticoagulation protocol: Yes   Plan:  1/19 0518 HL 0.51, therapeutic X 2 - Will continue pt on current rate and recheck HL on 1/20 with AM labs.  Continue to monitor H&H and platelets  Thank you for involving pharmacy in this patient's care.   Rudransh Bellanca D Clinical Pharmacist 03/24/2023 6:28 AM

## 2023-03-24 NOTE — ED Notes (Addendum)
Hospitalist at bedside 

## 2023-03-24 NOTE — ED Notes (Signed)
Lab called critical Potassium of 2.4.  B Morrison NP notified.

## 2023-03-24 NOTE — Progress Notes (Signed)
       CROSS COVER NOTE  NAME: Silvio Helsley MRN: [id number] DOB : 1955-04-26 ATTENDING PHYSICIAN: Lucile Shutters, MD    Date of Service   03/24/2023   HPI/Events of Note   K 2.4  Interventions   Assessment/Plan: 40 meq oral K and 40 IV Mag level added to blod in lab        Donnie Mesa NP [facility] Cross Cover 7pm-7am - check amion for availability Pager [phone]

## 2023-03-24 NOTE — ED Notes (Signed)
Pharmacy notified to send Lovenox injection; advised to leave Heparin gtt running until we received Lovenox dose.  States they will send up dose ASAP.

## 2023-03-24 NOTE — Consult Note (Signed)
PHARMACY - ANTICOAGULATION CONSULT NOTE  Pharmacy Consult for warfarin & enoxaparin Indication: DVT  Allergies  Allergen Reactions   Bacitracin Hives   Vancomycin Other (See Comments) and Rash    Red man syndrome.   Ketorolac     Other Reaction(s): Kidney Disorder  Other reaction(s): Kidney Disorder   Spironolactone     Other Reaction(s): Unknown  Other reaction(s): Unknown    Patient Measurements: Height: 4\' 10"  (147.3 cm) Weight: 136.1 kg (300 lb) IBW/kg (Calculated) : 45.4  Vital Signs: Temp: 98.9 F (37.2 C) (01/19 0945) Temp Source: Oral (01/19 0945) BP: 129/76 (01/19 1130) Pulse Rate: 74 (01/19 1130)  Labs: Recent Labs    03/22/23 2201 03/23/23 0636 03/23/23 1611 03/24/23 0035 03/24/23 0518  HGB 10.4*  --   --   --  9.8*  HCT 32.8*  --   --   --  29.8*  PLT 230  --   --   --  193  APTT  --  34  --   --   --   LABPROT  --  16.1*  --   --   --   INR  --  1.3*  --   --   --   HEPARINUNFRC  --   --  1.04* 0.61 0.51  CREATININE 0.87  --   --   --  0.70    Estimated Creatinine Clearance: 103.5 mL/min (by C-G formula based on SCr of 0.7 mg/dL).   Medical History: Past Medical History:  Diagnosis Date   Depression    History of blood clots 08/13/2011   History of left knee replacement    HTN (hypertension)    Lymphedema    lt leg-per pt   PE (pulmonary thromboembolism) (HCC)    Pressure injury of skin, unspecified injury stage, unspecified location    S/P AKA (above knee amputation) bilateral (HCC)    rt 11/24/07 and lt 01/11/17    Assessment: PMH significant of stage IV prostate cancer, recurrent DVT and PE, lymphedema, hypertension, status post AKA bilaterally, pressure ulcers. Patient presents with recurrent DVT despite eliquis use. INR sub-therapeutic on admission and sub-therapeutic on last outpatient check as well.   INR 1.2 on 03/19/2023 after 2 weeks on warfarin 5mg  daily. Per note review - warfarin dose was changed to 7.5 mg three times a  week and 5 mg on the other four days.  INR on admission 1.3 (03/23/2023). Therapoeutic enoxaparin 1mg /kg q 12hr initiated as well.  Goal of Therapy:  INR 2-3 Monitor platelets by anticoagulation protocol: Yes   Plan:  Warfarin 7.5mg  po today Continue enoxaparin 1mg /kg q 12hrs until INR >2 fro 24 hrs Daily INR for now CBC at least every 72 hrs   [name] PharmD, BCPS 03/24/2023 4:08 PM

## 2023-03-24 NOTE — Care Management CC44 (Signed)
Condition Code 44 Documentation Completed  Patient Details  Name: [name] MRN: [id number] Date of Birth: June 01, 1955   Condition Code 44 given:  Yes Patient signature on Condition Code 44 notice:  Yes Documentation of 2 MD's agreement:  Yes Code 44 added to claim:  Yes    Reuel Boom Alexio Sroka, LCSW 03/24/2023, 3:16 PM

## 2023-03-24 NOTE — ED Notes (Signed)
Potassium of 2.4 critical called per lab.  EDP [place] notified.

## 2023-03-24 NOTE — Progress Notes (Signed)
Progress Note   Patient: [name] QIO:[id number] DOB: 03/02/1956 DOA: 03/23/2023     1 DOS: the patient was seen and examined on 03/24/2023   Brief hospital course:  [name] is a 68 y.o. male with medical history significant of stage IV prostate cancer, recurrent DVT and PE, lymphedema, hypertension, status post AKA bilaterally, pressure ulcers presenting with recurrent DVT.  Patient noted to have been admitted January 2 January 6 for acute left-sided DVT.  Had DVT recurrence despite Eliquis use.  Was transition from heparin to Lovenox at discharge.  Patient states he was unable to afford it and was subsequently transition to Coumadin by PCP.  Coumadin Level has been subtherapeutic per report.  Has had worsening shortness of breath lower extremity swelling and pain x 2 to 3 days.  Noted baseline bilateral AKA.  Mild orthopnea.  No chest pain.  No fevers or chills.  Baseline stage IV prostate cancer.  Followed by Dr. Donneta Romberg.  Non-smoker. Presented to the ER afebrile, hemodynamically stable.  Satting well on room air.  White count 4.4, hemoglobin 10.4, platelets 230, INR 1.3, COVID flu and RSV negative.  Creatinine 0.87.  BNP 140.  Chest x-ray with cardiomegaly.  Lower extremity ultrasound with occlusive thrombus in the proximal segment of right femoral vein.  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 Review of Systems: As mentioned in the history of present illness. All other systems reviewed and are negative.     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 Continue scheduled Coumadin Chest CT is negative for PE       Acute on chronic diastolic CHF Patient with complaints of fluid overload, excessive weight gain in both thighs and scrotum Progressive shortness of breath and generalized swelling in the setting of active malignancy and recurrent DVT Positive cardiomegaly on chest x-ray BNP 140 2D echo shows an LVEF of 55 - 60%. Normal RV size and function Concern for possible Cor Pulmonale related to recurrent VTE Start patient on IV diuretic therapy Resume Lisinopril in am if BP is stable   Hypokalemia Severe Secondary to GI losses from diarrhea Supplement potassium  Check magnesium levels    Prostate cancer metastatic to bone (HCC) Baseline stage 4 metastatic prostate with bone involvement  Followed by Dr. Donneta Romberg  Cont Merleen Nicely      Essential hypertension BP stable    Diarrhea Stable Continue Lomotil   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 Chronic due to poor ambulation Wound care consult   Anxiety/Depression Continue Bupropion    Morbid Obesity BMI 62 Patient is s/p bilateral AKA Wheel chair bound    Ascending thoracic aneurysm Measures 5cm Patient aware of aneurysm but has not seen vascular surgery in years Consult vascular surgery      Subjective: Complains of significant weight gain and swelling  Physical Exam: Vitals:   03/24/23 0700 03/24/23 0830 03/24/23 0945 03/24/23 1130  BP: (!) 142/99 (!) 155/66 116/75 129/76  Pulse: 73 69 64 74  Resp: 20 14 19 14   Temp:   98.9 F (37.2 C)   TempSrc:  Oral   SpO2: 96% 94% 95% 93%  Weight:      Height:       Constitutional:      Appearance: He is obese.  HENT:     Head: Normocephalic and atraumatic.     Nose: Nose normal.     Mouth/Throat:     Mouth: Mucous membranes are moist.  Eyes:     Pupils: Pupils are equal, round, and reactive to light.  Cardiovascular:     Rate  and Rhythm: Normal rate and regular rhythm.  Pulmonary:     Effort: Pulmonary effort is normal.  Abdominal:     General: Bowel sounds are normal.  Musculoskeletal:     Right lower leg: Edema present.     Left lower leg: Edema present.     Comments: S/p bilateral AKA   Neurological:     General: No focal deficit present.  Psychiatric:        Mood and Affect: Mood normal.        Data Reviewed: Labs reviewed. K 2.4 There are no new results to review at this time.  Family Communication: Plan of care was discussed with patient in detail  Disposition: Status is: Observation The patient remains OBS appropriate and will d/c before 2 midnights.  Planned Discharge Destination: Home    Time spent: 40 minutes  Author: Lucile Shutters, MD 03/24/2023 3:25 PM  For on call review [email or web address].

## 2023-03-24 NOTE — Progress Notes (Signed)
PHARMACY - ANTICOAGULATION CONSULT NOTE  Pharmacy Consult for Heparin  Indication: DVT  Allergies  Allergen Reactions   Bacitracin Hives   Vancomycin Other (See Comments) and Rash    Red man syndrome.   Ketorolac     Other Reaction(s): Kidney Disorder  Other reaction(s): Kidney Disorder   Spironolactone     Other Reaction(s): Unknown  Other reaction(s): Unknown    Patient Measurements: Height: 4\' 10"  (147.3 cm) Weight: 136.1 kg (300 lb) IBW/kg (Calculated) : 45.4 Heparin Dosing Weight: 78.7 kg  Vital Signs: Temp: 99.2 F (37.3 C) (01/19 0040) Temp Source: Oral (01/19 0040) BP: 105/63 (01/19 0036) Pulse Rate: 73 (01/19 0036)  Labs: Recent Labs    03/22/23 2201 03/23/23 0636 03/23/23 1611 03/24/23 0035  HGB 10.4*  --   --   --   HCT 32.8*  --   --   --   PLT 230  --   --   --   APTT  --  34  --   --   LABPROT  --  16.1*  --   --   INR  --  1.3*  --   --   HEPARINUNFRC  --   --  1.04* 0.61  CREATININE 0.87  --   --   --     Estimated Creatinine Clearance: 95.2 mL/min (by C-G formula based on SCr of 0.87 mg/dL).   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 Patient was on  warfarin prior to admission w/ INR 1.3  01/18 1611 HL 1.04  SUPRAtherapeutic 01/19 0035 HL 0.61  Therapeutic X 1   Per RN, level was drawn appropriately. No signs/symptoms of bleeding noted.  Goal of Therapy:  Heparin level 0.3-0.7  units/ml  Monitor platelets by anticoagulation protocol: Yes   Plan:  1/19:  HL @ 0035 = 0.61, therapeutic X 1 - Will continue pt on current rate and recheck HL on 1/19 @ 0700.  Continue to monitor H&H and platelets  Thank you for involving pharmacy in this patient's care.   Rhiannan Kievit D Clinical Pharmacist 03/24/2023 12:58 AM

## 2023-03-24 NOTE — Care Management Obs Status (Incomplete)
MEDICARE OBSERVATION STATUS NOTIFICATION   Patient Details  Name: [name] MRN: [id number] Date of Birth: 05-30-1955   Medicare Observation Status Notification Given:       Maree Krabbe, LCSW 03/24/2023, 2:54 PM

## 2023-03-25 ENCOUNTER — Encounter: Payer: Self-pay | Admitting: Internal Medicine

## 2023-03-25 DIAGNOSIS — R197 Diarrhea, unspecified: Secondary | ICD-10-CM | POA: Diagnosis not present

## 2023-03-25 DIAGNOSIS — Z89611 Acquired absence of right leg above knee: Secondary | ICD-10-CM | POA: Diagnosis not present

## 2023-03-25 DIAGNOSIS — I824Y1 Acute embolism and thrombosis of unspecified deep veins of right proximal lower extremity: Secondary | ICD-10-CM | POA: Diagnosis not present

## 2023-03-25 DIAGNOSIS — K5981 Ogilvie syndrome: Secondary | ICD-10-CM | POA: Diagnosis present

## 2023-03-25 DIAGNOSIS — E878 Other disorders of electrolyte and fluid balance, not elsewhere classified: Secondary | ICD-10-CM | POA: Diagnosis not present

## 2023-03-25 DIAGNOSIS — R140 Abdominal distension (gaseous): Secondary | ICD-10-CM | POA: Diagnosis not present

## 2023-03-25 DIAGNOSIS — I82413 Acute embolism and thrombosis of femoral vein, bilateral: Secondary | ICD-10-CM | POA: Diagnosis present

## 2023-03-25 DIAGNOSIS — L89893 Pressure ulcer of other site, stage 3: Secondary | ICD-10-CM | POA: Diagnosis present

## 2023-03-25 DIAGNOSIS — G4730 Sleep apnea, unspecified: Secondary | ICD-10-CM | POA: Diagnosis present

## 2023-03-25 DIAGNOSIS — I712 Thoracic aortic aneurysm, without rupture, unspecified: Secondary | ICD-10-CM | POA: Diagnosis present

## 2023-03-25 DIAGNOSIS — K5900 Constipation, unspecified: Secondary | ICD-10-CM | POA: Diagnosis not present

## 2023-03-25 DIAGNOSIS — Z89612 Acquired absence of left leg above knee: Secondary | ICD-10-CM | POA: Diagnosis not present

## 2023-03-25 DIAGNOSIS — K5641 Fecal impaction: Secondary | ICD-10-CM | POA: Diagnosis not present

## 2023-03-25 DIAGNOSIS — C61 Malignant neoplasm of prostate: Secondary | ICD-10-CM | POA: Diagnosis not present

## 2023-03-25 DIAGNOSIS — T502X5A Adverse effect of carbonic-anhydrase inhibitors, benzothiadiazides and other diuretics, initial encounter: Secondary | ICD-10-CM | POA: Diagnosis present

## 2023-03-25 DIAGNOSIS — Z6841 Body Mass Index (BMI) 40.0 and over, adult: Secondary | ICD-10-CM | POA: Diagnosis not present

## 2023-03-25 DIAGNOSIS — R0602 Shortness of breath: Secondary | ICD-10-CM | POA: Diagnosis not present

## 2023-03-25 DIAGNOSIS — I110 Hypertensive heart disease with heart failure: Secondary | ICD-10-CM | POA: Diagnosis present

## 2023-03-25 DIAGNOSIS — I82409 Acute embolism and thrombosis of unspecified deep veins of unspecified lower extremity: Secondary | ICD-10-CM | POA: Diagnosis present

## 2023-03-25 DIAGNOSIS — Z79899 Other long term (current) drug therapy: Secondary | ICD-10-CM | POA: Diagnosis not present

## 2023-03-25 DIAGNOSIS — I5033 Acute on chronic diastolic (congestive) heart failure: Secondary | ICD-10-CM | POA: Diagnosis present

## 2023-03-25 DIAGNOSIS — C7951 Secondary malignant neoplasm of bone: Secondary | ICD-10-CM | POA: Diagnosis present

## 2023-03-25 DIAGNOSIS — I82403 Acute embolism and thrombosis of unspecified deep veins of lower extremity, bilateral: Secondary | ICD-10-CM | POA: Diagnosis not present

## 2023-03-25 DIAGNOSIS — I10 Essential (primary) hypertension: Secondary | ICD-10-CM | POA: Diagnosis not present

## 2023-03-25 DIAGNOSIS — F32A Depression, unspecified: Secondary | ICD-10-CM | POA: Diagnosis present

## 2023-03-25 DIAGNOSIS — K567 Ileus, unspecified: Secondary | ICD-10-CM | POA: Diagnosis not present

## 2023-03-25 DIAGNOSIS — F419 Anxiety disorder, unspecified: Secondary | ICD-10-CM | POA: Diagnosis present

## 2023-03-25 DIAGNOSIS — L89322 Pressure ulcer of left buttock, stage 2: Secondary | ICD-10-CM | POA: Diagnosis present

## 2023-03-25 DIAGNOSIS — I739 Peripheral vascular disease, unspecified: Secondary | ICD-10-CM | POA: Diagnosis present

## 2023-03-25 DIAGNOSIS — Z1152 Encounter for screening for COVID-19: Secondary | ICD-10-CM | POA: Diagnosis not present

## 2023-03-25 DIAGNOSIS — Z7901 Long term (current) use of anticoagulants: Secondary | ICD-10-CM | POA: Diagnosis not present

## 2023-03-25 DIAGNOSIS — E876 Hypokalemia: Secondary | ICD-10-CM | POA: Diagnosis present

## 2023-03-25 DIAGNOSIS — K5939 Other megacolon: Secondary | ICD-10-CM | POA: Diagnosis present

## 2023-03-25 LAB — CBC
HCT: 31.5 % — ABNORMAL LOW (ref 39.0–52.0)
Hemoglobin: 10.3 g/dL — ABNORMAL LOW (ref 13.0–17.0)
MCH: 32.9 pg (ref 26.0–34.0)
MCHC: 32.7 g/dL (ref 30.0–36.0)
MCV: 100.6 fL — ABNORMAL HIGH (ref 80.0–100.0)
Platelets: 195 10*3/uL (ref 150–400)
RBC: 3.13 MIL/uL — ABNORMAL LOW (ref 4.22–5.81)
RDW: 14 % (ref 11.5–15.5)
WBC: 3.3 10*3/uL — ABNORMAL LOW (ref 4.0–10.5)
nRBC: 0 % (ref 0.0–0.2)

## 2023-03-25 LAB — PROTIME-INR
INR: 1.3 — ABNORMAL HIGH (ref 0.8–1.2)
Prothrombin Time: 16.2 s — ABNORMAL HIGH (ref 11.4–15.2)

## 2023-03-25 LAB — BASIC METABOLIC PANEL
Anion gap: 7 (ref 5–15)
BUN: 8 mg/dL (ref 8–23)
CO2: 28 mmol/L (ref 22–32)
Calcium: 8.8 mg/dL — ABNORMAL LOW (ref 8.9–10.3)
Chloride: 104 mmol/L (ref 98–111)
Creatinine, Ser: 0.64 mg/dL (ref 0.61–1.24)
GFR, Estimated: >60 mL/min (ref >60)
Glucose, Bld: 115 mg/dL — ABNORMAL HIGH (ref 70–99)
Potassium: 2.7 mmol/L — CL (ref 3.5–5.1)
Sodium: 139 mmol/L (ref 135–145)

## 2023-03-25 LAB — MAGNESIUM: Magnesium: 1.8 mg/dL (ref 1.7–2.4)

## 2023-03-25 MED ORDER — MAGNESIUM SULFATE 2 GM/50ML IV SOLN
2.0000 g | Freq: Once | INTRAVENOUS | Status: AC
  Administered 2023-03-25: 2 g via INTRAVENOUS
  Filled 2023-03-25: qty 50

## 2023-03-25 MED ORDER — POTASSIUM CHLORIDE CRYS ER 20 MEQ PO TBCR
40.0000 meq | EXTENDED_RELEASE_TABLET | Freq: Once | ORAL | Status: AC
  Administered 2023-03-25: 40 meq via ORAL
  Filled 2023-03-25: qty 2

## 2023-03-25 MED ORDER — DIPHENOXYLATE-ATROPINE 2.5-0.025 MG PO TABS: 1.0000 | ORAL_TABLET | Freq: Four times a day (QID) | ORAL | Status: DC

## 2023-03-25 MED ORDER — DIPHENOXYLATE-ATROPINE 2.5-0.025 MG PO TABS
1.0000 | ORAL_TABLET | Freq: Four times a day (QID) | ORAL | Status: AC
  Administered 2023-03-25 (×2): 1 via ORAL
  Filled 2023-03-25 (×2): qty 1

## 2023-03-25 MED ORDER — WARFARIN SODIUM 10 MG PO TABS
10.0000 mg | ORAL_TABLET | Freq: Once | ORAL | Status: AC
  Administered 2023-03-25: 10 mg via ORAL
  Filled 2023-03-25 (×2): qty 1

## 2023-03-25 MED ORDER — MEDIHONEY WOUND/BURN DRESSING EX PSTE
1.0000 | PASTE | Freq: Every day | CUTANEOUS | Status: DC
  Administered 2023-03-26 – 2023-04-05 (×11): 1 via TOPICAL
  Filled 2023-03-25: qty 44

## 2023-03-25 MED ORDER — GERHARDT'S BUTT CREAM
TOPICAL_CREAM | Freq: Three times a day (TID) | CUTANEOUS | Status: DC
  Administered 2023-04-02 (×2): 1 via TOPICAL
  Filled 2023-03-25 (×4): qty 60

## 2023-03-25 NOTE — ED Notes (Signed)
Pt helped to bedpan. Peri care provided. Pt resting comfortably, denies complaints at this time.

## 2023-03-25 NOTE — ED Notes (Addendum)
1610: Notification to Geradine Girt, NP pt came in with a recurrent dvt was on heparin. Heparin was dc'd during the day sometime. Pt has been having diarrhea and is getting lomotil q4 PRN. He just had a BM that was just mucus, had no color to it. He said that the same thing happened when he was seen here a couple of weeks ago and they tested his stool with no definitive results. Would you like for me to send another sample?   9604: Response from Geradine Girt, NP No stool study at this time (will defer to the MD). He can get another dose of the Lomotil if needed.

## 2023-03-25 NOTE — ED Notes (Signed)
Lab made aware of Mag add-on.

## 2023-03-25 NOTE — Consult Note (Addendum)
WOC Nurse Consult Note: Reason for Consult: sacral wound  Wound type: 1.  Moisture Associated Skin Damage to upper medial buttocks. 2. Intertriginous dermatitis groin, inner thighs  3.  Stage 3 pressure injury L ischium  Pressure Injury POA: Yes Measurement: 1. Widespread erythema with scattered partial thickness skin loss medial buttocks from moisture and friction  2.  Widespread erythema to inner thighs and scrotum from moisture and friction; one area of partial thickness skin loss L inner groin approximately 2 cm x 2 cm in size 100% pink moist  ICD-10 CM Codes for Irritant Dermatitis  L24A2 - Due to fecal, urinary or dual incontinence L30.4  - Erythema intertrigo. Also used for abrasion of the hand, chafing of the skin, dermatitis due to sweating and friction, friction dermatitis, friction eczema, and genital/thigh intertrigo.  3. L ischium Stage 3 Pressure Injury 1 cm x  1 cm x 0.1 cm 50% pink moist 50% yellow  Wound bed: as above  Drainage (amount, consistency, odor) moderate tan exudate from L ischial wound  Periwound: erythema and edema, patient fluid overloaded with scrotal and upper thigh edema  Dressing procedure/placement/frequency:  Cleanse sacrum/scrotum/inner thighs with Vashe wound cleanser Hart Rochester [phone]), apply Gerhardt's Butt Cream to entire area 3 times a day and prn soiling.  Sprinkle over Gerhardt's with floor stock antifungal powder (microguard green and white label) for additional drying and antifungal effects.  Clean L ischial PI with Vashe wound cleanser Hart Rochester [phone]), apply Medihoney to wound bed daily, cover with dry gauze and silicone foam.  Change foam q3 days and prn soiling.   POC discussed with patient and bedside nurse. WOC team will not follow. Re-consult if further needs arise.   Thank you,    Priscella Mann MSN, RN-BC, [employer] [phone]

## 2023-03-25 NOTE — TOC Progression Note (Signed)
Transition of Care [facility]) - Progression Note    Patient Details  Name: [name] MRN: [id number] Date of Birth: May 20, 1955  Transition of Care [facility]) CM/SW Contact  Tory Emerald, [place] Phone Number: 03/25/2023, 11:18 AM  Clinical Narrative:     CSW contacted Kayla at [facility] to inform her pt is currently in the ED.        Expected Discharge Plan and Services                                               Social Determinants of Health (SDOH) Interventions SDOH Screenings   Food Insecurity: No Food Insecurity (03/09/2023)  Housing: Low Risk  (03/09/2023)  Transportation Needs: No Transportation Needs (03/09/2023)  Utilities: Not At Risk (03/09/2023)  Financial Resource Strain: High Risk (07/28/2021)   Received from [facility], [facility]  Physical Activity: Sufficiently Active (07/28/2021)   Received from [facility], [facility]  Social Connections: Socially Isolated (08/01/2021)   Received from [facility], [facility]  Stress: Stress Concern Present (08/01/2021)   Received from [facility], [facility]  Tobacco Use: Medium Risk (03/23/2023)    Readmission Risk Interventions    03/25/2023   10:53 AM  Readmission Risk Prevention Plan  Transportation Screening Complete  PCP or Specialist Appt within 5-7 Days Complete  Home Care Screening Complete  Medication Review (RN CM) Complete

## 2023-03-25 NOTE — Consult Note (Addendum)
PHARMACY - ANTICOAGULATION CONSULT NOTE  Pharmacy Consult for warfarin & enoxaparin Indication: DVT  Allergies  Allergen Reactions   Bacitracin Hives   Vancomycin Other (See Comments) and Rash    Red man syndrome.   Ketorolac     Other Reaction(s): Kidney Disorder  Other reaction(s): Kidney Disorder   Spironolactone     Other Reaction(s): Unknown  Other reaction(s): Unknown   Patient Measurements: Height: 4\' 10"  (147.3 cm) Weight: 136.1 kg (300 lb) IBW/kg (Calculated) : 45.4  Vital Signs: Temp: 97.8 F (36.6 C) (01/20 1008) Temp Source: Oral (01/20 1008) BP: 143/75 (01/20 0830) Pulse Rate: 74 (01/20 1100)  Labs: Recent Labs    03/22/23 2201 03/23/23 0636 03/23/23 1611 03/24/23 0035 03/24/23 0518 03/24/23 1715 03/25/23 0432 03/25/23 1023  HGB 10.4*  --   --   --  9.8*  --  10.3*  --   HCT 32.8*  --   --   --  29.8*  --  31.5*  --   PLT 230  --   --   --  193  --  195  --   APTT  --  34  --   --   --   --   --   --   LABPROT  --  16.1*  --   --   --   --  16.2*  --   INR  --  1.3*  --   --   --   --  1.3*  --   HEPARINUNFRC  --   --  1.04* 0.61 0.51  --   --   --   CREATININE 0.87  --   --   --  0.70 0.60*  --  0.64   Estimated Creatinine Clearance: 103.5 mL/min (by C-G formula based on SCr of 0.64 mg/dL).  Medical History: Past Medical History:  Diagnosis Date   Depression    History of blood clots 08/13/2011   History of left knee replacement    HTN (hypertension)    Lymphedema    lt leg-per pt   PE (pulmonary thromboembolism) (HCC)    Pressure injury of skin, unspecified injury stage, unspecified location    S/P AKA (above knee amputation) bilateral (HCC)    rt 11/24/07 and lt 01/11/17   Warfarin INR 1.2 on 03/19/2023 after 2 weeks on warfarin 5mg  daily. Per note review - warfarin dose was changed to 7.5 mg three times a week and 5 mg on the other four days.  INR on admission 1.3 (03/23/2023). Therapeutic enoxaparin 1mg /kg q 12hr initiated as  well.  Assessment: PMH significant of stage IV prostate cancer, recurrent DVT and PE, lymphedema, hypertension, status post AKA bilaterally, pressure ulcers. Patient presents with recurrent DVT despite eliquis use. INR sub-therapeutic on admission and sub-therapeutic on last outpatient check as well. Pharmacy has been consulted to manage this patient's warfarin.   Goal of Therapy:  INR 2-3 Monitor platelets by anticoagulation protocol: Yes  Warfarin Date@Time  INR Clinical Assessment Warfarin Dose   1/18 1.3 SUBtherapeutic ---  1/19 ---  7.5mg   1/20 1.3 SUB-therapeutic              Plan:  Warfarin 10 mg PO today as boot dose Continue enoxaparin 1mg /kg q 12hrs until INR >2 for 24 hrs Daily INR for now CBC at least every 72 hrs  Effie Shy, PharmD Pharmacy Resident  03/25/2023 12:19 PM

## 2023-03-25 NOTE — Progress Notes (Signed)
   03/25/23 1053  Readmission Prevention Plan - Moderate Risk  Transportation Screening Complete  PCP or Specialist appointment within 5-7 days of discharge Complete  Home Care Screening Complete  Moderate Risk Medication Review Complete   Pt was high risk prior to documentation, so HRRA complete w/ pt at bedside. Pt lives home alone and currently receiving HHS w/ [facility].   Admitted for: Peripheral Vascular d/o  Admitted from: Home w/ HHS  PCP: [facility]  Pharmacy: [facility] Current home health/prior home health/DME: Pt received HHS w/ [facility]. DME: power mobility chair, manual wheelchair, stair lift, Hospital bed  No other TOC noted at this time.

## 2023-03-25 NOTE — Consult Note (Signed)
Hospital Consult    Reason for Consult:  Acute right lower AKA DVT Requesting Physician:   MRN #:  [id number]  History of Present Illness: This is a 68 y.o. male  with medical history significant of stage IV prostate cancer, recurrent DVT and PE, lymphedema, hypertension, status post AKA bilaterally, pressure ulcers presenting with recurrent DVT.  Patient noted to have been admitted January 2 January 6 for acute left-sided DVT.  Had DVT recurrence despite Eliquis use.  Was transitioned from heparin to Lovenox at discharge.  Patient states he was unable to afford it and was subsequently transitioned to Coumadin by himself which he often did when he ran out of his eliquis and was unable to get a refill. Patient was started on Coumadin and his INR today was 1.3 sub therapeutic. Vascular Surgery was consulted to evaluate.   On exam today the patient was resting comfortably in a bed in the emergency room. Bilateral lower extremities are with AKA and +3-4 edema. Also noted to have Scrotal edema. Patient endorses he can not go home as he is unable to take care of himself and his dog with all the swelling. He also endorses having a sacral decubitus making his ability to go home difficult.   Past Medical History:  Diagnosis Date   Depression    History of blood clots 08/13/2011   History of left knee replacement    HTN (hypertension)    Lymphedema    lt leg-per pt   PE (pulmonary thromboembolism) (HCC)    Pressure injury of skin, unspecified injury stage, unspecified location    S/P AKA (above knee amputation) bilateral (HCC)    rt 11/24/07 and lt 01/11/17    Past Surgical History:  Procedure Laterality Date   ACHILLES TENDON SURGERY Left    per pt   bone clip removal Left 10/04/2015   knee   CARDIAC SURGERY  11/2012   cath.-per pt   CERVICAL FUSION  07/28/2015   per pt   EPIGASTRIC HERNIA REPAIR     per pt   hematoma removal Left 11/02/2015   knee   HIP SURGERY Right    x8 per pt    IR IMAGING GUIDED PORT INSERTION  11/14/2022   KNEE FUSION Right    KNEE JOINT MANIPULATION Left 09/11/2011   KNEE SURGERY Left    x3   leg stump Left 03/2017   leg surgery-per pt   NECK SURGERY  02/2015   plates-per pt   REPLACEMENT TOTAL KNEE Right    REPLACEMENT TOTAL KNEE Left 07/02/2011   again in 07/10/2011 per pt   s/p of bilateral AKA Right    screen filter removal  10/20/2008   for embolism per pt   screen filter replacement     x3   SHOULDER SURGERY Right    x2-per pt   SPINE SURGERY  02/11/2015   vertebra spacer-per pt   toe nail removal Right    per pt   VARICOSE VEIN SURGERY     x5-per pt    Allergies  Allergen Reactions   Bacitracin Hives   Vancomycin Other (See Comments) and Rash    Red man syndrome.   Ketorolac     Other Reaction(s): Kidney Disorder  Other reaction(s): Kidney Disorder   Spironolactone     Other Reaction(s): Unknown  Other reaction(s): Unknown    Prior to Admission medications   Medication Sig Start Date End Date Taking? Authorizing Provider  buPROPion (WELLBUTRIN XL) 300 MG 24  hr tablet Take 300 mg by mouth daily. 07/26/22  Yes [provider]  darolutamide (NUBEQA) 300 MG tablet Take 600 mg by mouth 2 (two) times daily with a meal.   Yes Donneta Romberg, Worthy Flank, MD  diphenoxylate-atropine (LOMOTIL) 2.5-0.025 MG tablet Take 1 tablet by mouth 4 (four) times daily as needed for diarrhea or loose stools. 03/11/23  Yes Arnetha Courser, MD  gabapentin (NEURONTIN) 300 MG capsule Take 2 capsules (600 mg total) by mouth 3 (three) times daily. 02/15/23 06/15/23 Yes Borders, Daryl Eastern, NP  leptospermum manuka honey (MEDIHONEY) PSTE paste Apply 1 Application topically daily. 03/11/23  Yes Arnetha Courser, MD  lidocaine-prilocaine (EMLA) cream Apply 1 Application topically as needed. 11/27/22  Yes Earna Coder, MD  lisinopril (ZESTRIL) 20 MG tablet Take 20 mg by mouth 2 (two) times daily. 06/29/19 03/23/23 Yes [provider]   ondansetron (ZOFRAN) 8 MG tablet Take 1 tablet (8 mg total) by mouth every 8 (eight) hours as needed for nausea or vomiting. 11/29/22  Yes Earna Coder, MD  oxyCODONE-acetaminophen (PERCOCET/ROXICET) 5-325 MG tablet Take 1 tablet by mouth every 12 (twelve) hours as needed for moderate pain (pain score 4-6). 01/02/23  Yes Borders, Daryl Eastern, NP  senna-docusate (SENOKOT-S) 8.6-50 MG tablet Take 2 tablets by mouth 2 (two) times daily as needed for mild constipation. 10/01/22  Yes Marrion Coy, MD  warfarin (COUMADIN) 5 MG tablet Take 5-7.5 mg by mouth daily.   Yes [provider]  enoxaparin (LOVENOX) 150 MG/ML injection Inject 0.86 mLs (130 mg total) into the skin every 12 (twelve) hours. Patient not taking: Reported on 03/23/2023 03/11/23   Arnetha Courser, MD    Social History   Socioeconomic History   Marital status: Single    Spouse name: Not on file   Number of children: Not on file   Years of education: Not on file   Highest education level: Not on file  Occupational History   Not on file  Tobacco Use   Smoking status: Never   Smokeless tobacco: Former  Vaping Use   Vaping status: Never Used  Substance and Sexual Activity   Alcohol use: Yes    Alcohol/week: 3.0 standard drinks of alcohol    Types: 3 Glasses of wine per week   Drug use: Never   Sexual activity: Not on file  Other Topics Concern   Not on file  Social History Narrative   Not on file   Social Drivers of Health   Financial Resource Strain: High Risk (07/28/2021)   Received from [facility], [facility]   Overall Financial Resource Strain (CARDIA)    Difficulty of Paying Living Expenses: Very hard  Food Insecurity: No Food Insecurity (03/09/2023)   Hunger Vital Sign    Worried About Running Out of Food in the Last Year: Never true    Ran Out of Food in the Last Year: Never true  Transportation Needs: No Transportation Needs (03/09/2023)   PRAPARE - Therapist, art (Medical): No    Lack of Transportation (Non-Medical): No  Physical Activity: Sufficiently Active (07/28/2021)   Received from [facility], [facility]   Exercise Vital Sign    Days of Exercise per Week: 7 days    Minutes of Exercise per Session: 60 min  Stress: Stress Concern Present (08/01/2021)   Received from [facility], [facility]   [employer] of Occupational Health -  Occupational Stress Questionnaire    Feeling of Stress : Very much  Social Connections: Socially Isolated (08/01/2021)   Received from [facility], [facility]   Social Connection and Isolation Panel [NHANES]    Frequency of Communication with Friends and Family: Once a week    Frequency of Social Gatherings with Friends and Family: Never    Attends Religious Services: Never    Database administrator or Organizations: Yes    Attends Banker Meetings: Never    Marital Status: Separated  Intimate Partner Violence: Not At Risk (03/09/2023)   Humiliation, Afraid, Rape, and Kick questionnaire    Fear of Current or Ex-Partner: No    Emotionally Abused: No    Physically Abused: No    Sexually Abused: No     Family History  Problem Relation Age of Onset   Alzheimer's disease Mother    Lung cancer Father     ROS: Otherwise negative unless mentioned in HPI  Physical Examination  Vitals:   03/25/23 0030 03/25/23 0524  BP: 135/75 127/76  Pulse: 71 73  Resp: 14 18  Temp: 98.5 F (36.9 C) 97.8 F (36.6 C)  SpO2: 96% 94%   Body mass index is 62.7 kg/m.  General:  WDWN in NAD Gait: Not observed HENT: WNL, normocephalic Pulmonary: normal non-labored breathing, without Rales, rhonchi,  wheezing Cardiac: regular, without  Murmurs, rubs or gallops; without carotid bruits Abdomen: Positive bowel sounds, soft, NT/ND, no masses Skin: without rashes Vascular  Exam/Pulses: Bilateral lower AKA's Extremities: without ischemic changes, without Gangrene , without cellulitis; without open wounds;  Musculoskeletal: no muscle wasting or atrophy  Neurologic: A&O X 3;  No focal weakness or paresthesias are detected; speech is fluent/normal Psychiatric:  The pt has Normal affect. Lymph:  Unremarkable  CBC    Component Value Date/Time   WBC 3.3 (L) 03/25/2023 0432   RBC 3.13 (L) 03/25/2023 0432   HGB 10.3 (L) 03/25/2023 0432   HGB 8.9 (L) 02/25/2023 0820   HCT 31.5 (L) 03/25/2023 0432   PLT 195 03/25/2023 0432   PLT 193 02/25/2023 0820   MCV 100.6 (H) 03/25/2023 0432   MCH 32.9 03/25/2023 0432   MCHC 32.7 03/25/2023 0432   RDW 14.0 03/25/2023 0432   LYMPHSABS 0.3 (L) 03/07/2023 1521   MONOABS 0.4 03/07/2023 1521   EOSABS 0.1 03/07/2023 1521   BASOSABS 0.0 03/07/2023 1521    BMET    Component Value Date/Time   NA 138 03/24/2023 1715   K 2.9 (L) 03/24/2023 1715   CL 105 03/24/2023 1715   CO2 27 03/24/2023 1715   GLUCOSE 101 (H) 03/24/2023 1715   BUN 10 03/24/2023 1715   CREATININE 0.60 (L) 03/24/2023 1715   CREATININE 0.75 02/25/2023 0820   CALCIUM 8.8 (L) 03/24/2023 1715   GFRNONAA >60 03/24/2023 1715   GFRNONAA >60 02/25/2023 0820   GFRAA >60 10/19/2016 1335    COAGS: Lab Results  Component Value Date   INR 1.3 (H) 03/25/2023   INR 1.3 (H) 03/23/2023   INR 1.3 (H) 03/07/2023     Non-Invasive Vascular Imaging:   EXAM: BILATERAL LOWER EXTREMITY VENOUS DOPPLER ULTRASOUND   TECHNIQUE: Gray-scale sonography with graded compression, as well as color Doppler and duplex ultrasound were performed to evaluate the lower extremity deep venous systems from the level of the common femoral vein and including the common femoral, femoral, profunda femoral, popliteal and calf veins including the posterior tibial, peroneal and gastrocnemius veins  when visible. The superficial great saphenous vein was also interrogated. Spectral Doppler  was utilized to evaluate flow at rest and with distal augmentation maneuvers in the common femoral, femoral and popliteal veins.   COMPARISON:  03/07/2023   FINDINGS: RIGHT LOWER EXTREMITY   Common Femoral Vein: No evidence of thrombus. Normal compressibility, respiratory phasicity and response to augmentation.   Saphenofemoral Junction: No evidence of thrombus. Normal compressibility and flow on color Doppler imaging.   Profunda Femoral Vein: No evidence of thrombus. Normal compressibility and flow on color Doppler imaging.   Femoral Vein: Limited evaluation due to patient's body habitus. Interval development of occlusive thrombus within the visualized proximal segment. The distal segment is not well visualized.   Popliteal Vein: Absent   Calf Veins: Absent   Superficial Great Saphenous Vein: Not well visualized   Venous Reflux:  Not assessed   Other Findings:  None.   LEFT LOWER EXTREMITY   Common Femoral Vein: No evidence of thrombus. Normal compressibility, respiratory phasicity and response to augmentation.   Saphenofemoral Junction: No evidence of thrombus. Normal compressibility and flow on color Doppler imaging.   Profunda Femoral Vein: Occlusive thrombus within the visualized segment, similar prior examination   Femoral Vein: Limited visualization due to the patient's body habitus. Persistent occlusive thrombus within the visualized proximal and the mid segment.   Popliteal Vein: Absent   Calf Veins: Absent   Superficial Great Saphenous Vein: Not well assessed on this examination   Venous Reflux:  Not assessed   Other Findings:  None.   IMPRESSION: 1. Interval development of occlusive thrombus within the visualized proximal segment of the right femoral vein. 2. Persistent occlusive thrombus within the visualized segment of the left profunda femoral vein and proximal and mid segment of the left femoral vein.  Statin:  No. Beta Blocker:   No. Aspirin:  No. ACEI:  No. ARB:  No. CCB use:  No Other antiplatelets/anticoagulants:  Yes.   Coumadin Sub therapeutic   ASSESSMENT/PLAN: This is a 68 y.o. male with medical history significant of stage IV prostate cancer, recurrent DVT and PE, lymphedema, hypertension, status post AKA bilaterally, pressure ulcers presenting with recurrent DVT.   PLAN Vascular surgery does not recommend venous thrombectomy or inferior vena cava filter placement at this time. It is unfortunate that the patient was unable to afford Lovenox which he was prescribed at his last discharge.  The extension of his DVTs bilaterally are most likely caused by him transitioning himself to his home Coumadin thus not being bridged to a proper level of anticoagulation.  The outcome of this is bilateral lower AKA swelling with scrotal edema.  Vascular surgery recommends patient be placed on Coumadin going forward as he endorses he will be unable to afford Lovenox at home.   -I discussed the plan in detail with Dr. Levora Dredge MD and he agrees with plan   Marcie Bal [facility] 03/25/2023 7:42 AM

## 2023-03-25 NOTE — Progress Notes (Signed)
Progress Note   Patient: [name] ZOX:[id number] DOB: February 26, 1956 DOA: 03/23/2023     2 DOS: the patient was seen and examined on 03/25/2023   Brief hospital course:  [name] is a 68 y.o. male with medical history significant of stage IV prostate cancer, recurrent DVT and PE, lymphedema, hypertension, status post AKA bilaterally, pressure ulcers presenting with recurrent DVT.  Patient noted to have been admitted January 2 January 6 for acute left-sided DVT.  Had DVT recurrence despite Eliquis use.  Was transition from heparin to Lovenox at discharge.  Patient states he was unable to afford it and was subsequently transition to Coumadin by PCP.  Coumadin Level has been subtherapeutic per report.  Has had worsening shortness of breath lower extremity swelling and pain x 2 to 3 days.  Noted baseline bilateral AKA.  Mild orthopnea.  No chest pain.  No fevers or chills.  Baseline stage IV prostate cancer.  Followed by Dr. Donneta Romberg.  Non-smoker. Presented to the ER afebrile, hemodynamically stable.  Satting well on room air.  White count 4.4, hemoglobin 10.4, platelets 230, INR 1.3, COVID flu and RSV negative.  Creatinine 0.87.  BNP 140.  Chest x-ray with cardiomegaly.  Lower extremity ultrasound with occlusive thrombus in the proximal segment of right femoral vein.  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 Review of Systems: As mentioned in the history of present illness. All other systems reviewed and are negative.         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 High thromboembolism risk in the setting of underlying active malignancy Noted recent for similar issues January 2 to January 6 Was unable to afford Lovenox and transitioned to Coumadin in outpt  setting without the bridge INR remains subtherapeutic Lovenox bridge. Continue scheduled Coumadin Chest CT is negative for PE      Hypokalemia Hypomagnesemia Severe Secondary to diuretic therapy as well as GI losses from diarrhea Supplement potassium and magnesium     Acute on chronic diastolic CHF Patient with complaints of fluid overload, excessive weight gain in both thighs and scrotum Progressive shortness of breath and generalized swelling in the setting of active malignancy and recurrent DVT Positive cardiomegaly on chest x-ray BNP 140 2D echo shows an LVEF of 55 - 60%. Normal RV size and function Concern for possible Cor Pulmonale related to recurrent VTE Continue IV Lasix Resume Lisinopril if BP is stable     Prostate cancer metastatic to bone (HCC) Baseline stage 4 metastatic prostate with bone involvement  Followed by Dr. 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  Recommends to follow-up with CT surgery as an outpatient     Stage III pressure injury Involving the ischial tuberosity Continue local wound care       Subjective: Continues to complain of generalized swelling  Physical Exam: Vitals:  03/25/23 1000 03/25/23 1008 03/25/23 1100 03/25/23 1315  BP:    118/76  Pulse: 73  74 71  Resp: (!) 27  (!) 23 20  Temp:  97.8 F (36.6 C)    TempSrc:  Oral    SpO2: 96%  96% 100%  Weight:      Height:          Appearance: He is obese.  HENT:     Head: Normocephalic and atraumatic.     Nose: Nose  normal.     Mouth/Throat:     Mouth: Mucous membranes are moist.  Eyes:     Pupils: Pupils are equal, round, and reactive to light.  Cardiovascular:     Rate and Rhythm: Normal rate and regular rhythm.  Pulmonary:     Effort: Pulmonary effort is normal.  Abdominal:     General: Bowel sounds are normal.  Musculoskeletal:     Right lower leg: Edema present.     Left lower leg: Edema present.     Comments: S/p bilateral AKA   Neurological:     General: No focal deficit present.  Psychiatric:        Mood and Affect: Mood normal.    Data Reviewed: Labs reviewed.  Potassium 2.7, INR 1.3, magnesium 1.8 There are no new results to review at this time.  Family Communication: Plan of care discussed with patient  Disposition: Status is: Inpatient Remains inpatient appropriate because: Remains on IV diuresis  Planned Discharge Destination: Home with Home Health    Time spent: 36 minutes  Author: Lucile Shutters, MD 03/25/2023 2:33 PM  For on call review [email or web address].

## 2023-03-25 NOTE — ED Notes (Signed)
Pt placed on a bedpan per request

## 2023-03-26 ENCOUNTER — Inpatient Hospital Stay: Payer: Medicare PPO

## 2023-03-26 DIAGNOSIS — I824Y1 Acute embolism and thrombosis of unspecified deep veins of right proximal lower extremity: Secondary | ICD-10-CM | POA: Diagnosis not present

## 2023-03-26 LAB — GASTROINTESTINAL PANEL BY PCR, STOOL (REPLACES STOOL CULTURE)

## 2023-03-26 LAB — BASIC METABOLIC PANEL
Anion gap: 10 (ref 5–15)
Anion gap: 9 (ref 5–15)
BUN: 7 mg/dL — ABNORMAL LOW (ref 8–23)
BUN: 6 mg/dL — ABNORMAL LOW (ref 8–23)
CO2: 27 mmol/L (ref 22–32)
CO2: 27 mmol/L (ref 22–32)
Calcium: 8.7 mg/dL — ABNORMAL LOW (ref 8.9–10.3)
Calcium: 8.7 mg/dL — ABNORMAL LOW (ref 8.9–10.3)
Chloride: 101 mmol/L (ref 98–111)
Chloride: 100 mmol/L (ref 98–111)
Creatinine, Ser: 0.64 mg/dL (ref 0.61–1.24)
Creatinine, Ser: 0.57 mg/dL — ABNORMAL LOW (ref 0.61–1.24)
GFR, Estimated: >60 mL/min (ref >60)
GFR, Estimated: >60 mL/min (ref >60)
Glucose, Bld: 91 mg/dL (ref 70–99)
Glucose, Bld: 92 mg/dL (ref 70–99)
Potassium: 2.7 mmol/L — CL (ref 3.5–5.1)
Potassium: 2.9 mmol/L — ABNORMAL LOW (ref 3.5–5.1)
Sodium: 138 mmol/L (ref 135–145)
Sodium: 136 mmol/L (ref 135–145)

## 2023-03-26 LAB — MAGNESIUM: Magnesium: 1.9 mg/dL (ref 1.7–2.4)

## 2023-03-26 LAB — PROTIME-INR
INR: 1.3 — ABNORMAL HIGH (ref 0.8–1.2)
Prothrombin Time: 16.7 s — ABNORMAL HIGH (ref 11.4–15.2)

## 2023-03-26 MED ORDER — MAGNESIUM SULFATE 2 GM/50ML IV SOLN
2.0000 g | Freq: Once | INTRAVENOUS | Status: AC
  Administered 2023-03-26: 2 g via INTRAVENOUS
  Filled 2023-03-26: qty 50

## 2023-03-26 MED ORDER — IOHEXOL 300 MG/ML  SOLN: 30.0000 mL | Freq: Once | INTRAMUSCULAR | Status: DC | PRN

## 2023-03-26 MED ORDER — POTASSIUM CHLORIDE CRYS ER 20 MEQ PO TBCR
40.0000 meq | EXTENDED_RELEASE_TABLET | Freq: Once | ORAL | Status: AC
  Administered 2023-03-26: 40 meq via ORAL
  Filled 2023-03-26: qty 2

## 2023-03-26 MED ORDER — WARFARIN SODIUM 10 MG PO TABS
10.0000 mg | ORAL_TABLET | Freq: Once | ORAL | Status: AC
  Administered 2023-03-26: 10 mg via ORAL
  Filled 2023-03-26: qty 1

## 2023-03-26 MED ORDER — POTASSIUM CHLORIDE CRYS ER 20 MEQ PO TBCR
40.0000 meq | EXTENDED_RELEASE_TABLET | Freq: Once | ORAL | Status: AC
  Administered 2023-03-27: 40 meq via ORAL
  Filled 2023-03-26: qty 2

## 2023-03-26 MED ORDER — POTASSIUM CHLORIDE CRYS ER 20 MEQ PO TBCR: 40.0000 meq | EXTENDED_RELEASE_TABLET | Freq: Once | ORAL | Status: DC

## 2023-03-26 MED ORDER — LOPERAMIDE HCL 2 MG PO CAPS
4.0000 mg | ORAL_CAPSULE | Freq: Four times a day (QID) | ORAL | Status: DC | PRN
  Administered 2023-03-27: 4 mg via ORAL
  Filled 2023-03-26: qty 2

## 2023-03-26 MED ORDER — LISINOPRIL 10 MG PO TABS
10.0000 mg | ORAL_TABLET | Freq: Every day | ORAL | Status: DC
  Administered 2023-03-26 – 2023-04-05 (×11): 10 mg via ORAL
  Filled 2023-03-26 (×11): qty 1

## 2023-03-26 NOTE — Care Management Important Message (Signed)
Important Message  Patient Details  Name: [name] MRN: [id number] Date of Birth: January 01, 1956   Important Message Given:  Yes - Medicare IM     Sherilyn Banker 03/26/2023, 12:26 PM

## 2023-03-26 NOTE — Consult Note (Addendum)
PHARMACY - ANTICOAGULATION CONSULT NOTE  Pharmacy Consult for warfarin & enoxaparin Indication: DVT  Allergies  Allergen Reactions   Bacitracin Hives   Vancomycin Other (See Comments) and Rash    Red man syndrome.   Ketorolac     Other Reaction(s): Kidney Disorder  Other reaction(s): Kidney Disorder   Spironolactone     Other Reaction(s): Unknown  Other reaction(s): Unknown   Patient Measurements: Height: 4\' 10"  (147.3 cm) Weight: 136.1 kg (300 lb) IBW/kg (Calculated) : 45.4  Vital Signs: Temp: 98 F (36.7 C) (01/21 0351) Temp Source: Oral (01/21 0000) BP: 129/79 (01/21 0351) Pulse Rate: 69 (01/21 0351)  Labs: Recent Labs    03/23/23 1611 03/24/23 0035 03/24/23 0518 03/24/23 0518 03/24/23 1715 03/25/23 0432 03/25/23 1023 03/26/23 0507  HGB  --   --  9.8*  --   --  10.3*  --   --   HCT  --   --  29.8*  --   --  31.5*  --   --   PLT  --   --  193  --   --  195  --   --   LABPROT  --   --   --   --   --  16.2*  --  16.7*  INR  --   --   --   --   --  1.3*  --  1.3*  HEPARINUNFRC 1.04* 0.61 0.51  --   --   --   --   --   CREATININE  --   --  0.70   < > 0.60*  --  0.64 0.64   < > = values in this interval not displayed.   Estimated Creatinine Clearance: 103.5 mL/min (by C-G formula based on SCr of 0.64 mg/dL).  Medical History: Past Medical History:  Diagnosis Date   Depression    History of blood clots 08/13/2011   History of left knee replacement    HTN (hypertension)    Lymphedema    lt leg-per pt   PE (pulmonary thromboembolism) (HCC)    Pressure injury of skin, unspecified injury stage, unspecified location    S/P AKA (above knee amputation) bilateral (HCC)    rt 11/24/07 and lt 01/11/17   Warfarin INR 1.2 on 03/19/2023 after 2 weeks on warfarin 5 mg daily. Per note review - warfarin dose was changed to 7.5 mg three times a week and 5 mg on the other four days.  INR on admission 1.3 (03/23/2023). Therapeutic enoxaparin 1mg /kg q 12hr initiated as  well.  Assessment: PMH significant of stage IV prostate cancer, recurrent DVT and PE, lymphedema, hypertension, status post AKA bilaterally, pressure ulcers. Patient presents with recurrent DVT despite eliquis use. INR sub-therapeutic on admission and sub-therapeutic on last outpatient check as well. Pharmacy has been consulted to manage this patient's warfarin.   Goal of Therapy:  INR 2-3 Monitor platelets by anticoagulation protocol: Yes  Warfarin Date@Time  INR Clinical Assessment Warfarin Dose   1/18 1.3 SUBtherapeutic ---  1/19 ---  7.5 mg  1/20 1.3 SUBtherapeutic 10 mg   1/21 1.3  SUBtherapeutic Plan 10 mg        Plan:  INR remains at 1.3 today  Will repeat warfarin 10 mg x 1 dose tonight and consider increasing dose tomorrow if no improvement seen in INR  Continue enoxaparin 1mg /kg Q12H for a minimum of 5 days and until INR > 2 for 24 hours Daily INR for now CBC stable; monitor at  least every 72 hours   Littie Deeds, PharmD Pharmacy Resident  03/26/2023 7:47 AM

## 2023-03-26 NOTE — Progress Notes (Signed)
Progress Note   Patient: [name]:[id number] DOB: June 09, 1955 DOA: 03/23/2023     3 DOS: the patient was seen and examined on 03/26/2023   Brief hospital course:  Ovadia Toste is a 68 y.o. male with medical history significant of stage IV prostate cancer, recurrent DVT and PE, lymphedema, hypertension, status post AKA bilaterally, pressure ulcers presenting with recurrent DVT.  Patient noted to have been admitted January 2 January 6 for acute left-sided DVT.  Had DVT recurrence despite Eliquis use.  Was transition from heparin to Lovenox at discharge.  Patient states he was unable to afford it and was subsequently transition to Coumadin by PCP.  Coumadin Level has been subtherapeutic per report.  Has had worsening shortness of breath lower extremity swelling and pain x 2 to 3 days.  Noted baseline bilateral AKA.  Mild orthopnea.  No chest pain.  No fevers or chills.  Baseline stage IV prostate cancer.  Followed by Dr. Donneta Romberg.  Non-smoker. Presented to the ER afebrile, hemodynamically stable.  Satting well on room air.  White count 4.4, hemoglobin 10.4, platelets 230, INR 1.3, COVID flu and RSV negative.  Creatinine 0.87.  BNP 140.  Chest x-ray with cardiomegaly.  Lower extremity ultrasound with occlusive thrombus in the proximal segment of right femoral vein.  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 Review of Systems: As mentioned in the history of present illness. All other systems reviewed and are negative.     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 High thromboembolism risk in the setting of underlying active malignancy Noted recent for similar issues January 2 to January 6 and discharged on Lovenox Was unable to afford Lovenox and transitioned to  Coumadin in outpt setting without the bridge INR remains subtherapeutic Lovenox bridge. Continue scheduled Coumadin Chest CT is negative for PE       Hypokalemia Hypomagnesemia Severe Secondary to diuretic therapy as well as GI losses from diarrhea Supplement potassium and magnesium       Acute on chronic diastolic CHF Patient with complaints of fluid overload, excessive weight gain in both thighs and scrotum Progressive shortness of breath and generalized swelling in the setting of active malignancy and recurrent DVT Positive cardiomegaly on chest x-ray BNP 140 2D echo shows an LVEF of 55 - 60%. Normal RV size and function Concern for possible Cor Pulmonale related to recurrent VTE Continue IV Lasix Resume Lisinopril at 10mg  daily     Prostate cancer metastatic to bone (HCC) Baseline stage 4 metastatic prostate with bone involvement  Followed by Dr. Donneta Romberg        Essential hypertension Patient is normotensive     Diarrhea Persistent despite scheduled Lomotil Send stool PCR Obtain CT scan of the abdomen and pelvis for further evaluation   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 Chronic due to poor ambulation Wound care consult     Anxiety/Depression Continue Bupropion       Morbid Obesity BMI 62 Patient is s/p bilateral AKA Wheel chair bound       Ascending thoracic aneurysm Measures 5cm Patient aware of aneurysm but has not seen vascular surgery in years Appreciate vascular surgery input.  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  HENT:     Head: Normocephalic and atraumatic.     Nose: Nose normal.     Mouth/Throat:     Mouth: Mucous membranes are moist.  Eyes:     Pupils: Pupils are equal, round, and reactive to light.  Cardiovascular:     Rate and Rhythm: Normal rate and regular rhythm.  Pulmonary:     Effort: Pulmonary effort is normal.  Abdominal:     General: Bowel sounds are normal.  Musculoskeletal:     Right lower leg: Edema present.     Left lower leg: Edema present.     Comments: S/p bilateral AKA   Neurological:     General: No focal deficit present.  Psychiatric:        Mood and Affect: Mood normal.    Data Reviewed: Reviewed.  Potassium 2.7, magnesium 1.8 There are no new results to review at this time.  Family Communication: Plan of care discussed with patient in detail.  He verbalizes understanding and agrees with the plan  Disposition: Status is: Inpatient Remains inpatient appropriate because: On IV diuretics, needs potassium supplementation  Planned Discharge Destination: Home with Home Health    Time spent: 35 minutes  Author: Lucile Shutters, MD 03/26/2023 1:41 PM  For on call review [email or web address].

## 2023-03-27 ENCOUNTER — Encounter: Payer: Self-pay | Admitting: Internal Medicine

## 2023-03-27 DIAGNOSIS — I82403 Acute embolism and thrombosis of unspecified deep veins of lower extremity, bilateral: Secondary | ICD-10-CM | POA: Diagnosis not present

## 2023-03-27 DIAGNOSIS — R197 Diarrhea, unspecified: Secondary | ICD-10-CM | POA: Diagnosis not present

## 2023-03-27 LAB — CBC
HCT: 32.1 % — ABNORMAL LOW (ref 39.0–52.0)
Hemoglobin: 10.6 g/dL — ABNORMAL LOW (ref 13.0–17.0)
MCH: 33 pg (ref 26.0–34.0)
MCHC: 33 g/dL (ref 30.0–36.0)
MCV: 100 fL (ref 80.0–100.0)
Platelets: 163 10*3/uL (ref 150–400)
RBC: 3.21 MIL/uL — ABNORMAL LOW (ref 4.22–5.81)
RDW: 13.7 % (ref 11.5–15.5)
WBC: 2.2 10*3/uL — ABNORMAL LOW (ref 4.0–10.5)
nRBC: 0 % (ref 0.0–0.2)

## 2023-03-27 LAB — BASIC METABOLIC PANEL
Anion gap: 11 (ref 5–15)
BUN: 6 mg/dL — ABNORMAL LOW (ref 8–23)
CO2: 28 mmol/L (ref 22–32)
Calcium: 9 mg/dL (ref 8.9–10.3)
Chloride: 102 mmol/L (ref 98–111)
Creatinine, Ser: 0.54 mg/dL — ABNORMAL LOW (ref 0.61–1.24)
GFR, Estimated: >60 mL/min (ref >60)
Glucose, Bld: 88 mg/dL (ref 70–99)
Potassium: 3.1 mmol/L — ABNORMAL LOW (ref 3.5–5.1)
Sodium: 141 mmol/L (ref 135–145)

## 2023-03-27 LAB — PROTIME-INR
INR: 1.3 — ABNORMAL HIGH (ref 0.8–1.2)
Prothrombin Time: 16.6 s — ABNORMAL HIGH (ref 11.4–15.2)

## 2023-03-27 MED ORDER — WARFARIN SODIUM 7.5 MG PO TABS
15.0000 mg | ORAL_TABLET | Freq: Once | ORAL | Status: AC
Start: 2023-03-27 — End: 2023-03-27
  Administered 2023-03-27: 15 mg via ORAL
  Filled 2023-03-27: qty 2

## 2023-03-27 MED ORDER — POTASSIUM CHLORIDE CRYS ER 20 MEQ PO TBCR
40.0000 meq | EXTENDED_RELEASE_TABLET | Freq: Once | ORAL | Status: AC
  Administered 2023-03-27: 40 meq via ORAL
  Filled 2023-03-27: qty 2

## 2023-03-27 MED ORDER — PSYLLIUM 95 % PO PACK
1.0000 | PACK | Freq: Every day | ORAL | Status: DC
  Administered 2023-03-27 – 2023-04-04 (×7): 1 via ORAL
  Filled 2023-03-27 (×11): qty 1

## 2023-03-27 MED ORDER — SIMETHICONE 80 MG PO CHEW
40.0000 mg | CHEWABLE_TABLET | Freq: Four times a day (QID) | ORAL | Status: DC
  Administered 2023-03-27 – 2023-03-28 (×5): 40 mg via ORAL
  Filled 2023-03-27 (×5): qty 1

## 2023-03-27 MED ORDER — FUROSEMIDE 10 MG/ML IJ SOLN
40.0000 mg | Freq: Two times a day (BID) | INTRAMUSCULAR | Status: DC
  Administered 2023-03-27 – 2023-04-05 (×17): 40 mg via INTRAVENOUS
  Filled 2023-03-27 (×18): qty 4

## 2023-03-27 MED ORDER — POTASSIUM CHLORIDE CRYS ER 20 MEQ PO TBCR
40.0000 meq | EXTENDED_RELEASE_TABLET | ORAL | Status: AC
  Administered 2023-03-27: 40 meq via ORAL
  Filled 2023-03-27: qty 2

## 2023-03-27 MED ORDER — TRAZODONE HCL 50 MG PO TABS
50.0000 mg | ORAL_TABLET | Freq: Every evening | ORAL | Status: DC | PRN
  Administered 2023-03-30 – 2023-04-01 (×3): 50 mg via ORAL
  Filled 2023-03-27 (×5): qty 1

## 2023-03-27 NOTE — Progress Notes (Signed)
Progress Note   Patient: [name] ZOX:[id number] DOB: 1955-10-08 DOA: 03/23/2023     4 DOS: the patient was seen and examined on 03/27/2023   Brief hospital course:  [name] is a 68 y.o. male with medical history significant of stage IV prostate cancer, recurrent DVT and PE, lymphedema, hypertension, status post AKA bilaterally, pressure ulcers presenting with recurrent DVT.  Patient noted to have been admitted January 2 January 6 for acute left-sided DVT.  Had DVT recurrence despite Eliquis use.  Was transition from heparin to Lovenox at discharge.  Patient states he was unable to afford it and was subsequently transition to Coumadin by PCP.  Coumadin Level has been subtherapeutic per report.  Has had worsening shortness of breath lower extremity swelling and pain x 2 to 3 days.  Noted baseline bilateral AKA.  Mild orthopnea.  No chest pain.  No fevers or chills.  Baseline stage IV prostate cancer.  Followed by Dr. Donneta Romberg.  Non-smoker. Presented to the ER afebrile, hemodynamically stable.  Satting well on room air.  White count 4.4, hemoglobin 10.4, platelets 230, INR 1.3, COVID flu and RSV negative.  Creatinine 0.87.  BNP 140.  Chest x-ray with cardiomegaly.  Lower extremity ultrasound with occlusive thrombus in the proximal segment of right femoral vein.  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 Review of Systems: As mentioned in the history of present illness. All other systems reviewed and are negative.     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 High thromboembolism risk in the setting of underlying active malignancy Noted recent for similar issues January 2 to January 6 and discharged on Lovenox Was unable to afford Lovenox and transitioned to  Coumadin in outpt setting without the bridge INR remains subtherapeutic at 1.3 Chest CT is negative for PE --Continue Lovenox bridge >> Coumadin dosing per pharmacy      Hypokalemia Hypomagnesemia Severe Secondary to diuretic therapy as well as GI losses from diarrhea --Supplement potassium and magnesium     Acute on chronic diastolic CHF Patient with complaints of fluid overload, excessive weight gain in both thighs and scrotum Progressive shortness of breath and generalized swelling in the setting of active malignancy and recurrent DVT Positive cardiomegaly on chest x-ray BNP 140 2D echo shows an LVEF of 55 - 60%. Normal RV size and function Concern for possible Cor Pulmonale related to recurrent VTE, but CTA chest on admission was negative for pulmonary emboli, could have tiny clots not detected on CT.  He continues to have progressive peripheral edema. --Continue IV Lasix - increase from daily to BID dosing --Resumed Lisinopril at 10mg  daily --Strict Io's, daily weights --Monitor renal function, electrolytes     Prostate cancer metastatic to bone (HCC) Baseline stage 4 metastatic prostate with bone involvement  --Followed by Dr. Donneta Romberg  --[name] while inpatient    Essential hypertension --Stable, monitor     Diarrhea Abdominal pain - post bowel movements, suspect due to retained stool in colon CT abd/pelvis w/o contrast on 1/21 - no acute findings but showed moderate stool burden in the colon.  Suspect diarrhea is 'overflow' stool passing around retained solid stool. GI pathogen panel is negative. Very low suspicion C diff given low frequency of episodes, no leukocytosis, no fevers --Avoid anti-diarrheals for now --PO hydration --Simethicone trial QID  today --May need stool softeners despite diarrhea, monitor for improvement for now    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 Chronic due to poor ambulation --Wound care consulted,  appreciate input    Anxiety/Depression --Continue Bupropion    Morbid Obesity BMI 62 Patient is s/p bilateral AKA --Wheel chair bound but fully independent at home  --Will ask PT/OT to follow for maintenance of strength for independent transfers    Ascending thoracic aneurysm Measures 5cm Patient aware of aneurysm but has not seen vascular surgery in years Appreciate vascular surgery input.  Recommends to follow-up with CT surgery as an outpatient     Stage III pressure injury Involving the ischial tuberosity I agree with the wound description/s as outlined. --Continue diligent wound care, frequent off-loading, monitor for s/sx's of infection  Pressure Injury 03/23/23 Ischial tuberosity Left Stage 3 -  Full thickness tissue loss. Subcutaneous fat may be visible but bone, tendon or muscle are NOT exposed. 1 cm x 1 cm x 0.1 cm 50% pink moist 50% yellow (Active)  03/23/23   Location: Ischial tuberosity  Location Orientation: Left  Staging: Stage 3 -  Full thickness tissue loss. Subcutaneous fat may be visible but bone, tendon or muscle are NOT exposed.  Wound Description (Comments): 1 cm x 1 cm x 0.1 cm 50% pink moist 50% yellow  Present on Admission: Yes     Pressure Injury 03/26/23 Buttocks Left;Lateral Stage 2 -  Partial thickness loss of dermis presenting as a shallow open injury with a red, pink wound bed without slough. (Active)  03/26/23 0000  Location: Buttocks  Location Orientation: Left;Lateral  Staging: Stage 2 -  Partial thickness loss of dermis presenting as a shallow open injury with a red, pink wound bed without slough.  Wound Description (Comments):   Present on Admission: Yes           Subjective: Pt continues to report watery diarrhea the color of drinking water today.  After BM's he reports sharp abdominal pains.  No N/V or F/C.  Reports high urine output.  States not sleeping at night, typical for him.  Reports swelling is increasing despite IV lasix  given so far.   Physical Exam: Vitals:   03/26/23 2345 03/27/23 0527 03/27/23 0852 03/27/23 1433  BP: (!) 146/78 (!) 144/85 (!) 152/89 138/81  Pulse: 71 69 72 72  Resp: 20 20 17 18   Temp: 98.9 F (37.2 C) 98.7 F (37.1 C) 99.4 F (37.4 C) 98.9 F (37.2 C)  TempSrc:      SpO2: 98% 96% 100% 99%  Weight:  (!) 154.3 kg    Height:        General exam: awake, alert, no acute distress HEENT: wearing glasses, moist mucus membranes, hearing grossly normal  Respiratory system: CTAB, no wheezes, rales or rhonchi, normal respiratory effort. Cardiovascular system: normal S1/S2, RRR, lower extremity edema >> proximally including scrotal and truncal edema   Gastrointestinal system: distended, non-tender Central nervous system: A&O x 4. no gross focal neurologic deficits, normal speech Extremities: B/L AKA's, BLE tense edema but not weaping Skin: dry, intact, normal temperature Psychiatry: normal mood, congruent affect, judgement and insight appear normal    Data Reviewed: Notable labs --  K 2.9 >> 3.1 BUN6 Cr 0.54 WBC 2.2 Hbg 10.6 INR 1.3 unchanged  GI panel negative   Family Communication: Plan of care discussed with patient in detail.  Pt is capable to update others as he sees fit.  Will update if requested  by patient.   Disposition: Status is: Inpatient Remains inpatient appropriate because: remains on IV diuretics, and requiring daily potassium supplementation with close lab monitoring.  Not yet medically stable for d/c.   Planned Discharge Destination: Home with Home Health    Time spent: 45 minutes  Author: Pennie Banter, DO 03/27/2023 3:29 PM  For on call review [email or web address].

## 2023-03-27 NOTE — Progress Notes (Signed)
Patient spoke to [facility] team and shared interest in supporting other patients / working for [facility]. Patient no interested in behavioral health or supportive services for himself at this time.

## 2023-03-27 NOTE — Consult Note (Signed)
PHARMACY - ANTICOAGULATION CONSULT NOTE  Pharmacy Consult for warfarin & enoxaparin Indication: DVT  Allergies  Allergen Reactions   Bacitracin Hives   Vancomycin Other (See Comments) and Rash    Red man syndrome.   Ketorolac     Other Reaction(s): Kidney Disorder  Other reaction(s): Kidney Disorder   Spironolactone     Other Reaction(s): Unknown  Other reaction(s): Unknown   Patient Measurements: Height: 4\' 10"  (147.3 cm) Weight: (!) 154.3 kg (340 lb 2.7 oz) IBW/kg (Calculated) : 45.4  Vital Signs: Temp: 98.7 F (37.1 C) (01/22 0527) BP: 144/85 (01/22 0527) Pulse Rate: 69 (01/22 0527)  Labs: Recent Labs    03/25/23 0432 03/25/23 1023 03/26/23 0507 03/26/23 1723 03/27/23 0458  HGB 10.3*  --   --   --  10.6*  HCT 31.5*  --   --   --  32.1*  PLT 195  --   --   --  163  LABPROT 16.2*  --  16.7*  --  16.6*  INR 1.3*  --  1.3*  --  1.3*  CREATININE  --    < > 0.64 0.57* 0.54*   < > = values in this interval not displayed.   Estimated Creatinine Clearance: 112.8 mL/min (A) (by C-G formula based on SCr of 0.54 mg/dL (L)).  Medical History: Past Medical History:  Diagnosis Date   Depression    History of blood clots 08/13/2011   History of left knee replacement    HTN (hypertension)    Lymphedema    lt leg-per pt   PE (pulmonary thromboembolism) (HCC)    Pressure injury of skin, unspecified injury stage, unspecified location    S/P AKA (above knee amputation) bilateral (HCC)    rt 11/24/07 and lt 01/11/17   Warfarin INR 1.2 on 03/19/2023 after 2 weeks on warfarin 5 mg daily. Per note review - warfarin dose was changed to 7.5 mg three times a week and 5 mg on the other four days.  INR on admission 1.3 (03/23/2023). Therapeutic enoxaparin 1mg /kg q 12hr initiated as well.  Assessment: PMH significant of stage IV prostate cancer, recurrent DVT and PE, lymphedema, hypertension, status post AKA bilaterally, pressure ulcers. Patient presents with recurrent DVT despite  eliquis use. INR sub-therapeutic on admission and sub-therapeutic on last outpatient check as well. Pharmacy has been consulted to manage this patient's warfarin.   Goal of Therapy:  INR 2-3 Monitor platelets by anticoagulation protocol: Yes  Warfarin Date@Time  INR Clinical Assessment Warfarin Dose   1/18 1.3 SUBtherapeutic ---  1/19 ---  7.5 mg  1/20 1.3 SUBtherapeutic 10 mg   1/21 1.3  SUBtherapeutic 10 mg  1/22 1.3 SUBtherapeutic Plan 15 mg    Plan:  INR remains at 1.3 today  Will plan for warfarin 15 mg x 1 dose tonight given no movement in INR  Continue enoxaparin 1mg /kg Q12H for a minimum of 5 days and until INR > 2 for 24 hours No drug-drug interactions with current regimen  Daily INR for now CBC stable; monitor at least every 72 hours   Littie Deeds, PharmD Pharmacy Resident  03/27/2023 7:24 AM

## 2023-03-27 NOTE — Plan of Care (Signed)
  Problem: Education: Goal: Knowledge of General Education information will improve Description Including pain rating scale, medication(s)/side effects and non-pharmacologic comfort measures Outcome: Progressing   

## 2023-03-28 ENCOUNTER — Inpatient Hospital Stay: Payer: Medicare PPO

## 2023-03-28 DIAGNOSIS — I82403 Acute embolism and thrombosis of unspecified deep veins of lower extremity, bilateral: Secondary | ICD-10-CM | POA: Diagnosis not present

## 2023-03-28 DIAGNOSIS — R197 Diarrhea, unspecified: Secondary | ICD-10-CM | POA: Diagnosis not present

## 2023-03-28 DIAGNOSIS — C7951 Secondary malignant neoplasm of bone: Secondary | ICD-10-CM | POA: Diagnosis not present

## 2023-03-28 DIAGNOSIS — C61 Malignant neoplasm of prostate: Secondary | ICD-10-CM | POA: Diagnosis not present

## 2023-03-28 LAB — BASIC METABOLIC PANEL
Anion gap: 11 (ref 5–15)
BUN: 6 mg/dL — ABNORMAL LOW (ref 8–23)
CO2: 30 mmol/L (ref 22–32)
Calcium: 9.1 mg/dL (ref 8.9–10.3)
Chloride: 98 mmol/L (ref 98–111)
Creatinine, Ser: 0.7 mg/dL (ref 0.61–1.24)
GFR, Estimated: >60 mL/min (ref >60)
Glucose, Bld: 94 mg/dL (ref 70–99)
Potassium: 2.9 mmol/L — ABNORMAL LOW (ref 3.5–5.1)
Sodium: 139 mmol/L (ref 135–145)

## 2023-03-28 LAB — CBC WITH DIFFERENTIAL/PLATELET
Abs Immature Granulocytes: 0.01 10*3/uL (ref 0.00–0.07)
Basophils Absolute: 0 10*3/uL (ref 0.0–0.1)
Basophils Relative: 1 %
Eosinophils Absolute: 0.3 10*3/uL (ref 0.0–0.5)
Eosinophils Relative: 11 %
HCT: 33.1 % — ABNORMAL LOW (ref 39.0–52.0)
Hemoglobin: 10.9 g/dL — ABNORMAL LOW (ref 13.0–17.0)
Immature Granulocytes: 0 %
Lymphocytes Relative: 21 %
Lymphs Abs: 0.5 10*3/uL — ABNORMAL LOW (ref 0.7–4.0)
MCH: 32.4 pg (ref 26.0–34.0)
MCHC: 32.9 g/dL (ref 30.0–36.0)
MCV: 98.5 fL (ref 80.0–100.0)
Monocytes Absolute: 0.4 10*3/uL (ref 0.1–1.0)
Monocytes Relative: 16 %
Neutro Abs: 1.2 10*3/uL — ABNORMAL LOW (ref 1.7–7.7)
Neutrophils Relative %: 51 %
Platelets: 175 10*3/uL (ref 150–400)
RBC: 3.36 MIL/uL — ABNORMAL LOW (ref 4.22–5.81)
RDW: 13.7 % (ref 11.5–15.5)
WBC: 2.3 10*3/uL — ABNORMAL LOW (ref 4.0–10.5)
nRBC: 0 % (ref 0.0–0.2)

## 2023-03-28 LAB — PROTIME-INR
INR: 1.3 — ABNORMAL HIGH (ref 0.8–1.2)
Prothrombin Time: 16.8 s — ABNORMAL HIGH (ref 11.4–15.2)

## 2023-03-28 LAB — MAGNESIUM: Magnesium: 1.8 mg/dL (ref 1.7–2.4)

## 2023-03-28 MED ORDER — WARFARIN SODIUM 7.5 MG PO TABS
15.0000 mg | ORAL_TABLET | Freq: Once | ORAL | Status: AC
  Administered 2023-03-28: 15 mg via ORAL
  Filled 2023-03-28: qty 2

## 2023-03-28 MED ORDER — SENNOSIDES-DOCUSATE SODIUM 8.6-50 MG PO TABS
1.0000 | ORAL_TABLET | Freq: Two times a day (BID) | ORAL | Status: DC
  Administered 2023-03-28 – 2023-04-05 (×12): 1 via ORAL
  Filled 2023-03-28 (×15): qty 1

## 2023-03-28 MED ORDER — GABAPENTIN 300 MG PO CAPS
600.0000 mg | ORAL_CAPSULE | Freq: Three times a day (TID) | ORAL | Status: DC
  Administered 2023-03-28 – 2023-04-05 (×24): 600 mg via ORAL
  Filled 2023-03-28 (×24): qty 2

## 2023-03-28 MED ORDER — POTASSIUM CHLORIDE CRYS ER 20 MEQ PO TBCR
40.0000 meq | EXTENDED_RELEASE_TABLET | ORAL | Status: AC
  Administered 2023-03-28 (×4): 40 meq via ORAL
  Filled 2023-03-28 (×4): qty 2

## 2023-03-28 MED ORDER — LACTULOSE 10 GM/15ML PO SOLN
20.0000 g | Freq: Two times a day (BID) | ORAL | Status: DC
  Administered 2023-03-28 – 2023-04-02 (×9): 20 g via ORAL
  Filled 2023-03-28 (×10): qty 30

## 2023-03-28 MED ORDER — MAGNESIUM SULFATE 2 GM/50ML IV SOLN
2.0000 g | Freq: Once | INTRAVENOUS | Status: AC
  Administered 2023-03-28: 2 g via INTRAVENOUS
  Filled 2023-03-28: qty 50

## 2023-03-28 NOTE — Evaluation (Signed)
Occupational Therapy Evaluation Patient Details Name: [name] MRN: [id number] DOB: 12/03/1955 Today's Date: 03/28/2023   History of Present Illness Pt is a 68 y.o. male admitted with recurrent LE DVT and acute on chronic diastolic CHF causing progressive SOB. PMH significant for prostate cancer, PE, bilateral AKA, HTN, hypokalemia, hypomagnesemia, pressure ulcers, thoracic aneurysm and anxiety/depression. Recently hospitalized 1/2-1/6 for acute L sided DVT.   Clinical Impression   Pt admitted with above diagnosis. Reports he returned home after last admission, and had a hospital bed installed. Pt lives with dog in wc accessible home, mod independent for ADLs and uses a power wc for mobility. Pt currently requires supervision to perform lateral scoots sitting EOB (movements effortful and pt quickly fatigues), modA for bed level LB ADLs, supervision for seated EOB UB ADLs. Anticipate +2 for transfer attempts. Pt would benefit from skilled OT services to address noted impairments and functional limitations (see below for any additional details) in order to maximize safety and independence while minimizing falls risk and caregiver burden. Anticipate the need for follow up [facility] OT services upon acute hospital DC.        If plan is discharge home, recommend the following: Two people to help with walking and/or transfers;A lot of help with bathing/dressing/bathroom;Assistance with cooking/housework;Assist for transportation;Help with stairs or ramp for entrance    Functional Status Assessment  Patient has had a recent decline in their functional status and demonstrates the ability to make significant improvements in function in a reasonable and predictable amount of time.  Equipment Recommendations  None recommended by OT       Precautions / Restrictions Restrictions Weight Bearing Restrictions Per Provider Order: No      Mobility Bed Mobility Overal bed mobility: Needs Assistance        Supine to sit: Supervision, HOB elevated, Used rails     General bed mobility comments: movements effortful; increased time, pt states difficult due to fluid buildup and swelling    Transfers                   General transfer comment: NT, transport arriving to take pt for imaging      Balance Overall balance assessment: Needs assistance Sitting-balance support: No upper extremity supported, Feet unsupported Sitting balance-Leahy Scale: Good Sitting balance - Comments: no LOB noted for ~10 mins sitting EOB unsupported                                   ADL either performed or assessed with clinical judgement   ADL Overall ADL's : Needs assistance/impaired                                       General ADL Comments: Anticipate able to complete UB ADLs without physical assistance sitting EOB. MOD A bed level for rolling for LB ADLs. Transfers not performed this date, likely +2 for transfer attempts for safety      Pertinent Vitals/Pain Pain Assessment Pain Assessment: Faces Faces Pain Scale: Hurts even more Pain Location: phantom limb pain in residual limbs Pain Descriptors / Indicators: Grimacing, Guarding, Aching Pain Intervention(s): Limited activity within patient's tolerance, Monitored during session        Communication Communication Communication: No apparent difficulties   Cognition Arousal: Alert Behavior During Therapy: WFL for tasks assessed/performed Overall Cognitive Status: Within  Functional Limits for tasks assessed                                                  Home Living Family/patient expects to be discharged to:: Private residence Living Arrangements: Alone Available Help at Discharge: Friend(s);Available PRN/intermittently Type of Home: House Home Access: Ramped entrance;Other (comment)     Home Layout: Two level;Able to live on main level with bedroom/bathroom Alternate Level  Stairs-Number of Steps: has stair lift chair   Bathroom Shower/Tub: Walk-in shower (roll in shower)   Bathroom Toilet: Handicapped height Bathroom Accessibility: Yes How Accessible: Accessible via wheelchair Home Equipment: Shower seat;Grab bars - toilet;Grab bars - tub/shower;Wheelchair - Engineer, technical sales - power;Hospital bed   Additional Comments: home is wheelchair accessible throughout      Prior Functioning/Environment Prior Level of Function : Independent/Modified Independent             Mobility Comments: w/c level, mod indep, has a gym in his garage ADLs Comments: mod indep        OT Problem List: Decreased strength;Decreased activity tolerance;Obesity;Impaired balance (sitting and/or standing)      OT Treatment/Interventions: Self-care/ADL training;Therapeutic exercise;Therapeutic activities;DME and/or AE instruction;Patient/family education;Balance training    OT Goals(Current goals can be found in the care plan section) Acute Rehab OT Goals OT Goal Formulation: With patient Time For Goal Achievement: 04/11/23 Potential to Achieve Goals: Good  OT Frequency: Min 1X/week       AM-PAC OT "6 Clicks" Daily Activity     Outcome Measure Help from another person eating meals?: None Help from another person taking care of personal grooming?: None Help from another person toileting, which includes using toliet, bedpan, or urinal?: Total Help from another person bathing (including washing, rinsing, drying)?: A Lot Help from another person to put on and taking off regular upper body clothing?: A Little Help from another person to put on and taking off regular lower body clothing?: A Lot 6 Click Score: 16   End of Session    Activity Tolerance: Patient tolerated treatment well Patient left: in bed (transport staff arriving)  OT Visit Diagnosis: Other abnormalities of gait and mobility (R26.89);Muscle weakness (generalized) (M62.81)                Time:  1610-9604 OT Time Calculation (min): 15 min Charges:  OT General Charges $OT Visit: 1 Visit OT Evaluation $OT Eval Low Complexity: 1 Low  Tasheema Perrone L. Kathryn Linarez, OTR/L  03/28/23, 12:53 PM

## 2023-03-28 NOTE — Evaluation (Signed)
Physical Therapy Evaluation Patient Details Name: [name] MRN: [id number] DOB: 1955/11/15 Today's Date: 03/28/2023  History of Present Illness  Pt is a 68 y.o. male presenting to hospital 03/22/23 with c/o whole body feeling swollen.  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  PMH includes htn, recurrent DVT and PE, recent acute L leg DVT, prostate CA, cellulitis R leg, lymphedema, B AKA, pressure ulcers, R hip sx, neck sx, R shoulder sx, spine sx.  Clinical Impression  Prior to recent medical concerns, pt was modified independent with w/c level transfers; lives alone on main level of home; home modified for pt's needs.  Currently pt is modified independent semi-supine to sitting EOB; pt able to reposition on EOB on own; able to sit on EOB about 10 minutes (OT then arrived for OT evaluation).  Pt declined transfer attempt to recliner d/t brief intermittent phantom limb pain B LE's (nurse updated on pt's pain status).  Pt reporting that if his overall swelling continues to decrease, he anticipates no issues with being able to safely discharge home and return to PLOF.  Pt would currently benefit from skilled PT to address noted impairments and functional limitations (see below for any additional details).  Upon hospital discharge, pt would benefit from ongoing therapy.     If plan is discharge home, recommend the following: A little help with walking and/or transfers;A little help with bathing/dressing/bathroom;Assist for transportation   Can travel by private vehicle        Equipment Recommendations Other (comment) (pt has needed DME at home)  Recommendations for Other Services       Functional Status Assessment Patient has had a recent decline in their functional status and demonstrates the ability to make significant improvements in function in a reasonable and predictable amount of time.     Precautions / Restrictions  Precautions Precautions: Fall Precaution Comments: B AKA Restrictions Weight Bearing Restrictions Per Provider Order: No      Mobility  Bed Mobility Overal bed mobility: Needs Assistance Bed Mobility: Supine to Sit     Supine to sit: Modified independent (Device/Increase time), HOB elevated     General bed mobility comments: use of bed rails; increased effort to perform on own (d/t overall body swelling)    Transfers Overall transfer level: Needs assistance Equipment used: None Transfers: Bed to chair/wheelchair/BSC             General transfer comment: pt able to scoot a little along bed to reposition self; pt declined transfer to chair (d/t B phantom limb pain)    Ambulation/Gait                  Stairs            Wheelchair Mobility     Tilt Bed    Modified Rankin (Stroke Patients Only)       Balance Overall balance assessment: Needs assistance Sitting-balance support: No upper extremity supported, Feet unsupported Sitting balance-Leahy Scale: Good Sitting balance - Comments: Steady sitting on EOB approximately 10 minutes                                     Pertinent Vitals/Pain Pain Assessment Pain Assessment: 0-10 Pain Score:  (0/10 at rest; 10/10 briefly with phantom pain B LE's) Pain Location: phantom limb pain in residual limbs Pain Descriptors / Indicators: Grimacing, Guarding,  Sharp, Shooting Pain Intervention(s): Limited activity within patient's tolerance, Monitored during session, Repositioned (RN notified)    Home Living Family/patient expects to be discharged to:: Private residence Living Arrangements: Alone Available Help at Discharge: Friend(s);Available PRN/intermittently Type of Home: House Home Access: Ramped entrance;Other (comment) (Platform for elevating lift in garage)     Alternate Level Stairs-Number of Steps: has stair lift chair Home Layout: Two level;Able to live on main level with  bedroom/bathroom Home Equipment: Shower seat;Grab bars - toilet;Grab bars - tub/shower;Wheelchair - Engineer, technical sales - power;Hospital bed (2 power w/c's) Additional Comments: Home is wheelchair accessible throughout; has Life alert.    Prior Function Prior Level of Function : Independent/Modified Independent             Mobility Comments: Modified independent w/c level; has a gym in his garage ADLs Comments: Modified independent.  Does 180 degree turn in power w/c before transferring to toilet (has multiple grab bars to use)     Extremity/Trunk Assessment   Upper Extremity Assessment Upper Extremity Assessment: Defer to OT evaluation;Overall [facility] for tasks assessed    Lower Extremity Assessment RLE Deficits / Details: at least 3/5 AROM hip flexion and aBD/aDduction AKA LLE Deficits / Details: at least 3/5 AROM hip flexion and aBD/aDduction AKA    Cervical / Trunk Assessment Cervical / Trunk Assessment: Normal  Communication   Communication Communication: No apparent difficulties Cueing Techniques: Verbal cues  Cognition Arousal: Alert Behavior During Therapy: WFL for tasks assessed/performed Overall Cognitive Status: Within Functional Limits for tasks assessed                                          General Comments  Pt agreeable to PT session.    Exercises     Assessment/Plan    PT Assessment Patient needs continued PT services  PT Problem List Decreased strength;Decreased activity tolerance;Decreased balance;Decreased mobility;Pain       PT Treatment Interventions DME instruction;Functional mobility training;Therapeutic activities;Therapeutic exercise;Balance training;Patient/family education    PT Goals (Current goals can be found in the Care Plan section)  Acute Rehab PT Goals Patient Stated Goal: to return to PLOF PT Goal Formulation: With patient Time For Goal Achievement: 04/11/23 Potential to Achieve Goals: Good    Frequency Min  1X/week     Co-evaluation               AM-PAC PT "6 Clicks" Mobility  Outcome Measure Help needed turning from your back to your side while in a flat bed without using bedrails?: None Help needed moving from lying on your back to sitting on the side of a flat bed without using bedrails?: A Little Help needed moving to and from a bed to a chair (including a wheelchair)?: A Little Help needed standing up from a chair using your arms (e.g., wheelchair or bedside chair)?: Total Help needed to walk in hospital room?: Total Help needed climbing 3-5 steps with a railing? : Total 6 Click Score: 13    End of Session   Activity Tolerance: Patient tolerated treatment well;Patient limited by pain Patient left:  (sitting on EOB with OT present) Nurse Communication: Mobility status;Precautions;Other (comment) (Pt's phantom limb pain status) PT Visit Diagnosis: Other abnormalities of gait and mobility (R26.89)    Time: 4696-2952 PT Time Calculation (min) (ACUTE ONLY): 19 min   Charges:   PT Evaluation $PT Eval Low Complexity: 1 Low  PT General Charges $$ ACUTE PT VISIT: 1 Visit       Hendricks Limes, PT 03/28/23, 3:07 PM

## 2023-03-28 NOTE — Consult Note (Signed)
PHARMACY - ANTICOAGULATION CONSULT NOTE  Pharmacy Consult for warfarin & enoxaparin Indication: DVT  Allergies  Allergen Reactions   Bacitracin Hives   Vancomycin Other (See Comments) and Rash    Red man syndrome.   Ketorolac     Other Reaction(s): Kidney Disorder  Other reaction(s): Kidney Disorder   Spironolactone     Other Reaction(s): Unknown  Other reaction(s): Unknown   Patient Measurements: Height: 4\' 10"  (147.3 cm) Weight: (!) 149.2 kg (328 lb 14.8 oz) IBW/kg (Calculated) : 45.4  Vital Signs: Temp: 98.4 F (36.9 C) (01/23 0355) Temp Source: Oral (01/23 0355) BP: 118/62 (01/23 0355) Pulse Rate: 70 (01/23 0355)  Labs: Recent Labs    03/26/23 0507 03/26/23 1723 03/27/23 0458 03/28/23 0458  HGB  --   --  10.6* 10.9*  HCT  --   --  32.1* 33.1*  PLT  --   --  163 175  LABPROT 16.7*  --  16.6* 16.8*  INR 1.3*  --  1.3* 1.3*  CREATININE 0.64 0.57* 0.54* 0.70   Estimated Creatinine Clearance: 110.1 mL/min (by C-G formula based on SCr of 0.7 mg/dL).  Medical History: Past Medical History:  Diagnosis Date   Depression    History of blood clots 08/13/2011   History of left knee replacement    HTN (hypertension)    Lymphedema    lt leg-per pt   PE (pulmonary thromboembolism) (HCC)    Pressure injury of skin, unspecified injury stage, unspecified location    S/P AKA (above knee amputation) bilateral (HCC)    rt 11/24/07 and lt 01/11/17   Warfarin INR 1.2 on 03/19/2023 after 2 weeks on warfarin 5 mg daily. Per note review - warfarin dose was changed to 7.5 mg three times a week and 5 mg on the other four days.  INR on admission 1.3 (03/23/2023). Therapeutic enoxaparin 1mg /kg q 12hr initiated as well.  Assessment: PMH significant of stage IV prostate cancer, recurrent DVT and PE, lymphedema, hypertension, status post AKA bilaterally, pressure ulcers. Patient presents with recurrent DVT despite eliquis use. INR sub-therapeutic on admission and sub-therapeutic on  last outpatient check as well. Pharmacy has been consulted to manage this patient's warfarin.   Goal of Therapy:  INR 2-3 Monitor platelets by anticoagulation protocol: Yes  Warfarin Date@Time  INR Clinical Assessment Warfarin Dose   1/18 1.3 SUBtherapeutic ---  1/19 ---  7.5 mg  1/20 1.3 SUBtherapeutic 10 mg   1/21 1.3  SUBtherapeutic 10 mg  1/22 1.3 SUBtherapeutic 15 mg   1/23 1.3 SUBtherapeutic Plan for 15 mg   Plan:  INR remains at 1.3 today  Will plan for warfarin 15 mg x 1 dose tonight given no movement in INR  Continue enoxaparin 1mg /kg Q12H for a minimum of 5 days and until INR > 2 for 24 hours No drug-drug interactions with current regimen  Daily INR for now CBC stable; monitor at least every 72 hours   Littie Deeds, PharmD Pharmacy Resident  03/28/2023 7:04 AM

## 2023-03-28 NOTE — Plan of Care (Signed)

## 2023-03-28 NOTE — Progress Notes (Signed)
Progress Note   Patient: [name] WUJ:[id number] DOB: 05-13-1955 DOA: 03/23/2023     5 DOS: the patient was seen and examined on 03/28/2023   Brief hospital course:  [name] is a 68 y.o. male with medical history significant of stage IV prostate cancer, recurrent DVT and PE, lymphedema, hypertension, status post AKA bilaterally, pressure ulcers presenting with recurrent DVT.  Patient noted to have been admitted January 2 January 6 for acute left-sided DVT.  Had DVT recurrence despite Eliquis use.  Was transition from heparin to Lovenox at discharge.  Patient states he was unable to afford it and was subsequently transition to Coumadin by PCP.  Coumadin Level has been subtherapeutic per report.  Has had worsening shortness of breath lower extremity swelling and pain x 2 to 3 days.  Noted baseline bilateral AKA.  Mild orthopnea.  No chest pain.  No fevers or chills.  Baseline stage IV prostate cancer.  Followed by Dr. Donneta Romberg.  Non-smoker. Presented to the ER afebrile, hemodynamically stable.  Satting well on room air.  White count 4.4, hemoglobin 10.4, platelets 230, INR 1.3, COVID flu and RSV negative.  Creatinine 0.87.  BNP 140.  Chest x-ray with cardiomegaly.  Lower extremity ultrasound with occlusive thrombus in the proximal segment of right femoral vein.  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 Review of Systems: As mentioned in the history of present illness. All other systems reviewed and are negative.     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 High thromboembolism risk in the setting of underlying active malignancy Noted recent for similar issues January 2 to January 6 and discharged on Lovenox Was unable to afford Lovenox and transitioned to  Coumadin in outpt setting without the bridge INR remains subtherapeutic at 1.3 Chest CT is negative for PE --Continue Lovenox bridge >> Coumadin dosing per pharmacy  --Daily INR's (staying 1.3 now several days)     Hypokalemia Hypomagnesemia Severe Secondary to diuretic therapy as well as GI losses from diarrhea --Supplement potassium and magnesium     Acute on chronic diastolic CHF Patient with complaints of fluid overload, excessive weight gain in both thighs and scrotum Progressive shortness of breath and generalized swelling in the setting of active malignancy and recurrent DVT Positive cardiomegaly on chest x-ray BNP 140 2D echo shows an LVEF of 55 - 60%. Normal RV size and function Concern for possible Cor Pulmonale related to recurrent VTE, but CTA chest on admission was negative for pulmonary emboli, could have tiny clots not detected on CT.  He continues to have progressive peripheral edema. --Continue IV Lasix 40 mg BID  --Resumed Lisinopril at 10mg  daily --Strict Io's, daily weights --Monitor renal function, electrolytes    Diarrhea Abdominal pain - post bowel movements, suspect due to retained stool in colon CT abd/pelvis w/o contrast on 1/21 - no acute findings but showed moderate stool burden in the colon.  Suspect diarrhea is 'overflow' stool passing around retained solid stool. GI pathogen panel is negative. Very low suspicion C diff given low frequency of episodes, no leukocytosis, no fevers 1/23: Repeat xray abdomen today shows persistent stool burden and colonic distention (?ileus) --Avoid anti-diarrheals --Start lactulose 20 g BID, Senna-S BID --Encourage PO hydration --Stop simethicone trial - was not helpful    Prostate cancer metastatic to bone (HCC)  Baseline stage 4 metastatic prostate with bone involvement  --Followed by Dr. Donneta Romberg  --Waldemar Dickens while inpatient    Essential hypertension --Stable, monitor    Pressure ulcers of skin of  multiple topographic sites Chronic due to poor ambulation --Wound care consulted, appreciate input    Anxiety/Depression --Continue Bupropion    Morbid Obesity BMI 62 Patient is s/p bilateral AKA --Wheel chair bound but fully independent at home  --Will ask PT/OT to follow for maintenance of strength for independent transfers    Ascending thoracic aneurysm Measures 5cm Patient aware of aneurysm but has not seen vascular surgery in years Appreciate vascular surgery input.  Recommends to follow-up with CT surgery as an outpatient     Stage III pressure injury Involving the ischial tuberosity I agree with the wound description/s as outlined. --Continue diligent wound care, frequent off-loading, monitor for s/sx's of infection  Pressure Injury 03/23/23 Ischial tuberosity Left Stage 3 -  Full thickness tissue loss. Subcutaneous fat may be visible but bone, tendon or muscle are NOT exposed. 1 cm x 1 cm x 0.1 cm 50% pink moist 50% yellow (Active)  03/23/23   Location: Ischial tuberosity  Location Orientation: Left  Staging: Stage 3 -  Full thickness tissue loss. Subcutaneous fat may be visible but bone, tendon or muscle are NOT exposed.  Wound Description (Comments): 1 cm x 1 cm x 0.1 cm 50% pink moist 50% yellow  Present on Admission: Yes     Pressure Injury 03/26/23 Buttocks Left;Lateral Stage 2 -  Partial thickness loss of dermis presenting as a shallow open injury with a red, pink wound bed without slough. (Active)  03/26/23 0000  Location: Buttocks  Location Orientation: Left;Lateral  Staging: Stage 2 -  Partial thickness loss of dermis presenting as a shallow open injury with a red, pink wound bed without slough.  Wound Description (Comments):   Present on Admission: Yes           Subjective: Pt reports several diarrhea episodes overnight.  Abdomen still very distended.  Not much improvement in swelling but good urine output.  Having a lot of increased phantom limb  pain today, noted his home gabapentin had not been resumed yet.  Notes no improvement in abdominal pains with simethicone.     Physical Exam: Vitals:   03/28/23 0500 03/28/23 0845 03/28/23 1216 03/28/23 1546  BP:  117/82 (!) 145/79 125/79  Pulse:  66 66 65  Resp:  20 18 18   Temp:  99.1 F (37.3 C) 98.2 F (36.8 C) 98.5 F (36.9 C)  TempSrc:      SpO2:  100% 100% 95%  Weight: (!) 149.2 kg     Height:        General exam: awake, alert, no acute distress HEENT: wearing glasses, moist mucus membranes, hearing grossly normal  Respiratory system: CTAB, no wheezes, rales or rhonchi, normal respiratory effort. Cardiovascular system: normal S1/S2, RRR, lower extremity edema >> proximally including scrotal and truncal edema which appears very slightly improved Gastrointestinal system: distended, non-tender Central nervous system: A&O x 4. no gross focal neurologic deficits, normal speech Extremities: B/L AKA's, BLE tense edema but not weaping Skin: dry, intact, normal temperature Psychiatry: normal mood, congruent affect, judgement and insight appear normal    Data Reviewed: Notable labs --  K 2.9 >> 3.1 >> 2.9 BUN 6 Cr 0.54 >> 0.70 WBC 2.2 >> 2.3 Hbg 10.6 >> 10.9  INR 1.3 unchanged  GI panel negative   Family Communication: None present.  Pt is able to update.   Disposition: Status is: Inpatient Remains inpatient appropriate because: remains on IV diuretics, and requiring daily potassium supplementation with close lab monitoring.  Not yet medically stable for d/c.   Planned Discharge Destination: Home with Home Health    Time spent: 45 minutes  Author: Pennie Banter, DO 03/28/2023 3:59 PM  For on call review [email or web address].

## 2023-03-29 ENCOUNTER — Inpatient Hospital Stay: Payer: Medicare PPO

## 2023-03-29 ENCOUNTER — Telehealth: Payer: Self-pay | Admitting: *Deleted

## 2023-03-29 DIAGNOSIS — C7951 Secondary malignant neoplasm of bone: Secondary | ICD-10-CM

## 2023-03-29 DIAGNOSIS — R0602 Shortness of breath: Secondary | ICD-10-CM

## 2023-03-29 DIAGNOSIS — I82403 Acute embolism and thrombosis of unspecified deep veins of lower extremity, bilateral: Secondary | ICD-10-CM | POA: Diagnosis not present

## 2023-03-29 DIAGNOSIS — I10 Essential (primary) hypertension: Secondary | ICD-10-CM

## 2023-03-29 DIAGNOSIS — R197 Diarrhea, unspecified: Secondary | ICD-10-CM | POA: Diagnosis not present

## 2023-03-29 LAB — CBC
HCT: 33.5 % — ABNORMAL LOW (ref 39.0–52.0)
Hemoglobin: 10.8 g/dL — ABNORMAL LOW (ref 13.0–17.0)
MCH: 31.9 pg (ref 26.0–34.0)
MCHC: 32.2 g/dL (ref 30.0–36.0)
MCV: 98.8 fL (ref 80.0–100.0)
Platelets: 170 10*3/uL (ref 150–400)
RBC: 3.39 MIL/uL — ABNORMAL LOW (ref 4.22–5.81)
RDW: 13.7 % (ref 11.5–15.5)
WBC: 2.1 10*3/uL — ABNORMAL LOW (ref 4.0–10.5)
nRBC: 0 % (ref 0.0–0.2)

## 2023-03-29 LAB — PROTIME-INR
INR: 1.6 — ABNORMAL HIGH (ref 0.8–1.2)
Prothrombin Time: 19.2 s — ABNORMAL HIGH (ref 11.4–15.2)

## 2023-03-29 LAB — BASIC METABOLIC PANEL
Anion gap: 11 (ref 5–15)
BUN: 9 mg/dL (ref 8–23)
CO2: 30 mmol/L (ref 22–32)
Calcium: 9.4 mg/dL (ref 8.9–10.3)
Chloride: 99 mmol/L (ref 98–111)
Creatinine, Ser: 0.7 mg/dL (ref 0.61–1.24)
GFR, Estimated: >60 mL/min (ref >60)
Glucose, Bld: 95 mg/dL (ref 70–99)
Potassium: 3.5 mmol/L (ref 3.5–5.1)
Sodium: 140 mmol/L (ref 135–145)

## 2023-03-29 MED ORDER — WARFARIN SODIUM 7.5 MG PO TABS
15.0000 mg | ORAL_TABLET | Freq: Once | ORAL | Status: AC
  Administered 2023-03-29: 15 mg via ORAL
  Filled 2023-03-29: qty 2

## 2023-03-29 MED ORDER — POTASSIUM CHLORIDE CRYS ER 20 MEQ PO TBCR
40.0000 meq | EXTENDED_RELEASE_TABLET | Freq: Once | ORAL | Status: AC
  Administered 2023-03-29: 40 meq via ORAL
  Filled 2023-03-29: qty 2

## 2023-03-29 MED ORDER — DAROLUTAMIDE 300 MG PO TABS: 600.0000 mg | ORAL_TABLET | Freq: Two times a day (BID) | ORAL | 1 refills | Status: DC

## 2023-03-29 NOTE — Plan of Care (Signed)
  Problem: Education: Goal: Knowledge of General Education information will improve Description: Including pain rating scale, medication(s)/side effects and non-pharmacologic comfort measures Outcome: Progressing   Problem: Health Behavior/Discharge Planning: Goal: Ability to manage health-related needs will improve Outcome: Progressing   Problem: Clinical Measurements: Goal: Ability to maintain clinical measurements within normal limits will improve Outcome: Progressing Goal: Will remain free from infection Outcome: Progressing   Problem: Coping: Goal: Level of anxiety will decrease Outcome: Progressing   Problem: Elimination: Goal: Will not experience complications related to urinary retention Outcome: Progressing   Problem: Pain Managment: Goal: General experience of comfort will improve and/or be controlled Outcome: Progressing   Problem: Safety: Goal: Ability to remain free from injury will improve Outcome: Progressing   Problem: Skin Integrity: Goal: Risk for impaired skin integrity will decrease Outcome: Progressing

## 2023-03-29 NOTE — Consult Note (Signed)
PHARMACY - ANTICOAGULATION CONSULT NOTE  Pharmacy Consult for warfarin & enoxaparin Indication: DVT  Allergies  Allergen Reactions   Bacitracin Hives   Vancomycin Other (See Comments) and Rash    Red man syndrome.   Ketorolac     Other Reaction(s): Kidney Disorder  Other reaction(s): Kidney Disorder   Spironolactone     Other Reaction(s): Unknown  Other reaction(s): Unknown   Patient Measurements: Height: 4\' 10"  (147.3 cm) Weight: (!) 149.2 kg (328 lb 14.8 oz) IBW/kg (Calculated) : 45.4  Vital Signs: Temp: 98.3 F (36.8 C) (01/24 0320) BP: 108/72 (01/24 0320) Pulse Rate: 64 (01/24 0320)  Labs: Recent Labs    03/27/23 0458 03/28/23 0458 03/29/23 0500  HGB 10.6* 10.9* 10.8*  HCT 32.1* 33.1* 33.5*  PLT 163 175 170  LABPROT 16.6* 16.8* 19.2*  INR 1.3* 1.3* 1.6*  CREATININE 0.54* 0.70 0.70   Estimated Creatinine Clearance: 110.1 mL/min (by C-G formula based on SCr of 0.7 mg/dL).  Medical History: Past Medical History:  Diagnosis Date   Depression    History of blood clots 08/13/2011   History of left knee replacement    HTN (hypertension)    Lymphedema    lt leg-per pt   PE (pulmonary thromboembolism) (HCC)    Pressure injury of skin, unspecified injury stage, unspecified location    S/P AKA (above knee amputation) bilateral (HCC)    rt 11/24/07 and lt 01/11/17   Warfarin INR 1.2 on 03/19/2023 after 2 weeks on warfarin 5 mg daily. Per note review - warfarin dose was changed to 7.5 mg three times a week and 5 mg on the other four days.  INR on admission 1.3 (03/23/2023). Therapeutic enoxaparin 1mg /kg q 12hr initiated as well.  Assessment: PMH significant of stage IV prostate cancer, recurrent DVT and PE, lymphedema, hypertension, status post AKA bilaterally, pressure ulcers. Patient presents with recurrent DVT despite eliquis use. INR sub-therapeutic on admission and sub-therapeutic on last outpatient check as well. Pharmacy has been consulted to manage this  patient's warfarin.   Goal of Therapy:  INR 2-3 Monitor platelets by anticoagulation protocol: Yes  Warfarin:  Date INR Clinical Assessment Warfarin Dose   1/18 1.3 SUBtherapeutic ---  1/19 ---  7.5 mg  1/20 1.3 SUBtherapeutic 10 mg   1/21 1.3  SUBtherapeutic 10 mg  1/22 1.3 SUBtherapeutic 15 mg   1/23 1.3 SUBtherapeutic 15 mg  1/24 1.6 SUBtherapeutic 15 mg   Plan:  INR increased to 1.6 today  Continue with warfarin 15 mg x 1 dose tonight as INR remains subtherapeutic  Continue enoxaparin 1mg /kg Q12H for a minimum of 5 days and until INR > 2 for 24 hours No drug-drug interactions with current regimen  Daily INR for now CBC stable; monitor at least every 72 hours   Littie Deeds, PharmD Pharmacy Resident  03/29/2023 7:14 AM

## 2023-03-29 NOTE — Plan of Care (Signed)

## 2023-03-29 NOTE — Progress Notes (Signed)
Progress Note   Patient: [name] ZOX:[id number] DOB: [birth year] DOA: 03/23/2023     6 DOS: the patient was seen and examined on 03/29/2023   Brief hospital course:  Eufemio Strahm is a 68 y.o. male with medical history significant of stage IV prostate cancer, recurrent DVT and PE, lymphedema, hypertension, status post AKA bilaterally, pressure ulcers presenting with recurrent DVT.  Patient noted to have been admitted January 2 January 6 for acute left-sided DVT.  Had DVT recurrence despite Eliquis use.  Was transition from heparin to Lovenox at discharge.  Patient states he was unable to afford it and was subsequently transition to Coumadin by PCP.  Coumadin Level has been subtherapeutic per report.  Has had worsening shortness of breath lower extremity swelling and pain x 2 to 3 days.  Noted baseline bilateral AKA.  Mild orthopnea.  No chest pain.  No fevers or chills.  Baseline stage IV prostate cancer.  Followed by Dr. Donneta Romberg.  Non-smoker. Presented to the ER afebrile, hemodynamically stable.  Satting well on room air.  White count 4.4, hemoglobin 10.4, platelets 230, INR 1.3, COVID flu and RSV negative.  Creatinine 0.87.  BNP 140.  Chest x-ray with cardiomegaly.  Lower extremity ultrasound with occlusive thrombus in the proximal segment of right femoral vein.  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 Review of Systems: As mentioned in the history of present illness. All other systems reviewed and are negative.     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 High thromboembolism risk in the setting of underlying active malignancy Noted recent for similar issues January 2 to January 6 and discharged on Lovenox Was unable to afford Lovenox and transitioned to  Coumadin in outpt setting without the bridge INR remains subtherapeutic at 1.3 Chest CT is negative for PE --Continue Lovenox bridge >> Coumadin dosing per pharmacy  --Daily INR's (1.3 >> 1.6 today after several days 1.3)     Hypokalemia Hypomagnesemia Severe 1/24: improved K 2.9 >> 3.5 after four doses 40 mEq given yesterday Secondary to diuretic therapy as well as GI losses from diarrhea --Supplement potassium and magnesium     Acute on chronic diastolic CHF Patient with complaints of fluid overload, excessive weight gain in both thighs and scrotum Progressive shortness of breath and generalized swelling in the setting of active malignancy and recurrent DVT Positive cardiomegaly on chest x-ray BNP 140 2D echo shows an LVEF of 55 - 60%. Normal RV size and function Concern for possible Cor Pulmonale related to recurrent VTE, but CTA chest on admission was negative for pulmonary emboli, could have tiny clots not detected on CT.  He continues to have progressive peripheral edema. 1/24 - renal function stable and edema is persistent but improving. --Continue IV Lasix 40 mg BID  --Resumed Lisinopril at 10mg  daily --Strict Io's, daily weights --Monitor renal function, electrolytes    Colonic Ileus -  Diarrhea - mucus liquid stools Abdominal pain - post bowel movements, suspect due to retained stool in colon CT abd/pelvis w/o contrast on 1/21 - no acute findings but showed moderate stool burden in the colon.  Suspect diarrhea is 'overflow' stool passing around retained solid stool. GI pathogen panel is negative. Very low suspicion C diff given low frequency of episodes, no leukocytosis, no fevers 1/23: Repeat xray abdomen today shows  persistent stool burden and colonic distention (?ileus) --Avoid anti-diarrheals --Start lactulose 20 g BID, Senna-S BID --Encourage PO hydration --Stop simethicone trial - was not helpful --No N/V - diet is okay to continue --If worsening, will  consult surgery to follow --Portable KUB's periodically to monitor    Prostate cancer metastatic to bone (HCC) Baseline stage 4 metastatic prostate with bone involvement  --Followed by Dr. 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  Recommends to follow-up with CT surgery as an outpatient     Stage III pressure injury Involving the ischial tuberosity I agree with the wound description/s as outlined. --Continue diligent wound care, frequent off-loading, monitor for s/sx's of infection  Pressure Injury 03/23/23 Ischial tuberosity Left Stage 3 -  Full thickness tissue loss. Subcutaneous fat may be visible but bone, tendon or muscle are NOT exposed. 1 cm x 1 cm x 0.1 cm 50% pink moist 50% yellow (Active)  03/23/23   Location: Ischial tuberosity  Location Orientation: Left  Staging: Stage 3 -  Full thickness tissue loss. Subcutaneous fat may be visible but bone, tendon or muscle are NOT exposed.  Wound Description (Comments): 1 cm x 1 cm x 0.1 cm 50% pink moist 50% yellow  Present on Admission: Yes     Pressure Injury 03/26/23 Buttocks Left;Lateral Stage 2 -  Partial thickness loss of dermis presenting as a shallow open injury with a red, pink wound bed without slough. (Active)  03/26/23 0000  Location: Buttocks  Location Orientation: Left;Lateral  Staging: Stage 2 -  Partial thickness loss of dermis presenting as a shallow  open injury with a red, pink wound bed without slough.  Wound Description (Comments):   Present on Admission: Yes           Subjective: Pt continues to have abdominal distension and mucus liquid stools, no brown stool material yet.  Still feels very swollen and uncomfortable.  Still unable to tolerate laying flat due to shortness of breath which is chronic.  Admits to sleep apnea, does not use CPAP machine.      Physical Exam: Vitals:   03/28/23 2309 03/29/23 0320 03/29/23 0802 03/29/23 1228  BP: 109/65 108/72 108/66 112/63  Pulse: 64 64 66 72  Resp: 20 20 16 16   Temp: 98 F (36.7 C) 98.3 F (36.8 C) 98.8 F (37.1 C) 98.4 F (36.9 C)  TempSrc:   Oral Oral  SpO2: 97% 94% 94% 96%  Weight:      Height:        General exam: awake, alert, no acute distress HEENT: wearing glasses, moist mucus membranes, hearing grossly normal  Respiratory system: CTAB, no wheezes, rales or rhonchi, normal respiratory effort. Cardiovascular system: normal S1/S2, RRR, persistent lower extremity edema, scrotal edema is notable improved Gastrointestinal system: distended, non-tender Genitourinary system: scrotal edema is persistent but improving slowly Central nervous system: A&O x 4. no gross focal neurologic deficits, normal speech Extremities: B/L AKA's, BLE and truncal edema Skin: dry, intact,  normal temperature Psychiatry: normal mood, congruent affect, judgement and insight appear normal    Data Reviewed: Notable labs --  K 2.9 >> 3.1 >> 2.9 (given 40 mEq x 4 doses) >> 3.5 Cr stable  WBC 2.2 >> 2.3 >> 2.1 Hbg 10.6 >> 10.9 >> 10.8  INR 1.3 >> 1.6  GI panel negative   Family Communication: None present. Pt is able to update.   Disposition: Status is: Inpatient Remains inpatient appropriate because: remains on IV diuretics, and requiring daily potassium supplementation with close lab monitoring.  Not yet medically stable for d/c.   Planned Discharge Destination: Home with Home  Health    Time spent: 42 minutes  Author: Pennie Banter, DO 03/29/2023 1:16 PM  For on call review [email or web address].

## 2023-03-29 NOTE — Telephone Encounter (Signed)
Pharmacy requesting refill for Nubeqa.

## 2023-03-30 ENCOUNTER — Inpatient Hospital Stay: Payer: Medicare PPO

## 2023-03-30 DIAGNOSIS — K567 Ileus, unspecified: Secondary | ICD-10-CM | POA: Diagnosis present

## 2023-03-30 DIAGNOSIS — C7951 Secondary malignant neoplasm of bone: Secondary | ICD-10-CM | POA: Diagnosis not present

## 2023-03-30 DIAGNOSIS — I82403 Acute embolism and thrombosis of unspecified deep veins of lower extremity, bilateral: Secondary | ICD-10-CM | POA: Diagnosis not present

## 2023-03-30 DIAGNOSIS — K5900 Constipation, unspecified: Secondary | ICD-10-CM | POA: Diagnosis not present

## 2023-03-30 DIAGNOSIS — C61 Malignant neoplasm of prostate: Secondary | ICD-10-CM | POA: Diagnosis not present

## 2023-03-30 LAB — CBC WITH DIFFERENTIAL/PLATELET
Abs Immature Granulocytes: 0 10*3/uL (ref 0.00–0.07)
Basophils Absolute: 0 10*3/uL (ref 0.0–0.1)
Basophils Relative: 1 %
Eosinophils Absolute: 0.1 10*3/uL (ref 0.0–0.5)
Eosinophils Relative: 7 %
HCT: 34.2 % — ABNORMAL LOW (ref 39.0–52.0)
Hemoglobin: 11 g/dL — ABNORMAL LOW (ref 13.0–17.0)
Immature Granulocytes: 0 %
Lymphocytes Relative: 18 %
Lymphs Abs: 0.4 10*3/uL — ABNORMAL LOW (ref 0.7–4.0)
MCH: 32.5 pg (ref 26.0–34.0)
MCHC: 32.2 g/dL (ref 30.0–36.0)
MCV: 101.2 fL — ABNORMAL HIGH (ref 80.0–100.0)
Monocytes Absolute: 0.4 10*3/uL (ref 0.1–1.0)
Monocytes Relative: 18 %
Neutro Abs: 1.2 10*3/uL — ABNORMAL LOW (ref 1.7–7.7)
Neutrophils Relative %: 56 %
Platelets: 158 10*3/uL (ref 150–400)
RBC: 3.38 MIL/uL — ABNORMAL LOW (ref 4.22–5.81)
RDW: 13.8 % (ref 11.5–15.5)
WBC: 2.2 10*3/uL — ABNORMAL LOW (ref 4.0–10.5)
nRBC: 0 % (ref 0.0–0.2)

## 2023-03-30 LAB — BASIC METABOLIC PANEL
Anion gap: 10 (ref 5–15)
BUN: 10 mg/dL (ref 8–23)
CO2: 30 mmol/L (ref 22–32)
Calcium: 9.1 mg/dL (ref 8.9–10.3)
Chloride: 99 mmol/L (ref 98–111)
Creatinine, Ser: 0.82 mg/dL (ref 0.61–1.24)
GFR, Estimated: >60 mL/min (ref >60)
Glucose, Bld: 97 mg/dL (ref 70–99)
Potassium: 3 mmol/L — ABNORMAL LOW (ref 3.5–5.1)
Sodium: 139 mmol/L (ref 135–145)

## 2023-03-30 LAB — PROTIME-INR
INR: 2.1 — ABNORMAL HIGH (ref 0.8–1.2)
Prothrombin Time: 23.8 s — ABNORMAL HIGH (ref 11.4–15.2)

## 2023-03-30 LAB — MAGNESIUM: Magnesium: 2.1 mg/dL (ref 1.7–2.4)

## 2023-03-30 MED ORDER — WARFARIN SODIUM 7.5 MG PO TABS
7.5000 mg | ORAL_TABLET | Freq: Once | ORAL | Status: AC
  Administered 2023-03-30: 7.5 mg via ORAL
  Filled 2023-03-30: qty 1

## 2023-03-30 MED ORDER — LORAZEPAM 2 MG/ML IJ SOLN: 0.5000 mg | Freq: Once | INTRAMUSCULAR | Status: DC | PRN

## 2023-03-30 MED ORDER — ALBUMIN HUMAN 5 % IV SOLN
12.5000 g | Freq: Two times a day (BID) | INTRAVENOUS | Status: AC
  Administered 2023-03-30 – 2023-03-31 (×2): 12.5 g via INTRAVENOUS
  Filled 2023-03-30 (×2): qty 250

## 2023-03-30 MED ORDER — HYDROCORTISONE 0.5 % EX CREA
TOPICAL_CREAM | Freq: Two times a day (BID) | CUTANEOUS | Status: DC
  Filled 2023-03-30: qty 28.35

## 2023-03-30 MED ORDER — POTASSIUM CHLORIDE CRYS ER 20 MEQ PO TBCR
40.0000 meq | EXTENDED_RELEASE_TABLET | ORAL | Status: AC
  Administered 2023-03-30 (×2): 40 meq via ORAL
  Filled 2023-03-30 (×2): qty 2

## 2023-03-30 MED ORDER — DIAZEPAM 5 MG PO TABS
5.0000 mg | ORAL_TABLET | Freq: Two times a day (BID) | ORAL | Status: DC | PRN
  Administered 2023-03-30: 5 mg via ORAL
  Filled 2023-03-30: qty 1

## 2023-03-30 NOTE — Progress Notes (Signed)
Progress Note   Patient: [name] ZOX:[id number] DOB: May 27, 1955 DOA: 03/23/2023     7 DOS: the patient was seen and examined on 03/30/2023   Brief hospital course:  [name] is a 68 y.o. male with medical history significant of stage IV prostate cancer, recurrent DVT and PE, lymphedema, hypertension, status post AKA bilaterally, pressure ulcers presenting with recurrent DVT.  Patient noted to have been admitted January 2 January 6 for acute left-sided DVT.  Had DVT recurrence despite Eliquis use.  Was transition from heparin to Lovenox at discharge.  Patient states he was unable to afford it and was subsequently transition to Coumadin by PCP.  Coumadin Level has been subtherapeutic per report.  Has had worsening shortness of breath lower extremity swelling and pain x 2 to 3 days.  Noted baseline bilateral AKA.  Mild orthopnea.  No chest pain.  No fevers or chills.  Baseline stage IV prostate cancer.  Followed by Dr. Donneta Romberg.  Non-smoker. Presented to the ER afebrile, hemodynamically stable.  Satting well on room air.  White count 4.4, hemoglobin 10.4, platelets 230, INR 1.3, COVID flu and RSV negative.  Creatinine 0.87.  BNP 140.  Chest x-ray with cardiomegaly.  Lower extremity ultrasound with occlusive thrombus in the proximal segment of right femoral vein.  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 Review of Systems: As mentioned in the history of present illness. All other systems reviewed and are negative.     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 High thromboembolism risk in the setting of underlying active malignancy Noted recent for similar issues January 2 to January 6 and discharged on Lovenox Was unable to afford Lovenox and transitioned to  Coumadin in outpt setting without the bridge INR remains subtherapeutic at 1.3 Chest CT is negative for PE --Continue Lovenox bridge >> Coumadin dosing per pharmacy  --Daily INR's (1.3 >> 1.6 today after several days 1.3)     Hypokalemia Hypomagnesemia Severe 1/24: improved K 2.9 >> 3.5 after four doses 40 mEq given yesterday 1/25: K 3.0 -- 40 mEq x 2 doses ordered Secondary to diuretic therapy as well as GI losses from diarrhea --Supplement potassium and magnesium PRN     Acute on chronic diastolic CHF Patient with complaints of fluid overload, excessive weight gain in both thighs and scrotum Progressive shortness of breath and generalized swelling in the setting of active malignancy and recurrent DVT Positive cardiomegaly on chest x-ray BNP 140 2D echo shows an LVEF of 55 - 60%. Normal RV size and function Concern for possible Cor Pulmonale related to recurrent VTE, but CTA chest on admission was negative for pulmonary emboli, could have tiny clots not detected on CT.  He continues to have progressive peripheral edema. 1/24 - renal function stable and edema is persistent but improving. --Continue IV Lasix 40 mg BID  --2 doses IV albumin ordered to precede Lasix today --Resumed Lisinopril at 10mg  daily --Strict Io's, daily weights --Monitor renal function, electrolytes    Colonic Ileus -  Diarrhea - mucus liquid stools Abdominal pain - post bowel movements, suspect due to retained stool in colon CT abd/pelvis w/o contrast on 1/21 - no acute findings but showed moderate stool burden in the colon.  Suspect diarrhea is 'overflow' stool passing around retained solid stool. GI pathogen panel is negative.  Very low suspicion C diff given low frequency of episodes, no leukocytosis, no fevers 1/23: Repeat xray abdomen today shows persistent stool burden and colonic distention (?ileus) 1/25: persistent ileus --Consult general surgery for input --To try enema today --Avoid  anti-diarrheals --Continue lactulose 20 g BID, Senna-S BID --Encourage PO hydration --No N/V - diet is okay to continue --Portable KUB's periodically to monitor    Prostate cancer metastatic to bone (HCC) Baseline stage 4 metastatic prostate with bone involvement  --Followed by Dr. 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   --Recommends to follow-up with CT surgery as an outpatient     Stage III pressure injury Involving the ischial tuberosity I agree with the wound description/s as outlined. --Continue diligent wound care, frequent off-loading, monitor for s/sx's of infection  Pressure Injury 03/23/23 Ischial tuberosity Left Stage 3 -  Full thickness tissue loss. Subcutaneous fat may be visible but bone, tendon or muscle are NOT exposed. 1 cm x 1 cm x 0.1 cm 50% pink moist 50% yellow (Active)  03/23/23   Location: Ischial tuberosity  Location Orientation: Left  Staging: Stage 3 -  Full thickness tissue loss. Subcutaneous fat may be visible but bone, tendon or muscle are NOT exposed.  Wound Description (Comments): 1 cm x 1 cm x 0.1 cm 50% pink moist 50% yellow  Present on Admission: Yes     Pressure Injury 03/26/23 Buttocks Left;Lateral Stage 2 -  Partial thickness loss of dermis presenting as a shallow open injury with a red, pink wound bed without slough. (Active)  03/26/23 0000  Location: Buttocks  Location  Orientation: Left;Lateral  Staging: Stage 2 -  Partial thickness loss of dermis presenting as a shallow open injury with a red, pink wound bed without slough.  Wound Description (Comments):   Present on Admission: Yes           Subjective: Pt still passing only clear mucus stools, twice overnight and pt was unaware during sleep.  No N/V, still no brown stool yet.  Pt seen by surgeon early and agreeable to trying enema, requests something to calm nerves beforehand.     Physical Exam: Vitals:   03/30/23 0443 03/30/23 0500 03/30/23 0902 03/30/23 1323  BP:   105/69 115/76  Pulse:   65 72  Resp:  16 16 16   Temp:   98.4 F (36.9 C) 98.7 F (37.1 C)  TempSrc:   Oral Oral  SpO2:   97% 98%  Weight: (!) 144.6 kg     Height:        General exam: awake, alert, no acute distress HEENT: wearing glasses, moist mucus membranes, hearing grossly normal  Respiratory system: CTAB, no wheezes, rales or rhonchi, normal respiratory effort. Cardiovascular system: normal S1/S2, RRR, persistent lower extremity edema, scrotal edema is notable improved Gastrointestinal system: distended, non-tender Genitourinary system: scrotal edema is persistent but improving slowly Central nervous system: A&O x 4. no gross focal neurologic deficits, normal speech Extremities: B/L AKA's,  BLE and truncal edema Skin: dry, intact, normal temperature Psychiatry: normal mood, congruent affect, judgement and insight appear normal    Data Reviewed: Notable labs --  K 2.9 >> 3.1 >> 2.9 (given 40 mEq x 4 doses) >> 3.5 (given 40 mEq>> 3.0 Cr stable  WBC 2.2 >> 2.3 >> 2.1 >> 2.2 Hbg 10.6 >> 10.9 >> 10.8 >> 11.0  INR 1.3 >> 1.6 >> 2.1  GI panel negative   Family Communication: None present. Pt is able to update.   Disposition: Status is: Inpatient Remains inpatient appropriate because:  --remains on IV diuretics, and requiring daily potassium supplementation with close lab monitoring.   --Persistent ileus.    --Not yet medically stable for d/c.     Planned Discharge Destination: Home with Home Health    Time spent: 42 minutes  Author: Pennie Banter, DO 03/30/2023 3:13 PM  For on call review [email or web address].

## 2023-03-30 NOTE — Consult Note (Signed)
Patient ID: [name], male   DOB: 1955-11-29, 68 y.o.   MRN: [id number] CC: Abdominal Distsention History of Present Illness [name] is a 68 y.o. 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  He was recently admitted to the hospital because he had a recurrent DVT.  He says since then he has had increasing abdominal distention.  A CT was obtained that did show some dilation of the sigmoid colon.  He says that he has continued to pass gas and is now having just clear diarrhea.  He also reports that his abdomen feels more distended.  He has been able to tolerate a diet and does not report any nausea or vomiting.  His workup included a CT as mentioned as well as an x-ray that was done 2 days ago.  This x-ray did show dilation of his colon particularly in his sigmoid colon.  Past Medical History Past Medical History:  Diagnosis Date   Depression    History of blood clots 08/13/2011   History of left knee replacement    HTN (hypertension)    Lymphedema    lt leg-per pt   PE (pulmonary thromboembolism) (HCC)    Pressure injury of skin, unspecified injury stage, unspecified location    S/P AKA (above knee amputation) bilateral (HCC)    rt 11/24/07 and lt 01/11/17       Past Surgical History:  Procedure Laterality Date   ACHILLES TENDON SURGERY Left    per pt   bone clip removal Left 10/04/2015   knee   CARDIAC SURGERY  11/2012   cath.-per pt   CERVICAL FUSION  07/28/2015   per pt   EPIGASTRIC HERNIA REPAIR     per pt   hematoma removal Left 11/02/2015   knee   HIP SURGERY Right    x8 per pt   IR IMAGING GUIDED PORT INSERTION  11/14/2022   KNEE FUSION Right    KNEE JOINT MANIPULATION Left 09/11/2011   KNEE SURGERY Left    x3   leg stump Left 03/2017   leg surgery-per pt   NECK SURGERY  02/2015   plates-per pt    REPLACEMENT TOTAL KNEE Right    REPLACEMENT TOTAL KNEE Left 07/02/2011   again in 07/10/2011 per pt   s/p of bilateral AKA Right    screen filter removal  10/20/2008   for embolism per pt   screen filter replacement     x3   SHOULDER SURGERY Right    x2-per pt   SPINE SURGERY  02/11/2015   vertebra spacer-per pt   toe nail removal Right    per pt   VARICOSE VEIN SURGERY     x5-per pt    Allergies  Allergen Reactions   Bacitracin Hives   Vancomycin Other (See Comments) and Rash    Red man syndrome.   Ketorolac     Other Reaction(s): Kidney Disorder  Other reaction(s): Kidney Disorder   Spironolactone     Other Reaction(s): Unknown  Other reaction(s): Unknown    Current Facility-Administered Medications  Medication Dose Route Frequency Provider Last Rate Last Admin   albumin human 5 % solution 12.5 g  12.5 g Intravenous BID Esaw Grandchild A, DO 60 mL/hr at 03/30/23 1232 12.5 g at 03/30/23 1232   albuterol (PROVENTIL) (2.5 MG/3ML) 0.083% nebulizer solution 2.5 mg  2.5 mg  Nebulization Q4H PRN Floydene Flock, MD       buPROPion (WELLBUTRIN XL) 24 hr tablet 300 mg  300 mg Oral Daily Agbata, Tochukwu, MD   300 mg at 03/30/23 0946   diazepam (VALIUM) tablet 5 mg  5 mg Oral Q12H PRN Esaw Grandchild A, DO   5 mg at 03/30/23 1223   enoxaparin (LOVENOX) injection 135 mg  1 mg/kg Subcutaneous Q12H Agbata, Tochukwu, MD   135 mg at 03/30/23 0945   furosemide (LASIX) injection 40 mg  40 mg Intravenous Q12H Esaw Grandchild A, DO   40 mg at 03/30/23 1610   gabapentin (NEURONTIN) capsule 600 mg  600 mg Oral TID Esaw Grandchild A, DO   600 mg at 03/30/23 9604   Gerhardt's butt cream   Topical TID Lucile Shutters, MD   Given at 03/29/23 2200   hydrocortisone cream 0.5 %   Topical BID Esaw Grandchild A, DO   Given at 03/30/23 1225   iohexol (OMNIPAQUE) 300 MG/ML solution 30 mL  30 mL Oral Once PRN Agbata, Tochukwu, MD       lactulose (CHRONULAC) 10 GM/15ML solution 20 g  20 g Oral BID  Esaw Grandchild A, DO   20 g at 03/30/23 0946   leptospermum manuka honey (MEDIHONEY) paste 1 Application  1 Application Topical Daily Agbata, Tochukwu, MD   1 Application at 03/29/23 1542   lisinopril (ZESTRIL) tablet 10 mg  10 mg Oral Daily Agbata, Tochukwu, MD   10 mg at 03/30/23 0946   LORazepam (ATIVAN) injection 0.5 mg  0.5 mg Intravenous Once PRN Pennie Banter, DO       ondansetron [facility]) tablet 4 mg  4 mg Oral Q6H PRN Floydene Flock, MD   4 mg at 03/24/23 1559   Or   ondansetron (ZOFRAN) injection 4 mg  4 mg Intravenous Q6H PRN Floydene Flock, MD   4 mg at 03/25/23 0431   oxyCODONE-acetaminophen (PERCOCET/ROXICET) 5-325 MG per tablet 1 tablet  1 tablet Oral Q12H PRN Agbata, Tochukwu, MD   1 tablet at 03/28/23 1805   psyllium (HYDROCIL/METAMUCIL) 1 packet  1 packet Oral Daily Esaw Grandchild A, DO   1 packet at 03/30/23 5409   senna-docusate (Senokot-S) tablet 1 tablet  1 tablet Oral BID Esaw Grandchild A, DO   1 tablet at 03/30/23 8119   sodium chloride flush (NS) 0.9 % injection 3 mL  3 mL Intravenous Q12H Floydene Flock, MD   3 mL at 03/30/23 1000   sodium chloride flush (NS) 0.9 % injection 3 mL  3 mL Intravenous PRN Floydene Flock, MD       traZODone (DESYREL) tablet 50 mg  50 mg Oral QHS PRN Esaw Grandchild A, DO       warfarin (COUMADIN) tablet 7.5 mg  7.5 mg Oral ONCE-1600 Nazari, Walid A, RPH       Warfarin - Pharmacist Dosing Inpatient   Does not apply [id number] Lucile Shutters, MD   Given at 03/28/23 1803    Family History Family History  Problem Relation Age of Onset   Alzheimer's disease Mother    Lung cancer Father        Social History Social History   Tobacco Use   Smoking status: Never   Smokeless tobacco: Former  Building services engineer status: Never Used  Substance Use Topics   Alcohol use: Yes    Alcohol/week: 3.0 standard drinks of alcohol    Types: 3 Glasses of  wine per week   Drug use: Never        ROS Full ROS of systems performed and  is otherwise negative there than what is stated in the HPI  Physical Exam Blood pressure 115/76, pulse 72, temperature 98.7 F (37.1 C), temperature source Oral, resp. rate 16, height 4\' 10"  (1.473 m), weight (!) 144.6 kg, SpO2 98%.  Laying in bed in no acute distress, normal work of breathing on room air, regular rate and rhythm, bilateral above-the-knee amputations, abdomen is distended and tympanic but there are no signs of peritonitis Rectal exam performed and no stool in the rectal vault, no masses or strictures noted to Data Reviewed I reviewed his CT scan and it does look like there is some dilation of the colon particularly in the sigmoid colon.  This is worse on a subsequent KUB that was done 2 days ago.  His labs are notable for a leukopenia of 2.2 and hypokalemia to 3.0.  He is on warfarin so his INR is elevated to 2 point  I have personally reviewed the patient's imaging and medical records.    Assessment/Plan    68 year old male admitted to the hospital with recurrent DVT who now has constipation and distention.  He is having clear watery bowel movements.  On CT scan there is a stool burden and utilize dilated loops of colon on his KUB.  He has been taking pro diarrheal's from above but has not had any suppositories or enemas.  At this time recommend supportive care and giving the patient a soapsuds enema to see if there is any stool burden that needs to be evacuated.  Also recommend correcting his hypokalemia because it can certainly cause a degree of bowel dysfunction.  If his distention does not improve with an enema may need to contact GI for possible decompression      Kandis Cocking 03/30/2023, 2:38 PM

## 2023-03-30 NOTE — Consult Note (Signed)
PHARMACY - ANTICOAGULATION CONSULT NOTE  Pharmacy Consult for warfarin & enoxaparin Indication: DVT  Allergies  Allergen Reactions   Bacitracin Hives   Vancomycin Other (See Comments) and Rash    Red man syndrome.   Ketorolac     Other Reaction(s): Kidney Disorder  Other reaction(s): Kidney Disorder   Spironolactone     Other Reaction(s): Unknown  Other reaction(s): Unknown   Patient Measurements: Height: 4\' 10"  (147.3 cm) Weight: (!) 144.6 kg (318 lb 12.6 oz) IBW/kg (Calculated) : 45.4  Vital Signs: Temp: 98.1 F (36.7 C) (01/25 0405) BP: 111/84 (01/25 0405) Pulse Rate: 70 (01/25 0405)  Labs: Recent Labs    03/28/23 0458 03/29/23 0500 03/30/23 0511  HGB 10.9* 10.8* 11.0*  HCT 33.1* 33.5* 34.2*  PLT 175 170 158  LABPROT 16.8* 19.2* 23.8*  INR 1.3* 1.6* 2.1*  CREATININE 0.70 0.70 0.82   Estimated Creatinine Clearance: 105.2 mL/min (by C-G formula based on SCr of 0.82 mg/dL).  Medical History: Past Medical History:  Diagnosis Date   Depression    History of blood clots 08/13/2011   History of left knee replacement    HTN (hypertension)    Lymphedema    lt leg-per pt   PE (pulmonary thromboembolism) (HCC)    Pressure injury of skin, unspecified injury stage, unspecified location    S/P AKA (above knee amputation) bilateral (HCC)    rt 11/24/07 and lt 01/11/17   Warfarin INR 1.2 on 03/19/2023 after 2 weeks on warfarin 5 mg daily. Per note review - warfarin dose was changed to 7.5 mg three times a week and 5 mg on the other four days.  INR on admission 1.3 (03/23/2023). Therapeutic enoxaparin 1mg /kg q 12hr initiated as well.  Assessment: PMH significant of stage IV prostate cancer, recurrent DVT and PE, lymphedema, hypertension, status post AKA bilaterally, pressure ulcers. Patient presents with recurrent DVT despite eliquis use. INR sub-therapeutic on admission and sub-therapeutic on last outpatient check as well. Pharmacy has been consulted to manage this  patient's warfarin.   Goal of Therapy:  INR 2-3 Monitor platelets by anticoagulation protocol: Yes  Warfarin:  Date INR Clinical Assessment Warfarin Dose   1/18 1.3 SUBtherapeutic ---  1/19 ---  7.5 mg  1/20 1.3 SUBtherapeutic 10 mg   1/21 1.3  SUBtherapeutic 10 mg  1/22 1.3 SUBtherapeutic 15 mg   1/23 1.3 SUBtherapeutic 15 mg  1/24 1.6 SUBtherapeutic 15 mg  1/25 2.1 Therapeutic x 1    Plan:  INR increased to 2.1 today  Will order home dose of 7.5mg  x 1 today as INR is therapeutic Continue enoxaparin 1mg /kg Q12H for a minimum of 5 days and until INR > 2 for 24 hours No drug-drug interactions with current regimen  Daily INR for now CBC stable; monitor at least every 72 hours   Bettey Costa, PharmD Clinical Pharmacist 03/30/2023 8:51 AM

## 2023-03-30 NOTE — TOC Initial Note (Signed)
Transition of Care [facility]) - Initial/Assessment Note    Patient Details  Name: [name] MRN: [id number] Date of Birth: 10-07-1955  Transition of Care [facility]) CM/SW Contact:    Rodney Langton, RN Phone Number: 03/30/2023, 3:32 PM  Clinical Narrative:                  Patient admitted from home, Dr. Zada Finders is PCP.  Has DME in the home, including hospital bed.  Patient is active with [facility] and would like to continue to use their services.   Expected Discharge Plan: Home w Home Health Services Barriers to Discharge: Continued Medical Work up   Patient Goals and CMS Choice Patient states their goals for this hospitalization and ongoing recovery are:: Home with home health          Expected Discharge Plan and Services     Post Acute Care Choice: Home Health Living arrangements for the past 2 months: Single Family Home                                      Prior Living Arrangements/Services Living arrangements for the past 2 months: Single Family Home                Current home services: DME, Home OT, Home PT    Activities of Daily Living   ADL Screening (condition at time of admission) Independently performs ADLs?: No Does the patient have a NEW difficulty with bathing/dressing/toileting/self-feeding that is expected to last >3 days?: No Does the patient have a NEW difficulty with getting in/out of bed, walking, or climbing stairs that is expected to last >3 days?: No Does the patient have a NEW difficulty with communication that is expected to last >3 days?: No Is the patient deaf or have difficulty hearing?: No Does the patient have difficulty seeing, even when wearing glasses/contacts?: No Does the patient have difficulty concentrating, remembering, or making decisions?: No  Permission Sought/Granted                  Emotional Assessment              Admission diagnosis:  Acute DVT (deep venous thrombosis) (HCC)  [I82.409] Acute deep vein thrombosis (DVT) of femoral vein of right lower extremity (HCC) [I82.411] Patient Active Problem List   Diagnosis Date Noted   Ileus (HCC) 03/30/2023   Acute DVT (deep venous thrombosis) (HCC) 03/23/2023   Shortness of breath 03/23/2023   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 03/23/2023   Hypokalemia 03/09/2023   Essential hypertension 03/08/2023   Peripheral neuropathy 03/08/2023   DVT, lower extremity, recurrent (HCC) 03/08/2023   Leg DVT (deep venous thromboembolism), acute, left (HCC) 03/07/2023   Cellulitis of right leg 03/07/2023   Genetic testing 01/09/2023   Morbid obesity with BMI of 60.0-69.9, adult (HCC) 10/01/2022   Prostate cancer metastatic to bone (HCC) 09/30/2022   Back pain 09/30/2022   Obesity 09/30/2022   Diarrhea 09/30/2022   Pain from bone metastases (HCC) 09/30/2022   S/P AKA (above knee amputation) bilateral (HCC)    PE (pulmonary thromboembolism) (HCC)    Depression    PCP:  Dione Housekeeper, MD Pharmacy:   [facility] [address] [place] - [address] [place] [address] Phone: [phone] Fax: [phone]  [facility] [place], [place] - [address]  [address] [employer] [address] Phone: [phone] Fax: [phone]     Social Drivers of Health (SDOH) Social History: SDOH Screenings   Food Insecurity: No Food Insecurity (03/25/2023)  Housing: Low Risk  (03/25/2023)  Transportation Needs: No Transportation Needs (03/25/2023)  Utilities: Not At Risk (03/25/2023)  Financial Resource Strain: High Risk (07/28/2021)   Received from [facility], [facility]  Physical Activity: Sufficiently Active (07/28/2021)   Received from [facility], [facility]  Social Connections: Moderately Integrated (03/25/2023)  Stress: Stress Concern Present (08/01/2021)   Received from  [facility], [facility]  Tobacco Use: Medium Risk (03/25/2023)   SDOH Interventions:     Readmission Risk Interventions    03/25/2023   10:53 AM  Readmission Risk Prevention Plan  Transportation Screening Complete  PCP or Specialist Appt within 5-7 Days Complete  Home Care Screening Complete  Medication Review (RN CM) Complete

## 2023-03-30 NOTE — Plan of Care (Signed)
  Problem: Clinical Measurements: Goal: Ability to maintain clinical measurements within normal limits will improve Outcome: Progressing Goal: Will remain free from infection Outcome: Progressing Goal: Respiratory complications will improve Outcome: Progressing Goal: Cardiovascular complication will be avoided Outcome: Progressing   Problem: Pain Managment: Goal: General experience of comfort will improve and/or be controlled Outcome: Progressing   Problem: Activity: Goal: Risk for activity intolerance will decrease Outcome: Not Progressing   Problem: Elimination: Goal: Will not experience complications related to bowel motility Outcome: Not Progressing   Problem: Skin Integrity: Goal: Risk for impaired skin integrity will decrease Outcome: Not Progressing

## 2023-03-30 NOTE — Plan of Care (Signed)

## 2023-03-31 DIAGNOSIS — C61 Malignant neoplasm of prostate: Secondary | ICD-10-CM | POA: Diagnosis not present

## 2023-03-31 DIAGNOSIS — I82403 Acute embolism and thrombosis of unspecified deep veins of lower extremity, bilateral: Secondary | ICD-10-CM | POA: Diagnosis not present

## 2023-03-31 DIAGNOSIS — C7951 Secondary malignant neoplasm of bone: Secondary | ICD-10-CM | POA: Diagnosis not present

## 2023-03-31 DIAGNOSIS — K567 Ileus, unspecified: Secondary | ICD-10-CM | POA: Diagnosis not present

## 2023-03-31 LAB — BASIC METABOLIC PANEL
Anion gap: 11 (ref 5–15)
BUN: 12 mg/dL (ref 8–23)
CO2: 30 mmol/L (ref 22–32)
Calcium: 9.3 mg/dL (ref 8.9–10.3)
Chloride: 98 mmol/L (ref 98–111)
Creatinine, Ser: 0.8 mg/dL (ref 0.61–1.24)
GFR, Estimated: >60 mL/min (ref >60)
Glucose, Bld: 100 mg/dL — ABNORMAL HIGH (ref 70–99)
Potassium: 3.4 mmol/L — ABNORMAL LOW (ref 3.5–5.1)
Sodium: 139 mmol/L (ref 135–145)

## 2023-03-31 LAB — GLUCOSE, CAPILLARY: Glucose-Capillary: 116 mg/dL — ABNORMAL HIGH (ref 70–99)

## 2023-03-31 LAB — PROTIME-INR
INR: 2.1 — ABNORMAL HIGH (ref 0.8–1.2)
Prothrombin Time: 23.6 s — ABNORMAL HIGH (ref 11.4–15.2)

## 2023-03-31 MED ORDER — POTASSIUM CHLORIDE CRYS ER 20 MEQ PO TBCR
40.0000 meq | EXTENDED_RELEASE_TABLET | Freq: Once | ORAL | Status: AC
Start: 2023-03-31 — End: 2023-03-31
  Administered 2023-03-31: 40 meq via ORAL
  Filled 2023-03-31: qty 2

## 2023-03-31 MED ORDER — CHLORHEXIDINE GLUCONATE CLOTH 2 % EX PADS
6.0000 | MEDICATED_PAD | Freq: Every day | CUTANEOUS | Status: DC
  Administered 2023-03-31 – 2023-04-05 (×6): 6 via TOPICAL

## 2023-03-31 MED ORDER — LIDOCAINE-PRILOCAINE 2.5-2.5 % EX CREA
TOPICAL_CREAM | Freq: Once | CUTANEOUS | Status: AC
  Filled 2023-03-31: qty 5

## 2023-03-31 MED ORDER — WARFARIN SODIUM 7.5 MG PO TABS
7.5000 mg | ORAL_TABLET | Freq: Once | ORAL | Status: AC
  Administered 2023-03-31: 7.5 mg via ORAL
  Filled 2023-03-31: qty 1

## 2023-03-31 NOTE — Progress Notes (Signed)
Albumin not given. Patient has no IV access. Order put in by B. Jon Billings NP to access his right chest wall power port. IV team here, however has not been able to see this patient. Will inform Velva Harman, AM nurse that albumin needs to be reordered.

## 2023-03-31 NOTE — Plan of Care (Signed)

## 2023-03-31 NOTE — Progress Notes (Signed)
Physical Therapy Treatment Patient Details Name: [name] MRN: [id number] DOB: 25-Feb-1956 Today's Date: 03/31/2023   History of Present Illness Pt is a 68 y.o. male presenting to hospital 03/22/23 with c/o whole body feeling swollen.  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  PMH includes htn, recurrent DVT and PE, recent acute L leg DVT, prostate CA, cellulitis R leg, lymphedema, B AKA, pressure ulcers, R hip sx, neck sx, R shoulder sx, spine sx.    PT Comments  Pt able to sit upright in bed with no support for session.  Worked on scooting left/right/backwards/forwards in sitting position.  Pt overall moves very good.  Declined OOB to chair as equipment is not similar to home.  Given mobility quality in sitting anticipate pt would do well with transfer with his own set up.  Discussed increasing mobility on the unit by sitting unsupported in bed several times a day and practicing scooting as we did in session. Pt voices understanding.   If plan is discharge home, recommend the following: A little help with walking and/or transfers;A little help with bathing/dressing/bathroom;Assist for transportation   Can travel by private vehicle        Equipment Recommendations       Recommendations for Other Services       Precautions / Restrictions Precautions Precautions: Fall Precaution Comments: B AKA Restrictions Weight Bearing Restrictions Per Provider Order: No     Mobility  Bed Mobility Overal bed mobility: Modified Independent Bed Mobility: Supine to Sit, Sit to Supine Rolling: Used rails   Supine to sit: Used rails Sit to supine: Used rails   General bed mobility comments: able to transition with use of rails and laterally scoot left/right/front/back without assist    Transfers                        Ambulation/Gait                   Stairs             Wheelchair Mobility     Tilt  Bed    Modified Rankin (Stroke Patients Only)       Balance Overall balance assessment: Needs assistance Sitting-balance support: No upper extremity supported, Feet unsupported Sitting balance-Leahy Scale: Good Sitting balance - Comments: steady in bed with dynamic scooting left/right                                    Cognition Arousal: Alert Behavior During Therapy: WFL for tasks assessed/performed Overall Cognitive Status: Within Functional Limits for tasks assessed                                 General Comments: very talkative        Exercises      General Comments        Pertinent Vitals/Pain Pain Assessment Pain Assessment: No/denies pain    Home Living                          Prior Function            PT Goals (current goals can now be found in the care plan section) Progress towards PT goals: Progressing toward goals  Frequency    Min 1X/week      PT Plan      Co-evaluation              AM-PAC PT "6 Clicks" Mobility   Outcome Measure  Help needed turning from your back to your side while in a flat bed without using bedrails?: None Help needed moving from lying on your back to sitting on the side of a flat bed without using bedrails?: None Help needed moving to and from a bed to a chair (including a wheelchair)?: A Little Help needed standing up from a chair using your arms (e.g., wheelchair or bedside chair)?: Total Help needed to walk in hospital room?: Total Help needed climbing 3-5 steps with a railing? : Total 6 Click Score: 14    End of Session   Activity Tolerance: Patient tolerated treatment well Patient left: in bed;with call bell/phone within reach;with bed alarm set Nurse Communication: Mobility status PT Visit Diagnosis: Other abnormalities of gait and mobility (R26.89)     Time: 1343-1405 PT Time Calculation (min) (ACUTE ONLY): 22 min  Charges:    $Therapeutic  Activity: 8-22 mins PT General Charges $$ ACUTE PT VISIT: 1 Visit                   Danielle Dess, PTA 03/31/23, 2:20 PM

## 2023-03-31 NOTE — Consult Note (Signed)
PHARMACY - ANTICOAGULATION CONSULT NOTE  Pharmacy Consult for warfarin & enoxaparin Indication: DVT  Allergies  Allergen Reactions   Bacitracin Hives   Vancomycin Other (See Comments) and Rash    Red man syndrome.   Ketorolac     Other Reaction(s): Kidney Disorder  Other reaction(s): Kidney Disorder   Spironolactone     Other Reaction(s): Unknown  Other reaction(s): Unknown   Patient Measurements: Height: 4\' 10"  (147.3 cm) Weight: (!) 141.5 kg (311 lb 15.2 oz) IBW/kg (Calculated) : 45.4  Vital Signs: Temp: 97.8 F (36.6 C) (01/26 0450) Temp Source: Oral (01/25 2225) BP: 126/75 (01/26 0450) Pulse Rate: 59 (01/26 0450)  Labs: Recent Labs    03/29/23 0500 03/30/23 0511 03/31/23 0502  HGB 10.8* 11.0*  --   HCT 33.5* 34.2*  --   PLT 170 158  --   LABPROT 19.2* 23.8* 23.6*  INR 1.6* 2.1* 2.1*  CREATININE 0.70 0.82 0.80   Estimated Creatinine Clearance: 106.2 mL/min (by C-G formula based on SCr of 0.8 mg/dL).  Medical History: Past Medical History:  Diagnosis Date   Depression    History of blood clots 08/13/2011   History of left knee replacement    HTN (hypertension)    Lymphedema    lt leg-per pt   PE (pulmonary thromboembolism) (HCC)    Pressure injury of skin, unspecified injury stage, unspecified location    S/P AKA (above knee amputation) bilateral (HCC)    rt 11/24/07 and lt 01/11/17   Warfarin INR 1.2 on 03/19/2023 after 2 weeks on warfarin 5 mg daily. Per note review - warfarin dose was changed to 7.5 mg three times a week and 5 mg on the other four days.  INR on admission 1.3 (03/23/2023). Therapeutic enoxaparin 1mg /kg q 12hr initiated as well.  Assessment: PMH significant of stage IV prostate cancer, recurrent DVT and PE, lymphedema, hypertension, status post AKA bilaterally, pressure ulcers. Patient presents with recurrent DVT despite eliquis use. INR sub-therapeutic on admission and sub-therapeutic on last outpatient check as well. Pharmacy has  been consulted to manage this patient's warfarin.   Goal of Therapy:  INR 2-3 Monitor platelets by anticoagulation protocol: Yes  Warfarin:  Date INR Clinical Assessment Warfarin Dose   1/18 1.3 SUBtherapeutic ---  1/19 ---  7.5 mg  1/20 1.3 SUBtherapeutic 10 mg   1/21 1.3  SUBtherapeutic 10 mg  1/22 1.3 SUBtherapeutic 15 mg   1/23 1.3 SUBtherapeutic 15 mg  1/24 1.6 SUBtherapeutic 15 mg  1/25 2.1 Therapeutic x 1   1/26 2.1 Therapeutic x 2    Plan:  Will order 7.5mg  x 1 again today as INR did not improve from yesterday but still in therapeutic range Will need to try and confirm which days patient takes his 7.5mg  vs 5mg  dose Discontinuing enoxaparin bridge as INR has been therapeutic for consecutive days No drug-drug interactions with current regimen  Daily INR for now CBC stable; monitor at least every 72 hours   Bettey Costa, PharmD Clinical Pharmacist 03/31/2023 9:26 AM

## 2023-03-31 NOTE — Plan of Care (Signed)
  Problem: Education: Goal: Knowledge of General Education information will improve Description: Including pain rating scale, medication(s)/side effects and non-pharmacologic comfort measures Outcome: Progressing   Problem: Health Behavior/Discharge Planning: Goal: Ability to manage health-related needs will improve Outcome: Progressing   Problem: Clinical Measurements: Goal: Ability to maintain clinical measurements within normal limits will improve Outcome: Progressing Goal: Will remain free from infection Outcome: Progressing Goal: Diagnostic test results will improve Outcome: Progressing Goal: Respiratory complications will improve Outcome: Progressing Goal: Cardiovascular complication will be avoided Outcome: Progressing   Problem: Nutrition: Goal: Adequate nutrition will be maintained Outcome: Progressing   Problem: Elimination: Goal: Will not experience complications related to bowel motility Outcome: Progressing Goal: Will not experience complications related to urinary retention Outcome: Progressing

## 2023-03-31 NOTE — Progress Notes (Signed)
CC: Constipation  Subjective: Patient received enema yesterday and had a large amount of brown, liquid stool.  He reports that his distention is somewhat improved.  He was able to tolerate a regular diet without any nausea or vomiting.  He has not been out of bed.  Objective: Vital signs in last 24 hours: Temp:  [97.8 F (36.6 C)-98.7 F (37.1 C)] 98.5 F (36.9 C) (01/26 1029) Pulse Rate:  [59-72] 62 (01/26 1029) Resp:  [16-18] 18 (01/26 1029) BP: (111-126)/(64-76) 111/64 (01/26 1029) SpO2:  [98 %-100 %] 100 % (01/26 1029) Weight:  [141.5 kg] 141.5 kg (01/26 0500) Last BM Date : 03/30/23  Intake/Output from previous day: 01/25 0701 - 01/26 0700 In: 360 [P.O.:360] Out: 3251 [Urine:2650; Stool:601] Intake/Output this shift: Total I/O In: 240 [P.O.:240] Out: -   Physical exam:  Abdomen is soft, nontender but still distended with some tympany.  Lab Results: CBC  Recent Labs    03/29/23 0500 03/30/23 0511  WBC 2.1* 2.2*  HGB 10.8* 11.0*  HCT 33.5* 34.2*  PLT 170 158   BMET Recent Labs    03/30/23 0511 03/31/23 0502  NA 139 139  K 3.0* 3.4*  CL 99 98  CO2 30 30  GLUCOSE 97 100*  BUN 10 12  CREATININE 0.82 0.80  CALCIUM 9.1 9.3   PT/INR Recent Labs    03/30/23 0511 03/31/23 0502  LABPROT 23.8* 23.6*  INR 2.1* 2.1*   ABG No results for input(s): "PHART", "HCO3" in the last 72 hours.  Invalid input(s): "PCO2", "PO2"  Studies/Results: DG Abd 1 View Result Date: 03/30/2023 CLINICAL DATA:  Abdominal distension EXAM: ABDOMEN - 1 VIEW COMPARISON:  03/28/2023 FINDINGS: Scattered large and small bowel gas is noted. The degree of sigmoid gaseous dilatation is relatively stable. No obstructive changes are seen. No free air is noted. Mild retained fecal material is noted in the right colon. This is stable from the prior exam. IMPRESSION: Stable appearance when compare with the prior exam. Electronically Signed   By: Alcide Clever M.D.   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  He received an enema yesterday and has had a a large amount of liquid stool out.  He is tolerating a regular diet.  Recommend continued aggressive bowel regimen as needed.  He needs to mobilize as this will help his gut function.  Recommend PT and OT consult to get him moving.Clinical signs of obstruction and no surgical intervention at this time.  Surgery will sign off please contact us with any questions or concerns  Baker Pierini, M.D. [name]

## 2023-03-31 NOTE — Progress Notes (Signed)
Progress Note   Patient: [name] XBJ:[id number] DOB: 1955-09-16 DOA: 03/23/2023     8 DOS: the patient was seen and examined on 03/31/2023   Brief hospital course:  [name] is a 68 y.o. male with medical history significant of stage IV prostate cancer, recurrent DVT and PE, lymphedema, hypertension, status post AKA bilaterally, pressure ulcers presenting with recurrent DVT.  Patient noted to have been admitted January 2 January 6 for acute left-sided DVT.  Had DVT recurrence despite Eliquis use.  Was transition from heparin to Lovenox at discharge.  Patient states he was unable to afford it and was subsequently transition to Coumadin by PCP.  Coumadin Level has been subtherapeutic per report.  Has had worsening shortness of breath lower extremity swelling and pain x 2 to 3 days.  Noted baseline bilateral AKA.  Mild orthopnea.  No chest pain.  No fevers or chills.  Baseline stage IV prostate cancer.  Followed by Dr. Donneta Romberg.  Non-smoker. Presented to the ER afebrile, hemodynamically stable.  Satting well on room air.  White count 4.4, hemoglobin 10.4, platelets 230, INR 1.3, COVID flu and RSV negative.  Creatinine 0.87.  BNP 140.  Chest x-ray with cardiomegaly.  Lower extremity ultrasound with occlusive thrombus in the proximal segment of right femoral vein.  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 Review of Systems: As mentioned in the history of present illness. All other systems reviewed and are negative.     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 High thromboembolism risk in the setting of underlying active malignancy Noted recent for similar issues January 2 to January 6 and discharged on Lovenox Was unable to afford Lovenox and transitioned to  Coumadin in outpt setting without the bridge INR remains subtherapeutic at 1.3 Chest CT is negative for PE --Continue Lovenox bridge >> Coumadin dosing per pharmacy  --Daily INR's (1.3 >> 1.6 today after several days 1.3)     Hypokalemia Hypomagnesemia Severe 1/24: improved K 2.9 >> 3.5 after four doses 40 mEq given yesterday 1/25: K 3.0 -- 40 mEq x 2 doses ordered Secondary to diuretic therapy as well as GI losses from diarrhea --Supplement potassium and magnesium PRN     Acute on chronic diastolic CHF Patient with complaints of fluid overload, excessive weight gain in both thighs and scrotum Progressive shortness of breath and generalized swelling in the setting of active malignancy and recurrent DVT Positive cardiomegaly on chest x-ray BNP 140 2D echo shows an LVEF of 55 - 60%. Normal RV size and function Concern for possible Cor Pulmonale related to recurrent VTE, but CTA chest on admission was negative for pulmonary emboli, could have tiny clots not detected on CT.  He continues to have progressive peripheral edema. 1/24 - renal function stable and edema is persistent but improving. --Continue IV Lasix 40 mg BID  --2 doses IV albumin ordered to precede Lasix today --Resumed Lisinopril at 10mg  daily --Strict Io's, daily weights --Monitor renal function, electrolytes    Colonic Ileus -  Diarrhea - mucus liquid stools Abdominal pain - post bowel movements, suspect due to retained stool in colon CT abd/pelvis w/o contrast on 1/21 - no acute findings but showed moderate stool burden in the colon.  Suspect diarrhea is 'overflow' stool passing around retained solid stool. GI pathogen panel is negative.  Very low suspicion C diff given low frequency of episodes, no leukocytosis, no fevers 1/23: Repeat xray abdomen today shows persistent stool burden and colonic distention (?ileus) 1/25: persistent ileus.  Soap suds enema given with multiple BM's after 1/26: still  distended --Consult general surgery for input - ordered enema, given 1/25.  Signed off. --Avoid anti-diarrheals --Continue lactulose 20 g BID, Senna-S BID --Encourage PO hydration --No N/V - diet is okay to continue --Portable KUB's periodically to monitor    Prostate cancer metastatic to bone (HCC) Baseline stage 4 metastatic prostate with bone involvement  --Followed by Dr. 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   --Recommends to follow-up with CT surgery as an outpatient    Contact dermatitis - at sites of adhesives (cardiac leads, prior IV site) --Hydrocortisone cream PRN    Stage III pressure injury Involving the ischial tuberosity I agree with the wound description/s as outlined. --Continue diligent wound care, frequent off-loading, monitor for s/sx's of infection  Pressure Injury 03/23/23 Ischial tuberosity Left Stage 3 -  Full thickness tissue loss. Subcutaneous fat may be visible but bone, tendon or muscle are NOT exposed. 1 cm x 1 cm x 0.1 cm 50% pink moist 50% yellow (Active)  03/23/23   Location: Ischial tuberosity  Location Orientation: Left  Staging: Stage 3 -  Full thickness tissue loss. Subcutaneous fat may be visible but bone, tendon or muscle are NOT exposed.  Wound Description (Comments): 1 cm x 1 cm x 0.1 cm 50% pink moist 50% yellow  Present on Admission: Yes     Pressure Injury 03/26/23 Buttocks  Left;Lateral Stage 2 -  Partial thickness loss of dermis presenting as a shallow open injury with a red, pink wound bed without slough. (Active)  03/26/23 0000  Location: Buttocks  Location Orientation: Left;Lateral  Staging: Stage 2 -  Partial thickness loss of dermis presenting as a shallow open injury with a red, pink wound bed without slough.  Wound Description (Comments):   Present on Admission: Yes           Subjective: Pt had multiple brown BM's after enema yesterday.  States he was taken down to radiology after the enema was given, before having BM's, but made it without a mess.  He notes not having significantly increased urine output with albumin and lasix yesterday, that seems slowing down.   Physical Exam: Vitals:   03/30/23 2225 03/31/23 0450 03/31/23 0500 03/31/23 1029  BP: 116/74 126/75  111/64  Pulse: 65 (!) 59  62  Resp:  18  18  Temp: 98.3 F (36.8 C) 97.8 F (36.6 C)  98.5 F (36.9 C)  TempSrc: Oral     SpO2: 98% 98%  100%  Weight:   (!) 141.5 kg   Height:        General exam: awake, alert, no acute distress HEENT: wearing glasses, moist mucus membranes, hearing grossly normal  Respiratory system: on room air, normal respiratory effort. Cardiovascular system: RRR, improved dependent  edema Gastrointestinal system: distended but very slightly softer today, non-tender Genitourinary system: scrotal edema steadily improving Central nervous system: A&O x 4. no gross focal neurologic deficits, normal speech Extremities: B/L AKA's, BLE and truncal edema improving Skin: patches of contact dermatitis noted at prior IV site & cardiac lead sites Psychiatry: normal mood, congruent affect, judgement and insight appear normal    Data Reviewed: Notable labs --  K 3.0 >> 3.4 Cr stable  Last CBC 1/25 --  WBC 2.2 >> 2.3 >> 2.1 >> 2.2 Hbg 10.6 >> 10.9 >> 10.8 >> 11.0  INR 1.3 >> 1.6 >> 2.1 >> 2.1  GI panel negative   Family Communication: None present. Pt is  able to update.   Disposition: Status is: Inpatient Remains inpatient appropriate because:  --remains on IV diuretics --persistent ileus --Not yet medically stable for d/c.     Planned Discharge Destination: Home with Home Health    Time spent: 42 minutes  Author: Pennie Banter, DO 03/31/2023 1:57 PM  For on call review [email or web address].

## 2023-04-01 ENCOUNTER — Telehealth (HOSPITAL_COMMUNITY): Payer: Self-pay | Admitting: Pharmacy Technician

## 2023-04-01 ENCOUNTER — Other Ambulatory Visit (HOSPITAL_COMMUNITY): Payer: Self-pay

## 2023-04-01 DIAGNOSIS — I82403 Acute embolism and thrombosis of unspecified deep veins of lower extremity, bilateral: Secondary | ICD-10-CM | POA: Diagnosis not present

## 2023-04-01 DIAGNOSIS — K567 Ileus, unspecified: Secondary | ICD-10-CM | POA: Diagnosis not present

## 2023-04-01 DIAGNOSIS — C61 Malignant neoplasm of prostate: Secondary | ICD-10-CM | POA: Diagnosis not present

## 2023-04-01 DIAGNOSIS — C7951 Secondary malignant neoplasm of bone: Secondary | ICD-10-CM | POA: Diagnosis not present

## 2023-04-01 LAB — BASIC METABOLIC PANEL
Anion gap: 9 (ref 5–15)
BUN: 13 mg/dL (ref 8–23)
CO2: 30 mmol/L (ref 22–32)
Calcium: 9.5 mg/dL (ref 8.9–10.3)
Chloride: 99 mmol/L (ref 98–111)
Creatinine, Ser: 0.8 mg/dL (ref 0.61–1.24)
GFR, Estimated: >60 mL/min (ref >60)
Glucose, Bld: 93 mg/dL (ref 70–99)
Potassium: 3.1 mmol/L — ABNORMAL LOW (ref 3.5–5.1)
Sodium: 138 mmol/L (ref 135–145)

## 2023-04-01 LAB — PROTIME-INR
INR: 1.5 — ABNORMAL HIGH (ref 0.8–1.2)
Prothrombin Time: 18.7 s — ABNORMAL HIGH (ref 11.4–15.2)

## 2023-04-01 MED ORDER — POTASSIUM CHLORIDE 20 MEQ PO PACK
40.0000 meq | PACK | Freq: Every day | ORAL | Status: DC
  Filled 2023-04-01: qty 2

## 2023-04-01 MED ORDER — SMOG ENEMA
960.0000 mL | Freq: Once | RECTAL | Status: AC
  Administered 2023-04-01 (×2): 960 mL via RECTAL
  Filled 2023-04-01: qty 240

## 2023-04-01 MED ORDER — PEG 3350-KCL-NA BICARB-NACL 420 G PO SOLR
4000.0000 mL | Freq: Once | ORAL | Status: AC
  Administered 2023-04-01: 4000 mL via ORAL
  Filled 2023-04-01: qty 4000

## 2023-04-01 MED ORDER — WARFARIN SODIUM 7.5 MG PO TABS
15.0000 mg | ORAL_TABLET | Freq: Once | ORAL | Status: AC
  Administered 2023-04-01: 15 mg via ORAL
  Filled 2023-04-01 (×2): qty 2

## 2023-04-01 MED ORDER — HYDROCORTISONE 0.5 % EX CREA: TOPICAL_CREAM | Freq: Two times a day (BID) | CUTANEOUS | Status: DC | PRN

## 2023-04-01 MED ORDER — ENOXAPARIN SODIUM 150 MG/ML IJ SOSY
1.0000 mg/kg | PREFILLED_SYRINGE | Freq: Two times a day (BID) | INTRAMUSCULAR | Status: DC
  Administered 2023-04-01 – 2023-04-02 (×3): 135 mg via SUBCUTANEOUS
  Filled 2023-04-01 (×4): qty 0.9

## 2023-04-01 MED ORDER — POTASSIUM CHLORIDE CRYS ER 20 MEQ PO TBCR
40.0000 meq | EXTENDED_RELEASE_TABLET | Freq: Two times a day (BID) | ORAL | Status: AC
  Administered 2023-04-01 (×2): 40 meq via ORAL
  Filled 2023-04-01 (×2): qty 2

## 2023-04-01 NOTE — Progress Notes (Signed)
Progress Note   Patient: [name] ZOX:[id number] DOB: [birth year] DOA: 03/23/2023     7 DOS: the patient was seen and examined on 04/01/2023   Brief hospital course:  Arlander Gillen is a 68 y.o. male with medical history significant of stage IV prostate cancer, recurrent DVT and PE, lymphedema, hypertension, status post AKA bilaterally, pressure ulcers presenting with recurrent DVT.  Patient noted to have been admitted January 2 January 6 for acute left-sided DVT.  Had DVT recurrence despite Eliquis use.  Was transition from heparin to Lovenox at discharge.  Patient states he was unable to afford it and was subsequently transition to Coumadin by PCP.  Coumadin Level has been subtherapeutic per report.  Has had worsening shortness of breath lower extremity swelling and pain x 2 to 3 days.  Noted baseline bilateral AKA.  Mild orthopnea.  No chest pain.  No fevers or chills.  Baseline stage IV prostate cancer.  Followed by Dr. Donneta Romberg.  Non-smoker. Presented to the ER afebrile, hemodynamically stable.  Satting well on room air.  White count 4.4, hemoglobin 10.4, platelets 230, INR 1.3, COVID flu and RSV negative.  Creatinine 0.87.  BNP 140.  Chest x-ray with cardiomegaly.  Lower extremity ultrasound with occlusive thrombus in the proximal segment of right femoral vein.  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 Review of Systems: As mentioned in the history of present illness. All other systems reviewed and are negative.     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 High thromboembolism risk in the setting of underlying active malignancy Noted recent for similar issues January 2 to January 6 and discharged on Lovenox Was unable to afford Lovenox and transitioned to  Coumadin in outpt setting without the bridge INR remains subtherapeutic at 1.3 Chest CT is negative for PE --Continue Lovenox bridge >> Coumadin dosing per pharmacy  --Daily INR's (1.3 >> 1.6 today after several days 1.3)     Hypokalemia Hypomagnesemia Severe 1/24: improved K 2.9 >> 3.5 after four doses 40 mEq given yesterday 1/25: K 3.0 -- 40 mEq x 2 doses ordered Secondary to diuretic therapy as well as GI losses from diarrhea --Supplement potassium and magnesium PRN     Acute on chronic diastolic CHF Patient with complaints of fluid overload, excessive weight gain in both thighs and scrotum Progressive shortness of breath and generalized swelling in the setting of active malignancy and recurrent DVT Positive cardiomegaly on chest x-ray BNP 140 2D echo shows an LVEF of 55 - 60%. Normal RV size and function Concern for possible Cor Pulmonale related to recurrent VTE, but CTA chest on admission was negative for pulmonary emboli, could have tiny clots not detected on CT.  He continues to have progressive peripheral edema. 1/24 - renal function stable and edema is persistent but improving. --Continue IV Lasix 40 mg BID  --Not much different effect with albumin preceding lasix when it was tried --Resumed Lisinopril at 10mg  daily --Strict Io's, daily weights --Monitor renal function, electrolytes    Colonic Ileus -  Diarrhea - mucus liquid stools Abdominal pain - post bowel movements, suspect due to retained stool in colon CT abd/pelvis w/o contrast on 1/21 - no acute findings but showed moderate stool burden in the colon.  Suspect diarrhea is 'overflow' stool passing around retained solid stool. GI pathogen  panel is negative. Very low suspicion C diff given low frequency of episodes, no leukocytosis, no fevers 1/23: Repeat xray abdomen today shows persistent stool burden and colonic distention (?ileus) 1/25: persistent ileus.  Soap suds enema given with multiple BM's  after 1/26--27: still distended --Consult general surgery for input - ordered enema, given 1/25.  Signed off. --GoLytely prep and SMOG enema ordered for today --Avoid anti-diarrheals --Continue lactulose 20 g BID, Senna-S BID --Encourage PO hydration --No N/V - diet is okay to continue --Portable KUB's periodically to monitor     Prostate cancer metastatic to bone (HCC) Baseline stage 4 metastatic prostate with bone involvement  --Followed by Dr. 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   --Recommends to follow-up with CT surgery as an outpatient    Contact dermatitis - at sites of adhesives (cardiac leads, prior IV site) --Hydrocortisone cream PRN    Stage III pressure injury Involving the ischial tuberosity I agree with the wound description/s as outlined. --Continue diligent wound care, frequent off-loading, monitor for s/sx's of infection  Pressure Injury 03/23/23 Ischial tuberosity Left Stage 3 -  Full thickness tissue loss. Subcutaneous fat may be visible but bone, tendon or muscle are NOT exposed. 1 cm x 1 cm x 0.1 cm 50% pink moist 50% yellow (Active)  03/23/23   Location: Ischial tuberosity  Location Orientation: Left  Staging: Stage 3 -  Full thickness tissue loss. Subcutaneous fat may be visible but bone, tendon or muscle are NOT exposed.  Wound Description (Comments): 1 cm x 1 cm x 0.1 cm 50% pink moist 50%  yellow  Present on Admission: Yes     Pressure Injury 03/26/23 Buttocks Left;Lateral Stage 2 -  Partial thickness loss of dermis presenting as a shallow open injury with a red, pink wound bed without slough. (Active)  03/26/23 0000  Location: Buttocks  Location Orientation: Left;Lateral  Staging: Stage 2 -  Partial thickness loss of dermis presenting as a shallow open injury with a red, pink wound bed without slough.  Wound Description (Comments):   Present on Admission: Yes           Subjective: Pt had more clear mucus liquid BM's overnight, no more brown stool output since effect of enema wore off.  He is eager to get all the way cleaned out, abdomen remains distended.  Otherwise swelling is improved, he now is able to move legs around much better than prior to increasing diuresis.   Physical Exam: Vitals:   03/31/23 2053 04/01/23 0100 04/01/23 0500 04/01/23 0834  BP: 106/70 110/72  122/71  Pulse: 62 68  (!) 59  Resp: 18 18  18   Temp: 98.5 F (36.9 C) 98.1 F (36.7 C)  97.7 F (36.5 C)  TempSrc: Oral Oral  Oral  SpO2: 98% 100%  99%  Weight:   (!) 140.6 kg   Height:        General exam: awake, alert, no acute distress HEENT: wearing glasses, moist mucus membranes,  hearing grossly normal  Respiratory system: on room air, normal respiratory effort. Cardiovascular system: RRR, improved dependent edema Gastrointestinal system: distended but very slightly softer today, non-tender Central nervous system: A&O x 4. no gross focal neurologic deficits, normal speech Extremities: B/L AKA's, BLE and truncal edema improving Skin: patches of contact dermatitis noted at prior IV site & cardiac lead sites Psychiatry: normal mood, congruent affect, judgement and insight appear normal    Data Reviewed: Notable labs --   K 3.0 >> 3.4 >> 3.1 Cr stable 0.80  INR 1.3 >> 1.6 >> 2.1 >> 2.1 >> 1.5  Last CBC 1/25 --  WBC 2.2 >> 2.3 >> 2.1 >> 2.2 Hbg 10.6 >> 10.9 >> 10.8 >> 11.0  GI  panel negative   Family Communication: None present. Pt is able to update.   Disposition: Status is: Inpatient Remains inpatient appropriate because:  --remains on IV diuretics --persistent ileus --Not yet medically stable for d/c.     Planned Discharge Destination: Home with Home Health    Time spent: 42 minutes  Author: Pennie Banter, DO 04/01/2023 3:12 PM  For on call review [email or web address].

## 2023-04-01 NOTE — Telephone Encounter (Signed)
Patient Product/process development scientist completed.    The patient is insured through [employer]. Patient has Medicare and is not eligible for a copay card, but may be able to apply for patient assistance or Medicare RX Payment Plan (Patient Must reach out to their plan, if eligible for payment plan), if available.    Ran test claim for enoxaparin (Lovenox) 150 mg and the current 10 day co-pay is $196.71 due to a $450.00 deductible.   This test claim was processed through [facility]- copay amounts may vary at other pharmacies due to pharmacy/plan contracts, or as the patient moves through the different stages of their insurance plan.     Roland Earl, CPHT Pharmacy Technician III Certified Patient Advocate [facility] Patient Advocate Team Direct Number: [phone]  Fax: [phone]

## 2023-04-01 NOTE — Progress Notes (Signed)
Unable to do the enema due to lack of help, charge nurse aware, will pass it to the night shift

## 2023-04-01 NOTE — Progress Notes (Signed)
Patient had 2 clear liquid stools this evening. States he does not feel constipated. Bowel sounds active x 4. TX completed as ordered.

## 2023-04-01 NOTE — TOC Progression Note (Signed)
Transition of Care [facility]) - Progression Note    Patient Details  Name: [name] MRN: [id number] Date of Birth: 1956-01-24  [facility]) CM/SW Contact  Truddie Hidden, RN Phone Number: 04/01/2023, 11:19 AM  Clinical Narrative:    TOC continuing to follow patient's progress throughout discharge planning.   Expected Discharge Plan: Home w Home Health Services Barriers to Discharge: Continued Medical Work up  Expected Discharge Plan and Services     Post Acute Care Choice: Home Health Living arrangements for the past 2 months: Single Family Home                                       Social Determinants of Health (SDOH) Interventions SDOH Screenings   Food Insecurity: No Food Insecurity (03/25/2023)  Housing: Low Risk  (03/25/2023)  Transportation Needs: No Transportation Needs (03/25/2023)  Utilities: Not At Risk (03/25/2023)  Financial Resource Strain: High Risk (07/28/2021)   Received from [facility], [facility]  Physical Activity: Sufficiently Active (07/28/2021)   Received from [facility], [facility]  Social Connections: Moderately Integrated (03/25/2023)  Stress: Stress Concern Present (08/01/2021)   Received from [facility], [facility]  Tobacco Use: Medium Risk (03/25/2023)    Readmission Risk Interventions    03/25/2023   10:53 AM  Readmission Risk Prevention Plan  Transportation Screening Complete  PCP or Specialist Appt within 5-7 Days Complete  Home Care Screening Complete  Medication Review (RN CM) Complete

## 2023-04-01 NOTE — Consult Note (Signed)
PHARMACY - ANTICOAGULATION CONSULT NOTE  Pharmacy Consult for warfarin & enoxaparin Indication: DVT  Allergies  Allergen Reactions   Bacitracin Hives   Vancomycin Other (See Comments) and Rash    Red man syndrome.   Ketorolac     Other Reaction(s): Kidney Disorder  Other reaction(s): Kidney Disorder   Spironolactone     Other Reaction(s): Unknown  Other reaction(s): Unknown   Patient Measurements: Height: 4\' 10"  (147.3 cm) Weight: (!) 140.6 kg (309 lb 15.5 oz) IBW/kg (Calculated) : 45.4  Vital Signs: Temp: 98.1 F (36.7 C) (01/27 0100) Temp Source: Oral (01/27 0100) BP: 110/72 (01/27 0100) Pulse Rate: 68 (01/27 0100)  Labs: Recent Labs    03/30/23 0511 03/31/23 0502 04/01/23 0510  HGB 11.0*  --   --   HCT 34.2*  --   --   PLT 158  --   --   LABPROT 23.8* 23.6* 18.7*  INR 2.1* 2.1* 1.5*  CREATININE 0.82 0.80 0.80   Estimated Creatinine Clearance: 105.8 mL/min (by C-G formula based on SCr of 0.8 mg/dL).  Medical History: Past Medical History:  Diagnosis Date   Depression    History of blood clots 08/13/2011   History of left knee replacement    HTN (hypertension)    Lymphedema    lt leg-per pt   PE (pulmonary thromboembolism) (HCC)    Pressure injury of skin, unspecified injury stage, unspecified location    S/P AKA (above knee amputation) bilateral (HCC)    rt 11/24/07 and lt 01/11/17   Warfarin INR 1.2 on 03/19/2023 after 2 weeks on warfarin 5 mg daily. Per note review - warfarin dose was changed to 7.5 mg three times a week and 5 mg on the other four days.  INR on admission 1.3 (03/23/2023). Therapeutic enoxaparin 1mg /kg q 12hr initiated as well.  Assessment: PMH significant of stage IV prostate cancer, recurrent DVT and PE, lymphedema, hypertension, status post AKA bilaterally, pressure ulcers. Patient presents with recurrent DVT despite eliquis use. INR sub-therapeutic on admission and sub-therapeutic on last outpatient check as well. Pharmacy has been  consulted to manage this patient's warfarin.   1/27: INR dropped from 2.1 to 1.5 after warfarin dose decreased. No s/sx of bleeding noted. Surgery team following due to colonic Ileus. Multiple large BM documented 1/26 following aggressive bowel regimen.  Goal of Therapy:  INR 2-3 Monitor platelets by anticoagulation protocol: Yes  Warfarin:  Date INR Assessment Warfarin Dose  Comments  1/18 1.3 SUBtherapeutic --- Enoxaparin 1mg /kg q12 hrs  1/19 ---  7.5 mg Enoxaparin 1mg /kg q12 hrs  1/20 1.3 SUBtherapeutic 10 mg  Enoxaparin 1mg /kg q12 hrs  1/21 1.3  SUBtherapeutic 10 mg Enoxaparin 1mg /kg q12 hrs  1/22 1.3 SUBtherapeutic 15 mg  Enoxaparin 1mg /kg q12 hrs  1/23 1.3 SUBtherapeutic 15 mg Enoxaparin 1mg /kg q12 hrs  1/24 1.6 SUBtherapeutic 15 mg Enoxaparin 1mg /kg q12 hrs  1/25 2.1 Therapeutic x 1 7.5 mg Enoxaparin 1mg /kg q12 hrs  1/26 2.1 Therapeutic x 2 7.5mg  D/C enoxaparin  1/27 1.5 SUBtherapeutic  Resume enoxaparin               Plan:  Resume therapeutic enoxaparin today due to significant drop in INR, recurrent VTE and active cancer. Warfarin 15mg  po today. Expected need warfarin 10-12mg  per day to keep INR therapeutic (outpatient warfarin dose was NOT stable prior to admission). No new drug-drug interactions noted Daily INR for now CBC stable; monitor at least every 72 hours   Drayke Grabel Rodriguez-Guzman PharmD, BCPS 04/01/2023 8:08  AM

## 2023-04-01 NOTE — Plan of Care (Signed)

## 2023-04-02 ENCOUNTER — Telehealth: Payer: Self-pay | Admitting: *Deleted

## 2023-04-02 DIAGNOSIS — I82403 Acute embolism and thrombosis of unspecified deep veins of lower extremity, bilateral: Secondary | ICD-10-CM | POA: Diagnosis not present

## 2023-04-02 DIAGNOSIS — K5900 Constipation, unspecified: Secondary | ICD-10-CM

## 2023-04-02 DIAGNOSIS — C7951 Secondary malignant neoplasm of bone: Secondary | ICD-10-CM | POA: Diagnosis not present

## 2023-04-02 DIAGNOSIS — K5641 Fecal impaction: Secondary | ICD-10-CM | POA: Diagnosis not present

## 2023-04-02 DIAGNOSIS — R197 Diarrhea, unspecified: Secondary | ICD-10-CM | POA: Diagnosis not present

## 2023-04-02 DIAGNOSIS — E878 Other disorders of electrolyte and fluid balance, not elsewhere classified: Secondary | ICD-10-CM

## 2023-04-02 DIAGNOSIS — K567 Ileus, unspecified: Secondary | ICD-10-CM | POA: Diagnosis not present

## 2023-04-02 DIAGNOSIS — C61 Malignant neoplasm of prostate: Secondary | ICD-10-CM | POA: Diagnosis not present

## 2023-04-02 LAB — BASIC METABOLIC PANEL
Anion gap: 9 (ref 5–15)
BUN: 14 mg/dL (ref 8–23)
CO2: 29 mmol/L (ref 22–32)
Calcium: 9.6 mg/dL (ref 8.9–10.3)
Chloride: 102 mmol/L (ref 98–111)
Creatinine, Ser: 0.82 mg/dL (ref 0.61–1.24)
GFR, Estimated: >60 mL/min (ref >60)
Glucose, Bld: 97 mg/dL (ref 70–99)
Potassium: 3.3 mmol/L — ABNORMAL LOW (ref 3.5–5.1)
Sodium: 140 mmol/L (ref 135–145)

## 2023-04-02 LAB — PROTIME-INR
INR: 1.7 — ABNORMAL HIGH (ref 0.8–1.2)
Prothrombin Time: 20.1 s — ABNORMAL HIGH (ref 11.4–15.2)

## 2023-04-02 MED ORDER — POTASSIUM CHLORIDE CRYS ER 20 MEQ PO TBCR
40.0000 meq | EXTENDED_RELEASE_TABLET | Freq: Two times a day (BID) | ORAL | Status: AC
  Administered 2023-04-02 (×2): 40 meq via ORAL
  Filled 2023-04-02 (×2): qty 2

## 2023-04-02 MED ORDER — WARFARIN SODIUM 7.5 MG PO TABS
15.0000 mg | ORAL_TABLET | Freq: Once | ORAL | Status: AC
  Administered 2023-04-02: 15 mg via ORAL
  Filled 2023-04-02: qty 2

## 2023-04-02 MED ORDER — POLYETHYLENE GLYCOL 3350 17 G PO PACK
17.0000 g | PACK | Freq: Two times a day (BID) | ORAL | Status: DC
  Administered 2023-04-02 – 2023-04-04 (×2): 17 g via ORAL
  Filled 2023-04-02 (×6): qty 1

## 2023-04-02 MED ORDER — ENOXAPARIN SODIUM 150 MG/ML IJ SOSY
1.0000 mg/kg | PREFILLED_SYRINGE | Freq: Two times a day (BID) | INTRAMUSCULAR | Status: DC
  Administered 2023-04-02 – 2023-04-04 (×4): 141 mg via SUBCUTANEOUS
  Filled 2023-04-02 (×5): qty 0.94

## 2023-04-02 MED ORDER — PEG 3350-KCL-NA BICARB-NACL 420 G PO SOLR
4000.0000 mL | Freq: Once | ORAL | Status: AC
  Administered 2023-04-02: 4000 mL via ORAL
  Filled 2023-04-02: qty 4000

## 2023-04-02 NOTE — Consult Note (Signed)
PHARMACY - ANTICOAGULATION CONSULT NOTE  Pharmacy Consult for warfarin & enoxaparin (bridging) Indication: DVT  Allergies  Allergen Reactions   Bacitracin Hives   Vancomycin Other (See Comments) and Rash    Red man syndrome.   Ketorolac     Other Reaction(s): Kidney Disorder  Other reaction(s): Kidney Disorder   Spironolactone     Other Reaction(s): Unknown  Other reaction(s): Unknown   Patient Measurements: Height: 4\' 10"  (147.3 cm) Weight: (!) 141.3 kg (311 lb 8.2 oz) IBW/kg (Calculated) : 45.4  Vital Signs: Temp: 98.1 F (36.7 C) (01/28 0740) Temp Source: Oral (01/28 0740) BP: 110/68 (01/28 0740) Pulse Rate: 60 (01/28 0740)  Labs: Recent Labs    03/31/23 0502 04/01/23 0510 04/02/23 0515  LABPROT 23.6* 18.7* 20.1*  INR 2.1* 1.5* 1.7*  CREATININE 0.80 0.80 0.82   Estimated Creatinine Clearance: 103.6 mL/min (by C-G formula based on SCr of 0.82 mg/dL).  Medical History: Past Medical History:  Diagnosis Date   Depression    History of blood clots 08/13/2011   History of left knee replacement    HTN (hypertension)    Lymphedema    lt leg-per pt   PE (pulmonary thromboembolism) (HCC)    Pressure injury of skin, unspecified injury stage, unspecified location    S/P AKA (above knee amputation) bilateral (HCC)    rt 11/24/07 and lt 01/11/17   Warfarin INR 1.2 on 03/19/2023 after 2 weeks on warfarin 5 mg daily. Per note review - warfarin dose was changed to 7.5 mg three times a week and 5 mg on the other four days.  INR on admission 1.3 (03/23/2023). Therapeutic enoxaparin 1mg /kg q 12hr initiated as well.  Assessment: PMH significant of stage IV prostate cancer, recurrent DVT and PE, lymphedema, hypertension, status post AKA bilaterally, pressure ulcers. Patient presents with recurrent DVT despite eliquis use. INR sub-therapeutic on admission and sub-therapeutic on last outpatient check as well. Pharmacy has been consulted to manage this patient's warfarin.    1/27: INR dropped from 2.1 to 1.5 after warfarin dose decreased. No s/sx of bleeding noted. Surgery team following due to colonic Ileus. Multiple large BM documented 1/26 following aggressive bowel regimen.  Goal of Therapy:  INR 2-3 Monitor platelets by anticoagulation protocol: Yes  Lovenox:  1mg /kg q12h  Warfarin:  Date INR Assessment Warfarin Dose  Comments  1/18 1.3 SUBtherapeutic --- Enoxaparin 1mg /kg q12 hrs  1/19 ---  7.5 mg Enoxaparin 1mg /kg q12 hrs  1/20 1.3 SUBtherapeutic 10 mg  Enoxaparin 1mg /kg q12 hrs  1/21 1.3  SUBtherapeutic 10 mg Enoxaparin 1mg /kg q12 hrs  1/22 1.3 SUBtherapeutic 15 mg  Enoxaparin 1mg /kg q12 hrs  1/23 1.3 SUBtherapeutic 15 mg Enoxaparin 1mg /kg q12 hrs  1/24 1.6 SUBtherapeutic 15 mg Enoxaparin 1mg /kg q12 hrs  1/25 2.1 Therapeutic x 1 7.5 mg Enoxaparin 1mg /kg q12 hrs  1/26 2.1 Therapeutic x 2 7.5mg  D/C enoxaparin  1/27 1.5 SUBtherapeutic 15 mg Resume enoxaparin  1/28 1.7 SUBtherapeutic           Plan:  INR subtherapeutic. on therapeutic enoxaparin 1 mg/kg q12h for bridging with subtherapeutic INR ( recurrent VTE and active cancer.) Warfarin 15mg  po x 1 again today. Expected need warfarin 10-12mg  per day to keep INR therapeutic (outpatient warfarin dose was NOT stable prior to admission). No new drug-drug interactions noted Daily INR for now CBC stable; monitor at least every 72 hours   Bari Mantis PharmD Clinical Pharmacist 04/02/2023

## 2023-04-02 NOTE — Progress Notes (Signed)
Occupational Therapy Treatment Patient Details Name: [name] MRN: [id number] DOB: 06-17-1955 Today's Date: 04/02/2023   History of present illness Pt is a 68 y.o. male presenting to hospital 03/22/23 with c/o whole body feeling swollen.  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  PMH includes htn, recurrent DVT and PE, recent acute L leg DVT, prostate CA, cellulitis R leg, lymphedema, B AKA, pressure ulcers, R hip sx, neck sx, R shoulder sx, spine sx.   OT comments  Pt is supine in bed on arrival. Pleasant and agreeable to OT session. He denies pain. Pt performed bed mobility with MOD I and lateral scoot transfer from bed<>recliner with SUP, increased time and extended rest break between d/t continued issues with swelling/fluid. Discussed performance of LB ADLs and pt feels comfortable that he would be able to do these now. Pt has all needed equipment at home. Pt returned to bed with all needs in place and will cont to require skilled acute OT services to maximize his safety and IND to return to PLOF.       If plan is discharge home, recommend the following:  Assistance with cooking/housework;Help with stairs or ramp for entrance;Assist for transportation   Equipment Recommendations  None recommended by OT    Recommendations for Other Services      Precautions / Restrictions Restrictions Weight Bearing Restrictions Per Provider Order: Yes       Mobility Bed Mobility Overal bed mobility: Modified Independent Bed Mobility: Supine to Sit, Sit to Supine                Transfers Overall transfer level: Needs assistance Equipment used: None Transfers: Bed to chair/wheelchair/BSC            Lateral/Scoot Transfers: Supervision General transfer comment: SUP for lateral scoot transfer EOB<>recliner to R side to get from bed to recliner and to the L side to get back in bed from recliner     Balance Overall balance  assessment: Needs assistance Sitting-balance support: No upper extremity supported, Feet unsupported Sitting balance-Leahy Scale: Good                                     ADL either performed or assessed with clinical judgement   ADL Overall ADL's : At baseline;Modified independent                                       General ADL Comments: appears closer to his baseline today; able to perform lateral scoot transfer bed<>chair with SUP increased time; likely SUP with ADLs    Extremity/Trunk Assessment              Vision       Perception     Praxis      Cognition Arousal: Alert Behavior During Therapy: WFL for tasks assessed/performed Overall Cognitive Status: Within Functional Limits for tasks assessed                                 General Comments: very talkative        Exercises      Shoulder Instructions       General Comments VSS    Pertinent Vitals/ Pain  Pain Assessment Pain Assessment: No/denies pain  Home Living                                          Prior Functioning/Environment              Frequency  Min 1X/week        Progress Toward Goals  OT Goals(current goals can now be found in the care plan section)  Progress towards OT goals: Progressing toward goals  Acute Rehab OT Goals Patient Stated Goal: improve edema OT Goal Formulation: With patient Time For Goal Achievement: 04/11/23 Potential to Achieve Goals: Good  Plan      Co-evaluation                 AM-PAC OT "6 Clicks" Daily Activity     Outcome Measure   Help from another person eating meals?: None Help from another person taking care of personal grooming?: None Help from another person toileting, which includes using toliet, bedpan, or urinal?: A Little Help from another person bathing (including washing, rinsing, drying)?: A Little Help from another person to put on and taking  off regular upper body clothing?: None Help from another person to put on and taking off regular lower body clothing?: A Little 6 Click Score: 21    End of Session    OT Visit Diagnosis: Other abnormalities of gait and mobility (R26.89);Muscle weakness (generalized) (M62.81)   Activity Tolerance Patient tolerated treatment well   Patient Left in bed   Nurse Communication          Time: 8119-1478 OT Time Calculation (min): 28 min  Charges: OT General Charges $OT Visit: 1 Visit OT Treatments $Therapeutic Activity: 23-37 mins  Lise Pincus, OTR/L  04/02/23, 11:18 AM   [name] 04/02/2023, 11:16 AM

## 2023-04-02 NOTE — Progress Notes (Signed)
Progress Note   Patient: [name] WGN:[id number] DOB: [birth year] DOA: 03/23/2023     8 DOS: the patient was seen and examined on 04/02/2023   Brief hospital course:  "[name] is a 68 y.o. male with medical history significant of stage IV prostate cancer, recurrent DVT and PE, lymphedema, hypertension, status post AKA bilaterally, pressure ulcers presenting with recurrent DVT.  Patient noted to have been admitted January 2 January 6 for acute left-sided DVT.  Had DVT recurrence despite Eliquis use.  Was transition from heparin to Lovenox at discharge.  Patient states he was unable to afford it and was subsequently transition to Coumadin by PCP.  Coumadin Level has been subtherapeutic per report.  Has had worsening shortness of breath lower extremity swelling and pain x 2 to 3 days.  Noted baseline bilateral AKA.  Mild orthopnea.  No chest pain.  ... Lower extremity ultrasound with occlusive thrombus in the proximal segment of right femoral vein.  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  Pt was admitted with for further evaluation and management of RLE occlusive DVT.  Vascular surgery was consulted.  Further hospital course and management as outlined below.  Hospital course prolonged and complicated by severe volume overload and edema/anasarca requiring aggressive IV diuresis that is still underway.  Pt also developed ileus vs stool impaction that is requiring aggressive bowel regimen including enemas.      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 High thromboembolism risk in the setting of underlying active malignancy. Noted recent for similar issues January 2 to January 6 and discharged on Lovenox Was  unable to afford Lovenox and was transitioned to Coumadin in outpt setting without the bridge, therefore was pro-coaguable for a time.   INR remained subtherapeutic at 1.3 for several days, was briefly in range at 2.1 x 2 days, now subtherapeutic again. Chest CT is negative for PE --Pharmacy is managing anticoagulation --Resumed Lovenox bridge until INR back at goal --Coumadin dosing per pharmacy  --Daily INR's      Hypokalemia - on aggressive replacement daily with diuresis Due to diuresis and also GI losses  Hypomagnesemia --Supplement potassium and magnesium PRN     Acute on chronic diastolic CHF Anasarca, Severe peripheral edema -- improving slowly Pt was severely fluid overloaded, with increased edema in both thighs >> scrotum and trunk Progressive shortness of breath and generalized swelling in the setting of active malignancy and recurrent DVT 2D echo shows an LVEF of 55 - 60%. Normal RV size and function Concern for possible Cor Pulmonale related to recurrent VTE, but CTA chest on admission was negative for pulmonary emboli, could have tiny clots not detected on CT.  He continues to have progressive peripheral edema. 1/28 -- renal function stable, edema improving --Continue IV Lasix 40 mg BID  --Anticipate close to euvolemic for transition to PO Lasix in next couple of days --Not much effect with IV albumin preceding lasix when tried --Resumed Lisinopril at 10mg  daily --Strict Io's, daily weights --Monitor renal function, electrolytes    Colonic Ileus -  Diarrhea - mucus liquid stools likely overflow Abdominal pain - post bowel movements, suspect due to retained stool in colon CT abd/pelvis w/o contrast on 1/21 - no acute findings but showed moderate stool  burden in the colon.   GI pathogen panel is negative. Very low suspicion C diff given low frequency of episodes, no leukocytosis, no fevers 1/23: Repeat xray abdomen today shows persistent stool burden and colonic  distention (?ileus) 1/25: persistent ileus.  Soap suds enema given with multiple BM's after 1/26--27: still distended.  SMOG enema, Go Lytely with some results 1/28: still extremely distended --Consulted general surgery for input - ordered enema, given 1/25.  Signed off. --1/27 GoLytely prep and SMOG enema  --1/28  seen by GI today after curb-side question asked yesterday based on patient question regarding procedures to intervene on this persistent problem.  See Consult Note.  Note that electrolytes are being monitored daily and aggressively replaced.  Calcium is normal.  K has significantly improved and he is on daily replacement given ongoing diuresis. --No GI intervention recommended --Avoid anti-diarrheals --Continue lactulose 20 g BID, Senna-S BID, Miralax BID --Repeat SMOG enemas daily as needed --Repeat abdominal X-ray tomorrow AM --Encourage PO hydration --No N/V - diet is okay to continue --Portable KUB's periodically to monitor     Prostate cancer metastatic to bone (HCC) Baseline stage 4 metastatic prostate with bone involvement  --Followed by [name]  --[name] while inpatient    Essential hypertension --Stable, monitor    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 Chronic due to poor ambulation --Wound care consulted, appreciate input    Anxiety/Depression --Continue Bupropion    Morbid Obesity BMI 62 Patient is s/p bilateral AKA --Wheel chair bound but fully independent at home  --PT/OT to follow for maintenance of strength for independent transfers    Ascending thoracic aneurysm Measures 5cm Patient aware of aneurysm but has not seen vascular surgery in years Appreciate vascular surgery input.   --Recommends to follow-up with CT surgery as an outpatient    Contact dermatitis - at sites of adhesives (cardiac leads, prior IV site) --Hydrocortisone cream PRN    Stage III pressure injury Involving the ischial tuberosity I  agree with the wound description/s as outlined. --Continue diligent wound care, frequent off-loading, monitor for s/sx's of infection  Pressure Injury 03/23/23 Ischial tuberosity Left Stage 3 -  Full thickness tissue loss. Subcutaneous fat may be visible but bone, tendon or muscle are NOT exposed. 1 cm x 1 cm x 0.1 cm 50% pink moist 50% yellow (Active)  03/23/23   Location: Ischial tuberosity  Location Orientation: Left  Staging: Stage 3 -  Full thickness tissue loss. Subcutaneous fat may be visible but bone, tendon or muscle are NOT exposed.  Wound Description (Comments): 1 cm x 1 cm x 0.1 cm 50% pink moist 50% yellow  Present on Admission: Yes     Pressure Injury 03/26/23 Buttocks Left;Lateral Stage 2 -  Partial thickness loss of dermis presenting as a shallow open injury with a red, pink wound bed without slough. (Active)  03/26/23 0000  Location: Buttocks  Location Orientation: Left;Lateral  Staging: Stage 2 -  Partial thickness loss of dermis presenting as a shallow open injury with a red, pink wound bed without slough.  Wound Description (Comments):   Present on Admission: Yes           Subjective: Pt seen this AM. Has persistent abdominal distention.  Did have multiple brown liquid/mushy BM's after enema and go lytely yesterday.   Edema improving steadily with diuresis.   Physical Exam: Vitals:   04/01/23 2104 04/02/23 0500 04/02/23 0503 04/02/23 0740  BP: 100/61  102/64 110/68  Pulse: Marland Kitchen)  58  62 60  Resp: 16  16 17   Temp: 97.7 F (36.5 C)  98 F (36.7 C) 98.1 F (36.7 C)  TempSrc: Oral  Oral Oral  SpO2: 96%  94% 92%  Weight:  (!) 141.3 kg    Height:        General exam: awake, alert, no acute distress HEENT: wearing glasses, moist mucus membranes, hearing grossly normal  Respiratory system: on room air, normal respiratory effort. Cardiovascular system: RRR, improved dependent edema Gastrointestinal system: distended but very slightly softer today,  non-tender Central nervous system: A&O x 4. no gross focal neurologic deficits, normal speech Extremities: B/L AKA's, BLE and truncal edema improving Skin: patches of contact dermatitis noted at prior IV site & cardiac lead sites Psychiatry: normal mood, congruent affect, judgement and insight appear normal    Data Reviewed: Notable labs --   K 3.0 >> 3.4 >> 3.1 >> 3.3 Cr stable 0.82  INR 2.1 >> 2.1 >> 1.5 >> 1.7  Last CBC 1/25 --  WBC 2.2 >> 2.3 >> 2.1 >> 2.2 stable Hbg 10.6 >> 10.9 >> 10.8 >> 11.0 stable  GI panel negative   Family Communication: None present. Pt is able to update.   Disposition: Status is: Inpatient Remains inpatient appropriate because:  --remains on IV diuretics --persistent ileus --electrolyte derangements --Not yet medically stable for d/c.     Planned Discharge Destination: Home with Home Health    Time spent: 42 minutes  Author: Pennie Banter, DO 04/02/2023 3:13 PM  For on call review [email or web address].

## 2023-04-02 NOTE — Telephone Encounter (Signed)
Pt. Is in hospital room 204 and he has a question about cancer. His phone in the bed [phone]

## 2023-04-02 NOTE — Consult Note (Signed)
[name] Minium, MD [facility]  [address], [address] [place], [place] [address] Phone: [phone] Fax : [phone]  Consultation  Referring Provider:     Dr. Denton [name] Primary Care Physician:  [name] Housekeeper, MD Primary Gastroenterologist:          [facility] Reason for Consultation:     Constipation  Date of Admission:  03/23/2023 Date of Consultation:  04/02/2023         HPI:   [name] is a 68 y.o. 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  The patient has been having constipation was seen by surgery back in January 25 at that time the patient was recommended to take laxatives and enemas to attack the problem from both the bottom and the top.  The patient had distention and a CT scan that showed:  IMPRESSION: 1. No acute intra-abdominal or pelvic pathology. 2. Moderate colonic stool burden. No evidence of obstruction. 3. Cholelithiasis. 4. Extensive osseous sclerotic metastasis. 5.  Aortic Atherosclerosis   The patient then had subsequent KUBs that showed stable appearance when compared to prior exams.   The patient's KUB on January 23 was consistent with colonic ileus.  The patient was getting some results with the enemas with some brown liquid returning with the enemas and states that he took 2 L of GoLytely with some brown material coming out at the time. The patient's potassium has been running low and his potassium this morning was 3.3.  Patient had a GI panel that was negative and back on the 19th of this month the patient had lab work that showed his calcium to be low.  Past Medical History:  Diagnosis Date   Depression    History of blood clots 08/13/2011   History of left knee replacement    HTN (hypertension)    Lymphedema    lt leg-per pt   PE (pulmonary thromboembolism) (HCC)    Pressure injury of skin, unspecified injury stage, unspecified location    S/P AKA (above knee  amputation) bilateral (HCC)    rt 11/24/07 and lt 01/11/17    Past Surgical History:  Procedure Laterality Date   ACHILLES TENDON SURGERY Left    per pt   bone clip removal Left 10/04/2015   knee   CARDIAC SURGERY  11/2012   cath.-per pt   CERVICAL FUSION  07/28/2015   per pt   EPIGASTRIC HERNIA REPAIR     per pt   hematoma removal Left 11/02/2015   knee   HIP SURGERY Right    x8 per pt   IR IMAGING GUIDED PORT INSERTION  11/14/2022   KNEE FUSION Right    KNEE JOINT MANIPULATION Left 09/11/2011   KNEE SURGERY Left    x3   leg stump Left 03/2017   leg surgery-per pt   NECK SURGERY  02/2015   plates-per pt   REPLACEMENT TOTAL KNEE Right    REPLACEMENT TOTAL KNEE Left 07/02/2011   again in 07/10/2011 per pt   s/p of bilateral AKA Right    screen filter removal  10/20/2008   for embolism per pt   screen filter replacement     x3   SHOULDER SURGERY Right    x2-per pt   SPINE SURGERY  02/11/2015   vertebra spacer-per pt   toe nail removal Right    per pt   VARICOSE VEIN SURGERY  x5-per pt    Prior to Admission medications   Medication Sig Start Date End Date Taking? Authorizing Provider  buPROPion (WELLBUTRIN XL) 300 MG 24 hr tablet Take 300 mg by mouth daily. 07/26/22  Yes [provider]  diphenoxylate-atropine (LOMOTIL) 2.5-0.025 MG tablet Take 1 tablet by mouth 4 (four) times daily as needed for diarrhea or loose stools. 03/11/23  Yes Arnetha Courser, MD  gabapentin (NEURONTIN) 300 MG capsule Take 2 capsules (600 mg total) by mouth 3 (three) times daily. 02/15/23 06/15/23 Yes Borders, Daryl Eastern, NP  leptospermum manuka honey (MEDIHONEY) PSTE paste Apply 1 Application topically daily. 03/11/23  Yes Arnetha Courser, MD  lidocaine-prilocaine (EMLA) cream Apply 1 Application topically as needed. 11/27/22  Yes Earna Coder, MD  lisinopril (ZESTRIL) 20 MG tablet Take 20 mg by mouth 2 (two) times daily. 06/29/19 03/23/23 Yes [provider]  ondansetron  (ZOFRAN) 8 MG tablet Take 1 tablet (8 mg total) by mouth every 8 (eight) hours as needed for nausea or vomiting. 11/29/22  Yes Earna Coder, MD  oxyCODONE-acetaminophen (PERCOCET/ROXICET) 5-325 MG tablet Take 1 tablet by mouth every 12 (twelve) hours as needed for moderate pain (pain score 4-6). 01/02/23  Yes Borders, Daryl Eastern, NP  senna-docusate (SENOKOT-S) 8.6-50 MG tablet Take 2 tablets by mouth 2 (two) times daily as needed for mild constipation. 10/01/22  Yes Marrion Coy, MD  warfarin (COUMADIN) 5 MG tablet Take 5-7.5 mg by mouth daily.   Yes [provider]  darolutamide (NUBEQA) 300 MG tablet Take 2 tablets (600 mg total) by mouth 2 (two) times daily with a meal. 03/29/23   Earna Coder, MD  enoxaparin (LOVENOX) 150 MG/ML injection Inject 0.86 mLs (130 mg total) into the skin every 12 (twelve) hours. Patient not taking: Reported on 03/23/2023 03/11/23   Arnetha Courser, MD    Family History  Problem Relation Age of Onset   Alzheimer's disease Mother    Lung cancer Father      Social History   Tobacco Use   Smoking status: Never   Smokeless tobacco: Former  Building services engineer status: Never Used  Substance Use Topics   Alcohol use: Yes    Alcohol/week: 3.0 standard drinks of alcohol    Types: 3 Glasses of wine per week   Drug use: Never    Allergies as of 03/22/2023 - Review Complete 03/22/2023  Allergen Reaction Noted   Bacitracin Hives 10/19/2016   Vancomycin Other (See Comments) and Rash 10/19/2016   Ketorolac  07/30/2015   Spironolactone  03/22/2017    Review of Systems:    All systems reviewed and negative except where noted in HPI.   Physical Exam:  Vital signs in last 24 hours: Temp:  [97.5 F (36.4 C)-98.1 F (36.7 C)] 98.1 F (36.7 C) (01/28 0740) Pulse Rate:  [58-66] 60 (01/28 0740) Resp:  [16-18] 17 (01/28 0740) BP: (100-112)/(61-71) 110/68 (01/28 0740) SpO2:  [92 %-96 %] 92 % (01/28 0740) Weight:  [141.3 kg] 141.3 kg (01/28  0500) Last BM Date : 04/02/23 General:   Pleasant, cooperative in NAD Head:  Normocephalic and atraumatic. Eyes:   No icterus.   Conjunctiva pink. PERRLA. Ears:  Normal auditory acuity. Neck:  Supple; no masses or thyroidomegaly Lungs: Respirations even and unlabored. Lungs clear to auscultation bilaterally.   No wheezes, crackles, or rhonchi.  Heart:  Regular rate and rhythm;  Without murmur, clicks, rubs or gallops Abdomen:  Soft, positive distention, nontender. Normal bowel sounds. No  appreciable masses or hepatomegaly.  No rebound or guarding.  Rectal:  Not performed. Msk:  Symmetrical without gross deformities.  S  Extremities: Bilateral AKA  neurologic:  Alert and oriented x3;  grossly normal neurologically. Skin:  Intact without significant lesions or rashes. Cervical Nodes:  No significant cervical adenopathy. Psych:  Alert and cooperative. Normal affect.  LAB RESULTS: No results for input(s): "WBC", "HGB", "HCT", "PLT" in the last 72 hours. BMET Recent Labs    03/31/23 0502 04/01/23 0510 04/02/23 0515  NA 139 138 140  K 3.4* 3.1* 3.3*  CL 98 99 102  CO2 30 30 29   GLUCOSE 100* 93 97  BUN 12 13 14   CREATININE 0.80 0.80 0.82  CALCIUM 9.3 9.5 9.6   LFT No results for input(s): "PROT", "ALBUMIN", "AST", "ALT", "ALKPHOS", "BILITOT", "BILIDIR", "IBILI" in the last 72 hours. PT/INR Recent Labs    04/01/23 0510 04/02/23 0515  LABPROT 18.7* 20.1*  INR 1.5* 1.7*    STUDIES: No results found.    Impression / Plan:   Assessment: Principal Problem:   Acute DVT (deep venous thrombosis) (HCC) Active Problems:   Prostate cancer metastatic to bone (HCC)   Diarrhea   Essential hypertension   DVT, lower extremity, recurrent (HCC)   Shortness of breath   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   Ileus [facility])   Yuuki Skeens is a 68 y.o. y/o male with a large stool burden on CT scan with a KUB suggestive of colonic ileus.  Colonic ileus is most likely  caused by electrolyte abnormalities and the patient has had both decreased calcium and decreased potassium.  The patient has had some overflow diarrhea with his taking of a prep with some brown liquid results.  I am now being asked to see the patient for treatment of his colonic ileus versus stool impaction.  The patient is at high risk for constipation and stool impaction with him being immobile from his bilateral AKA's  Plan:  I will treat the patient with repeat of trying to purge out his stool burden as seen on CT scan.  I would recommend the patient be treated for his electrolyte abnormalities since it appears that he has not had a CMP done since the 19th.  These electrolyte abnormalities can contribute to his decreased colonic motility.  The patient has agreed to starting a bowel prep and this will continue until he has less distention.  He also has been told that the diarrhea is because of what is known as overflow diarrhea because it is the only substance that can get around the impacted stool.  The patient has been explained the plan and agrees with it.  Thank you for involving me in the care of this patient.      LOS: 8 days   [name] Minium, MD, MD. Clementeen Graham 04/02/2023, 1:09 PM,  Pager [phone] 7am-5pm  Check AMION for 5pm -7am coverage and on weekends   Note: This dictation was prepared with Dragon dictation along with smaller phrase technology. Any transcriptional errors that result from this process are unintentional.

## 2023-04-02 NOTE — Progress Notes (Signed)
Physical Therapy Treatment Patient Details Name: [name] MRN: [id number] DOB: [birth year] Today's Date: 04/02/2023   History of Present Illness Pt is a 68 y.o. male presenting to hospital 03/22/23 with c/o whole body feeling swollen.  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  PMH includes htn, recurrent DVT and PE, recent acute L leg DVT, prostate CA, cellulitis R leg, lymphedema, B AKA, pressure ulcers, R hip sx, neck sx, R shoulder sx, spine sx.    PT Comments  Pt resting in bed upon PT arrival; pt agreeable to therapy.  During session pt modified independent with bed mobility and SBA with lateral scoot transfer bed to/from recliner (with armrest lowered).  Pt reporting improvement in swelling and demonstrating improved functional mobility.  Will continue to focus on strengthening and w/c level transfers during hospitalization.   If plan is discharge home, recommend the following: A little help with walking and/or transfers;A little help with bathing/dressing/bathroom;Assist for transportation   Can travel by private vehicle        Equipment Recommendations  Other (comment) (pt has needed DME at home)    Recommendations for Other Services       Precautions / Restrictions Precautions Precautions: Fall Precaution Comments: B AKA Restrictions Weight Bearing Restrictions Per Provider Order: Yes Other Position/Activity Restrictions: B AKA     Mobility  Bed Mobility Overal bed mobility: Modified Independent Bed Mobility: Supine to Sit, Sit to Supine     Supine to sit: Modified independent (Device/Increase time), Used rails Sit to supine: Modified independent (Device/Increase time), Used rails        Transfers Overall transfer level: Needs assistance Equipment used: None Transfers: Bed to chair/wheelchair/BSC            Lateral/Scoot Transfers: Supervision General transfer comment: lateral scoot to R bed to  recliner (armrest lowered) and lateral scoot to L recliner to bed (armrest lowered); mild increased time/effort to perform on own; use of bed rails    Ambulation/Gait                   Stairs             Wheelchair Mobility     Tilt Bed    Modified Rankin (Stroke Patients Only)       Balance Overall balance assessment: Needs assistance Sitting-balance support: No upper extremity supported, Feet unsupported Sitting balance-Leahy Scale: Good Sitting balance - Comments: steady reaching within BOS                                    Cognition Arousal: Alert Behavior During Therapy: WFL for tasks assessed/performed Overall Cognitive Status: Within Functional Limits for tasks assessed                                 General Comments: talkative        Exercises      General Comments General comments (skin integrity, edema, etc.): VSS (HR and SpO2 on room air).      Pertinent Vitals/Pain Pain Assessment Pain Assessment: No/denies pain Pain Intervention(s): Limited activity within patient's tolerance, Monitored during session    Home Living                          Prior Function  PT Goals (current goals can now be found in the care plan section) Acute Rehab PT Goals Patient Stated Goal: to return to PLOF PT Goal Formulation: With patient Time For Goal Achievement: 04/11/23 Potential to Achieve Goals: Good Progress towards PT goals: Progressing toward goals    Frequency    Min 1X/week      PT Plan      Co-evaluation              AM-PAC PT "6 Clicks" Mobility   Outcome Measure  Help needed turning from your back to your side while in a flat bed without using bedrails?: None Help needed moving from lying on your back to sitting on the side of a flat bed without using bedrails?: None Help needed moving to and from a bed to a chair (including a wheelchair)?: A Little Help needed  standing up from a chair using your arms (e.g., wheelchair or bedside chair)?: Total Help needed to walk in hospital room?: Total Help needed climbing 3-5 steps with a railing? : Total 6 Click Score: 14    End of Session   Activity Tolerance: Patient tolerated treatment well Patient left: in bed;with call bell/phone within reach;with bed alarm set Nurse Communication: Mobility status PT Visit Diagnosis: Other abnormalities of gait and mobility (R26.89)     Time: 4540-9811 PT Time Calculation (min) (ACUTE ONLY): 26 min  Charges:    $Therapeutic Activity: 23-37 mins PT General Charges $$ ACUTE PT VISIT: 1 Visit                     Hendricks Limes, PT 04/02/23, 5:25 PM

## 2023-04-03 ENCOUNTER — Inpatient Hospital Stay: Payer: Medicare PPO

## 2023-04-03 ENCOUNTER — Encounter
Admission: EM | Disposition: A | Discharge status: Home Health | Payer: Self-pay | Source: Home / Self Care | Attending: Internal Medicine

## 2023-04-03 ENCOUNTER — Inpatient Hospital Stay: Payer: Medicare PPO | Admitting: Anesthesiology

## 2023-04-03 ENCOUNTER — Encounter: Payer: Self-pay | Admitting: Internal Medicine

## 2023-04-03 DIAGNOSIS — R601 Generalized edema: Secondary | ICD-10-CM | POA: Diagnosis present

## 2023-04-03 DIAGNOSIS — I82403 Acute embolism and thrombosis of unspecified deep veins of lower extremity, bilateral: Secondary | ICD-10-CM | POA: Diagnosis not present

## 2023-04-03 HISTORY — PX: BIOPSY: SHX5522

## 2023-04-03 HISTORY — PX: COLONOSCOPY WITH PROPOFOL: SHX5780

## 2023-04-03 LAB — COMPREHENSIVE METABOLIC PANEL WITH GFR
ALT: 21 U/L (ref 0–44)
AST: 24 U/L (ref 15–41)
Albumin: 3.8 g/dL (ref 3.5–5.0)
Alkaline Phosphatase: 64 U/L (ref 38–126)
Anion gap: 9 (ref 5–15)
BUN: 13 mg/dL (ref 8–23)
CO2: 28 mmol/L (ref 22–32)
Calcium: 9.7 mg/dL (ref 8.9–10.3)
Chloride: 102 mmol/L (ref 98–111)
Creatinine, Ser: 0.75 mg/dL (ref 0.61–1.24)
GFR, Estimated: >60 mL/min (ref >60)
Glucose, Bld: 90 mg/dL (ref 70–99)
Potassium: 3.5 mmol/L (ref 3.5–5.1)
Sodium: 139 mmol/L (ref 135–145)
Total Bilirubin: 0.8 mg/dL (ref 0.0–1.2)
Total Protein: 6.8 g/dL (ref 6.5–8.1)

## 2023-04-03 LAB — CBC
HCT: 35.8 % — ABNORMAL LOW (ref 39.0–52.0)
Hemoglobin: 11.7 g/dL — ABNORMAL LOW (ref 13.0–17.0)
MCH: 31.8 pg (ref 26.0–34.0)
MCHC: 32.7 g/dL (ref 30.0–36.0)
MCV: 97.3 fL (ref 80.0–100.0)
Platelets: 234 10*3/uL (ref 150–400)
RBC: 3.68 MIL/uL — ABNORMAL LOW (ref 4.22–5.81)
RDW: 13.6 % (ref 11.5–15.5)
WBC: 2.5 10*3/uL — ABNORMAL LOW (ref 4.0–10.5)
nRBC: 0 % (ref 0.0–0.2)

## 2023-04-03 LAB — PROTIME-INR
INR: 1.9 — ABNORMAL HIGH (ref 0.8–1.2)
Prothrombin Time: 21.9 s — ABNORMAL HIGH (ref 11.4–15.2)

## 2023-04-03 LAB — MAGNESIUM: Magnesium: 2 mg/dL (ref 1.7–2.4)

## 2023-04-03 SURGERY — COLONOSCOPY WITH PROPOFOL
Anesthesia: General

## 2023-04-03 MED ORDER — DEXMEDETOMIDINE HCL IN NACL 80 MCG/20ML IV SOLN
INTRAVENOUS | Status: DC | PRN
  Administered 2023-04-03 (×3): 4 ug via INTRAVENOUS

## 2023-04-03 MED ORDER — PROPOFOL 10 MG/ML IV BOLUS
INTRAVENOUS | Status: AC
  Filled 2023-04-03: qty 40

## 2023-04-03 MED ORDER — PROPOFOL 10 MG/ML IV BOLUS
INTRAVENOUS | Status: DC | PRN
  Administered 2023-04-03: 50 mg via INTRAVENOUS

## 2023-04-03 MED ORDER — POTASSIUM CHLORIDE CRYS ER 20 MEQ PO TBCR
40.0000 meq | EXTENDED_RELEASE_TABLET | Freq: Two times a day (BID) | ORAL | Status: DC
Start: 2023-04-03 — End: 2023-04-03
  Administered 2023-04-03: 40 meq via ORAL
  Filled 2023-04-03: qty 2

## 2023-04-03 MED ORDER — PROPOFOL 500 MG/50ML IV EMUL
INTRAVENOUS | Status: DC | PRN
  Administered 2023-04-03: 75 ug/kg/min via INTRAVENOUS

## 2023-04-03 MED ORDER — EPHEDRINE SULFATE-NACL 50-0.9 MG/10ML-% IV SOSY
PREFILLED_SYRINGE | INTRAVENOUS | Status: DC | PRN
  Administered 2023-04-03 (×3): 5 mg via INTRAVENOUS

## 2023-04-03 MED ORDER — LACTULOSE 10 GM/15ML PO SOLN: 20.0000 g | Freq: Two times a day (BID) | ORAL | Status: DC | PRN

## 2023-04-03 MED ORDER — SODIUM CHLORIDE 0.9 % IV SOLN: INTRAVENOUS | Status: DC | PRN

## 2023-04-03 MED ORDER — WARFARIN SODIUM 7.5 MG PO TABS
15.0000 mg | ORAL_TABLET | Freq: Once | ORAL | Status: AC
  Administered 2023-04-03: 15 mg via ORAL
  Filled 2023-04-03: qty 2

## 2023-04-03 MED ORDER — EPHEDRINE 5 MG/ML INJ
INTRAVENOUS | Status: AC
  Filled 2023-04-03: qty 5

## 2023-04-03 NOTE — Op Note (Signed)
[facility] Gastroenterology Patient Name: [name] Procedure Date: 04/03/2023 10:40 AM MRN: [id number] Account #: [id number] Date of Birth: 29-Feb-1956 Admit Type: Outpatient Age: 68 Room: [facility] ENDO ROOM 4 Gender: Male Note Status: Finalized Instrument Name: Prentice Docker [id number] Procedure:             Colonoscopy Indications:           Decompression of acute non-toxic megacolon Providers:             [name] MD, MD Medicines:             Propofol per Anesthesia Complications:         No immediate complications. Procedure:             Pre-Anesthesia Assessment:                        - Prior to the procedure, a History and Physical was                         performed, and patient medications and allergies were                         reviewed. The patient's tolerance of previous                         anesthesia was also reviewed. The risks and benefits                         of the procedure and the sedation options and risks                         were discussed with the patient. All questions were                         answered, and informed consent was obtained. Prior                         Anticoagulants: The patient has taken no anticoagulant                         or antiplatelet agents. ASA Grade Assessment: III - A                         patient with severe systemic disease. After reviewing                         the risks and benefits, the patient was deemed in                         satisfactory condition to undergo the procedure.                        After obtaining informed consent, the colonoscope was                         passed under direct vision. Throughout the procedure,                         the patient's blood  pressure, pulse, and oxygen                         saturations were monitored continuously. The                         Colonoscope was introduced through the anus and                         advanced to the the  ascending colon for evaluation.                         This was the intended extent. The colonoscopy was                         performed without difficulty. The patient tolerated                         the procedure well. The quality of the bowel                         preparation was poor. Findings:      The perianal and digital rectal examinations were normal.      Liquid stool was found in the entire colon.      The colon was decompressed.      Inflammation characterized by erythema was found in the sigmoid colon.       Biopsies were taken with a cold forceps for histology. Impression:            - Preparation of the colon was poor.                        - Stool in the entire examined colon.                        - Inflammation was found in the sigmoid colon.                         Biopsied. Recommendation:        - Return patient to hospital ward for ongoing care.                        - Resume regular diet.                        - Await pathology results. Procedure Code(s):     --- Professional ---                        [phone], 52, Colonoscopy, flexible; with biopsy, single                         or multiple Diagnosis Code(s):     --- Professional ---                        K59.39, Other megacolon                        K52.9, Noninfective gastroenteritis and colitis,  unspecified CPT copyright 2022 American Medical Association. All rights reserved. The codes documented in this report are preliminary and upon coder review may  be revised to meet current compliance requirements. [name] MD, MD 04/03/2023 11:20:16 AM This report has been signed electronically. Number of Addenda: 0 Note Initiated On: 04/03/2023 10:40 AM Scope Withdrawal Time: 0 hours 4 minutes 20 seconds  Total Procedure Duration: 0 hours 19 minutes 16 seconds  Estimated Blood Loss:  Estimated blood loss: none.      [facility]

## 2023-04-03 NOTE — Consult Note (Signed)
PHARMACY - ANTICOAGULATION CONSULT NOTE  Pharmacy Consult for warfarin & enoxaparin (bridging) Indication: DVT  Allergies  Allergen Reactions   Bacitracin Hives   Vancomycin Other (See Comments) and Rash    Red man syndrome.   Ketorolac     Other Reaction(s): Kidney Disorder  Other reaction(s): Kidney Disorder   Spironolactone     Other Reaction(s): Unknown  Other reaction(s): Unknown   Patient Measurements: Height: 4\' 10"  (147.3 cm) Weight: (!) 141.3 kg (311 lb 8.2 oz) IBW/kg (Calculated) : 45.4  Vital Signs: Temp: 97.2 F (36.2 C) (01/29 1028) Temp Source: Temporal (01/29 1028) BP: 100/67 (01/29 1139) Pulse Rate: 59 (01/29 1139)  Labs: Recent Labs    04/01/23 0510 04/02/23 0515 04/03/23 0517  HGB  --   --  11.7*  HCT  --   --  35.8*  PLT  --   --  234  LABPROT 18.7* 20.1* 21.9*  INR 1.5* 1.7* 1.9*  CREATININE 0.80 0.82 0.75   Estimated Creatinine Clearance: 106.2 mL/min (by C-G formula based on SCr of 0.75 mg/dL).  Medical History: Past Medical History:  Diagnosis Date   Depression    History of blood clots 08/13/2011   History of left knee replacement    HTN (hypertension)    Lymphedema    lt leg-per pt   PE (pulmonary thromboembolism) (HCC)    Pressure injury of skin, unspecified injury stage, unspecified location    S/P AKA (above knee amputation) bilateral (HCC)    rt 11/24/07 and lt 01/11/17   Warfarin INR 1.2 on 03/19/2023 after 2 weeks on warfarin 5 mg daily. Per note review - warfarin dose was changed to 7.5 mg three times a week and 5 mg on the other four days.  INR on admission 1.3 (03/23/2023). Therapeutic enoxaparin 1mg /kg q 12hr initiated as well.  Assessment: PMH significant of stage IV prostate cancer, recurrent DVT and PE, lymphedema, hypertension, status post AKA bilaterally, pressure ulcers. Patient presents with recurrent DVT despite eliquis use. INR sub-therapeutic on admission and sub-therapeutic on last outpatient check as well.  Pharmacy has been consulted to manage this patient's warfarin.   Goal of Therapy:  INR 2-3 Monitor platelets by anticoagulation protocol: Yes  Lovenox:  1mg /kg q12h  Warfarin:  Date INR Assessment Warfarin Dose  Comments  1/18 1.3 SUBtherapeutic --- Enoxaparin 1mg /kg q12 hrs  1/19 ---  7.5 mg Enoxaparin 1mg /kg q12 hrs  1/20 1.3 SUBtherapeutic 10 mg  Enoxaparin 1mg /kg q12 hrs  1/21 1.3  SUBtherapeutic 10 mg Enoxaparin 1mg /kg q12 hrs  1/22 1.3 SUBtherapeutic 15 mg  Enoxaparin 1mg /kg q12 hrs  1/23 1.3 SUBtherapeutic 15 mg Enoxaparin 1mg /kg q12 hrs  1/24 1.6 SUBtherapeutic 15 mg Enoxaparin 1mg /kg q12 hrs  1/25 2.1 Therapeutic x 1 7.5 mg Enoxaparin 1mg /kg q12 hrs  1/26 2.1 Therapeutic x 2 7.5mg  D/C enoxaparin  1/27 1.5 SUBtherapeutic 15 mg Resume enoxaparin  1/28 1.7 SUBtherapeutic 15 mg enoxaparin  1/29 1.9 SUBtherapeutic           Plan:  INR subtherapeutic. On warfarin and therapeutic enoxaparin 1 mg/kg q12h for bridging with subtherapeutic INR ( recurrent VTE and active cancer.) Warfarin 15mg  po x 1 again today. Expected need warfarin 10-12mg  per day to keep INR therapeutic (outpatient warfarin dose was NOT stable prior to admission). No new drug-drug interactions noted Daily INR for now CBC stable; monitor at least every 72 hours   Bari Mantis PharmD Clinical Pharmacist 04/03/2023

## 2023-04-03 NOTE — Progress Notes (Signed)
PT Cancellation Note  Patient Details Name: [name] MRN: [id number] DOB: 06-Sep-1955   Cancelled Treatment:    Reason Eval/Treat Not Completed: Patient at procedure or test/unavailable Patient is off unit for procedure. Will re-attempt as patient available.   Vidhi Delellis 04/03/2023, 10:49 AM

## 2023-04-03 NOTE — Progress Notes (Signed)
Progress Note   Patient: [name] JYN:[id number] DOB: [birth year] DOA: 03/23/2023     9 DOS: the patient was seen and examined on 04/03/2023   Brief hospital course: 67yo with h/o stage IV prostate CA, recurrent VTE, HTN, PVD s/p B AKA, and pressure ulcers who presented on 1/18 with SOB, found to have recurrent DVT despite Eliquis and then Coumadin (no bridge so not failure)  He was unable to afford Lovenox as an outpatient.  During the hospitalization, he had volume overload and has been diuresed.  Also with ileus vs. Stool impaction that is being treated with aggressive bowel regimen.  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 Noted recent for similar issues January 2 to January 6 and discharged on Lovenox Was unable to afford Lovenox and was transitioned to Coumadin in outpt setting without the bridge, therefore was pro-coaguable for a time INR remained subtherapeutic at 1.3 for several days, was briefly in range at 2.1 x 2 days, now subtherapeutic again Chest CT is negative for PE Pharmacy is managing anticoagulation Resumed Lovenox bridge until INR back at goal Coumadin dosing per pharmacy  Daily INR's   Ileus CT abd/pelvis w/o contrast on 1/21 - no acute findings but showed moderate stool burden in the colon.   GI pathogen panel is negative Treated with aggressive bowel regimen Consulted general surgery for input - ordered enema, given 1/25.  Signed off. 1/27 GoLytely prep and SMOD enema Avoid anti-diarrheals Continue lactulose 20 g BID prn, Senna-S BID Underwent colonoscopy today and had large bowel movement at time of scope without significant findings from scope itself Inflammation noted in  sigmoid colon, biopsied    Hypokalemia  On aggressive replacement daily with diuresis Due to diuresis and also GI losses   Hypomagnesemia Supplement potassium and magnesium PRN   Acute on chronic diastolic CHF/Anasarca, Severe peripheral edema  Improving slowly Pt was severely fluid overloaded, with increased edema in both thighs >> scrotum and trunk Progressive shortness of breath and generalized swelling in the setting of active malignancy and recurrent DVT 2D echo shows an LVEF of 55 - 60%. Normal RV size and function Concern for possible Cor Pulmonale related to recurrent VTE, but CTA chest on admission was negative for pulmonary emboli, could have tiny clots not detected on CT He continues to have progressive peripheral edema Continue IV Lasix 40 mg BID  Anticipate close to euvolemic for transition to PO Lasix in next couple of days Not much effect with IV albumin preceding lasix when tried Resumed Lisinopril at 10mg  daily Strict Io's, daily weights Monitor renal function, electrolytes He has been eating double portions for meals; will change to heart healthy and fluid-restricted diet   Prostate cancer metastatic to bone  Baseline stage 4 metastatic prostate with bone involvement  Followed by [name] while inpatient Sees [name] with palliative Starting GOC conversation and he is considering transition to DNR    Essential hypertension Stable, monitor Continue lisinopril    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 Chronic due to poor ambulation Wound care consulted, appreciate input   Anxiety/Depression Continue Bupropion   Morbid Obesity Body mass index is 65.11 kg/m.Marland Kitchen  Weight loss should be encouraged Outpatient PCP/bariatric medicine f/u encouraged  Patient is  s/p bilateral AKA and wheelchair-bound but fully independent at home  PT/OT to follow for maintenance of strength for independent transfers   Ascending thoracic  aneurysm Measures 5cm Patient aware of aneurysm but has not seen vascular surgery in years Appreciate vascular surgery input Recommends to follow-up with CT surgery as an outpatient    Contact dermatitis  At sites of adhesives (cardiac leads, prior IV site) Hydrocortisone cream PRN   Stage III pressure injury Pressure Injury 03/23/23 Ischial tuberosity Left Stage 3 -  Full thickness tissue loss. Subcutaneous fat may be visible but bone, tendon or muscle are NOT exposed. 1 cm x 1 cm x 0.1 cm 50% pink moist 50% yellow (Active)  03/23/23   Location: Ischial tuberosity  Location Orientation: Left  Staging: Stage 3 -  Full thickness tissue loss. Subcutaneous fat may be visible but bone, tendon or muscle are NOT exposed.  Wound Description (Comments): 1 cm x 1 cm x 0.1 cm 50% pink moist 50% yellow  Present on Admission: Yes     Pressure Injury 03/26/23 Buttocks Left;Lateral Stage 2 -  Partial thickness loss of dermis presenting as a shallow open injury with a red, pink wound bed without slough. (Active)  03/26/23 0000  Location: Buttocks  Location Orientation: Left;Lateral  Staging: Stage 2 -  Partial thickness loss of dermis presenting as a shallow open injury with a red, pink wound bed without slough.  Wound Description (Comments):   Present on Admission: Yes      Consultants: Vascular surgery Surgery GI PT OT TOC team  Procedures: DVT US 1/18 Echocardiogram 1/18  Antibiotics: None    30 Day Unplanned Readmission Risk Score    Flowsheet Row ED to Hosp-Admission (Current) from 03/23/2023 in [facility]  30 Day Unplanned Readmission Risk Score (%) 17.16 Filed at 04/03/2023 0401       This score is the patient's risk of an unplanned readmission within 30 days of being discharged (0 -100%). The score is based on dignosis, age, lab data, medications, orders, and past utilization.   Low:  0-14.9   Medium: 15-21.9   High: 22-29.9    Extreme: 30 and above           Subjective: Post-colonoscopy, feeling good.  Requested double portions of meals but also really insistent on getting all extra fluid off prior to discharge,   Objective: Vitals:   04/03/23 1236 04/03/23 1600  BP: 135/73 100/72  Pulse: 60 61  Resp: 16 17  Temp: 97.9 F (36.6 C) 97.8 F (36.6 C)  SpO2: 98% 98%    Intake/Output Summary (Last 24 hours) at 04/03/2023 1818 Last data filed at 04/03/2023 1615 Gross per 24 hour  Intake 150 ml  Output 3200 ml  Net -3050 ml   Filed Weights   03/31/23 0500 04/01/23 0500 04/02/23 0500  Weight: (!) 141.5 kg (!) 140.6 kg (!) 141.3 kg    Exam:  General:  Appears calm and comfortable and is in NAD, on RA Eyes:  EOMI, normal lids, iris ENT:  grossly normal hearing, lips & tongue, mmm; Neck:  no LAD, masses or thyromegaly; no JVD Cardiovascular:  RRR, no m/r/g.  Respiratory:   CTA bilaterally with no wheezes/rales/rhonchi.  Normal respiratory effort. Abdomen:  soft, NT, ND Skin:  no rash or induration seen on limited exam Musculoskeletal:  s/p B AKA, mild B thigh edema Psychiatric:  grossly normal mood and affect, speech fluent and appropriate, AOx3 Neurologic:  CN 2-12 grossly  intact, moves all extremities in coordinated fashion  Data Reviewed: I have reviewed the patient's lab results since admission.  Pertinent labs for today include:   Normal CMP WBC 2.5 Hgb 11.7 INR 1.9    Family Communication: None present  Disposition: Status is: Inpatient Remains inpatient appropriate because: anasarca     Time spent: 50 minutes  Unresulted Labs (From admission, onward)     Start     Ordered   04/04/23 0500  CBC with Differential/Platelet  Tomorrow morning,   R       Question:  Specimen collection method  Answer:  Lab=Lab collect   04/03/23 1802   04/04/23 0500  Basic metabolic panel  Tomorrow morning,   R       Question:  Specimen collection method  Answer:  Lab=Lab collect   04/03/23  1802   03/25/23 0500  Protime-INR  Daily,   R      03/24/23 0913             Author: Jonah Blue, MD 04/03/2023 6:18 PM  For on call review [email or web address].

## 2023-04-03 NOTE — Hospital Course (Signed)
67yo with h/o stage IV prostate CA, recurrent VTE, HTN, PVD s/p B AKA, and pressure ulcers who presented on 1/18 with SOB, found to have recurrent DVT despite Eliquis and then Coumadin (no bridge so not failure)  He was unable to afford Lovenox as an outpatient.  During the hospitalization, he had volume overload and has been diuresed.  Also with ileus vs. Stool impaction that is being treated with aggressive bowel regimen.

## 2023-04-03 NOTE — Anesthesia Preprocedure Evaluation (Signed)
Anesthesia Evaluation  Patient identified by MRN, date of birth, ID band Patient awake  General Assessment Comment:Ileus, no n/v   Reviewed: Allergy & Precautions, NPO status , Patient's Chart, lab work & pertinent test results  History of Anesthesia Complications Negative for: history of anesthetic complications  Airway Mallampati: IV  TM Distance: >3 FB Neck ROM: full    Dental no notable dental hx.    Pulmonary shortness of breath (stable) and at rest, sleep apnea , PE   Pulmonary exam normal        Cardiovascular hypertension, On Medications +CHF and + DVT  Normal cardiovascular exam  ECHO 1/18 IMPRESSIONS     1. Left ventricular ejection fraction, by estimation, is 55 to 60%. The  left ventricle has normal function. Left ventricular endocardial border  not optimally defined to evaluate regional wall motion. Left ventricular  diastolic parameters are  indeterminate.   2. Right ventricular systolic function was not well visualized. The right  ventricular size is normal.   3. The mitral valve is normal in structure. No evidence of mitral valve  regurgitation. No evidence of mitral stenosis.   4. The aortic valve is normal in structure. Aortic valve regurgitation is  not visualized. No aortic stenosis is present.   5. The inferior vena cava is normal in size with greater than 50%  respiratory variability, suggesting right atrial pressure of 3 mmHg.   6. Ascending aorta measurements are within normal limits for age when  indexed to body surface area.     Neuro/Psych  PSYCHIATRIC DISORDERS  Depression     Neuromuscular disease    GI/Hepatic negative GI ROS, Neg liver ROS,,,  Endo/Other    Class 4 obesity  Renal/GU negative Renal ROS  negative genitourinary   Musculoskeletal   Abdominal   Peds  Hematology  (+) Blood dyscrasia, anemia   Anesthesia Other Findings Past Medical History: No date:  Depression 08/13/2011: History of blood clots No date: History of left knee replacement No date: HTN (hypertension) No date: Lymphedema     Comment:  lt leg-per pt No date: PE (pulmonary thromboembolism) (HCC) No date: Pressure injury of skin, unspecified injury stage,  unspecified location No date: S/P AKA (above knee amputation) bilateral (HCC)     Comment:  rt 11/24/07 and lt 01/11/17  Past Surgical History: No date: ACHILLES TENDON SURGERY; Left     Comment:  per pt 10/04/2015: bone clip removal; Left     Comment:  knee 11/2012: CARDIAC SURGERY     Comment:  cath.-per pt 07/28/2015: CERVICAL FUSION     Comment:  per pt No date: EPIGASTRIC HERNIA REPAIR     Comment:  per pt 11/02/2015: hematoma removal; Left     Comment:  knee No date: HIP SURGERY; Right     Comment:  x8 per pt 11/14/2022: IR IMAGING GUIDED PORT INSERTION No date: KNEE FUSION; Right 09/11/2011: KNEE JOINT MANIPULATION; Left No date: KNEE SURGERY; Left     Comment:  x3 03/2017: leg stump; Left     Comment:  leg surgery-per pt 02/2015: NECK SURGERY     Comment:  plates-per pt No date: REPLACEMENT TOTAL KNEE; Right 07/02/2011: REPLACEMENT TOTAL KNEE; Left     Comment:  again in 07/10/2011 per pt No date: s/p of bilateral AKA; Right 10/20/2008: screen filter removal     Comment:  for embolism per pt No date: screen filter replacement     Comment:  x3 No date: SHOULDER SURGERY; Right  Comment:  x2-per pt 02/11/2015: SPINE SURGERY     Comment:  vertebra spacer-per pt No date: toe nail removal; Right     Comment:  per pt No date: VARICOSE VEIN SURGERY     Comment:  x5-per pt  BMI    Body Mass Index: 65.11 kg/m      Reproductive/Obstetrics negative OB ROS                              Anesthesia Physical Anesthesia Plan  ASA: 4  Anesthesia Plan: General   Post-op Pain Management: Minimal or no pain anticipated   Induction: Intravenous  PONV Risk Score and Plan:  1 and Propofol infusion and TIVA  Airway Management Planned: Natural Airway and Nasal Cannula  Additional Equipment:   Intra-op Plan:   Post-operative Plan:   Informed Consent: I have reviewed the patients History and Physical, chart, labs and discussed the procedure including the risks, benefits and alternatives for the proposed anesthesia with the patient or authorized representative who has indicated his/her understanding and acceptance.     Dental Advisory Given  Plan Discussed with: Anesthesiologist, CRNA and Surgeon  Anesthesia Plan Comments: (Patient consented for risks of anesthesia including but not limited to:  - adverse reactions to medications - risk of airway placement if required - damage to eyes, teeth, lips or other oral mucosa - nerve damage due to positioning  - sore throat or hoarseness - Damage to heart, brain, nerves, lungs, other parts of body or loss of life  Patient voiced understanding and assent.)         Anesthesia Quick Evaluation

## 2023-04-03 NOTE — Transfer of Care (Signed)
Immediate Anesthesia Transfer of Care Note  Patient: [name]  Procedure(s) Performed: COLONOSCOPY WITH PROPOFOL BIOPSY  Patient Location: PACU  Anesthesia Type:MAC  Level of Consciousness: drowsy  Airway & Oxygen Therapy: Patient Spontanous Breathing  Post-op Assessment: Report given to RN and Post -op Vital signs reviewed and stable  Post vital signs: Reviewed and stable  Last Vitals:  Vitals Value Taken Time  BP 97/71 04/03/23 1129  Temp    Pulse 60 04/03/23 1133  Resp 14 04/03/23 1133  SpO2 98 % 04/03/23 1133  Vitals shown include unfiled device data.  Last Pain:  Vitals:   04/03/23 1124  TempSrc:   PainSc: Asleep      Patients Stated Pain Goal: 0 (04/01/23 2000)  Complications: No notable events documented.

## 2023-04-03 NOTE — Anesthesia Procedure Notes (Signed)
Procedure Name: MAC Date/Time: 04/03/2023 10:42 AM  Performed by: Elisabeth Pigeon, CRNAOxygen Delivery Method: Supernova nasal CPAP

## 2023-04-04 DIAGNOSIS — R140 Abdominal distension (gaseous): Secondary | ICD-10-CM | POA: Diagnosis not present

## 2023-04-04 DIAGNOSIS — I82403 Acute embolism and thrombosis of unspecified deep veins of lower extremity, bilateral: Secondary | ICD-10-CM | POA: Diagnosis not present

## 2023-04-04 DIAGNOSIS — K5981 Ogilvie syndrome: Secondary | ICD-10-CM

## 2023-04-04 LAB — SURGICAL PATHOLOGY

## 2023-04-04 LAB — BASIC METABOLIC PANEL
Anion gap: 7 (ref 5–15)
BUN: 14 mg/dL (ref 8–23)
CO2: 28 mmol/L (ref 22–32)
Calcium: 9.6 mg/dL (ref 8.9–10.3)
Chloride: 104 mmol/L (ref 98–111)
Creatinine, Ser: 0.78 mg/dL (ref 0.61–1.24)
GFR, Estimated: >60 mL/min (ref >60)
Glucose, Bld: 91 mg/dL (ref 70–99)
Potassium: 3.4 mmol/L — ABNORMAL LOW (ref 3.5–5.1)
Sodium: 139 mmol/L (ref 135–145)

## 2023-04-04 LAB — CBC WITH DIFFERENTIAL/PLATELET
Abs Immature Granulocytes: 0.01 10*3/uL (ref 0.00–0.07)
Basophils Absolute: 0 10*3/uL (ref 0.0–0.1)
Basophils Relative: 1 %
Eosinophils Absolute: 0.3 10*3/uL (ref 0.0–0.5)
Eosinophils Relative: 10 %
HCT: 34.3 % — ABNORMAL LOW (ref 39.0–52.0)
Hemoglobin: 11.3 g/dL — ABNORMAL LOW (ref 13.0–17.0)
Immature Granulocytes: 0 %
Lymphocytes Relative: 23 %
Lymphs Abs: 0.6 10*3/uL — ABNORMAL LOW (ref 0.7–4.0)
MCH: 32.5 pg (ref 26.0–34.0)
MCHC: 32.9 g/dL (ref 30.0–36.0)
MCV: 98.6 fL (ref 80.0–100.0)
Monocytes Absolute: 0.4 10*3/uL (ref 0.1–1.0)
Monocytes Relative: 14 %
Neutro Abs: 1.4 10*3/uL — ABNORMAL LOW (ref 1.7–7.7)
Neutrophils Relative %: 52 %
Platelets: 241 10*3/uL (ref 150–400)
RBC: 3.48 MIL/uL — ABNORMAL LOW (ref 4.22–5.81)
RDW: 13.6 % (ref 11.5–15.5)
WBC: 2.7 10*3/uL — ABNORMAL LOW (ref 4.0–10.5)
nRBC: 0 % (ref 0.0–0.2)

## 2023-04-04 LAB — PROTIME-INR
INR: 2.2 — ABNORMAL HIGH (ref 0.8–1.2)
Prothrombin Time: 24.3 s — ABNORMAL HIGH (ref 11.4–15.2)

## 2023-04-04 LAB — GLUCOSE, CAPILLARY: Glucose-Capillary: 109 mg/dL — ABNORMAL HIGH (ref 70–99)

## 2023-04-04 MED ORDER — WARFARIN SODIUM 6 MG PO TABS
12.0000 mg | ORAL_TABLET | Freq: Once | ORAL | Status: AC
  Administered 2023-04-04: 12 mg via ORAL
  Filled 2023-04-04: qty 2

## 2023-04-04 NOTE — Consult Note (Signed)
PHARMACY - ANTICOAGULATION CONSULT NOTE  Pharmacy Consult for warfarin & enoxaparin (bridging) Indication: DVT  Allergies  Allergen Reactions   Bacitracin Hives   Vancomycin Other (See Comments) and Rash    Red man syndrome.   Ketorolac     Other Reaction(s): Kidney Disorder  Other reaction(s): Kidney Disorder   Spironolactone     Other Reaction(s): Unknown  Other reaction(s): Unknown   Patient Measurements: Height: 4\' 10"  (147.3 cm) Weight: (!) 139.6 kg (307 lb 12.2 oz) IBW/kg (Calculated) : 45.4  Vital Signs: Temp: 98.1 F (36.7 C) (01/30 0820) Temp Source: Oral (01/30 0323) BP: 118/69 (01/30 0820) Pulse Rate: 64 (01/30 0820)  Labs: Recent Labs    04/02/23 0515 04/03/23 0517 04/04/23 0520  HGB  --  11.7* 11.3*  HCT  --  35.8* 34.3*  PLT  --  234 241  LABPROT 20.1* 21.9* 24.3*  INR 1.7* 1.9* 2.2*  CREATININE 0.82 0.75 0.78   Estimated Creatinine Clearance: 105.3 mL/min (by C-G formula based on SCr of 0.78 mg/dL).  Medical History: Past Medical History:  Diagnosis Date   Depression    History of blood clots 08/13/2011   History of left knee replacement    HTN (hypertension)    Lymphedema    lt leg-per pt   PE (pulmonary thromboembolism) (HCC)    Pressure injury of skin, unspecified injury stage, unspecified location    S/P AKA (above knee amputation) bilateral (HCC)    rt 11/24/07 and lt 01/11/17   Warfarin INR 1.2 on 03/19/2023 after 2 weeks on warfarin 5 mg daily. Per note review - warfarin dose was changed to 7.5 mg three times a week and 5 mg on the other four days.  INR on admission 1.3 (03/23/2023). Therapeutic enoxaparin 1mg /kg q 12hr initiated as well.  Assessment: PMH significant of stage IV prostate cancer, recurrent DVT and PE, lymphedema, hypertension, status post AKA bilaterally, pressure ulcers. Patient presents with recurrent DVT despite eliquis use. INR sub-therapeutic on admission and sub-therapeutic on last outpatient check as well.  Pharmacy has been consulted to manage this patient's warfarin.   Goal of Therapy:  INR 2-3 Monitor platelets by anticoagulation protocol: Yes  Lovenox:  1mg /kg q12h  Warfarin:  Date INR Assessment Warfarin Dose  Comments  1/18 1.3 SUBtherapeutic --- Enoxaparin 1mg /kg q12 hrs  1/19 ---  7.5 mg Enoxaparin 1mg /kg q12 hrs  1/20 1.3 SUBtherapeutic 10 mg  Enoxaparin 1mg /kg q12 hrs  1/21 1.3  SUBtherapeutic 10 mg Enoxaparin 1mg /kg q12 hrs  1/22 1.3 SUBtherapeutic 15 mg  Enoxaparin 1mg /kg q12 hrs  1/23 1.3 SUBtherapeutic 15 mg Enoxaparin 1mg /kg q12 hrs  1/24 1.6 SUBtherapeutic 15 mg Enoxaparin 1mg /kg q12 hrs  1/25 2.1 Therapeutic x 1 7.5 mg Enoxaparin 1mg /kg q12 hrs  1/26 2.1 Therapeutic x 2 7.5mg  D/C enoxaparin  1/27 1.5 SUBtherapeutic 15 mg Resume enoxaparin  1/28 1.7 SUBtherapeutic 15 mg enoxaparin  1/29 1.9 SUBtherapeutic 15 mg enoxaparin  1/30 2.2 therapeutic                 Plan:  INR therapeutic. Will d/c Lovenox Will order Warfarin 12 mg po x 1  today. Expected need warfarin 10-12mg  per day to keep INR therapeutic (outpatient warfarin dose was NOT stable prior to admission). No new drug-drug interactions noted Daily INR for now CBC stable; monitor at least every 72 hours   Bari Mantis PharmD Clinical Pharmacist 04/04/2023

## 2023-04-04 NOTE — Progress Notes (Signed)
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  During the hospitalization, he had volume overload and has been diuresed.  Also with ileus vs. Stool impaction that is being treated with aggressive bowel regimen.  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 Noted recent for similar issues January 2 to January 6 and discharged on Lovenox Was unable to afford Lovenox and was transitioned to Coumadin in outpt setting without the bridge, therefore was pro-coaguable for a time INR remained subtherapeutic at 1.3 for several days, was briefly in range at 2.1 x 2 days, now subtherapeutic again Chest CT is negative for PE Pharmacy is managing anticoagulation Has been on Lovenox bridge until INR back at goal Coumadin dosing per pharmacy  Daily INR's  INR is at goal today   Ileus CT abd/pelvis w/o contrast on 1/21 - no acute findings but showed moderate stool burden in the colon.   GI pathogen panel is negative Treated with aggressive bowel regimen Consulted general surgery for input - ordered enema, given 1/25.  Signed off. 1/27 GoLytely prep and SMOD enema Avoid anti-diarrheals Continue lactulose 20 g BID prn, Senna-S BID Underwent colonoscopy 1/29 and had large bowel movement at time of scope without significant findings from scope  itself Inflammation noted in sigmoid colon, biopsied  Lots of GI rumbling today, per patient   Hypokalemia  On aggressive replacement daily with diuresis Due to diuresis and also GI losses    Hypomagnesemia Supplement potassium and magnesium PRN   Acute on chronic diastolic CHF/Anasarca, Severe peripheral edema  Progressive shortness of breath and generalized swelling in the setting of active malignancy and recurrent DVT Pt was severely fluid overloaded, with increased edema in both thighs >> scrotum and trunk Improving slowly 2D echo shows an LVEF of 55 - 60%. Normal RV size and function CTA negative for pulmonary emboli He continues to have progressive peripheral edema Continue IV Lasix 40 mg BID  Not much effect with IV albumin preceding lasix when tried Resumed Lisinopril at 10mg  daily Strict Io's, daily weights Monitor renal function, electrolytes He has been eating double portions for meals; changed to heart healthy and fluid-restricted diet Weight on 1/18 was 136.1 kg, up to 154.4 on 1/22, back down to 139.6 today Anticipate he will be sufficiently diuresed tomorrow to transition to PO Lasix and dc to home   Prostate cancer metastatic to bone  Baseline stage 4 metastatic prostate with bone involvement  Followed by 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 Chronic due to poor ambulation Wound care consulted, appreciate input   Anxiety/Depression Continue Bupropion   Morbid Obesity Body mass index is 65.11 kg/m.Marland Kitchen  Weight loss  should be encouraged Outpatient PCP/bariatric medicine f/u encouraged  Patient is s/p bilateral AKA and wheelchair-bound but fully independent at home  PT/OT to follow for maintenance of strength for independent  transfers He is recommended for home PT/OT at the time of discharge   Ascending thoracic aneurysm Measures 5cm Patient aware of aneurysm but has not seen vascular surgery in years Appreciate vascular surgery input Recommends to follow-up with CT surgery as an outpatient    Contact dermatitis  At sites of adhesives (cardiac leads, prior IV site) Hydrocortisone cream PRN   Stage III pressure injury Pressure Injury 03/23/23 Ischial tuberosity Left Stage 3 -  Full thickness tissue loss. Subcutaneous fat may be visible but bone, tendon or muscle are NOT exposed. 1 cm x 1 cm x 0.1 cm 50% pink moist 50% yellow (Active)  03/23/23   Location: Ischial tuberosity  Location Orientation: Left  Staging: Stage 3 -  Full thickness tissue loss. Subcutaneous fat may be visible but bone, tendon or muscle are NOT exposed.  Wound Description (Comments): 1 cm x 1 cm x 0.1 cm 50% pink moist 50% yellow  Present on Admission: Yes     Pressure Injury 03/26/23 Buttocks Left;Lateral Stage 2 -  Partial thickness loss of dermis presenting as a shallow open injury with a red, pink wound bed without slough. (Active)  03/26/23 0000  Location: Buttocks  Location Orientation: Left;Lateral  Staging: Stage 2 -  Partial thickness loss of dermis presenting as a shallow open injury with a red, pink wound bed without slough.  Wound Description (Comments):   Present on Admission: Yes        Consultants: Vascular surgery Surgery GI PT OT TOC team   Procedures: DVT US 1/18 Echocardiogram 1/18   Antibiotics: None    30 Day Unplanned Readmission Risk Score    Flowsheet Row ED to Hosp-Admission (Current) from 03/23/2023 in [facility]  30 Day Unplanned Readmission Risk Score (%) 18.84 Filed at 04/04/2023 0400       This score is the patient's risk of an unplanned readmission within 30 days of being discharged (0 -100%). The score is based on dignosis, age, lab data,  medications, orders, and past utilization.   Low:  0-14.9   Medium: 15-21.9   High: 22-29.9   Extreme: 30 and above           Subjective: Lots of grumbling in his belly today.  No other issues.   Objective: Vitals:   04/04/23 0323 04/04/23 0820  BP: 103/68 118/69  Pulse: 60 64  Resp: 18 18  Temp: 98.7 F (37.1 C) 98.1 F (36.7 C)  SpO2: 98% 100%    Intake/Output Summary (Last 24 hours) at 04/04/2023 1630 Last data filed at 04/04/2023 0610 Gross per 24 hour  Intake 225 ml  Output 1480 ml  Net -1255 ml   Filed Weights   04/01/23 0500 04/02/23 0500 04/04/23 0320  Weight: (!) 140.6 kg (!) 141.3 kg (!) 139.6 kg    Exam:  General:  Appears calm and comfortable and is in NAD, on RA Eyes:  EOMI, normal lids, iris ENT:  grossly normal hearing, lips & tongue, mmm; Neck:  no LAD, masses or thyromegaly; no JVD Cardiovascular:  RRR, no m/r/g.  Respiratory:   CTA bilaterally with no wheezes/rales/rhonchi.  Normal respiratory effort. Abdomen:  soft, NT, ND Skin:  no rash or induration seen on limited exam Musculoskeletal:  s/p B AKA, mild B thigh edema Psychiatric:  grossly normal mood and affect, speech fluent and appropriate, AOx3 Neurologic:  CN 2-12 grossly intact, moves all extremities in coordinated fashion  Data Reviewed: I have reviewed the patient's lab results since admission.  Pertinent labs for today include:   K+ 3.4 WBC 2.7 Hgb 11.3 INR 2.2    Family Communication: None present  Disposition: Status is: Inpatient Remains inpatient appropriate because: ongoing diuresis, likely home 1/31     Time spent: 50 minutes  Unresulted Labs (From admission, onward)     Start     Ordered   04/05/23 0500  CBC with Differential/Platelet  Tomorrow morning,   R       Question:  Specimen collection method  Answer:  Lab=Lab collect   04/04/23 1630   04/05/23 0500  Basic metabolic panel  Tomorrow morning,   R       Question:  Specimen collection method  Answer:   Lab=Lab collect   04/04/23 1630   03/25/23 0500  Protime-INR  Daily,   R      03/24/23 0913             Author: Jonah Blue, MD 04/04/2023 4:30 PM  For on call review [email or web address].

## 2023-04-04 NOTE — Anesthesia Postprocedure Evaluation (Signed)
Anesthesia Post Note  Patient: [name]  Procedure(s) Performed: COLONOSCOPY WITH PROPOFOL BIOPSY  Patient location during evaluation: Endoscopy Anesthesia Type: General Level of consciousness: awake and alert Pain management: pain level controlled Vital Signs Assessment: post-procedure vital signs reviewed and stable Respiratory status: spontaneous breathing, nonlabored ventilation, respiratory function stable and patient connected to nasal cannula oxygen Cardiovascular status: blood pressure returned to baseline and stable Postop Assessment: no apparent nausea or vomiting Anesthetic complications: no   No notable events documented.   Last Vitals:  Vitals:   04/03/23 2025 04/04/23 0323  BP: (!) 95/57 103/68  Pulse: 62 60  Resp: 18 18  Temp: 36.7 C 37.1 C  SpO2: 96% 98%    Last Pain:  Vitals:   04/04/23 0323  TempSrc: Oral  PainSc:                  [name]

## 2023-04-04 NOTE — Progress Notes (Signed)
[name], MD [facility]   [address], [address] [place], [place] [address] Phone: [phone] Fax : [phone]   Subjective: The patient reports that he has had no further stooling but his abdomen is less distended today than it was previously.  He reports that he has been told that if everything is going well he may be discharged home tomorrow.   Objective: Vital signs in last 24 hours: Vitals:   04/03/23 2025 04/04/23 0320 04/04/23 0323 04/04/23 0820  BP: (!) 95/57  103/68 118/69  Pulse: 62  60 64  Resp: 18  18 18   Temp: 98 F (36.7 C)  98.7 F (37.1 C) 98.1 F (36.7 C)  TempSrc: Oral  Oral   SpO2: 96%  98% 100%  Weight:  (!) 139.6 kg    Height:       Weight change:   Intake/Output Summary (Last 24 hours) at 04/04/2023 1135 Last data filed at 04/04/2023 5784 Gross per 24 hour  Intake 225 ml  Output 2080 ml  Net -1855 ml     Exam: Heart:: Regular rate and rhythm or without murmur or extra heart sounds Lungs: normal and clear to auscultation and percussion Abdomen: Distended tympanic without any rebound or guarding distention less than yesterday    Lab Results: @LABTEST2 @ Micro Results: Recent Results (from the past 240 hours)  Gastrointestinal Panel by PCR , Stool     Status: None   Collection Time: 03/26/23  8:45 AM   Specimen: Stool  Result Value Ref Range Status   Campylobacter species NOT DETECTED NOT DETECTED Final   Plesimonas shigelloides NOT DETECTED NOT DETECTED Final   Salmonella species NOT DETECTED NOT DETECTED Final   Yersinia enterocolitica NOT DETECTED NOT DETECTED Final   Vibrio species NOT DETECTED NOT DETECTED Final   Vibrio cholerae NOT DETECTED NOT DETECTED Final   Enteroaggregative E coli (EAEC) NOT DETECTED NOT DETECTED Final   Enteropathogenic E coli (EPEC) NOT DETECTED NOT DETECTED Final   Enterotoxigenic E coli (ETEC) NOT DETECTED NOT DETECTED Final   Shiga like toxin producing E coli (STEC) NOT DETECTED NOT DETECTED Final    Shigella/Enteroinvasive E coli (EIEC) NOT DETECTED NOT DETECTED Final   Cryptosporidium NOT DETECTED NOT DETECTED Final   Cyclospora cayetanensis NOT DETECTED NOT DETECTED Final   Entamoeba histolytica NOT DETECTED NOT DETECTED Final   Giardia lamblia NOT DETECTED NOT DETECTED Final   Adenovirus F40/41 NOT DETECTED NOT DETECTED Final   Astrovirus NOT DETECTED NOT DETECTED Final   Norovirus GI/GII NOT DETECTED NOT DETECTED Final   Rotavirus A NOT DETECTED NOT DETECTED Final   Sapovirus (I, II, IV, and V) NOT DETECTED NOT DETECTED Final    Comment: Performed at [facility], [address], [place], [place] [address]   Studies/Results: DG Abd 1 View Result Date: 04/03/2023 CLINICAL DATA:  Ileus. EXAM: ABDOMEN - 1 VIEW COMPARISON:  March 30, 2023. FINDINGS: Stable probable large bowel dilatation. No definite small bowel dilatation is noted. No abnormal calcifications. IMPRESSION: Stable probable large bowel dilatation is noted suggesting ileus. Electronically Signed   By: Lupita Raider M.D.   On: 04/03/2023 12:54   Medications: I have reviewed the patient's current medications. Scheduled Meds:  buPROPion  300 mg Oral Daily   Chlorhexidine Gluconate Cloth  6 each Topical Daily   enoxaparin (LOVENOX) injection  1 mg/kg Subcutaneous Q12H   furosemide  40 mg Intravenous Q12H   gabapentin  600 mg Oral TID   Gerhardt's butt  cream   Topical TID   leptospermum manuka honey  1 Application Topical Daily   lisinopril  10 mg Oral Daily   polyethylene glycol  17 g Oral BID   psyllium  1 packet Oral Daily   senna-docusate  1 tablet Oral BID   sodium chloride flush  3 mL Intravenous Q12H   Warfarin - Pharmacist Dosing Inpatient   Does not apply q1600   Continuous Infusions: PRN Meds:.albuterol, diazepam, hydrocortisone cream, iohexol, lactulose, LORazepam, ondansetron **OR** ondansetron (ZOFRAN) IV, oxyCODONE-acetaminophen, sodium chloride flush, traZODone   Assessment: Principal  Problem:   DVT, lower extremity, recurrent (HCC) Active Problems:   Prostate cancer metastatic to bone [facility])   Essential hypertension   Acute DVT (deep venous thrombosis) (HCC)   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   Ileus (HCC)   Anasarca    Plan: The patient has been told that if his abdomen should fill up with air again he may need a rectal tube.  He has also been told about a possible colostomy that he states he will not do.  The patient has also been encouraged to lose weight.  Nothing further to do from a GI point of view.  I will sign off.  Please call if any further GI concerns or questions.  We would like to thank you for the opportunity to participate in the care of [name].    LOS: 10 days   [name], MD.FACG 04/04/2023, 11:35 AM Pager [phone] 7am-5pm  Check AMION for 5pm -7am coverage and on weekends

## 2023-04-05 ENCOUNTER — Telehealth: Payer: Self-pay | Admitting: Internal Medicine

## 2023-04-05 ENCOUNTER — Encounter: Payer: Self-pay | Admitting: Internal Medicine

## 2023-04-05 ENCOUNTER — Other Ambulatory Visit: Payer: Medicare PPO

## 2023-04-05 ENCOUNTER — Other Ambulatory Visit: Payer: Self-pay

## 2023-04-05 ENCOUNTER — Ambulatory Visit: Payer: Medicare PPO | Admitting: Internal Medicine

## 2023-04-05 ENCOUNTER — Inpatient Hospital Stay: Payer: Medicare PPO | Admitting: Internal Medicine

## 2023-04-05 DIAGNOSIS — I82403 Acute embolism and thrombosis of unspecified deep veins of lower extremity, bilateral: Secondary | ICD-10-CM | POA: Diagnosis not present

## 2023-04-05 LAB — BASIC METABOLIC PANEL
Anion gap: 10 (ref 5–15)
BUN: 15 mg/dL (ref 8–23)
CO2: 28 mmol/L (ref 22–32)
Calcium: 9.5 mg/dL (ref 8.9–10.3)
Chloride: 102 mmol/L (ref 98–111)
Creatinine, Ser: 0.98 mg/dL (ref 0.61–1.24)
GFR, Estimated: >60 mL/min (ref >60)
Glucose, Bld: 117 mg/dL — ABNORMAL HIGH (ref 70–99)
Potassium: 3 mmol/L — ABNORMAL LOW (ref 3.5–5.1)
Sodium: 140 mmol/L (ref 135–145)

## 2023-04-05 LAB — CBC WITH DIFFERENTIAL/PLATELET
Abs Immature Granulocytes: 0.01 10*3/uL (ref 0.00–0.07)
Basophils Absolute: 0 10*3/uL (ref 0.0–0.1)
Basophils Relative: 1 %
Eosinophils Absolute: 0.2 10*3/uL (ref 0.0–0.5)
Eosinophils Relative: 7 %
HCT: 36.2 % — ABNORMAL LOW (ref 39.0–52.0)
Hemoglobin: 11.9 g/dL — ABNORMAL LOW (ref 13.0–17.0)
Immature Granulocytes: 0 %
Lymphocytes Relative: 15 %
Lymphs Abs: 0.5 10*3/uL — ABNORMAL LOW (ref 0.7–4.0)
MCH: 32.4 pg (ref 26.0–34.0)
MCHC: 32.9 g/dL (ref 30.0–36.0)
MCV: 98.6 fL (ref 80.0–100.0)
Monocytes Absolute: 0.3 10*3/uL (ref 0.1–1.0)
Monocytes Relative: 10 %
Neutro Abs: 2.4 10*3/uL (ref 1.7–7.7)
Neutrophils Relative %: 67 %
Platelets: 263 10*3/uL (ref 150–400)
RBC: 3.67 MIL/uL — ABNORMAL LOW (ref 4.22–5.81)
RDW: 13.6 % (ref 11.5–15.5)
WBC: 3.5 10*3/uL — ABNORMAL LOW (ref 4.0–10.5)
nRBC: 0 % (ref 0.0–0.2)

## 2023-04-05 LAB — PROTIME-INR
INR: 2.2 — ABNORMAL HIGH (ref 0.8–1.2)
Prothrombin Time: 24.5 s — ABNORMAL HIGH (ref 11.4–15.2)

## 2023-04-05 MED ORDER — LACTULOSE 10 GM/15ML PO SOLN
20.0000 g | Freq: Two times a day (BID) | ORAL | 0 refills | Status: DC | PRN
  Filled 2023-04-05: qty 236, 4d supply, fill #0

## 2023-04-05 MED ORDER — PSYLLIUM 95 % PO PACK
1.0000 | PACK | Freq: Every day | ORAL | 0 refills | Status: DC
  Filled 2023-04-05: qty 30, 30d supply, fill #0

## 2023-04-05 MED ORDER — WARFARIN SODIUM 6 MG PO TABS
12.0000 mg | ORAL_TABLET | Freq: Once | ORAL | Status: AC
Start: 2023-04-05 — End: 2023-04-05
  Administered 2023-04-05: 12 mg via ORAL
  Filled 2023-04-05: qty 2

## 2023-04-05 MED ORDER — POLYETHYLENE GLYCOL 3350 17 GM/SCOOP PO POWD
17.0000 g | Freq: Two times a day (BID) | ORAL | 0 refills | Status: DC
  Filled 2023-04-05: qty 238, 7d supply, fill #0

## 2023-04-05 MED ORDER — FUROSEMIDE 20 MG PO TABS
20.0000 mg | ORAL_TABLET | Freq: Two times a day (BID) | ORAL | 0 refills | Status: DC
  Filled 2023-04-05: qty 60, 30d supply, fill #0

## 2023-04-05 MED ORDER — LISINOPRIL 10 MG PO TABS
10.0000 mg | ORAL_TABLET | Freq: Every day | ORAL | 0 refills | Status: DC
  Filled 2023-04-05: qty 30, 30d supply, fill #0

## 2023-04-05 MED ORDER — POTASSIUM CHLORIDE CRYS ER 20 MEQ PO TBCR
40.0000 meq | EXTENDED_RELEASE_TABLET | ORAL | Status: AC
  Administered 2023-04-05 (×2): 40 meq via ORAL
  Filled 2023-04-05 (×2): qty 2

## 2023-04-05 MED ORDER — HEPARIN SOD (PORK) LOCK FLUSH 100 UNIT/ML IV SOLN
500.0000 [IU] | INTRAVENOUS | Status: AC | PRN
  Administered 2023-04-05: 500 [IU]

## 2023-04-05 MED ORDER — WARFARIN SODIUM 6 MG PO TABS
12.0000 mg | ORAL_TABLET | Freq: Once | ORAL | Status: DC
Start: 2023-04-05 — End: 2023-04-05
  Filled 2023-04-05: qty 2

## 2023-04-05 NOTE — Telephone Encounter (Signed)
Pt was scheduled to see Dr.Vaslow today 1/31. Pt is in the hospital . I have canceled and noted the appt for today and left a vm for pt that I canceled the appt due to pt being in the hospital and said to call back when he is feeling better to r/s his appt. Phone number to r/s provided.

## 2023-04-05 NOTE — Discharge Summary (Signed)
Physician Discharge Summary   Patient: [name] MRN: [id number] DOB: Mar 26, 1955  Admit date:     03/23/2023  Discharge date: 04/05/23  Discharge Physician: Jonah Blue   PCP: Dione Housekeeper, MD   Recommendations at discharge:   You are being discharged home with home health PT, OT, and nursing for wound care Continue Coumadin Maintain good bowel regimen; next steps would be possible rectal tube, colostomy Discuss goals of care with palliative care at the cancer center Follow up with Dr. Zada Finders in 1-2 weeks Follow up with oncology as scheduled  Discharge Diagnoses: Principal Problem:   DVT, lower extremity, recurrent (HCC) Active Problems:   Prostate cancer metastatic to bone [facility])   Essential hypertension   Acute DVT (deep venous thrombosis) (HCC)   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   Ileus [facility])   Anasarca   Ogilvie syndrome    Hospital Course: 68yo with h/o stage IV prostate CA, recurrent VTE, HTN, PVD s/p B AKA, and pressure ulcers who presented on 1/18 with SOB, found to have recurrent DVT despite Eliquis and then Coumadin (no bridge so not failure)  He was unable to afford Lovenox as an outpatient.  During the hospitalization, he had volume overload and has been diuresed.  Also with ileus vs. Stool impaction that is being treated with aggressive bowel regimen.  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 Noted recent for similar issues January 2 to January 6 and discharged on Lovenox Was unable to afford Lovenox and was transitioned to Coumadin in outpt setting without the bridge, therefore was pro-coaguable for a time INR remained subtherapeutic at 1.3 for  several days, was briefly in range at 2.1 x 2 days, now subtherapeutic again Chest CT is negative for PE Pharmacy is managing anticoagulation Has been on Lovenox bridge until INR back at goal Coumadin dosing per pharmacy  INR is at goal    Ogilvie's syndrome CT abd/pelvis w/o contrast on 1/21 - no acute findings but showed moderate stool burden in the colon.   GI pathogen panel is negative Treated with aggressive bowel regimen Consulted general surgery for input - ordered enema, given 1/25.  Signed off. 1/27 GoLytely prep and SMOD enema Avoid anti-diarrheals Continue lactulose 20 g BID prn, Senna-S BID Underwent colonoscopy 1/29 and had large bowel movement at time of scope without significant findings from scope itself Inflammation noted in sigmoid colon, biopsied with significant findings BM this AM He would decline both rectal tube and colostomy if needed currently He is feeling better and wants to go home today   Hypokalemia  On aggressive replacement daily with diuresis Due to diuresis and also GI losses    Hypomagnesemia Supplement potassium and magnesium PRN   Acute on chronic diastolic CHF/Anasarca, Severe peripheral edema  Progressive shortness of breath and generalized swelling in the setting of active malignancy and recurrent DVT Pt was severely fluid overloaded, with increased edema in both thighs >> scrotum and trunk Improving slowly 2D echo shows an LVEF of 55 - 60%. Normal RV size and function CTA negative for pulmonary emboli He continues to have progressive peripheral edema Continue IV Lasix 40 mg BID  Not much effect with IV albumin preceding lasix when tried Resumed Lisinopril at 10mg  daily Strict  Io's, daily weights Monitor renal function, electrolytes He has been eating double portions for meals; changed to heart healthy and fluid-restricted diet Weight on 1/18 was 136.1 kg, up to 154.4 on 1/22, back down to 139.6 today He sufficiently diuresed to  transition to PO Lasix and dc to home   Prostate cancer metastatic to bone  Baseline stage 4 metastatic prostate with bone involvement  Followed by 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 Chronic due to poor ambulation Wound care consulted, appreciate input   Anxiety/Depression Continue Bupropion   Morbid Obesity Body mass index is 65.11 kg/m.Marland Kitchen  Weight loss should be encouraged Outpatient PCP/bariatric medicine f/u encouraged  Patient is s/p bilateral AKA and wheelchair-bound but fully independent at home  PT/OT to follow for maintenance of strength for independent transfers He is recommended for home PT/OT at the time of discharge   Ascending thoracic aneurysm Measures 5cm Patient aware of aneurysm but has not seen vascular surgery in years Appreciate vascular surgery input Recommends to follow-up with CT surgery as an outpatient    Contact dermatitis  At sites of adhesives (cardiac leads, prior IV site) Hydrocortisone cream PRN   Stage III pressure injury Pressure Injury 03/23/23 Ischial tuberosity Left Stage 3 -  Full thickness tissue loss. Subcutaneous fat may be visible but bone, tendon or muscle are NOT exposed. 1 cm x 1 cm x 0.1 cm 50% pink moist 50% yellow (Active)  03/23/23   Location: Ischial tuberosity  Location Orientation: Left  Staging: Stage 3 -  Full thickness tissue loss. Subcutaneous fat may be visible but bone, tendon or muscle are NOT exposed.  Wound Description (Comments): 1 cm x 1 cm x 0.1 cm 50% pink moist 50% yellow  Present on Admission: Yes     Pressure Injury 03/26/23 Buttocks Left;Lateral Stage 2 -  Partial thickness loss of dermis presenting as a shallow open injury with a red, pink wound bed without slough. (Active)   03/26/23 0000  Location: Buttocks  Location Orientation: Left;Lateral  Staging: Stage 2 -  Partial thickness loss of dermis presenting as a shallow open injury with a red, pink wound bed without slough.  Wound Description (Comments):   Present on Admission: Yes        Consultants: Vascular surgery Surgery GI PT OT TOC team   Procedures: DVT US 1/18 Echocardiogram 1/18   Antibiotics: None   Pain control - [facility] Controlled Substance Reporting System database was reviewed. and patient was instructed, not to drive, operate heavy machinery, perform activities at heights, swimming or participation in water activities or provide baby-sitting services while on Pain, Sleep and Anxiety Medications; until their outpatient Physician has advised to do so again. Also recommended to not to take more than prescribed Pain, Sleep and Anxiety Medications.   Disposition: Home Diet recommendation:  Regular diet DISCHARGE MEDICATION: Allergies as of 04/05/2023       Reactions   Bacitracin Hives   Vancomycin Other (See Comments), Rash   Red man syndrome.   Ketorolac    Other Reaction(s): Kidney Disorder Other reaction(s): Kidney Disorder   Spironolactone    Other Reaction(s): Unknown Other reaction(s): Unknown        Medication List     PAUSE taking these medications    darolutamide 300 MG tablet  Wait to take this until your doctor or other care provider tells you to start again. Commonly known as: NUBEQA Take 2 tablets (600 mg total) by mouth 2 (two) times daily with a meal.       TAKE these medications    buPROPion 300 MG 24 hr tablet Commonly known as: WELLBUTRIN XL Take 300 mg by mouth daily.   diphenoxylate-atropine 2.5-0.025 MG tablet Commonly known as: LOMOTIL Take 1 tablet by mouth 4 (four) times daily as needed for diarrhea or loose stools.   furosemide 20 MG tablet Commonly known as: Lasix Take 1 tablet (20 mg total) by mouth 2 (two) times daily.    gabapentin 300 MG capsule Commonly known as: Neurontin Take 2 capsules (600 mg total) by mouth 3 (three) times daily.   lactulose 10 GM/15ML solution Commonly known as: CHRONULAC Take 30 mLs (20 g total) by mouth 2 (two) times daily as needed for mild constipation.   leptospermum manuka honey Pste paste Apply 1 Application topically daily.   lidocaine-prilocaine cream Commonly known as: EMLA Apply 1 Application topically as needed.   lisinopril 10 MG tablet Commonly known as: ZESTRIL Take 1 tablet (10 mg total) by mouth daily. Start taking on: April 06, 2023 What changed:  medication strength how much to take when to take this   ondansetron 8 MG tablet Commonly known as: ZOFRAN Take 1 tablet (8 mg total) by mouth every 8 (eight) hours as needed for nausea or vomiting.   oxyCODONE-acetaminophen 5-325 MG tablet Commonly known as: PERCOCET/ROXICET Take 1 tablet by mouth every 12 (twelve) hours as needed for moderate pain (pain score 4-6).   polyethylene glycol 17 g packet Commonly known as: MIRALAX / GLYCOLAX Take 17 g by mouth 2 (two) times daily.   psyllium 95 % Pack Commonly known as: HYDROCIL/METAMUCIL Take 1 packet by mouth daily. Start taking on: April 06, 2023   senna-docusate 8.6-50 MG tablet Commonly known as: Senokot-S Take 2 tablets by mouth 2 (two) times daily as needed for mild constipation.   warfarin 5 MG tablet Commonly known as: COUMADIN Take 5-7.5 mg by mouth daily.               Discharge Care Instructions  (From admission, onward)           Start     Ordered   04/05/23 0000  Discharge wound care:       Comments: Cleanse sacrum/scrotum/inner thighs with Vashe wound cleanser, apply Gerhardt's Butt Cream to entire area 3 times a day and prn soiling.  Sprinkle over Gerhardt's with floor stock antifungal powder (microguard green and white label) for additional drying and antifungal effects.   Clean L ischial PI with Vashe wound  cleanser, apply Medihoney to wound bed daily, cover with dry gauze and silicone foam.  Change foam q3 days and prn soiling   04/05/23 1045            Discharge Exam:   Subjective: Stomach still rumbling, mildly more distended today but he had a BM this AM and wants to go home.   Objective: Vitals:   04/05/23 0352 04/05/23 0754  BP: 116/71 121/67  Pulse: (!) 59 64  Resp: 20 18  Temp: 97.6 F (36.4 C) 98.2 F (36.8 C)  SpO2: 99% 98%    Intake/Output Summary (Last 24 hours) at 04/05/2023 1045 Last data filed at 04/05/2023 0745 Gross per 24 hour  Intake 360 ml  Output 1900 ml  Net -1540 ml  Filed Weights   04/02/23 0500 04/04/23 0320 04/05/23 0401  Weight: (!) 141.3 kg (!) 139.6 kg (!) 136.2 kg    Exam:  General:  Appears calm and comfortable and is in NAD, on RA Eyes:  EOMI, normal lids, iris ENT:  grossly normal hearing, lips & tongue, mmm; Neck:  no LAD, masses or thyromegaly; no JVD Cardiovascular:  RRR, no m/r/g.  Respiratory:   CTA bilaterally with no wheezes/rales/rhonchi.  Normal respiratory effort. Abdomen:  soft, NT, somewhat distended (limited exam due to habitus) Skin:  no rash or induration seen on limited exam Musculoskeletal:  s/p B AKA, resolved thigh edema Psychiatric:  grossly normal mood and affect, speech fluent and appropriate, AOx3 Neurologic:  CN 2-12 grossly intact, moves all extremities in coordinated fashion  Data Reviewed: I have reviewed the patient's lab results since admission.  Pertinent labs for today include:  K+ 3.0 Glucose 117 WBC 3.5 Hgb 11.9 INR 2.2    Condition at discharge: good  The results of significant diagnostics from this hospitalization (including imaging, microbiology, ancillary and laboratory) are listed below for reference.   Imaging Studies: DG Abd 1 View Result Date: 04/03/2023 CLINICAL DATA:  Ileus. EXAM: ABDOMEN - 1 VIEW COMPARISON:  March 30, 2023. FINDINGS: Stable probable large bowel dilatation.  No definite small bowel dilatation is noted. No abnormal calcifications. IMPRESSION: Stable probable large bowel dilatation is noted suggesting ileus. Electronically Signed   By: Lupita Raider M.D.   On: 04/03/2023 12:54   DG Abd 1 View Result Date: 03/30/2023 CLINICAL DATA:  Abdominal distension EXAM: ABDOMEN - 1 VIEW COMPARISON:  03/28/2023 FINDINGS: Scattered large and small bowel gas is noted. The degree of sigmoid gaseous dilatation is relatively stable. No obstructive changes are seen. No free air is noted. Mild retained fecal material is noted in the right colon. This is stable from the prior exam. IMPRESSION: Stable appearance when compare with the prior exam. Electronically Signed   By: Alcide Clever M.D.   On: 03/30/2023 20:04   DG Chest Port 1 View Result Date: 03/29/2023 CLINICAL DATA:  Dyspnea EXAM: PORTABLE CHEST - 1 VIEW COMPARISON:  03/23/2023 FINDINGS: Improved aeration. No focal infiltrate. Stable right IJ port catheter to the distal SVC. Heart size and mediastinal contours are within normal limits. Aortic Atherosclerosis (ICD10-170.0). No effusion. Cervical fixation hardware partially visualized. IMPRESSION: No acute cardiopulmonary disease. Electronically Signed   By: Corlis Leak M.D.   On: 03/29/2023 14:54   DG Abd 1 View Result Date: 03/28/2023 CLINICAL DATA:  Abdominal distension. EXAM: ABDOMEN - 1 VIEW COMPARISON:  CT abdomen pelvis dated 03/26/2023. FINDINGS: Evaluation is limited due to body habitus. Persistent air distention of the colon may represent colonic ileus. The sigmoid colon measures up to 13 cm in length which appears more distended since the CT. Moderate stool noted throughout the colon proximal to the distended segment as seen on the prior CT. No definite free air. No acute osseous pathology. IMPRESSION: Persistent, and slightly worsened, air distention of the colon may represent colonic ileus. Electronically Signed   By: Elgie Collard M.D.   On: 03/28/2023 13:53    CT ABDOMEN PELVIS WO CONTRAST Result Date: 03/26/2023 CLINICAL DATA:  Abdominal pain. Prostate cancer with osseous metastasis. EXAM: CT ABDOMEN AND PELVIS WITHOUT CONTRAST TECHNIQUE: Multidetector CT imaging of the abdomen and pelvis was performed following the standard protocol without IV contrast. RADIATION DOSE REDUCTION: This exam was performed according to the departmental dose-optimization program which includes automated exposure control,  adjustment of the mA and/or kV according to patient size and/or use of iterative reconstruction technique. COMPARISON:  CT chest abdomen pelvis dated 09/30/2022. FINDINGS: Evaluation of this exam is limited in the absence of intravenous contrast. Evaluation is also limited due to streak artifact caused by patient's arms and body habitus. Lower chest: The visualized lung bases are clear. The tip of a central venous line seen at the cavoatrial junction. No intra-abdominal free air or free fluid. Hepatobiliary: The liver is unremarkable. No biliary dilatation. Multiple stones in the gallbladder. No pericholecystic fluid or evidence of acute cholecystitis by CT. Pancreas: Unremarkable. No pancreatic ductal dilatation or surrounding inflammatory changes. Spleen: Normal in size without focal abnormality. Adrenals/Urinary Tract: The adrenal glands are unremarkable. Similar appearance of left renal cyst and dilated upper pole collecting system, poorly evaluated on today's study. No stone. The right kidney is unremarkable. The visualized ureters and urinary bladder appear unremarkable. Stomach/Bowel: There is moderate stool throughout the colon. There is mild gaseous distension of the sigmoid colon. No evidence of obstruction. The appendix is not visualized with certainty. No inflammatory changes identified in the right lower quadrant. Vascular/Lymphatic: Mild aortoiliac atherosclerotic disease. The IVC is unremarkable. No portal venous gas. There is no adenopathy. Reproductive:  The prostate and seminal vesicles are grossly unremarkable. No pelvic mass Other: There is mild diffuse subcutaneous edema. Musculoskeletal: Extensive osseous sclerotic metastasis, relatively similar to prior CT. No acute osseous pathology. IMPRESSION: 1. No acute intra-abdominal or pelvic pathology. 2. Moderate colonic stool burden. No evidence of obstruction. 3. Cholelithiasis. 4. Extensive osseous sclerotic metastasis. 5.  Aortic Atherosclerosis (ICD10-I70.0). Electronically Signed   By: Elgie Collard M.D.   On: 03/26/2023 17:02   ECHOCARDIOGRAM COMPLETE Result Date: 03/23/2023    ECHOCARDIOGRAM REPORT   Patient Name:   Tandy Straw Date of Exam: 03/23/2023 Medical Rec #:  [id number]    Height:       58.0 in Accession #:    [id number]   Weight:       300.0 lb Date of Birth:  12-Aug-1955    BSA:          2.164 m Patient Age:    67 years     BP:           141/82 mmHg Patient Gender: M            HR:           77 bpm. Exam Location:  [facility] Procedure: 2D Echo, Intracardiac Opacification Agent, Cardiac Doppler and Color            Doppler Indications:     Cardiomegaly I51.7  History:         Patient has no prior history of Echocardiogram examinations.                  Risk Factors:Hypertension.  Sonographer:     Neysa Bonito Roar Referring Phys:  [id number] Francoise Schaumann [place]ON Diagnosing Phys: Armanda Magic MD IMPRESSIONS  1. Left ventricular ejection fraction, by estimation, is 55 to 60%. The left ventricle has normal function. Left ventricular endocardial border not optimally defined to evaluate regional wall motion. Left ventricular diastolic parameters are indeterminate.  2. Right ventricular systolic function was not well visualized. The right ventricular size is normal.  3. The mitral valve is normal in structure. No evidence of mitral valve regurgitation. No evidence of mitral stenosis.  4. The aortic valve is normal in structure. Aortic valve regurgitation is not visualized. No aortic stenosis is  present.  5. The inferior  vena cava is normal in size with greater than 50% respiratory variability, suggesting right atrial pressure of 3 mmHg.  6. Ascending aorta measurements are within normal limits for age when indexed to body surface area. FINDINGS  Left Ventricle: Left ventricular ejection fraction, by estimation, is 55 to 60%. The left ventricle has normal function. Left ventricular endocardial border not optimally defined to evaluate regional wall motion. Definity contrast agent was given IV to delineate the left ventricular endocardial borders. The left ventricular internal cavity size was normal in size. There is no left ventricular hypertrophy. Left ventricular diastolic parameters are indeterminate. Normal left ventricular filling pressure. Right Ventricle: The right ventricular size is normal. No increase in right ventricular wall thickness. Right ventricular systolic function was not well visualized. Left Atrium: Left atrial size was normal in size. Right Atrium: Right atrial size was normal in size. Pericardium: There is no evidence of pericardial effusion. Mitral Valve: The mitral valve is normal in structure. No evidence of mitral valve regurgitation. No evidence of mitral valve stenosis. MV peak gradient, 5.2 mmHg. The mean mitral valve gradient is 3.0 mmHg. Tricuspid Valve: The tricuspid valve is normal in structure. Tricuspid valve regurgitation is not demonstrated. No evidence of tricuspid stenosis. Aortic Valve: The aortic valve is normal in structure. Aortic valve regurgitation is not visualized. No aortic stenosis is present. Aortic valve mean gradient measures 5.0 mmHg. Aortic valve peak gradient measures 10.6 mmHg. Aortic valve area, by VTI measures 2.00 cm. Pulmonic Valve: The pulmonic valve was normal in structure. Pulmonic valve regurgitation is not visualized. No evidence of pulmonic stenosis. Aorta: The aortic root is normal in size and structure. Ascending aorta measurements are within normal limits for age  when indexed to body surface area. Venous: The inferior vena cava is normal in size with greater than 50% respiratory variability, suggesting right atrial pressure of 3 mmHg. IAS/Shunts: No atrial level shunt detected by color flow Doppler.  LEFT VENTRICLE PLAX 2D LVIDd:         5.10 cm   Diastology LVIDs:         3.60 cm   LV e' medial:    7.18 cm/s LV PW:         1.00 cm   LV E/e' medial:  17.8 LV IVS:        1.10 cm   LV e' lateral:   13.50 cm/s LVOT diam:     2.20 cm   LV E/e' lateral: 9.5 LV SV:         63 LV SV Index:   29 LVOT Area:     3.80 cm  RIGHT VENTRICLE RV Basal diam:  3.80 cm RV Mid diam:    3.80 cm RV S prime:     16.60 cm/s LEFT ATRIUM           Index LA diam:      4.20 cm 1.94 cm/m LA Vol (A4C): 59.1 ml 27.32 ml/m  AORTIC VALVE                    PULMONIC VALVE AV Area (Vmax):    2.31 cm     PV Vmax:        1.32 m/s AV Area (Vmean):   2.50 cm     PV Peak grad:   7.0 mmHg AV Area (VTI):     2.00 cm     RVOT Peak grad: 5 mmHg AV Vmax:  163.00 cm/s AV Vmean:          98.200 cm/s AV VTI:            0.313 m AV Peak Grad:      10.6 mmHg AV Mean Grad:      5.0 mmHg LVOT Vmax:         99.00 cm/s LVOT Vmean:        64.600 cm/s LVOT VTI:          0.165 m LVOT/AV VTI ratio: 0.53  AORTA Ao Root diam: 3.60 cm Ao Asc diam:  3.80 cm MITRAL VALVE MV Area (PHT): 4.74 cm     SHUNTS MV Area VTI:   2.31 cm     Systemic VTI:  0.16 m MV Peak grad:  5.2 mmHg     Systemic Diam: 2.20 cm MV Mean grad:  3.0 mmHg MV Vmax:       1.14 m/s MV Vmean:      90.2 cm/s MV Decel Time: 160 msec MV E velocity: 128.00 cm/s MV A velocity: 109.00 cm/s MV E/A ratio:  1.17 MV A Prime:    17.4 cm/s Armanda Magic MD Electronically signed by Armanda Magic MD Signature Date/Time: 03/23/2023/2:39:36 PM    Final    CT Angio Chest Pulmonary Embolism (PE) W or WO Contrast Result Date: 03/23/2023 CLINICAL DATA:  Recent left leg DVT. History of PE. Patient complains of swelling the arms and legs. EXAM: CT ANGIOGRAPHY CHEST WITH  CONTRAST TECHNIQUE: Multidetector CT imaging of the chest was performed using the standard protocol during bolus administration of intravenous contrast. Multiplanar CT image reconstructions and MIPs were obtained to evaluate the vascular anatomy. RADIATION DOSE REDUCTION: This exam was performed according to the departmental dose-optimization program which includes automated exposure control, adjustment of the mA and/or kV according to patient size and/or use of iterative reconstruction technique. CONTRAST:  80mL OMNIPAQUE IOHEXOL 350 MG/ML SOLN COMPARISON:  03/06/2023. FINDINGS: Cardiovascular: Central pulmonary arteries are well opacified. There is relative decreased opacification of the segmental and smaller pulmonary emboli mildly limiting the exam. Allowing for this, there is no evidence of a pulmonary embolism. Heart is normal in size. No pericardial effusion. Mild left coronary artery calcifications. 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 No aortic dissection. Minor aortic atherosclerosis. Arch branch vessels are widely patent. Mediastinum/Nodes: No neck base, mediastinal or hilar masses. No enlarged lymph nodes. Trachea and esophagus are unremarkable. Lungs/Pleura: Lung volumes are low. Mild interstitial prominence in the lower lungs. Small area of peripheral reticulation in the peripheral right lower lobe. Mild areas mosaic perfusion suspected to be air trapping. No convincing pneumonia and no pulmonary edema. No pleural effusion or pneumothorax. Upper Abdomen: No acute findings.  Multiple gallstones. Musculoskeletal: No fracture or acute finding. Numerous sclerotic bone lesions consistent with metastatic prostate carcinoma. This is similar to the prior CT. Review of the MIP images confirms the above findings. IMPRESSION: 1. No evidence a pulmonary embolism. 2. No acute findings. 3. 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 Recommend semi-annual imaging followup by CTA or MRA and referral to  cardiothoracic surgery if not already obtained. This recommendation follows 2010 ACCF/AHA/AATS/ACR/ASA/SCA/SCAI/SIR/STS/SVM Guidelines for the Diagnosis and Management of Patients With Thoracic Aortic Disease. Circulation. 2010; 121: G956-O13. Aortic aneurysm NOS (ICD10-I71.9) 4. Minor aortic atherosclerosis mild left coronary artery calcifications. 5. Stable sclerotic bone lesions consistent with metastatic prostate carcinoma. Aortic Atherosclerosis (ICD10-I70.0). Electronically Signed   By: Amie Portland M.D.   On: 03/23/2023 09:34  DG Chest 2 View Result Date: 03/23/2023 CLINICAL DATA:  68 year old male with history of dyspnea. Swelling in bilateral legs. EXAM: CHEST - 2 VIEW COMPARISON:  Chest x-ray 03/06/2023. FINDINGS: Right internal jugular single-lumen power porta cath with tip terminating in the distal superior vena cava. Lung volumes are very low and film is under penetrated limiting the diagnostic sensitivity and specificity of the examination. With these limitations in mind, bibasilar opacities likely reflect subsegmental atelectasis. No definite consolidative airspace disease. No definite pleural effusions. No pneumothorax. Cephalization of the pulmonary vasculature, without frank pulmonary edema. Mild cardiomegaly. Upper mediastinal contours are within normal limits. Orthopedic fixation hardware in the lower cervical spine incidentally noted. IMPRESSION: 1. Low lung volumes with bibasilar subsegmental atelectasis. 2. Mild cardiomegaly with pulmonary venous congestion, but no frank pulmonary edema. Electronically Signed   By: Trudie Reed M.D.   On: 03/23/2023 05:42   US Venous Img Lower Bilateral (DVT) Result Date: 03/23/2023 CLINICAL DATA:  Left leg swelling, bilateral above knee amputation EXAM: BILATERAL LOWER EXTREMITY VENOUS DOPPLER ULTRASOUND TECHNIQUE: Gray-scale sonography with graded compression, as well as color Doppler and duplex ultrasound were performed to evaluate the lower  extremity deep venous systems from the level of the common femoral vein and including the common femoral, femoral, profunda femoral, popliteal and calf veins including the posterior tibial, peroneal and gastrocnemius veins when visible. The superficial great saphenous vein was also interrogated. Spectral Doppler was utilized to evaluate flow at rest and with distal augmentation maneuvers in the common femoral, femoral and popliteal veins. COMPARISON:  03/07/2023 FINDINGS: RIGHT LOWER EXTREMITY Common Femoral Vein: No evidence of thrombus. Normal compressibility, respiratory phasicity and response to augmentation. Saphenofemoral Junction: No evidence of thrombus. Normal compressibility and flow on color Doppler imaging. Profunda Femoral Vein: No evidence of thrombus. Normal compressibility and flow on color Doppler imaging. Femoral Vein: Limited evaluation due to patient's body habitus. Interval development of occlusive thrombus within the visualized proximal segment. The distal segment is not well visualized. Popliteal Vein: Absent Calf Veins: Absent Superficial Great Saphenous Vein: Not well visualized Venous Reflux:  Not assessed Other Findings:  None. LEFT LOWER EXTREMITY Common Femoral Vein: No evidence of thrombus. Normal compressibility, respiratory phasicity and response to augmentation. Saphenofemoral Junction: No evidence of thrombus. Normal compressibility and flow on color Doppler imaging. Profunda Femoral Vein: Occlusive thrombus within the visualized segment, similar prior examination Femoral Vein: Limited visualization due to the patient's body habitus. Persistent occlusive thrombus within the visualized proximal and the mid segment. Popliteal Vein: Absent Calf Veins: Absent Superficial Great Saphenous Vein: Not well assessed on this examination Venous Reflux:  Not assessed Other Findings:  None. IMPRESSION: 1. Interval development of occlusive thrombus within the visualized proximal segment of the  right femoral vein. 2. Persistent occlusive thrombus within the visualized segment of the left profunda femoral vein and proximal and mid segment of the left femoral vein. Electronically Signed   By: Helyn Numbers M.D.   On: 03/23/2023 04:50   DG HIP UNILAT WITH PELVIS 2-3 VIEWS RIGHT Result Date: 03/09/2023 CLINICAL DATA:  Pain EXAM: DG HIP (WITH OR WITHOUT PELVIS) 2-3V RIGHT COMPARISON:  CT abdomen pelvis dated 09/30/2022. FINDINGS: There is no evidence of hip fracture or dislocation. There is diffuse osseous demineralization. Mild osteoarthritis of both hips. Multiple sclerotic bony lesions are consistent with metastatic disease. A dilated loop of bowel in the lower right abdomen is partially imaged. IMPRESSION: 1. No acute osseous injury. 2. Dilated loop of bowel in the lower right abdomen  is partially imaged. Electronically Signed   By: Romona Curls M.D.   On: 03/09/2023 16:54   US Venous Img Lower Bilateral Result Date: 03/07/2023 CLINICAL DATA:  Leg swelling.  Bilateral AKA. EXAM: BILATERAL LOWER EXTREMITY VENOUS DOPPLER ULTRASOUND TECHNIQUE: Gray-scale sonography with graded compression, as well as color Doppler and duplex ultrasound were performed to evaluate the lower extremity deep venous systems from the level of the common femoral vein and including the common femoral, femoral, profunda femoral, popliteal and calf veins including the posterior tibial, peroneal and gastrocnemius veins when visible. The superficial great saphenous vein was also interrogated. Spectral Doppler was utilized to evaluate flow at rest and with distal augmentation maneuvers in the common femoral, femoral and popliteal veins. COMPARISON:  11/21/2016 FINDINGS: RIGHT LOWER EXTREMITY Common Femoral Vein: No evidence of thrombus. Normal compressibility, respiratory phasicity and response to augmentation. Saphenofemoral Junction: No evidence of thrombus. Normal compressibility and flow on color Doppler imaging. Profunda Femoral  Vein: No evidence of thrombus. Normal compressibility and flow on color Doppler imaging. Femoral Vein: No evidence of thrombus. Normal compressibility, respiratory phasicity and response to augmentation. Popliteal Vein: Above the knee amputation. Calf Veins: Above the knee amputation. LEFT LOWER EXTREMITY Common Femoral Vein: No evidence of thrombus. Normal compressibility, respiratory phasicity and response to augmentation. Saphenofemoral Junction: No evidence of thrombus. Normal compressibility and flow on color Doppler imaging. Profunda Femoral Vein: Occlusive thrombus. Femoral Vein: Occlusive thrombus. Popliteal Vein: Above the knee amputation. Calf Veins: Above the knee amputation. IMPRESSION: 1. Positive for DVT with occlusive thrombus in the left femoral and profundus femoral vein. 2. No DVT in the right lower extremity. These results were called by telephone at the time of interpretation on 03/07/2023 at 10:35 pm to provider [facility] , who verbally acknowledged these results. Electronically Signed   By: Narda Rutherford M.D.   On: 03/07/2023 22:35   CT Angio Chest PE W/Cm &/Or Wo Cm Result Date: 03/06/2023 CLINICAL DATA:  Shortness of breath with exertion EXAM: CT ANGIOGRAPHY CHEST WITH CONTRAST TECHNIQUE: Multidetector CT imaging of the chest was performed using the standard protocol during bolus administration of intravenous contrast. Multiplanar CT image reconstructions and MIPs were obtained to evaluate the vascular anatomy. RADIATION DOSE REDUCTION: This exam was performed according to the departmental dose-optimization program which includes automated exposure control, adjustment of the mA and/or kV according to patient size and/or use of iterative reconstruction technique. CONTRAST:  OMNIPAQUE IOHEXOL 350 MG/ML SOLN COMPARISON:  Chest x-ray from earlier in the same day, CT from 09/30/2022. FINDINGS: Cardiovascular: Atherosclerotic calcifications of the thoracic aorta are noted. Dilatation  of the ascending aorta to 4.3 cm is noted. No evidence of dissection is seen. The pulmonary artery shows a normal branching pattern bilaterally. No definitive filling defect to suggest pulmonary embolism is seen. Heart is mildly enlarged in size. No significant coronary calcifications noted. Venous collaterals are noted about the left shoulder and neck suspicious for central left subclavian vein narrowing. Right chest wall port is noted. Mediastinum/Nodes: No hilar or mediastinal adenopathy is noted. The esophagus as visualized is within normal limits. Thoracic inlet is unremarkable. Lungs/Pleura: Lungs are well aerated bilaterally. No focal infiltrate or sizable effusion is seen. Upper Abdomen: Upper abdomen shows multiple gallstones without complicating factors. Simple appearing left renal cysts are noted stable from prior exams. Musculoskeletal: Postsurgical changes at the cervicothoracic junction are seen. Scattered areas of sclerosis throughout the bony structures consistent with metastatic disease. This appears increased when compared with the prior exam.  This is consistent with the patient's given clinical history of prostate carcinoma. Review of the MIP images confirms the above findings. IMPRESSION: No evidence of pulmonary emboli. 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 Recommend annual imaging followup by CTA or MRA. This recommendation follows 2010 ACCF/AHA/AATS/ACR/ASA/SCA/SCAI/SIR/STS/SVM Guidelines for the Diagnosis and Management of Patients with Thoracic Aortic Disease. Circulation. 2010; 121: Z610-R604. Aortic aneurysm NOS (ICD10-I71.9) Changes consistent with prostate metastatic disease in the bony structures. Heavy collaterals around the left chest wall and shoulder suspicious for left subclavian narrowing. Aortic Atherosclerosis (ICD10-I70.0). Electronically Signed   By: Alcide Clever M.D.   On: 03/06/2023 21:56   DG Chest 2 View Result Date: 03/06/2023 CLINICAL DATA:  Dyspnea on exertion  for several days EXAM: CHEST - 2 VIEW COMPARISON:  None Available. FINDINGS: Frontal and lateral views of the chest demonstrate right chest wall port tip within the superior vena cava. Cardiac silhouette is unremarkable. No airspace disease, effusion, or pneumothorax. No acute bony abnormalities. IMPRESSION: 1. No acute intrathoracic process. Electronically Signed   By: Sharlet Salina M.D.   On: 03/06/2023 20:51    Microbiology: Results for orders placed or performed during the hospital encounter of 03/23/23  Resp panel by RT-PCR (RSV, Flu A&B, Covid) Anterior Nasal Swab     Status: None   Collection Time: 03/23/23  5:16 AM   Specimen: Anterior Nasal Swab  Result Value Ref Range Status   SARS Coronavirus 2 by RT PCR NEGATIVE NEGATIVE Final    Comment: (NOTE) SARS-CoV-2 target nucleic acids are NOT DETECTED.  The SARS-CoV-2 RNA is generally detectable in upper respiratory specimens during the acute phase of infection. The lowest concentration of SARS-CoV-2 viral copies this assay can detect is 138 copies/mL. A negative result does not preclude SARS-Cov-2 infection and should not be used as the sole basis for treatment or other patient management decisions. A negative result may occur with  improper specimen collection/handling, submission of specimen other than nasopharyngeal swab, presence of viral mutation(s) within the areas targeted by this assay, and inadequate number of viral copies(<138 copies/mL). A negative result must be combined with clinical observations, patient history, and epidemiological information. The expected result is Negative.  Fact Sheet for Patients:  BloggerCourse.com  Fact Sheet for Healthcare Providers:  SeriousBroker.it  This test is no t yet approved or cleared by the [place] FDA and  has been authorized for detection and/or diagnosis of SARS-CoV-2 by FDA under an Emergency Use Authorization (EUA).  This EUA will remain  in effect (meaning this test can be used) for the duration of the COVID-19 declaration under Section 564(b)(1) of the Act, 21 U.S.C.section 360bbb-3(b)(1), unless the authorization is terminated  or revoked sooner.       Influenza A by PCR NEGATIVE NEGATIVE Final   Influenza B by PCR NEGATIVE NEGATIVE Final    Comment: (NOTE) The Xpert Xpress SARS-CoV-2/FLU/RSV plus assay is intended as an aid in the diagnosis of influenza from Nasopharyngeal swab specimens and should not be used as a sole basis for treatment. Nasal washings and aspirates are unacceptable for Xpert Xpress SARS-CoV-2/FLU/RSV testing.  Fact Sheet for Patients: BloggerCourse.com  Fact Sheet for Healthcare Providers: SeriousBroker.it  This test is not yet approved or cleared by the [place] FDA and has been authorized for detection and/or diagnosis of SARS-CoV-2 by FDA under an Emergency Use Authorization (EUA). This EUA will remain in effect (meaning this test can be used) for the duration of the COVID-19 declaration under Section 564(b)(1)  of the Act, 21 U.S.C. section 360bbb-3(b)(1), unless the authorization is terminated or revoked.     Resp Syncytial Virus by PCR NEGATIVE NEGATIVE Final    Comment: (NOTE) Fact Sheet for Patients: BloggerCourse.com  Fact Sheet for Healthcare Providers: SeriousBroker.it  This test is not yet approved or cleared by the [place] FDA and has been authorized for detection and/or diagnosis of SARS-CoV-2 by FDA under an Emergency Use Authorization (EUA). This EUA will remain in effect (meaning this test can be used) for the duration of the COVID-19 declaration under Section 564(b)(1) of the Act, 21 U.S.C. section 360bbb-3(b)(1), unless the authorization is terminated or revoked.  Performed at [facility], [address]  [place], [place] [address]   Gastrointestinal Panel by PCR , Stool     Status: None   Collection Time: 03/26/23  8:45 AM   Specimen: Stool  Result Value Ref Range Status   Campylobacter species NOT DETECTED NOT DETECTED Final   Plesimonas shigelloides NOT DETECTED NOT DETECTED Final   Salmonella species NOT DETECTED NOT DETECTED Final   Yersinia enterocolitica NOT DETECTED NOT DETECTED Final   Vibrio species NOT DETECTED NOT DETECTED Final   Vibrio cholerae NOT DETECTED NOT DETECTED Final   Enteroaggregative E coli (EAEC) NOT DETECTED NOT DETECTED Final   Enteropathogenic E coli (EPEC) NOT DETECTED NOT DETECTED Final   Enterotoxigenic E coli (ETEC) NOT DETECTED NOT DETECTED Final   Shiga like toxin producing E coli (STEC) NOT DETECTED NOT DETECTED Final   Shigella/Enteroinvasive E coli (EIEC) NOT DETECTED NOT DETECTED Final   Cryptosporidium NOT DETECTED NOT DETECTED Final   Cyclospora cayetanensis NOT DETECTED NOT DETECTED Final   Entamoeba histolytica NOT DETECTED NOT DETECTED Final   Giardia lamblia NOT DETECTED NOT DETECTED Final   Adenovirus F40/41 NOT DETECTED NOT DETECTED Final   Astrovirus NOT DETECTED NOT DETECTED Final   Norovirus GI/GII NOT DETECTED NOT DETECTED Final   Rotavirus A NOT DETECTED NOT DETECTED Final   Sapovirus (I, II, IV, and V) NOT DETECTED NOT DETECTED Final    Comment: Performed at [facility], [address] [place], [place] [address]    Labs: CBC: Recent Labs  Lab 03/30/23 0511 04/03/23 0517 04/04/23 0520 04/05/23 0451  WBC 2.2* 2.5* 2.7* 3.5*  NEUTROABS 1.2*  --  1.4* 2.4  HGB 11.0* 11.7* 11.3* 11.9*  HCT 34.2* 35.8* 34.3* 36.2*  MCV 101.2* 97.3 98.6 98.6  PLT 158 234 241 263   Basic Metabolic Panel: Recent Labs  Lab 03/30/23 0511 03/31/23 0502 04/01/23 0510 04/02/23 0515 04/03/23 0517 04/04/23 0520 04/05/23 0451  NA 139   < > 138 140 139 139 140  K 3.0*   < > 3.1* 3.3* 3.5 3.4* 3.0*  CL 99   < > 99 102 102 104 102  CO2  30   < > 30 29 28 28 28   GLUCOSE 97   < > 93 97 90 91 117*  BUN 10   < > 13 14 13 14 15   CREATININE 0.82   < > 0.80 0.82 0.75 0.78 0.98  CALCIUM 9.1   < > 9.5 9.6 9.7 9.6 9.5  MG 2.1  --   --   --  2.0  --   --    < > = values in this interval not displayed.   Liver Function Tests: Recent Labs  Lab 04/03/23 0517  AST 24  ALT 21  ALKPHOS 64  BILITOT 0.8  PROT 6.8  ALBUMIN 3.8   CBG: Recent Labs  Lab 03/31/23 2111 04/04/23 2235  GLUCAP 116* 109*    Discharge time spent: greater than 30 minutes.  Signed: Jonah Blue, MD [facility] 04/05/2023

## 2023-04-05 NOTE — Consult Note (Signed)
PHARMACY - ANTICOAGULATION CONSULT NOTE  Pharmacy Consult for warfarin  Indication: DVT  Allergies  Allergen Reactions   Bacitracin Hives   Vancomycin Other (See Comments) and Rash    Red man syndrome.   Ketorolac     Other Reaction(s): Kidney Disorder  Other reaction(s): Kidney Disorder   Spironolactone     Other Reaction(s): Unknown  Other reaction(s): Unknown   Patient Measurements: Height: 4\' 10"  (147.3 cm) Weight: (!) 136.2 kg (300 lb 4.8 oz) IBW/kg (Calculated) : 45.4  Vital Signs: Temp: 98.2 F (36.8 C) (01/31 0754) Temp Source: Oral (01/31 0352) BP: 121/67 (01/31 0754) Pulse Rate: 64 (01/31 0754)  Labs: Recent Labs    04/03/23 0517 04/04/23 0520 04/05/23 0451  HGB 11.7* 11.3* 11.9*  HCT 35.8* 34.3* 36.2*  PLT 234 241 263  LABPROT 21.9* 24.3* 24.5*  INR 1.9* 2.2* 2.2*  CREATININE 0.75 0.78 0.98   Estimated Creatinine Clearance: 84.5 mL/min (by C-G formula based on SCr of 0.98 mg/dL).  Medical History: Past Medical History:  Diagnosis Date   Depression    History of blood clots 08/13/2011   History of left knee replacement    HTN (hypertension)    Lymphedema    lt leg-per pt   PE (pulmonary thromboembolism) (HCC)    Pressure injury of skin, unspecified injury stage, unspecified location    S/P AKA (above knee amputation) bilateral (HCC)    rt 11/24/07 and lt 01/11/17   Warfarin INR 1.2 on 03/19/2023 after 2 weeks on warfarin 5 mg daily. Per note review - warfarin dose was changed to 7.5 mg three times a week and 5 mg on the other four days.  INR on admission 1.3 (03/23/2023). Therapeutic enoxaparin 1mg /kg q 12hr initiated as well.  Assessment: PMH significant of stage IV prostate cancer, recurrent DVT and PE, lymphedema, hypertension, status post AKA bilaterally, pressure ulcers. Patient presents with recurrent DVT despite eliquis use. INR sub-therapeutic on admission and sub-therapeutic on last outpatient check as well. Pharmacy has been consulted  to manage this patient's warfarin.   Goal of Therapy:  INR 2-3 Monitor platelets by anticoagulation protocol: Yes  Lovenox:  1mg /kg q12h  Warfarin:  Date INR Assessment Warfarin Dose  Comments  1/18 1.3 SUBtherapeutic --- Enoxaparin 1mg /kg q12 hrs  1/19 ---  7.5 mg Enoxaparin 1mg /kg q12 hrs  1/20 1.3 SUBtherapeutic 10 mg  Enoxaparin 1mg /kg q12 hrs  1/21 1.3  SUBtherapeutic 10 mg Enoxaparin 1mg /kg q12 hrs  1/22 1.3 SUBtherapeutic 15 mg  Enoxaparin 1mg /kg q12 hrs  1/23 1.3 SUBtherapeutic 15 mg Enoxaparin 1mg /kg q12 hrs  1/24 1.6 SUBtherapeutic 15 mg Enoxaparin 1mg /kg q12 hrs  1/25 2.1 Therapeutic x 1 7.5 mg Enoxaparin 1mg /kg q12 hrs  1/26 2.1 Therapeutic x 2 7.5mg  D/C enoxaparin  1/27 1.5 SUBtherapeutic 15 mg Resume enoxaparin  1/28 1.7 SUBtherapeutic 15 mg enoxaparin  1/29 1.9 SUBtherapeutic 15 mg enoxaparin  1/30 2.2 therapeutic 12 mg D/c enoxaparin  1/31 2.2 therapeutic                 Plan:  INR therapeutic.  Will order Warfarin 12 mg po x 1  again today. Expected need warfarin 10-12mg  per day to keep INR therapeutic (outpatient warfarin dose was NOT stable prior to admission). No new drug-drug interactions noted Daily INR for now CBC stable; monitor at least every 72 hours   Bari Mantis PharmD Clinical Pharmacist 04/05/2023

## 2023-04-05 NOTE — TOC Transition Note (Addendum)
Transition of Care [facility]) - Discharge Note   Patient Details  Name: [name] MRN: [id number] Date of Birth: Jul 27, 1955  [facility]) CM/SW Contact:  Lorri Frederick, LCSW Phone Number: 04/05/2023, 1:33 PM   Clinical Narrative:  Pt discharging home with [facility].  No DME needs.   1430: CSW informed by RN that pt is requesting ambulance transport home.  [facility] transport set up.   Final next level of care: Home w Home Health Services Barriers to Discharge: Continued Medical Work up   Patient Goals and CMS Choice Patient states their goals for this hospitalization and ongoing recovery are:: Home with home health          Discharge Placement                       Discharge Plan and Services Additional resources added to the After Visit Summary for       Post Acute Care Choice: Home Health                      [facility] Agency: [facility] Date [facility] Agency Contacted: 04/05/23   Representative spoke with at [facility] Agency: Maralyn Sago  Social Drivers of Health (SDOH) Interventions SDOH Screenings   Food Insecurity: No Food Insecurity (03/25/2023)  Housing: Low Risk  (03/25/2023)  Transportation Needs: No Transportation Needs (03/25/2023)  Utilities: Not At Risk (03/25/2023)  Financial Resource Strain: High Risk (07/28/2021)   Received from [facility], [facility]  Physical Activity: Sufficiently Active (07/28/2021)   Received from [facility], [facility]  Social Connections: Moderately Integrated (03/25/2023)  Stress: Stress Concern Present (08/01/2021)   Received from [facility]  Tobacco Use: Medium Risk (04/03/2023)     Readmission Risk Interventions    03/25/2023   10:53 AM  Readmission Risk Prevention Plan  Transportation Screening Complete  PCP or Specialist Appt within 5-7 Days Complete  Home Care Screening Complete   Medication Review (RN CM) Complete

## 2023-04-05 NOTE — Progress Notes (Signed)
Discharge instructions were reviewed with patient. Chest port was removed by the IV team. Belongings were collected and given to patient. Questions were encourage and answered. EMS for transport.

## 2023-04-05 NOTE — TOC Progression Note (Signed)
Transition of Care [facility]) - Progression Note    Patient Details  Name: [name] MRN: [id number] Date of Birth: [birth year]  Transition [facility] Care [facility]) CM/SW Contact  Lorri Frederick, LCSW Phone Number: 04/05/2023, 1:11 PM  Clinical Narrative:   CSW confirmed with Sarah/[facility] that pt is active and they can resume services.     Expected Discharge Plan: Home w Home Health Services Barriers to Discharge: Continued Medical Work up  Expected Discharge Plan and Services     Post Acute Care Choice: Home Health Living arrangements for the past 2 months: Single Family Home Expected Discharge Date: 04/05/23                                     Social Determinants of Health (SDOH) Interventions SDOH Screenings   Food Insecurity: No Food Insecurity (03/25/2023)  Housing: Low Risk  (03/25/2023)  Transportation Needs: No Transportation Needs (03/25/2023)  Utilities: Not At Risk (03/25/2023)  Financial Resource Strain: High Risk (07/28/2021)   Received from [facility], [facility]  Physical Activity: Sufficiently Active (07/28/2021)   Received from [facility], [facility]  Social Connections: Moderately Integrated (03/25/2023)  Stress: Stress Concern Present (08/01/2021)   Received from [facility], [facility]  Tobacco Use: Medium Risk (04/03/2023)    Readmission Risk Interventions    03/25/2023   10:53 AM  Readmission Risk Prevention Plan  Transportation Screening Complete  PCP or Specialist Appt within 5-7 Days Complete  Home Care Screening Complete  Medication Review (RN CM) Complete

## 2023-04-05 NOTE — Progress Notes (Signed)
   04/05/23 1200  Spiritual Encounters  Type of Visit Initial  Care provided to: Patient  Conversation partners present during encounter Nurse  Referral source Chaplain assessment  Reason for visit Routine spiritual support  OnCall Visit No  Spiritual Framework  Presenting Themes Meaning/purpose/sources of inspiration;Goals in life/care;Rituals and practive;Courage hope and growth;Impactful experiences and emotions;Coping tools  Community/Connection Limited  Patient Stress Factors Health changes  Interventions  Spiritual Care Interventions Made Established relationship of care and support;Compassionate presence;Reflective listening;Encouragement;Self-care teaching  Intervention Outcomes  Outcomes Connection to spiritual care;Awareness around self/spiritual resourses;Awareness of support  Spiritual Care Plan  Spiritual Care Issues Still Outstanding No further spiritual care needs at this time (see row info)   While rounding chaplain stopped by to check on the patient. Patient shared that he is leaving today and that he is excited about that. Patient shared with the chaplain life goals and different life transitions that he has faced. Patient talked about familial issues and certain medical issues. Patient is very hopeful, has strong faith and stated that he is very resilient. Patient was happy to talk with the chaplain.

## 2023-04-08 ENCOUNTER — Ambulatory Visit: Payer: Medicare PPO | Admitting: Internal Medicine

## 2023-04-08 ENCOUNTER — Other Ambulatory Visit: Payer: Medicare PPO

## 2023-04-09 ENCOUNTER — Other Ambulatory Visit: Payer: Self-pay

## 2023-04-09 ENCOUNTER — Emergency Department: Payer: Medicare PPO

## 2023-04-09 ENCOUNTER — Inpatient Hospital Stay
Admission: EM | Admit: 2023-04-09 | Discharge: 2023-04-27 | DRG: 314 | Disposition: A | Discharge status: Home Health | Payer: Medicare PPO | Attending: Internal Medicine | Admitting: Internal Medicine

## 2023-04-09 DIAGNOSIS — R509 Fever, unspecified: Secondary | ICD-10-CM | POA: Diagnosis not present

## 2023-04-09 DIAGNOSIS — C61 Malignant neoplasm of prostate: Secondary | ICD-10-CM | POA: Diagnosis present

## 2023-04-09 DIAGNOSIS — E876 Hypokalemia: Secondary | ICD-10-CM | POA: Diagnosis present

## 2023-04-09 DIAGNOSIS — A419 Sepsis, unspecified organism: Principal | ICD-10-CM | POA: Diagnosis present

## 2023-04-09 DIAGNOSIS — Z87891 Personal history of nicotine dependence: Secondary | ICD-10-CM

## 2023-04-09 DIAGNOSIS — R791 Abnormal coagulation profile: Secondary | ICD-10-CM | POA: Diagnosis not present

## 2023-04-09 DIAGNOSIS — Z888 Allergy status to other drugs, medicaments and biological substances status: Secondary | ICD-10-CM

## 2023-04-09 DIAGNOSIS — Z820 Family history of epilepsy and other diseases of the nervous system: Secondary | ICD-10-CM

## 2023-04-09 DIAGNOSIS — Z515 Encounter for palliative care: Secondary | ICD-10-CM

## 2023-04-09 DIAGNOSIS — I739 Peripheral vascular disease, unspecified: Secondary | ICD-10-CM | POA: Diagnosis present

## 2023-04-09 DIAGNOSIS — I76 Septic arterial embolism: Secondary | ICD-10-CM

## 2023-04-09 DIAGNOSIS — Z8546 Personal history of malignant neoplasm of prostate: Secondary | ICD-10-CM

## 2023-04-09 DIAGNOSIS — I2690 Septic pulmonary embolism without acute cor pulmonale: Secondary | ICD-10-CM | POA: Diagnosis present

## 2023-04-09 DIAGNOSIS — I110 Hypertensive heart disease with heart failure: Secondary | ICD-10-CM | POA: Diagnosis present

## 2023-04-09 DIAGNOSIS — Z89612 Acquired absence of left leg above knee: Secondary | ICD-10-CM

## 2023-04-09 DIAGNOSIS — I2699 Other pulmonary embolism without acute cor pulmonale: Secondary | ICD-10-CM | POA: Diagnosis present

## 2023-04-09 DIAGNOSIS — Z95828 Presence of other vascular implants and grafts: Secondary | ICD-10-CM

## 2023-04-09 DIAGNOSIS — R339 Retention of urine, unspecified: Secondary | ICD-10-CM | POA: Diagnosis not present

## 2023-04-09 DIAGNOSIS — T501X5A Adverse effect of loop [high-ceiling] diuretics, initial encounter: Secondary | ICD-10-CM | POA: Diagnosis not present

## 2023-04-09 DIAGNOSIS — Z96653 Presence of artificial knee joint, bilateral: Secondary | ICD-10-CM | POA: Diagnosis present

## 2023-04-09 DIAGNOSIS — R21 Rash and other nonspecific skin eruption: Secondary | ICD-10-CM

## 2023-04-09 DIAGNOSIS — E869 Volume depletion, unspecified: Secondary | ICD-10-CM | POA: Diagnosis present

## 2023-04-09 DIAGNOSIS — Z1152 Encounter for screening for COVID-19: Secondary | ICD-10-CM

## 2023-04-09 DIAGNOSIS — Z9221 Personal history of antineoplastic chemotherapy: Secondary | ICD-10-CM

## 2023-04-09 DIAGNOSIS — I5033 Acute on chronic diastolic (congestive) heart failure: Secondary | ICD-10-CM | POA: Diagnosis not present

## 2023-04-09 DIAGNOSIS — Z801 Family history of malignant neoplasm of trachea, bronchus and lung: Secondary | ICD-10-CM

## 2023-04-09 DIAGNOSIS — Z881 Allergy status to other antibiotic agents status: Secondary | ICD-10-CM

## 2023-04-09 DIAGNOSIS — Z7901 Long term (current) use of anticoagulants: Secondary | ICD-10-CM

## 2023-04-09 DIAGNOSIS — K5641 Fecal impaction: Secondary | ICD-10-CM | POA: Diagnosis not present

## 2023-04-09 DIAGNOSIS — Z89611 Acquired absence of right leg above knee: Secondary | ICD-10-CM

## 2023-04-09 DIAGNOSIS — S025XXA Fracture of tooth (traumatic), initial encounter for closed fracture: Secondary | ICD-10-CM | POA: Diagnosis present

## 2023-04-09 DIAGNOSIS — T80212A Local infection due to central venous catheter, initial encounter: Secondary | ICD-10-CM | POA: Diagnosis not present

## 2023-04-09 DIAGNOSIS — B9562 Methicillin resistant Staphylococcus aureus infection as the cause of diseases classified elsewhere: Secondary | ICD-10-CM

## 2023-04-09 DIAGNOSIS — J189 Pneumonia, unspecified organism: Secondary | ICD-10-CM

## 2023-04-09 DIAGNOSIS — G9341 Metabolic encephalopathy: Secondary | ICD-10-CM | POA: Diagnosis not present

## 2023-04-09 DIAGNOSIS — J9601 Acute respiratory failure with hypoxia: Secondary | ICD-10-CM

## 2023-04-09 DIAGNOSIS — C7951 Secondary malignant neoplasm of bone: Secondary | ICD-10-CM | POA: Diagnosis present

## 2023-04-09 DIAGNOSIS — Z86718 Personal history of other venous thrombosis and embolism: Secondary | ICD-10-CM

## 2023-04-09 DIAGNOSIS — Y95 Nosocomial condition: Secondary | ICD-10-CM | POA: Diagnosis present

## 2023-04-09 DIAGNOSIS — Z5986 Financial insecurity: Secondary | ICD-10-CM

## 2023-04-09 DIAGNOSIS — G893 Neoplasm related pain (acute) (chronic): Secondary | ICD-10-CM | POA: Diagnosis present

## 2023-04-09 DIAGNOSIS — L89322 Pressure ulcer of left buttock, stage 2: Secondary | ICD-10-CM | POA: Diagnosis present

## 2023-04-09 DIAGNOSIS — D638 Anemia in other chronic diseases classified elsewhere: Secondary | ICD-10-CM | POA: Diagnosis present

## 2023-04-09 DIAGNOSIS — K5981 Ogilvie syndrome: Secondary | ICD-10-CM | POA: Diagnosis present

## 2023-04-09 DIAGNOSIS — R6520 Severe sepsis without septic shock: Principal | ICD-10-CM

## 2023-04-09 DIAGNOSIS — N179 Acute kidney failure, unspecified: Secondary | ICD-10-CM

## 2023-04-09 DIAGNOSIS — Z86711 Personal history of pulmonary embolism: Secondary | ICD-10-CM

## 2023-04-09 DIAGNOSIS — R918 Other nonspecific abnormal finding of lung field: Secondary | ICD-10-CM

## 2023-04-09 DIAGNOSIS — E871 Hypo-osmolality and hyponatremia: Secondary | ICD-10-CM | POA: Diagnosis present

## 2023-04-09 DIAGNOSIS — Z79899 Other long term (current) drug therapy: Secondary | ICD-10-CM

## 2023-04-09 DIAGNOSIS — M47816 Spondylosis without myelopathy or radiculopathy, lumbar region: Secondary | ICD-10-CM | POA: Diagnosis present

## 2023-04-09 DIAGNOSIS — L89893 Pressure ulcer of other site, stage 3: Secondary | ICD-10-CM | POA: Diagnosis present

## 2023-04-09 DIAGNOSIS — Z66 Do not resuscitate: Secondary | ICD-10-CM | POA: Diagnosis not present

## 2023-04-09 DIAGNOSIS — I2510 Atherosclerotic heart disease of native coronary artery without angina pectoris: Secondary | ICD-10-CM | POA: Diagnosis present

## 2023-04-09 DIAGNOSIS — L8990 Pressure ulcer of unspecified site, unspecified stage: Secondary | ICD-10-CM | POA: Diagnosis present

## 2023-04-09 DIAGNOSIS — K8020 Calculus of gallbladder without cholecystitis without obstruction: Secondary | ICD-10-CM | POA: Diagnosis present

## 2023-04-09 DIAGNOSIS — Z6841 Body Mass Index (BMI) 40.0 and over, adult: Secondary | ICD-10-CM

## 2023-04-09 DIAGNOSIS — A4102 Sepsis due to Methicillin resistant Staphylococcus aureus: Secondary | ICD-10-CM | POA: Diagnosis present

## 2023-04-09 DIAGNOSIS — Z981 Arthrodesis status: Secondary | ICD-10-CM

## 2023-04-09 DIAGNOSIS — R197 Diarrhea, unspecified: Secondary | ICD-10-CM | POA: Diagnosis present

## 2023-04-09 DIAGNOSIS — F05 Delirium due to known physiological condition: Secondary | ICD-10-CM | POA: Diagnosis not present

## 2023-04-09 DIAGNOSIS — I10 Essential (primary) hypertension: Secondary | ICD-10-CM | POA: Diagnosis present

## 2023-04-09 LAB — COMPREHENSIVE METABOLIC PANEL
ALT: 22 U/L (ref 0–44)
AST: 48 U/L — ABNORMAL HIGH (ref 15–41)
Albumin: 3.8 g/dL (ref 3.5–5.0)
Alkaline Phosphatase: 59 U/L (ref 38–126)
Anion gap: 14 (ref 5–15)
BUN: 57 mg/dL — ABNORMAL HIGH (ref 8–23)
CO2: 21 mmol/L — ABNORMAL LOW (ref 22–32)
Calcium: 9.2 mg/dL (ref 8.9–10.3)
Chloride: 90 mmol/L — ABNORMAL LOW (ref 98–111)
Creatinine, Ser: 3.89 mg/dL — ABNORMAL HIGH (ref 0.61–1.24)
GFR, Estimated: 16 mL/min — ABNORMAL LOW (ref >60)
Glucose, Bld: 117 mg/dL — ABNORMAL HIGH (ref 70–99)
Potassium: 3.3 mmol/L — ABNORMAL LOW (ref 3.5–5.1)
Sodium: 125 mmol/L — ABNORMAL LOW (ref 135–145)
Total Bilirubin: 2.6 mg/dL — ABNORMAL HIGH (ref 0.0–1.2)
Total Protein: 7.2 g/dL (ref 6.5–8.1)

## 2023-04-09 LAB — CBC WITH DIFFERENTIAL/PLATELET
Abs Immature Granulocytes: 0.13 10*3/uL — ABNORMAL HIGH (ref 0.00–0.07)
Basophils Absolute: 0.1 10*3/uL (ref 0.0–0.1)
Basophils Relative: 0 %
Eosinophils Absolute: 0 10*3/uL (ref 0.0–0.5)
Eosinophils Relative: 0 %
HCT: 31.4 % — ABNORMAL LOW (ref 39.0–52.0)
Hemoglobin: 10.9 g/dL — ABNORMAL LOW (ref 13.0–17.0)
Immature Granulocytes: 1 %
Lymphocytes Relative: 2 %
Lymphs Abs: 0.3 10*3/uL — ABNORMAL LOW (ref 0.7–4.0)
MCH: 32 pg (ref 26.0–34.0)
MCHC: 34.7 g/dL (ref 30.0–36.0)
MCV: 92.1 fL (ref 80.0–100.0)
Monocytes Absolute: 1.3 10*3/uL — ABNORMAL HIGH (ref 0.1–1.0)
Monocytes Relative: 7 %
Neutro Abs: 18.2 10*3/uL — ABNORMAL HIGH (ref 1.7–7.7)
Neutrophils Relative %: 90 %
Platelets: 299 10*3/uL (ref 150–400)
RBC: 3.41 MIL/uL — ABNORMAL LOW (ref 4.22–5.81)
RDW: 14.8 % (ref 11.5–15.5)
WBC: 20 10*3/uL — ABNORMAL HIGH (ref 4.0–10.5)
nRBC: 0 % (ref 0.0–0.2)

## 2023-04-09 LAB — RESP PANEL BY RT-PCR (RSV, FLU A&B, COVID)  RVPGX2
Influenza A by PCR: NEGATIVE
Influenza B by PCR: NEGATIVE
Resp Syncytial Virus by PCR: NEGATIVE
SARS Coronavirus 2 by RT PCR: NEGATIVE

## 2023-04-09 LAB — TROPONIN I (HIGH SENSITIVITY): Troponin I (High Sensitivity): 22 ng/L — ABNORMAL HIGH (ref <18)

## 2023-04-09 LAB — BLOOD GAS, VENOUS
Acid-base deficit: 2.9 mmol/L — ABNORMAL HIGH (ref 0.0–2.0)
Bicarbonate: 22 mmol/L (ref 20.0–28.0)
O2 Saturation: 79.6 %
Patient temperature: 37
pCO2, Ven: 38 mm[Hg] — ABNORMAL LOW (ref 44–60)
pH, Ven: 7.37 (ref 7.25–7.43)
pO2, Ven: 47 mm[Hg] — ABNORMAL HIGH (ref 32–45)

## 2023-04-09 LAB — PROCALCITONIN: Procalcitonin: 5.81 ng/mL

## 2023-04-09 LAB — LACTIC ACID, PLASMA: Lactic Acid, Venous: 1.6 mmol/L (ref 0.5–1.9)

## 2023-04-09 LAB — BRAIN NATRIURETIC PEPTIDE: B Natriuretic Peptide: 110.6 pg/mL — ABNORMAL HIGH (ref 0.0–100.0)

## 2023-04-09 MED ORDER — ACETAMINOPHEN 325 MG RE SUPP
RECTAL | Status: AC
  Administered 2023-04-09: 650 mg via RECTAL
  Filled 2023-04-09: qty 2

## 2023-04-09 MED ORDER — SODIUM CHLORIDE 0.9 % IV SOLN
2.0000 g | Freq: Once | INTRAVENOUS | Status: AC
  Administered 2023-04-09: 2 g via INTRAVENOUS
  Filled 2023-04-09: qty 12.5

## 2023-04-09 MED ORDER — SODIUM CHLORIDE 0.9 % IV BOLUS
1000.0000 mL | Freq: Once | INTRAVENOUS | Status: AC
  Administered 2023-04-09: 1000 mL via INTRAVENOUS

## 2023-04-09 MED ORDER — ACETAMINOPHEN 325 MG RE SUPP
650.0000 mg | Freq: Once | RECTAL | Status: AC
Start: 2023-04-09 — End: 2023-04-09

## 2023-04-09 MED ORDER — VANCOMYCIN HCL IN DEXTROSE 1-5 GM/200ML-% IV SOLN
1000.0000 mg | Freq: Once | INTRAVENOUS | Status: AC
  Administered 2023-04-09: 1000 mg via INTRAVENOUS
  Filled 2023-04-09: qty 200

## 2023-04-09 NOTE — ED Notes (Signed)
Pt transitioned from BiPAP to 4L [place]

## 2023-04-09 NOTE — ED Provider Notes (Signed)
 [facility] Provider Note    Event Date/Time   First MD Initiated Contact with Patient 04/09/23 2018     (approximate)   History   Chief Complaint: Shortness of Breath   HPI  [name] is a 68 y.o. male with a history of metastatic prostate cancer, DVT, PE, obesity, bilateral AKA who comes to the ED due to confusion and shortness of breath today, EMS reports respiratory distress with room air oxygen saturation of 89%.  They placed him on CPAP.          Physical Exam   Triage Vital Signs: ED Triage Vitals  Encounter Vitals Group     BP 04/09/23 2019 114/67     Systolic BP Percentile --      Diastolic BP Percentile --      Pulse Rate 04/09/23 2019 88     Resp 04/09/23 2019 (!) 31     Temp 04/09/23 2038 (!) 101.9 F (38.8 C)     Temp Source 04/09/23 2038 Rectal     SpO2 04/09/23 2019 100 %     Weight 04/09/23 2019 299 lb 13.2 oz (136 kg)     Height 04/09/23 2019 4' 10 (1.473 m)     Head Circumference --      Peak Flow --      Pain Score 04/09/23 2019 8     Pain Loc --      Pain Education --      Exclude from Growth Chart --     Most recent vital signs: Vitals:   04/09/23 2019 04/09/23 2038  BP: 114/67   Pulse: 88   Resp: (!) 31   Temp:  (!) 101.9 F (38.8 C)  SpO2: 100%     General: Awake,  CV:  Good peripheral perfusion.  Regular rate rhythm Resp:  Tachypnea, accessory muscle use.  He moderate respiratory distress with expiratory wheezing Abd:  No distention.  Soft nontender Other:  Trace pitting edema bilateral lower extremity stumps.  No open wounds or inflammatory skin changes   ED Results / Procedures / Treatments   Labs (all labs ordered are listed, but only abnormal results are displayed) Labs Reviewed  CBC WITH DIFFERENTIAL/PLATELET - Abnormal; Notable for the following components:      Result Value   WBC 20.0 (*)    RBC 3.41 (*)    Hemoglobin 10.9 (*)    HCT 31.4 (*)    Neutro Abs 18.2 (*)     Lymphs Abs 0.3 (*)    Monocytes Absolute 1.3 (*)    Abs Immature Granulocytes 0.13 (*)    All other components within normal limits  COMPREHENSIVE METABOLIC PANEL - Abnormal; Notable for the following components:   Sodium 125 (*)    Potassium 3.3 (*)    Chloride 90 (*)    CO2 21 (*)    Glucose, Bld 117 (*)    BUN 57 (*)    Creatinine, Ser 3.89 (*)    AST 48 (*)    Total Bilirubin 2.6 (*)    GFR, Estimated 16 (*)    All other components within normal limits  BRAIN NATRIURETIC PEPTIDE - Abnormal; Notable for the following components:   B Natriuretic Peptide 110.6 (*)    All other components within normal limits  TROPONIN I (HIGH SENSITIVITY) - Abnormal; Notable for the following components:   Troponin I (High Sensitivity) 22 (*)    All other components within normal limits  RESP  PANEL BY RT-PCR (RSV, FLU A&B, COVID)  RVPGX2  CULTURE, BLOOD (ROUTINE X 2)  CULTURE, BLOOD (ROUTINE X 2)  LACTIC ACID, PLASMA  PROCALCITONIN  LACTIC ACID, PLASMA  BLOOD GAS, VENOUS  URINALYSIS, W/ REFLEX TO CULTURE (INFECTION SUSPECTED)     EKG Interpreted by me Sinus rhythm rate of 87.  Normal axis and intervals.  Normal QRS ST segments and T waves   RADIOLOGY Chest x-ray interpreted by me, no obvious consolidation.  Radiology report reviewed   PROCEDURES:  .Critical Care  Performed by: Viviann Pastor, MD Authorized by: Viviann Pastor, MD   Critical care provider statement:    Critical care time (minutes):  35   Critical care time was exclusive of:  Separately billable procedures and treating other patients   Critical care was necessary to treat or prevent imminent or life-threatening deterioration of the following conditions:  Sepsis and respiratory failure   Critical care was time spent personally by me on the following activities:  Development of treatment plan with patient or surrogate, discussions with consultants, evaluation of patient's response to treatment, examination of  patient, obtaining history from patient or surrogate, ordering and performing treatments and interventions, ordering and review of laboratory studies, ordering and review of radiographic studies, pulse oximetry, re-evaluation of patient's condition and review of old charts   Care discussed with: admitting provider      MEDICATIONS ORDERED IN ED: Medications  vancomycin  (VANCOCIN ) IVPB 1000 mg/200 mL premix (has no administration in time range)  sodium chloride  0.9 % bolus 1,000 mL (has no administration in time range)  acetaminophen  (TYLENOL ) suppository 650 mg (650 mg Rectal Given 04/09/23 2046)  ceFEPIme  (MAXIPIME ) 2 g in sodium chloride  0.9 % 100 mL IVPB (2 g Intravenous New Bag/Given 04/09/23 2130)     IMPRESSION / MDM / ASSESSMENT AND PLAN / ED COURSE  I reviewed the triage vital signs and the nursing notes.  DDx: Pneumonia, pulmonary edema, pleural effusion, non-STEMI, acute CHF, COVID, influenza, electrolyte derangement, renal failure  Patient's presentation is most consistent with acute presentation with potential threat to life or bodily function.  Patient presents with shortness of breath, found to be tachypneic febrile and hypoxic.  Concern for HCAP and sepsis primarily.  Will obtain lab panel while starting IV cefepime  and vancomycin .  BiPAP on arrival.  Will need to admit.   ----------------------------------------- 10:58 PM on 04/09/2023 ----------------------------------------- Chest x-ray nondiagnostic, noncontrast CT chest abdomen pelvis ordered.  High suspicion for pneumonia given elevated procalcitonin, leukocytosis, fever, respiratory symptoms.  Case discussed with hospitalist for further management.      FINAL CLINICAL IMPRESSION(S) / ED DIAGNOSES   Final diagnoses:  Severe sepsis (HCC)  Acute respiratory failure with hypoxia (HCC)  AKI (acute kidney injury) (HCC)  Morbid obesity (HCC)     Rx / DC Orders   ED Discharge Orders     None        Note:   This document was prepared using Dragon voice recognition software and may include unintentional dictation errors.   Viviann Pastor, MD 04/09/23 2258

## 2023-04-09 NOTE — ED Triage Notes (Signed)
Pt arrives via [facility] from home with CC of confusion and SOB. Pt recently hospitalized. CPAP on arrival - EMS reports SpO2 89% on arrival. Pt A&Ox4 at this time.

## 2023-04-10 ENCOUNTER — Inpatient Hospital Stay (HOSPITAL_COMMUNITY)
Admit: 2023-04-10 | Discharge: 2023-04-10 | Disposition: A | Discharge status: Home / Self Care | Payer: Medicare PPO | Attending: Family Medicine

## 2023-04-10 ENCOUNTER — Inpatient Hospital Stay
Admit: 2023-04-10 | Discharge: 2023-04-10 | Disposition: A | Discharge status: Home / Self Care | Payer: Medicare PPO | Attending: Family Medicine | Admitting: Family Medicine

## 2023-04-10 ENCOUNTER — Inpatient Hospital Stay: Payer: Medicare PPO

## 2023-04-10 ENCOUNTER — Encounter: Payer: Self-pay | Admitting: Internal Medicine

## 2023-04-10 ENCOUNTER — Inpatient Hospital Stay: Payer: Medicare PPO | Admitting: Hospice and Palliative Medicine

## 2023-04-10 DIAGNOSIS — T80212A Local infection due to central venous catheter, initial encounter: Secondary | ICD-10-CM | POA: Diagnosis present

## 2023-04-10 DIAGNOSIS — I110 Hypertensive heart disease with heart failure: Secondary | ICD-10-CM | POA: Diagnosis present

## 2023-04-10 DIAGNOSIS — A419 Sepsis, unspecified organism: Secondary | ICD-10-CM | POA: Diagnosis not present

## 2023-04-10 DIAGNOSIS — F05 Delirium due to known physiological condition: Secondary | ICD-10-CM | POA: Diagnosis not present

## 2023-04-10 DIAGNOSIS — K5641 Fecal impaction: Secondary | ICD-10-CM | POA: Diagnosis not present

## 2023-04-10 DIAGNOSIS — L89322 Pressure ulcer of left buttock, stage 2: Secondary | ICD-10-CM | POA: Diagnosis present

## 2023-04-10 DIAGNOSIS — J189 Pneumonia, unspecified organism: Secondary | ICD-10-CM | POA: Diagnosis present

## 2023-04-10 DIAGNOSIS — Z6841 Body Mass Index (BMI) 40.0 and over, adult: Secondary | ICD-10-CM | POA: Diagnosis not present

## 2023-04-10 DIAGNOSIS — I2690 Septic pulmonary embolism without acute cor pulmonale: Secondary | ICD-10-CM | POA: Diagnosis present

## 2023-04-10 DIAGNOSIS — B9562 Methicillin resistant Staphylococcus aureus infection as the cause of diseases classified elsewhere: Secondary | ICD-10-CM | POA: Diagnosis not present

## 2023-04-10 DIAGNOSIS — N179 Acute kidney failure, unspecified: Secondary | ICD-10-CM | POA: Diagnosis present

## 2023-04-10 DIAGNOSIS — I76 Septic arterial embolism: Secondary | ICD-10-CM | POA: Diagnosis not present

## 2023-04-10 DIAGNOSIS — I38 Endocarditis, valve unspecified: Secondary | ICD-10-CM

## 2023-04-10 DIAGNOSIS — Z515 Encounter for palliative care: Secondary | ICD-10-CM | POA: Diagnosis not present

## 2023-04-10 DIAGNOSIS — I739 Peripheral vascular disease, unspecified: Secondary | ICD-10-CM | POA: Diagnosis present

## 2023-04-10 DIAGNOSIS — C7951 Secondary malignant neoplasm of bone: Secondary | ICD-10-CM | POA: Diagnosis present

## 2023-04-10 DIAGNOSIS — R509 Fever, unspecified: Secondary | ICD-10-CM | POA: Diagnosis not present

## 2023-04-10 DIAGNOSIS — Z66 Do not resuscitate: Secondary | ICD-10-CM | POA: Diagnosis not present

## 2023-04-10 DIAGNOSIS — Y95 Nosocomial condition: Secondary | ICD-10-CM

## 2023-04-10 DIAGNOSIS — C61 Malignant neoplasm of prostate: Secondary | ICD-10-CM | POA: Diagnosis present

## 2023-04-10 DIAGNOSIS — J9601 Acute respiratory failure with hypoxia: Secondary | ICD-10-CM | POA: Diagnosis present

## 2023-04-10 DIAGNOSIS — R7881 Bacteremia: Secondary | ICD-10-CM

## 2023-04-10 DIAGNOSIS — R197 Diarrhea, unspecified: Secondary | ICD-10-CM | POA: Diagnosis not present

## 2023-04-10 DIAGNOSIS — A4902 Methicillin resistant Staphylococcus aureus infection, unspecified site: Secondary | ICD-10-CM

## 2023-04-10 DIAGNOSIS — R918 Other nonspecific abnormal finding of lung field: Secondary | ICD-10-CM

## 2023-04-10 DIAGNOSIS — Z1152 Encounter for screening for COVID-19: Secondary | ICD-10-CM | POA: Diagnosis not present

## 2023-04-10 DIAGNOSIS — L89893 Pressure ulcer of other site, stage 3: Secondary | ICD-10-CM | POA: Diagnosis present

## 2023-04-10 DIAGNOSIS — A4102 Sepsis due to Methicillin resistant Staphylococcus aureus: Secondary | ICD-10-CM | POA: Diagnosis present

## 2023-04-10 DIAGNOSIS — R6520 Severe sepsis without septic shock: Principal | ICD-10-CM | POA: Diagnosis present

## 2023-04-10 DIAGNOSIS — D638 Anemia in other chronic diseases classified elsewhere: Secondary | ICD-10-CM | POA: Diagnosis present

## 2023-04-10 DIAGNOSIS — K5981 Ogilvie syndrome: Secondary | ICD-10-CM | POA: Diagnosis not present

## 2023-04-10 DIAGNOSIS — E871 Hypo-osmolality and hyponatremia: Secondary | ICD-10-CM | POA: Diagnosis present

## 2023-04-10 DIAGNOSIS — G9341 Metabolic encephalopathy: Secondary | ICD-10-CM | POA: Diagnosis not present

## 2023-04-10 DIAGNOSIS — I5033 Acute on chronic diastolic (congestive) heart failure: Secondary | ICD-10-CM | POA: Diagnosis not present

## 2023-04-10 LAB — RESPIRATORY PANEL BY PCR

## 2023-04-10 LAB — CBC WITH DIFFERENTIAL/PLATELET
Abs Immature Granulocytes: 0.17 10*3/uL — ABNORMAL HIGH (ref 0.00–0.07)
Basophils Absolute: 0 10*3/uL (ref 0.0–0.1)
Basophils Relative: 0 %
Eosinophils Absolute: 0 10*3/uL (ref 0.0–0.5)
Eosinophils Relative: 0 %
HCT: 31.4 % — ABNORMAL LOW (ref 39.0–52.0)
Hemoglobin: 10.8 g/dL — ABNORMAL LOW (ref 13.0–17.0)
Immature Granulocytes: 1 %
Lymphocytes Relative: 1 %
Lymphs Abs: 0.1 10*3/uL — ABNORMAL LOW (ref 0.7–4.0)
MCH: 31.4 pg (ref 26.0–34.0)
MCHC: 34.4 g/dL (ref 30.0–36.0)
MCV: 91.3 fL (ref 80.0–100.0)
Monocytes Absolute: 0.2 10*3/uL (ref 0.1–1.0)
Monocytes Relative: 1 %
Neutro Abs: 15.7 10*3/uL — ABNORMAL HIGH (ref 1.7–7.7)
Neutrophils Relative %: 97 %
Platelets: 229 10*3/uL (ref 150–400)
RBC: 3.44 MIL/uL — ABNORMAL LOW (ref 4.22–5.81)
RDW: 14.6 % (ref 11.5–15.5)
WBC: 16.3 10*3/uL — ABNORMAL HIGH (ref 4.0–10.5)
nRBC: 0 % (ref 0.0–0.2)

## 2023-04-10 LAB — URINALYSIS, W/ REFLEX TO CULTURE (INFECTION SUSPECTED)
Bilirubin Urine: NEGATIVE
Glucose, UA: NEGATIVE mg/dL
Ketones, ur: NEGATIVE mg/dL
Nitrite: NEGATIVE
Protein, ur: NEGATIVE mg/dL
Specific Gravity, Urine: 1.016 (ref 1.005–1.030)
pH: 5 (ref 5.0–8.0)

## 2023-04-10 LAB — PROTIME-INR
INR: 2.9 — ABNORMAL HIGH (ref 0.8–1.2)
Prothrombin Time: 30.6 s — ABNORMAL HIGH (ref 11.4–15.2)

## 2023-04-10 LAB — BLOOD CULTURE ID PANEL (REFLEXED) - BCID2

## 2023-04-10 LAB — COMPREHENSIVE METABOLIC PANEL
ALT: 22 U/L (ref 0–44)
AST: 44 U/L — ABNORMAL HIGH (ref 15–41)
Albumin: 3.3 g/dL — ABNORMAL LOW (ref 3.5–5.0)
Alkaline Phosphatase: 67 U/L (ref 38–126)
Anion gap: 13 (ref 5–15)
BUN: 63 mg/dL — ABNORMAL HIGH (ref 8–23)
CO2: 20 mmol/L — ABNORMAL LOW (ref 22–32)
Calcium: 9.5 mg/dL (ref 8.9–10.3)
Chloride: 96 mmol/L — ABNORMAL LOW (ref 98–111)
Creatinine, Ser: 3.11 mg/dL — ABNORMAL HIGH (ref 0.61–1.24)
GFR, Estimated: 21 mL/min — ABNORMAL LOW (ref >60)
Glucose, Bld: 171 mg/dL — ABNORMAL HIGH (ref 70–99)
Potassium: 3.2 mmol/L — ABNORMAL LOW (ref 3.5–5.1)
Sodium: 129 mmol/L — ABNORMAL LOW (ref 135–145)
Total Bilirubin: 2.1 mg/dL — ABNORMAL HIGH (ref 0.0–1.2)
Total Protein: 6.9 g/dL (ref 6.5–8.1)

## 2023-04-10 LAB — C DIFFICILE QUICK SCREEN W PCR REFLEX
C Diff antigen: NEGATIVE
C Diff interpretation: NOT DETECTED
C Diff toxin: NEGATIVE

## 2023-04-10 LAB — ECHOCARDIOGRAM COMPLETE
Height: 58 in
S' Lateral: 3.4 cm
Weight: 4797.21 [oz_av]

## 2023-04-10 LAB — LACTIC ACID, PLASMA
Lactic Acid, Venous: 1.5 mmol/L (ref 0.5–1.9)
Lactic Acid, Venous: 1.5 mmol/L (ref 0.5–1.9)

## 2023-04-10 LAB — MRSA NEXT GEN BY PCR, NASAL: MRSA by PCR Next Gen: DETECTED — AB

## 2023-04-10 LAB — APTT: aPTT: 55 s — ABNORMAL HIGH (ref 24–36)

## 2023-04-10 MED ORDER — GABAPENTIN 300 MG PO CAPS
600.0000 mg | ORAL_CAPSULE | Freq: Three times a day (TID) | ORAL | Status: DC
  Administered 2023-04-10 – 2023-04-13 (×10): 600 mg via ORAL
  Filled 2023-04-10 (×10): qty 2

## 2023-04-10 MED ORDER — IPRATROPIUM-ALBUTEROL 0.5-2.5 (3) MG/3ML IN SOLN
3.0000 mL | RESPIRATORY_TRACT | Status: DC
  Administered 2023-04-10 – 2023-04-14 (×24): 3 mL via RESPIRATORY_TRACT
  Filled 2023-04-10 (×23): qty 3

## 2023-04-10 MED ORDER — WARFARIN SODIUM 6 MG PO TABS
6.0000 mg | ORAL_TABLET | Freq: Once | ORAL | Status: AC
  Administered 2023-04-10: 6 mg via ORAL
  Filled 2023-04-10: qty 1

## 2023-04-10 MED ORDER — ONDANSETRON HCL 4 MG PO TABS: 8.0000 mg | ORAL_TABLET | Freq: Three times a day (TID) | ORAL | Status: DC | PRN

## 2023-04-10 MED ORDER — POTASSIUM CHLORIDE CRYS ER 20 MEQ PO TBCR
40.0000 meq | EXTENDED_RELEASE_TABLET | Freq: Once | ORAL | Status: AC
  Administered 2023-04-10: 40 meq via ORAL
  Filled 2023-04-10: qty 2

## 2023-04-10 MED ORDER — VANCOMYCIN HCL 1500 MG/300ML IV SOLN
1500.0000 mg | Freq: Once | INTRAVENOUS | Status: AC
  Administered 2023-04-10: 1500 mg via INTRAVENOUS
  Filled 2023-04-10: qty 300

## 2023-04-10 MED ORDER — LISINOPRIL 10 MG PO TABS
10.0000 mg | ORAL_TABLET | Freq: Every day | ORAL | Status: DC
  Administered 2023-04-10 – 2023-04-13 (×4): 10 mg via ORAL
  Filled 2023-04-10 (×4): qty 1

## 2023-04-10 MED ORDER — WARFARIN SODIUM 7.5 MG PO TABS
7.5000 mg | ORAL_TABLET | Freq: Every day | ORAL | Status: DC
Start: 2023-04-10 — End: 2023-04-10

## 2023-04-10 MED ORDER — VANCOMYCIN VARIABLE DOSE PER UNSTABLE RENAL FUNCTION (PHARMACIST DOSING)
Status: DC
Start: 2023-04-10 — End: 2023-04-12

## 2023-04-10 MED ORDER — FUROSEMIDE 40 MG PO TABS: 20.0000 mg | ORAL_TABLET | Freq: Two times a day (BID) | ORAL | Status: DC

## 2023-04-10 MED ORDER — FUROSEMIDE 20 MG PO TABS
20.0000 mg | ORAL_TABLET | Freq: Two times a day (BID) | ORAL | Status: DC
  Administered 2023-04-10 – 2023-04-13 (×7): 20 mg via ORAL
  Filled 2023-04-10 (×8): qty 1

## 2023-04-10 MED ORDER — LACTULOSE 10 GM/15ML PO SOLN: 20.0000 g | Freq: Two times a day (BID) | ORAL | Status: DC | PRN

## 2023-04-10 MED ORDER — LACTATED RINGERS IV SOLN
150.0000 mL/h | INTRAVENOUS | Status: AC
  Administered 2023-04-10: 150 mL/h via INTRAVENOUS

## 2023-04-10 MED ORDER — POLYETHYLENE GLYCOL 3350 17 GM/SCOOP PO POWD: 17.0000 g | Freq: Two times a day (BID) | ORAL | Status: DC

## 2023-04-10 MED ORDER — BUPROPION HCL ER (XL) 150 MG PO TB24
300.0000 mg | ORAL_TABLET | Freq: Every day | ORAL | Status: DC
  Administered 2023-04-10 – 2023-04-16 (×7): 300 mg via ORAL
  Filled 2023-04-10 (×7): qty 2

## 2023-04-10 MED ORDER — WARFARIN - PHARMACIST DOSING INPATIENT
Freq: Every day | Status: DC
  Filled 2023-04-10: qty 1

## 2023-04-10 MED ORDER — SODIUM CHLORIDE 0.9% FLUSH: 3.0000 mL | Freq: Two times a day (BID) | INTRAVENOUS | Status: DC

## 2023-04-10 MED ORDER — SODIUM CHLORIDE 0.9 % IV SOLN: 250.0000 mL | INTRAVENOUS | Status: DC | PRN

## 2023-04-10 MED ORDER — SODIUM CHLORIDE 0.9 % IV SOLN: INTRAVENOUS | Status: AC

## 2023-04-10 MED ORDER — POLYETHYLENE GLYCOL 3350 17 G PO PACK
17.0000 g | PACK | Freq: Two times a day (BID) | ORAL | Status: DC
  Administered 2023-04-12 – 2023-04-17 (×6): 17 g via ORAL
  Filled 2023-04-10 (×12): qty 1

## 2023-04-10 MED ORDER — DIPHENOXYLATE-ATROPINE 2.5-0.025 MG PO TABS
1.0000 | ORAL_TABLET | Freq: Four times a day (QID) | ORAL | Status: DC | PRN
  Administered 2023-04-10 – 2023-04-11 (×3): 1 via ORAL
  Filled 2023-04-10 (×3): qty 1

## 2023-04-10 MED ORDER — METHYLPREDNISOLONE SODIUM SUCC 125 MG IJ SOLR
80.0000 mg | Freq: Every day | INTRAMUSCULAR | Status: DC
  Administered 2023-04-10 – 2023-04-12 (×3): 80 mg via INTRAVENOUS
  Filled 2023-04-10 (×3): qty 2

## 2023-04-10 MED ORDER — OXYCODONE-ACETAMINOPHEN 5-325 MG PO TABS
1.0000 | ORAL_TABLET | Freq: Two times a day (BID) | ORAL | Status: DC | PRN
  Administered 2023-04-10 – 2023-04-12 (×3): 1 via ORAL
  Filled 2023-04-10 (×3): qty 1

## 2023-04-10 MED ORDER — MEDIHONEY WOUND/BURN DRESSING EX PSTE
1.0000 | PASTE | Freq: Every day | CUTANEOUS | Status: DC
  Administered 2023-04-10 – 2023-04-26 (×17): 1 via TOPICAL
  Filled 2023-04-10 (×5): qty 44

## 2023-04-10 MED ORDER — VANCOMYCIN HCL IN DEXTROSE 1-5 GM/200ML-% IV SOLN: 1000.0000 mg | Freq: Once | INTRAVENOUS | Status: DC

## 2023-04-10 MED ORDER — SODIUM CHLORIDE 0.9% FLUSH: 3.0000 mL | INTRAVENOUS | Status: DC | PRN

## 2023-04-10 NOTE — ED Notes (Signed)
 Patient placed on bedpan and x1 loose watery BM. Patient cleaned and repositioned.

## 2023-04-10 NOTE — Assessment & Plan Note (Signed)
 BP is stable at 114/67  Plan Continue home regimen

## 2023-04-10 NOTE — ED Notes (Signed)
CCMD contacted and admitted

## 2023-04-10 NOTE — Progress Notes (Addendum)
 Pharmacy Antibiotic Note  [name] is a 68 y.o. male admitted on 04/09/2023 with MRSA bacteremia.  Pharmacy has been consulted for Vancomycin  dosing.  Patient with recent admission for recurrent DVT, discharged 1/31.  Comes to ED 2/4 with confusion and shortness of breath.  CT chest/abdomen/pelvis lung findings concerning for septic pulmonary emboli.  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   Today, 04/10/2023 Day #1 vancomycin  Renal: AKI - SCr 3.11 (trending down), recent admit SCr was < 1 mg/dl Tm 898.0 WBC 83.6 2/4 blood culture: 1/4 bottles GPC, BCID detects MRSA 2/5 Urine cx Respiratory panel and respiratory virus panel neg Rule out endocarditis with concern for septic pulmonary embolism on CT  Vancomycin  dosing/levels: Vancomycin  doses given 2/4 1gm IV x 1 at 2356 then 2/5 1500mg  IV  x1  Plan: No further vancomycin  dose at this time.  Plan is to check random vancomycin  level morning of 2/6.  Based on current CrCl may not need dose that soon but since SCr was recently WNL want to ensure renal function does not quickly improved with hydration.   Note history of infusion related reaction with vancomycin  - slow infusion on future doses Follow renal function closely Follow current cultures, ECHO, repeat blood cultures ID consult  Temp (24hrs), Avg:99.9 F (37.7 C), Min:98.7 F (37.1 C), Max:101.9 F (38.8 C)   Recent Labs  Lab 04/04/23 0520 04/05/23 0451 04/09/23 2025 04/09/23 2132 04/10/23 0127 04/10/23 0350 04/10/23 1051  WBC 2.7* 3.5* 20.0*  --   --   --  16.3*  CREATININE 0.78 0.98 3.89*  --   --   --  3.11*  LATICACIDVEN  --   --   --  1.6 1.5 1.5  --     Estimated Creatinine Clearance: 26.6 mL/min (A) (by C-G formula based on SCr of 3.11 mg/dL (H)).    Allergies  Allergen Reactions   Bacitracin Hives    Vancomycin  Other (See Comments) and Rash    Red man syndrome.   Ketorolac     Other Reaction(s): Kidney Disorder  Other reaction(s): Kidney Disorder   Spironolactone     Other Reaction(s): Unknown  Other reaction(s): Unknown    Antimicrobials this admission: 2/4 Cefepime  >> x 1 dose 2/4 Vancomycin  >>    Thank you for allowing pharmacy to be a part of this patient's care.  Jakwan Sally, PharmD, BCPS, BCIDP Work Cell: [phone] 04/10/2023 1:31 PM

## 2023-04-10 NOTE — ED Notes (Signed)
 Dr Marquette Sites notified of pt continued labored breathing and difficulty speaking with minimal movement after duoneb administered.

## 2023-04-10 NOTE — ED Notes (Signed)
 Pt assisted on bed pan. RR labored and difficulty speaking with minimal movement. Dr Dezii to be notified.

## 2023-04-10 NOTE — ED Notes (Signed)
 Pt placed on bed pan as requested. RR labored, difficulty speaking. RT contacted to assess

## 2023-04-10 NOTE — TOC CM/SW Note (Signed)
 CSW noted TOC consult; however, CSW unable to complete consult until PT/OT complete assessment and make recommendations. CSW asked attending to re-consult TOC once PT/OT assess and make recs. CSW closing consult.

## 2023-04-10 NOTE — ED Notes (Addendum)
 Attempted in and out cath - unsuccessful. Bladder scan revealed >625mL.

## 2023-04-10 NOTE — ED Notes (Signed)
Pt placed on bedpan as requested

## 2023-04-10 NOTE — ED Notes (Signed)
 Pt noted to have wheezing and increased WOB with minimal movement. RT to bedside and access, recommend breathing treatments, will notify Dr Dezii

## 2023-04-10 NOTE — ED Notes (Signed)
 Pt transitioned to [place] by RT

## 2023-04-10 NOTE — Progress Notes (Signed)
*  PRELIMINARY RESULTS* Echocardiogram 2D Echocardiogram has been performed.  [name] 04/10/2023, 2:09 PM

## 2023-04-10 NOTE — ED Notes (Signed)
 Received call from [facility] with [facility] hospice stating was attempting to get pt admitted today, left number in case need assistance with discharge [phone]

## 2023-04-10 NOTE — Assessment & Plan Note (Signed)
 Patient had recent recurrent PE - see d/c summary 03/30/23.With this illness he does have adequate oxygenation.  Plan Continue Warfarin - pharmacy to consult

## 2023-04-10 NOTE — ED Notes (Signed)
 [place] 4L placed. Patient continuing to remove bi-pap.

## 2023-04-10 NOTE — Progress Notes (Signed)
 Pharmacy Antibiotic Note  [name] is a 68 y.o. male admitted on 04/09/2023 with pneumonia.  Pharmacy has been consulted for Vancomycin  dosing for 7 days.  Plan: Pt given Vancomycin  2500 mg once per pt TBW: 136 kg. AKI :SCr: 0.98 (04/05/23) >> 3.89  (2/4). Vancomycin  variable dosing by pharmacy. Pharmacy will continue to follow and will order additional abx doses when warranted.  Temp (24hrs), Avg:101.9 F (38.8 C), Min:101.9 F (38.8 C), Max:101.9 F (38.8 C)   Recent Labs  Lab 04/03/23 0517 04/04/23 0520 04/05/23 0451 04/09/23 2025 04/09/23 2132 04/10/23 0127  WBC 2.5* 2.7* 3.5* 20.0*  --   --   CREATININE 0.75 0.78 0.98 3.89*  --   --   LATICACIDVEN  --   --   --   --  1.6 1.5    Estimated Creatinine Clearance: 21.3 mL/min (A) (by C-G formula based on SCr of 3.89 mg/dL (H)).    Allergies  Allergen Reactions   Bacitracin Hives   Vancomycin  Other (See Comments) and Rash    Red man syndrome.   Ketorolac     Other Reaction(s): Kidney Disorder  Other reaction(s): Kidney Disorder   Spironolactone     Other Reaction(s): Unknown  Other reaction(s): Unknown    Antimicrobials this admission: 2/4 Cefepime  >> x 1 dose 2/4 Vancomycin  >> x 7 days  Microbiology results: 2/4 BCx: Pending  Thank you for allowing pharmacy to be a part of this patient's care.  Rankin CANDIE Dills, PharmD, MBA 04/10/2023 3:09 AM

## 2023-04-10 NOTE — ED Notes (Signed)
 Humidity placed to wall supply O2.

## 2023-04-10 NOTE — ED Notes (Signed)
Patient cleaned from loose watery stool.

## 2023-04-10 NOTE — Consult Note (Signed)
 NAME: [name]  DOB: 1955-11-06  MRN: [id number]  Date/Time: 04/10/2023 2:20 PM  REQUESTING PROVIDER: Dr.Dezii Subjective:  REASON FOR CONSULT: MRSA bacteremia ? [name] is a 68 y.o. 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  Temp 101.9 F (38.8 C) !  Pulse Rate 79  Resp 21 !  SpO2 96 %  O2 Flow Rate (L/min) 4 L/min  Weight 299 lb 13.2 oz    Latest Reference Range & Units 04/09/23  WBC 4.0 - 10.5 K/uL 20.0 (H)  Hemoglobin 13.0 - 17.0 g/dL 89.0 (L)  HCT 60.9 - 47.9 % 31.4 (L)  Platelets 150 - 400 K/uL 299  Creatinine 0.61 - 1.24 mg/dL 6.10 (H)  Blood culture sent CT abdomen pelvis and chest was done and that showed multiple peripheral rounded masses within the lungs measuring up to 21 mm and some demonstrating central cavitation and groundglass suspicious for septic emboli.  Numerous sclerotic metastasis were seen throughout the visualized axial skeleton.  Cholelithiasis was seen.  The distal colon and rectum were fluid-filled Has blood culture positive for MRSA I am seeing the patient. Patient has BiPAP but is very uncomfortable with the mask and he wants to take it out.  He lives on the on his own .has shortness of breath and fever Has had diarrhea.  Past Medical History:  Diagnosis Date   Depression    History of blood clots 08/13/2011   History of left knee replacement    HTN (hypertension)    Lymphedema    lt leg-per pt   PE  (pulmonary thromboembolism) (HCC)    Pressure injury of skin, unspecified injury stage, unspecified location    S/P AKA (above knee amputation) bilateral (HCC)    rt 11/24/07 and lt 01/11/17    Past Surgical History:  Procedure Laterality Date   ACHILLES TENDON SURGERY Left    per pt   BIOPSY  04/03/2023   Procedure: BIOPSY;  Surgeon: Jinny Carmine, MD;  Location: [facility] ENDOSCOPY;  Service: Endoscopy;;   bone clip removal Left 10/04/2015   knee   CARDIAC SURGERY  11/2012   cath.-per pt   CERVICAL FUSION  07/28/2015   per pt   COLONOSCOPY WITH PROPOFOL  N/A 04/03/2023   Procedure: COLONOSCOPY WITH PROPOFOL ;  Surgeon: Jinny Carmine, MD;  Location: [facility] ENDOSCOPY;  Service: Endoscopy;  Laterality: N/A;   EPIGASTRIC HERNIA REPAIR     per pt   hematoma removal Left 11/02/2015   knee   HIP SURGERY Right    x8 per pt   IR IMAGING GUIDED PORT INSERTION  11/14/2022   KNEE FUSION Right    KNEE JOINT MANIPULATION Left 09/11/2011   KNEE SURGERY Left    x3   leg stump Left 03/2017   leg surgery-per pt   NECK SURGERY  02/2015   plates-per pt   REPLACEMENT TOTAL KNEE Right    REPLACEMENT TOTAL KNEE Left 07/02/2011   again in 07/10/2011 per pt   s/p  of bilateral AKA Right    screen filter removal  10/20/2008   for embolism per pt   screen filter replacement     x3   SHOULDER SURGERY Right    x2-per pt   SPINE SURGERY  02/11/2015   vertebra spacer-per pt   toe nail removal Right    per pt   VARICOSE VEIN SURGERY     x5-per pt    Social History   Socioeconomic History   Marital status: Single    Spouse name: Not on file   Number of children: Not on file   Years of education: Not on file   Highest education level: Not on file  Occupational History   Not on file  Tobacco Use   Smoking status: Never   Smokeless tobacco: Former  Psychologist, Educational Use   Vaping status: Never Used  Substance and Sexual Activity   Alcohol use: Yes    Alcohol/week: 3.0 standard drinks of alcohol    Types: 3  Glasses of wine per week   Drug use: Never   Sexual activity: Not on file  Other Topics Concern   Not on file  Social History Narrative   Not on file   Social Drivers of Health   Financial Resource Strain: High Risk (07/28/2021)   Received from [facility], [facility]   Overall Financial Resource Strain (CARDIA)    Difficulty of Paying Living Expenses: Very hard  Food Insecurity: No Food Insecurity (03/25/2023)   Hunger Vital Sign    Worried About Running Out of Food in the Last Year: Never true    Ran Out of Food in the Last Year: Never true  Transportation Needs: No Transportation Needs (03/25/2023)   PRAPARE - Administrator, Civil Service (Medical): No    Lack of Transportation (Non-Medical): No  Physical Activity: Sufficiently Active (07/28/2021)   Received from [facility], [facility]   Exercise Vital Sign    Days of Exercise per Week: 7 days    Minutes of Exercise per Session: 60 min  Stress: Stress Concern Present (08/01/2021)   Received from [facility], [facility]   [employer] of Occupational Health - Occupational Stress Questionnaire    Feeling of Stress : Very much  Social Connections: Moderately Integrated (03/25/2023)   Social Connection and Isolation Panel [NHANES]    Frequency of Communication with Friends and Family: More than three times a week    Frequency of Social Gatherings with Friends and Family: Twice a week    Attends Religious Services: 1 to 4 times per year    Active Member of [employer] or Organizations: No    Attends Engineer, Structural: 1 to 4 times per year    Marital Status: Never married  Intimate Partner Violence: Not At Risk (03/25/2023)   Humiliation, Afraid, Rape, and Kick questionnaire    Fear of Current or Ex-Partner: No    Emotionally Abused: No    Physically Abused: No    Sexually Abused: No    Family  History  Problem Relation Age of Onset   Alzheimer's disease Mother    Lung cancer Father    Allergies  Allergen Reactions   Bacitracin Hives   Vancomycin  Other (See Comments) and Rash    Red man syndrome.   Ketorolac     Other Reaction(s): Kidney Disorder  Other reaction(s): Kidney Disorder   Spironolactone  Other Reaction(s): Unknown  Other reaction(s): Unknown   I? Current Facility-Administered Medications  Medication Dose Route Frequency Provider Last Rate Last Admin   0.9 %  sodium chloride  infusion   Intravenous Continuous Norins, Michael E, MD       buPROPion  (WELLBUTRIN  XL) 24 hr tablet 300 mg  300 mg Oral Daily Norins, Michael E, MD   300 mg at 04/10/23 0845   diphenoxylate -atropine  (LOMOTIL ) 2.5-0.025 MG per tablet 1 tablet  1 tablet Oral QID PRN Norins, Michael E, MD       furosemide  (LASIX ) tablet 20 mg  20 mg Oral BID Belue, Nathan S, RPH   20 mg at 04/10/23 0845   gabapentin  (NEURONTIN ) capsule 600 mg  600 mg Oral TID Norins, Michael E, MD   600 mg at 04/10/23 0845   ipratropium-albuterol  (DUONEB) 0.5-2.5 (3) MG/3ML nebulizer solution 3 mL  3 mL Nebulization Q4H Dezii, Alexandra, DO   3 mL at 04/10/23 1225   lactated ringers  infusion  150 mL/hr Intravenous Continuous Norins, Michael E, MD 150 mL/hr at 04/10/23 0134 150 mL/hr at 04/10/23 0134   lactulose  (CHRONULAC ) 10 GM/15ML solution 20 g  20 g Oral BID PRN Norins, Michael E, MD       leptospermum manuka honey (MEDIHONEY) paste 1 Application  1 Application Topical Daily Norins, Michael E, MD   1 Application at 04/10/23 1010   lisinopril  (ZESTRIL ) tablet 10 mg  10 mg Oral Daily Norins, Michael E, MD   10 mg at 04/10/23 0845   methylPREDNISolone  sodium succinate (SOLU-MEDROL ) 125 mg/2 mL injection 80 mg  80 mg Intravenous Q0600 Norins, Michael E, MD   80 mg at 04/10/23 0133   ondansetron  (ZOFRAN ) tablet 8 mg  8 mg Oral Q8H PRN Norins, Michael E, MD       oxyCODONE -acetaminophen  (PERCOCET/ROXICET) 5-325 MG per tablet  1 tablet  1 tablet Oral Q12H PRN Norins, Michael E, MD   1 tablet at 04/10/23 0331   polyethylene glycol (MIRALAX  / GLYCOLAX ) packet 17 g  17 g Oral BID Belue, Nathan S, RPH       vancomycin  variable dose per unstable renal function (pharmacist dosing)   Does not apply See admin instructions Dail Rankin RAMAN, RPH       warfarin (COUMADIN ) tablet 6 mg  6 mg Oral ONCE-1600 Lenon Elsie HERO, [facility]       Warfarin - Pharmacist Dosing Inpatient   Does not apply q1600 Lenon Elsie HERO, [facility]       Current Outpatient Medications  Medication Sig Dispense Refill   buPROPion  (WELLBUTRIN  XL) 300 MG 24 hr tablet Take 300 mg by mouth daily.     [Paused] darolutamide  (NUBEQA ) 300 MG tablet Take 2 tablets (600 mg total) by mouth 2 (two) times daily with a meal. 120 tablet 1   diphenoxylate -atropine  (LOMOTIL ) 2.5-0.025 MG tablet Take 1 tablet by mouth 4 (four) times daily as needed for diarrhea or loose stools. 30 tablet 0   gabapentin  (NEURONTIN ) 300 MG capsule Take 2 capsules (600 mg total) by mouth 3 (three) times daily. 180 capsule 3   leptospermum manuka honey (MEDIHONEY) PSTE paste Apply 1 Application topically daily. 30 mL 2   lidocaine -prilocaine  (EMLA ) cream Apply 1 Application topically as needed. 30 g 0   lisinopril  (ZESTRIL ) 10 MG tablet Take 1 tablet (10 mg total) by mouth daily. 30 tablet 0   ondansetron  (ZOFRAN ) 8 MG tablet Take 1 tablet (8 mg total) by mouth every 8 (eight) hours as needed  for nausea or vomiting. 60 tablet 1   oxyCODONE -acetaminophen  (PERCOCET/ROXICET) 5-325 MG tablet Take 1 tablet by mouth every 12 (twelve) hours as needed for moderate pain (pain score 4-6). 60 tablet 0   polyethylene glycol powder (GLYCOLAX /MIRALAX ) 17 GM/SCOOP powder Take 17 g by mouth 2 (two) times daily. Mix as directed. 238 g 0   psyllium (HYDROCIL/METAMUCIL) 95 % PACK Take 1 packet by mouth daily. 30 packet 0   senna-docusate (SENOKOT-S) 8.6-50 MG tablet Take 2 tablets by mouth 2 (two) times daily as needed  for mild constipation. 100 tablet 0   warfarin (COUMADIN ) 5 MG tablet Take 5-7.5 mg by mouth daily.     warfarin (COUMADIN ) 5 MG tablet Take 5 mg by mouth daily at 4 PM.     furosemide  (LASIX ) 20 MG tablet Take 1 tablet (20 mg total) by mouth 2 (two) times daily. 60 tablet 0   lactulose  (CHRONULAC ) 10 GM/15ML solution Take 30 mLs (20 g total) by mouth 2 (two) times daily as needed for mild constipation. 236 mL 0     Abtx:  Anti-infectives (From admission, onward)    Start     Dose/Rate Route Frequency Ordered Stop   04/10/23 0308  vancomycin  variable dose per unstable renal function (pharmacist dosing)         Does not apply See admin instructions 04/10/23 0308     04/10/23 0145  vancomycin  (VANCOREADY) IVPB 1500 mg/300 mL        1,500 mg 100 mL/hr over 180 Minutes Intravenous  Once 04/10/23 0131 04/10/23 0546   04/10/23 0130  vancomycin  (VANCOCIN ) IVPB 1000 mg/200 mL premix  Status:  Discontinued        1,000 mg 200 mL/hr over 60 Minutes Intravenous  Once 04/10/23 0120 04/10/23 0131   04/09/23 2100  vancomycin  (VANCOCIN ) IVPB 1000 mg/200 mL premix        1,000 mg 200 mL/hr over 60 Minutes Intravenous  Once 04/09/23 2047 04/10/23 0113   04/09/23 2100  ceFEPIme  (MAXIPIME ) 2 g in sodium chloride  0.9 % 100 mL IVPB        2 g 200 mL/hr over 30 Minutes Intravenous  Once 04/09/23 2047 04/10/23 0113       REVIEW OF SYSTEMS:  Const:  fever, negative chills, negative weight loss Eyes: negative diplopia or visual changes, negative eye pain ENT: negative coryza, negative sore throat Resp: negative cough, hemoptysis, has severe dyspnea Cards: negative for chest pain, palpitations, lower extremity edema GU: negative for frequency, dysuria and hematuria GI: Negative for abdominal pain, ++diarrhea, bleeding, constipation Skin: Chronic gluteal and upper posterior thigh skin maceration Heme: negative for easy bruising and gum/nose bleeding MS: b/l aka Neurolo:negative for headaches, dizziness,  vertigo, memory problems  Psych:  anxiety, depression  Endocrine: negative for thyroid, diabetes Allergy/Immunology- as above Objective:  VITALS:  BP 107/61   Pulse 90   Temp 98.3 F (36.8 C)   Resp (!) 26   Ht 4' 10 (1.473 m)   Wt 136 kg   SpO2 95%   BMI 62.66 kg/m  LDA PORT PHYSICAL EXAM:  General: Awake, sob, morbid obesity  Head: Normocephalic, without obvious abnormality, atraumatic. Eyes: Conjunctivae clear, anicteric sclerae. Pupils are equal ENT Nares normal. No drainage or sinus tenderness. Lips, mucosa, and tongue normal. No Thrush Neck:  symmetrical, no adenopathy, thyroid: non tender no carotid bruit and no JVD. Port  Back:      Lungs: b/l air entry Crepts, rhonchi Heart: Tachycardia Abdomen: Soft,  distended.  Bowel sounds normal. No masses Extremities: b/l aka Skin: as above Lymph: Cervical, supraclavicular normal. Neurologic: Grossly non-focal Pertinent Labs Lab Results CBC    Component Value Date/Time   WBC 16.3 (H) 04/10/2023 1051   RBC 3.44 (L) 04/10/2023 1051   HGB 10.8 (L) 04/10/2023 1051   HGB 8.9 (L) 02/25/2023 0820   HCT 31.4 (L) 04/10/2023 1051   PLT 229 04/10/2023 1051   PLT 193 02/25/2023 0820   MCV 91.3 04/10/2023 1051   MCH 31.4 04/10/2023 1051   MCHC 34.4 04/10/2023 1051   RDW 14.6 04/10/2023 1051   LYMPHSABS 0.1 (L) 04/10/2023 1051   MONOABS 0.2 04/10/2023 1051   EOSABS 0.0 04/10/2023 1051   BASOSABS 0.0 04/10/2023 1051       Latest Ref Rng & Units 04/10/2023   10:51 AM 04/09/2023    8:25 PM 04/05/2023    4:51 AM  CMP  Glucose 70 - 99 mg/dL 828  882  882   BUN 8 - 23 mg/dL 63  57  15   Creatinine 0.61 - 1.24 mg/dL 6.88  6.10  9.01   Sodium 135 - 145 mmol/L 129  125  140   Potassium 3.5 - 5.1 mmol/L 3.2  3.3  3.0   Chloride 98 - 111 mmol/L 96  90  102   CO2 22 - 32 mmol/L 20  21  28    Calcium 8.9 - 10.3 mg/dL 9.5  9.2  9.5   Total Protein 6.5 - 8.1 g/dL 6.9  7.2    Total Bilirubin 0.0 - 1.2 mg/dL 2.1  2.6     Alkaline Phos 38 - 126 U/L 67  59    AST 15 - 41 U/L 44  48    ALT 0 - 44 U/L 22  22        Microbiology: Recent Results (from the past 240 hours)  Resp panel by RT-PCR (RSV, Flu A&B, Covid) Anterior Nasal Swab     Status: None   Collection Time: 04/09/23  8:19 PM   Specimen: Anterior Nasal Swab  Result Value Ref Range Status   SARS Coronavirus 2 by RT PCR NEGATIVE NEGATIVE Final    Comment: (NOTE) SARS-CoV-2 target nucleic acids are NOT DETECTED.  The SARS-CoV-2 RNA is generally detectable in upper respiratory specimens during the acute phase of infection. The lowest concentration of SARS-CoV-2 viral copies this assay can detect is 138 copies/mL. A negative result does not preclude SARS-Cov-2 infection and should not be used as the sole basis for treatment or other patient management decisions. A negative result may occur with  improper specimen collection/handling, submission of specimen other than nasopharyngeal swab, presence of viral mutation(s) within the areas targeted by this assay, and inadequate number of viral copies(<138 copies/mL). A negative result must be combined with clinical observations, patient history, and epidemiological information. The expected result is Negative.  Fact Sheet for Patients:  bloggercourse.com  Fact Sheet for Healthcare Providers:  seriousbroker.it  This test is no t yet approved or cleared by the [place]  FDA and  has been authorized for detection and/or diagnosis of SARS-CoV-2 by FDA under an Emergency Use Authorization (EUA). This EUA will remain  in effect (meaning this test can be used) for the duration of the COVID-19 declaration under Section 564(b)(1) of the Act, 21 U.S.C.section 360bbb-3(b)(1), unless the authorization is terminated  or revoked sooner.       Influenza A by PCR NEGATIVE NEGATIVE Final   Influenza B by PCR NEGATIVE  NEGATIVE Final    Comment:  (NOTE) The Xpert Xpress SARS-CoV-2/FLU/RSV plus assay is intended as an aid in the diagnosis of influenza from Nasopharyngeal swab specimens and should not be used as a sole basis for treatment. Nasal washings and aspirates are unacceptable for Xpert Xpress SARS-CoV-2/FLU/RSV testing.  Fact Sheet for Patients: bloggercourse.com  Fact Sheet for Healthcare Providers: seriousbroker.it  This test is not yet approved or cleared by the [place]  FDA and has been authorized for detection and/or diagnosis of SARS-CoV-2 by FDA under an Emergency Use Authorization (EUA). This EUA will remain in effect (meaning this test can be used) for the duration of the COVID-19 declaration under Section 564(b)(1) of the Act, 21 U.S.C. section 360bbb-3(b)(1), unless the authorization is terminated or revoked.     Resp Syncytial Virus by PCR NEGATIVE NEGATIVE Final    Comment: (NOTE) Fact Sheet for Patients: bloggercourse.com  Fact Sheet for Healthcare Providers: seriousbroker.it  This test is not yet approved or cleared by the [place]  FDA and has been authorized for detection and/or diagnosis of SARS-CoV-2 by FDA under an Emergency Use Authorization (EUA). This EUA will remain in effect (meaning this test can be used) for the duration of the COVID-19 declaration under Section 564(b)(1) of the Act, 21 U.S.C. section 360bbb-3(b)(1), unless the authorization is terminated or revoked.  Performed at [facility], [address], [place], [place] [address]   Blood Culture (routine x 2)     Status: None (Preliminary result)   Collection Time: 04/09/23  8:19 PM   Specimen: Right Antecubital; Blood  Result Value Ref Range Status   Specimen Description RIGHT ANTECUBITAL  Final   Special Requests   Final    BOTTLES DRAWN AEROBIC AND ANAEROBIC Blood Culture results may not be optimal due  to an inadequate volume of blood received in culture bottles   Culture  Setup Time   Final    GRAM POSITIVE COCCI AEROBIC BOTTLE ONLY Organism ID to follow CRITICAL RESULT CALLED TO, READ BACK BY AND VERIFIED WITH: MOSE BLEW PHARMD [id number] 04/10/23 HNM    Culture   Final    NO GROWTH < 12 HOURS Performed at [facility], [address], [place], [place] [address]    Report Status PENDING  Incomplete  Blood Culture (routine x 2)     Status: None (Preliminary result)   Collection Time: 04/09/23  8:19 PM   Specimen: BLOOD RIGHT FOREARM  Result Value Ref Range Status   Specimen Description BLOOD RIGHT FOREARM  Final   Special Requests   Final    BOTTLES DRAWN AEROBIC AND ANAEROBIC Blood Culture results may not be optimal due to an inadequate volume of blood received in culture bottles   Culture   Final    NO GROWTH < 12 HOURS Performed at [facility], [address] [place], [place] [address]    Report Status PENDING  Incomplete  Blood Culture ID Panel (Reflexed)     Status: Abnormal   Collection Time: 04/09/23  8:19 PM  Result Value Ref Range Status   Enterococcus faecalis NOT DETECTED NOT DETECTED Final   Enterococcus Faecium NOT DETECTED NOT DETECTED Final   Listeria monocytogenes NOT DETECTED NOT DETECTED Final   Staphylococcus species DETECTED (A) NOT DETECTED Final    Comment: CRITICAL RESULT CALLED TO, READ BACK BY AND VERIFIED WITH: MOSE BLEW PHARMD 1137 04/10/23 HNM    Staphylococcus aureus (BCID) DETECTED (A) NOT DETECTED Final    Comment: Methicillin (oxacillin)-resistant  Staphylococcus aureus (MRSA). MRSA is predictably resistant to beta-lactam antibiotics (except ceftaroline). Preferred therapy is vancomycin  unless clinically contraindicated. Patient requires contact precautions if  hospitalized. CRITICAL RESULT CALLED TO, READ BACK BY AND VERIFIED WITH: MOSE BLEW PHARMD 1137 04/10/23 HNM    Staphylococcus epidermidis NOT DETECTED NOT  DETECTED Final   Staphylococcus lugdunensis NOT DETECTED NOT DETECTED Final   Streptococcus species NOT DETECTED NOT DETECTED Final   Streptococcus agalactiae NOT DETECTED NOT DETECTED Final   Streptococcus pneumoniae NOT DETECTED NOT DETECTED Final   Streptococcus pyogenes NOT DETECTED NOT DETECTED Final   A.calcoaceticus-baumannii NOT DETECTED NOT DETECTED Final   Bacteroides fragilis NOT DETECTED NOT DETECTED Final   Enterobacterales NOT DETECTED NOT DETECTED Final   Enterobacter cloacae complex NOT DETECTED NOT DETECTED Final   Escherichia coli NOT DETECTED NOT DETECTED Final   Klebsiella aerogenes NOT DETECTED NOT DETECTED Final   Klebsiella oxytoca NOT DETECTED NOT DETECTED Final   Klebsiella pneumoniae NOT DETECTED NOT DETECTED Final   Proteus species NOT DETECTED NOT DETECTED Final   Salmonella species NOT DETECTED NOT DETECTED Final   Serratia marcescens NOT DETECTED NOT DETECTED Final   Haemophilus influenzae NOT DETECTED NOT DETECTED Final   Neisseria meningitidis NOT DETECTED NOT DETECTED Final   Pseudomonas aeruginosa NOT DETECTED NOT DETECTED Final   Stenotrophomonas maltophilia NOT DETECTED NOT DETECTED Final   Candida albicans NOT DETECTED NOT DETECTED Final   Candida auris NOT DETECTED NOT DETECTED Final   Candida glabrata NOT DETECTED NOT DETECTED Final   Candida krusei NOT DETECTED NOT DETECTED Final   Candida parapsilosis NOT DETECTED NOT DETECTED Final   Candida tropicalis NOT DETECTED NOT DETECTED Final   Cryptococcus neoformans/gattii NOT DETECTED NOT DETECTED Final   Meth resistant mecA/C and MREJ DETECTED (A) NOT DETECTED Final    Comment: CRITICAL RESULT CALLED TO, READ BACK BY AND VERIFIED WITHBETHA MOSE BLEW PHARMD 1137 04/10/23 HNM Performed at [facility], [address] [place], [place] [address]   Respiratory (~20 pathogens) panel by PCR     Status: None   Collection Time: 04/10/23  1:27 AM   Specimen: Nasopharyngeal Swab; Respiratory   Result Value Ref Range Status   Adenovirus NOT DETECTED NOT DETECTED Final   Coronavirus 229E NOT DETECTED NOT DETECTED Final    Comment: (NOTE) The Coronavirus on the Respiratory Panel, DOES NOT test for the novel  Coronavirus (2019 nCoV)    Coronavirus HKU1 NOT DETECTED NOT DETECTED Final   Coronavirus NL63 NOT DETECTED NOT DETECTED Final   Coronavirus OC43 NOT DETECTED NOT DETECTED Final   Metapneumovirus NOT DETECTED NOT DETECTED Final   Rhinovirus / Enterovirus NOT DETECTED NOT DETECTED Final   Influenza A NOT DETECTED NOT DETECTED Final   Influenza B NOT DETECTED NOT DETECTED Final   Parainfluenza Virus 1 NOT DETECTED NOT DETECTED Final   Parainfluenza Virus 2 NOT DETECTED NOT DETECTED Final   Parainfluenza Virus 3 NOT DETECTED NOT DETECTED Final   Parainfluenza Virus 4 NOT DETECTED NOT DETECTED Final   Respiratory Syncytial Virus NOT DETECTED NOT DETECTED Final   Bordetella pertussis NOT DETECTED NOT DETECTED Final   Bordetella Parapertussis NOT DETECTED NOT DETECTED Final   Chlamydophila pneumoniae NOT DETECTED NOT DETECTED Final   Mycoplasma pneumoniae NOT DETECTED NOT DETECTED Final    Comment: Performed at [facility], [address], [place], [place] [address]    IMAGING RESULTS:  I have personally reviewed the films ?b/l peripheral pulmonary nodules with central cavitation  Impression/Recommendation ? 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 Professional time spent includes the following activities: Preparing to see the patient (review of tests), Obtaining and/or reviewing separately obtained history (admission/discharge record), Performing a medically appropriate examination and/or evaluation , Ordering medications/tests/procedures, referring and communicating with other health care professionals, Documenting clinical information in the EMR, Independently interpreting results (not separately reported), Communicating results to the patient/family/caregiver, Counseling and educating the patient/family/caregiver and Care coordination (not separately reported).    ________________________________________________  Note:  This document was prepared using Conservation officer, historic buildings and may include unintentional dictation errors.

## 2023-04-10 NOTE — Assessment & Plan Note (Signed)
 Patient presents with hypoxemia, fever, AKI, leukocytosis. In ED he received 2 L LR resuscitation.  Plan Continue to treat underlying infection

## 2023-04-10 NOTE — Consult Note (Signed)
 PHARMACY - ANTICOAGULATION CONSULT NOTE  Pharmacy Consult for warfarin Indication: hx recurrent VTE  Allergies  Allergen Reactions   Bacitracin Hives   Vancomycin  Other (See Comments) and Rash    Red man syndrome.   Ketorolac     Other Reaction(s): Kidney Disorder  Other reaction(s): Kidney Disorder   Spironolactone     Other Reaction(s): Unknown  Other reaction(s): Unknown   Patient Measurements: Height: 4' 10 (147.3 cm) Weight: (!) 136.2 kg (300 lb 4.8 oz) IBW/kg (Calculated) : 45.4  Vital Signs: Temp: 98.7 F (37.1 C) (02/05 1010) Temp Source: Rectal (02/05 0546) BP: 132/64 (02/05 0900) Pulse Rate: 83 (02/05 0930)  Labs: Recent Labs    04/09/23 2025 04/09/23 2132 04/10/23 0127 04/10/23 1051  HGB 10.9*  --   --  10.8*  HCT 31.4*  --   --  31.4*  PLT 299  --   --  229  APTT  --   --  55*  --   LABPROT  --   --  30.6*  --   INR  --   --  2.9*  --   CREATININE 3.89*  --   --  3.11*  TROPONINIHS  --  22*  --   --    Estimated Creatinine Clearance: 26.6 mL/min (A) (by C-G formula based on SCr of 3.11 mg/dL (H)).  Medical History: Past Medical History:  Diagnosis Date   Depression    History of blood clots 08/13/2011   History of left knee replacement    HTN (hypertension)    Lymphedema    lt leg-per pt   PE (pulmonary thromboembolism) (HCC)    Pressure injury of skin, unspecified injury stage, unspecified location    S/P AKA (above knee amputation) bilateral (HCC)    rt 11/24/07 and lt 01/11/17   PTA warfarin dosing: Warfarin dosing difficult to determine. Went to speak with patient who could not give a firm regimen. Base dose is 5 mg but phones in INR weekly with clinic. He estimates an average of warfarin 6 mg daily. Last dose was 2/4 morning.  Assessment: 69 y.o. 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 Patient was hospitalized last month for recurrent VTE. Patient was on warfarin at the time of that VTE, but at presentation INR was subtherapeutic at 1.2 due to lack of enoxaparin  bridge, and so warfarin was seen as not a therapeutic failure.  For this admission, pharmacy has been consulted to manage warfarin dosing. INR is 2.9 today, which is upper end of therapeutic. Given history of recurrent VTE on anticoagulation, I would target upper end of INR goal anyways.  New drug-drug interactions: N/A  Goal of Therapy:  INR 2-3 >> would aim for 2.5-3 Monitor platelets by anticoagulation protocol: Yes  Monitoring:  Date INR Assessment Warfarin Dose  Comments  2/5 2.9 Therapeutic 6 mg Upper end of therapeutic         Plan:  INR upper end of therapeutic. Would aim for INR target of 2.5-3 given recurrent DVT despite anticoagulation. Administer warfarin 6 mg PO x 1 today Daily INR Continue to monitor for signs & symptoms of bleeding and VTE   Will M. Lenon, PharmD Clinical Pharmacist 04/10/2023 11:30 AM

## 2023-04-10 NOTE — Evaluation (Signed)
 Clinical/Bedside Swallow Evaluation Patient Details  Name: [name] MRN: [id number] Date of Birth: [birth year]  Today's Date: 04/10/2023 Time: SLP Start Time (ACUTE ONLY): 1540 SLP Stop Time (ACUTE ONLY): 1630 SLP Time Calculation (min) (ACUTE ONLY): 50 min  Past Medical History:  Past Medical History:  Diagnosis Date   Depression    History of blood clots 08/13/2011   History of left knee replacement    HTN (hypertension)    Lymphedema    lt leg-per pt   PE (pulmonary thromboembolism) (HCC)    Pressure injury of skin, unspecified injury stage, unspecified location    S/P AKA (above knee amputation) bilateral (HCC)    rt 11/24/07 and lt 01/11/17   Past Surgical History:  Past Surgical History:  Procedure Laterality Date   ACHILLES TENDON SURGERY Left    per pt   BIOPSY  04/03/2023   Procedure: BIOPSY;  Surgeon: Jinny Carmine, MD;  Location: [facility] ENDOSCOPY;  Service: Endoscopy;;   bone clip removal Left 10/04/2015   knee   CARDIAC SURGERY  11/2012   cath.-per pt   CERVICAL FUSION  07/28/2015   per pt   COLONOSCOPY WITH PROPOFOL  N/A 04/03/2023   Procedure: COLONOSCOPY WITH PROPOFOL ;  Surgeon: Jinny Carmine, MD;  Location: [facility] ENDOSCOPY;  Service: Endoscopy;  Laterality: N/A;   EPIGASTRIC HERNIA REPAIR     per pt   hematoma removal Left 11/02/2015   knee   HIP SURGERY Right    x8 per pt   IR IMAGING GUIDED PORT INSERTION  11/14/2022   KNEE FUSION Right    KNEE JOINT MANIPULATION Left 09/11/2011   KNEE SURGERY Left    x3   leg stump Left 03/2017   leg surgery-per pt   NECK SURGERY  02/2015   plates-per pt   REPLACEMENT TOTAL KNEE Right    REPLACEMENT TOTAL KNEE Left 07/02/2011   again in 07/10/2011 per pt   s/p of bilateral AKA Right    screen filter removal  10/20/2008   for embolism per pt   screen filter replacement     x3   SHOULDER SURGERY Right    x2-per pt   SPINE SURGERY  02/11/2015   vertebra spacer-per pt   toe nail removal Right    per pt    VARICOSE VEIN SURGERY     x5-per pt   HPI:  Pt is a 68yo with h/o stage IV prostate cancer widely metastatic to bone, pt endorsed Anxiety, recurrent VTE, HTN, PVD s/p B AKA, and pressure ulcers who was admitted on 1/18 with SOB, found to have recurrent DVT despite Eliquis  and then Coumadin  (no bridge so not failure). He had been  unable to afford Lovenox  as an outpatient. During the hospitalization, he had volume overload and has been diuresed. Also with ileus vs. Stool impaction that is being treated with aggressive bowel regimen. He was discharged in stable condition 04/05/23.   On 04/09/23, he developed confusion and marked SOB/increased WOB. EMS activated and he was found by EMT to be in respiratory distress with RA O2 sat of 89%. He was placed on CPAP and transported to [facility]ED.  He has requested not to wear the BIPAP d/t Anxiety; he was Not confused during this BSE today.    CT of Chest: Lungs/Pleura: Multiple peripheral rounded masses are seen within the  lungs measuring up to 18 x 21 mm with the index lesion within the  left upper lobe demonstrating central cavitation and a ground-glass  halo suspicious  for multiple septic emboli particularly as these  appear new within the lung bases since prior examination. A small  bilateral pleural effusions are present with bibasilar compressive  atelectasis.    Assessment / Plan / Recommendation  Clinical Impression   Pt seen for BSE today. Pt awake, verbal and fully engaged in conversation w/ this SLP. Pt has Baseline Pulmonary issues and is easily SOB w/ increased exertion including talking. Rest Breaks for conservation of energy and to calm breathing instructed on during this session.  On [place] 4L; WBC trending down. Afebrile.  Pt appears to present w/ functional oropharyngeal phase swallow w/ easy to eat/soft foods w/ No overt oropharyngeal phase dysphagia noted, No neuromuscular deficits noted. Pt consumed po trials w/ No overt, clinical s/s of aspiration  during the po trials.  Pt appears at reduced risk for aspiration when following general aspiration precautions w/ easy to eat, moistened foods for bolus cohesion.  However, pt does have challenging factors that could impact oropharyngeal swallowing to include Baseline Pulmonary decline and ease for WOB/SOB w/ ANY exertion including talking at this time, deconditioning/weakness, and acute/chronic illnesses w/ repeat hospitalization. These factors can increase risk for dysphagia as well as decreased oral intake overall.   During po trials, pt consumed all consistencies w/ no overt coughing, decline in vocal quality, or change in respiratory presentation during/post trials. O2 sats remained 93-95% during trials. Oral phase appeared [facility] w/ timely bolus management, mastication, and control of bolus propulsion for A-P transfer for swallowing. Oral clearing achieved w/ all trial consistencies -- moistened, soft foods given for ease and to reduce exertion.  Rest Breaks b/t trials x3 were encouraged for conservation of energy, calming.  OM Exam appeared [facility] w/ no unilateral weakness noted. Speech Clear. Pt fed self w/ setup support.   Recommend a Regular/Mech Soft consistency diet w/ well-Cut/Chopped meats, moistened foods for ease of management; Thin liquids -- monitor straw use for small sips slowly. Recommend general aspiration precautions, Rest Breaks during meals for conservation of energy. Pills WHOLE in Puree for safer, easier swallowing.  Education given on Pills in Puree; food consistencies and easy to eat options; general aspiration precautions to pt. ST services will sign off at this time as pt appears at his swallowing Baseline. MD/NSG to reconsult if any new needs arise during admit. NSG updated, agreed. MD updated. Recommend Dietician f/u for support. SLP Visit Diagnosis: Dysphagia, unspecified (R13.10) (impact from Baseline Pulmonary decline suspected)    Aspiration Risk  Mild aspiration risk;Risk  for inadequate nutrition/hydration (reduced when following general aspiration precautions and taking his time eating/drking - rest breaks as needed)    Diet Recommendation   Thin;Age appropriate regular;Dysphagia 3 (mechanical soft) (moistened, cut foods -- no salads) = Regular/Mech Soft consistency diet w/ well-Cut/Chopped meats, moistened foods for ease of management; Thin liquids -- monitor straw use for small sips slowly. Recommend general aspiration precautions, Rest Breaks during meals for conservation of energy.   Medication Administration: Whole meds with puree    Other  Recommendations Recommended Consults:  (Dietician) Oral Care Recommendations: Oral care BID;Patient independent with oral care (setup)    Recommendations for follow up therapy are one component of a multi-disciplinary discharge planning process, led by the attending physician.  Recommendations may be updated based on patient status, additional functional criteria and insurance authorization.  Follow up Recommendations No SLP follow up (can f/u w/ PCP post D/C for any needs)      Assistance Recommended at Discharge  PRN  Functional Status Assessment Patient has not had a recent decline in their functional status  Frequency and Duration  (n/a)   (n/a)       Prognosis Prognosis for improved oropharyngeal function: Fair (-Good) Barriers to Reach Goals: Time post onset;Severity of deficits Barriers/Prognosis Comment: Baseline Pulmonary decline; deconditioning overall      Swallow Study   General Date of Onset: 04/09/23 HPI: Pt is a 68yo with h/o stage IV prostate cancer widely metastatic to bone, recurrent VTE, HTN, PVD s/p B AKA, and pressure ulcers who was admitted on 1/18 with SOB, found to have recurrent DVT despite Eliquis  and then Coumadin  (no bridge so not failure) He had been  unable to afford Lovenox  as an outpatient. During the hospitalization, he had volume overload and has been diuresed. Also with ileus  vs. Stool impaction that is being treated with aggressive bowel regimen. He was discharged in stable condition 04/05/23.       On 04/09/23, he developed confusion and marked SOB/increased WOB. EMS activated and he was found by EMT to be in respiratory distress with RA O2 sat of 89%. He was placed on CPAP and transported to [facility]ED.  He has requested not to wear the BIPAP d/t Anxiety; he was Not confused during this BSE today.   CT of Chest: Lungs/Pleura: Multiple peripheral rounded masses are seen within the  lungs measuring up to 18 x 21 mm with the index lesion within the  left upper lobe demonstrating central cavitation and a ground-glass  halo suspicious for multiple septic emboli particularly as these  appear new within the lung bases since prior examination. A small  bilateral pleural effusions are present with bibasilar compressive  atelectasis. Type of Study: Bedside Swallow Evaluation Previous Swallow Assessment: 01/2923 w/ c/o tiredness when chewing food items such as granola bars. F/u rec'd but did not see any follow through w/ tx per chart. Diet Prior to this Study: Regular;Thin liquids (Level 0) (soft foods diet per pt) Temperature Spikes Noted: No (WBC trending down) Respiratory Status: Nasal cannula (4L) History of Recent Intubation: No Behavior/Cognition: Alert;Cooperative;Pleasant mood Oral Cavity Assessment: Within Functional Limits Oral Care Completed by SLP: Recent completion by staff Oral Cavity - Dentition: Adequate natural dentition Vision: Functional for self-feeding Self-Feeding Abilities: Able to feed self;Needs set up Patient Positioning: Upright in bed Baseline Vocal Quality: Normal Volitional Cough: Strong Volitional Swallow: Able to elicit    Oral/Motor/Sensory Function Overall Oral Motor/Sensory Function: Within functional limits   Ice Chips Ice chips: Within functional limits Presentation: Spoon (fed; 3 trials)   Thin Liquid Thin Liquid: Within functional  limits Presentation: Self Fed;Straw (10 trials)    Nectar Thick Nectar Thick Liquid: Not tested   Honey Thick Honey Thick Liquid: Not tested   Puree Puree: Within functional limits Presentation: Self Fed;Spoon (6+ trials)   Solid     Solid: Within functional limits (moistened well) Presentation: Self Fed (8+ trials) Other Comments: rest breaks as needed        Comer Portugal, MS, CCC-SLP Speech Language Pathologist Rehab Services; [facility] [phone] (ascom) Yedidya Duddy 04/10/2023,5:19 PM

## 2023-04-10 NOTE — Progress Notes (Addendum)
 PHARMACY - PHYSICIAN COMMUNICATION CRITICAL VALUE ALERT - BLOOD CULTURE IDENTIFICATION (BCID)  [name] is an 68 y.o. male who presented to [facility] on 04/09/2023 with a chief complaint of confusion and shortness of breath  Assessment:  2/4 blood cx with GPC, BCID detects MRSA.  Concern for septic pulmonary emboli on CT,  source unknown but has port, pressure wounds, history of cervical fusion  Name of physician (or Provider) Contacted: Drs Fayette and Dezii  Current antibiotics: Vancomycin    Changes to prescribed antibiotics recommended:  Patient is on recommended antibiotics - No changes needed ID consult  Results for orders placed or performed during the hospital encounter of 04/09/23  Blood Culture ID Panel (Reflexed) (Collected: 04/09/2023  8:19 PM)  Result Value Ref Range   Enterococcus faecalis NOT DETECTED NOT DETECTED   Enterococcus Faecium NOT DETECTED NOT DETECTED   Listeria monocytogenes NOT DETECTED NOT DETECTED   Staphylococcus species DETECTED (A) NOT DETECTED   Staphylococcus aureus (BCID) DETECTED (A) NOT DETECTED   Staphylococcus epidermidis NOT DETECTED NOT DETECTED   Staphylococcus lugdunensis NOT DETECTED NOT DETECTED   Streptococcus species NOT DETECTED NOT DETECTED   Streptococcus agalactiae NOT DETECTED NOT DETECTED   Streptococcus pneumoniae NOT DETECTED NOT DETECTED   Streptococcus pyogenes NOT DETECTED NOT DETECTED   A.calcoaceticus-baumannii NOT DETECTED NOT DETECTED   Bacteroides fragilis NOT DETECTED NOT DETECTED   Enterobacterales NOT DETECTED NOT DETECTED   Enterobacter cloacae complex NOT DETECTED NOT DETECTED   Escherichia coli NOT DETECTED NOT DETECTED   Klebsiella aerogenes NOT DETECTED NOT DETECTED   Klebsiella oxytoca NOT DETECTED NOT DETECTED   Klebsiella pneumoniae NOT DETECTED NOT DETECTED   Proteus species NOT DETECTED NOT DETECTED   Salmonella species NOT DETECTED NOT DETECTED   Serratia marcescens NOT DETECTED NOT  DETECTED   Haemophilus influenzae NOT DETECTED NOT DETECTED   Neisseria meningitidis NOT DETECTED NOT DETECTED   Pseudomonas aeruginosa NOT DETECTED NOT DETECTED   Stenotrophomonas maltophilia NOT DETECTED NOT DETECTED   Candida albicans NOT DETECTED NOT DETECTED   Candida auris NOT DETECTED NOT DETECTED   Candida glabrata NOT DETECTED NOT DETECTED   Candida krusei NOT DETECTED NOT DETECTED   Candida parapsilosis NOT DETECTED NOT DETECTED   Candida tropicalis NOT DETECTED NOT DETECTED   Cryptococcus neoformans/gattii NOT DETECTED NOT DETECTED   Meth resistant mecA/C and MREJ DETECTED (A) NOT DETECTED    Celestine Slovak, PharmD, BCPS, BCIDP Work Cell: [phone] 04/10/2023 1:21 PM

## 2023-04-10 NOTE — ED Notes (Signed)
 Patient saturated in liquid stool. Bed saturated. Patient provided full linen change. Chucks placed. When cleaning patient, stage 2 pressure ulcer on buttocks and in butt crack.

## 2023-04-10 NOTE — H&P (Signed)
 History and Physical    [name] FMW:[id number] DOB: 1955-03-14 DOA: 04/09/2023  DOS: the patient was seen and examined on 04/09/2023  PCP: Eliverto Bette Hover, MD   Patient coming from: Home  I have personally briefly reviewed patient's old medical records in [facility]  [phone] with h/o stage IV prostate CA, recurrent VTE, HTN, PVD s/p B AKA, and pressure ulcers who was admitted on 1/18 with SOB, found to have recurrent DVT despite Eliquis  and then Coumadin  (no bridge so not failure) He had been  unable to afford Lovenox  as an outpatient. During the hospitalization, he had volume overload and has been diuresed. Also with ileus vs. Stool impaction that is being treated with aggressive bowel regimen. He was discharged in stable condition 04/05/23.   On 04/09/23 he developed increased confusion and marked SOB/increased WOB. EMS activated and he was found by EMT to be in respiratory distress with RA O2 sat of 89%. He was placed on CPAP and transported to [facility].    ED Course: T 101.9  114/67  HR 88 RR 31  Obese man on BiPAP poor historian due to treatment. Chart reviewed including recent admit 03/23/23-03/30/23. VBG 7/37/38/47  Na 123, Cr 3.89, WBC 20 with 90/2/7, Hgb 10.9, CXR left basilar opacity. In ED received 2L IVF.Vancomycin  and cefepime  administered. Patient was put to BiPAP due to hypoxemia. He failed weaning trial due to increased WOB although he had good oxygen saturation. TRH called to admit for continued management of HAP and sepsis.   Review of Systems:  Review of Systems  Unable to perform ROS: Acuity of condition    Past Medical History:  Diagnosis Date   Depression    History of blood clots 08/13/2011   History of left knee replacement    HTN (hypertension)    Lymphedema    lt leg-per pt   PE (pulmonary thromboembolism) (HCC)    Pressure injury of skin, unspecified injury stage, unspecified location    S/P AKA (above knee amputation) bilateral (HCC)    rt  11/24/07 and lt 01/11/17    Past Surgical History:  Procedure Laterality Date   ACHILLES TENDON SURGERY Left    per pt   BIOPSY  04/03/2023   Procedure: BIOPSY;  Surgeon: Jinny Carmine, MD;  Location: [facility] ENDOSCOPY;  Service: Endoscopy;;   bone clip removal Left 10/04/2015   knee   CARDIAC SURGERY  11/2012   cath.-per pt   CERVICAL FUSION  07/28/2015   per pt   COLONOSCOPY WITH PROPOFOL  N/A 04/03/2023   Procedure: COLONOSCOPY WITH PROPOFOL ;  Surgeon: Jinny Carmine, MD;  Location: [facility] ENDOSCOPY;  Service: Endoscopy;  Laterality: N/A;   EPIGASTRIC HERNIA REPAIR     per pt   hematoma removal Left 11/02/2015   knee   HIP SURGERY Right    x8 per pt   IR IMAGING GUIDED PORT INSERTION  11/14/2022   KNEE FUSION Right    KNEE JOINT MANIPULATION Left 09/11/2011   KNEE SURGERY Left    x3   leg stump Left 03/2017   leg surgery-per pt   NECK SURGERY  02/2015   plates-per pt   REPLACEMENT TOTAL KNEE Right    REPLACEMENT TOTAL KNEE Left 07/02/2011   again in 07/10/2011 per pt   s/p of bilateral AKA Right    screen filter removal  10/20/2008   for embolism per pt   screen filter replacement     x3   SHOULDER SURGERY Right  x2-per pt   SPINE SURGERY  02/11/2015   vertebra spacer-per pt   toe nail removal Right    per pt   VARICOSE VEIN SURGERY     x5-per pt     reports that he has never smoked. He has quit using smokeless tobacco. He reports current alcohol use of about 3.0 standard drinks of alcohol per week. He reports that he does not use drugs.  Allergies  Allergen Reactions   Bacitracin Hives   Vancomycin  Other (See Comments) and Rash    Red man syndrome.   Ketorolac     Other Reaction(s): Kidney Disorder  Other reaction(s): Kidney Disorder   Spironolactone     Other Reaction(s): Unknown  Other reaction(s): Unknown    Family History  Problem Relation Age of Onset   Alzheimer's disease Mother    Lung cancer Father     Prior to Admission medications   Medication  Sig Start Date End Date Taking? Authorizing Provider  buPROPion  (WELLBUTRIN  XL) 300 MG 24 hr tablet Take 300 mg by mouth daily. 07/26/22   [provider]  darolutamide  (NUBEQA ) 300 MG tablet Take 2 tablets (600 mg total) by mouth 2 (two) times daily with a meal. 03/29/23   Brahmanday, Govinda R, MD  diphenoxylate -atropine  (LOMOTIL ) 2.5-0.025 MG tablet Take 1 tablet by mouth 4 (four) times daily as needed for diarrhea or loose stools. 03/11/23   Amin, Sumayya, MD  furosemide  (LASIX ) 20 MG tablet Take 1 tablet (20 mg total) by mouth 2 (two) times daily. 04/05/23 04/04/24  Barbarann Nest, MD  gabapentin  (NEURONTIN ) 300 MG capsule Take 2 capsules (600 mg total) by mouth 3 (three) times daily. 02/15/23 06/15/23  Borders, Fonda SAUNDERS, NP  lactulose  (CHRONULAC ) 10 GM/15ML solution Take 30 mLs (20 g total) by mouth 2 (two) times daily as needed for mild constipation. 04/05/23   Barbarann Nest, MD  leptospermum manuka honey (MEDIHONEY) PSTE paste Apply 1 Application topically daily. 03/11/23   Caleen Qualia, MD  lidocaine -prilocaine  (EMLA ) cream Apply 1 Application topically as needed. 11/27/22   Brahmanday, Govinda R, MD  lisinopril  (ZESTRIL ) 10 MG tablet Take 1 tablet (10 mg total) by mouth daily. 04/06/23   Barbarann Nest, MD  ondansetron  (ZOFRAN ) 8 MG tablet Take 1 tablet (8 mg total) by mouth every 8 (eight) hours as needed for nausea or vomiting. 11/29/22   Brahmanday, Govinda R, MD  oxyCODONE -acetaminophen  (PERCOCET/ROXICET) 5-325 MG tablet Take 1 tablet by mouth every 12 (twelve) hours as needed for moderate pain (pain score 4-6). 01/02/23   Borders, Fonda SAUNDERS, NP  polyethylene glycol powder (GLYCOLAX /MIRALAX ) 17 GM/SCOOP powder Take 17 g by mouth 2 (two) times daily. Mix as directed. 04/05/23   Barbarann Nest, MD  psyllium (HYDROCIL/METAMUCIL) 95 % PACK Take 1 packet by mouth daily. 04/06/23   Barbarann Nest, MD  senna-docusate (SENOKOT-S) 8.6-50 MG tablet Take 2 tablets by mouth 2 (two) times daily as  needed for mild constipation. 10/01/22   Zhang, Dekui, MD  warfarin (COUMADIN ) 5 MG tablet Take 5-7.5 mg by mouth daily.    [provider]    Physical Exam: Vitals:   04/10/23 0000 04/10/23 0015 04/10/23 0030 04/10/23 0045  BP: (!) 89/53 (!) 86/49 99/65 98/62   Pulse: 85 85 82 77  Resp: (!) 31 20 20 12   Temp:      TempSrc:      SpO2: 96% 100% 100% 100%  Weight:      Height:        Physical  Exam Vitals and nursing note reviewed.  Constitutional:      General: He is in acute distress.     Appearance: He is obese. He is ill-appearing and toxic-appearing. He is not diaphoretic.     Comments: somnolent  HENT:     Head: Normocephalic and atraumatic.  Eyes:     Extraocular Movements: Extraocular movements intact.     Pupils: Pupils are equal, round, and reactive to light.  Neck:     Vascular: No JVD.     Trachea: No tracheal deviation.  Cardiovascular:     Rate and Rhythm: Normal rate and regular rhythm.     Pulses: Normal pulses.     Comments: Heart sounds very distant due to girth and obscured by BiPAP Pulmonary:     Effort: Tachypnea and accessory muscle usage present.     Breath sounds: Examination of the right-lower field reveals wheezing. Examination of the left-lower field reveals decreased breath sounds, wheezing and rhonchi. Decreased breath sounds, wheezing and rhonchi present. No rales.     Comments: On BiPAP Chest:     Chest wall: No tenderness or edema. There is dullness to percussion.  Abdominal:     Comments: Obese, BS + x 4. No guarding or rebound but abdomen firm.   Musculoskeletal:     Cervical back: Normal range of motion.     Comments: Right BKA, Left AKA  Skin:    General: Skin is warm and dry.     Comments: Did not view buttock or ischial tuberosity - could not move patient unassisted.   Neurological:     Mental Status: He is oriented to person, place, and time.      Labs on Admission: I have personally reviewed following labs and imaging  studies  CBC: Recent Labs  Lab 04/03/23 0517 04/04/23 0520 04/05/23 0451 04/09/23 2025  WBC 2.5* 2.7* 3.5* 20.0*  NEUTROABS  --  1.4* 2.4 18.2*  HGB 11.7* 11.3* 11.9* 10.9*  HCT 35.8* 34.3* 36.2* 31.4*  MCV 97.3 98.6 98.6 92.1  PLT 234 241 263 299   Basic Metabolic Panel: Recent Labs  Lab 04/03/23 0517 04/04/23 0520 04/05/23 0451 04/09/23 2025  NA 139 139 140 125*  K 3.5 3.4* 3.0* 3.3*  CL 102 104 102 90*  CO2 28 28 28  21*  GLUCOSE 90 91 117* 117*  BUN 13 14 15  57*  CREATININE 0.75 0.78 0.98 3.89*  CALCIUM 9.7 9.6 9.5 9.2  MG 2.0  --   --   --    GFR: Estimated Creatinine Clearance: 21.3 mL/min (A) (by C-G formula based on SCr of 3.89 mg/dL (H)). Liver Function Tests: Recent Labs  Lab 04/03/23 0517 04/09/23 2025  AST 24 48*  ALT 21 22  ALKPHOS 64 59  BILITOT 0.8 2.6*  PROT 6.8 7.2  ALBUMIN  3.8 3.8   No results for input(s): LIPASE, AMYLASE in the last 168 hours. No results for input(s): AMMONIA in the last 168 hours. Coagulation Profile: Recent Labs  Lab 04/03/23 0517 04/04/23 0520 04/05/23 0451  INR 1.9* 2.2* 2.2*   Cardiac Enzymes: No results for input(s): CKTOTAL, CKMB, CKMBINDEX, TROPONINI in the last 168 hours. BNP (last 3 results) No results for input(s): PROBNP in the last 8760 hours. HbA1C: No results for input(s): HGBA1C in the last 72 hours. CBG: Recent Labs  Lab 04/04/23 2235  GLUCAP 109*   Lipid Profile: No results for input(s): CHOL, HDL, LDLCALC, TRIG, CHOLHDL, LDLDIRECT in the last 72 hours. Thyroid Function  Tests: No results for input(s): TSH, T4TOTAL, FREET4, T3FREE, THYROIDAB in the last 72 hours. Anemia Panel: No results for input(s): VITAMINB12, FOLATE, FERRITIN, TIBC, IRON, RETICCTPCT in the last 72 hours. Urine analysis: No results found for: COLORURINE, APPEARANCEUR, LABSPEC, PHURINE, GLUCOSEU, HGBUR, BILIRUBINUR, KETONESUR, PROTEINUR,  UROBILINOGEN, NITRITE, LEUKOCYTESUR  Radiological Exams on Admission: I have personally reviewed images CT CHEST ABDOMEN PELVIS WO CONTRAST Result Date: 04/10/2023 CLINICAL DATA:  Sepsis, metastatic prostate cancer, altered mental status, dyspnea. * Tracking Code: BO * EXAM: CT CHEST, ABDOMEN AND PELVIS WITHOUT CONTRAST TECHNIQUE: Multidetector CT imaging of the chest, abdomen and pelvis was performed following the standard protocol without IV contrast. RADIATION DOSE REDUCTION: This exam was performed according to the departmental dose-optimization program which includes automated exposure control, adjustment of the mA and/or kV according to patient size and/or use of iterative reconstruction technique. COMPARISON:  03/26/2023 FINDINGS: CT CHEST FINDINGS Cardiovascular: No significant coronary artery calcification. Global cardiac size is mildly enlarged. No pericardial effusion. Central pulmonary arteries are mildly enlarged suggesting changes of pulmonary arterial hypertension. Mild atherosclerotic calcification within the thoracic aorta. No aortic aneurysm. Right internal jugular chest port tip seen at the superior cavoatrial junction. Mediastinum/Nodes: No enlarged mediastinal, hilar, or axillary lymph nodes. Thyroid gland, trachea, and esophagus demonstrate no significant findings. Lungs/Pleura: Multiple peripheral rounded masses are seen within the lungs measuring up to 18 x 21 mm with the index lesion within the left upper lobe demonstrating central cavitation and a ground-glass halo suspicious for multiple septic emboli particularly as these appear new within the lung bases since prior examination. A small bilateral pleural effusions are present with bibasilar compressive atelectasis. No pneumothorax. No central obstructing lesion. Musculoskeletal: Numerous sclerotic metastases are seen throughout the visualized axial skeleton. No pathologic fracture identified. CT ABDOMEN PELVIS FINDINGS  Hepatobiliary: Cholelithiasis without pericholecystic inflammatory change noted. Liver unremarkable in this noncontrast examination. No intra or extrahepatic biliary ductal dilation. Pancreas: Unremarkable Spleen: Unremarkable Adrenals/Urinary Tract: The adrenal glands are unremarkable. The kidneys are normal in size and position. Multiple root hyperdense renal cysts are seen within the left kidney which were not metabolically active on PET CT examination of 10/12/2022 and further follow-up is not required. The kidneys are otherwise unremarkable. Bladder is unremarkable. Stomach/Bowel: The distal colon and rectum are fluid-filled in keeping with a diarrheal state. The stomach, small bowel, large bowel otherwise unremarkable. Appendix normal. No free intraperitoneal gas or fluid. Vascular/Lymphatic: Aortic atherosclerosis. No enlarged abdominal or pelvic lymph nodes. Reproductive: Prostate is unremarkable. Other: No abdominal wall hernia or abnormality. No abdominopelvic ascites. 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 No pathologic fracture. IMPRESSION: 1. Multiple peripheral rounded masses within the lungs measuring up to 18 x 21 mm with the index lesion within the left upper lobe demonstrating central cavitation and a ground-glass Halo suspicious for multiple septic emboli, a a particularly as these appear new within the lung bases since prior examination. 2. Small bilateral pleural effusions with bibasilar compressive atelectasis. 3. 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 No pathologic fracture identified. 4. Cholelithiasis. 5. Fluid-filled distal colon and rectum in keeping with a diarrheal state. Aortic Atherosclerosis (ICD10-I70.0). Electronically Signed   By: Dorethia Molt M.D.   On:  04/10/2023 00:08   DG Chest Portable 1 View Result Date: 04/09/2023 CLINICAL DATA:  Shortness of breath EXAM: PORTABLE CHEST 1 VIEW COMPARISON:  03/29/2023  FINDINGS: There is a right chest wall power-injectable Port-A-Cath with tip at the cavoatrial junction via a right internal jugular vein approach. Mild cardiomegaly. Mild left basilar opacity, likely atelectasis. IMPRESSION: Mild left basilar opacity, likely atelectasis. Electronically Signed   By: Franky Stanford M.D.   On: 04/09/2023 20:58    EKG: I have personally reviewed EKG: NSR, IVCD, no acute changes  Assessment/Plan Principal Problem:   HAP (hospital-acquired pneumonia) Active Problems:   Severe sepsis (HCC)   PE (pulmonary thromboembolism) (HCC)   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   Ogilvie syndrome   Prostate cancer metastatic to bone [facility])   Essential hypertension    Assessment and Plan: * HAP (hospital-acquired pneumonia) Patient with tachypnea, hypoxemia, fever, cough with sputum production, and opacity left lower lob on CXR c/w pneumonia - given his recent hospitalization will consider this HCAP. Although sats are ok he does desaturate off BiPAP and struggles with increased WOB. On exam, difficult due to obesity and BiPAP there was expiratory wheezing  Plan Admit to progressive care  Continue cefepime  q8  Continue Vancomycin  - pharmacy consult  Solumedrol 80 mg IV  Procalcitonin for baseline  Continue BiPAP with 40% oxygen - weaning trial later today 04/10/23  Severe sepsis [facility]) Patient presents with hypoxemia, fever, AKI, leukocytosis. In ED he received 2 L LR resuscitation.  Plan Continue to treat underlying infection  Ogilvie syndrome Patient reports he has had BMs.  Plan Continue bowel regimen  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 Patient with pressure ulcers noted last admission on ischial tuberosity and buttock left.  Plan  Continue with same treatments as at home -  medihoney to ulcers daily  PE (pulmonary thromboembolism) (HCC) Patient had recent recurrent PE - see d/c summary 03/30/23.With this illness he does have adequate oxygenation.  Plan Continue Warfarin - pharmacy to consult  Essential hypertension BP is stable at 114/67  Plan Continue home regimen  Prostate cancer metastatic to bone [facility]) Followed by oncology. Treatment currently on hold   Disposition - TOC re: possible placement or HH needs    DVT prophylaxis: Coumadin  Code Status: Full Code based on prior admissions Family Communication: no one to contact at the late hour  Disposition Plan: TBD  Consults called: none  Admission status: Inpatient, Step Down Unit   Ozell Haggis, MD [facility] 04/10/2023, 1:21 AM

## 2023-04-10 NOTE — Sepsis Progress Note (Signed)
 Elink monitoring for the code sepsis protocol.

## 2023-04-10 NOTE — ED Notes (Signed)
 Pt assisted onto bedpan.

## 2023-04-10 NOTE — Assessment & Plan Note (Signed)
 Patient reports he has had BMs.  Plan Continue bowel regimen

## 2023-04-10 NOTE — ED Notes (Signed)
 CCMD called to confirm pt monitoring.

## 2023-04-10 NOTE — ED Notes (Signed)
 Patient removing Bipap and refusing to wear it. MD Dezil notified.

## 2023-04-10 NOTE — Assessment & Plan Note (Signed)
 Followed by oncology. Treatment currently on hold

## 2023-04-10 NOTE — ED Notes (Signed)
 RT at bedside. Pt with increase labored breathing, difficulty speaking. Placed back on bipap

## 2023-04-10 NOTE — Subjective & Objective (Signed)
 67yo with h/o stage IV prostate CA, recurrent VTE, HTN, PVD s/p B AKA, and pressure ulcers who was admitted on 1/18 with SOB, found to have recurrent DVT despite Eliquis  and then Coumadin  (no bridge so not failure) He had been  unable to afford Lovenox  as an outpatient. During the hospitalization, he had volume overload and has been diuresed. Also with ileus vs. Stool impaction that is being treated with aggressive bowel regimen. He was discharged in stable condition 04/05/23.   On 04/09/23 he developed increased confusion and marked SOB/increased WOB. EMS activated and he was found by EMT to be in respiratory distress with RA O2 sat of 89%. He was placed on CPAP and transported to [facility]ED.

## 2023-04-10 NOTE — Progress Notes (Signed)
 PROGRESS NOTE    [name]  FMW:[id number] DOB: February 28, 1956 DOA: 04/09/2023 PCP: Eliverto Bette Hover, MD  Chief Complaint  Patient presents with   Shortness of Breath    Hospital Course:  [name] is 68 y.o. male with stage IV prostate cancer, recurrent VTE, hypertension, PVD status post AKA, decubitus ulcers, who was admitted on 1/18 with shortness of breath and was found to have recurrent DVT despite Eliquis  and then Coumadin  (not technically a failure as he was not bridged.)  He had been unable to afford Lovenox  as an outpatient.  During hospitalization he was noted to have volume overload and was diuresed.  Stay was also complicated by ileus for stool impaction that was treated aggressively with a bowel regimen.  He was discharged in stable condition on 1/31.  He returned to the ED on 2/4 due to increasing confusion and shortness of breath.  He was found to be saturating 89% on room air.  EMS placed patient on CPAP prior to arrival.  In the ED he was found to be septic with fever of 101.9, respiratory rate 31, leukocytosis 20K, and hypoxia.  Chest x-ray revealed left basilar opacity.  He was placed on BiPAP due to work of breathing and hypoxemia.  He failed weaning trial due to work of breathing though he did have good oxygen saturations.  He was admitted for management of healthcare associated pneumonia and sepsis.  Subjective: Evaluation this morning patient has labored breathing with minimal exertion and with talking.  He is endorsing pain in his right flank.  He reports the pain is worse with deep inspiration.    He also endorses feeling exhausted by his body.  He is requesting palliative care and possible hospice involvement.  He does also report some hope and is currently working on publishing a book. Patient has plans to participate in a rowing event next week.   Objective: Vitals:   04/10/23 0800 04/10/23 0900 04/10/23 0930 04/10/23 1010  BP: 115/74 132/64     Pulse: 70 78 83   Resp: 18 (!) 22 (!) 23   Temp:    98.7 F (37.1 C)  TempSrc:      SpO2: 99% 98% 92%   Weight:      Height:        Intake/Output Summary (Last 24 hours) at 04/10/2023 1013 Last data filed at 04/10/2023 0925 Gross per 24 hour  Intake 1.08 ml  Output 1500 ml  Net -1498.92 ml   Filed Weights   04/09/23 2019  Weight: 136 kg    Examination: General exam: Appears calm, some distress Respiratory system: Shortened expiratory phase, shallow respirations Cardiovascular system: S1 & S2 heard, RRR.  Gastrointestinal system: Abdomen is distended, diffusely tender to palpation, most tender over right flank. Neuro: Alert and oriented. No focal neurological deficits. Extremities: Bilateral AKA  Skin: No rashes, lesions Psychiatry: Demonstrates appropriate judgement and insight. Mood & affect appropriate for situation.   Assessment & Plan:  Principal Problem:   HAP (hospital-acquired pneumonia) Active Problems:   Severe sepsis (HCC)   PE (pulmonary thromboembolism) (HCC)   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   Ogilvie syndrome   AKI (acute kidney injury) (HCC)   Prostate cancer metastatic to bone [facility])   Essential hypertension    Severe sepsis - Criteria met with hypoxemia, fever, AKI, leukocytosis, tachypnea.  Source: Pneumonia - Antibiotics as below  MRSA bacteremia - Patient is on vancomycin  - ID consulted - He does  have a port in place and there are multiple nodules in the lungs concerning for septic emboli - Echocardiogram has been ordered to evaluate for endocarditis - May need to remove the port for line holiday.  Healthcare associated pneumonia - Left lower lobe opacity on CXR - Given recent discharge from hospital 4 days prior we will treat for healthcare associated pneumonia. - Continue to wean oxygen as tolerated, is intermittently requiring BiPAP due to work of breathing but maintaining O2 sats. - Will continue with cefepime  and  vancomycin  - Continue with Solu-Medrol  - Procalcitonin: 5.8  AKI - Baseline creatinine 0.98, creatinine 3.89 on arrival - Improving some, continue to trend - Continue with gentle NS MIVF  History of Ogilivie syndrome - Currently having bowel movements, continue bowel regimen  Pressure ulcers of skin - Last noted on ischial tuberosity and left buttock - Continue with Medihoney to ulcers - Wound nurse consult  Pulmonary embolism - Has had recurrent PEs - Continue warfarin with pharmacy consult for dosing  Hypertension - Continue home meds  Prostate cancer metastatic to bone - Follows with oncology outpatient, currently holding treatment in setting of sepsis -- may require port removal given newfound bacteremia, will discuss with patient - Palliative consult at patient request - Appears patient was in early stages of hospice planning outpatient  Hypokalemia - Replace as needed  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  Currently necessitates IV antibiotics.  Will obtain PT/OT consults when medically appropriate and then discussed discharge planning with TOC at that time.    Consultants:  ID  Procedures:    Antimicrobials:  Anti-infectives (From admission, onward)    Start     Dose/Rate Route Frequency Ordered Stop   04/10/23 0308  vancomycin  variable dose per unstable renal function (pharmacist dosing)         Does not apply See admin instructions 04/10/23 0308     04/10/23 0145  vancomycin  (VANCOREADY) IVPB 1500 mg/300 mL        1,500 mg 100 mL/hr over 180 Minutes Intravenous  Once 04/10/23 0131 04/10/23 0546   04/10/23 0130  vancomycin  (VANCOCIN ) IVPB 1000 mg/200 mL premix  Status:  Discontinued        1,000 mg 200 mL/hr over 60 Minutes Intravenous  Once 04/10/23 0120 04/10/23 0131   04/09/23 2100   vancomycin  (VANCOCIN ) IVPB 1000 mg/200 mL premix        1,000 mg 200 mL/hr over 60 Minutes Intravenous  Once 04/09/23 2047 04/10/23 0113   04/09/23 2100  ceFEPIme  (MAXIPIME ) 2 g in sodium chloride  0.9 % 100 mL IVPB        2 g 200 mL/hr over 30 Minutes Intravenous  Once 04/09/23 2047 04/10/23 0113       Data Reviewed: I have personally reviewed following labs and imaging studies CBC: Recent Labs  Lab 04/04/23 0520 04/05/23 0451 04/09/23 2025  WBC 2.7* 3.5* 20.0*  NEUTROABS 1.4* 2.4 18.2*  HGB 11.3* 11.9* 10.9*  HCT 34.3* 36.2* 31.4*  MCV 98.6 98.6 92.1  PLT 241 263 299   Basic Metabolic Panel: Recent Labs  Lab 04/04/23 0520 04/05/23 0451 04/09/23 2025  NA 139 140 125*  K 3.4* 3.0* 3.3*  CL 104 102 90*  CO2 28 28 21*  GLUCOSE 91 117* 117*  BUN 14 15 57*  CREATININE  0.78 0.98 3.89*  CALCIUM 9.6 9.5 9.2   GFR: Estimated Creatinine Clearance: 21.3 mL/min (A) (by C-G formula based on SCr of 3.89 mg/dL (H)). Liver Function Tests: Recent Labs  Lab 04/09/23 2025  AST 48*  ALT 22  ALKPHOS 59  BILITOT 2.6*  PROT 7.2  ALBUMIN  3.8   CBG: Recent Labs  Lab 04/04/23 2235  GLUCAP 109*    Recent Results (from the past 240 hours)  Resp panel by RT-PCR (RSV, Flu A&B, Covid) Anterior Nasal Swab     Status: None   Collection Time: 04/09/23  8:19 PM   Specimen: Anterior Nasal Swab  Result Value Ref Range Status   SARS Coronavirus 2 by RT PCR NEGATIVE NEGATIVE Final    Comment: (NOTE) SARS-CoV-2 target nucleic acids are NOT DETECTED.  The SARS-CoV-2 RNA is generally detectable in upper respiratory specimens during the acute phase of infection. The lowest concentration of SARS-CoV-2 viral copies this assay can detect is 138 copies/mL. A negative result does not preclude SARS-Cov-2 infection and should not be used as the sole basis for treatment or other patient management decisions. A negative result may occur with  improper specimen collection/handling, submission  of specimen other than nasopharyngeal swab, presence of viral mutation(s) within the areas targeted by this assay, and inadequate number of viral copies(<138 copies/mL). A negative result must be combined with clinical observations, patient history, and epidemiological information. The expected result is Negative.  Fact Sheet for Patients:  bloggercourse.com  Fact Sheet for Healthcare Providers:  seriousbroker.it  This test is no t yet approved or cleared by the [place]  FDA and  has been authorized for detection and/or diagnosis of SARS-CoV-2 by FDA under an Emergency Use Authorization (EUA). This EUA will remain  in effect (meaning this test can be used) for the duration of the COVID-19 declaration under Section 564(b)(1) of the Act, 21 U.S.C.section 360bbb-3(b)(1), unless the authorization is terminated  or revoked sooner.       Influenza A by PCR NEGATIVE NEGATIVE Final   Influenza B by PCR NEGATIVE NEGATIVE Final    Comment: (NOTE) The Xpert Xpress SARS-CoV-2/FLU/RSV plus assay is intended as an aid in the diagnosis of influenza from Nasopharyngeal swab specimens and should not be used as a sole basis for treatment. Nasal washings and aspirates are unacceptable for Xpert Xpress SARS-CoV-2/FLU/RSV testing.  Fact Sheet for Patients: bloggercourse.com  Fact Sheet for Healthcare Providers: seriousbroker.it  This test is not yet approved or cleared by the [place]  FDA and has been authorized for detection and/or diagnosis of SARS-CoV-2 by FDA under an Emergency Use Authorization (EUA). This EUA will remain in effect (meaning this test can be used) for the duration of the COVID-19 declaration under Section 564(b)(1) of the Act, 21 U.S.C. section 360bbb-3(b)(1), unless the authorization is terminated or revoked.     Resp Syncytial Virus by PCR NEGATIVE NEGATIVE  Final    Comment: (NOTE) Fact Sheet for Patients: bloggercourse.com  Fact Sheet for Healthcare Providers: seriousbroker.it  This test is not yet approved or cleared by the [place]  FDA and has been authorized for detection and/or diagnosis of SARS-CoV-2 by FDA under an Emergency Use Authorization (EUA). This EUA will remain in effect (meaning this test can be used) for the duration of the COVID-19 declaration under Section 564(b)(1) of the Act, 21 U.S.C. section 360bbb-3(b)(1), unless the authorization is terminated or revoked.  Performed at [facility], [address] [place], [place] [address]   Blood Culture (routine  x 2)     Status: None (Preliminary result)   Collection Time: 04/09/23  8:19 PM   Specimen: Right Antecubital; Blood  Result Value Ref Range Status   Specimen Description RIGHT ANTECUBITAL  Final   Special Requests   Final    BOTTLES DRAWN AEROBIC AND ANAEROBIC Blood Culture results may not be optimal due to an inadequate volume of blood received in culture bottles   Culture   Final    NO GROWTH < 12 HOURS Performed at [facility], [address], [place], [place] [address]    Report Status PENDING  Incomplete  Blood Culture (routine x 2)     Status: None (Preliminary result)   Collection Time: 04/09/23  8:19 PM   Specimen: BLOOD RIGHT FOREARM  Result Value Ref Range Status   Specimen Description BLOOD RIGHT FOREARM  Final   Special Requests   Final    BOTTLES DRAWN AEROBIC AND ANAEROBIC Blood Culture results may not be optimal due to an inadequate volume of blood received in culture bottles   Culture   Final    NO GROWTH < 12 HOURS Performed at [facility], [address] [place], [place] [address]    Report Status PENDING  Incomplete     Radiology Studies: CT CHEST ABDOMEN PELVIS WO CONTRAST Result Date: 04/10/2023 CLINICAL DATA:  Sepsis, metastatic prostate cancer,  altered mental status, dyspnea. * Tracking Code: BO * EXAM: CT CHEST, ABDOMEN AND PELVIS WITHOUT CONTRAST TECHNIQUE: Multidetector CT imaging of the chest, abdomen and pelvis was performed following the standard protocol without IV contrast. RADIATION DOSE REDUCTION: This exam was performed according to the departmental dose-optimization program which includes automated exposure control, adjustment of the mA and/or kV according to patient size and/or use of iterative reconstruction technique. COMPARISON:  03/26/2023 FINDINGS: CT CHEST FINDINGS Cardiovascular: No significant coronary artery calcification. Global cardiac size is mildly enlarged. No pericardial effusion. Central pulmonary arteries are mildly enlarged suggesting changes of pulmonary arterial hypertension. Mild atherosclerotic calcification within the thoracic aorta. No aortic aneurysm. Right internal jugular chest port tip seen at the superior cavoatrial junction. Mediastinum/Nodes: No enlarged mediastinal, hilar, or axillary lymph nodes. Thyroid gland, trachea, and esophagus demonstrate no significant findings. Lungs/Pleura: Multiple peripheral rounded masses are seen within the lungs measuring up to 18 x 21 mm with the index lesion within the left upper lobe demonstrating central cavitation and a ground-glass halo suspicious for multiple septic emboli particularly as these appear new within the lung bases since prior examination. A small bilateral pleural effusions are present with bibasilar compressive atelectasis. No pneumothorax. No central obstructing lesion. Musculoskeletal: Numerous sclerotic metastases are seen throughout the visualized axial skeleton. No pathologic fracture identified. CT ABDOMEN PELVIS FINDINGS Hepatobiliary: Cholelithiasis without pericholecystic inflammatory change noted. Liver unremarkable in this noncontrast examination. No intra or extrahepatic biliary ductal dilation. Pancreas: Unremarkable Spleen: Unremarkable  Adrenals/Urinary Tract: The adrenal glands are unremarkable. The kidneys are normal in size and position. Multiple root hyperdense renal cysts are seen within the left kidney which were not metabolically active on PET CT examination of 10/12/2022 and further follow-up is not required. The kidneys are otherwise unremarkable. Bladder is unremarkable. Stomach/Bowel: The distal colon and rectum are fluid-filled in keeping with a diarrheal state. The stomach, small bowel, large bowel otherwise unremarkable. Appendix normal. No free intraperitoneal gas or fluid. Vascular/Lymphatic: Aortic atherosclerosis. No enlarged abdominal or pelvic lymph nodes. Reproductive: Prostate is unremarkable. Other: No abdominal wall hernia or abnormality. No abdominopelvic ascites. Musculoskeletal: Extensive  sclerotic metastases are seen throughout the axial skeleton in keeping with the patient's known history of metastatic prostate cancer. No pathologic fracture. IMPRESSION: 1. Multiple peripheral rounded masses within the lungs measuring up to 18 x 21 mm with the index lesion within the left upper lobe demonstrating central cavitation and a ground-glass Halo suspicious for multiple septic emboli, a a particularly as these appear new within the lung bases since prior examination. 2. Small bilateral pleural effusions with bibasilar compressive atelectasis. 3. 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 No pathologic fracture identified. 4. Cholelithiasis. 5. Fluid-filled distal colon and rectum in keeping with a diarrheal state. Aortic Atherosclerosis (ICD10-I70.0). Electronically Signed   By: Dorethia Molt M.D.   On: 04/10/2023 00:08   DG Chest Portable 1 View Result Date: 04/09/2023 CLINICAL DATA:  Shortness of breath EXAM: PORTABLE CHEST 1 VIEW COMPARISON:  03/29/2023 FINDINGS: There is a right chest wall power-injectable Port-A-Cath with tip at  the cavoatrial junction via a right internal jugular vein approach. Mild cardiomegaly. Mild left basilar opacity, likely atelectasis. IMPRESSION: Mild left basilar opacity, likely atelectasis. Electronically Signed   By: Franky Stanford M.D.   On: 04/09/2023 20:58    Scheduled Meds:  buPROPion   300 mg Oral Daily   furosemide   20 mg Oral BID   gabapentin   600 mg Oral TID   leptospermum manuka honey  1 Application Topical Daily   lisinopril   10 mg Oral Daily   methylPREDNISolone  (SOLU-MEDROL ) injection  80 mg Intravenous Q0600   polyethylene glycol  17 g Oral BID   vancomycin  variable dose per unstable renal function (pharmacist dosing)   Does not apply See admin instructions   Continuous Infusions:  sodium chloride      lactated ringers  150 mL/hr (04/10/23 0134)     LOS: 0 days    Time spent:  55min  Deronte Solis, DO [facility]  To contact the attending physician between 7A-7P please use Epic Chat. To contact the covering physician during after hours 7P-7A, please review Amion.   04/10/2023, 10:13 AM   *This document has been created with the assistance of dictation software. Please excuse typographical errors. *

## 2023-04-10 NOTE — Assessment & Plan Note (Addendum)
 Patient with tachypnea, hypoxemia, fever, cough with sputum production, and opacity left lower lob on CXR c/w pneumonia - given his recent hospitalization will consider this HCAP. Although sats are ok he does desaturate off BiPAP and struggles with increased WOB. On exam, difficult due to obesity and BiPAP there was expiratory wheezing  Plan Admit to progressive care  Continue cefepime  q8  Continue Vancomycin  - pharmacy consult  Solumedrol 80 mg IV  Procalcitonin for baseline  Continue BiPAP with 40% oxygen - weaning trial later today 04/10/23

## 2023-04-10 NOTE — Assessment & Plan Note (Signed)
 Patient with pressure ulcers noted last admission on ischial tuberosity and buttock left.  Plan  Continue with same treatments as at home - medihoney to ulcers daily

## 2023-04-10 NOTE — ED Notes (Signed)
Pharmacy contacted for missing meds

## 2023-04-10 NOTE — ED Notes (Signed)
Pt noted to have some difficulty swallowing meds and drinking water, pt reports having difficulty. Dr Rennis Chris messaged to request SLP

## 2023-04-10 NOTE — Assessment & Plan Note (Signed)
 At presentation patient with Cr 3.89, baseline of 0.98. Suspect related to sepsis and possible dehydration.  Plan IV fluids  F/u Bmet 24 hours

## 2023-04-11 ENCOUNTER — Encounter: Payer: Self-pay | Admitting: Internal Medicine

## 2023-04-11 DIAGNOSIS — A4902 Methicillin resistant Staphylococcus aureus infection, unspecified site: Secondary | ICD-10-CM | POA: Diagnosis not present

## 2023-04-11 DIAGNOSIS — R918 Other nonspecific abnormal finding of lung field: Secondary | ICD-10-CM | POA: Diagnosis not present

## 2023-04-11 DIAGNOSIS — Y95 Nosocomial condition: Secondary | ICD-10-CM | POA: Diagnosis not present

## 2023-04-11 DIAGNOSIS — B9562 Methicillin resistant Staphylococcus aureus infection as the cause of diseases classified elsewhere: Secondary | ICD-10-CM | POA: Diagnosis not present

## 2023-04-11 DIAGNOSIS — I76 Septic arterial embolism: Secondary | ICD-10-CM

## 2023-04-11 DIAGNOSIS — R7881 Bacteremia: Secondary | ICD-10-CM | POA: Diagnosis not present

## 2023-04-11 DIAGNOSIS — J189 Pneumonia, unspecified organism: Secondary | ICD-10-CM | POA: Diagnosis not present

## 2023-04-11 LAB — PROTIME-INR
INR: 4 — ABNORMAL HIGH (ref 0.8–1.2)
INR: 4.3 (ref 0.8–1.2)
Prothrombin Time: 39.5 s — ABNORMAL HIGH (ref 11.4–15.2)
Prothrombin Time: 41.1 s — ABNORMAL HIGH (ref 11.4–15.2)

## 2023-04-11 LAB — BASIC METABOLIC PANEL
Anion gap: 14 (ref 5–15)
BUN: 61 mg/dL — ABNORMAL HIGH (ref 8–23)
CO2: 19 mmol/L — ABNORMAL LOW (ref 22–32)
Calcium: 9.8 mg/dL (ref 8.9–10.3)
Chloride: 100 mmol/L (ref 98–111)
Creatinine, Ser: 2.1 mg/dL — ABNORMAL HIGH (ref 0.61–1.24)
GFR, Estimated: 34 mL/min — ABNORMAL LOW (ref >60)
Glucose, Bld: 167 mg/dL — ABNORMAL HIGH (ref 70–99)
Potassium: 2.8 mmol/L — ABNORMAL LOW (ref 3.5–5.1)
Sodium: 133 mmol/L — ABNORMAL LOW (ref 135–145)

## 2023-04-11 LAB — URINE CULTURE: Culture: NO GROWTH

## 2023-04-11 LAB — VANCOMYCIN, RANDOM: Vancomycin Rm: 11 ug/mL

## 2023-04-11 MED ORDER — POTASSIUM CHLORIDE CRYS ER 20 MEQ PO TBCR
40.0000 meq | EXTENDED_RELEASE_TABLET | ORAL | Status: AC
Start: 2023-04-11 — End: 2023-04-11
  Administered 2023-04-11 (×2): 40 meq via ORAL
  Filled 2023-04-11 (×2): qty 2

## 2023-04-11 MED ORDER — VANCOMYCIN HCL 1500 MG/300ML IV SOLN
1500.0000 mg | Freq: Once | INTRAVENOUS | Status: AC
  Administered 2023-04-11: 1500 mg via INTRAVENOUS
  Filled 2023-04-11: qty 300

## 2023-04-11 NOTE — Progress Notes (Signed)
 PROGRESS NOTE    [name]  FMW:[id number] DOB: 10/24/1955 DOA: 04/09/2023 PCP: Eliverto Bette Hover, MD  Chief Complaint  Patient presents with   Shortness of Breath    Hospital Course:  [name] is 68 y.o. male with stage IV prostate cancer, recurrent VTE, hypertension, PVD status post AKA, decubitus ulcers, who was admitted on 1/18 with shortness of breath and was found to have recurrent DVT despite Eliquis  and then Coumadin  (not technically a failure as he was not bridged.)  He had been unable to afford Lovenox  as an outpatient.  During hospitalization he was noted to have volume overload and was diuresed.  Stay was also complicated by ileus for stool impaction that was treated aggressively with a bowel regimen.  He was discharged in stable condition on 1/31.  He returned to the ED on 2/4 due to increasing confusion and shortness of breath.  He was found to be saturating 89% on room air.  EMS placed patient on CPAP prior to arrival.  In the ED he was found to be septic with fever of 101.9, respiratory rate 31, leukocytosis 20K, and hypoxia.  Chest x-ray revealed left basilar opacity.  He was placed on BiPAP due to work of breathing and hypoxemia.  He failed weaning trial due to work of breathing though he did have good oxygen saturations.  He was admitted for management of healthcare associated pneumonia and sepsis.  Subjective: Breathing somewhat improved.  Still dyspneic with minimal exertion and when speaking.  Does have multiple friends visiting and is in good spirits.  Endorsing considerable right-sided flank pain, he reports is now radiating to the left side as well.  Juliene is at bedside.  Patient endorses he would like for him to be POA.  They are working on the official paperwork but for now: his name is [name].  Objective: Vitals:   04/11/23 0330 04/11/23 0400 04/11/23 0600 04/11/23 0700  BP:  104/69  117/73  Pulse:  74  73  Resp:  14  15  Temp: 97.7  F (36.5 C)  98 F (36.7 C)   TempSrc:      SpO2:  95%  97%  Weight:      Height:        Intake/Output Summary (Last 24 hours) at 04/11/2023 0929 Last data filed at 04/11/2023 0022 Gross per 24 hour  Intake --  Output 2650 ml  Net -2650 ml   Filed Weights   04/09/23 2019  Weight: 136 kg    Examination: General exam: Appears calm, some distress Respiratory system: Shortened expiratory phase, shallow respirations Cardiovascular system: S1 & S2 heard, RRR.  Gastrointestinal system: Abdomen is less distended, right side flank tender to palpation Neuro: Alert and oriented. No focal neurological deficits. Extremities: Bilateral AKA  Skin: No rashes, lesions Psychiatry: Demonstrates appropriate judgement and insight. Mood & affect appropriate for situation.   Assessment & Plan:  Principal Problem:   HAP (hospital-acquired pneumonia) Active Problems:   Severe sepsis (HCC)   PE (pulmonary thromboembolism) (HCC)   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   Ogilvie syndrome   AKI (acute kidney injury) (HCC)   Prostate cancer metastatic to bone [facility])   Essential hypertension    Severe sepsis - Criteria met with hypoxemia, fever, AKI, leukocytosis, tachypnea.  Source: Pneumonia and bacteremia - Antibiotics as below  MRSA bacteremia - Patient is on vancomycin  - ID consulted - He does have a port in place and there are  multiple nodules in the lungs concerning for septic emboli - Echocardiogram without evidence of endocarditis, needs a TEE but respiratory status is unlikely to tolerate that at this time. - For now we will remove the port and send tip for culture (IR consulted) - May need to remove the port for line holiday.  Healthcare associated pneumonia +/- septic emboli - Left lower lobe opacity on CXR - Given recent discharge from hospital 4 days prior we will treat for healthcare associated pneumonia. - Continue to wean oxygen as tolerated, is intermittently  requiring BiPAP due to work of breathing but maintaining O2 sats. - Will continue with cefepime  and vancomycin  - Continue with Solu-Medrol  - Procalcitonin: 5.8  AKI - Baseline creatinine 0.98, creatinine 3.89 on arrival - Improving some, continue to trend - Continue with gentle NS MIVF  History of Ogilivie syndrome - Currently having bowel movements, continue bowel regimen  Pressure ulcers of skin - Last noted on ischial tuberosity and left buttock - Continue with Medihoney to ulcers - Wound nurse consult  Pulmonary embolism - Has had recurrent PEs - Continue warfarin with pharmacy consult for dosing -- Supratherapeutic INR today: 4.0. Pharmacy to manage. Hold todays dose.  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  Currently necessitates IV antibiotics. Further work up. Will obtain PT/OT consults when medically appropriate and then discussed discharge planning with TOC at that time.    Consultants:  ID  Procedures:    Antimicrobials:  Anti-infectives (From admission, onward)    Start     Dose/Rate Route Frequency Ordered Stop   04/11/23 1000  vancomycin  (VANCOREADY) IVPB 1500 mg/300 mL        1,500 mg 75 mL/hr over 240 Minutes Intravenous  Once 04/11/23 0828     04/10/23 0308  vancomycin  variable dose per unstable renal function (pharmacist dosing)         Does not apply See admin instructions 04/10/23 0308      04/10/23 0145  vancomycin  (VANCOREADY) IVPB 1500 mg/300 mL        1,500 mg 100 mL/hr over 180 Minutes Intravenous  Once 04/10/23 0131 04/10/23 0546   04/10/23 0130  vancomycin  (VANCOCIN ) IVPB 1000 mg/200 mL premix  Status:  Discontinued        1,000 mg 200 mL/hr over 60 Minutes Intravenous  Once 04/10/23 0120 04/10/23 0131   04/09/23 2100  vancomycin  (VANCOCIN ) IVPB 1000 mg/200 mL premix        1,000 mg 200 mL/hr over 60 Minutes Intravenous  Once 04/09/23 2047 04/10/23 0113   04/09/23 2100  ceFEPIme  (MAXIPIME ) 2 g in sodium chloride  0.9 % 100 mL IVPB        2 g 200 mL/hr over 30 Minutes Intravenous  Once 04/09/23 2047 04/10/23 0113       Data Reviewed: I have personally reviewed following labs and imaging studies CBC: Recent Labs  Lab 04/05/23 0451 04/09/23 2025 04/10/23 1051  WBC 3.5* 20.0* 16.3*  NEUTROABS 2.4  18.2* 15.7*  HGB 11.9* 10.9* 10.8*  HCT 36.2* 31.4* 31.4*  MCV 98.6 92.1 91.3  PLT 263 299 229   Basic Metabolic Panel: Recent Labs  Lab 04/05/23 0451 04/09/23 2025 04/10/23 1051 04/11/23 0603  NA 140 125* 129* 133*  K 3.0* 3.3* 3.2* 2.8*  CL 102 90* 96* 100  CO2 28 21* 20* 19*  GLUCOSE 117* 117* 171* 167*  BUN 15 57* 63* 61*  CREATININE 0.98 3.89* 3.11* 2.10*  CALCIUM 9.5 9.2 9.5 9.8   GFR: Estimated Creatinine Clearance: 39.4 mL/min (A) (by C-G formula based on SCr of 2.1 mg/dL (H)). Liver Function Tests: Recent Labs  Lab 04/09/23 2025 04/10/23 1051  AST 48* 44*  ALT 22 22  ALKPHOS 59 67  BILITOT 2.6* 2.1*  PROT 7.2 6.9  ALBUMIN  3.8 3.3*   CBG: Recent Labs  Lab 04/04/23 2235  GLUCAP 109*    Recent Results (from the past 240 hours)  Resp panel by RT-PCR (RSV, Flu A&B, Covid) Anterior Nasal Swab     Status: None   Collection Time: 04/09/23  8:19 PM   Specimen: Anterior Nasal Swab  Result Value Ref Range Status   SARS Coronavirus 2 by RT PCR NEGATIVE NEGATIVE Final    Comment: (NOTE) SARS-CoV-2 target nucleic acids are NOT  DETECTED.  The SARS-CoV-2 RNA is generally detectable in upper respiratory specimens during the acute phase of infection. The lowest concentration of SARS-CoV-2 viral copies this assay can detect is 138 copies/mL. A negative result does not preclude SARS-Cov-2 infection and should not be used as the sole basis for treatment or other patient management decisions. A negative result may occur with  improper specimen collection/handling, submission of specimen other than nasopharyngeal swab, presence of viral mutation(s) within the areas targeted by this assay, and inadequate number of viral copies(<138 copies/mL). A negative result must be combined with clinical observations, patient history, and epidemiological information. The expected result is Negative.  Fact Sheet for Patients:  bloggercourse.com  Fact Sheet for Healthcare Providers:  seriousbroker.it  This test is no t yet approved or cleared by the [place]  FDA and  has been authorized for detection and/or diagnosis of SARS-CoV-2 by FDA under an Emergency Use Authorization (EUA). This EUA will remain  in effect (meaning this test can be used) for the duration of the COVID-19 declaration under Section 564(b)(1) of the Act, 21 U.S.C.section 360bbb-3(b)(1), unless the authorization is terminated  or revoked sooner.       Influenza A by PCR NEGATIVE NEGATIVE Final   Influenza B by PCR NEGATIVE NEGATIVE Final    Comment: (NOTE) The Xpert Xpress SARS-CoV-2/FLU/RSV plus assay is intended as an aid in the diagnosis of influenza from Nasopharyngeal swab specimens and should not be used as a sole basis for treatment. Nasal washings and aspirates are unacceptable for Xpert Xpress SARS-CoV-2/FLU/RSV testing.  Fact Sheet for Patients: bloggercourse.com  Fact Sheet for Healthcare Providers: seriousbroker.it  This test is not yet  approved or cleared by the [place]  FDA and has been authorized for detection and/or diagnosis of SARS-CoV-2 by FDA under an Emergency Use Authorization (EUA). This EUA will remain in effect (meaning this test can be used) for the duration of the COVID-19 declaration under Section 564(b)(1) of the Act, 21 U.S.C. section 360bbb-3(b)(1), unless the authorization is terminated or revoked.     Resp Syncytial Virus by PCR NEGATIVE NEGATIVE Final    Comment: (NOTE) Fact Sheet for Patients: bloggercourse.com  Fact Sheet for Healthcare Providers:  seriousbroker.it  This test is not yet approved or cleared by the [place]  FDA and has been authorized for detection and/or diagnosis of SARS-CoV-2 by FDA under an Emergency Use Authorization (EUA). This EUA will remain in effect (meaning this test can be used) for the duration of the COVID-19 declaration under Section 564(b)(1) of the Act, 21 U.S.C. section 360bbb-3(b)(1), unless the authorization is terminated or revoked.  Performed at [facility], [address], [place], [place] [address]   Blood Culture (routine x 2)     Status: Abnormal (Preliminary result)   Collection Time: 04/09/23  8:19 PM   Specimen: Right Antecubital; Blood  Result Value Ref Range Status   Specimen Description   Final    RIGHT ANTECUBITAL BLOOD Performed at [facility], [address], [place], [place] [address]    Special Requests   Final    BOTTLES DRAWN AEROBIC AND ANAEROBIC Blood Culture results may not be optimal due to an inadequate volume of blood received in culture bottles Performed at [facility], [address] [place], [place] [address]    Culture  Setup Time   Final    GRAM POSITIVE COCCI IN BOTH AEROBIC AND ANAEROBIC BOTTLES CRITICAL RESULT CALLED TO, READ BACK BY AND VERIFIED WITHBETHA MOSE BLEW [facility] 1137 04/10/23 HNM Performed at [facility],  [address], [place], [place] [address]    Culture (A)  Final    STAPHYLOCOCCUS AUREUS SUSCEPTIBILITIES TO FOLLOW Performed at [facility], [address], [place], [place] [address]    Report Status PENDING  Incomplete  Blood Culture (routine x 2)     Status: None (Preliminary result)   Collection Time: 04/09/23  8:19 PM   Specimen: BLOOD RIGHT FOREARM  Result Value Ref Range Status   Specimen Description BLOOD RIGHT FOREARM  Final   Special Requests   Final    BOTTLES DRAWN AEROBIC AND ANAEROBIC Blood Culture results may not be optimal due to an inadequate volume of blood received in culture bottles   Culture  Setup Time   Final    GRAM POSITIVE COCCI AEROBIC BOTTLE ONLY CRITICAL VALUE NOTED.  VALUE IS CONSISTENT WITH PREVIOUSLY REPORTED AND CALLED VALUE.    Culture   Final    NO GROWTH 2 DAYS Performed at [facility], [address] [place], [place] [address]    Report Status PENDING  Incomplete  Blood Culture ID Panel (Reflexed)     Status: Abnormal   Collection Time: 04/09/23  8:19 PM  Result Value Ref Range Status   Enterococcus faecalis NOT DETECTED NOT DETECTED Final   Enterococcus Faecium NOT DETECTED NOT DETECTED Final   Listeria monocytogenes NOT DETECTED NOT DETECTED Final   Staphylococcus species DETECTED (A) NOT DETECTED Final    Comment: CRITICAL RESULT CALLED TO, READ BACK BY AND VERIFIED WITH: MOSE BLEW PHARMD 1137 04/10/23 HNM    Staphylococcus aureus (BCID) DETECTED (A) NOT DETECTED Final    Comment: Methicillin (oxacillin)-resistant Staphylococcus aureus (MRSA). MRSA is predictably resistant to beta-lactam antibiotics (except ceftaroline). Preferred therapy is vancomycin  unless clinically contraindicated. Patient requires contact precautions if  hospitalized. CRITICAL RESULT CALLED TO, READ BACK BY AND VERIFIED WITH: MOSE BLEW PHARMD 1137 04/10/23 HNM    Staphylococcus epidermidis NOT DETECTED NOT DETECTED Final    Staphylococcus lugdunensis NOT DETECTED NOT DETECTED Final   Streptococcus species NOT DETECTED NOT DETECTED Final   Streptococcus agalactiae NOT DETECTED NOT DETECTED Final   Streptococcus pneumoniae  NOT DETECTED NOT DETECTED Final   Streptococcus pyogenes NOT DETECTED NOT DETECTED Final   A.calcoaceticus-baumannii NOT DETECTED NOT DETECTED Final   Bacteroides fragilis NOT DETECTED NOT DETECTED Final   Enterobacterales NOT DETECTED NOT DETECTED Final   Enterobacter cloacae complex NOT DETECTED NOT DETECTED Final   Escherichia coli NOT DETECTED NOT DETECTED Final   Klebsiella aerogenes NOT DETECTED NOT DETECTED Final   Klebsiella oxytoca NOT DETECTED NOT DETECTED Final   Klebsiella pneumoniae NOT DETECTED NOT DETECTED Final   Proteus species NOT DETECTED NOT DETECTED Final   Salmonella species NOT DETECTED NOT DETECTED Final   Serratia marcescens NOT DETECTED NOT DETECTED Final   Haemophilus influenzae NOT DETECTED NOT DETECTED Final   Neisseria meningitidis NOT DETECTED NOT DETECTED Final   Pseudomonas aeruginosa NOT DETECTED NOT DETECTED Final   Stenotrophomonas maltophilia NOT DETECTED NOT DETECTED Final   Candida albicans NOT DETECTED NOT DETECTED Final   Candida auris NOT DETECTED NOT DETECTED Final   Candida glabrata NOT DETECTED NOT DETECTED Final   Candida krusei NOT DETECTED NOT DETECTED Final   Candida parapsilosis NOT DETECTED NOT DETECTED Final   Candida tropicalis NOT DETECTED NOT DETECTED Final   Cryptococcus neoformans/gattii NOT DETECTED NOT DETECTED Final   Meth resistant mecA/C and MREJ DETECTED (A) NOT DETECTED Final    Comment: CRITICAL RESULT CALLED TO, READ BACK BY AND VERIFIED WITHBETHA MOSE BLEW PHARMD 1137 04/10/23 HNM Performed at [facility], [address] [place], [place] [address]   Respiratory (~20 pathogens) panel by PCR     Status: None   Collection Time: 04/10/23  1:27 AM   Specimen: Nasopharyngeal Swab; Respiratory  Result Value Ref  Range Status   Adenovirus NOT DETECTED NOT DETECTED Final   Coronavirus 229E NOT DETECTED NOT DETECTED Final    Comment: (NOTE) The Coronavirus on the Respiratory Panel, DOES NOT test for the novel  Coronavirus (2019 nCoV)    Coronavirus HKU1 NOT DETECTED NOT DETECTED Final   Coronavirus NL63 NOT DETECTED NOT DETECTED Final   Coronavirus OC43 NOT DETECTED NOT DETECTED Final   Metapneumovirus NOT DETECTED NOT DETECTED Final   Rhinovirus / Enterovirus NOT DETECTED NOT DETECTED Final   Influenza A NOT DETECTED NOT DETECTED Final   Influenza B NOT DETECTED NOT DETECTED Final   Parainfluenza Virus 1 NOT DETECTED NOT DETECTED Final   Parainfluenza Virus 2 NOT DETECTED NOT DETECTED Final   Parainfluenza Virus 3 NOT DETECTED NOT DETECTED Final   Parainfluenza Virus 4 NOT DETECTED NOT DETECTED Final   Respiratory Syncytial Virus NOT DETECTED NOT DETECTED Final   Bordetella pertussis NOT DETECTED NOT DETECTED Final   Bordetella Parapertussis NOT DETECTED NOT DETECTED Final   Chlamydophila pneumoniae NOT DETECTED NOT DETECTED Final   Mycoplasma pneumoniae NOT DETECTED NOT DETECTED Final    Comment: Performed at [facility], [address] [place], [place] [address]  C Difficile Quick Screen w PCR reflex     Status: None   Collection Time: 04/10/23  2:00 PM   Specimen: STOOL  Result Value Ref Range Status   C Diff antigen NEGATIVE NEGATIVE Final   C Diff toxin NEGATIVE NEGATIVE Final   C Diff interpretation No C. difficile detected.  Final    Comment: Performed at [facility], [address] [place], [place] [address]  MRSA Next Gen by PCR, Nasal     Status: Abnormal   Collection Time: 04/10/23  7:18 PM   Specimen: Nasal Mucosa; Nasal Swab  Result  Value Ref Range Status   MRSA by PCR Next Gen DETECTED (A) NOT DETECTED Final    Comment: RESULT CALLED TO, READ BACK BY AND VERIFIED WITH:  JACOB MOORE AT 2346 04/10/23 JG (NOTE) The GeneXpert MRSA Assay (FDA approved for  NASAL specimens only), is one component of a comprehensive MRSA colonization surveillance program. It is not intended to diagnose MRSA infection nor to guide or monitor treatment for MRSA infections. Test performance is not FDA approved in patients less than 53 years old. Performed at [facility], [address], [place], [place] [address]      Radiology Studies: ECHOCARDIOGRAM COMPLETE Result Date: 04/10/2023    ECHOCARDIOGRAM REPORT   Patient Name:   Rawlin MAKYE RADLE Date of Exam: 04/10/2023 Medical Rec #:  [id number]            Height:       58.0 in Accession #:    [id number]           Weight:       299.8 lb Date of Birth:  [birth year]            BSA:          2.163 m Patient Age:    67 years             BP:           132/64 mmHg Patient Gender: M                    HR:           83 bpm. Exam Location:  [facility] Procedure: 2D Echo, Cardiac Doppler and Color Doppler Indications:     Endocarditis I38  History:         Patient has prior history of Echocardiogram examinations, most                  recent 04/23/2023. Risk Factors:Hypertension.  Sonographer:     Christopher Furnace Referring Phys:  [id number] LORANE POLAND Diagnosing Phys: Evalene Lunger MD  Sonographer Comments: Technically challenging study due to limited acoustic windows, no apical window and no subcostal window. IMPRESSIONS  1. Left ventricular ejection fraction, by estimation, is 55 to 60%. The left ventricle has normal function. The left ventricle has no regional wall motion abnormalities. The left ventricular internal cavity size was mildly dilated. Left ventricular diastolic parameters are indeterminate.  2. Right ventricular systolic function is normal. The right ventricular size is normal.  3. Left atrial size was mildly dilated.  4. The mitral valve is normal in structure. No evidence of mitral valve regurgitation. No evidence of mitral stenosis.  5. The aortic valve is normal in structure. Aortic valve regurgitation is not  visualized. No aortic stenosis is present.  6. There is mild dilatation of the aortic root, measuring 44 mm. There is moderate dilatation of the ascending aorta, measuring 46 mm.  7. The inferior vena cava is normal in size with greater than 50% respiratory variability, suggesting right atrial pressure of 3 mmHg. FINDINGS  Left Ventricle: Left ventricular ejection fraction, by estimation, is 55 to 60%. The left ventricle has normal function. The left ventricle has no regional wall motion abnormalities. The left ventricular internal cavity size was mildly dilated. There is  no left ventricular hypertrophy. Left ventricular diastolic parameters are indeterminate. Right Ventricle: The right ventricular size is normal. No increase in right ventricular wall thickness. Right ventricular systolic function is normal. Left Atrium: Left atrial size  was mildly dilated. Right Atrium: Right atrial size was normal in size. Pericardium: There is no evidence of pericardial effusion. Mitral Valve: The mitral valve is normal in structure. There is mild calcification of the mitral valve leaflet(s). No evidence of mitral valve regurgitation. No evidence of mitral valve stenosis. Tricuspid Valve: The tricuspid valve is normal in structure. Tricuspid valve regurgitation is not demonstrated. No evidence of tricuspid stenosis. Aortic Valve: The aortic valve is normal in structure. Aortic valve regurgitation is not visualized. No aortic stenosis is present. Pulmonic Valve: The pulmonic valve was normal in structure. Pulmonic valve regurgitation is not visualized. No evidence of pulmonic stenosis. Aorta: The aortic root is normal in size and structure. There is mild dilatation of the aortic root, measuring 44 mm. There is moderate dilatation of the ascending aorta, measuring 46 mm. Venous: The inferior vena cava is normal in size with greater than 50% respiratory variability, suggesting right atrial pressure of 3 mmHg. IAS/Shunts: No atrial  level shunt detected by color flow Doppler.  LEFT VENTRICLE PLAX 2D LVIDd:         5.70 cm LVIDs:         3.40 cm LV PW:         0.90 cm LV IVS:        1.10 cm LVOT diam:     2.20 cm LVOT Area:     3.80 cm  LEFT ATRIUM         Index LA diam:    4.30 cm 1.99 cm/m   AORTA Ao Root diam: 4.70 cm  SHUNTS Systemic Diam: 2.20 cm Evalene Lunger MD Electronically signed by Evalene Lunger MD Signature Date/Time: 04/10/2023/4:34:27 PM    Final    DG Chest 1 View Result Date: 04/10/2023 CLINICAL DATA:  Dyspnea. EXAM: CHEST  1 VIEW COMPARISON:  04/09/2023 FINDINGS: Single-view of the chest demonstrates very low lung volumes. Again noted is a right jugular Port-A-Cath and the tip is near the superior cavoatrial junction but poorly characterized. Cardiac silhouette is prominent due to low lung volumes. Again noted are postsurgical changes in the lower cervical spine and cervicothoracic junction. Sclerotic lesions are noted in the left clavicle and compatible with history of prostate cancer. IMPRESSION: 1. Low lung volumes without acute findings. 2. Sclerotic lesions in the left clavicle compatible with history of prostate cancer. Electronically Signed   By: Juliene Balder M.D.   On: 04/10/2023 14:47   CT CHEST ABDOMEN PELVIS WO CONTRAST Result Date: 04/10/2023 CLINICAL DATA:  Sepsis, metastatic prostate cancer, altered mental status, dyspnea. * Tracking Code: BO * EXAM: CT CHEST, ABDOMEN AND PELVIS WITHOUT CONTRAST TECHNIQUE: Multidetector CT imaging of the chest, abdomen and pelvis was performed following the standard protocol without IV contrast. RADIATION DOSE REDUCTION: This exam was performed according to the departmental dose-optimization program which includes automated exposure control, adjustment of the mA and/or kV according to patient size and/or use of iterative reconstruction technique. COMPARISON:  03/26/2023 FINDINGS: CT CHEST FINDINGS Cardiovascular: No significant coronary artery calcification. Global cardiac size  is mildly enlarged. No pericardial effusion. Central pulmonary arteries are mildly enlarged suggesting changes of pulmonary arterial hypertension. Mild atherosclerotic calcification within the thoracic aorta. No aortic aneurysm. Right internal jugular chest port tip seen at the superior cavoatrial junction. Mediastinum/Nodes: No enlarged mediastinal, hilar, or axillary lymph nodes. Thyroid gland, trachea, and esophagus demonstrate no significant findings. Lungs/Pleura: Multiple peripheral rounded masses are seen within the lungs measuring up to 18 x 21 mm with the index lesion  within the left upper lobe demonstrating central cavitation and a ground-glass halo suspicious for multiple septic emboli particularly as these appear new within the lung bases since prior examination. A small bilateral pleural effusions are present with bibasilar compressive atelectasis. No pneumothorax. No central obstructing lesion. Musculoskeletal: Numerous sclerotic metastases are seen throughout the visualized axial skeleton. No pathologic fracture identified. CT ABDOMEN PELVIS FINDINGS Hepatobiliary: Cholelithiasis without pericholecystic inflammatory change noted. Liver unremarkable in this noncontrast examination. No intra or extrahepatic biliary ductal dilation. Pancreas: Unremarkable Spleen: Unremarkable Adrenals/Urinary Tract: The adrenal glands are unremarkable. The kidneys are normal in size and position. Multiple root hyperdense renal cysts are seen within the left kidney which were not metabolically active on PET CT examination of 10/12/2022 and further follow-up is not required. The kidneys are otherwise unremarkable. Bladder is unremarkable. Stomach/Bowel: The distal colon and rectum are fluid-filled in keeping with a diarrheal state. The stomach, small bowel, large bowel otherwise unremarkable. Appendix normal. No free intraperitoneal gas or fluid. Vascular/Lymphatic: Aortic atherosclerosis. No enlarged abdominal or pelvic  lymph nodes. Reproductive: Prostate is unremarkable. Other: No abdominal wall hernia or abnormality. No abdominopelvic ascites. 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 No pathologic fracture. IMPRESSION: 1. Multiple peripheral rounded masses within the lungs measuring up to 18 x 21 mm with the index lesion within the left upper lobe demonstrating central cavitation and a ground-glass Halo suspicious for multiple septic emboli, a a particularly as these appear new within the lung bases since prior examination. 2. Small bilateral pleural effusions with bibasilar compressive atelectasis. 3. 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 No pathologic fracture identified. 4. Cholelithiasis. 5. Fluid-filled distal colon and rectum in keeping with a diarrheal state. Aortic Atherosclerosis (ICD10-I70.0). Electronically Signed   By: Dorethia Molt M.D.   On: 04/10/2023 00:08   DG Chest Portable 1 View Result Date: 04/09/2023 CLINICAL DATA:  Shortness of breath EXAM: PORTABLE CHEST 1 VIEW COMPARISON:  03/29/2023 FINDINGS: There is a right chest wall power-injectable Port-A-Cath with tip at the cavoatrial junction via a right internal jugular vein approach. Mild cardiomegaly. Mild left basilar opacity, likely atelectasis. IMPRESSION: Mild left basilar opacity, likely atelectasis. Electronically Signed   By: Franky Stanford M.D.   On: 04/09/2023 20:58    Scheduled Meds:  buPROPion   300 mg Oral Daily   furosemide   20 mg Oral BID   gabapentin   600 mg Oral TID   ipratropium-albuterol   3 mL Nebulization Q4H   leptospermum manuka honey  1 Application Topical Daily   lisinopril   10 mg Oral Daily   methylPREDNISolone  (SOLU-MEDROL ) injection  80 mg Intravenous Q0600   polyethylene glycol  17 g Oral BID   vancomycin   variable dose per unstable renal function (pharmacist dosing)   Does not apply See admin instructions   Warfarin - Pharmacist Dosing Inpatient   Does not apply q1600   Continuous Infusions:  vancomycin        LOS: 1 day    Time spent:  55min  Izell Labat, DO [facility]  To contact the attending physician between 7A-7P please use Epic Chat. To contact the covering physician during after hours 7P-7A, please review Amion.   04/11/2023, 9:29 AM   *This document has been created with the assistance of dictation software. Please excuse typographical errors. *

## 2023-04-11 NOTE — ED Notes (Signed)
 Pt called out wanting to get off of the bedpan that he was on. This tech removed bedpan from under pt and cleaned pt up. Pt has clean chuck under him and has no other needs at the moment.

## 2023-04-11 NOTE — ED Notes (Signed)
 Breakfast tray provided. Assisted with set up

## 2023-04-11 NOTE — Consult Note (Signed)
 Chief Complaint: Patient was seen in consultation today for bacteremia  Referring Physician(s): Dr. Lorane Poland  Supervising Physician: Philip Cornet  Patient Status: [facility] - In-pt  History of Present Illness: [name] is a 68 y.o. male with past medical history of HTN, PED, lymphedema s/p bilateral AKA with advanced prostate cancer who presents to [facility] ED with confusion and shortness of breath. Patient known to IR from prior [facility] placement in 11/2022.  As part of work-up for HCAP and sepsis, patient found to have bacteremia.   IR consulted for Port removal.    Past Medical History:  Diagnosis Date   Depression    History of blood clots 08/13/2011   History of left knee replacement    HTN (hypertension)    Lymphedema    lt leg-per pt   PE (pulmonary thromboembolism) (HCC)    Pressure injury of skin, unspecified injury stage, unspecified location    S/P AKA (above knee amputation) bilateral (HCC)    rt 11/24/07 and lt 01/11/17    Past Surgical History:  Procedure Laterality Date   ACHILLES TENDON SURGERY Left    per pt   BIOPSY  04/03/2023   Procedure: BIOPSY;  Surgeon: Jinny Carmine, MD;  Location: [facility] ENDOSCOPY;  Service: Endoscopy;;   bone clip removal Left 10/04/2015   knee   CARDIAC SURGERY  11/2012   cath.-per pt   CERVICAL FUSION  07/28/2015   per pt   COLONOSCOPY WITH PROPOFOL  N/A 04/03/2023   Procedure: COLONOSCOPY WITH PROPOFOL ;  Surgeon: Jinny Carmine, MD;  Location: [facility] ENDOSCOPY;  Service: Endoscopy;  Laterality: N/A;   EPIGASTRIC HERNIA REPAIR     per pt   hematoma removal Left 11/02/2015   knee   HIP SURGERY Right    x8 per pt   IR IMAGING GUIDED PORT INSERTION  11/14/2022   KNEE FUSION Right    KNEE JOINT MANIPULATION Left 09/11/2011   KNEE SURGERY Left    x3   leg stump Left 03/2017   leg surgery-per pt   NECK SURGERY  02/2015   plates-per pt   REPLACEMENT TOTAL KNEE Right    REPLACEMENT TOTAL KNEE Left 07/02/2011   again in  07/10/2011 per pt   s/p of bilateral AKA Right    screen filter removal  10/20/2008   for embolism per pt   screen filter replacement     x3   SHOULDER SURGERY Right    x2-per pt   SPINE SURGERY  02/11/2015   vertebra spacer-per pt   toe nail removal Right    per pt   VARICOSE VEIN SURGERY     x5-per pt    Allergies: Bacitracin, Vancomycin , Ketorolac, and Spironolactone  Medications: Prior to Admission medications   Medication Sig Start Date End Date Taking? Authorizing Provider  buPROPion  (WELLBUTRIN  XL) 300 MG 24 hr tablet Take 300 mg by mouth daily. 07/26/22  Yes [provider]  darolutamide  (NUBEQA ) 300 MG tablet Take 2 tablets (600 mg total) by mouth 2 (two) times daily with a meal. 03/29/23  Yes Brahmanday, Govinda R, MD  diphenoxylate -atropine  (LOMOTIL ) 2.5-0.025 MG tablet Take 1 tablet by mouth 4 (four) times daily as needed for diarrhea or loose stools. 03/11/23  Yes Caleen Qualia, MD  gabapentin  (NEURONTIN ) 300 MG capsule Take 2 capsules (600 mg total) by mouth 3 (three) times daily. 02/15/23 06/15/23 Yes Borders, Joshua R, NP  leptospermum manuka honey (MEDIHONEY) PSTE paste Apply 1 Application topically daily. 03/11/23  Yes Amin, Sumayya,  MD  lidocaine -prilocaine  (EMLA ) cream Apply 1 Application topically as needed. 11/27/22  Yes Brahmanday, Govinda R, MD  lisinopril  (ZESTRIL ) 10 MG tablet Take 1 tablet (10 mg total) by mouth daily. 04/06/23  Yes Barbarann Nest, MD  ondansetron  (ZOFRAN ) 8 MG tablet Take 1 tablet (8 mg total) by mouth every 8 (eight) hours as needed for nausea or vomiting. 11/29/22  Yes Brahmanday, Govinda R, MD  oxyCODONE -acetaminophen  (PERCOCET/ROXICET) 5-325 MG tablet Take 1 tablet by mouth every 12 (twelve) hours as needed for moderate pain (pain score 4-6). 01/02/23  Yes Borders, Fonda SAUNDERS, NP  polyethylene glycol powder (GLYCOLAX /MIRALAX ) 17 GM/SCOOP powder Take 17 g by mouth 2 (two) times daily. Mix as directed. 04/05/23  Yes Barbarann Nest, MD   psyllium (HYDROCIL/METAMUCIL) 95 % PACK Take 1 packet by mouth daily. 04/06/23  Yes Barbarann Nest, MD  senna-docusate (SENOKOT-S) 8.6-50 MG tablet Take 2 tablets by mouth 2 (two) times daily as needed for mild constipation. 10/01/22  Yes Zhang, Dekui, MD  warfarin (COUMADIN ) 5 MG tablet Take 5-7.5 mg by mouth daily.   Yes [provider]  warfarin (COUMADIN ) 5 MG tablet Take 5 mg by mouth daily at 4 PM. 12/22/22  Yes [provider]  furosemide  (LASIX ) 20 MG tablet Take 1 tablet (20 mg total) by mouth 2 (two) times daily. 04/05/23 04/04/24  Barbarann Nest, MD  lactulose  (CHRONULAC ) 10 GM/15ML solution Take 30 mLs (20 g total) by mouth 2 (two) times daily as needed for mild constipation. 04/05/23   Barbarann Nest, MD     Family History  Problem Relation Age of Onset   Alzheimer's disease Mother    Lung cancer Father     Social History   Socioeconomic History   Marital status: Single    Spouse name: Not on file   Number of children: Not on file   Years of education: Not on file   Highest education level: Not on file  Occupational History   Not on file  Tobacco Use   Smoking status: Never   Smokeless tobacco: Former  Vaping Use   Vaping status: Never Used  Substance and Sexual Activity   Alcohol use: Yes    Alcohol/week: 3.0 standard drinks of alcohol    Types: 3 Glasses of wine per week   Drug use: Never   Sexual activity: Not on file  Other Topics Concern   Not on file  Social History Narrative   Not on file   Social Drivers of Health   Financial Resource Strain: High Risk (07/28/2021)   Received from [facility], [facility]   Overall Financial Resource Strain (CARDIA)    Difficulty of Paying Living Expenses: Very hard  Food Insecurity: No Food Insecurity (04/11/2023)   Hunger Vital Sign    Worried About Running Out of Food in the Last Year: Never true    Ran Out of Food in the Last Year: Never true  Transportation  Needs: No Transportation Needs (04/11/2023)   PRAPARE - Administrator, Civil Service (Medical): No    Lack of Transportation (Non-Medical): No  Physical Activity: Sufficiently Active (07/28/2021)   Received from [facility], [facility]   Exercise Vital Sign    Days of Exercise per Week: 7 days    Minutes of Exercise per Session: 60 min  Stress: Stress Concern Present (08/01/2021)   Received from [facility], [facility]   [employer] of  Occupational Health - Occupational Stress Questionnaire    Feeling of Stress : Very much  Social Connections: Moderately Integrated (04/11/2023)   Social Connection and Isolation Panel [NHANES]    Frequency of Communication with Friends and Family: More than three times a week    Frequency of Social Gatherings with Friends and Family: Twice a week    Attends Religious Services: 1 to 4 times per year    Active Member of [employer] or Organizations: No    Attends Engineer, Structural: 1 to 4 times per year    Marital Status: Never married     Review of Systems: A 12 point ROS discussed and pertinent positives are indicated in the HPI above.  All other systems are negative.  Review of Systems  Constitutional:  Negative for fatigue and fever.  Respiratory:  Positive for cough and shortness of breath.   Cardiovascular:  Negative for chest pain.  Gastrointestinal:  Negative for abdominal pain, nausea and vomiting.  Musculoskeletal:  Negative for back pain.  Psychiatric/Behavioral:  Negative for confusion.     Vital Signs: BP 121/72   Pulse 83   Temp 98 F (36.7 C)   Resp 16   Ht 4' 10 (1.473 m)   Wt 299 lb 13.2 oz (136 kg)   SpO2 94%   BMI 62.66 kg/m   Physical Exam Vitals and nursing note reviewed.  Constitutional:      General: He is not in acute distress.    Appearance: He is well-developed. He is not ill-appearing.  Cardiovascular:     Rate and  Rhythm: Normal rate and regular rhythm.  Pulmonary:     Effort: Pulmonary effort is normal.  Musculoskeletal:        General: Normal range of motion.  Skin:    General: Skin is warm and dry.     Comments: Port site intact, clean, and dry  Neurological:     General: No focal deficit present.     Mental Status: He is alert and oriented to person, place, and time.  Psychiatric:        Mood and Affect: Mood normal.        Behavior: Behavior normal.        Imaging: ECHOCARDIOGRAM COMPLETE Result Date: 04/10/2023    ECHOCARDIOGRAM REPORT   Patient Name:   Mamoudou ALAA EYERMAN Date of Exam: 04/10/2023 Medical Rec #:  [id number]            Height:       58.0 in Accession #:    [id number]           Weight:       299.8 lb Date of Birth:  07/16/1955            BSA:          2.163 m Patient Age:    21 years             BP:           132/64 mmHg Patient Gender: M                    HR:           83 bpm. 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 Risk Factors:Hypertension.  Sonographer:     Christopher Furnace Referring Phys:  [id number] LORANE POLAND Diagnosing Phys: Evalene Lunger MD  Sonographer Comments: Technically challenging study due to limited acoustic windows, no apical window and no subcostal window. IMPRESSIONS  1. Left ventricular ejection fraction, by estimation, is 55 to 60%. The left ventricle has normal function. The left ventricle has no regional wall motion abnormalities. The left ventricular internal cavity size was mildly dilated. Left ventricular diastolic parameters are indeterminate.  2. Right ventricular systolic function is normal. The right ventricular size is normal.  3. Left atrial size was mildly dilated.  4. The mitral valve is normal in structure. No evidence of mitral valve regurgitation. No evidence of mitral  stenosis.  5. The aortic valve is normal in structure. Aortic valve regurgitation is not visualized. No aortic stenosis is present.  6. There is mild dilatation of the aortic root, measuring 44 mm. There is moderate dilatation of the ascending aorta, measuring 46 mm.  7. The inferior vena cava is normal in size with greater than 50% respiratory variability, suggesting right atrial pressure of 3 mmHg. FINDINGS  Left Ventricle: Left ventricular ejection fraction, by estimation, is 55 to 60%. The left ventricle has normal function. The left ventricle has no regional wall motion abnormalities. The left ventricular internal cavity size was mildly dilated. There is  no left ventricular hypertrophy. Left ventricular diastolic parameters are indeterminate. Right Ventricle: The right ventricular size is normal. No increase in right ventricular wall thickness. Right ventricular systolic function is normal. Left Atrium: Left atrial size was mildly dilated. Right Atrium: Right atrial size was normal in size. Pericardium: There is no evidence of pericardial effusion. Mitral Valve: The mitral valve is normal in structure. There is mild calcification of the mitral valve leaflet(s). No evidence of mitral valve regurgitation. No evidence of mitral valve stenosis. Tricuspid Valve: The tricuspid valve is normal in structure. Tricuspid valve regurgitation is not demonstrated. No evidence of tricuspid stenosis. Aortic Valve: The aortic valve is normal in structure. Aortic valve regurgitation is not visualized. No aortic stenosis is present. Pulmonic Valve: The pulmonic valve was normal in structure. Pulmonic valve regurgitation is not visualized. No evidence of pulmonic stenosis. Aorta: The aortic root is normal in size and structure. There is mild dilatation of the aortic root, measuring 44 mm. There is moderate dilatation of the ascending aorta, measuring 46 mm. Venous: The inferior vena cava is normal in size with greater than 50%  respiratory variability, suggesting right atrial pressure of 3 mmHg. IAS/Shunts: No atrial level shunt detected by color flow Doppler.  LEFT VENTRICLE PLAX 2D LVIDd:         5.70 cm LVIDs:         3.40 cm LV PW:         0.90 cm LV IVS:        1.10 cm LVOT diam:     2.20 cm LVOT Area:     3.80 cm  LEFT ATRIUM         Index LA diam:    4.30 cm 1.99 cm/m   AORTA Ao Root diam: 4.70 cm  SHUNTS Systemic Diam: 2.20 cm Evalene Lunger MD Electronically signed by Evalene Lunger MD Signature Date/Time: 04/10/2023/4:34:27 PM    Final    DG Chest 1 View Result Date: 04/10/2023 CLINICAL DATA:  Dyspnea. EXAM: CHEST  1 VIEW COMPARISON:  04/09/2023 FINDINGS: Single-view of the chest demonstrates very low lung volumes. Again noted is a  right jugular Port-A-Cath and the tip is near the superior cavoatrial junction but poorly characterized. Cardiac silhouette is prominent due to low lung volumes. Again noted are postsurgical changes in the lower cervical spine and cervicothoracic junction. Sclerotic lesions are noted in the left clavicle and compatible with history of prostate cancer. IMPRESSION: 1. Low lung volumes without acute findings. 2. Sclerotic lesions in the left clavicle compatible with history of prostate cancer. Electronically Signed   By: Juliene Balder M.D.   On: 04/10/2023 14:47   CT CHEST ABDOMEN PELVIS WO CONTRAST Result Date: 04/10/2023 CLINICAL DATA:  Sepsis, metastatic prostate cancer, altered mental status, dyspnea. * Tracking Code: BO * EXAM: CT CHEST, ABDOMEN AND PELVIS WITHOUT CONTRAST TECHNIQUE: Multidetector CT imaging of the chest, abdomen and pelvis was performed following the standard protocol without IV contrast. RADIATION DOSE REDUCTION: This exam was performed according to the departmental dose-optimization program which includes automated exposure control, adjustment of the mA and/or kV according to patient size and/or use of iterative reconstruction technique. COMPARISON:  03/26/2023 FINDINGS: CT CHEST  FINDINGS Cardiovascular: No significant coronary artery calcification. Global cardiac size is mildly enlarged. No pericardial effusion. Central pulmonary arteries are mildly enlarged suggesting changes of pulmonary arterial hypertension. Mild atherosclerotic calcification within the thoracic aorta. No aortic aneurysm. Right internal jugular chest port tip seen at the superior cavoatrial junction. Mediastinum/Nodes: No enlarged mediastinal, hilar, or axillary lymph nodes. Thyroid gland, trachea, and esophagus demonstrate no significant findings. Lungs/Pleura: Multiple peripheral rounded masses are seen within the lungs measuring up to 18 x 21 mm with the index lesion within the left upper lobe demonstrating central cavitation and a ground-glass halo suspicious for multiple septic emboli particularly as these appear new within the lung bases since prior examination. A small bilateral pleural effusions are present with bibasilar compressive atelectasis. No pneumothorax. No central obstructing lesion. Musculoskeletal: Numerous sclerotic metastases are seen throughout the visualized axial skeleton. No pathologic fracture identified. CT ABDOMEN PELVIS FINDINGS Hepatobiliary: Cholelithiasis without pericholecystic inflammatory change noted. Liver unremarkable in this noncontrast examination. No intra or extrahepatic biliary ductal dilation. Pancreas: Unremarkable Spleen: Unremarkable Adrenals/Urinary Tract: The adrenal glands are unremarkable. The kidneys are normal in size and position. Multiple root hyperdense renal cysts are seen within the left kidney which were not metabolically active on PET CT examination of 10/12/2022 and further follow-up is not required. The kidneys are otherwise unremarkable. Bladder is unremarkable. Stomach/Bowel: The distal colon and rectum are fluid-filled in keeping with a diarrheal state. The stomach, small bowel, large bowel otherwise unremarkable. Appendix normal. No free intraperitoneal  gas or fluid. Vascular/Lymphatic: Aortic atherosclerosis. No enlarged abdominal or pelvic lymph nodes. Reproductive: Prostate is unremarkable. Other: No abdominal wall hernia or abnormality. No abdominopelvic ascites. 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 No pathologic fracture. IMPRESSION: 1. Multiple peripheral rounded masses within the lungs measuring up to 18 x 21 mm with the index lesion within the left upper lobe demonstrating central cavitation and a ground-glass Halo suspicious for multiple septic emboli, a a particularly as these appear new within the lung bases since prior examination. 2. Small bilateral pleural effusions with bibasilar compressive atelectasis. 3. 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 No pathologic fracture identified. 4. Cholelithiasis. 5. Fluid-filled distal colon and rectum in keeping with a diarrheal state. Aortic Atherosclerosis (ICD10-I70.0). Electronically Signed   By: Dorethia Kimberlee HERO.D.  On: 04/10/2023 00:08   DG Chest Portable 1 View Result Date: 04/09/2023 CLINICAL DATA:  Shortness of breath EXAM: PORTABLE CHEST 1 VIEW COMPARISON:  03/29/2023 FINDINGS: There is a right chest wall power-injectable Port-A-Cath with tip at the cavoatrial junction via a right internal jugular vein approach. Mild cardiomegaly. Mild left basilar opacity, likely atelectasis. IMPRESSION: Mild left basilar opacity, likely atelectasis. Electronically Signed   By: Franky Stanford M.D.   On: 04/09/2023 20:58   DG Abd 1 View Result Date: 04/03/2023 CLINICAL DATA:  Ileus. EXAM: ABDOMEN - 1 VIEW COMPARISON:  March 30, 2023. FINDINGS: Stable probable large bowel dilatation. No definite small bowel dilatation is noted. No abnormal calcifications. IMPRESSION: Stable probable large bowel  dilatation is noted suggesting ileus. Electronically Signed   By: Lynwood Landy Raddle M.D.   On: 04/03/2023 12:54   DG Abd 1 View Result Date: 03/30/2023 CLINICAL DATA:  Abdominal distension EXAM: ABDOMEN - 1 VIEW COMPARISON:  03/28/2023 FINDINGS: Scattered large and small bowel gas is noted. The degree of sigmoid gaseous dilatation is relatively stable. No obstructive changes are seen. No free air is noted. Mild retained fecal material is noted in the right colon. This is stable from the prior exam. IMPRESSION: Stable appearance when compare with the prior exam. Electronically Signed   By: Oneil Devonshire M.D.   On: 03/30/2023 20:04   DG Chest Port 1 View Result Date: 03/29/2023 CLINICAL DATA:  Dyspnea EXAM: PORTABLE CHEST - 1 VIEW COMPARISON:  03/23/2023 FINDINGS: Improved aeration. No focal infiltrate. Stable right IJ port catheter to the distal SVC. Heart size and mediastinal contours are within normal limits. Aortic Atherosclerosis (ICD10-170.0). No effusion. Cervical fixation hardware partially visualized. IMPRESSION: No acute cardiopulmonary disease. Electronically Signed   By: JONETTA Faes M.D.   On: 03/29/2023 14:54   DG Abd 1 View Result Date: 03/28/2023 CLINICAL DATA:  Abdominal distension. EXAM: ABDOMEN - 1 VIEW COMPARISON:  CT abdomen pelvis dated 03/26/2023. FINDINGS: Evaluation is limited due to body habitus. Persistent air distention of the colon may represent colonic ileus. The sigmoid colon measures up to 13 cm in length which appears more distended since the CT. Moderate stool noted throughout the colon proximal to the distended segment as seen on the prior CT. No definite free air. No acute osseous pathology. IMPRESSION: Persistent, and slightly worsened, air distention of the colon may represent colonic ileus. Electronically Signed   By: Vanetta Chou M.D.   On: 03/28/2023 13:53   CT ABDOMEN PELVIS WO CONTRAST Result Date: 03/26/2023 CLINICAL DATA:  Abdominal pain. Prostate cancer with  osseous metastasis. EXAM: CT ABDOMEN AND PELVIS WITHOUT CONTRAST TECHNIQUE: Multidetector CT imaging of the abdomen and pelvis was performed following the standard protocol without IV contrast. RADIATION DOSE REDUCTION: This exam was performed according to the departmental dose-optimization program which includes automated exposure control, adjustment of the mA and/or kV according to patient size and/or use of iterative reconstruction technique. COMPARISON:  CT chest abdomen pelvis dated 09/30/2022. FINDINGS: Evaluation of this exam is limited in the absence of intravenous contrast. Evaluation is also limited due to streak artifact caused by patient's arms and body habitus. Lower chest: The visualized lung bases are clear. The tip of a central venous line seen at the cavoatrial junction. No intra-abdominal free air or free fluid. Hepatobiliary: The liver is unremarkable. No biliary dilatation. Multiple stones in the gallbladder. No pericholecystic fluid or evidence of acute cholecystitis by CT. Pancreas: Unremarkable. No pancreatic ductal dilatation or surrounding inflammatory changes. Spleen:  Normal in size without focal abnormality. Adrenals/Urinary Tract: The adrenal glands are unremarkable. Similar appearance of left renal cyst and dilated upper pole collecting system, poorly evaluated on today's study. No stone. The right kidney is unremarkable. The visualized ureters and urinary bladder appear unremarkable. Stomach/Bowel: There is moderate stool throughout the colon. There is mild gaseous distension of the sigmoid colon. No evidence of obstruction. The appendix is not visualized with certainty. No inflammatory changes identified in the right lower quadrant. Vascular/Lymphatic: Mild aortoiliac atherosclerotic disease. The IVC is unremarkable. No portal venous gas. There is no adenopathy. Reproductive: The prostate and seminal vesicles are grossly unremarkable. No pelvic mass Other: There is mild diffuse  subcutaneous edema. Musculoskeletal: Extensive osseous sclerotic metastasis, relatively similar to prior CT. No acute osseous pathology. IMPRESSION: 1. No acute intra-abdominal or pelvic pathology. 2. Moderate colonic stool burden. No evidence of obstruction. 3. Cholelithiasis. 4. Extensive osseous sclerotic metastasis. 5.  Aortic Atherosclerosis (ICD10-I70.0). Electronically Signed   By: Vanetta Chou M.D.   On: 03/26/2023 17:02   ECHOCARDIOGRAM COMPLETE Result Date: 03/23/2023    ECHOCARDIOGRAM REPORT   Patient Name:   Vidyuth Cosgriff Date of Exam: 03/23/2023 Medical Rec #:  [id number]    Height:       58.0 in Accession #:    [id number]   Weight:       300.0 lb Date of Birth:  01/18/1956    BSA:          2.164 m Patient Age:    67 years     BP:           141/82 mmHg Patient Gender: M            HR:           77 bpm. Exam Location:  [facility] Procedure: 2D Echo, Intracardiac Opacification Agent, Cardiac Doppler and Color            Doppler Indications:     Cardiomegaly I51.7  History:         Patient has no prior history of Echocardiogram examinations.                  Risk Factors:Hypertension.  Sonographer:     Bari Roar Referring Phys:  [id number] ELSPETH PARAS NEWTON Diagnosing Phys: Wilbert Bihari MD IMPRESSIONS  1. Left ventricular ejection fraction, by estimation, is 55 to 60%. The left ventricle has normal function. Left ventricular endocardial border not optimally defined to evaluate regional wall motion. Left ventricular diastolic parameters are indeterminate.  2. Right ventricular systolic function was not well visualized. The right ventricular size is normal.  3. The mitral valve is normal in structure. No evidence of mitral valve regurgitation. No evidence of mitral stenosis.  4. The aortic valve is normal in structure. Aortic valve regurgitation is not visualized. No aortic stenosis is present.  5. The inferior vena cava is normal in size with greater than 50% respiratory variability, suggesting right atrial  pressure of 3 mmHg.  6. Ascending aorta measurements are within normal limits for age when indexed to body surface area. FINDINGS  Left Ventricle: Left ventricular ejection fraction, by estimation, is 55 to 60%. The left ventricle has normal function. Left ventricular endocardial border not optimally defined to evaluate regional wall motion. Definity  contrast agent was given IV to delineate the left ventricular endocardial borders. The left ventricular internal cavity size was normal in size. There is no left ventricular hypertrophy. Left ventricular diastolic parameters are indeterminate. Normal left ventricular filling  pressure. Right Ventricle: The right ventricular size is normal. No increase in right ventricular wall thickness. Right ventricular systolic function was not well visualized. Left Atrium: Left atrial size was normal in size. Right Atrium: Right atrial size was normal in size. Pericardium: There is no evidence of pericardial effusion. Mitral Valve: The mitral valve is normal in structure. No evidence of mitral valve regurgitation. No evidence of mitral valve stenosis. MV peak gradient, 5.2 mmHg. The mean mitral valve gradient is 3.0 mmHg. Tricuspid Valve: The tricuspid valve is normal in structure. Tricuspid valve regurgitation is not demonstrated. No evidence of tricuspid stenosis. Aortic Valve: The aortic valve is normal in structure. Aortic valve regurgitation is not visualized. No aortic stenosis is present. Aortic valve mean gradient measures 5.0 mmHg. Aortic valve peak gradient measures 10.6 mmHg. Aortic valve area, by VTI measures 2.00 cm. Pulmonic Valve: The pulmonic valve was normal in structure. Pulmonic valve regurgitation is not visualized. No evidence of pulmonic stenosis. Aorta: The aortic root is normal in size and structure. Ascending aorta measurements are within normal limits for age when indexed to body surface area. Venous: The inferior vena cava is normal in size with greater  than 50% respiratory variability, suggesting right atrial pressure of 3 mmHg. IAS/Shunts: No atrial level shunt detected by color flow Doppler.  LEFT VENTRICLE PLAX 2D LVIDd:         5.10 cm   Diastology LVIDs:         3.60 cm   LV e' medial:    7.18 cm/s LV PW:         1.00 cm   LV E/e' medial:  17.8 LV IVS:        1.10 cm   LV e' lateral:   13.50 cm/s LVOT diam:     2.20 cm   LV E/e' lateral: 9.5 LV SV:         63 LV SV Index:   29 LVOT Area:     3.80 cm  RIGHT VENTRICLE RV Basal diam:  3.80 cm RV Mid diam:    3.80 cm RV S prime:     16.60 cm/s LEFT ATRIUM           Index LA diam:      4.20 cm 1.94 cm/m LA Vol (A4C): 59.1 ml 27.32 ml/m  AORTIC VALVE                    PULMONIC VALVE AV Area (Vmax):    2.31 cm     PV Vmax:        1.32 m/s AV Area (Vmean):   2.50 cm     PV Peak grad:   7.0 mmHg AV Area (VTI):     2.00 cm     RVOT Peak grad: 5 mmHg AV Vmax:           163.00 cm/s AV Vmean:          98.200 cm/s AV VTI:            0.313 m AV Peak Grad:      10.6 mmHg AV Mean Grad:      5.0 mmHg LVOT Vmax:         99.00 cm/s LVOT Vmean:        64.600 cm/s LVOT VTI:          0.165 m LVOT/AV VTI ratio: 0.53  AORTA Ao Root diam: 3.60 cm Ao Asc diam:  3.80 cm MITRAL VALVE MV  Area (PHT): 4.74 cm     SHUNTS MV Area VTI:   2.31 cm     Systemic VTI:  0.16 m MV Peak grad:  5.2 mmHg     Systemic Diam: 2.20 cm MV Mean grad:  3.0 mmHg MV Vmax:       1.14 m/s MV Vmean:      90.2 cm/s MV Decel Time: 160 msec MV E velocity: 128.00 cm/s MV A velocity: 109.00 cm/s MV E/A ratio:  1.17 MV A Prime:    17.4 cm/s Wilbert Bihari MD Electronically signed by Wilbert Bihari MD Signature Date/Time: 03/23/2023/2:39:36 PM    Final    CT Angio Chest Pulmonary Embolism (PE) W or WO Contrast Result Date: 03/23/2023 CLINICAL DATA:  Recent left leg DVT. History of PE. Patient complains of swelling the arms and legs. EXAM: CT ANGIOGRAPHY CHEST WITH CONTRAST TECHNIQUE: Multidetector CT imaging of the chest was performed using the standard protocol  during bolus administration of intravenous contrast. Multiplanar CT image reconstructions and MIPs were obtained to evaluate the vascular anatomy. RADIATION DOSE REDUCTION: This exam was performed according to the departmental dose-optimization program which includes automated exposure control, adjustment of the mA and/or kV according to patient size and/or use of iterative reconstruction technique. CONTRAST:  80mL OMNIPAQUE  IOHEXOL  350 MG/ML SOLN COMPARISON:  03/06/2023. FINDINGS: Cardiovascular: Central pulmonary arteries are well opacified. There is relative decreased opacification of the segmental and smaller pulmonary emboli mildly limiting the exam. Allowing for this, there is no evidence of a pulmonary embolism. Heart is normal in size. No pericardial effusion. Mild left coronary artery calcifications. 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 No aortic dissection. Minor aortic atherosclerosis. Arch branch vessels are widely patent. Mediastinum/Nodes: No neck base, mediastinal or hilar masses. No enlarged lymph nodes. Trachea and esophagus are unremarkable. Lungs/Pleura: Lung volumes are low. Mild interstitial prominence in the lower lungs. Small area of peripheral reticulation in the peripheral right lower lobe. Mild areas mosaic perfusion suspected to be air trapping. No convincing pneumonia and no pulmonary edema. No pleural effusion or pneumothorax. Upper Abdomen: No acute findings.  Multiple gallstones. Musculoskeletal: No fracture or acute finding. Numerous sclerotic bone lesions consistent with metastatic prostate carcinoma. This is similar to the prior CT. Review of the MIP images confirms the above findings. IMPRESSION: 1. No evidence a pulmonary embolism. 2. No acute findings. 3. 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 Recommend semi-annual imaging followup by CTA or MRA and referral to cardiothoracic surgery if not already obtained. This recommendation follows 2010  ACCF/AHA/AATS/ACR/ASA/SCA/SCAI/SIR/STS/SVM Guidelines for the Diagnosis and Management of Patients With Thoracic Aortic Disease. Circulation. 2010; 121: [id number]. Aortic aneurysm NOS (ICD10-I71.9) 4. Minor aortic atherosclerosis mild left coronary artery calcifications. 5. Stable sclerotic bone lesions consistent with metastatic prostate carcinoma. Aortic Atherosclerosis (ICD10-I70.0). Electronically Signed   By: Alm Parkins M.D.   On: 03/23/2023 09:34   DG Chest 2 View Result Date: 03/23/2023 CLINICAL DATA:  68 year old male with history of dyspnea. Swelling in bilateral legs. EXAM: CHEST - 2 VIEW COMPARISON:  Chest x-ray 03/06/2023. FINDINGS: Right internal jugular single-lumen power porta cath with tip terminating in the distal superior vena cava. Lung volumes are very low and film is under penetrated limiting the diagnostic sensitivity and specificity of the examination. With these limitations in mind, bibasilar opacities likely reflect subsegmental atelectasis. No definite consolidative airspace disease. No definite pleural effusions. No pneumothorax. Cephalization of the pulmonary vasculature, without frank pulmonary edema. Mild cardiomegaly. Upper mediastinal contours are within normal  limits. Orthopedic fixation hardware in the lower cervical spine incidentally noted. IMPRESSION: 1. Low lung volumes with bibasilar subsegmental atelectasis. 2. Mild cardiomegaly with pulmonary venous congestion, but no frank pulmonary edema. Electronically Signed   By: Toribio Aye M.D.   On: 03/23/2023 05:42   US  Venous Img Lower Bilateral (DVT) Result Date: 03/23/2023 CLINICAL DATA:  Left leg swelling, bilateral above knee amputation EXAM: BILATERAL LOWER EXTREMITY VENOUS DOPPLER ULTRASOUND TECHNIQUE: Gray-scale sonography with graded compression, as well as color Doppler and duplex ultrasound were performed to evaluate the lower extremity deep venous systems from the level of the common femoral vein and  including the common femoral, femoral, profunda femoral, popliteal and calf veins including the posterior tibial, peroneal and gastrocnemius veins when visible. The superficial great saphenous vein was also interrogated. Spectral Doppler was utilized to evaluate flow at rest and with distal augmentation maneuvers in the common femoral, femoral and popliteal veins. COMPARISON:  03/07/2023 FINDINGS: RIGHT LOWER EXTREMITY Common Femoral Vein: No evidence of thrombus. Normal compressibility, respiratory phasicity and response to augmentation. Saphenofemoral Junction: No evidence of thrombus. Normal compressibility and flow on color Doppler imaging. Profunda Femoral Vein: No evidence of thrombus. Normal compressibility and flow on color Doppler imaging. Femoral Vein: Limited evaluation due to patient's body habitus. Interval development of occlusive thrombus within the visualized proximal segment. The distal segment is not well visualized. Popliteal Vein: Absent Calf Veins: Absent Superficial Great Saphenous Vein: Not well visualized Venous Reflux:  Not assessed Other Findings:  None. LEFT LOWER EXTREMITY Common Femoral Vein: No evidence of thrombus. Normal compressibility, respiratory phasicity and response to augmentation. Saphenofemoral Junction: No evidence of thrombus. Normal compressibility and flow on color Doppler imaging. Profunda Femoral Vein: Occlusive thrombus within the visualized segment, similar prior examination Femoral Vein: Limited visualization due to the patient's body habitus. Persistent occlusive thrombus within the visualized proximal and the mid segment. Popliteal Vein: Absent Calf Veins: Absent Superficial Great Saphenous Vein: Not well assessed on this examination Venous Reflux:  Not assessed Other Findings:  None. IMPRESSION: 1. Interval development of occlusive thrombus within the visualized proximal segment of the right femoral vein. 2. Persistent occlusive thrombus within the visualized  segment of the left profunda femoral vein and proximal and mid segment of the left femoral vein. Electronically Signed   By: Dorethia Molt M.D.   On: 03/23/2023 04:50    Labs:  CBC: Recent Labs    04/04/23 0520 04/05/23 0451 04/09/23 2025 04/10/23 1051  WBC 2.7* 3.5* 20.0* 16.3*  HGB 11.3* 11.9* 10.9* 10.8*  HCT 34.3* 36.2* 31.4* 31.4*  PLT 241 263 299 229    COAGS: Recent Labs    03/07/23 2334 03/08/23 0815 03/23/23 0636 03/25/23 0432 04/04/23 0520 04/05/23 0451 04/10/23 0127 04/11/23 0603  INR 1.3*  --  1.3*   < > 2.2* 2.2* 2.9* 4.0*  APTT 33 82* 34  --   --   --  55*  --    < > = values in this interval not displayed.    BMP: Recent Labs    04/05/23 0451 04/09/23 2025 04/10/23 1051 04/11/23 0603  NA 140 125* 129* 133*  K 3.0* 3.3* 3.2* 2.8*  CL 102 90* 96* 100  CO2 28 21* 20* 19*  GLUCOSE 117* 117* 171* 167*  BUN 15 57* 63* 61*  CALCIUM 9.5 9.2 9.5 9.8  CREATININE 0.98 3.89* 3.11* 2.10*  GFRNONAA >60 16* 21* 34*    LIVER FUNCTION TESTS: Recent Labs    03/24/23 0518  04/03/23 0517 04/09/23 2025 04/10/23 1051  BILITOT 1.5* 0.8 2.6* 2.1*  AST 25 24 48* 44*  ALT 15 21 22 22   ALKPHOS 82 64 59 67  PROT 5.6* 6.8 7.2 6.9  ALBUMIN  3.2* 3.8 3.8 3.3*    TUMOR MARKERS: No results for input(s): AFPTM, CEA, CA199, CHROMGRNA in the last 8760 hours.  Assessment and Plan: Bacteremia  Patient with HCAP sepsis, bacteremia.   He does have a Port-A-Cath in place since 11/2022.  IR consulted for removal.  Patient has been eating and drinking today.  PA to bedside to discuss procedure options.  He elects to proceed today, if able, without sedation.  INR 4.0, supra therapeutic on coumadin  at home.   Recheck INR. If trending appropriately will proceed with removal today.   Risks and benefits of image guided port-a-catheter placement was discussed with the patient including, but not limited to bleeding, infection, pneumothorax.  All of the patient's  questions were answered, patient is agreeable to proceed. Consent signed and in chart.  Advance Care Plan: The advanced care plan/surrogate decision maker was discussed at the time of visit and documented in the medical record.     Thank you for this interesting consult.  I greatly enjoyed meeting Johne Buckle and look forward to participating in their care.  A copy of this report was sent to the requesting provider on this date.  Electronically Signed: Izyk Marty Sue-Ellen Randalyn Ahmed, PA 04/11/2023, 1:56 PM   I spent a total of 20 Minutes    in face to face in clinical consultation, greater than 50% of which was counseling/coordinating care for bacteremia.

## 2023-04-11 NOTE — ED Notes (Signed)
 Dr Marquette Sites notified of PT INR 4.0. orders to be placed as needed.

## 2023-04-11 NOTE — ED Notes (Signed)
 Patient was suppose to be accepted to [facility] today but canceled his admission due to coming to the ED with the intention to be admitted upstairs. Got consent from patient to talk to Dagoberto from the facility. Updated Dagoberto with information on patient care thus far and plan of care. States she will call back tomorrow to get another update. [facility] phone number is [phone]. Dagoberto from [facility] phone number is [phone].

## 2023-04-11 NOTE — ED Notes (Signed)
Pt placed on bedpan as requested

## 2023-04-11 NOTE — Progress Notes (Signed)
 Interventional Radiology Brief Note:  Contacted lab for INR results.  PT 41.1, INR 4.25.    Notified team that Port removal will not be done today.   Will place NPO p MN and reassess INR tomorrow AM.   Dakoda Bassette, MS RD PA-C 4:27 PM

## 2023-04-11 NOTE — Consult Note (Addendum)
 PHARMACY - ANTICOAGULATION CONSULT NOTE  Pharmacy Consult for Warfarin Indication: hx recurrent VTE  Patient Measurements: Height: 4' 10 (147.3 cm) Weight: (!) 136.2 kg (300 lb 4.8 oz) IBW/kg (Calculated) : 45.4  Labs: Recent Labs    04/09/23 2025 04/09/23 2132 04/10/23 0127 04/10/23 1051  HGB 10.9*  --   --  10.8*  HCT 31.4*  --   --  31.4*  PLT 299  --   --  229  APTT  --   --  55*  --   LABPROT  --   --  30.6*  --   INR  --   --  2.9*  --   CREATININE 3.89*  --   --  3.11*  TROPONINIHS  --  22*  --   --    Estimated Creatinine Clearance: 26.6 mL/min (A) (by C-G formula based on SCr of 3.11 mg/dL (H)).  Medical History: Past Medical History:  Diagnosis Date   Depression    History of blood clots 08/13/2011   History of left knee replacement    HTN (hypertension)    Lymphedema    lt leg-per pt   PE (pulmonary thromboembolism) (HCC)    Pressure injury of skin, unspecified injury stage, unspecified location    S/P AKA (above knee amputation) bilateral (HCC)    rt 11/24/07 and lt 01/11/17   PTA warfarin dosing: Warfarin dosing difficult to determine. Went to speak with patient who could not give a firm regimen. Base dose is 5 mg but phones in INR weekly with clinic. He estimates an average of warfarin 6 mg daily. Last dose was 2/4 morning.  Assessment: 68 y.o. 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 Patient was hospitalized last month for recurrent VTE. Patient was on warfarin at the time of that VTE, but at presentation INR was subtherapeutic at 1.2 due to lack of enoxaparin  bridge, and so warfarin was seen as not a therapeutic failure.  For this admission, pharmacy has been consulted to manage warfarin dosing. Given history of recurrent VTE on anticoagulation, I would target upper end of INR goal  anyways.  New drug-drug interactions: N/A  Goal of Therapy:  INR 2-3 >> would aim for 2.5-3 Monitor platelets by anticoagulation protocol: Yes  Monitoring:  Date INR Assessment Warfarin Dose   2/5 2.9 Therapeutic 6 mg  2/6 4 Supratherapeutic Hold   Plan:  --INR is supratherapeutic --Hold warfarin today --Appears patient may need procedures (port removal / TEE). F/u reversal if indicated. Patient may need bridging peri-operatively --Daily INR per protocol  [name] 04/10/2023 11:30 AM

## 2023-04-11 NOTE — Progress Notes (Signed)
 Pharmacy Antibiotic Note  [name] is a 68 y.o. male admitted on 04/09/2023 with MRSA bacteremia.  Pharmacy has been consulted for Vancomycin  dosing.  Patient with recent admission for recurrent DVT, discharged 1/31.  Comes to ED 2/4 with confusion and shortness of breath.  CT chest/abdomen/pelvis lung findings concerning for septic pulmonary emboli.  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   Today, 04/11/2023 Day #2 vancomycin  Renal: AKI - SCr 2.11 (trending down), on recent admit SCr was < 1 mg/dl [id number] WBC 83.6 on 2/5 2/4 blood culture: 1/4 bottles GPC, BCID detects MRSA 2/5 Urine cx: pending Respiratory panel and respiratory virus panel neg 2/5 C difficile neg Rule out endocarditis with concern for septic pulmonary embolism on CT 2/5 ECHO: no endocarditis  Vancomycin  dosing/levels: Vancomycin  doses given 2/4 1gm IV x 1 at 2356 then 2/5 1500mg  IV  x1 2/6 random vancomycin  level at 0603 = 11 mcg/mL  Plan: Vancomycin  1500mg  IV x 1 today based on random level this morning Note history of infusion related reaction with vancomycin  - slow infusion Check random vancomycin  level in the morning to evaluate clearance as SCr improving Anticipate may be a little difficult to dose vancomycin  with PMH Bilateral AKA - effect on volume of distribution and SCr (loss of muscle mass).   Follow renal function closely Follow current cultures, ECHO, repeat blood cultures ID consult  Temp (24hrs), Avg:98.1 F (36.7 C), Min:97.7 F (36.5 C), Max:98.7 F (37.1 C)   Recent Labs  Lab 04/05/23 0451 04/09/23 2025 04/09/23 2132 04/10/23 0127 04/10/23 0350 04/10/23 1051 04/11/23 0603  WBC 3.5* 20.0*  --   --   --  16.3*  --   CREATININE 0.98 3.89*  --   --   --  3.11* 2.10*  LATICACIDVEN  --   --  1.6 1.5 1.5  --   --   VANCORANDOM  --    --   --   --   --   --  11    Estimated Creatinine Clearance: 39.4 mL/min (A) (by C-G formula based on SCr of 2.1 mg/dL (H)).    Allergies  Allergen Reactions   Bacitracin Hives   Vancomycin  Other (See Comments) and Rash    Red man syndrome.   Ketorolac     Other Reaction(s): Kidney Disorder  Other reaction(s): Kidney Disorder   Spironolactone     Other Reaction(s): Unknown  Other reaction(s): Unknown    Antimicrobials this admission: 2/4 Cefepime  >> x 1 dose 2/4 Vancomycin  >>    Thank you for allowing pharmacy to be a part of this patient's care.  Timmia Cogburn, PharmD, BCPS, BCIDP Work Cell: [phone] 04/11/2023 7:59 AM

## 2023-04-11 NOTE — ED Notes (Signed)
Pt given ice water as requested °

## 2023-04-11 NOTE — ED Notes (Signed)
 Patient changed from loose BM. Chucks placed. Pt repositioned and CB in reach.

## 2023-04-11 NOTE — Progress Notes (Signed)
 Date of Admission:  04/09/2023   T ID: Carlie Corpus is a 68 y.o. male  Principal Problem:   HAP (hospital-acquired pneumonia) Active Problems:   Prostate cancer metastatic to bone [facility])   PE (pulmonary thromboembolism) (HCC)   Essential hypertension   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   Ogilvie syndrome   Severe sepsis (HCC)   AKI (acute kidney injury) (HCC)    Subjective:   Patient is feeling better Breathing better   Medications:   buPROPion   300 mg Oral Daily   furosemide   20 mg Oral BID   gabapentin   600 mg Oral TID   ipratropium-albuterol   3 mL Nebulization Q4H   leptospermum manuka honey  1 Application Topical Daily   lisinopril   10 mg Oral Daily   methylPREDNISolone  (SOLU-MEDROL ) injection  80 mg Intravenous Q0600   polyethylene glycol  17 g Oral BID   vancomycin  variable dose per unstable renal function (pharmacist dosing)   Does not apply See admin instructions   Warfarin - Pharmacist Dosing Inpatient   Does not apply q1600    Objective: Vital signs in last 24 hours: Patient Vitals for the past 24 hrs:  BP Temp Temp src Pulse Resp SpO2  04/11/23 1600 (!) 124/54 -- -- 76 19 97 %  04/11/23 1500 (!) 109/56 -- -- 79 16 95 %  04/11/23 1407 -- (!) 97.5 F (36.4 C) Oral -- -- --  04/11/23 1135 121/72 -- -- 83 16 94 %  04/11/23 0700 117/73 -- -- 73 15 97 %  04/11/23 0600 -- 98 F (36.7 C) -- -- -- --  04/11/23 0400 104/69 -- -- 74 14 95 %  04/11/23 0330 -- 97.7 F (36.5 C) -- -- -- --  04/11/23 0200 96/60 -- -- 75 11 96 %  04/11/23 0023 102/60 -- -- 85 18 93 %  04/10/23 2300 (!) 93/58 -- -- 74 13 94 %  04/10/23 2103 115/83 98 F (36.7 C) Oral 87 (!) 21 94 %  04/10/23 2000 123/75 -- -- 88 20 94 %  04/10/23 1945 -- -- -- 88 20 92 %  04/10/23 1900 110/68 97.9 F (36.6 C) Oral 77 13 94 %  04/10/23 1800 94/63 -- -- 80 17 95 %      PHYSICAL EXAM:  General: Alert, cooperative, no distress, appears stated age.  Head: Normocephalic,  without obvious abnormality, atraumatic. Eyes: Conjunctivae clear, anicteric sclerae. Pupils are equal ENT Nares normal. No drainage or sinus tenderness. Lips, mucosa, and tongue normal. No Thrush Neck: Supple, symmetrical, no adenopathy, thyroid: non tender no carotid bruit and no JVD. Back: No CVA tenderness. Lungs: Bilateral air entry Heart: S1-S2. Abdomen: Soft, non-tender,not distended. Bowel sounds normal. No masses Extremities: Bilateral AKA  skin: No rashes or lesions. Or bruising Lymph: Cervical, supraclavicular normal. Neurologic: Grossly non-focal  Lab Results    Latest Ref Rng & Units 04/10/2023   10:51 AM 04/09/2023    8:25 PM 04/05/2023    4:51 AM  CBC  WBC 4.0 - 10.5 K/uL 16.3  20.0  3.5   Hemoglobin 13.0 - 17.0 g/dL 89.1  89.0  88.0   Hematocrit 39.0 - 52.0 % 31.4  31.4  36.2   Platelets 150 - 400 K/uL 229  299  263        Latest Ref Rng & Units 04/11/2023    6:03 AM 04/10/2023   10:51 AM 04/09/2023    8:25 PM  CMP  Glucose 70 -  99 mg/dL 832  828  882   BUN 8 - 23 mg/dL 61  63  57   Creatinine 0.61 - 1.24 mg/dL 7.89  6.88  6.10   Sodium 135 - 145 mmol/L 133  129  125   Potassium 3.5 - 5.1 mmol/L 2.8  3.2  3.3   Chloride 98 - 111 mmol/L 100  96  90   CO2 22 - 32 mmol/L 19  20  21    Calcium 8.9 - 10.3 mg/dL 9.8  9.5  9.2   Total Protein 6.5 - 8.1 g/dL  6.9  7.2   Total Bilirubin 0.0 - 1.2 mg/dL  2.1  2.6   Alkaline Phos 38 - 126 U/L  67  59   AST 15 - 41 U/L  44  48   ALT 0 - 44 U/L  22  22       Microbiology:  Studies/Results: ECHOCARDIOGRAM COMPLETE Result Date: 04/10/2023    ECHOCARDIOGRAM REPORT   Patient Name:   Harshan DASHAUN ONSTOTT Date of Exam: 04/10/2023 Medical Rec #:  [id number]            Height:       58.0 in Accession #:    [id number]           Weight:       299.8 lb Date of Birth:  1955-07-16            BSA:          2.163 m Patient Age:    64 years             BP:           132/64 mmHg Patient Gender: M                    HR:           83 bpm. Exam  Location:  [facility] Procedure: 2D Echo, Cardiac Doppler and Color Doppler Indications:     Endocarditis I38  History:         Patient has prior history of Echocardiogram examinations, most                  recent 04/23/2023. Risk Factors:Hypertension.  Sonographer:     Christopher Furnace Referring Phys:  [id number] LORANE POLAND Diagnosing Phys: Evalene Lunger MD  Sonographer Comments: Technically challenging study due to limited acoustic windows, no apical window and no subcostal window. IMPRESSIONS  1. Left ventricular ejection fraction, by estimation, is 55 to 60%. The left ventricle has normal function. The left ventricle has no regional wall motion abnormalities. The left ventricular internal cavity size was mildly dilated. Left ventricular diastolic parameters are indeterminate.  2. Right ventricular systolic function is normal. The right ventricular size is normal.  3. Left atrial size was mildly dilated.  4. The mitral valve is normal in structure. No evidence of mitral valve regurgitation. No evidence of mitral stenosis.  5. The aortic valve is normal in structure. Aortic valve regurgitation is not visualized. No aortic stenosis is present.  6. There is mild dilatation of the aortic root, measuring 44 mm. There is moderate dilatation of the ascending aorta, measuring 46 mm.  7. The inferior vena cava is normal in size with greater than 50% respiratory variability, suggesting right atrial pressure of 3 mmHg. FINDINGS  Left Ventricle: Left ventricular ejection fraction, by estimation, is 55 to 60%. The left ventricle has normal function. The left ventricle has no regional  wall motion abnormalities. The left ventricular internal cavity size was mildly dilated. There is  no left ventricular hypertrophy. Left ventricular diastolic parameters are indeterminate. Right Ventricle: The right ventricular size is normal. No increase in right ventricular wall thickness. Right ventricular systolic function is normal. Left Atrium: Left  atrial size was mildly dilated. Right Atrium: Right atrial size was normal in size. Pericardium: There is no evidence of pericardial effusion. Mitral Valve: The mitral valve is normal in structure. There is mild calcification of the mitral valve leaflet(s). No evidence of mitral valve regurgitation. No evidence of mitral valve stenosis. Tricuspid Valve: The tricuspid valve is normal in structure. Tricuspid valve regurgitation is not demonstrated. No evidence of tricuspid stenosis. Aortic Valve: The aortic valve is normal in structure. Aortic valve regurgitation is not visualized. No aortic stenosis is present. Pulmonic Valve: The pulmonic valve was normal in structure. Pulmonic valve regurgitation is not visualized. No evidence of pulmonic stenosis. Aorta: The aortic root is normal in size and structure. There is mild dilatation of the aortic root, measuring 44 mm. There is moderate dilatation of the ascending aorta, measuring 46 mm. Venous: The inferior vena cava is normal in size with greater than 50% respiratory variability, suggesting right atrial pressure of 3 mmHg. IAS/Shunts: No atrial level shunt detected by color flow Doppler.  LEFT VENTRICLE PLAX 2D LVIDd:         5.70 cm LVIDs:         3.40 cm LV PW:         0.90 cm LV IVS:        1.10 cm LVOT diam:     2.20 cm LVOT Area:     3.80 cm  LEFT ATRIUM         Index LA diam:    4.30 cm 1.99 cm/m   AORTA Ao Root diam: 4.70 cm  SHUNTS Systemic Diam: 2.20 cm Evalene Lunger MD Electronically signed by Evalene Lunger MD Signature Date/Time: 04/10/2023/4:34:27 PM    Final    DG Chest 1 View Result Date: 04/10/2023 CLINICAL DATA:  Dyspnea. EXAM: CHEST  1 VIEW COMPARISON:  04/09/2023 FINDINGS: Single-view of the chest demonstrates very low lung volumes. Again noted is a right jugular Port-A-Cath and the tip is near the superior cavoatrial junction but poorly characterized. Cardiac silhouette is prominent due to low lung volumes. Again noted are postsurgical changes  in the lower cervical spine and cervicothoracic junction. Sclerotic lesions are noted in the left clavicle and compatible with history of prostate cancer. IMPRESSION: 1. Low lung volumes without acute findings. 2. Sclerotic lesions in the left clavicle compatible with history of prostate cancer. Electronically Signed   By: Juliene Balder M.D.   On: 04/10/2023 14:47   CT CHEST ABDOMEN PELVIS WO CONTRAST Result Date: 04/10/2023 CLINICAL DATA:  Sepsis, metastatic prostate cancer, altered mental status, dyspnea. * Tracking Code: BO * EXAM: CT CHEST, ABDOMEN AND PELVIS WITHOUT CONTRAST TECHNIQUE: Multidetector CT imaging of the chest, abdomen and pelvis was performed following the standard protocol without IV contrast. RADIATION DOSE REDUCTION: This exam was performed according to the departmental dose-optimization program which includes automated exposure control, adjustment of the mA and/or kV according to patient size and/or use of iterative reconstruction technique. COMPARISON:  03/26/2023 FINDINGS: CT CHEST FINDINGS Cardiovascular: No significant coronary artery calcification. Global cardiac size is mildly enlarged. No pericardial effusion. Central pulmonary arteries are mildly enlarged suggesting changes of pulmonary arterial hypertension. Mild atherosclerotic calcification within the thoracic aorta. No aortic aneurysm.  Right internal jugular chest port tip seen at the superior cavoatrial junction. Mediastinum/Nodes: No enlarged mediastinal, hilar, or axillary lymph nodes. Thyroid gland, trachea, and esophagus demonstrate no significant findings. Lungs/Pleura: Multiple peripheral rounded masses are seen within the lungs measuring up to 18 x 21 mm with the index lesion within the left upper lobe demonstrating central cavitation and a ground-glass halo suspicious for multiple septic emboli particularly as these appear new within the lung bases since prior examination. A small bilateral pleural effusions are present  with bibasilar compressive atelectasis. No pneumothorax. No central obstructing lesion. Musculoskeletal: Numerous sclerotic metastases are seen throughout the visualized axial skeleton. No pathologic fracture identified. CT ABDOMEN PELVIS FINDINGS Hepatobiliary: Cholelithiasis without pericholecystic inflammatory change noted. Liver unremarkable in this noncontrast examination. No intra or extrahepatic biliary ductal dilation. Pancreas: Unremarkable Spleen: Unremarkable Adrenals/Urinary Tract: The adrenal glands are unremarkable. The kidneys are normal in size and position. Multiple root hyperdense renal cysts are seen within the left kidney which were not metabolically active on PET CT examination of 10/12/2022 and further follow-up is not required. The kidneys are otherwise unremarkable. Bladder is unremarkable. Stomach/Bowel: The distal colon and rectum are fluid-filled in keeping with a diarrheal state. The stomach, small bowel, large bowel otherwise unremarkable. Appendix normal. No free intraperitoneal gas or fluid. Vascular/Lymphatic: Aortic atherosclerosis. No enlarged abdominal or pelvic lymph nodes. Reproductive: Prostate is unremarkable. Other: No abdominal wall hernia or abnormality. No abdominopelvic ascites. 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 No pathologic fracture. IMPRESSION: 1. Multiple peripheral rounded masses within the lungs measuring up to 18 x 21 mm with the index lesion within the left upper lobe demonstrating central cavitation and a ground-glass Halo suspicious for multiple septic emboli, a a particularly as these appear new within the lung bases since prior examination. 2. Small bilateral pleural effusions with bibasilar compressive atelectasis. 3. Extensive sclerotic metastases throughout the axial skeleton in keeping with the patient's known history of metastatic  prostate cancer. No pathologic fracture identified. 4. Cholelithiasis. 5. Fluid-filled distal colon and rectum in keeping with a diarrheal state. Aortic Atherosclerosis (ICD10-I70.0). Electronically Signed   By: Dorethia Molt M.D.   On: 04/10/2023 00:08   DG Chest Portable 1 View Result Date: 04/09/2023 CLINICAL DATA:  Shortness of breath EXAM: PORTABLE CHEST 1 VIEW COMPARISON:  03/29/2023 FINDINGS: There is a right chest wall power-injectable Port-A-Cath with tip at the cavoatrial junction via a right internal jugular vein approach. Mild cardiomegaly. Mild left basilar opacity, likely atelectasis. IMPRESSION: Mild left basilar opacity, likely atelectasis. Electronically Signed   By: Franky Stanford M.D.   On: 04/09/2023 20:58     Assessment/Plan:  MRSA bacteremia With port in place and multiple nodules in the lungs concern for septic emboli and endocarditis of tricuspid valve 2 d echo TEE once resp status is stable On vancomycin  adjusted to crcl Daptomycin is not an option because of  lung infiltrate PORT will have to be removed but because INR is high awaiting stabilization of that. 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

## 2023-04-11 NOTE — ED Notes (Signed)
 EDT into check on pt while this RN with critical pt

## 2023-04-12 ENCOUNTER — Inpatient Hospital Stay: Payer: Medicare PPO

## 2023-04-12 ENCOUNTER — Other Ambulatory Visit: Payer: Self-pay

## 2023-04-12 DIAGNOSIS — A4902 Methicillin resistant Staphylococcus aureus infection, unspecified site: Secondary | ICD-10-CM | POA: Diagnosis not present

## 2023-04-12 DIAGNOSIS — R918 Other nonspecific abnormal finding of lung field: Secondary | ICD-10-CM | POA: Diagnosis not present

## 2023-04-12 DIAGNOSIS — J189 Pneumonia, unspecified organism: Secondary | ICD-10-CM | POA: Diagnosis not present

## 2023-04-12 DIAGNOSIS — R7881 Bacteremia: Secondary | ICD-10-CM | POA: Diagnosis not present

## 2023-04-12 DIAGNOSIS — Z515 Encounter for palliative care: Secondary | ICD-10-CM

## 2023-04-12 DIAGNOSIS — Y95 Nosocomial condition: Secondary | ICD-10-CM | POA: Diagnosis not present

## 2023-04-12 DIAGNOSIS — B9562 Methicillin resistant Staphylococcus aureus infection as the cause of diseases classified elsewhere: Secondary | ICD-10-CM | POA: Diagnosis not present

## 2023-04-12 LAB — MAGNESIUM: Magnesium: 1.9 mg/dL (ref 1.7–2.4)

## 2023-04-12 LAB — CBC WITH DIFFERENTIAL/PLATELET
Abs Immature Granulocytes: 0.09 10*3/uL — ABNORMAL HIGH (ref 0.00–0.07)
Basophils Absolute: 0 10*3/uL (ref 0.0–0.1)
Basophils Relative: 0 %
Eosinophils Absolute: 0 10*3/uL (ref 0.0–0.5)
Eosinophils Relative: 0 %
HCT: 31.7 % — ABNORMAL LOW (ref 39.0–52.0)
Hemoglobin: 10.6 g/dL — ABNORMAL LOW (ref 13.0–17.0)
Immature Granulocytes: 1 %
Lymphocytes Relative: 2 %
Lymphs Abs: 0.3 10*3/uL — ABNORMAL LOW (ref 0.7–4.0)
MCH: 30.9 pg (ref 26.0–34.0)
MCHC: 33.4 g/dL (ref 30.0–36.0)
MCV: 92.4 fL (ref 80.0–100.0)
Monocytes Absolute: 0.7 10*3/uL (ref 0.1–1.0)
Monocytes Relative: 6 %
Neutro Abs: 11.4 10*3/uL — ABNORMAL HIGH (ref 1.7–7.7)
Neutrophils Relative %: 91 %
Platelets: 274 10*3/uL (ref 150–400)
RBC: 3.43 MIL/uL — ABNORMAL LOW (ref 4.22–5.81)
RDW: 14.3 % (ref 11.5–15.5)
WBC: 12.4 10*3/uL — ABNORMAL HIGH (ref 4.0–10.5)
nRBC: 0 % (ref 0.0–0.2)

## 2023-04-12 LAB — COMPREHENSIVE METABOLIC PANEL
ALT: 25 U/L (ref 0–44)
AST: 25 U/L (ref 15–41)
Albumin: 3.1 g/dL — ABNORMAL LOW (ref 3.5–5.0)
Alkaline Phosphatase: 58 U/L (ref 38–126)
Anion gap: 10 (ref 5–15)
BUN: 54 mg/dL — ABNORMAL HIGH (ref 8–23)
CO2: 22 mmol/L (ref 22–32)
Calcium: 10.2 mg/dL (ref 8.9–10.3)
Chloride: 104 mmol/L (ref 98–111)
Creatinine, Ser: 1.29 mg/dL — ABNORMAL HIGH (ref 0.61–1.24)
GFR, Estimated: >60 mL/min (ref >60)
Glucose, Bld: 139 mg/dL — ABNORMAL HIGH (ref 70–99)
Potassium: 2.8 mmol/L — ABNORMAL LOW (ref 3.5–5.1)
Sodium: 136 mmol/L (ref 135–145)
Total Bilirubin: 0.6 mg/dL (ref 0.0–1.2)
Total Protein: 6.8 g/dL (ref 6.5–8.1)

## 2023-04-12 LAB — CULTURE, BLOOD (ROUTINE X 2)

## 2023-04-12 LAB — LEGIONELLA PNEUMOPHILA SEROGP 1 UR AG: L. pneumophila Serogp 1 Ur Ag: NEGATIVE

## 2023-04-12 LAB — CREATININE, SERUM
Creatinine, Ser: 1.35 mg/dL — ABNORMAL HIGH (ref 0.61–1.24)
GFR, Estimated: 58 mL/min — ABNORMAL LOW (ref >60)

## 2023-04-12 LAB — PROTIME-INR
INR: 4.5 (ref 0.8–1.2)
Prothrombin Time: 43.1 s — ABNORMAL HIGH (ref 11.4–15.2)

## 2023-04-12 LAB — PHOSPHORUS: Phosphorus: 2.1 mg/dL — ABNORMAL LOW (ref 2.5–4.6)

## 2023-04-12 LAB — VANCOMYCIN, RANDOM: Vancomycin Rm: 16 ug/mL

## 2023-04-12 MED ORDER — LIDOCAINE 5 % EX PTCH
1.0000 | MEDICATED_PATCH | CUTANEOUS | Status: DC
  Administered 2023-04-12 – 2023-04-25 (×13): 1 via TRANSDERMAL
  Filled 2023-04-12 (×18): qty 1

## 2023-04-12 MED ORDER — HYDROMORPHONE HCL 1 MG/ML IJ SOLN
1.0000 mg | Freq: Once | INTRAMUSCULAR | Status: AC
  Administered 2023-04-12: 1 mg via INTRAVENOUS

## 2023-04-12 MED ORDER — ACETAMINOPHEN 325 MG PO TABS
650.0000 mg | ORAL_TABLET | Freq: Four times a day (QID) | ORAL | Status: DC
  Administered 2023-04-12 – 2023-04-21 (×31): 650 mg via ORAL
  Administered 2023-04-22: 325 mg via ORAL
  Administered 2023-04-22 – 2023-04-23 (×4): 650 mg via ORAL
  Administered 2023-04-23: 325 mg via ORAL
  Administered 2023-04-23 – 2023-04-24 (×2): 650 mg via ORAL
  Filled 2023-04-12 (×40): qty 2

## 2023-04-12 MED ORDER — OXYCODONE-ACETAMINOPHEN 5-325 MG PO TABS
1.0000 | ORAL_TABLET | Freq: Four times a day (QID) | ORAL | Status: DC | PRN
  Administered 2023-04-12 – 2023-04-23 (×12): 1 via ORAL
  Filled 2023-04-12 (×13): qty 1

## 2023-04-12 MED ORDER — VANCOMYCIN HCL 1500 MG/300ML IV SOLN
1500.0000 mg | INTRAVENOUS | Status: DC
  Administered 2023-04-12 – 2023-04-13 (×2): 1500 mg via INTRAVENOUS
  Filled 2023-04-12 (×2): qty 300

## 2023-04-12 MED ORDER — VITAMIN K1 10 MG/ML IJ SOLN
1.0000 mg | Freq: Once | INTRAVENOUS | Status: AC
  Administered 2023-04-12: 1 mg via INTRAVENOUS
  Filled 2023-04-12 (×2): qty 0.1

## 2023-04-12 MED ORDER — PREDNISONE 50 MG PO TABS
50.0000 mg | ORAL_TABLET | Freq: Every day | ORAL | Status: DC
  Administered 2023-04-13 – 2023-04-14 (×2): 50 mg via ORAL
  Filled 2023-04-12 (×2): qty 1

## 2023-04-12 MED ORDER — HYDROMORPHONE HCL 1 MG/ML IJ SOLN
0.5000 mg | INTRAMUSCULAR | Status: DC | PRN
  Administered 2023-04-12 – 2023-04-24 (×14): 0.5 mg via INTRAVENOUS
  Filled 2023-04-12 (×17): qty 0.5

## 2023-04-12 MED ORDER — NYSTATIN 100000 UNIT/GM EX CREA
TOPICAL_CREAM | Freq: Two times a day (BID) | CUTANEOUS | Status: DC
  Filled 2023-04-12 (×4): qty 30

## 2023-04-12 MED ORDER — POTASSIUM CHLORIDE CRYS ER 20 MEQ PO TBCR
40.0000 meq | EXTENDED_RELEASE_TABLET | ORAL | Status: AC
Start: 2023-04-12 — End: 2023-04-12
  Administered 2023-04-12 (×2): 40 meq via ORAL
  Filled 2023-04-12 (×2): qty 2

## 2023-04-12 NOTE — ED Notes (Signed)
 This RN provided beverage per pt request

## 2023-04-12 NOTE — Progress Notes (Addendum)
 Pharmacy Antibiotic Note  [name] is a 68 y.o. male admitted on 04/09/2023 with MRSA bacteremia.  Pharmacy has been consulted for Vancomycin  dosing.  Patient with recent admission for recurrent DVT, discharged 1/31.  Comes to ED 2/4 with confusion and shortness of breath.  CT chest/abdomen/pelvis lung findings concerning for septic pulmonary emboli.  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   Today, 04/12/2023 Day #3 vancomycin  Renal: AKI - SCr 1.35 (improving), on recent admit SCr was < 1 mg/dl [id number] WBC 87.5 - improving 2/4 blood culture: 1/4 bottles GPC, BCID detects MRSA 2/5 Urine cx: pending Respiratory panel and respiratory virus panel neg 2/5 C difficile neg Rule out endocarditis with concern for septic pulmonary embolism on CT 2/5 ECHO: no endocarditis 2/7 repeat blood cultures:   Vancomycin  dosing/levels: Vancomycin  doses given 2/4 1gm IV x 1 at 2356 then 2/5 1500mg  IV  x1 2/6 random vancomycin  level at 0603 = 11 mcg/mL 2/6 vancomycin  1500mg  IV x 1 given at 0947, random vancomycin  level 2/7 at 0553 = 16 mcg/mL  Plan: Start Vancomycin  1500mg  IV q24h (infuse over ) based on level this morning,  SCr is improving Note history of infusion related reaction with vancomycin  - slow infusion Follow renal function and suggest checking a trough on 2/9 Anticipate  some difficulty dosing vancomycin  with PMH Bilateral AKA - effect on volume of distribution and SCr (loss of muscle mass).   Follow renal function closely daily CMP ordered until 2/12 Follow current cultures, ECHO, repeat blood cultures Check Daptomycin MIC - not currently option with septic PE Await PORT removal (awaiting INR to come down) ID consult  Temp (24hrs), Avg:98 F (36.7 C), Min:97.5 F (36.4 C), Max:98.7 F (37.1 C)   Recent Labs  Lab  04/09/23 2025 04/09/23 2132 04/10/23 0127 04/10/23 0350 04/10/23 1051 04/11/23 0603 04/12/23 0550 04/12/23 0553  WBC 20.0*  --   --   --  16.3*  --   --   --   CREATININE 3.89*  --   --   --  3.11* 2.10* 1.35*  --   LATICACIDVEN  --  1.6 1.5 1.5  --   --   --   --   VANCORANDOM  --   --   --   --   --  11  --  16    Estimated Creatinine Clearance: 61.3 mL/min (A) (by C-G formula based on SCr of 1.35 mg/dL (H)).    Allergies  Allergen Reactions   Bacitracin Hives   Vancomycin  Other (See Comments) and Rash    Red man syndrome.   Ketorolac     Other Reaction(s): Kidney Disorder  Other reaction(s): Kidney Disorder   Spironolactone     Other Reaction(s): Unknown  Other reaction(s): Unknown    Antimicrobials this admission: 2/4 Cefepime  >> x 1 dose 2/4 Vancomycin  >>    Thank you for allowing pharmacy to be a part of this patient's care.  Allie Gerhold, PharmD, BCPS, BCIDP Work Cell: [phone] 04/12/2023 8:05 AM

## 2023-04-12 NOTE — Progress Notes (Signed)
 PROGRESS NOTE    [name]  FMW:[id number] DOB: May 25, 1955 DOA: 04/09/2023 PCP: Eliverto Bette Hover, MD  Chief Complaint  Patient presents with   Shortness of Breath    Hospital Course:  [name] is 68 y.o. male with stage IV prostate cancer, recurrent VTE, hypertension, PVD status post AKA, decubitus ulcers, who was admitted on 1/18 with shortness of breath and was found to have recurrent DVT despite Eliquis  and then Coumadin  (not technically a failure as he was not bridged.)  He had been unable to afford Lovenox  as an outpatient.  During hospitalization he was noted to have volume overload and was diuresed.  Stay was also complicated by ileus for stool impaction that was treated aggressively with a bowel regimen.  He was discharged in stable condition on 1/31.  He returned to the ED on 2/4 due to increasing confusion and shortness of breath.  He was found to be saturating 89% on room air.  EMS placed patient on CPAP prior to arrival.  In the ED he was found to be septic with fever of 101.9, respiratory rate 31, leukocytosis 20K, and hypoxia.  Chest x-ray revealed left basilar opacity.  He was placed on BiPAP due to work of breathing and hypoxemia.  He failed weaning trial due to work of breathing though he did have good oxygen saturations.  He was admitted for management of healthcare associated pneumonia and sepsis.  Subjective: No acute events overnight. On evaluation today patient continues to complain of pain with deep inspiration.  He also reports that he has been considering his CODE STATUS and he would like to be DNR/DNI.  We also discussed hospice given that patient was on hospice outpatient and he is very clear that he is not interested in hospice services.  He misunderstood hospice's role and thought that it was a home health agency.  He would like to continue all aggressive treatments as we have currently been.   Objective: Vitals:   04/12/23 0200 04/12/23 0241  04/12/23 0600 04/12/23 0637  BP: 127/73  113/66   Pulse: 70  72   Resp: 16  16   Temp:  98.1 F (36.7 C)  98.3 F (36.8 C)  TempSrc:  Oral  Oral  SpO2: 96%  100%   Weight:      Height:        Intake/Output Summary (Last 24 hours) at 04/12/2023 0727 Last data filed at 04/11/2023 1730 Gross per 24 hour  Intake --  Output 1350 ml  Net -1350 ml   Filed Weights   04/09/23 2019  Weight: 136 kg    Examination: General exam: Appears calm, some distress Respiratory system: Shortened expiratory phase, end exp. Wheezing, shallow respirations Cardiovascular system: S1 & S2 heard, RRR.  Gastrointestinal system: Abdomen is less distended, right side flank tender to palpation Neuro: Alert and oriented. No focal neurological deficits. Extremities: Bilateral AKA  Skin: No rashes, lesions Psychiatry: Demonstrates appropriate judgement and insight. Mood & affect appropriate for situation.   Assessment & Plan:  Principal Problem:   HAP (hospital-acquired pneumonia) Active Problems:   Severe sepsis (HCC)   PE (pulmonary thromboembolism) (HCC)   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   Ogilvie syndrome   AKI (acute kidney injury) (HCC)   Prostate cancer metastatic to bone [facility])   Essential hypertension   MRSA bacteremia   Pulmonary infiltrates   Septic embolism (HCC)    Severe sepsis - Criteria met with hypoxemia, fever, AKI,  leukocytosis, tachypnea.  Source: Pneumonia and bacteremia - Antibiotics as below  MRSA bacteremia - Patient is on vancomycin  - ID consulted - He does have a port in place and there are multiple nodules in the lungs concerning for septic emboli - Echocardiogram without evidence of endocarditis, needs a TEE but respiratory status is unlikely to tolerate that at this time. - For now we will remove the port and send tip for culture (IR consulted) - Port removal has been complicated by supratherapeutic INR.  Status post low-dose vitamin K  today.   Repeat INR in a.m.  Healthcare associated pneumonia +/- septic emboli - Left lower lobe opacity on CXR - Given recent discharge from hospital 4 days prior we will treat for healthcare associated pneumonia. - Continue to wean oxygen as tolerated, is intermittently requiring BiPAP due to work of breathing but maintaining O2 sats. -Still with wheezing on exam, continue prednisone  - Procalcitonin: 5.8  Pain - Suspect this is secondary to his multiple osseous lesions.  Continue with as needed Dilaudid  and Percocet.  Scheduled Tylenol  and lidocaine   AKI - Baseline creatinine 0.98, creatinine 3.89 on arrival - Improving some, continue to trend - Continue with gentle NS MIVF  History of Ogilivie syndrome - Currently having bowel movements, continue bowel regimen  Pressure ulcers of skin - Last noted on ischial tuberosity and left buttock - Continue with Medihoney to ulcers - Wound nurse consult  Pulmonary embolism Supratherapeutic INR - Has had recurrent VTEs on Eliquis , now on warfarin therapy INR has been supratherapeutic for the last 2 days over 4, we have held warfarin for 2 days with minimal improvement, INR is increasing.  Received low-dose vitamin K  today.  Avoid high-dose vitamin K  as he is very high risk for coagulopathy given his sepsis, malignancy, and history of recurrent VTE -- Pharmacy to manage Warfarin dosing - Continue warfarin with pharmacy consult for dosing  Hypertension - Continue home meds  Prostate cancer metastatic to bone - Follows with oncology outpatient, currently holding treatment in setting of sepsis - Port removal as above - Palliative consult - Patient was on hospice outpatient but he misunderstood the services.  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  Currently necessitates IV antibiotics. Further work up. Will obtain PT/OT consults when medically appropriate and then discussed discharge planning with TOC at that time.    Consultants:  ID  Procedures:    Antimicrobials:  Anti-infectives (From admission, onward)    Start     Dose/Rate Route Frequency Ordered Stop   04/11/23 1000  vancomycin  (VANCOREADY) IVPB 1500 mg/300 mL        1,500 mg 75 mL/hr over 240 Minutes Intravenous  Once 04/11/23 0828 04/11/23 1358   04/10/23 0308  vancomycin  variable dose per unstable renal function (pharmacist dosing)         Does not apply See admin instructions 04/10/23 0308     04/10/23 0145  vancomycin  (VANCOREADY) IVPB 1500 mg/300 mL        1,500 mg 100 mL/hr over 180 Minutes Intravenous  Once 04/10/23 0131 04/10/23 0546   04/10/23 0130  vancomycin  (VANCOCIN ) IVPB 1000 mg/200 mL premix  Status:  Discontinued        1,000 mg 200 mL/hr over  60 Minutes Intravenous  Once 04/10/23 0120 04/10/23 0131   04/09/23 2100  vancomycin  (VANCOCIN ) IVPB 1000 mg/200 mL premix        1,000 mg 200 mL/hr over 60 Minutes Intravenous  Once 04/09/23 2047 04/10/23 0113   04/09/23 2100  ceFEPIme  (MAXIPIME ) 2 g in sodium chloride  0.9 % 100 mL IVPB        2 g 200 mL/hr over 30 Minutes Intravenous  Once 04/09/23 2047 04/10/23 0113       Data Reviewed: I have personally reviewed following labs and imaging studies CBC: Recent Labs  Lab 04/09/23 2025 04/10/23 1051  WBC 20.0* 16.3*  NEUTROABS 18.2* 15.7*  HGB 10.9* 10.8*  HCT 31.4* 31.4*  MCV 92.1 91.3  PLT 299 229   Basic Metabolic Panel: Recent Labs  Lab 04/09/23 2025 04/10/23 1051 04/11/23 0603 04/12/23 0550  NA 125* 129* 133*  --   K 3.3* 3.2* 2.8*  --   CL 90* 96* 100  --   CO2 21* 20* 19*  --   GLUCOSE 117* 171* 167*  --   BUN 57* 63* 61*  --   CREATININE 3.89* 3.11* 2.10* 1.35*  CALCIUM 9.2 9.5 9.8  --    GFR: Estimated Creatinine  Clearance: 61.3 mL/min (A) (by C-G formula based on SCr of 1.35 mg/dL (H)). Liver Function Tests: Recent Labs  Lab 04/09/23 2025 04/10/23 1051  AST 48* 44*  ALT 22 22  ALKPHOS 59 67  BILITOT 2.6* 2.1*  PROT 7.2 6.9  ALBUMIN  3.8 3.3*   CBG: No results for input(s): GLUCAP in the last 168 hours.   Recent Results (from the past 240 hours)  Resp panel by RT-PCR (RSV, Flu A&B, Covid) Anterior Nasal Swab     Status: None   Collection Time: 04/09/23  8:19 PM   Specimen: Anterior Nasal Swab  Result Value Ref Range Status   SARS Coronavirus 2 by RT PCR NEGATIVE NEGATIVE Final    Comment: (NOTE) SARS-CoV-2 target nucleic acids are NOT DETECTED.  The SARS-CoV-2 RNA is generally detectable in upper respiratory specimens during the acute phase of infection. The lowest concentration of SARS-CoV-2 viral copies this assay can detect is 138 copies/mL. A negative result does not preclude SARS-Cov-2 infection and should not be used as the sole basis for treatment or other patient management decisions. A negative result may occur with  improper specimen collection/handling, submission of specimen other than nasopharyngeal swab, presence of viral mutation(s) within the areas targeted by this assay, and inadequate number of viral copies(<138 copies/mL). A negative result must be combined with clinical observations, patient history, and epidemiological information. The expected result is Negative.  Fact Sheet for Patients:  bloggercourse.com  Fact Sheet for Healthcare Providers:  seriousbroker.it  This test is no t yet approved or cleared by the [place]  FDA and  has been authorized for detection and/or diagnosis of SARS-CoV-2 by FDA under an Emergency Use Authorization (EUA). This EUA will remain  in effect (meaning this test can be used) for the duration of the COVID-19 declaration under Section 564(b)(1) of the Act,  21 U.S.C.section 360bbb-3(b)(1), unless the authorization is terminated  or revoked sooner.       Influenza A by PCR NEGATIVE NEGATIVE Final   Influenza B by PCR NEGATIVE NEGATIVE Final    Comment: (NOTE) The Xpert Xpress SARS-CoV-2/FLU/RSV plus assay is intended as an aid in the diagnosis of influenza from Nasopharyngeal swab specimens and should not be used as a sole basis  for treatment. Nasal washings and aspirates are unacceptable for Xpert Xpress SARS-CoV-2/FLU/RSV testing.  Fact Sheet for Patients: bloggercourse.com  Fact Sheet for Healthcare Providers: seriousbroker.it  This test is not yet approved or cleared by the [place]  FDA and has been authorized for detection and/or diagnosis of SARS-CoV-2 by FDA under an Emergency Use Authorization (EUA). This EUA will remain in effect (meaning this test can be used) for the duration of the COVID-19 declaration under Section 564(b)(1) of the Act, 21 U.S.C. section 360bbb-3(b)(1), unless the authorization is terminated or revoked.     Resp Syncytial Virus by PCR NEGATIVE NEGATIVE Final    Comment: (NOTE) Fact Sheet for Patients: bloggercourse.com  Fact Sheet for Healthcare Providers: seriousbroker.it  This test is not yet approved or cleared by the [place]  FDA and has been authorized for detection and/or diagnosis of SARS-CoV-2 by FDA under an Emergency Use Authorization (EUA). This EUA will remain in effect (meaning this test can be used) for the duration of the COVID-19 declaration under Section 564(b)(1) of the Act, 21 U.S.C. section 360bbb-3(b)(1), unless the authorization is terminated or revoked.  Performed at [facility], [address] [place], [place] [address]   Blood Culture (routine x 2)     Status: Abnormal (Preliminary result)   Collection Time: 04/09/23  8:19 PM   Specimen: Right  Antecubital; Blood  Result Value Ref Range Status   Specimen Description   Final    RIGHT ANTECUBITAL BLOOD Performed at [facility], [address], [place], [place] [address]    Special Requests   Final    BOTTLES DRAWN AEROBIC AND ANAEROBIC Blood Culture results may not be optimal due to an inadequate volume of blood received in culture bottles Performed at [facility], [address] [place], [place] [address]    Culture  Setup Time   Final    GRAM POSITIVE COCCI IN BOTH AEROBIC AND ANAEROBIC BOTTLES CRITICAL RESULT CALLED TO, READ BACK BY AND VERIFIED WITH: MOSE BLEW PHARMD 1137 04/10/23 HNM GRAM STAIN REVIEWED-AGREE WITH RESULT DRT Performed at [facility], [address], [place], [place] [address]    Culture METHICILLIN RESISTANT STAPHYLOCOCCUS AUREUS (A)  Final   Report Status PENDING  Incomplete   Organism ID, Bacteria METHICILLIN RESISTANT STAPHYLOCOCCUS AUREUS  Final      Susceptibility   Methicillin resistant staphylococcus aureus - MIC*    CIPROFLOXACIN >=8 RESISTANT Resistant     ERYTHROMYCIN >=8 RESISTANT Resistant     GENTAMICIN <=0.5 SENSITIVE Sensitive     OXACILLIN >=4 RESISTANT Resistant     TETRACYCLINE <=1 SENSITIVE Sensitive     VANCOMYCIN  1 SENSITIVE Sensitive     TRIMETH /SULFA  <=10 SENSITIVE Sensitive     CLINDAMYCIN <=0.25 SENSITIVE Sensitive     RIFAMPIN <=0.5 SENSITIVE Sensitive     Inducible Clindamycin NEGATIVE Sensitive     LINEZOLID  2 SENSITIVE Sensitive     * METHICILLIN RESISTANT STAPHYLOCOCCUS AUREUS  Blood Culture (routine x 2)     Status: Abnormal   Collection Time: 04/09/23  8:19 PM   Specimen: BLOOD RIGHT FOREARM  Result Value Ref Range Status   Specimen Description   Final    BLOOD RIGHT FOREARM Performed at [facility], [address] [place], [place] [address]    Special Requests   Final    BOTTLES DRAWN AEROBIC AND ANAEROBIC Blood Culture results may not be optimal due to an inadequate  volume of blood received in culture bottles  Performed at [facility], [address], [place], [place] [address]    Culture  Setup Time   Final    GRAM POSITIVE COCCI AEROBIC BOTTLE ONLY CRITICAL VALUE NOTED.  VALUE IS CONSISTENT WITH PREVIOUSLY REPORTED AND CALLED VALUE. GRAM STAIN REVIEWED-AGREE WITH RESULT DRT    Culture (A)  Final    STAPHYLOCOCCUS AUREUS SUSCEPTIBILITIES PERFORMED ON PREVIOUS CULTURE WITHIN THE LAST 5 DAYS. Performed at [facility], [address], [place], [place] [address]    Report Status 04/12/2023 FINAL  Final  Blood Culture ID Panel (Reflexed)     Status: Abnormal   Collection Time: 04/09/23  8:19 PM  Result Value Ref Range Status   Enterococcus faecalis NOT DETECTED NOT DETECTED Final   Enterococcus Faecium NOT DETECTED NOT DETECTED Final   Listeria monocytogenes NOT DETECTED NOT DETECTED Final   Staphylococcus species DETECTED (A) NOT DETECTED Final    Comment: CRITICAL RESULT CALLED TO, READ BACK BY AND VERIFIED WITH: MOSE BLEW PHARMD 1137 04/10/23 HNM    Staphylococcus aureus (BCID) DETECTED (A) NOT DETECTED Final    Comment: Methicillin (oxacillin)-resistant Staphylococcus aureus (MRSA). MRSA is predictably resistant to beta-lactam antibiotics (except ceftaroline). Preferred therapy is vancomycin  unless clinically contraindicated. Patient requires contact precautions if  hospitalized. CRITICAL RESULT CALLED TO, READ BACK BY AND VERIFIED WITH: MOSE BLEW PHARMD 1137 04/10/23 HNM    Staphylococcus epidermidis NOT DETECTED NOT DETECTED Final   Staphylococcus lugdunensis NOT DETECTED NOT DETECTED Final   Streptococcus species NOT DETECTED NOT DETECTED Final   Streptococcus agalactiae NOT DETECTED NOT DETECTED Final   Streptococcus pneumoniae NOT DETECTED NOT DETECTED Final   Streptococcus pyogenes NOT DETECTED NOT DETECTED Final   A.calcoaceticus-baumannii NOT DETECTED NOT DETECTED Final   Bacteroides fragilis NOT DETECTED NOT  DETECTED Final   Enterobacterales NOT DETECTED NOT DETECTED Final   Enterobacter cloacae complex NOT DETECTED NOT DETECTED Final   Escherichia coli NOT DETECTED NOT DETECTED Final   Klebsiella aerogenes NOT DETECTED NOT DETECTED Final   Klebsiella oxytoca NOT DETECTED NOT DETECTED Final   Klebsiella pneumoniae NOT DETECTED NOT DETECTED Final   Proteus species NOT DETECTED NOT DETECTED Final   Salmonella species NOT DETECTED NOT DETECTED Final   Serratia marcescens NOT DETECTED NOT DETECTED Final   Haemophilus influenzae NOT DETECTED NOT DETECTED Final   Neisseria meningitidis NOT DETECTED NOT DETECTED Final   Pseudomonas aeruginosa NOT DETECTED NOT DETECTED Final   Stenotrophomonas maltophilia NOT DETECTED NOT DETECTED Final   Candida albicans NOT DETECTED NOT DETECTED Final   Candida auris NOT DETECTED NOT DETECTED Final   Candida glabrata NOT DETECTED NOT DETECTED Final   Candida krusei NOT DETECTED NOT DETECTED Final   Candida parapsilosis NOT DETECTED NOT DETECTED Final   Candida tropicalis NOT DETECTED NOT DETECTED Final   Cryptococcus neoformans/gattii NOT DETECTED NOT DETECTED Final   Meth resistant mecA/C and MREJ DETECTED (A) NOT DETECTED Final    Comment: CRITICAL RESULT CALLED TO, READ BACK BY AND VERIFIED WITHBETHA MOSE BLEW PHARMD 1137 04/10/23 HNM Performed at [facility], [address] [place], [place] [address]   Respiratory (~20 pathogens) panel by PCR     Status: None   Collection Time: 04/10/23  1:27 AM   Specimen: Nasopharyngeal Swab; Respiratory  Result Value Ref Range Status   Adenovirus NOT DETECTED NOT DETECTED Final   Coronavirus 229E NOT DETECTED NOT DETECTED Final    Comment: (NOTE) The Coronavirus on the Respiratory Panel, DOES NOT test for the novel  Coronavirus (2019 nCoV)    Coronavirus HKU1 NOT DETECTED NOT DETECTED Final   Coronavirus NL63 NOT DETECTED NOT DETECTED Final   Coronavirus OC43 NOT DETECTED NOT DETECTED Final    Metapneumovirus NOT DETECTED NOT DETECTED Final   Rhinovirus / Enterovirus NOT DETECTED NOT DETECTED Final   Influenza A NOT DETECTED NOT DETECTED Final   Influenza B NOT DETECTED NOT DETECTED Final   Parainfluenza Virus 1 NOT DETECTED NOT DETECTED Final   Parainfluenza Virus 2 NOT DETECTED NOT DETECTED Final   Parainfluenza Virus 3 NOT DETECTED NOT DETECTED Final   Parainfluenza Virus 4 NOT DETECTED NOT DETECTED Final   Respiratory Syncytial Virus NOT DETECTED NOT DETECTED Final   Bordetella pertussis NOT DETECTED NOT DETECTED Final   Bordetella Parapertussis NOT DETECTED NOT DETECTED Final   Chlamydophila pneumoniae NOT DETECTED NOT DETECTED Final   Mycoplasma pneumoniae NOT DETECTED NOT DETECTED Final    Comment: Performed at [facility], [address] [place], [place] [address]  Urine Culture     Status: None   Collection Time: 04/10/23  1:27 AM   Specimen: Urine, Random  Result Value Ref Range Status   Specimen Description   Final    URINE, RANDOM Performed at [facility], [address] [place], [place] [address]    Special Requests   Final    NONE Reflexed from [id number] Performed at [facility], [address] [place], [place] [address]    Culture   Final    NO GROWTH Performed at [facility], [address] [place], [place] [address]    Report Status 04/11/2023 FINAL  Final  C Difficile Quick Screen w PCR reflex     Status: None   Collection Time: 04/10/23  2:00 PM   Specimen: STOOL  Result Value Ref Range Status   C Diff antigen NEGATIVE NEGATIVE Final   C Diff toxin NEGATIVE NEGATIVE Final   C Diff interpretation No C. difficile detected.  Final    Comment: Performed at [facility], [address] [place], [place] [address]  MRSA Next Gen by PCR, Nasal     Status: Abnormal   Collection Time: 04/10/23  7:18 PM   Specimen: Nasal Mucosa; Nasal Swab  Result Value Ref Range Status   MRSA by PCR Next Gen DETECTED (A)  NOT DETECTED Final    Comment: RESULT CALLED TO, READ BACK BY AND VERIFIED WITH:  JACOB MOORE AT 2346 04/10/23 JG (NOTE) The GeneXpert MRSA Assay (FDA approved for NASAL specimens only), is one component of a comprehensive MRSA colonization surveillance program. It is not intended to diagnose MRSA infection nor to guide or monitor treatment for MRSA infections. Test performance is not FDA approved in patients less than 4 years old. Performed at [facility], [address] [place], [place] [address]      Radiology Studies: ECHOCARDIOGRAM COMPLETE Result Date: 04/10/2023    ECHOCARDIOGRAM REPORT   Patient Name:   [name] Date of Exam: 04/10/2023 Medical Rec #:  [id number]            Height:       58.0 in Accession #:    [id number]           Weight:       299.8 lb Date of Birth:  [birth year]            BSA:          2.163 m Patient Age:  67 years             BP:           132/64 mmHg Patient Gender: M                    HR:           83 bpm. Exam Location:  [facility] Procedure: 2D Echo, Cardiac Doppler and Color Doppler Indications:     Endocarditis I38  History:         Patient has prior history of Echocardiogram examinations, most                  recent 04/23/2023. Risk Factors:Hypertension.  Sonographer:     Christopher Furnace Referring Phys:  [id number] LORANE POLAND Diagnosing Phys: Evalene Lunger MD  Sonographer Comments: Technically challenging study due to limited acoustic windows, no apical window and no subcostal window. IMPRESSIONS  1. Left ventricular ejection fraction, by estimation, is 55 to 60%. The left ventricle has normal function. The left ventricle has no regional wall motion abnormalities. The left ventricular internal cavity size was mildly dilated. Left ventricular diastolic parameters are indeterminate.  2. Right ventricular systolic function is normal. The right ventricular size is normal.  3. Left atrial size was mildly dilated.  4. The mitral valve is normal in  structure. No evidence of mitral valve regurgitation. No evidence of mitral stenosis.  5. The aortic valve is normal in structure. Aortic valve regurgitation is not visualized. No aortic stenosis is present.  6. There is mild dilatation of the aortic root, measuring 44 mm. There is moderate dilatation of the ascending aorta, measuring 46 mm.  7. The inferior vena cava is normal in size with greater than 50% respiratory variability, suggesting right atrial pressure of 3 mmHg. FINDINGS  Left Ventricle: Left ventricular ejection fraction, by estimation, is 55 to 60%. The left ventricle has normal function. The left ventricle has no regional wall motion abnormalities. The left ventricular internal cavity size was mildly dilated. There is  no left ventricular hypertrophy. Left ventricular diastolic parameters are indeterminate. Right Ventricle: The right ventricular size is normal. No increase in right ventricular wall thickness. Right ventricular systolic function is normal. Left Atrium: Left atrial size was mildly dilated. Right Atrium: Right atrial size was normal in size. Pericardium: There is no evidence of pericardial effusion. Mitral Valve: The mitral valve is normal in structure. There is mild calcification of the mitral valve leaflet(s). No evidence of mitral valve regurgitation. No evidence of mitral valve stenosis. Tricuspid Valve: The tricuspid valve is normal in structure. Tricuspid valve regurgitation is not demonstrated. No evidence of tricuspid stenosis. Aortic Valve: The aortic valve is normal in structure. Aortic valve regurgitation is not visualized. No aortic stenosis is present. Pulmonic Valve: The pulmonic valve was normal in structure. Pulmonic valve regurgitation is not visualized. No evidence of pulmonic stenosis. Aorta: The aortic root is normal in size and structure. There is mild dilatation of the aortic root, measuring 44 mm. There is moderate dilatation of the ascending aorta, measuring 46  mm. Venous: The inferior vena cava is normal in size with greater than 50% respiratory variability, suggesting right atrial pressure of 3 mmHg. IAS/Shunts: No atrial level shunt detected by color flow Doppler.  LEFT VENTRICLE PLAX 2D LVIDd:         5.70 cm LVIDs:         3.40 cm LV PW:         0.90  cm LV IVS:        1.10 cm LVOT diam:     2.20 cm LVOT Area:     3.80 cm  LEFT ATRIUM         Index LA diam:    4.30 cm 1.99 cm/m   AORTA Ao Root diam: 4.70 cm  SHUNTS Systemic Diam: 2.20 cm Evalene Lunger MD Electronically signed by Evalene Lunger MD Signature Date/Time: 04/10/2023/4:34:27 PM    Final    DG Chest 1 View Result Date: 04/10/2023 CLINICAL DATA:  Dyspnea. EXAM: CHEST  1 VIEW COMPARISON:  04/09/2023 FINDINGS: Single-view of the chest demonstrates very low lung volumes. Again noted is a right jugular Port-A-Cath and the tip is near the superior cavoatrial junction but poorly characterized. Cardiac silhouette is prominent due to low lung volumes. Again noted are postsurgical changes in the lower cervical spine and cervicothoracic junction. Sclerotic lesions are noted in the left clavicle and compatible with history of prostate cancer. IMPRESSION: 1. Low lung volumes without acute findings. 2. Sclerotic lesions in the left clavicle compatible with history of prostate cancer. Electronically Signed   By: Juliene Balder M.D.   On: 04/10/2023 14:47    Scheduled Meds:  buPROPion   300 mg Oral Daily   furosemide   20 mg Oral BID   gabapentin   600 mg Oral TID   ipratropium-albuterol   3 mL Nebulization Q4H   leptospermum manuka honey  1 Application Topical Daily   lisinopril   10 mg Oral Daily   methylPREDNISolone  (SOLU-MEDROL ) injection  80 mg Intravenous Q0600   polyethylene glycol  17 g Oral BID   vancomycin  variable dose per unstable renal function (pharmacist dosing)   Does not apply See admin instructions   Warfarin - Pharmacist Dosing Inpatient   Does not apply q1600   Continuous Infusions:     LOS:  2 days    Time spent:  55min  Vincy Feliz, DO [facility]  To contact the attending physician between 7A-7P please use Epic Chat. To contact the covering physician during after hours 7P-7A, please review Amion.   04/12/2023, 7:27 AM   *This document has been created with the assistance of dictation software. Please excuse typographical errors. *

## 2023-04-12 NOTE — Progress Notes (Signed)
 Date of Admission:  04/09/2023   T ID: [name] is a 68 y.o. male  Principal Problem:   HAP (hospital-acquired pneumonia) Active Problems:   Prostate cancer metastatic to bone [facility])   PE (pulmonary thromboembolism) (HCC)   Essential hypertension   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   Ogilvie syndrome   Severe sepsis (HCC)   AKI (acute kidney injury) (HCC)   MRSA bacteremia   Pulmonary infiltrates   Septic embolism (HCC)   Palliative care encounter    Subjective: Is in his room , feeling comfortable and happy that he moved out of ED   Medications:   acetaminophen   650 mg Oral Q6H   buPROPion   300 mg Oral Daily   furosemide   20 mg Oral BID   gabapentin   600 mg Oral TID   ipratropium-albuterol   3 mL Nebulization Q4H   leptospermum manuka honey  1 Application Topical Daily   lidocaine   1 patch Transdermal Q24H   lisinopril   10 mg Oral Daily   nystatin  cream   Topical BID   polyethylene glycol  17 g Oral BID   potassium chloride   40 mEq Oral Q4H   [START ON 04/13/2023] predniSONE   50 mg Oral Q breakfast   Warfarin - Pharmacist Dosing Inpatient   Does not apply q1600    Objective: Vital signs in last 24 hours: Patient Vitals for the past 24 hrs:  BP Temp Temp src Pulse Resp SpO2  04/12/23 1737 -- -- -- 91 -- 94 %  04/12/23 1736 (!) 152/128 98.3 F (36.8 C) -- (!) 171 -- 96 %  04/12/23 1309 (!) 142/115 98.2 F (36.8 C) Oral 88 16 96 %  04/12/23 1046 -- 98.1 F (36.7 C) Oral -- -- --  04/12/23 0934 120/85 -- -- -- -- --  04/12/23 0930 120/85 -- -- 80 (!) 28 97 %  04/12/23 0741 -- -- -- -- -- 98 %  04/12/23 0700 (!) 118/94 -- -- 72 17 97 %  04/12/23 0637 -- 98.3 F (36.8 C) Oral -- -- --  04/12/23 0600 113/66 -- -- 72 16 100 %  04/12/23 0241 -- 98.1 F (36.7 C) Oral -- -- --  04/12/23 0200 127/73 -- -- 70 16 96 %  04/12/23 0130 109/63 -- -- 72 12 96 %  04/12/23 0100 108/64 -- -- 74 13 96 %  04/12/23 0030 120/70 -- -- 82 19 94 %   04/12/23 0000 111/61 -- -- 79 12 95 %  04/11/23 2330 101/60 -- -- 80 13 97 %  04/11/23 2300 116/68 -- -- 72 16 98 %  04/11/23 2230 104/74 97.8 F (36.6 C) Oral 73 13 98 %  04/11/23 2145 -- -- -- 77 16 95 %  04/11/23 2105 -- -- -- 80 17 98 %  04/11/23 2100 -- -- -- 88 13 96 %  04/11/23 2055 -- -- -- 98 18 94 %  04/11/23 2050 -- -- -- (!) 106 (!) 23 (!) 78 %  04/11/23 2000 119/72 -- -- 83 18 94 %  04/11/23 1915 117/61 98.7 F (37.1 C) Oral 90 (!) 21 94 %      PHYSICAL EXAM:  General: Alert, cooperative, no distress, appears stated age.  Head: Normocephalic, without obvious abnormality, atraumatic. Eyes: Conjunctivae clear, anicteric sclerae. Pupils are equal ENT Nares normal. No drainage or sinus tenderness. Lips, mucosa, and tongue normal. No Thrush Neck: Supple, symmetrical, no adenopathy, thyroid: non tender no carotid bruit and no  JVD. Back: No CVA tenderness. Lungs: Bilateral air entry Heart: S1-S2. Abdomen: Soft, non-tender,not distended. Bowel sounds normal. No masses Extremities: Bilateral AKA  skin: No rashes or lesions. Or bruising Lymph: Cervical, supraclavicular normal. Neurologic: Grossly non-focal  Lab Results    Latest Ref Rng & Units 04/12/2023    7:43 AM 04/10/2023   10:51 AM 04/09/2023    8:25 PM  CBC  WBC 4.0 - 10.5 K/uL 12.4  16.3  20.0   Hemoglobin 13.0 - 17.0 g/dL 89.3  89.1  89.0   Hematocrit 39.0 - 52.0 % 31.7  31.4  31.4   Platelets 150 - 400 K/uL 274  229  299        Latest Ref Rng & Units 04/12/2023    7:43 AM 04/12/2023    5:50 AM 04/11/2023    6:03 AM  CMP  Glucose 70 - 99 mg/dL 860   832   BUN 8 - 23 mg/dL 54   61   Creatinine 9.38 - 1.24 mg/dL 8.70  8.64  7.89   Sodium 135 - 145 mmol/L 136   133   Potassium 3.5 - 5.1 mmol/L 2.8   2.8   Chloride 98 - 111 mmol/L 104   100   CO2 22 - 32 mmol/L 22   19   Calcium 8.9 - 10.3 mg/dL 89.7   9.8   Total Protein 6.5 - 8.1 g/dL 6.8     Total Bilirubin 0.0 - 1.2 mg/dL 0.6     Alkaline Phos 38 - 126  U/L 58     AST 15 - 41 U/L 25     ALT 0 - 44 U/L 25         Microbiology:  Studies/Results: DG Chest Port 1 View Result Date: 04/12/2023 CLINICAL DATA:  Dyspnea and respiratory abnormality EXAM: PORTABLE CHEST 1 VIEW COMPARISON:  04/10/2023 FINDINGS: Low lung volumes accentuate cardiomediastinal silhouette and pulmonary vascularity. Bibasilar atelectasis or infiltrates. Question small left pleural effusion. No pneumothorax. Cervical spine fusion hardware. IMPRESSION: Low lung volumes with bibasilar atelectasis or infiltrates. Question small left pleural effusion. Electronically Signed   By: Norman Gatlin M.D.   On: 04/12/2023 13:12     Assessment/Plan:  MRSA bacteremia With port in place and multiple nodules in the lungs concern for septic emboli and endocarditis of tricuspid valve 2 d echo TEE once resp status is stable On vancomycin  adjusted to crcl Daptomycin is not an option because of  lung infiltrate PORT will have to be removed but because INR is high awaiting stabilization of that.  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

## 2023-04-12 NOTE — ED Notes (Signed)
 This RN called lab to assist with collecting blood cultures from R arm after 2 stick attempts

## 2023-04-12 NOTE — Consult Note (Signed)
 PHARMACY - ANTICOAGULATION CONSULT NOTE  Pharmacy Consult for Warfarin Indication: hx recurrent VTE  Patient Measurements: Height: 4' 10 (147.3 cm) Weight: (!) 136.2 kg (300 lb 4.8 oz) IBW/kg (Calculated) : 45.4  Labs: Recent Labs    04/09/23 2025 04/09/23 2132 04/10/23 0127 04/10/23 1051  HGB 10.9*  --   --  10.8*  HCT 31.4*  --   --  31.4*  PLT 299  --   --  229  APTT  --   --  55*  --   LABPROT  --   --  30.6*  --   INR  --   --  2.9*  --   CREATININE 3.89*  --   --  3.11*  TROPONINIHS  --  22*  --   --    Estimated Creatinine Clearance: 26.6 mL/min (A) (by C-G formula based on SCr of 3.11 mg/dL (H)).  Medical History: Past Medical History:  Diagnosis Date   Depression    History of blood clots 08/13/2011   History of left knee replacement    HTN (hypertension)    Lymphedema    lt leg-per pt   PE (pulmonary thromboembolism) (HCC)    Pressure injury of skin, unspecified injury stage, unspecified location    S/P AKA (above knee amputation) bilateral (HCC)    rt 11/24/07 and lt 01/11/17   PTA warfarin dosing: Warfarin dosing difficult to determine. Went to speak with patient who could not give a firm regimen. Base dose is 5 mg but phones in INR weekly with clinic. He estimates an average of warfarin 6 mg daily. Last dose was 2/4 morning.  Assessment: 68 y.o. 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 Patient was hospitalized last month for recurrent VTE. Patient was on warfarin at the time of that VTE, but at presentation INR was subtherapeutic at 1.2 due to lack of enoxaparin  bridge, and so warfarin was seen as not a therapeutic failure.  For this admission, pharmacy has been consulted to manage warfarin dosing. Given history of recurrent VTE on anticoagulation, I would target upper end of INR goal  anyways.  New drug-drug interactions: N/A  Goal of Therapy:  INR 2-3 >> would aim for 2.5-3 Monitor platelets by anticoagulation protocol: Yes  Monitoring:  Date INR Assessment Warfarin Dose   2/5 2.9 Therapeutic 6 mg  2/6 4 Supratherapeutic Hold  2/7 4.5 Supratherapeutic Hold   Plan:  --INR is supratherapeutic --Hold warfarin today --Appears patient may need procedures (port removal / TEE). F/u reversal if indicated. Patient may need bridging peri-operatively --Daily INR per protocol  Marolyn KATHEE Mare 04/10/2023 11:30 AM

## 2023-04-12 NOTE — TOC CM/SW Note (Signed)
 Transition of Care [facility]) - Inpatient Brief Assessment   Patient Details  Name: [name] MRN: [id number] Date of Birth: 1955-05-05  [facility] CM/SW Contact:    Lauraine JAYSON Carpen, LCSW Phone Number: 04/12/2023, 12:35 PM   Clinical Narrative: Per chart review, patient was supposed to admit to [facility] yesterday but came to the hospital. CSW called liaison who confirmed they are a home hospice agency. They will call back to notify CSW of what they need when patient is discharged.  Transition of Care Asessment: Insurance and Status: Insurance coverage has been reviewed Patient has primary care physician: Yes Home environment has been reviewed: Single family home Prior level of function:: Not documented Prior/Current Home Services: No current home services Social Drivers of Health Review: SDOH reviewed no interventions necessary Readmission risk has been reviewed: Yes Transition of care needs: transition of care needs identified, TOC will continue to follow

## 2023-04-12 NOTE — Progress Notes (Signed)
   04/12/23 1030  Spiritual Encounters  Type of Visit Initial  Care provided to: Patient  Referral source Chaplain team;Nurse (RN/NT/LPN)  Reason for visit Advance directives  OnCall Visit No  Intervention Outcomes  Outcomes Declined chaplain visit  Spiritual Care Plan  Spiritual Care Issues Still Outstanding No further spiritual care needs at this time (see row info)

## 2023-04-12 NOTE — Consult Note (Signed)
 Palliative Medicine [facility] [phone] [phone]   Name: [name] Date: 04/12/2023 MRN: [id number]  DOB: 1955/12/12  Patient Care Team: Eliverto Bette Hover, MD as PCP - General (Family Medicine) Rennie Cindy SAUNDERS, MD as Consulting Physician (Oncology) Lenn Aran, MD as Consulting Physician (Radiation Oncology)    REASON FOR CONSULTATION: [name] is a 68 y.o. 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  Patient status post 4 cycles of Taxotere  with chemotherapy Hold since December 2024 due to overall poor tolerance and worsening neuropathic pain.  Patient has had multiple recent hospitalizations.  He was hospitalized 03/23/2023 04/05/2023 with recurrent DVT with hospitalization complicated by acute on chronic diastolic CHF and acute colonic pseudoobstruction.  Patient now readmitted 04/10/2023 with sepsis from pneumonia and also with MRSA bacteremia.  Palliative care consulted to address goals.  SOCIAL HISTORY:     reports that he has never smoked. He has quit using smokeless tobacco. He reports current alcohol use of about 3.0 standard drinks of alcohol per week. He reports that he does not use drugs.  Patient is unmarried.  Lives at home alone with his dog, Lavera.  Patient has a daughter who lives in [place] . Patient has an Conservation Officer, [employer] and was previously an programmer, systems.   ADVANCE DIRECTIVES:  In process  CODE STATUS: DNR  PAST MEDICAL HISTORY: Past Medical History:  Diagnosis Date   Depression    History of blood clots 08/13/2011   History of left knee replacement    HTN (hypertension)    Lymphedema    lt leg-per pt   PE (pulmonary thromboembolism) (HCC)    Pressure injury of skin, unspecified injury stage, unspecified location    S/P AKA (above  knee amputation) bilateral (HCC)    rt 11/24/07 and lt 01/11/17    PAST SURGICAL HISTORY:  Past Surgical History:  Procedure Laterality Date   ACHILLES TENDON SURGERY Left    per pt   BIOPSY  04/03/2023   Procedure: BIOPSY;  Surgeon: Jinny Carmine, MD;  Location: [facility] ENDOSCOPY;  Service: Endoscopy;;   bone clip removal Left 10/04/2015   knee   CARDIAC SURGERY  11/2012   cath.-per pt   CERVICAL FUSION  07/28/2015   per pt   COLONOSCOPY WITH PROPOFOL  N/A 04/03/2023   Procedure: COLONOSCOPY WITH PROPOFOL ;  Surgeon: Jinny Carmine, MD;  Location: [facility] ENDOSCOPY;  Service: Endoscopy;  Laterality: N/A;   EPIGASTRIC HERNIA REPAIR     per pt   hematoma removal Left 11/02/2015   knee   HIP SURGERY Right    x8 per pt   IR IMAGING GUIDED PORT INSERTION  11/14/2022   KNEE FUSION Right    KNEE JOINT MANIPULATION Left 09/11/2011   KNEE SURGERY Left    x3   leg stump Left 03/2017   leg surgery-per pt   NECK SURGERY  02/2015   plates-per pt   REPLACEMENT TOTAL KNEE Right    REPLACEMENT TOTAL KNEE Left 07/02/2011   again in 07/10/2011 per pt   s/p of bilateral AKA Right    screen filter removal  10/20/2008   for embolism per pt   screen filter replacement     x3   SHOULDER SURGERY Right    x2-per pt   SPINE SURGERY  02/11/2015   vertebra spacer-per pt   toe nail removal Right  per pt   VARICOSE VEIN SURGERY     x5-per pt    HEMATOLOGY/ONCOLOGY HISTORY:  Oncology History Overview Note  #HIGH RISK/ DE-NOVO CASTRATE SENSITIVE PROSTATE CANCER: CT CAP [JULY 2024]-multiple bone metastases; MRI lumbar spine without contrast  [PCP]-L4-L5 lesion.  On the thecal sac.  PSA 700.   Check guardant testing. NO Biopsy. AUG 9th, 2024- PSMA PET - Multifocal intense radiotracer activity within the prostate gland consistent with primary prostate adenocarcinoma; Evidence of nodal metastasis with radiotracer avid tiny lymph nodes in the rectal sheath and periaortic retroperitoneum; Extensive radiotracer  avid skeletal metastasis with near confluent lesions within the pelvis. Lesions in the lumbar spine, RIGHT scapula, LEFT humerus. Lesions too numerous to count. No evidence of visceral metastasis or pulmonary metastasis; awaiting guardant testing. On ADT- [started aug 1st, 2024].   # AUG 1st, 2024- Firmagon /Eligard  + Nubequa' s/p Taxoetere X4 cycles [stopped December 2023-secondary to worsening phantom pain/neuropathy]   --------------------- # CAD; on coumadin ; Bil AKA    Prostate cancer metastatic to bone (HCC)  09/30/2022 Initial Diagnosis   Prostate cancer metastatic to bone (HCC)   10/04/2022 Cancer Staging   Staging form: Prostate, AJCC 8th Edition - Clinical: Stage IVB (cTX, cNX, pM1b, PSA: 703) - Signed by Rennie Cindy SAUNDERS, MD on 10/04/2022 Prostate specific antigen (PSA) range: 20 or greater   11/29/2022 -  Chemotherapy   Patient is on Treatment Plan : PROSTATE Docetaxel  (75) q21d      Genetic Testing   Negative genetic testing. No pathogenic variants identified on the [facility] Common Hereditary Cancers+RNA panel. The report date is 01/09/2023.  The Common Hereditary Cancers Panel + RNA offered by [facility] includes sequencing and/or deletion duplication testing of the following 48 genes: APC*, ATM*, AXIN2, BAP1, BARD1, BMPR1A, BRCA1, BRCA2, BRIP1, CDH1, CDK4, CDKN2A (p14ARF), CDKN2A (p16INK4a), CHEK2, CTNNA1, DICER1*, EPCAM*, FH*, GREM1*, HOXB13, KIT, MBD4, MEN1*, MLH1*, MSH2*, MSH3*, MSH6*, MUTYH, NF1*, NTHL1, PALB2, PDGFRA, PMS2*, POLD1*, POLE, PTEN*, RAD51C, RAD51D, SDHA*, SDHB, SDHC*, SDHD, SMAD4, SMARCA4, STK11, TP53, TSC1*, TSC2, VHL.      ALLERGIES:  is allergic to bacitracin, vancomycin , ketorolac, and spironolactone.  MEDICATIONS:  Current Facility-Administered Medications  Medication Dose Route Frequency Provider Last Rate Last Admin   buPROPion  (WELLBUTRIN  XL) 24 hr tablet 300 mg  300 mg Oral Daily Norins, Michael E, MD   300 mg at 04/12/23 9066   furosemide  (LASIX )  tablet 20 mg  20 mg Oral BID Belue, Nathan S, RPH   20 mg at 04/12/23 9244   gabapentin  (NEURONTIN ) capsule 600 mg  600 mg Oral TID Norins, Michael E, MD   600 mg at 04/12/23 0933   HYDROmorphone  (DILAUDID ) injection 0.5 mg  0.5 mg Intravenous Q3H PRN Dezii, Alexandra, DO       HYDROmorphone  (DILAUDID ) injection 1 mg  1 mg Intravenous Once Dezii, Alexandra, DO       ipratropium-albuterol  (DUONEB) 0.5-2.5 (3) MG/3ML nebulizer solution 3 mL  3 mL Nebulization Q4H Dezii, Alexandra, DO   3 mL at 04/12/23 0746   lactulose  (CHRONULAC ) 10 GM/15ML solution 20 g  20 g Oral BID PRN Norins, Michael E, MD       leptospermum manuka honey (MEDIHONEY) paste 1 Application  1 Application Topical Daily Norins, Michael E, MD   1 Application at 04/12/23 0950   lisinopril  (ZESTRIL ) tablet 10 mg  10 mg Oral Daily Norins, Michael E, MD   10 mg at 04/12/23 0934   methylPREDNISolone  sodium succinate (SOLU-MEDROL ) 125 mg/2 mL injection 80 mg  80 mg Intravenous Q0600 Norins, Michael E, MD   80 mg at 04/12/23 9355   nystatin  cream (MYCOSTATIN )   Topical BID Dezii, Alexandra, DO       ondansetron  (ZOFRAN ) tablet 8 mg  8 mg Oral Q8H PRN Norins, Michael E, MD       oxyCODONE -acetaminophen  (PERCOCET/ROXICET) 5-325 MG per tablet 1 tablet  1 tablet Oral Q6H PRN Marleen Moret R, NP       phytonadione  (VITAMIN K ) 1 mg in dextrose  5 % 50 mL IVPB  1 mg Intravenous Once Dezii, Alexandra, DO       polyethylene glycol (MIRALAX  / GLYCOLAX ) packet 17 g  17 g Oral BID Belue, Nathan S, RPH       potassium chloride  SA (KLOR-CON  M) CR tablet 40 mEq  40 mEq Oral Q4H Dezii, Alexandra, DO       vancomycin  (VANCOREADY) IVPB 1500 mg/300 mL  1,500 mg Intravenous Q24H Zeigler, Dustin G, RPH 100 mL/hr at 04/12/23 1046 1,500 mg at 04/12/23 1046   Warfarin - Pharmacist Dosing Inpatient   Does not apply q1600 Lenon Elsie HERO, [facility]   Given at 04/10/23 1635   Current Outpatient Medications  Medication Sig Dispense Refill   buPROPion  (WELLBUTRIN  XL)  300 MG 24 hr tablet Take 300 mg by mouth daily.     [Paused] darolutamide  (NUBEQA ) 300 MG tablet Take 2 tablets (600 mg total) by mouth 2 (two) times daily with a meal. 120 tablet 1   diphenoxylate -atropine  (LOMOTIL ) 2.5-0.025 MG tablet Take 1 tablet by mouth 4 (four) times daily as needed for diarrhea or loose stools. 30 tablet 0   gabapentin  (NEURONTIN ) 300 MG capsule Take 2 capsules (600 mg total) by mouth 3 (three) times daily. 180 capsule 3   leptospermum manuka honey (MEDIHONEY) PSTE paste Apply 1 Application topically daily. 30 mL 2   lidocaine -prilocaine  (EMLA ) cream Apply 1 Application topically as needed. 30 g 0   lisinopril  (ZESTRIL ) 10 MG tablet Take 1 tablet (10 mg total) by mouth daily. 30 tablet 0   ondansetron  (ZOFRAN ) 8 MG tablet Take 1 tablet (8 mg total) by mouth every 8 (eight) hours as needed for nausea or vomiting. 60 tablet 1   oxyCODONE -acetaminophen  (PERCOCET/ROXICET) 5-325 MG tablet Take 1 tablet by mouth every 12 (twelve) hours as needed for moderate pain (pain score 4-6). 60 tablet 0   polyethylene glycol powder (GLYCOLAX /MIRALAX ) 17 GM/SCOOP powder Take 17 g by mouth 2 (two) times daily. Mix as directed. 238 g 0   psyllium (HYDROCIL/METAMUCIL) 95 % PACK Take 1 packet by mouth daily. 30 packet 0   senna-docusate (SENOKOT-S) 8.6-50 MG tablet Take 2 tablets by mouth 2 (two) times daily as needed for mild constipation. 100 tablet 0   warfarin (COUMADIN ) 5 MG tablet Take 5-7.5 mg by mouth daily.     warfarin (COUMADIN ) 5 MG tablet Take 5 mg by mouth daily at 4 PM.     furosemide  (LASIX ) 20 MG tablet Take 1 tablet (20 mg total) by mouth 2 (two) times daily. 60 tablet 0   lactulose  (CHRONULAC ) 10 GM/15ML solution Take 30 mLs (20 g total) by mouth 2 (two) times daily as needed for mild constipation. 236 mL 0    VITAL SIGNS: BP 120/85   Pulse 80   Temp 98.1 F (36.7 C) (Oral)   Resp (!) 28   Ht 4' 10 (1.473 m)   Wt 299 lb 13.2 oz (136 kg)   SpO2 97%   BMI 62.66 kg/m  Filed Weights   04/09/23 2019  Weight: 299 lb 13.2 oz (136 kg)    Estimated body mass index is 62.66 kg/m as calculated from the following:   Height as of this encounter: 4' 10 (1.473 m).   Weight as of this encounter: 299 lb 13.2 oz (136 kg).  LABS: CBC:    Component Value Date/Time   WBC 12.4 (H) 04/12/2023 0743   HGB 10.6 (L) 04/12/2023 0743   HGB 8.9 (L) 02/25/2023 0820   HCT 31.7 (L) 04/12/2023 0743   PLT 274 04/12/2023 0743   PLT 193 02/25/2023 0820   MCV 92.4 04/12/2023 0743   NEUTROABS 11.4 (H) 04/12/2023 0743   LYMPHSABS 0.3 (L) 04/12/2023 0743   MONOABS 0.7 04/12/2023 0743   EOSABS 0.0 04/12/2023 0743   BASOSABS 0.0 04/12/2023 0743   Comprehensive Metabolic Panel:    Component Value Date/Time   NA 136 04/12/2023 0743   K 2.8 (L) 04/12/2023 0743   CL 104 04/12/2023 0743   CO2 22 04/12/2023 0743   BUN 54 (H) 04/12/2023 0743   CREATININE 1.29 (H) 04/12/2023 0743   CREATININE 0.75 02/25/2023 0820   GLUCOSE 139 (H) 04/12/2023 0743   CALCIUM 10.2 04/12/2023 0743   AST 25 04/12/2023 0743   AST 17 02/25/2023 0820   ALT 25 04/12/2023 0743   ALT 12 02/25/2023 0820   ALKPHOS 58 04/12/2023 0743   BILITOT 0.6 04/12/2023 0743   BILITOT 1.7 (H) 02/25/2023 0820   PROT 6.8 04/12/2023 0743   ALBUMIN  3.1 (L) 04/12/2023 0743    RADIOGRAPHIC STUDIES: ECHOCARDIOGRAM COMPLETE Result Date: 04/10/2023    ECHOCARDIOGRAM REPORT   Patient Name:   [name] Date of Exam: 04/10/2023 Medical Rec #:  [id number]            Height:       58.0 in Accession #:    [id number]           Weight:       299.8 lb Date of Birth:  1956/02/29            BSA:          2.163 m Patient Age:    67 years             BP:           132/64 mmHg Patient Gender: M                    HR:           83 bpm. Exam Location:  [facility] Procedure: 2D Echo, Cardiac Doppler and Color Doppler Indications:     Endocarditis I38  History:         Patient has prior history of Echocardiogram examinations, most                   recent 04/23/2023. Risk Factors:Hypertension.  Sonographer:     Christopher Furnace Referring Phys:  [id number] LORANE POLAND Diagnosing Phys: Evalene Lunger MD  Sonographer Comments: Technically challenging study due to limited acoustic windows, no apical window and no subcostal window. IMPRESSIONS  1. Left ventricular ejection fraction, by estimation, is 55 to 60%. The left ventricle has normal function. The left ventricle has no regional wall motion abnormalities. The left ventricular internal cavity size was mildly dilated. Left ventricular diastolic parameters are indeterminate.  2. Right ventricular systolic function is normal. The right ventricular size is normal.  3. Left atrial size was mildly dilated.  4. The mitral valve is normal in structure. No evidence of mitral valve regurgitation. No evidence of mitral stenosis.  5. The aortic valve is normal in structure. Aortic valve regurgitation is not visualized. No aortic stenosis is present.  6. There is mild dilatation of the aortic root, measuring 44 mm. There is moderate dilatation of the ascending aorta, measuring 46 mm.  7. The inferior vena cava is normal in size with greater than 50% respiratory variability, suggesting right atrial pressure of 3 mmHg. FINDINGS  Left Ventricle: Left ventricular ejection fraction, by estimation, is 55 to 60%. The left ventricle has normal function. The left ventricle has no regional wall motion abnormalities. The left ventricular internal cavity size was mildly dilated. There is  no left ventricular hypertrophy. Left ventricular diastolic parameters are indeterminate. Right Ventricle: The right ventricular size is normal. No increase in right ventricular wall thickness. Right ventricular systolic function is normal. Left Atrium: Left atrial size was mildly dilated. Right Atrium: Right atrial size was normal in size. Pericardium: There is no evidence of pericardial effusion. Mitral Valve: The mitral valve is normal in  structure. There is mild calcification of the mitral valve leaflet(s). No evidence of mitral valve regurgitation. No evidence of mitral valve stenosis. Tricuspid Valve: The tricuspid valve is normal in structure. Tricuspid valve regurgitation is not demonstrated. No evidence of tricuspid stenosis. Aortic Valve: The aortic valve is normal in structure. Aortic valve regurgitation is not visualized. No aortic stenosis is present. Pulmonic Valve: The pulmonic valve was normal in structure. Pulmonic valve regurgitation is not visualized. No evidence of pulmonic stenosis. Aorta: The aortic root is normal in size and structure. There is mild dilatation of the aortic root, measuring 44 mm. There is moderate dilatation of the ascending aorta, measuring 46 mm. Venous: The inferior vena cava is normal in size with greater than 50% respiratory variability, suggesting right atrial pressure of 3 mmHg. IAS/Shunts: No atrial level shunt detected by color flow Doppler.  LEFT VENTRICLE PLAX 2D LVIDd:         5.70 cm LVIDs:         3.40 cm LV PW:         0.90 cm LV IVS:        1.10 cm LVOT diam:     2.20 cm LVOT Area:     3.80 cm  LEFT ATRIUM         Index LA diam:    4.30 cm 1.99 cm/m   AORTA Ao Root diam: 4.70 cm  SHUNTS Systemic Diam: 2.20 cm Evalene Lunger MD Electronically signed by Evalene Lunger MD Signature Date/Time: 04/10/2023/4:34:27 PM    Final    DG Chest 1 View Result Date: 04/10/2023 CLINICAL DATA:  Dyspnea. EXAM: CHEST  1 VIEW COMPARISON:  04/09/2023 FINDINGS: Single-view of the chest demonstrates very low lung volumes. Again noted is a right jugular Port-A-Cath and the tip is near the superior cavoatrial junction but poorly characterized. Cardiac silhouette is prominent due to low lung volumes. Again noted are postsurgical changes in the lower cervical spine and cervicothoracic junction. Sclerotic lesions are noted in the left clavicle and compatible with history of prostate cancer. IMPRESSION: 1. Low lung volumes  without acute findings. 2. Sclerotic lesions in the left clavicle compatible with history of prostate cancer. Electronically Signed   By: Juliene Balder M.D.   On: 04/10/2023 14:47   CT CHEST ABDOMEN PELVIS WO CONTRAST Result Date: 04/10/2023 CLINICAL DATA:  Sepsis, metastatic prostate cancer, altered mental  status, dyspnea. * Tracking Code: BO * EXAM: CT CHEST, ABDOMEN AND PELVIS WITHOUT CONTRAST TECHNIQUE: Multidetector CT imaging of the chest, abdomen and pelvis was performed following the standard protocol without IV contrast. RADIATION DOSE REDUCTION: This exam was performed according to the departmental dose-optimization program which includes automated exposure control, adjustment of the mA and/or kV according to patient size and/or use of iterative reconstruction technique. COMPARISON:  03/26/2023 FINDINGS: CT CHEST FINDINGS Cardiovascular: No significant coronary artery calcification. Global cardiac size is mildly enlarged. No pericardial effusion. Central pulmonary arteries are mildly enlarged suggesting changes of pulmonary arterial hypertension. Mild atherosclerotic calcification within the thoracic aorta. No aortic aneurysm. Right internal jugular chest port tip seen at the superior cavoatrial junction. Mediastinum/Nodes: No enlarged mediastinal, hilar, or axillary lymph nodes. Thyroid gland, trachea, and esophagus demonstrate no significant findings. Lungs/Pleura: Multiple peripheral rounded masses are seen within the lungs measuring up to 18 x 21 mm with the index lesion within the left upper lobe demonstrating central cavitation and a ground-glass halo suspicious for multiple septic emboli particularly as these appear new within the lung bases since prior examination. A small bilateral pleural effusions are present with bibasilar compressive atelectasis. No pneumothorax. No central obstructing lesion. Musculoskeletal: Numerous sclerotic metastases are seen throughout the visualized axial skeleton. No  pathologic fracture identified. CT ABDOMEN PELVIS FINDINGS Hepatobiliary: Cholelithiasis without pericholecystic inflammatory change noted. Liver unremarkable in this noncontrast examination. No intra or extrahepatic biliary ductal dilation. Pancreas: Unremarkable Spleen: Unremarkable Adrenals/Urinary Tract: The adrenal glands are unremarkable. The kidneys are normal in size and position. Multiple root hyperdense renal cysts are seen within the left kidney which were not metabolically active on PET CT examination of 10/12/2022 and further follow-up is not required. The kidneys are otherwise unremarkable. Bladder is unremarkable. Stomach/Bowel: The distal colon and rectum are fluid-filled in keeping with a diarrheal state. The stomach, small bowel, large bowel otherwise unremarkable. Appendix normal. No free intraperitoneal gas or fluid. Vascular/Lymphatic: Aortic atherosclerosis. No enlarged abdominal or pelvic lymph nodes. Reproductive: Prostate is unremarkable. Other: No abdominal wall hernia or abnormality. No abdominopelvic ascites. 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 No pathologic fracture. IMPRESSION: 1. Multiple peripheral rounded masses within the lungs measuring up to 18 x 21 mm with the index lesion within the left upper lobe demonstrating central cavitation and a ground-glass Halo suspicious for multiple septic emboli, a a particularly as these appear new within the lung bases since prior examination. 2. Small bilateral pleural effusions with bibasilar compressive atelectasis. 3. 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 No pathologic fracture identified. 4. Cholelithiasis. 5. Fluid-filled distal colon and rectum in keeping with a diarrheal state. Aortic Atherosclerosis (ICD10-I70.0).  Electronically Signed   By: Dorethia Molt M.D.   On: 04/10/2023 00:08   DG Chest Portable 1 View Result Date: 04/09/2023 CLINICAL DATA:  Shortness of breath EXAM: PORTABLE CHEST 1 VIEW COMPARISON:  03/29/2023 FINDINGS: There is a right chest wall power-injectable Port-A-Cath with tip at the cavoatrial junction via a right internal jugular vein approach. Mild cardiomegaly. Mild left basilar opacity, likely atelectasis. IMPRESSION: Mild left basilar opacity, likely atelectasis. Electronically Signed   By: Franky Stanford M.D.   On: 04/09/2023 20:58   DG Abd 1 View Result Date: 04/03/2023 CLINICAL DATA:  Ileus. EXAM: ABDOMEN - 1 VIEW COMPARISON:  March 30, 2023. FINDINGS: Stable probable large bowel  dilatation. No definite small bowel dilatation is noted. No abnormal calcifications. IMPRESSION: Stable probable large bowel dilatation is noted suggesting ileus. Electronically Signed   By: Lynwood Landy Raddle M.D.   On: 04/03/2023 12:54   DG Abd 1 View Result Date: 03/30/2023 CLINICAL DATA:  Abdominal distension EXAM: ABDOMEN - 1 VIEW COMPARISON:  03/28/2023 FINDINGS: Scattered large and small bowel gas is noted. The degree of sigmoid gaseous dilatation is relatively stable. No obstructive changes are seen. No free air is noted. Mild retained fecal material is noted in the right colon. This is stable from the prior exam. IMPRESSION: Stable appearance when compare with the prior exam. Electronically Signed   By: Oneil Devonshire M.D.   On: 03/30/2023 20:04   DG Chest Port 1 View Result Date: 03/29/2023 CLINICAL DATA:  Dyspnea EXAM: PORTABLE CHEST - 1 VIEW COMPARISON:  03/23/2023 FINDINGS: Improved aeration. No focal infiltrate. Stable right IJ port catheter to the distal SVC. Heart size and mediastinal contours are within normal limits. Aortic Atherosclerosis (ICD10-170.0). No effusion. Cervical fixation hardware partially visualized. IMPRESSION: No acute cardiopulmonary disease. Electronically Signed   By: JONETTA Faes  M.D.   On: 03/29/2023 14:54   DG Abd 1 View Result Date: 03/28/2023 CLINICAL DATA:  Abdominal distension. EXAM: ABDOMEN - 1 VIEW COMPARISON:  CT abdomen pelvis dated 03/26/2023. FINDINGS: Evaluation is limited due to body habitus. Persistent air distention of the colon may represent colonic ileus. The sigmoid colon measures up to 13 cm in length which appears more distended since the CT. Moderate stool noted throughout the colon proximal to the distended segment as seen on the prior CT. No definite free air. No acute osseous pathology. IMPRESSION: Persistent, and slightly worsened, air distention of the colon may represent colonic ileus. Electronically Signed   By: Vanetta Chou M.D.   On: 03/28/2023 13:53   CT ABDOMEN PELVIS WO CONTRAST Result Date: 03/26/2023 CLINICAL DATA:  Abdominal pain. Prostate cancer with osseous metastasis. EXAM: CT ABDOMEN AND PELVIS WITHOUT CONTRAST TECHNIQUE: Multidetector CT imaging of the abdomen and pelvis was performed following the standard protocol without IV contrast. RADIATION DOSE REDUCTION: This exam was performed according to the departmental dose-optimization program which includes automated exposure control, adjustment of the mA and/or kV according to patient size and/or use of iterative reconstruction technique. COMPARISON:  CT chest abdomen pelvis dated 09/30/2022. FINDINGS: Evaluation of this exam is limited in the absence of intravenous contrast. Evaluation is also limited due to streak artifact caused by patient's arms and body habitus. Lower chest: The visualized lung bases are clear. The tip of a central venous line seen at the cavoatrial junction. No intra-abdominal free air or free fluid. Hepatobiliary: The liver is unremarkable. No biliary dilatation. Multiple stones in the gallbladder. No pericholecystic fluid or evidence of acute cholecystitis by CT. Pancreas: Unremarkable. No pancreatic ductal dilatation or surrounding inflammatory changes. Spleen:  Normal in size without focal abnormality. Adrenals/Urinary Tract: The adrenal glands are unremarkable. Similar appearance of left renal cyst and dilated upper pole collecting system, poorly evaluated on today's study. No stone. The right kidney is unremarkable. The visualized ureters and urinary bladder appear unremarkable. Stomach/Bowel: There is moderate stool throughout the colon. There is mild gaseous distension of the sigmoid colon. No evidence of obstruction. The appendix is not visualized with certainty. No inflammatory changes identified in the right lower quadrant. Vascular/Lymphatic: Mild aortoiliac atherosclerotic disease. The IVC is unremarkable. No portal venous gas. There is no adenopathy. Reproductive: The prostate and seminal vesicles are  grossly unremarkable. No pelvic mass Other: There is mild diffuse subcutaneous edema. Musculoskeletal: Extensive osseous sclerotic metastasis, relatively similar to prior CT. No acute osseous pathology. IMPRESSION: 1. No acute intra-abdominal or pelvic pathology. 2. Moderate colonic stool burden. No evidence of obstruction. 3. Cholelithiasis. 4. Extensive osseous sclerotic metastasis. 5.  Aortic Atherosclerosis (ICD10-I70.0). Electronically Signed   By: Vanetta Chou M.D.   On: 03/26/2023 17:02   ECHOCARDIOGRAM COMPLETE Result Date: 03/23/2023    ECHOCARDIOGRAM REPORT   Patient Name:   [name] Date of Exam: 03/23/2023 Medical Rec #:  [id number]    Height:       58.0 in Accession #:    [id number]   Weight:       300.0 lb Date of Birth:  1955-09-03    BSA:          2.164 m Patient Age:    67 years     BP:           141/82 mmHg Patient Gender: M            HR:           77 bpm. Exam Location:  [facility] Procedure: 2D Echo, Intracardiac Opacification Agent, Cardiac Doppler and Color            Doppler Indications:     Cardiomegaly I51.7  History:         Patient has no prior history of Echocardiogram examinations.                  Risk Factors:Hypertension.   Sonographer:     Bari Roar Referring Phys:  [id number] ELSPETH PARAS NEWTON Diagnosing Phys: Wilbert Bihari MD IMPRESSIONS  1. Left ventricular ejection fraction, by estimation, is 55 to 60%. The left ventricle has normal function. Left ventricular endocardial border not optimally defined to evaluate regional wall motion. Left ventricular diastolic parameters are indeterminate.  2. Right ventricular systolic function was not well visualized. The right ventricular size is normal.  3. The mitral valve is normal in structure. No evidence of mitral valve regurgitation. No evidence of mitral stenosis.  4. The aortic valve is normal in structure. Aortic valve regurgitation is not visualized. No aortic stenosis is present.  5. The inferior vena cava is normal in size with greater than 50% respiratory variability, suggesting right atrial pressure of 3 mmHg.  6. Ascending aorta measurements are within normal limits for age when indexed to body surface area. FINDINGS  Left Ventricle: Left ventricular ejection fraction, by estimation, is 55 to 60%. The left ventricle has normal function. Left ventricular endocardial border not optimally defined to evaluate regional wall motion. Definity  contrast agent was given IV to delineate the left ventricular endocardial Railey Glad. The left ventricular internal cavity size was normal in size. There is no left ventricular hypertrophy. Left ventricular diastolic parameters are indeterminate. Normal left ventricular filling pressure. Right Ventricle: The right ventricular size is normal. No increase in right ventricular wall thickness. Right ventricular systolic function was not well visualized. Left Atrium: Left atrial size was normal in size. Right Atrium: Right atrial size was normal in size. Pericardium: There is no evidence of pericardial effusion. Mitral Valve: The mitral valve is normal in structure. No evidence of mitral valve regurgitation. No evidence of mitral valve stenosis. MV peak  gradient, 5.2 mmHg. The mean mitral valve gradient is 3.0 mmHg. Tricuspid Valve: The tricuspid valve is normal in structure. Tricuspid valve regurgitation is not demonstrated. No evidence of tricuspid stenosis. Aortic Valve:  The aortic valve is normal in structure. Aortic valve regurgitation is not visualized. No aortic stenosis is present. Aortic valve mean gradient measures 5.0 mmHg. Aortic valve peak gradient measures 10.6 mmHg. Aortic valve area, by VTI measures 2.00 cm. Pulmonic Valve: The pulmonic valve was normal in structure. Pulmonic valve regurgitation is not visualized. No evidence of pulmonic stenosis. Aorta: The aortic root is normal in size and structure. Ascending aorta measurements are within normal limits for age when indexed to body surface area. Venous: The inferior vena cava is normal in size with greater than 50% respiratory variability, suggesting right atrial pressure of 3 mmHg. IAS/Shunts: No atrial level shunt detected by color flow Doppler.  LEFT VENTRICLE PLAX 2D LVIDd:         5.10 cm   Diastology LVIDs:         3.60 cm   LV e' medial:    7.18 cm/s LV PW:         1.00 cm   LV E/e' medial:  17.8 LV IVS:        1.10 cm   LV e' lateral:   13.50 cm/s LVOT diam:     2.20 cm   LV E/e' lateral: 9.5 LV SV:         63 LV SV Index:   29 LVOT Area:     3.80 cm  RIGHT VENTRICLE RV Basal diam:  3.80 cm RV Mid diam:    3.80 cm RV S prime:     16.60 cm/s LEFT ATRIUM           Index LA diam:      4.20 cm 1.94 cm/m LA Vol (A4C): 59.1 ml 27.32 ml/m  AORTIC VALVE                    PULMONIC VALVE AV Area (Vmax):    2.31 cm     PV Vmax:        1.32 m/s AV Area (Vmean):   2.50 cm     PV Peak grad:   7.0 mmHg AV Area (VTI):     2.00 cm     RVOT Peak grad: 5 mmHg AV Vmax:           163.00 cm/s AV Vmean:          98.200 cm/s AV VTI:            0.313 m AV Peak Grad:      10.6 mmHg AV Mean Grad:      5.0 mmHg LVOT Vmax:         99.00 cm/s LVOT Vmean:        64.600 cm/s LVOT VTI:          0.165 m LVOT/AV VTI  ratio: 0.53  AORTA Ao Root diam: 3.60 cm Ao Asc diam:  3.80 cm MITRAL VALVE MV Area (PHT): 4.74 cm     SHUNTS MV Area VTI:   2.31 cm     Systemic VTI:  0.16 m MV Peak grad:  5.2 mmHg     Systemic Diam: 2.20 cm MV Mean grad:  3.0 mmHg MV Vmax:       1.14 m/s MV Vmean:      90.2 cm/s MV Decel Time: 160 msec MV E velocity: 128.00 cm/s MV A velocity: 109.00 cm/s MV E/A ratio:  1.17 MV A Prime:    17.4 cm/s Wilbert Bihari MD Electronically signed by Wilbert Bihari MD Signature Date/Time: 03/23/2023/2:39:36 PM  Final    CT Angio Chest Pulmonary Embolism (PE) W or WO Contrast Result Date: 03/23/2023 CLINICAL DATA:  Recent left leg DVT. History of PE. Patient complains of swelling the arms and legs. EXAM: CT ANGIOGRAPHY CHEST WITH CONTRAST TECHNIQUE: Multidetector CT imaging of the chest was performed using the standard protocol during bolus administration of intravenous contrast. Multiplanar CT image reconstructions and MIPs were obtained to evaluate the vascular anatomy. RADIATION DOSE REDUCTION: This exam was performed according to the departmental dose-optimization program which includes automated exposure control, adjustment of the mA and/or kV according to patient size and/or use of iterative reconstruction technique. CONTRAST:  80mL OMNIPAQUE  IOHEXOL  350 MG/ML SOLN COMPARISON:  03/06/2023. FINDINGS: Cardiovascular: Central pulmonary arteries are well opacified. There is relative decreased opacification of the segmental and smaller pulmonary emboli mildly limiting the exam. Allowing for this, there is no evidence of a pulmonary embolism. Heart is normal in size. No pericardial effusion. Mild left coronary artery calcifications. 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 No aortic dissection. Minor aortic atherosclerosis. Arch branch vessels are widely patent. Mediastinum/Nodes: No neck base, mediastinal or hilar masses. No enlarged lymph nodes. Trachea and esophagus are unremarkable. Lungs/Pleura: Lung volumes are  low. Mild interstitial prominence in the lower lungs. Small area of peripheral reticulation in the peripheral right lower lobe. Mild areas mosaic perfusion suspected to be air trapping. No convincing pneumonia and no pulmonary edema. No pleural effusion or pneumothorax. Upper Abdomen: No acute findings.  Multiple gallstones. Musculoskeletal: No fracture or acute finding. Numerous sclerotic bone lesions consistent with metastatic prostate carcinoma. This is similar to the prior CT. Review of the MIP images confirms the above findings. IMPRESSION: 1. No evidence a pulmonary embolism. 2. No acute findings. 3. 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 Recommend semi-annual imaging followup by CTA or MRA and referral to cardiothoracic surgery if not already obtained. This recommendation follows 2010 ACCF/AHA/AATS/ACR/ASA/SCA/SCAI/SIR/STS/SVM Guidelines for the Diagnosis and Management of Patients With Thoracic Aortic Disease. Circulation. 2010; 121: [id number]. Aortic aneurysm NOS (ICD10-I71.9) 4. Minor aortic atherosclerosis mild left coronary artery calcifications. 5. Stable sclerotic bone lesions consistent with metastatic prostate carcinoma. Aortic Atherosclerosis (ICD10-I70.0). Electronically Signed   By: Alm Parkins M.D.   On: 03/23/2023 09:34   DG Chest 2 View Result Date: 03/23/2023 CLINICAL DATA:  68 year old male with history of dyspnea. Swelling in bilateral legs. EXAM: CHEST - 2 VIEW COMPARISON:  Chest x-ray 03/06/2023. FINDINGS: Right internal jugular single-lumen power porta cath with tip terminating in the distal superior vena cava. Lung volumes are very low and film is under penetrated limiting the diagnostic sensitivity and specificity of the examination. With these limitations in mind, bibasilar opacities likely reflect subsegmental atelectasis. No definite consolidative airspace disease. No definite pleural effusions. No pneumothorax. Cephalization of the pulmonary vasculature, without frank  pulmonary edema. Mild cardiomegaly. Upper mediastinal contours are within normal limits. Orthopedic fixation hardware in the lower cervical spine incidentally noted. IMPRESSION: 1. Low lung volumes with bibasilar subsegmental atelectasis. 2. Mild cardiomegaly with pulmonary venous congestion, but no frank pulmonary edema. Electronically Signed   By: Toribio Aye M.D.   On: 03/23/2023 05:42   US  Venous Img Lower Bilateral (DVT) Result Date: 03/23/2023 CLINICAL DATA:  Left leg swelling, bilateral above knee amputation EXAM: BILATERAL LOWER EXTREMITY VENOUS DOPPLER ULTRASOUND TECHNIQUE: Gray-scale sonography with graded compression, as well as color Doppler and duplex ultrasound were performed to evaluate the lower extremity deep venous systems from the level of the common femoral vein and including  the common femoral, femoral, profunda femoral, popliteal and calf veins including the posterior tibial, peroneal and gastrocnemius veins when visible. The superficial great saphenous vein was also interrogated. Spectral Doppler was utilized to evaluate flow at rest and with distal augmentation maneuvers in the common femoral, femoral and popliteal veins. COMPARISON:  03/07/2023 FINDINGS: RIGHT LOWER EXTREMITY Common Femoral Vein: No evidence of thrombus. Normal compressibility, respiratory phasicity and response to augmentation. Saphenofemoral Junction: No evidence of thrombus. Normal compressibility and flow on color Doppler imaging. Profunda Femoral Vein: No evidence of thrombus. Normal compressibility and flow on color Doppler imaging. Femoral Vein: Limited evaluation due to patient's body habitus. Interval development of occlusive thrombus within the visualized proximal segment. The distal segment is not well visualized. Popliteal Vein: Absent Calf Veins: Absent Superficial Great Saphenous Vein: Not well visualized Venous Reflux:  Not assessed Other Findings:  None. LEFT LOWER EXTREMITY Common Femoral Vein: No  evidence of thrombus. Normal compressibility, respiratory phasicity and response to augmentation. Saphenofemoral Junction: No evidence of thrombus. Normal compressibility and flow on color Doppler imaging. Profunda Femoral Vein: Occlusive thrombus within the visualized segment, similar prior examination Femoral Vein: Limited visualization due to the patient's body habitus. Persistent occlusive thrombus within the visualized proximal and the mid segment. Popliteal Vein: Absent Calf Veins: Absent Superficial Great Saphenous Vein: Not well assessed on this examination Venous Reflux:  Not assessed Other Findings:  None. IMPRESSION: 1. Interval development of occlusive thrombus within the visualized proximal segment of the right femoral vein. 2. Persistent occlusive thrombus within the visualized segment of the left profunda femoral vein and proximal and mid segment of the left femoral vein. Electronically Signed   By: Dorethia Molt M.D.   On: 03/23/2023 04:50    PERFORMANCE STATUS (ECOG) : 3 - Symptomatic, >50% confined to bed  Review of Systems Unless otherwise noted, a complete review of systems is negative.  Physical Exam General: NAD Pulmonary: Exertionally labored, on O2 Extremities: edema, B AKA Skin: no rashes Neurological: Weakness but otherwise nonfocal  IMPRESSION: Patient is well-known to me from the clinic.  He was seen today in the emergency department.  Patient has had several recent hospitalizations, primarily for recurrent DVT and now with sepsis/PNA/MRSA bacteremia.  Today, patient endorses poorly controlled back pain.  He has Percocet ordered but has not received any today.  Will have nurse give him a dose and liberalize frequency.  Note recent colonic ileus and patient will need an aggressive bowel regimen to prevent opioid-induced constipation.  At baseline, patient lives at home alone.  He has support of some close friends who are involved with his care and see him regularly.   However, his daughter lives in [place] .  Patient asked about hospice involvement as a way to try to obtain more help at home.  Discussed hospice in detail today.  Patient says he was not aware that hospice would necessitate transitioning off cancer treatment.  He says that he is not ready to forego cancer treatment at this point.  Will have TOC discuss alternative home care resources.  Patient says he is in the process of completing advance directives.  He wants his pastor - Todd Forte to be his HCPOA. I spoke with the chaplain to see if we can get these documents completed while he is hospitalized.   We also discussed CODE STATUS.  Patient has previously desired to remain a full code.  However, today he says that he is not interested in CPR or intubation.  He tells me  that his quality of life has already become poor and that he would not want aggressive care at end-of-life.  I also spoke with Todd Forte by phone. He confirmed DNR/DNI.   PLAN: -Continue current scope of treatment -Chaplain to assist with ACP documents -DNR/DNI -Liberalize Percocet as needed for pain -Daily bowel regimen -TOC to assist with home resources  Case and plan discussed with Dr. Rennie   Time Total: 50 minutes  Visit consisted of counseling and education dealing with the complex and emotionally intense issues of symptom management and palliative care in the setting of serious and potentially life-threatening illness.Greater than 50%  of this time was spent counseling and coordinating care related to the above assessment and plan.  Signed by: Fonda Mower, PhD, NP-C

## 2023-04-13 ENCOUNTER — Inpatient Hospital Stay: Payer: Medicare PPO

## 2023-04-13 DIAGNOSIS — Y95 Nosocomial condition: Secondary | ICD-10-CM | POA: Diagnosis not present

## 2023-04-13 DIAGNOSIS — J189 Pneumonia, unspecified organism: Secondary | ICD-10-CM | POA: Diagnosis not present

## 2023-04-13 LAB — CBC WITH DIFFERENTIAL/PLATELET
Abs Immature Granulocytes: 0.21 10*3/uL — ABNORMAL HIGH (ref 0.00–0.07)
Basophils Absolute: 0.1 10*3/uL (ref 0.0–0.1)
Basophils Relative: 0 %
Eosinophils Absolute: 0.1 10*3/uL (ref 0.0–0.5)
Eosinophils Relative: 0 %
HCT: 33.2 % — ABNORMAL LOW (ref 39.0–52.0)
Hemoglobin: 10.8 g/dL — ABNORMAL LOW (ref 13.0–17.0)
Immature Granulocytes: 1 %
Lymphocytes Relative: 2 %
Lymphs Abs: 0.3 10*3/uL — ABNORMAL LOW (ref 0.7–4.0)
MCH: 31.2 pg (ref 26.0–34.0)
MCHC: 32.5 g/dL (ref 30.0–36.0)
MCV: 96 fL (ref 80.0–100.0)
Monocytes Absolute: 1.2 10*3/uL — ABNORMAL HIGH (ref 0.1–1.0)
Monocytes Relative: 7 %
Neutro Abs: 15 10*3/uL — ABNORMAL HIGH (ref 1.7–7.7)
Neutrophils Relative %: 90 %
Platelets: 333 10*3/uL (ref 150–400)
RBC: 3.46 MIL/uL — ABNORMAL LOW (ref 4.22–5.81)
RDW: 14.8 % (ref 11.5–15.5)
WBC: 16.9 10*3/uL — ABNORMAL HIGH (ref 4.0–10.5)
nRBC: 0 % (ref 0.0–0.2)

## 2023-04-13 LAB — COMPREHENSIVE METABOLIC PANEL
ALT: 27 U/L (ref 0–44)
AST: 34 U/L (ref 15–41)
Albumin: 3.3 g/dL — ABNORMAL LOW (ref 3.5–5.0)
Alkaline Phosphatase: 65 U/L (ref 38–126)
Anion gap: 12 (ref 5–15)
BUN: 57 mg/dL — ABNORMAL HIGH (ref 8–23)
CO2: 22 mmol/L (ref 22–32)
Calcium: 9.9 mg/dL (ref 8.9–10.3)
Chloride: 100 mmol/L (ref 98–111)
Creatinine, Ser: 2.35 mg/dL — ABNORMAL HIGH (ref 0.61–1.24)
GFR, Estimated: 30 mL/min — ABNORMAL LOW (ref >60)
Glucose, Bld: 125 mg/dL — ABNORMAL HIGH (ref 70–99)
Potassium: 3.2 mmol/L — ABNORMAL LOW (ref 3.5–5.1)
Sodium: 134 mmol/L — ABNORMAL LOW (ref 135–145)
Total Bilirubin: 1.1 mg/dL (ref 0.0–1.2)
Total Protein: 6.7 g/dL (ref 6.5–8.1)

## 2023-04-13 LAB — MAGNESIUM: Magnesium: 1.9 mg/dL (ref 1.7–2.4)

## 2023-04-13 LAB — PHOSPHORUS: Phosphorus: 3.4 mg/dL (ref 2.5–4.6)

## 2023-04-13 LAB — PROTIME-INR
INR: 2.8 — ABNORMAL HIGH (ref 0.8–1.2)
Prothrombin Time: 29.3 s — ABNORMAL HIGH (ref 11.4–15.2)

## 2023-04-13 MED ORDER — VANCOMYCIN VARIABLE DOSE PER UNSTABLE RENAL FUNCTION (PHARMACIST DOSING)
Status: DC
Start: 2023-04-13 — End: 2023-04-15

## 2023-04-13 MED ORDER — POTASSIUM CHLORIDE CRYS ER 20 MEQ PO TBCR
40.0000 meq | EXTENDED_RELEASE_TABLET | Freq: Once | ORAL | Status: AC
  Administered 2023-04-13: 40 meq via ORAL
  Filled 2023-04-13: qty 2

## 2023-04-13 MED ORDER — GABAPENTIN 100 MG PO CAPS
200.0000 mg | ORAL_CAPSULE | Freq: Three times a day (TID) | ORAL | Status: DC
  Administered 2023-04-13 – 2023-04-27 (×42): 200 mg via ORAL
  Filled 2023-04-13 (×43): qty 2

## 2023-04-13 MED ORDER — LACTULOSE 10 GM/15ML PO SOLN
10.0000 g | Freq: Three times a day (TID) | ORAL | Status: DC
  Administered 2023-04-13 – 2023-04-20 (×10): 10 g via ORAL
  Filled 2023-04-13 (×15): qty 30

## 2023-04-13 MED ORDER — HALOPERIDOL LACTATE 5 MG/ML IJ SOLN
1.0000 mg | Freq: Once | INTRAMUSCULAR | Status: AC
  Administered 2023-04-13: 1 mg via INTRAVENOUS
  Filled 2023-04-13: qty 1

## 2023-04-13 MED ORDER — FUROSEMIDE 10 MG/ML IJ SOLN
40.0000 mg | Freq: Two times a day (BID) | INTRAMUSCULAR | Status: DC
  Administered 2023-04-13 – 2023-04-16 (×6): 40 mg via INTRAVENOUS
  Filled 2023-04-13 (×6): qty 4

## 2023-04-13 NOTE — Progress Notes (Signed)
 Pharmacy Antibiotic Note  [name] is a 68 y.o. male admitted on 04/09/2023 with MRSA bacteremia.  Pharmacy has been consulted for Vancomycin  dosing.  Patient with recent admission for recurrent DVT, discharged 1/31.  Comes to ED 2/4 with confusion and shortness of breath.  CT chest/abdomen/pelvis lung findings concerning for septic pulmonary emboli.  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   Today, 04/13/2023 Day #4 vancomycin  Renal: AKI - SCr increase 1.29 >> 2.35  Afebrile/24h WBC 12.4 >> 16.9 2/4 blood culture: 3/4 bottles MRSA 2/5 Urine cx: NG Respiratory panel and respiratory virus panel neg 2/5 C difficile neg Rule out endocarditis with concern for septic pulmonary embolism on CT 2/5 ECHO: no endocarditis, TEE pending, PORT removal pending d/t elevated INR  2/7 repeat blood cultures: NG<24 hrs  Vancomycin  dosing/levels: Vancomycin  doses given 2/4 1gm IV x 1 at 2356 then 2/5 1500mg  IV  x1 2/6 random vancomycin  level at 0603 = 11 mcg/mL 2/6 vancomycin  1500mg  IV x 1 given at 0947, random vancomycin  level 2/7 at 0553 = 16 mcg/mL  Plan: Scr increase 1.29 >> 2.35 Last vancomycin  dose of 1500 mg given 2/8 @0836  AKI: Will change back to dosing per Vancomycin  levels. Will order Vancomycin  random level 2/9 am Note history of infusion related reaction with vancomycin  - slow infusion Anticipate  some difficulty dosing vancomycin  with PMH Bilateral AKA - effect on volume of distribution and SCr (loss of muscle mass).   Follow renal function closely daily CMP ordered until 2/12 Follow current cultures, ECHO, repeat blood cultures  Daptomycin- not currently option with septic PE, Pneumonia Await PORT removal (awaiting INR to come down) ID consult  Temp (24hrs), Avg:98.1 F (36.7 C), Min:97.7 F (36.5 C), Max:98.3 F (36.8  C)   Recent Labs  Lab 04/09/23 2025 04/09/23 2132 04/10/23 0127 04/10/23 0350 04/10/23 1051 04/11/23 0603 04/12/23 0550 04/12/23 0553 04/12/23 0743 04/13/23 0322  WBC 20.0*  --   --   --  16.3*  --   --   --  12.4* 16.9*  CREATININE 3.89*  --   --   --  3.11* 2.10* 1.35*  --  1.29* 2.35*  LATICACIDVEN  --  1.6 1.5 1.5  --   --   --   --   --   --   VANCORANDOM  --   --   --   --   --  11  --  16  --   --     Estimated Creatinine Clearance: 35.2 mL/min (A) (by C-G formula based on SCr of 2.35 mg/dL (H)).    Allergies  Allergen Reactions   Bacitracin Hives   Vancomycin  Other (See Comments) and Rash    Red man syndrome.   Ketorolac     Other Reaction(s): Kidney Disorder  Other reaction(s): Kidney Disorder   Spironolactone     Other Reaction(s): Unknown  Other reaction(s): Unknown    Antimicrobials this admission: 2/4 Cefepime  >> x 1 dose 2/4 Vancomycin  >>    Thank you for allowing pharmacy to be a part of this patient's care.  Allean Haas PharmD Clinical Pharmacist 04/13/2023

## 2023-04-13 NOTE — Progress Notes (Addendum)
 PROGRESS NOTE    [name]  FMW:[id number] DOB: 1955-03-10 DOA: 04/09/2023 PCP: Eliverto Bette Hover, MD  Chief Complaint  Patient presents with   Shortness of Breath    Hospital Course:  [name] is 68 y.o. male with stage IV prostate cancer, recurrent VTE, hypertension, PVD status post AKA, decubitus ulcers, who was admitted on 1/18 with shortness of breath and was found to have recurrent DVT despite Eliquis  and then Coumadin  (not technically a failure as he was not bridged.)  He had been unable to afford Lovenox  as an outpatient.  During hospitalization he was noted to have volume overload and was diuresed.  Stay was also complicated by ileus for stool impaction that was treated aggressively with a bowel regimen.  He was discharged in stable condition on 1/31.  He returned to the ED on 2/4 due to increasing confusion and shortness of breath.  He was found to be saturating 89% on room air.  EMS placed patient on CPAP prior to arrival.  In the ED he was found to be septic with fever of 101.9, respiratory rate 31, leukocytosis 20K, and hypoxia.  Chest x-ray revealed left basilar opacity.  He was placed on BiPAP due to work of breathing and hypoxemia.  He failed weaning trial due to work of breathing though he did have good oxygen saturations.  He was admitted for management of healthcare associated pneumonia and sepsis.  Subjective: Increased work of breathing overnight requiring high flow nasal cannula. Continues to complain of back pain and SOB.  Was retaining 500cc urine this  AM, foley placed.  Objective: Vitals:   04/12/23 2046 04/12/23 2352 04/13/23 0420 04/13/23 0620  BP: (!) 149/81 (!) 96/56    Pulse: 95 95    Resp:  17    Temp:  98 F (36.7 C)    TempSrc:  Oral    SpO2:  96% 95% 96%  Weight:      Height:        Intake/Output Summary (Last 24 hours) at 04/13/2023 0954 Last data filed at 04/13/2023 0300 Gross per 24 hour  Intake 290.03 ml  Output --  Net  290.03 ml   Filed Weights   04/09/23 2019  Weight: 136 kg    Examination: General exam: Appears calm, some distress Respiratory system: Shortened expiratory phase, end exp. Wheezing, crackles bilateral lung bases, shallow respirations Cardiovascular system: S1 & S2 heard, RRR.  Gastrointestinal system: Abdomen is distended, bilateraly flank tenderness  Neuro: Alert and oriented. No focal neurological deficits. Extremities: Bilateral AKA  Skin: No rashes, lesions Psychiatry: Demonstrates appropriate judgement and insight. Mood & affect appropriate for situation.   Assessment & Plan:  Principal Problem:   HAP (hospital-acquired pneumonia) Active Problems:   Severe sepsis (HCC)   PE (pulmonary thromboembolism) (HCC)   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   Ogilvie syndrome   AKI (acute kidney injury) (HCC)   Prostate cancer metastatic to bone [facility])   Essential hypertension   MRSA bacteremia   Pulmonary infiltrates   Septic embolism (HCC)   Palliative care encounter    Severe sepsis - Criteria met with hypoxemia, fever, AKI, leukocytosis, tachypnea.  Source: Pneumonia and bacteremia - Antibiotics as below  MRSA bacteremia - Patient is on vancomycin  - ID consulted - He does have a port in place and there are multiple nodules in the lungs concerning for septic emboli - Echocardiogram without evidence of endocarditis, needs a TEE but respiratory status is unlikely  to tolerate that at this time. - For now we will remove the port and send tip for culture (IR consulted) - Port removal has been complicated by supratherapeutic INR.  Status post low-dose vitamin K  and INR is now <3, reengage IR. Hopefully this can be done tomorrow.  Healthcare associated pneumonia +/- septic emboli Hypoxic respiratory failure - Respiratory status is worsening today, repeat chest x-ray with worsening infiltrates and effusions -- Will trial 40mg  IVP BID lasix , this may worsen Cr.   -Still with wheezing on exam, continue prednisone  - Procalcitonin: 5.8 - Leukocytosis is rising but complicated by steroid use.  Pain - Suspect this is secondary to his multiple osseous lesions.  Continue with as needed Dilaudid  and Percocet.  Scheduled Tylenol  and lidocaine   AKI - Baseline creatinine 0.98, creatinine 3.89 on arrival - Creatinine had improved some but appears to be worsening again. Expect further worsening with lasix , but respiratory status necessitates diuresis  - Status post IVF. -- Strict Ins and Outs. Consult renal if UOP declining  History of Ogilivie syndrome - Has had bowel movements this admission, none today. continue TID lactulose   Pressure ulcers of skin - Last noted on ischial tuberosity and left buttock - Continue with Medihoney to ulcers - Wound nurse consult  Pulmonary embolism Supratherapeutic INR - Has had recurrent VTEs on Eliquis , now on warfarin therapy --  Received low-dose vitamin K   2/7.  Avoid high-dose vitamin K  as he is very high risk for coagulopathy given his sepsis, malignancy, and history of recurrent VTE -- INR improved today -- Pharmacy to manage Warfarin dosing - Continue warfarin with pharmacy consult for dosing  Hypertension - Continue home meds  Prostate cancer metastatic to bone - Follows with oncology outpatient, currently holding treatment in setting of sepsis - Port removal as above - Palliative consult - Patient was on hospice outpatient but he misunderstood the services.  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 PT/Ot consulted  DVT prophylaxis: Warfarin   Code Status: Limited: Do not attempt resuscitation (DNR) -DNR-LIMITED -Do Not Intubate/DNI  Family Communication: discussed directly  with patient Disposition:  Status is: Inpatient, workup ongoing.  Currently necessitates IV antibiotics. Further work up. Likely eventual DC to SNF    Consultants:  ID  Procedures:    Antimicrobials:  Anti-infectives (From admission, onward)    Start     Dose/Rate Route Frequency Ordered Stop   04/12/23 1000  vancomycin  (VANCOREADY) IVPB 1500 mg/300 mL        1,500 mg 100 mL/hr over 180 Minutes Intravenous Every 24 hours 04/12/23 0831     04/11/23 1000  vancomycin  (VANCOREADY) IVPB 1500 mg/300 mL        1,500 mg 75 mL/hr over 240 Minutes Intravenous  Once 04/11/23 0828 04/11/23 1358   04/10/23 0308  vancomycin  variable dose per unstable renal function (pharmacist dosing)  Status:  Discontinued         Does not apply See admin instructions 04/10/23 0308 04/12/23 0831   04/10/23 0145  vancomycin  (VANCOREADY) IVPB 1500 mg/300 mL        1,500 mg 100 mL/hr over 180 Minutes Intravenous  Once 04/10/23 0131 04/10/23 0546   04/10/23 0130  vancomycin  (VANCOCIN ) IVPB 1000 mg/200 mL premix  Status:  Discontinued        1,000 mg 200 mL/hr over 60 Minutes Intravenous  Once 04/10/23 0120 04/10/23 0131   04/09/23 2100  vancomycin  (VANCOCIN ) IVPB 1000 mg/200 mL premix        1,000 mg 200 mL/hr over 60 Minutes Intravenous  Once 04/09/23 2047 04/10/23 0113   04/09/23 2100  ceFEPIme  (MAXIPIME ) 2 g in sodium chloride  0.9 % 100 mL IVPB        2 g 200 mL/hr over 30 Minutes Intravenous  Once 04/09/23 2047 04/10/23 0113       Data Reviewed: I have personally reviewed following labs and imaging studies CBC: Recent Labs  Lab 04/09/23 2025 04/10/23 1051 04/12/23 0743 04/13/23 0322  WBC 20.0* 16.3* 12.4* 16.9*  NEUTROABS 18.2* 15.7* 11.4* 15.0*  HGB 10.9* 10.8* 10.6* 10.8*  HCT 31.4* 31.4* 31.7* 33.2*  MCV 92.1 91.3 92.4 96.0  PLT 299 229 274 333   Basic Metabolic Panel: Recent Labs  Lab 04/09/23 2025 04/10/23 1051 04/11/23 0603 04/12/23 0550 04/12/23 0743 04/13/23 0322  NA 125* 129*  133*  --  136 134*  K 3.3* 3.2* 2.8*  --  2.8* 3.2*  CL 90* 96* 100  --  104 100  CO2 21* 20* 19*  --  22 22  GLUCOSE 117* 171* 167*  --  139* 125*  BUN 57* 63* 61*  --  54* 57*  CREATININE 3.89* 3.11* 2.10* 1.35* 1.29* 2.35*  CALCIUM 9.2 9.5 9.8  --  10.2 9.9  MG  --   --   --   --  1.9 1.9  PHOS  --   --   --   --  2.1* 3.4   GFR: Estimated Creatinine Clearance: 35.2 mL/min (A) (by C-G formula based on SCr of 2.35 mg/dL (H)). Liver Function Tests: Recent Labs  Lab 04/09/23 2025 04/10/23 1051 04/12/23 0743 04/13/23 0322  AST 48* 44* 25 34  ALT 22 22 25 27   ALKPHOS 59 67 58 65  BILITOT 2.6* 2.1* 0.6 1.1  PROT 7.2 6.9 6.8 6.7  ALBUMIN  3.8 3.3* 3.1* 3.3*   CBG: No results for input(s): GLUCAP in the last 168 hours.   Recent Results (from the past 240 hours)  Resp panel by RT-PCR (RSV, Flu A&B, Covid) Anterior Nasal Swab     Status: None   Collection Time: 04/09/23  8:19 PM   Specimen: Anterior Nasal Swab  Result Value Ref Range Status   SARS Coronavirus 2 by RT PCR NEGATIVE NEGATIVE Final    Comment: (NOTE) SARS-CoV-2 target nucleic acids are NOT DETECTED.  The SARS-CoV-2 RNA is generally detectable in upper respiratory specimens during the acute phase of infection. The lowest concentration of SARS-CoV-2 viral copies this assay can detect is 138 copies/mL. A negative result does not preclude SARS-Cov-2 infection and should not be used as the sole basis for treatment or other patient management decisions. A negative result may occur with  improper specimen collection/handling, submission of specimen other than nasopharyngeal swab, presence of viral mutation(s) within the areas targeted by this assay, and inadequate number of viral copies(<138 copies/mL). A negative result must be combined with clinical observations, patient history, and epidemiological information. The expected result is Negative.  Fact Sheet for Patients:   bloggercourse.com  Fact Sheet for Healthcare Providers:  seriousbroker.it  This test is no t yet approved or cleared by the [place]  FDA and  has been authorized for detection and/or diagnosis of SARS-CoV-2 by FDA under an Emergency  Use Authorization (EUA). This EUA will remain  in effect (meaning this test can be used) for the duration of the COVID-19 declaration under Section 564(b)(1) of the Act, 21 U.S.C.section 360bbb-3(b)(1), unless the authorization is terminated  or revoked sooner.       Influenza A by PCR NEGATIVE NEGATIVE Final   Influenza B by PCR NEGATIVE NEGATIVE Final    Comment: (NOTE) The Xpert Xpress SARS-CoV-2/FLU/RSV plus assay is intended as an aid in the diagnosis of influenza from Nasopharyngeal swab specimens and should not be used as a sole basis for treatment. Nasal washings and aspirates are unacceptable for Xpert Xpress SARS-CoV-2/FLU/RSV testing.  Fact Sheet for Patients: bloggercourse.com  Fact Sheet for Healthcare Providers: seriousbroker.it  This test is not yet approved or cleared by the [place]  FDA and has been authorized for detection and/or diagnosis of SARS-CoV-2 by FDA under an Emergency Use Authorization (EUA). This EUA will remain in effect (meaning this test can be used) for the duration of the COVID-19 declaration under Section 564(b)(1) of the Act, 21 U.S.C. section 360bbb-3(b)(1), unless the authorization is terminated or revoked.     Resp Syncytial Virus by PCR NEGATIVE NEGATIVE Final    Comment: (NOTE) Fact Sheet for Patients: bloggercourse.com  Fact Sheet for Healthcare Providers: seriousbroker.it  This test is not yet approved or cleared by the [place]  FDA and has been authorized for detection and/or diagnosis of SARS-CoV-2 by FDA under an Emergency Use  Authorization (EUA). This EUA will remain in effect (meaning this test can be used) for the duration of the COVID-19 declaration under Section 564(b)(1) of the Act, 21 U.S.C. section 360bbb-3(b)(1), unless the authorization is terminated or revoked.  Performed at [facility], [address] [place], [place] [address]   Blood Culture (routine x 2)     Status: Abnormal (Preliminary result)   Collection Time: 04/09/23  8:19 PM   Specimen: Right Antecubital; Blood  Result Value Ref Range Status   Specimen Description   Final    RIGHT ANTECUBITAL BLOOD Performed at [facility], [address] [place], [place] [address]    Special Requests   Final    BOTTLES DRAWN AEROBIC AND ANAEROBIC Blood Culture results may not be optimal due to an inadequate volume of blood received in culture bottles Performed at [facility], [address] [place], [place] [address]    Culture  Setup Time   Final    GRAM POSITIVE COCCI IN BOTH AEROBIC AND ANAEROBIC BOTTLES CRITICAL RESULT CALLED TO, READ BACK BY AND VERIFIED WITH: TREY GREENWOOD PHARMD 1137 04/10/23 HNM GRAM STAIN REVIEWED-AGREE WITH RESULT DRT    Culture (A)  Final    METHICILLIN RESISTANT STAPHYLOCOCCUS AUREUS Sent to [facility] for further susceptibility testing. Performed at [facility], [address], [place], [place] [address]    Report Status PENDING  Incomplete   Organism ID, Bacteria METHICILLIN RESISTANT STAPHYLOCOCCUS AUREUS  Final      Susceptibility   Methicillin resistant staphylococcus aureus - MIC*    CIPROFLOXACIN >=8 RESISTANT Resistant     ERYTHROMYCIN >=8 RESISTANT Resistant     GENTAMICIN <=0.5 SENSITIVE Sensitive     OXACILLIN >=4 RESISTANT Resistant     TETRACYCLINE <=1 SENSITIVE Sensitive     VANCOMYCIN  1 SENSITIVE Sensitive     TRIMETH /SULFA  <=10 SENSITIVE Sensitive     CLINDAMYCIN <=0.25 SENSITIVE Sensitive     RIFAMPIN <=0.5 SENSITIVE Sensitive     Inducible Clindamycin  NEGATIVE Sensitive  LINEZOLID  2 SENSITIVE Sensitive     * METHICILLIN RESISTANT STAPHYLOCOCCUS AUREUS  Blood Culture (routine x 2)     Status: Abnormal   Collection Time: 04/09/23  8:19 PM   Specimen: BLOOD RIGHT FOREARM  Result Value Ref Range Status   Specimen Description   Final    BLOOD RIGHT FOREARM Performed at [facility], [address] [place], [place] [address]    Special Requests   Final    BOTTLES DRAWN AEROBIC AND ANAEROBIC Blood Culture results may not be optimal due to an inadequate volume of blood received in culture bottles Performed at [facility], [address] [place], [place] [address]    Culture  Setup Time   Final    GRAM POSITIVE COCCI AEROBIC BOTTLE ONLY CRITICAL VALUE NOTED.  VALUE IS CONSISTENT WITH PREVIOUSLY REPORTED AND CALLED VALUE. GRAM STAIN REVIEWED-AGREE WITH RESULT DRT    Culture (A)  Final    STAPHYLOCOCCUS AUREUS SUSCEPTIBILITIES PERFORMED ON PREVIOUS CULTURE WITHIN THE LAST 5 DAYS. Performed at [facility], [address], [place], [place] [address]    Report Status 04/12/2023 FINAL  Final  Blood Culture ID Panel (Reflexed)     Status: Abnormal   Collection Time: 04/09/23  8:19 PM  Result Value Ref Range Status   Enterococcus faecalis NOT DETECTED NOT DETECTED Final   Enterococcus Faecium NOT DETECTED NOT DETECTED Final   Listeria monocytogenes NOT DETECTED NOT DETECTED Final   Staphylococcus species DETECTED (A) NOT DETECTED Final    Comment: CRITICAL RESULT CALLED TO, READ BACK BY AND VERIFIED WITH: MOSE BLEW PHARMD 1137 04/10/23 HNM    Staphylococcus aureus (BCID) DETECTED (A) NOT DETECTED Final    Comment: Methicillin (oxacillin)-resistant Staphylococcus aureus (MRSA). MRSA is predictably resistant to beta-lactam antibiotics (except ceftaroline). Preferred therapy is vancomycin  unless clinically contraindicated. Patient requires contact precautions if  hospitalized. CRITICAL RESULT CALLED TO, READ  BACK BY AND VERIFIED WITH: MOSE BLEW PHARMD 1137 04/10/23 HNM    Staphylococcus epidermidis NOT DETECTED NOT DETECTED Final   Staphylococcus lugdunensis NOT DETECTED NOT DETECTED Final   Streptococcus species NOT DETECTED NOT DETECTED Final   Streptococcus agalactiae NOT DETECTED NOT DETECTED Final   Streptococcus pneumoniae NOT DETECTED NOT DETECTED Final   Streptococcus pyogenes NOT DETECTED NOT DETECTED Final   A.calcoaceticus-baumannii NOT DETECTED NOT DETECTED Final   Bacteroides fragilis NOT DETECTED NOT DETECTED Final   Enterobacterales NOT DETECTED NOT DETECTED Final   Enterobacter cloacae complex NOT DETECTED NOT DETECTED Final   Escherichia coli NOT DETECTED NOT DETECTED Final   Klebsiella aerogenes NOT DETECTED NOT DETECTED Final   Klebsiella oxytoca NOT DETECTED NOT DETECTED Final   Klebsiella pneumoniae NOT DETECTED NOT DETECTED Final   Proteus species NOT DETECTED NOT DETECTED Final   Salmonella species NOT DETECTED NOT DETECTED Final   Serratia marcescens NOT DETECTED NOT DETECTED Final   Haemophilus influenzae NOT DETECTED NOT DETECTED Final   Neisseria meningitidis NOT DETECTED NOT DETECTED Final   Pseudomonas aeruginosa NOT DETECTED NOT DETECTED Final   Stenotrophomonas maltophilia NOT DETECTED NOT DETECTED Final   Candida albicans NOT DETECTED NOT DETECTED Final   Candida auris NOT DETECTED NOT DETECTED Final   Candida glabrata NOT DETECTED NOT DETECTED Final   Candida krusei NOT DETECTED NOT DETECTED Final   Candida parapsilosis NOT DETECTED NOT DETECTED Final   Candida tropicalis NOT DETECTED NOT DETECTED Final   Cryptococcus neoformans/gattii NOT DETECTED NOT DETECTED Final   Meth resistant mecA/C and MREJ DETECTED (  A) NOT DETECTED Final    Comment: CRITICAL RESULT CALLED TO, READ BACK BY AND VERIFIED WITH: MOSE BLEW PHARMD 1137 04/10/23 HNM Performed at [facility], [address] [place], [place] [address]   Respiratory (~20 pathogens)  panel by PCR     Status: None   Collection Time: 04/10/23  1:27 AM   Specimen: Nasopharyngeal Swab; Respiratory  Result Value Ref Range Status   Adenovirus NOT DETECTED NOT DETECTED Final   Coronavirus 229E NOT DETECTED NOT DETECTED Final    Comment: (NOTE) The Coronavirus on the Respiratory Panel, DOES NOT test for the novel  Coronavirus (2019 nCoV)    Coronavirus HKU1 NOT DETECTED NOT DETECTED Final   Coronavirus NL63 NOT DETECTED NOT DETECTED Final   Coronavirus OC43 NOT DETECTED NOT DETECTED Final   Metapneumovirus NOT DETECTED NOT DETECTED Final   Rhinovirus / Enterovirus NOT DETECTED NOT DETECTED Final   Influenza A NOT DETECTED NOT DETECTED Final   Influenza B NOT DETECTED NOT DETECTED Final   Parainfluenza Virus 1 NOT DETECTED NOT DETECTED Final   Parainfluenza Virus 2 NOT DETECTED NOT DETECTED Final   Parainfluenza Virus 3 NOT DETECTED NOT DETECTED Final   Parainfluenza Virus 4 NOT DETECTED NOT DETECTED Final   Respiratory Syncytial Virus NOT DETECTED NOT DETECTED Final   Bordetella pertussis NOT DETECTED NOT DETECTED Final   Bordetella Parapertussis NOT DETECTED NOT DETECTED Final   Chlamydophila pneumoniae NOT DETECTED NOT DETECTED Final   Mycoplasma pneumoniae NOT DETECTED NOT DETECTED Final    Comment: Performed at [facility], [address] [place], [place] [address]  Urine Culture     Status: None   Collection Time: 04/10/23  1:27 AM   Specimen: Urine, Random  Result Value Ref Range Status   Specimen Description   Final    URINE, RANDOM Performed at [facility], [address], [place], [place] [address]    Special Requests   Final    NONE Reflexed from [id number] Performed at [facility], [address] [place], [place] [address]    Culture   Final    NO GROWTH Performed at [facility], [address], [place], [place] [address]    Report Status 04/11/2023 FINAL  Final  C Difficile Quick Screen w PCR reflex     Status:  None   Collection Time: 04/10/23  2:00 PM   Specimen: STOOL  Result Value Ref Range Status   C Diff antigen NEGATIVE NEGATIVE Final   C Diff toxin NEGATIVE NEGATIVE Final   C Diff interpretation No C. difficile detected.  Final    Comment: Performed at [facility], [address] [place], [place] [address]  MRSA Next Gen by PCR, Nasal     Status: Abnormal   Collection Time: 04/10/23  7:18 PM   Specimen: Nasal Mucosa; Nasal Swab  Result Value Ref Range Status   MRSA by PCR Next Gen DETECTED (A) NOT DETECTED Final    Comment: RESULT CALLED TO, READ BACK BY AND VERIFIED WITH:  JACOB MOORE AT 2346 04/10/23 JG (NOTE) The GeneXpert MRSA Assay (FDA approved for NASAL specimens only), is one component of a comprehensive MRSA colonization surveillance program. It is not intended to diagnose MRSA infection nor to guide or monitor treatment for MRSA infections. Test performance is not FDA approved in patients less than 33 years old. Performed at [facility], [address] [place], [place] [address]   Culture, blood (Routine X  2) w Reflex to ID Panel     Status: None (Preliminary result)   Collection Time: 04/12/23  5:00 AM   Specimen: BLOOD  Result Value Ref Range Status   Specimen Description BLOOD UNKNOWN  Final   Special Requests   Final    BOTTLES DRAWN AEROBIC AND ANAEROBIC Blood Culture adequate volume   Culture   Final    NO GROWTH < 24 HOURS Performed at [facility], [address] [place], [place] [address]    Report Status PENDING  Incomplete  Culture, blood (Routine X 2) w Reflex to ID Panel     Status: None (Preliminary result)   Collection Time: 04/12/23  1:35 PM   Specimen: BLOOD  Result Value Ref Range Status   Specimen Description BLOOD BLOOD LEFT FOREARM  Final   Special Requests   Final    BOTTLES DRAWN AEROBIC AND ANAEROBIC Blood Culture adequate volume   Culture   Final    NO GROWTH < 24 HOURS Performed at [facility],  [address] [place], [place] [address]    Report Status PENDING  Incomplete     Radiology Studies: DG Chest Port 1 View Result Date: 04/12/2023 CLINICAL DATA:  Dyspnea and respiratory abnormality EXAM: PORTABLE CHEST 1 VIEW COMPARISON:  04/10/2023 FINDINGS: Low lung volumes accentuate cardiomediastinal silhouette and pulmonary vascularity. Bibasilar atelectasis or infiltrates. Question small left pleural effusion. No pneumothorax. Cervical spine fusion hardware. IMPRESSION: Low lung volumes with bibasilar atelectasis or infiltrates. Question small left pleural effusion. Electronically Signed   By: Norman Gatlin M.D.   On: 04/12/2023 13:12    Scheduled Meds:  acetaminophen   650 mg Oral Q6H   buPROPion   300 mg Oral Daily   furosemide   20 mg Oral BID   gabapentin   600 mg Oral TID   ipratropium-albuterol   3 mL Nebulization Q4H   leptospermum manuka honey  1 Application Topical Daily   lidocaine   1 patch Transdermal Q24H   lisinopril   10 mg Oral Daily   nystatin  cream   Topical BID   polyethylene glycol  17 g Oral BID   predniSONE   50 mg Oral Q breakfast   Warfarin - Pharmacist Dosing Inpatient   Does not apply q1600   Continuous Infusions:  vancomycin  1,500 mg (04/13/23 0836)      LOS: 3 days    Time spent:  55min  Harvis Mabus, DO [facility]  To contact the attending physician between 7A-7P please use Epic Chat. To contact the covering physician during after hours 7P-7A, please review Amion.   04/13/2023, 9:54 AM   *This document has been created with the assistance of dictation software. Please excuse typographical errors. *

## 2023-04-13 NOTE — Progress Notes (Signed)
 Patient C/O feeling short of breath and difficulty breathing. Patient SPO2 at 6 L ,87%. Patient moved up to 7 L and SPO2 93%. Continues to feel SOB. Respiratory contacted. Octaviano RT to apply high flow for patients comfort. Patient has had no urine output since 2200. Bladder scanned x 3, highest volume 179 ml. Denies feeling the urge to urinate at this time. PRN Dilaudid  requested throughout the evening for lower back pain 9/10 which has been effective. Call light and personal items within reach.

## 2023-04-13 NOTE — Progress Notes (Signed)
       CROSS COVER NOTE  NAME: [name] MRN: [id number] DOB : 12-Aug-1955 ATTENDING PHYSICIAN: Dezii, Alexandra, DO    Date of Service   04/13/2023   HPI/Events of Note   Called to room due to patient removing oxygen and refusing rapplication and confusion  Interventions   Assessment/Plan: Patient confused to place time situation. Thinks his wife and daughter have been kidnapped (wife deceased). He is paranoid thinking we are keeping him from the outside world. Told nurse he has 6 kids and keeps them in hte barn Respiration unlabored Eventually was able to reapply HFNC. Found sports on TV for hime to watch. Nurse attempted to call contact in chart, left message Continue to monitor  Delirium precautions Haldol  1 mg IV x1      Erminio LITTIE Cone NP [facility] Cross Cover 7pm-7am - check amion for availability Pager [phone]

## 2023-04-13 NOTE — Progress Notes (Signed)
 Pt declined offer to put him on HFNC

## 2023-04-13 NOTE — Plan of Care (Signed)
  Problem: Fluid Volume: Goal: Hemodynamic stability will improve Outcome: Progressing   Problem: Respiratory: Goal: Ability to maintain adequate ventilation will improve Outcome: Progressing   Problem: Clinical Measurements: Goal: Respiratory complications will improve Outcome: Progressing   Problem: Pain Managment: Goal: General experience of comfort will improve and/or be controlled Outcome: Progressing   Problem: Skin Integrity: Goal: Risk for impaired skin integrity will decrease Outcome: Progressing

## 2023-04-13 NOTE — Consult Note (Signed)
 PHARMACY - ANTICOAGULATION CONSULT NOTE  Pharmacy Consult for Warfarin Indication: hx recurrent VTE  Patient Measurements: Height: 4' 10 (147.3 cm) Weight: (!) 136.2 kg (300 lb 4.8 oz) IBW/kg (Calculated) : 45.4  Labs: Recent Labs    04/09/23 2025 04/09/23 2132 04/10/23 0127 04/10/23 1051  HGB 10.9*  --   --  10.8*  HCT 31.4*  --   --  31.4*  PLT 299  --   --  229  APTT  --   --  55*  --   LABPROT  --   --  30.6*  --   INR  --   --  2.9*  --   CREATININE 3.89*  --   --  3.11*  TROPONINIHS  --  22*  --   --    Estimated Creatinine Clearance: 26.6 mL/min (A) (by C-G formula based on SCr of 3.11 mg/dL (H)).  Medical History: Past Medical History:  Diagnosis Date   Depression    History of blood clots 08/13/2011   History of left knee replacement    HTN (hypertension)    Lymphedema    lt leg-per pt   PE (pulmonary thromboembolism) (HCC)    Pressure injury of skin, unspecified injury stage, unspecified location    S/P AKA (above knee amputation) bilateral (HCC)    rt 11/24/07 and lt 01/11/17   PTA warfarin dosing: Warfarin dosing difficult to determine. Went to speak with patient who could not give a firm regimen. Base dose is 5 mg but phones in INR weekly with clinic. He estimates an average of warfarin 6 mg daily. Last dose was 2/4 morning.  Assessment: 68 y.o. 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 Patient was hospitalized last month for recurrent VTE. Patient was on warfarin at the time of that VTE, but at presentation INR was subtherapeutic at 1.2 due to lack of enoxaparin  bridge, and so warfarin was seen as not a therapeutic failure.  For this admission, pharmacy has been consulted to manage warfarin dosing. Given history of recurrent VTE on anticoagulation, I would target upper end of INR goal  anyways.  New drug-drug interactions: N/A  Goal of Therapy:  INR 2-3 >> would aim for 2.5-3 Monitor platelets by anticoagulation protocol: Yes  Monitoring:  Date INR Assessment Warfarin Dose   2/5 2.9 Therapeutic 6 mg  2/6 4 Supratherapeutic Hold  2/7 4.5 Supratherapeutic Hold, Vit K 1mg  IV  2/8 2.8               Plan:  --INR is therapeutic --Hold warfarin today-planning Port removal.  per ID note: PORT will have to be removed but because INR is high awaiting stabilization of that.  --Appears patient may need procedures (port removal / TEE). F/u reversal if indicated. Patient may need bridging peri-operatively --Daily INR per protocol  Gorge Almanza A 04/10/2023 11:30 AM

## 2023-04-13 NOTE — Progress Notes (Signed)
 Placed pt on HFNC @ 60% & 40L due to sob.  O2 sat 94-97

## 2023-04-14 ENCOUNTER — Inpatient Hospital Stay: Payer: Medicare PPO | Admitting: Radiology

## 2023-04-14 DIAGNOSIS — R7881 Bacteremia: Secondary | ICD-10-CM | POA: Diagnosis not present

## 2023-04-14 DIAGNOSIS — R918 Other nonspecific abnormal finding of lung field: Secondary | ICD-10-CM | POA: Diagnosis not present

## 2023-04-14 DIAGNOSIS — Y95 Nosocomial condition: Secondary | ICD-10-CM | POA: Diagnosis not present

## 2023-04-14 DIAGNOSIS — B9562 Methicillin resistant Staphylococcus aureus infection as the cause of diseases classified elsewhere: Secondary | ICD-10-CM | POA: Diagnosis not present

## 2023-04-14 DIAGNOSIS — A4902 Methicillin resistant Staphylococcus aureus infection, unspecified site: Secondary | ICD-10-CM | POA: Diagnosis not present

## 2023-04-14 DIAGNOSIS — J9601 Acute respiratory failure with hypoxia: Secondary | ICD-10-CM

## 2023-04-14 DIAGNOSIS — J189 Pneumonia, unspecified organism: Secondary | ICD-10-CM | POA: Diagnosis not present

## 2023-04-14 HISTORY — PX: IR REMOVAL TUN ACCESS W/ PORT W/O FL MOD SED: IMG2290

## 2023-04-14 LAB — CBC WITH DIFFERENTIAL/PLATELET
Abs Immature Granulocytes: 0.15 10*3/uL — ABNORMAL HIGH (ref 0.00–0.07)
Basophils Absolute: 0 10*3/uL (ref 0.0–0.1)
Basophils Relative: 0 %
Eosinophils Absolute: 0 10*3/uL (ref 0.0–0.5)
Eosinophils Relative: 0 %
HCT: 31.8 % — ABNORMAL LOW (ref 39.0–52.0)
Hemoglobin: 10.6 g/dL — ABNORMAL LOW (ref 13.0–17.0)
Immature Granulocytes: 1 %
Lymphocytes Relative: 2 %
Lymphs Abs: 0.3 10*3/uL — ABNORMAL LOW (ref 0.7–4.0)
MCH: 31 pg (ref 26.0–34.0)
MCHC: 33.3 g/dL (ref 30.0–36.0)
MCV: 93 fL (ref 80.0–100.0)
Monocytes Absolute: 1 10*3/uL (ref 0.1–1.0)
Monocytes Relative: 8 %
Neutro Abs: 11.7 10*3/uL — ABNORMAL HIGH (ref 1.7–7.7)
Neutrophils Relative %: 89 %
Platelets: 298 10*3/uL (ref 150–400)
RBC: 3.42 MIL/uL — ABNORMAL LOW (ref 4.22–5.81)
RDW: 14.6 % (ref 11.5–15.5)
WBC: 13.2 10*3/uL — ABNORMAL HIGH (ref 4.0–10.5)
nRBC: 0 % (ref 0.0–0.2)

## 2023-04-14 LAB — COMPREHENSIVE METABOLIC PANEL
ALT: 24 U/L (ref 0–44)
AST: 32 U/L (ref 15–41)
Albumin: 3 g/dL — ABNORMAL LOW (ref 3.5–5.0)
Alkaline Phosphatase: 65 U/L (ref 38–126)
Anion gap: 10 (ref 5–15)
BUN: 60 mg/dL — ABNORMAL HIGH (ref 8–23)
CO2: 20 mmol/L — ABNORMAL LOW (ref 22–32)
Calcium: 9.9 mg/dL (ref 8.9–10.3)
Chloride: 104 mmol/L (ref 98–111)
Creatinine, Ser: 1.66 mg/dL — ABNORMAL HIGH (ref 0.61–1.24)
GFR, Estimated: 45 mL/min — ABNORMAL LOW (ref >60)
Glucose, Bld: 145 mg/dL — ABNORMAL HIGH (ref 70–99)
Potassium: 3.9 mmol/L (ref 3.5–5.1)
Sodium: 134 mmol/L — ABNORMAL LOW (ref 135–145)
Total Bilirubin: 1.3 mg/dL — ABNORMAL HIGH (ref 0.0–1.2)
Total Protein: 6.5 g/dL (ref 6.5–8.1)

## 2023-04-14 LAB — PHOSPHORUS: Phosphorus: 3.4 mg/dL (ref 2.5–4.6)

## 2023-04-14 LAB — PROTIME-INR
INR: 2.1 — ABNORMAL HIGH (ref 0.8–1.2)
Prothrombin Time: 23.8 s — ABNORMAL HIGH (ref 11.4–15.2)

## 2023-04-14 LAB — MAGNESIUM: Magnesium: 1.9 mg/dL (ref 1.7–2.4)

## 2023-04-14 LAB — VANCOMYCIN, RANDOM: Vancomycin Rm: 23 ug/mL

## 2023-04-14 MED ORDER — LIDOCAINE-EPINEPHRINE 1 %-1:100000 IJ SOLN
8.0000 mL | Freq: Once | INTRAMUSCULAR | Status: AC
  Administered 2023-04-14: 8 mL via INTRADERMAL
  Filled 2023-04-14: qty 8

## 2023-04-14 MED ORDER — LIDOCAINE-EPINEPHRINE 1 %-1:100000 IJ SOLN
INTRAMUSCULAR | Status: AC
  Filled 2023-04-14: qty 1

## 2023-04-14 MED ORDER — CHLORHEXIDINE GLUCONATE CLOTH 2 % EX PADS
6.0000 | MEDICATED_PAD | Freq: Every day | CUTANEOUS | Status: DC
  Administered 2023-04-15 – 2023-04-21 (×7): 6 via TOPICAL

## 2023-04-14 MED ORDER — IPRATROPIUM-ALBUTEROL 0.5-2.5 (3) MG/3ML IN SOLN: 3.0000 mL | RESPIRATORY_TRACT | Status: DC | PRN

## 2023-04-14 NOTE — Progress Notes (Signed)
 OT Cancellation Note  Patient Details Name: [name] MRN: [id number] DOB: [birth year]   Cancelled Treatment:    Reason Eval/Treat Not Completed: Patient at procedure or test/ unavailable. Chart reviewed, eval attempted with pt off the floor. OT will continue to follow and re-attempt as able.  Dorothyann Mourer L. Marticia Reifschneider, OTR/L  04/14/23, 11:29 AM

## 2023-04-14 NOTE — Progress Notes (Addendum)
 PROGRESS NOTE    [name]  FMW:[id number] DOB: Apr 11, 1955 DOA: 04/09/2023 PCP: Eliverto Bette Hover, MD  Chief Complaint  Patient presents with   Shortness of Breath    Hospital Course:  [name] is 68 y.o. male with stage IV prostate cancer, recurrent VTE, hypertension, PVD status post AKA, decubitus ulcers, who was admitted on 1/18 with shortness of breath and was found to have recurrent DVT despite Eliquis  and then Coumadin  (not technically a failure as he was not bridged.)  He had been unable to afford Lovenox  as an outpatient.  During hospitalization he was noted to have volume overload and was diuresed.  Stay was also complicated by ileus for stool impaction that was treated aggressively with a bowel regimen.  He was discharged in stable condition on 1/31.  He returned to the ED on 2/4 due to increasing confusion and shortness of breath.  He was found to be saturating 89% on room air.  EMS placed patient on CPAP prior to arrival.  In the ED he was found to be septic with fever of 101.9, respiratory rate 31, leukocytosis 20K, and hypoxia.  Chest x-ray revealed left basilar opacity.  He was placed on BiPAP due to work of breathing and hypoxemia.  He failed weaning trial due to work of breathing though he did have good oxygen saturations.  He was admitted for management of healthcare associated pneumonia and sepsis.  Subjective: Acute delirium overnight.  Required Haldol .  Respiratory status also worsening overnight.  Was on bipap but able to wean back to [place].  Evaluated after port removal. He is drowsy and confused.   Objective: Vitals:   04/13/23 2059 04/14/23 0006 04/14/23 0011 04/14/23 0325  BP:  94/62  118/89  Pulse:  73  65  Resp:  19  19  Temp:  98.5 F (36.9 C)  97.6 F (36.4 C)  TempSrc:  Oral  Oral  SpO2: 95% (!) 88% 93% 98%  Weight:      Height:        Intake/Output Summary (Last 24 hours) at 04/14/2023 0828 Last data filed at 04/14/2023 0006 Gross  per 24 hour  Intake --  Output 2850 ml  Net -2850 ml   Filed Weights   04/09/23 2019  Weight: 136 kg    Examination: General exam: Appears calm, some distress Respiratory system: Shortened expiratory phase, end exp wheeze. Cardiovascular system: S1 & S2 heard, RRR.  Gastrointestinal system: Abdomen is distended, Neuro:  Drowsy, disoriented, redirectable. Extremities: Bilateral AKA  Skin: No rashes, lesions Psychiatry: delirious. Calm.   Assessment & Plan:  Principal Problem:   HAP (hospital-acquired pneumonia) Active Problems:   Severe sepsis (HCC)   PE (pulmonary thromboembolism) (HCC)   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   Ogilvie syndrome   AKI (acute kidney injury) (HCC)   Prostate cancer metastatic to bone [facility])   Essential hypertension   MRSA bacteremia   Pulmonary infiltrates   Septic embolism (HCC)   Palliative care encounter    Severe sepsis - Criteria met with hypoxemia, fever, AKI, leukocytosis, tachypnea.  Source: Pneumonia and bacteremia - Antibiotics as below  MRSA bacteremia - Patient is on vancomycin  - ID consulted - He does have a port in place and there are multiple nodules in the lungs concerning for septic emboli - Echocardiogram without evidence of endocarditis, needs a TEE but respiratory status is unlikely to tolerate that at this time. - Port removed 2/9. Follow tip for culture  Healthcare associated pneumonia +/- septic emboli Hypoxic respiratory failure - Respiratory status is worsening repeat chest x-ray with worsening infiltrates and effusions -- Started on BID Lasix  2/8 and has had some improvement, this may worsen Cr.  -Still with wheezing on exam, continue prednisone  - Procalcitonin: 5.8 - Leukocytosis complicated by steroid use.  Pain - Suspect this is secondary to his multiple osseous lesions.  Continue with as needed Dilaudid  and Percocet.  Scheduled Tylenol  and lidocaine   AKI - Baseline creatinine  0.98, creatinine 3.89 on arrival - Creatinine had improved some but appears to be worsening again. Expect further worsening with lasix , but respiratory status necessitates diuresis  -- ID to consider linezolid . Has been on vanco - Status post IVF. -- Strict Ins and Outs. Consult renal if UOP declining  History of Ogilivie syndrome - Has had bowel movements this admission, none today. continue TID lactulose   Pressure ulcers of skin - Last noted on ischial tuberosity and left buttock - Continue with Medihoney to ulcers - Wound nurse consult  Pulmonary embolism Supratherapeutic INR - Has had recurrent VTEs on Eliquis , now on warfarin therapy --  Received low-dose vitamin K   2/7.  Avoid high-dose vitamin K  as he is very high risk for coagulopathy given his sepsis, malignancy, and history of recurrent VTE -- INR improved today -- Pharmacy to manage Warfarin dosing - Continue warfarin with pharmacy consult for dosing  Hypertension - Continue home meds  HF preserved EF - ECHO: EF 55-60%, no RWMA, mildly dilated LV, diastolic parameters indeterminate - Takes Daily lasix  at home  Prostate cancer metastatic to bone - Follows with oncology outpatient, currently holding treatment in setting of sepsis - Port removal as above - Palliative consult - Patient was on hospice outpatient but he misunderstood the services.  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  Further complicated by ongoing hypoxia, sepsis - Reinforce sleep-wake cycle - Frequent reorientation - Delirium precautions  Upper extremity weakness  - Worsening since admission, perhaps related to azotemia vs deconditioning vs delirium. PT/Ot consulted  DVT prophylaxis: Warfarin   Code  Status: Limited: Do not attempt resuscitation (DNR) -DNR-LIMITED -Do Not Intubate/DNI  Family Communication: discussed directly with patient Disposition:  Status is: Inpatient, workup ongoing.  Currently necessitates IV antibiotics. Further work up. Likely eventual DC to SNF    Consultants:  ID  Procedures:    Antimicrobials:  Anti-infectives (From admission, onward)    Start     Dose/Rate Route Frequency Ordered Stop   04/13/23 1030  vancomycin  variable dose per unstable renal function (pharmacist dosing)         Does not apply See admin instructions 04/13/23 1031     04/12/23 1000  vancomycin  (VANCOREADY) IVPB 1500 mg/300 mL  Status:  Discontinued        1,500 mg 100 mL/hr over 180 Minutes Intravenous Every 24 hours 04/12/23 0831 04/13/23 1031   04/11/23 1000  vancomycin  (VANCOREADY) IVPB 1500 mg/300 mL        1,500 mg 75 mL/hr over 240 Minutes Intravenous  Once 04/11/23 0828 04/11/23 1358   04/10/23 0308  vancomycin  variable dose per unstable renal function (pharmacist dosing)  Status:  Discontinued         Does  not apply See admin instructions 04/10/23 0308 04/12/23 0831   04/10/23 0145  vancomycin  (VANCOREADY) IVPB 1500 mg/300 mL        1,500 mg 100 mL/hr over 180 Minutes Intravenous  Once 04/10/23 0131 04/10/23 0546   04/10/23 0130  vancomycin  (VANCOCIN ) IVPB 1000 mg/200 mL premix  Status:  Discontinued        1,000 mg 200 mL/hr over 60 Minutes Intravenous  Once 04/10/23 0120 04/10/23 0131   04/09/23 2100  vancomycin  (VANCOCIN ) IVPB 1000 mg/200 mL premix        1,000 mg 200 mL/hr over 60 Minutes Intravenous  Once 04/09/23 2047 04/10/23 0113   04/09/23 2100  ceFEPIme  (MAXIPIME ) 2 g in sodium chloride  0.9 % 100 mL IVPB        2 g 200 mL/hr over 30 Minutes Intravenous  Once 04/09/23 2047 04/10/23 0113       Data Reviewed: I have personally reviewed following labs and imaging studies CBC: Recent Labs  Lab 04/09/23 2025 04/10/23 1051 04/12/23 0743 04/13/23 0322  04/14/23 0502  WBC 20.0* 16.3* 12.4* 16.9* 13.2*  NEUTROABS 18.2* 15.7* 11.4* 15.0* 11.7*  HGB 10.9* 10.8* 10.6* 10.8* 10.6*  HCT 31.4* 31.4* 31.7* 33.2* 31.8*  MCV 92.1 91.3 92.4 96.0 93.0  PLT 299 229 274 333 298   Basic Metabolic Panel: Recent Labs  Lab 04/10/23 1051 04/11/23 0603 04/12/23 0550 04/12/23 0743 04/13/23 0322 04/14/23 0502  NA 129* 133*  --  136 134* 134*  K 3.2* 2.8*  --  2.8* 3.2* 3.9  CL 96* 100  --  104 100 104  CO2 20* 19*  --  22 22 20*  GLUCOSE 171* 167*  --  139* 125* 145*  BUN 63* 61*  --  54* 57* 60*  CREATININE 3.11* 2.10* 1.35* 1.29* 2.35* 1.66*  CALCIUM 9.5 9.8  --  10.2 9.9 9.9  MG  --   --   --  1.9 1.9 1.9  PHOS  --   --   --  2.1* 3.4 3.4   GFR: Estimated Creatinine Clearance: 49.8 mL/min (A) (by C-G formula based on SCr of 1.66 mg/dL (H)). Liver Function Tests: Recent Labs  Lab 04/09/23 2025 04/10/23 1051 04/12/23 0743 04/13/23 0322 04/14/23 0502  AST 48* 44* 25 34 32  ALT 22 22 25 27 24   ALKPHOS 59 67 58 65 65  BILITOT 2.6* 2.1* 0.6 1.1 1.3*  PROT 7.2 6.9 6.8 6.7 6.5  ALBUMIN  3.8 3.3* 3.1* 3.3* 3.0*   CBG: No results for input(s): GLUCAP in the last 168 hours.   Recent Results (from the past 240 hours)  Resp panel by RT-PCR (RSV, Flu A&B, Covid) Anterior Nasal Swab     Status: None   Collection Time: 04/09/23  8:19 PM   Specimen: Anterior Nasal Swab  Result Value Ref Range Status   SARS Coronavirus 2 by RT PCR NEGATIVE NEGATIVE Final    Comment: (NOTE) SARS-CoV-2 target nucleic acids are NOT DETECTED.  The SARS-CoV-2 RNA is generally detectable in upper respiratory specimens during the acute phase of infection. The lowest concentration of SARS-CoV-2 viral copies this assay can detect is 138 copies/mL. A negative result does not preclude SARS-Cov-2 infection and should not be used as the sole basis for treatment or other patient management decisions. A negative result may occur with  improper specimen  collection/handling, submission of specimen other than nasopharyngeal swab, presence of viral mutation(s) within the areas targeted by this assay, and inadequate number of  viral copies(<138 copies/mL). A negative result must be combined with clinical observations, patient history, and epidemiological information. The expected result is Negative.  Fact Sheet for Patients:  bloggercourse.com  Fact Sheet for Healthcare Providers:  seriousbroker.it  This test is no t yet approved or cleared by the [place]  FDA and  has been authorized for detection and/or diagnosis of SARS-CoV-2 by FDA under an Emergency Use Authorization (EUA). This EUA will remain  in effect (meaning this test can be used) for the duration of the COVID-19 declaration under Section 564(b)(1) of the Act, 21 U.S.C.section 360bbb-3(b)(1), unless the authorization is terminated  or revoked sooner.       Influenza A by PCR NEGATIVE NEGATIVE Final   Influenza B by PCR NEGATIVE NEGATIVE Final    Comment: (NOTE) The Xpert Xpress SARS-CoV-2/FLU/RSV plus assay is intended as an aid in the diagnosis of influenza from Nasopharyngeal swab specimens and should not be used as a sole basis for treatment. Nasal washings and aspirates are unacceptable for Xpert Xpress SARS-CoV-2/FLU/RSV testing.  Fact Sheet for Patients: bloggercourse.com  Fact Sheet for Healthcare Providers: seriousbroker.it  This test is not yet approved or cleared by the [place]  FDA and has been authorized for detection and/or diagnosis of SARS-CoV-2 by FDA under an Emergency Use Authorization (EUA). This EUA will remain in effect (meaning this test can be used) for the duration of the COVID-19 declaration under Section 564(b)(1) of the Act, 21 U.S.C. section 360bbb-3(b)(1), unless the authorization is terminated or revoked.     Resp Syncytial  Virus by PCR NEGATIVE NEGATIVE Final    Comment: (NOTE) Fact Sheet for Patients: bloggercourse.com  Fact Sheet for Healthcare Providers: seriousbroker.it  This test is not yet approved or cleared by the [place]  FDA and has been authorized for detection and/or diagnosis of SARS-CoV-2 by FDA under an Emergency Use Authorization (EUA). This EUA will remain in effect (meaning this test can be used) for the duration of the COVID-19 declaration under Section 564(b)(1) of the Act, 21 U.S.C. section 360bbb-3(b)(1), unless the authorization is terminated or revoked.  Performed at [facility], [address] [place], [place] [address]   Blood Culture (routine x 2)     Status: Abnormal (Preliminary result)   Collection Time: 04/09/23  8:19 PM   Specimen: Right Antecubital; Blood  Result Value Ref Range Status   Specimen Description   Final    RIGHT ANTECUBITAL BLOOD Performed at [facility], [address], [place], [place] [address]    Special Requests   Final    BOTTLES DRAWN AEROBIC AND ANAEROBIC Blood Culture results may not be optimal due to an inadequate volume of blood received in culture bottles Performed at [facility], [address] [place], [place] [address]    Culture  Setup Time   Final    GRAM POSITIVE COCCI IN BOTH AEROBIC AND ANAEROBIC BOTTLES CRITICAL RESULT CALLED TO, READ BACK BY AND VERIFIED WITH: TREY GREENWOOD PHARMD 1137 04/10/23 HNM GRAM STAIN REVIEWED-AGREE WITH RESULT DRT    Culture (A)  Final    METHICILLIN RESISTANT STAPHYLOCOCCUS AUREUS Sent to [facility] for further susceptibility testing. Performed at [facility], [address] [place], [place] [address]    Report Status PENDING  Incomplete   Organism ID, Bacteria METHICILLIN RESISTANT STAPHYLOCOCCUS AUREUS  Final      Susceptibility   Methicillin resistant staphylococcus aureus - MIC*    CIPROFLOXACIN >=8  RESISTANT Resistant  ERYTHROMYCIN >=8 RESISTANT Resistant     GENTAMICIN <=0.5 SENSITIVE Sensitive     OXACILLIN >=4 RESISTANT Resistant     TETRACYCLINE <=1 SENSITIVE Sensitive     VANCOMYCIN  1 SENSITIVE Sensitive     TRIMETH /SULFA  <=10 SENSITIVE Sensitive     CLINDAMYCIN <=0.25 SENSITIVE Sensitive     RIFAMPIN <=0.5 SENSITIVE Sensitive     Inducible Clindamycin NEGATIVE Sensitive     LINEZOLID  2 SENSITIVE Sensitive     * METHICILLIN RESISTANT STAPHYLOCOCCUS AUREUS  Blood Culture (routine x 2)     Status: Abnormal   Collection Time: 04/09/23  8:19 PM   Specimen: BLOOD RIGHT FOREARM  Result Value Ref Range Status   Specimen Description   Final    BLOOD RIGHT FOREARM Performed at [facility], [address] [place], [place] [address]    Special Requests   Final    BOTTLES DRAWN AEROBIC AND ANAEROBIC Blood Culture results may not be optimal due to an inadequate volume of blood received in culture bottles Performed at [facility], [address] [place], [place] [address]    Culture  Setup Time   Final    GRAM POSITIVE COCCI AEROBIC BOTTLE ONLY CRITICAL VALUE NOTED.  VALUE IS CONSISTENT WITH PREVIOUSLY REPORTED AND CALLED VALUE. GRAM STAIN REVIEWED-AGREE WITH RESULT DRT    Culture (A)  Final    STAPHYLOCOCCUS AUREUS SUSCEPTIBILITIES PERFORMED ON PREVIOUS CULTURE WITHIN THE LAST 5 DAYS. Performed at [facility], [address], [place], [place] [address]    Report Status 04/12/2023 FINAL  Final  Blood Culture ID Panel (Reflexed)     Status: Abnormal   Collection Time: 04/09/23  8:19 PM  Result Value Ref Range Status   Enterococcus faecalis NOT DETECTED NOT DETECTED Final   Enterococcus Faecium NOT DETECTED NOT DETECTED Final   Listeria monocytogenes NOT DETECTED NOT DETECTED Final   Staphylococcus species DETECTED (A) NOT DETECTED Final    Comment: CRITICAL RESULT CALLED TO, READ BACK BY AND VERIFIED WITH: MOSE BLEW PHARMD 1137 04/10/23  HNM    Staphylococcus aureus (BCID) DETECTED (A) NOT DETECTED Final    Comment: Methicillin (oxacillin)-resistant Staphylococcus aureus (MRSA). MRSA is predictably resistant to beta-lactam antibiotics (except ceftaroline). Preferred therapy is vancomycin  unless clinically contraindicated. Patient requires contact precautions if  hospitalized. CRITICAL RESULT CALLED TO, READ BACK BY AND VERIFIED WITH: MOSE BLEW PHARMD 1137 04/10/23 HNM    Staphylococcus epidermidis NOT DETECTED NOT DETECTED Final   Staphylococcus lugdunensis NOT DETECTED NOT DETECTED Final   Streptococcus species NOT DETECTED NOT DETECTED Final   Streptococcus agalactiae NOT DETECTED NOT DETECTED Final   Streptococcus pneumoniae NOT DETECTED NOT DETECTED Final   Streptococcus pyogenes NOT DETECTED NOT DETECTED Final   A.calcoaceticus-baumannii NOT DETECTED NOT DETECTED Final   Bacteroides fragilis NOT DETECTED NOT DETECTED Final   Enterobacterales NOT DETECTED NOT DETECTED Final   Enterobacter cloacae complex NOT DETECTED NOT DETECTED Final   Escherichia coli NOT DETECTED NOT DETECTED Final   Klebsiella aerogenes NOT DETECTED NOT DETECTED Final   Klebsiella oxytoca NOT DETECTED NOT DETECTED Final   Klebsiella pneumoniae NOT DETECTED NOT DETECTED Final   Proteus species NOT DETECTED NOT DETECTED Final   Salmonella species NOT DETECTED NOT DETECTED Final   Serratia marcescens NOT DETECTED NOT DETECTED Final   Haemophilus influenzae NOT DETECTED NOT DETECTED Final   Neisseria meningitidis NOT DETECTED NOT DETECTED Final   Pseudomonas aeruginosa NOT DETECTED NOT DETECTED Final   Stenotrophomonas maltophilia NOT DETECTED NOT DETECTED  Final   Candida albicans NOT DETECTED NOT DETECTED Final   Candida auris NOT DETECTED NOT DETECTED Final   Candida glabrata NOT DETECTED NOT DETECTED Final   Candida krusei NOT DETECTED NOT DETECTED Final   Candida parapsilosis NOT DETECTED NOT DETECTED Final   Candida tropicalis NOT  DETECTED NOT DETECTED Final   Cryptococcus neoformans/gattii NOT DETECTED NOT DETECTED Final   Meth resistant mecA/C and MREJ DETECTED (A) NOT DETECTED Final    Comment: CRITICAL RESULT CALLED TO, READ BACK BY AND VERIFIED WITHBETHA MOSE BLEW PHARMD 1137 04/10/23 HNM Performed at [facility], [address] [place], [place] [address]   Respiratory (~20 pathogens) panel by PCR     Status: None   Collection Time: 04/10/23  1:27 AM   Specimen: Nasopharyngeal Swab; Respiratory  Result Value Ref Range Status   Adenovirus NOT DETECTED NOT DETECTED Final   Coronavirus 229E NOT DETECTED NOT DETECTED Final    Comment: (NOTE) The Coronavirus on the Respiratory Panel, DOES NOT test for the novel  Coronavirus (2019 nCoV)    Coronavirus HKU1 NOT DETECTED NOT DETECTED Final   Coronavirus NL63 NOT DETECTED NOT DETECTED Final   Coronavirus OC43 NOT DETECTED NOT DETECTED Final   Metapneumovirus NOT DETECTED NOT DETECTED Final   Rhinovirus / Enterovirus NOT DETECTED NOT DETECTED Final   Influenza A NOT DETECTED NOT DETECTED Final   Influenza B NOT DETECTED NOT DETECTED Final   Parainfluenza Virus 1 NOT DETECTED NOT DETECTED Final   Parainfluenza Virus 2 NOT DETECTED NOT DETECTED Final   Parainfluenza Virus 3 NOT DETECTED NOT DETECTED Final   Parainfluenza Virus 4 NOT DETECTED NOT DETECTED Final   Respiratory Syncytial Virus NOT DETECTED NOT DETECTED Final   Bordetella pertussis NOT DETECTED NOT DETECTED Final   Bordetella Parapertussis NOT DETECTED NOT DETECTED Final   Chlamydophila pneumoniae NOT DETECTED NOT DETECTED Final   Mycoplasma pneumoniae NOT DETECTED NOT DETECTED Final    Comment: Performed at [facility], [address], [place], [place] [address]  Urine Culture     Status: None   Collection Time: 04/10/23  1:27 AM   Specimen: Urine, Random  Result Value Ref Range Status   Specimen Description   Final    URINE, RANDOM Performed at [facility], [address], [place], [place] [address]    Special Requests   Final    NONE Reflexed from [id number] Performed at [facility], [address], [place], [place] [address]    Culture   Final    NO GROWTH Performed at [facility], [address] [place], [place] [address]    Report Status 04/11/2023 FINAL  Final  C Difficile Quick Screen w PCR reflex     Status: None   Collection Time: 04/10/23  2:00 PM   Specimen: STOOL  Result Value Ref Range Status   C Diff antigen NEGATIVE NEGATIVE Final   C Diff toxin NEGATIVE NEGATIVE Final   C Diff interpretation No C. difficile detected.  Final    Comment: Performed at [facility], [address] [place], [place] [address]  MRSA Next Gen by PCR, Nasal     Status: Abnormal   Collection Time: 04/10/23  7:18 PM   Specimen: Nasal Mucosa; Nasal Swab  Result Value Ref Range Status   MRSA by PCR Next Gen DETECTED (A) NOT DETECTED Final    Comment: RESULT CALLED TO, READ BACK BY AND VERIFIED WITH:  JACOB MOORE AT  2346 04/10/23 JG (NOTE) The GeneXpert MRSA Assay (FDA approved for NASAL specimens only), is one component of a comprehensive MRSA colonization surveillance program. It is not intended to diagnose MRSA infection nor to guide or monitor treatment for MRSA infections. Test performance is not FDA approved in patients less than 68 years old. Performed at [facility], [address] [place], [place] [address]   Culture, blood (Routine X 2) w Reflex to ID Panel     Status: None (Preliminary result)   Collection Time: 04/12/23  5:00 AM   Specimen: BLOOD  Result Value Ref Range Status   Specimen Description BLOOD UNKNOWN  Final   Special Requests   Final    BOTTLES DRAWN AEROBIC AND ANAEROBIC Blood Culture adequate volume   Culture   Final    NO GROWTH 2 DAYS Performed at [facility], [address], [place], [place] [address]    Report Status PENDING  Incomplete  Culture, blood (Routine X 2)  w Reflex to ID Panel     Status: None (Preliminary result)   Collection Time: 04/12/23  1:35 PM   Specimen: BLOOD  Result Value Ref Range Status   Specimen Description BLOOD BLOOD LEFT FOREARM  Final   Special Requests   Final    BOTTLES DRAWN AEROBIC AND ANAEROBIC Blood Culture adequate volume   Culture   Final    NO GROWTH 2 DAYS Performed at [facility], [address], [place], [place] [address]    Report Status PENDING  Incomplete     Radiology Studies: DG Chest Port 1 View Result Date: 04/13/2023 CLINICAL DATA:  Dyspnea EXAM: PORTABLE CHEST - 1 VIEW COMPARISON:  04/12/2023 FINDINGS: Unchanged cardiomegaly and mild pulmonary vascular congestion. Interval worsening of bilateral basilar airspace opacities. Small bilateral pleural effusions, appears slightly larger on the current exam. Postsurgical changes of the cervical spine are partially visualized. Right IJ central venous catheter unchanged in position terminating near the cavoatrial junction. IMPRESSION: 1. Unchanged cardiomegaly and pulmonary vascular congestion. 2. Interval worsening of minimal bilateral pleural effusions. Interval worsening of bibasilar airspace opacities likely combination of pulmonary edema and atelectasis. Electronically Signed   By: Aliene Lloyd M.D.   On: 04/13/2023 10:51   DG Chest Port 1 View Result Date: 04/12/2023 CLINICAL DATA:  Dyspnea and respiratory abnormality EXAM: PORTABLE CHEST 1 VIEW COMPARISON:  04/10/2023 FINDINGS: Low lung volumes accentuate cardiomediastinal silhouette and pulmonary vascularity. Bibasilar atelectasis or infiltrates. Question small left pleural effusion. No pneumothorax. Cervical spine fusion hardware. IMPRESSION: Low lung volumes with bibasilar atelectasis or infiltrates. Question small left pleural effusion. Electronically Signed   By: Norman Gatlin M.D.   On: 04/12/2023 13:12    Scheduled Meds:  acetaminophen   650 mg Oral Q6H   buPROPion   300 mg Oral Daily    furosemide   40 mg Intravenous BID   gabapentin   200 mg Oral TID   ipratropium-albuterol   3 mL Nebulization Q4H   lactulose   10 g Oral TID   leptospermum manuka honey  1 Application Topical Daily   lidocaine   1 patch Transdermal Q24H   nystatin  cream   Topical BID   polyethylene glycol  17 g Oral BID   predniSONE   50 mg Oral Q breakfast   vancomycin  variable dose per unstable renal function (pharmacist dosing)   Does not apply See admin instructions   Warfarin - Pharmacist Dosing Inpatient   Does not apply q1600   Continuous Infusions:      LOS: 4  days    Time spent:  55min  Marylene Masek, DO [facility]  To contact the attending physician between 7A-7P please use Epic Chat. To contact the covering physician during after hours 7P-7A, please review Amion.   04/14/2023, 8:28 AM   *This document has been created with the assistance of dictation software. Please excuse typographical errors. *

## 2023-04-14 NOTE — Plan of Care (Signed)
  Problem: Clinical Measurements: Goal: Diagnostic test results will improve Outcome: Progressing   Problem: Elimination: Goal: Will not experience complications related to bowel motility Outcome: Progressing   Problem: Safety: Goal: Ability to remain free from injury will improve Outcome: Progressing   Problem: Skin Integrity: Goal: Risk for impaired skin integrity will decrease Outcome: Progressing   Problem: Clinical Measurements: Goal: Ability to maintain a body temperature in the normal range will improve Outcome: Progressing

## 2023-04-14 NOTE — Progress Notes (Signed)
 Pharmacy Antibiotic Note  [name] is a 68 y.o. male admitted on 04/09/2023 with MRSA bacteremia.  Pharmacy has been consulted for Vancomycin  dosing.  Patient with recent admission for recurrent DVT, discharged 1/31.  Comes to ED 2/4 with confusion and shortness of breath.  CT chest/abdomen/pelvis lung findings concerning for septic pulmonary emboli.  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   Today, 04/14/2023 Day #5 vancomycin  Renal: AKI - SCr unstable 1.29 >> 2.35 >> 1.66 Afebrile/24h WBC 12.4 >> 16.9 >>13.2 2/4 blood culture: 3/4 bottles MRSA 2/5 Urine cx: NG Respiratory panel and respiratory virus panel neg 2/5 C difficile neg Rule out endocarditis with concern for septic pulmonary embolism on CT 2/5 ECHO: no endocarditis, TEE pending, PORT removal pending d/t elevated INR  2/7 repeat blood cultures: NG<24 hrs  Vancomycin  dosing/levels: Vancomycin  doses given 2/4 1gm IV x 1 at 2356 then 2/5 1500mg  IV  x1 2/6 random vancomycin  level at 0603 = 11 mcg/mL 2/6 vancomycin  1500mg  IV x 1 given at 0947, 2/7 random vancomycin  level 0553 = 16 mcg/mL 2/7 Vancomycin  1500 mg given 2/7@1046  2/8 vancomycin  1500 mg given 2/8 @0836  2/9 random vancomycin  level @0502  = 23 mcg/ml,  no dose ordered   Plan: Scr unstable  1.29 >> 2.35 >> 1.66 Furosemide  40 mg IV bid added 2/8 pm Last vancomycin  dose of 1500 mg given 2/8 @0836  AKI: Will change back to dosing per Vancomycin  levels. Vancomycin  random level 2/9 @ 0502= 23 mcg/ml No dose ordered. Will recheck random Vancomycin  level in am 2/10 Note history of infusion related reaction with vancomycin  - slow infusion Anticipate  some difficulty dosing vancomycin  with PMH Bilateral AKA - effect on volume of distribution and SCr (loss of muscle mass).   Follow renal function closely daily CMP ordered  until 2/12 Follow current cultures, ECHO, repeat blood cultures  Daptomycin- not currently option with septic PE, Pneumonia Await PORT removal (awaiting INR to come down) ID consult  Temp (24hrs), Avg:98.5 F (36.9 C), Min:97.6 F (36.4 C), Max:99.9 F (37.7 C)   Recent Labs  Lab 04/09/23 2025 04/09/23 2132 04/10/23 0127 04/10/23 0350 04/10/23 1051 04/10/23 1051 04/11/23 0603 04/12/23 0550 04/12/23 0553 04/12/23 0743 04/13/23 0322 04/14/23 0502  WBC 20.0*  --   --   --  16.3*  --   --   --   --  12.4* 16.9* 13.2*  CREATININE 3.89*  --   --   --  3.11*  --  2.10* 1.35*  --  1.29* 2.35* 1.66*  LATICACIDVEN  --  1.6 1.5 1.5  --   --   --   --   --   --   --   --   VANCORANDOM  --   --   --   --   --    < > 11  --  16  --   --  23   < > = values in this interval not displayed.    Estimated Creatinine Clearance: 49.8 mL/min (A) (by C-G formula based on SCr of 1.66 mg/dL (H)).    Allergies  Allergen Reactions   Bacitracin Hives   Vancomycin  Other (See Comments) and Rash    Red man syndrome.   Ketorolac     Other Reaction(s): Kidney Disorder  Other reaction(s): Kidney Disorder   Spironolactone     Other  Reaction(s): Unknown  Other reaction(s): Unknown    Antimicrobials this admission: 2/4 Cefepime  >> x 1 dose 2/4 Vancomycin  >>    Thank you for allowing pharmacy to be a part of this patient's care.  Allean Haas PharmD Clinical Pharmacist 04/14/2023

## 2023-04-14 NOTE — Evaluation (Signed)
 Physical Therapy Evaluation Patient Details Name: [name] MRN: [id number] DOB: [birth year] Today's Date: 04/14/2023  History of Present Illness  [name] is a 67yoM who comes to [facility] on 04/09/23 c confusion, SOB. Was here in Jan with recurrent DVT. Pt admitted with sepsis 2/4 due to PNA. 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 Pt previously on hospice at home, but no longer interested in this. Anticoaglulation being held as of 2/8 for pending port removal and TEE.  Clinical Impression  Pt in bed on entry, awake, has been NPO in preparation for procedures today, but says he feels good. Pt on HHHF on arrival. Pt reports successful return to transfers and modI mobility at home last DC. MMT of hip flexion/extension, gross UE push/pull robustly strong, however a combination of trunk weakness and likely hip mobility deficits make short sitting difficult without modifying bed features. Pt noted to have soiled bed, hence called nursing to assist. Pt will need a drop-arm BSC in room to perform toilet transfers. Pt denies any dyspnea during visit. Likely will be strong enough to resume transfers in preparation for home, however given his limited awareness of bowels, fluctuating mental status and limited ability to perform self-pericare, anticipate STR being a more realistic DC option for patient.        If plan is discharge home, recommend the following: A little help with walking and/or transfers;A little help with bathing/dressing/bathroom;Assist for transportation   Can travel by private vehicle   No    Equipment Recommendations None recommended by PT  Recommendations for Other Services       Functional Status Assessment Patient has had a recent decline in their functional status and demonstrates the ability to make significant improvements in function in a reasonable and predictable amount of time.      Precautions / Restrictions Precautions Precautions: Fall Restrictions Weight Bearing Restrictions Per Provider Order: No      Mobility  Bed Mobility Overal bed mobility: Needs Assistance Bed Mobility: Supine to Sit     Supine to sit: Min assist, Used rails, HOB elevated     General bed mobility comments: heavy bilat railing use to come to short sitting in bed from reclined; cannot stay there hands free, but can with 1 hand assist for a few seconds; reverse trend the bed to 20 degrees, pt can sit unsupported in short sitting    Transfers Overall transfer level:  (deferred due to wrong BSC in room, also bed is soiled with bowel, needs help cleaning up)                      Ambulation/Gait                  Stairs            Wheelchair Mobility     Tilt Bed    Modified Rankin (Stroke Patients Only)       Balance                                             Pertinent Vitals/Pain Pain Assessment Pain Assessment: 0-10 Pain Score: 8  Pain Location: back Pain Descriptors / Indicators: Grimacing, Guarding, Aching Pain Intervention(s): Monitored during session, Premedicated before session, Repositioned    Home Living  Family/patient expects to be discharged to:: Private residence Living Arrangements: Alone Available Help at Discharge: Friend(s);Available PRN/intermittently Type of Home: House Home Access: Ramped entrance;Other (comment)     Alternate Level Stairs-Number of Steps: has stair lift chair Home Layout: Two level;Able to live on main level with bedroom/bathroom Home Equipment: Shower seat;Grab bars - toilet;Grab bars - tub/shower;Wheelchair - [employer] - power;Hospital bed Additional Comments: Home is wheelchair accessible throughout; has Life alert.    Prior Function Prior Level of Function : Independent/Modified Independent             Mobility Comments: Modified independent w/c level; has a gym in  his garage ADLs Comments: Modified independent.  Does 180 degree turn in power w/c before transferring to toilet (has multiple grab bars to use)     Extremity/Trunk Assessment   Upper Extremity Assessment Upper Extremity Assessment: Overall WFL for tasks assessed    Lower Extremity Assessment Lower Extremity Assessment: Overall WFL for tasks assessed       Communication      Cognition                                                General Comments      Exercises     Assessment/Plan    PT Assessment Patient needs continued PT services  PT Problem List Decreased strength;Decreased activity tolerance;Decreased balance;Decreased mobility;Pain       PT Treatment Interventions DME instruction;Functional mobility training;Therapeutic activities;Therapeutic exercise;Balance training;Patient/family education    PT Goals (Current goals can be found in the Care Plan section)  Acute Rehab PT Goals Patient Stated Goal: to return to PLOF PT Goal Formulation: With patient Time For Goal Achievement: 04/28/23 Potential to Achieve Goals: Poor    Frequency Min 1X/week     Co-evaluation               AM-PAC PT 6 Clicks Mobility  Outcome Measure Help needed turning from your back to your side while in a flat bed without using bedrails?: None Help needed moving from lying on your back to sitting on the side of a flat bed without using bedrails?: None Help needed moving to and from a bed to a chair (including a wheelchair)?: A Little Help needed standing up from a chair using your arms (e.g., wheelchair or bedside chair)?: Total Help needed to walk in hospital room?: Total Help needed climbing 3-5 steps with a railing? : Total 6 Click Score: 14    End of Session Equipment Utilized During Treatment: Oxygen Activity Tolerance: Patient tolerated treatment well;No increased pain Patient left: in bed;with call bell/phone within reach;with bed alarm  set Nurse Communication: Mobility status PT Visit Diagnosis: Other abnormalities of gait and mobility (R26.89)    Time: 9069-9060 PT Time Calculation (min) (ACUTE ONLY): 9 min   Charges:   PT Evaluation $PT Eval Moderate Complexity: 1 Mod   PT General Charges $$ ACUTE PT VISIT: 1 Visit        3:36 PM, 04/14/23 Peggye JAYSON Linear, PT, DPT Physical Therapist - [facility]  [phone] (ASCOM)    Maston Wight C 04/14/2023, 3:23 PM

## 2023-04-14 NOTE — Progress Notes (Signed)
 Date of Admission:  04/09/2023   T ID: [name] is a 68 y.o. male  Principal Problem:   HAP (hospital-acquired pneumonia) Active Problems:   Prostate cancer metastatic to bone [facility])   PE (pulmonary thromboembolism) (HCC)   Essential hypertension   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   Ogilvie syndrome   Severe sepsis (HCC)   AKI (acute kidney injury) (HCC)   MRSA bacteremia   Pulmonary infiltrates   Septic embolism (HCC)   Palliative care encounter    Subjective: Pt is sleeping  Had PORT removed today   Medications:   acetaminophen   650 mg Oral Q6H   buPROPion   300 mg Oral Daily   furosemide   40 mg Intravenous BID   gabapentin   200 mg Oral TID   ipratropium-albuterol   3 mL Nebulization Q4H   lactulose   10 g Oral TID   leptospermum manuka honey  1 Application Topical Daily   lidocaine   1 patch Transdermal Q24H   nystatin  cream   Topical BID   polyethylene glycol  17 g Oral BID   predniSONE   50 mg Oral Q breakfast   vancomycin  variable dose per unstable renal function (pharmacist dosing)   Does not apply See admin instructions   Warfarin - Pharmacist Dosing Inpatient   Does not apply q1600    Objective: Vital signs in last 24 hours: Patient Vitals for the past 24 hrs:  BP Temp Temp src Pulse Resp SpO2  04/14/23 1217 -- -- -- -- -- 98 %  04/14/23 1149 92/79 98.7 F (37.1 C) Oral 84 19 93 %  04/14/23 0935 126/72 98.4 F (36.9 C) Oral 75 16 --  04/14/23 0736 -- -- -- -- -- 92 %  04/14/23 0325 118/89 97.6 F (36.4 C) Oral 65 19 98 %  04/14/23 0011 -- -- -- -- -- 93 %  04/14/23 0006 94/62 98.5 F (36.9 C) Oral 73 19 (!) 88 %  04/13/23 2059 -- -- -- -- -- 95 %  04/13/23 2055 111/71 98.1 F (36.7 C) Oral 81 19 93 %  04/13/23 1653 (!) 140/91 99.9 F (37.7 C) Oral 84 -- 96 %  04/13/23 1611 -- -- -- -- -- 92 %      PHYSICAL EXAM:  General: sleeping , not sob Port site covered with dressing Lungs: Bilateral air entry Heart:  S1-S2. Abdomen: Soft, non-tender,not distended. Bowel sounds normal. No masses Extremities: Bilateral AKA  skin: No rashes or lesions. Or bruising Lymph: Cervical, supraclavicular normal. Neurologic: Grossly non-focal  Lab Results    Latest Ref Rng & Units 04/14/2023    5:02 AM 04/13/2023    3:22 AM 04/12/2023    7:43 AM  CBC  WBC 4.0 - 10.5 K/uL 13.2  16.9  12.4   Hemoglobin 13.0 - 17.0 g/dL 89.3  89.1  89.3   Hematocrit 39.0 - 52.0 % 31.8  33.2  31.7   Platelets 150 - 400 K/uL 298  333  274        Latest Ref Rng & Units 04/14/2023    5:02 AM 04/13/2023    3:22 AM 04/12/2023    7:43 AM  CMP  Glucose 70 - 99 mg/dL 854  874  860   BUN 8 - 23 mg/dL 60  57  54   Creatinine 0.61 - 1.24 mg/dL 8.33  7.64  8.70   Sodium 135 - 145 mmol/L 134  134  136   Potassium 3.5 - 5.1 mmol/L 3.9  3.2  2.8   Chloride 98 - 111 mmol/L 104  100  104   CO2 22 - 32 mmol/L 20  22  22    Calcium 8.9 - 10.3 mg/dL 9.9  9.9  89.7   Total Protein 6.5 - 8.1 g/dL 6.5  6.7  6.8   Total Bilirubin 0.0 - 1.2 mg/dL 1.3  1.1  0.6   Alkaline Phos 38 - 126 U/L 65  65  58   AST 15 - 41 U/L 32  34  25   ALT 0 - 44 U/L 24  27  25        Microbiology: [facility] MRSA on 04/09/23 both sets 2/7 BC- NG So far 2/9 cath tip sent for culture  Studies/Results: IR REMOVAL TUN ACCESS W/ PORT W/O FL MOD SED Result Date: 04/14/2023 INDICATION: 68 year old male with a chronic indwelling port catheter which was placed by interventional radiology in September of 2024. Patient is currently admitted with MRSA bacteremia and pneumonia. He requires port catheter explantation. EXAM: REMOVAL RIGHT IJ VEIN PORT-A-CATH MEDICATIONS: None ANESTHESIA/SEDATION: None FLUOROSCOPY TIME:  None COMPLICATIONS: None immediate. PROCEDURE: Informed written consent was obtained from the patient after a thorough discussion of the procedural risks, benefits and alternatives. All questions were addressed. Maximal Sterile Barrier Technique was utilized including caps, mask,  sterile gowns, sterile gloves, sterile drape, hand hygiene and skin antiseptic. A timeout was performed prior to the initiation of the procedure. The right chest was prepped and draped in a sterile fashion. Lidocaine  was utilized for local anesthesia. An incision was made over the previously healed surgical incision. Utilizing blunt dissection, the port catheter and reservoir were removed from the underlying subcutaneous tissue in their entirety. Securing sutures were also removed. The pocket was irrigated with a copious amount of sterile normal saline. The pocket was closed with interrupted 3-0 Vicryl stitches. The subcutaneous tissue was closed with 3-0 Vicryl interrupted subcutaneous stitches. A 4-0 Vicryl running subcuticular stitch was utilized to approximate the skin. Dermabond was applied. IMPRESSION: Successful right IJ vein Port-A-Cath explantation. Electronically Signed   By: Wilkie Lent M.D.   On: 04/14/2023 12:06   DG Chest Port 1 View Result Date: 04/13/2023 CLINICAL DATA:  Dyspnea EXAM: PORTABLE CHEST - 1 VIEW COMPARISON:  04/12/2023 FINDINGS: Unchanged cardiomegaly and mild pulmonary vascular congestion. Interval worsening of bilateral basilar airspace opacities. Small bilateral pleural effusions, appears slightly larger on the current exam. Postsurgical changes of the cervical spine are partially visualized. Right IJ central venous catheter unchanged in position terminating near the cavoatrial junction. IMPRESSION: 1. Unchanged cardiomegaly and pulmonary vascular congestion. 2. Interval worsening of minimal bilateral pleural effusions. Interval worsening of bibasilar airspace opacities likely combination of pulmonary edema and atelectasis. Electronically Signed   By: Aliene Lloyd M.D.   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

## 2023-04-14 NOTE — Consult Note (Signed)
 PHARMACY - ANTICOAGULATION CONSULT NOTE  Pharmacy Consult for Warfarin Indication: hx recurrent VTE  Patient Measurements: Height: 4' 10 (147.3 cm) Weight: (!) 136.2 kg (300 lb 4.8 oz) IBW/kg (Calculated) : 45.4  Labs: Recent Labs    04/09/23 2025 04/09/23 2132 04/10/23 0127 04/10/23 1051  HGB 10.9*  --   --  10.8*  HCT 31.4*  --   --  31.4*  PLT 299  --   --  229  APTT  --   --  55*  --   LABPROT  --   --  30.6*  --   INR  --   --  2.9*  --   CREATININE 3.89*  --   --  3.11*  TROPONINIHS  --  22*  --   --    Estimated Creatinine Clearance: 26.6 mL/min (A) (by C-G formula based on SCr of 3.11 mg/dL (H)).  Medical History: Past Medical History:  Diagnosis Date   Depression    History of blood clots 08/13/2011   History of left knee replacement    HTN (hypertension)    Lymphedema    lt leg-per pt   PE (pulmonary thromboembolism) (HCC)    Pressure injury of skin, unspecified injury stage, unspecified location    S/P AKA (above knee amputation) bilateral (HCC)    rt 11/24/07 and lt 01/11/17   PTA warfarin dosing: Warfarin dosing difficult to determine. Went to speak with patient who could not give a firm regimen. Base dose is 5 mg but phones in INR weekly with clinic. He estimates an average of warfarin 6 mg daily. Last dose was 2/4 morning.  Assessment: 68 y.o. 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 Patient was hospitalized last month for recurrent VTE. Patient was on warfarin at the time of that VTE, but at presentation INR was subtherapeutic at 1.2 due to lack of enoxaparin  bridge, and so warfarin was seen as not a therapeutic failure.  For this admission, pharmacy has been consulted to manage warfarin dosing. Given history of recurrent VTE on anticoagulation, I would target upper end of INR goal  anyways.  New drug-drug interactions: N/A  Goal of Therapy:  INR 2-3 >> would aim for 2.5-3 Monitor platelets by anticoagulation protocol: Yes  Monitoring:  Date INR Assessment Warfarin Dose   2/5 2.9 Therapeutic 6 mg  2/6 4 Supratherapeutic Hold  2/7 4.5 Supratherapeutic Hold, Vit K 1mg  IV for upcoming port removal   2/8 2.8 therapeutic HOLD for procedure  2/9 2.1 subtherapeutic HOLD for procedure        Plan:  --INR is subtherapeutic per goal of therapy listed above --Hold warfarin again today-planning Port removal.  per ID note: PORT will have to be removed but because INR is high awaiting stabilization of that.  --Appears patient may need procedures (port removal / TEE).  Patient may need bridging peri-operatively.   --Daily INR per protocol  Allean Haas PharmD Clinical Pharmacist 04/14/2023

## 2023-04-14 NOTE — Procedures (Signed)
 Interventional Radiology Procedure Note  Procedure: Port removal  Complications: None  Estimated Blood Loss: None  Recommendations: - cath tip sent for cx  Signed,  Roxie Cord, MD

## 2023-04-15 ENCOUNTER — Other Ambulatory Visit: Payer: Self-pay

## 2023-04-15 DIAGNOSIS — C61 Malignant neoplasm of prostate: Secondary | ICD-10-CM | POA: Diagnosis not present

## 2023-04-15 DIAGNOSIS — Y95 Nosocomial condition: Secondary | ICD-10-CM | POA: Diagnosis not present

## 2023-04-15 DIAGNOSIS — R7881 Bacteremia: Secondary | ICD-10-CM

## 2023-04-15 DIAGNOSIS — I76 Septic arterial embolism: Secondary | ICD-10-CM | POA: Diagnosis not present

## 2023-04-15 DIAGNOSIS — B9562 Methicillin resistant Staphylococcus aureus infection as the cause of diseases classified elsewhere: Secondary | ICD-10-CM | POA: Diagnosis not present

## 2023-04-15 DIAGNOSIS — J189 Pneumonia, unspecified organism: Secondary | ICD-10-CM | POA: Diagnosis not present

## 2023-04-15 LAB — COMPREHENSIVE METABOLIC PANEL
ALT: 31 U/L (ref 0–44)
AST: 33 U/L (ref 15–41)
Albumin: 2.8 g/dL — ABNORMAL LOW (ref 3.5–5.0)
Alkaline Phosphatase: 60 U/L (ref 38–126)
Anion gap: 10 (ref 5–15)
BUN: 47 mg/dL — ABNORMAL HIGH (ref 8–23)
CO2: 24 mmol/L (ref 22–32)
Calcium: 9.9 mg/dL (ref 8.9–10.3)
Chloride: 104 mmol/L (ref 98–111)
Creatinine, Ser: 0.95 mg/dL (ref 0.61–1.24)
GFR, Estimated: >60 mL/min (ref >60)
Glucose, Bld: 116 mg/dL — ABNORMAL HIGH (ref 70–99)
Potassium: 2.7 mmol/L — CL (ref 3.5–5.1)
Sodium: 138 mmol/L (ref 135–145)
Total Bilirubin: 0.5 mg/dL (ref 0.0–1.2)
Total Protein: 6.4 g/dL — ABNORMAL LOW (ref 6.5–8.1)

## 2023-04-15 LAB — PHOSPHORUS: Phosphorus: 2.7 mg/dL (ref 2.5–4.6)

## 2023-04-15 LAB — CBC WITH DIFFERENTIAL/PLATELET
Abs Immature Granulocytes: 0.33 10*3/uL — ABNORMAL HIGH (ref 0.00–0.07)
Basophils Absolute: 0 10*3/uL (ref 0.0–0.1)
Basophils Relative: 0 %
Eosinophils Absolute: 0.1 10*3/uL (ref 0.0–0.5)
Eosinophils Relative: 1 %
HCT: 30.9 % — ABNORMAL LOW (ref 39.0–52.0)
Hemoglobin: 10.5 g/dL — ABNORMAL LOW (ref 13.0–17.0)
Immature Granulocytes: 3 %
Lymphocytes Relative: 4 %
Lymphs Abs: 0.5 10*3/uL — ABNORMAL LOW (ref 0.7–4.0)
MCH: 30.6 pg (ref 26.0–34.0)
MCHC: 34 g/dL (ref 30.0–36.0)
MCV: 90.1 fL (ref 80.0–100.0)
Monocytes Absolute: 0.8 10*3/uL (ref 0.1–1.0)
Monocytes Relative: 7 %
Neutro Abs: 8.9 10*3/uL — ABNORMAL HIGH (ref 1.7–7.7)
Neutrophils Relative %: 85 %
Platelets: 376 10*3/uL (ref 150–400)
RBC: 3.43 MIL/uL — ABNORMAL LOW (ref 4.22–5.81)
RDW: 14.6 % (ref 11.5–15.5)
WBC: 10.6 10*3/uL — ABNORMAL HIGH (ref 4.0–10.5)
nRBC: 0 % (ref 0.0–0.2)

## 2023-04-15 LAB — VANCOMYCIN, RANDOM: Vancomycin Rm: 12 ug/mL

## 2023-04-15 LAB — PROTIME-INR
INR: 1.9 — ABNORMAL HIGH (ref 0.8–1.2)
Prothrombin Time: 22 s — ABNORMAL HIGH (ref 11.4–15.2)

## 2023-04-15 LAB — MAGNESIUM: Magnesium: 1.9 mg/dL (ref 1.7–2.4)

## 2023-04-15 LAB — POTASSIUM: Potassium: 2.8 mmol/L — ABNORMAL LOW (ref 3.5–5.1)

## 2023-04-15 MED ORDER — PREDNISONE 20 MG PO TABS
30.0000 mg | ORAL_TABLET | Freq: Every day | ORAL | Status: DC
  Administered 2023-04-16 – 2023-04-18 (×3): 30 mg via ORAL
  Filled 2023-04-15 (×3): qty 1

## 2023-04-15 MED ORDER — PREDNISONE 50 MG PO TABS
50.0000 mg | ORAL_TABLET | Freq: Once | ORAL | Status: AC
  Administered 2023-04-15: 50 mg via ORAL
  Filled 2023-04-15: qty 1

## 2023-04-15 MED ORDER — POTASSIUM CHLORIDE 10 MEQ/100ML IV SOLN
10.0000 meq | INTRAVENOUS | Status: AC
Start: 2023-04-15 — End: 2023-04-15
  Filled 2023-04-15 (×4): qty 100

## 2023-04-15 MED ORDER — DICYCLOMINE HCL 10 MG PO CAPS
10.0000 mg | ORAL_CAPSULE | Freq: Three times a day (TID) | ORAL | Status: DC
  Administered 2023-04-15 – 2023-04-26 (×39): 10 mg via ORAL
  Filled 2023-04-15 (×45): qty 1

## 2023-04-15 MED ORDER — POTASSIUM CHLORIDE CRYS ER 20 MEQ PO TBCR
40.0000 meq | EXTENDED_RELEASE_TABLET | ORAL | Status: AC
  Administered 2023-04-15 (×3): 40 meq via ORAL
  Filled 2023-04-15 (×3): qty 2

## 2023-04-15 MED ORDER — WARFARIN SODIUM 10 MG PO TABS
10.0000 mg | ORAL_TABLET | ORAL | Status: AC
  Administered 2023-04-15: 10 mg via ORAL
  Filled 2023-04-15: qty 1

## 2023-04-15 MED ORDER — VANCOMYCIN HCL 1500 MG/300ML IV SOLN
1500.0000 mg | INTRAVENOUS | Status: DC
  Administered 2023-04-15 – 2023-04-17 (×3): 1500 mg via INTRAVENOUS
  Filled 2023-04-15 (×4): qty 300

## 2023-04-15 MED ORDER — ENOXAPARIN SODIUM 150 MG/ML IJ SOSY
1.0000 mg/kg | PREFILLED_SYRINGE | Freq: Two times a day (BID) | INTRAMUSCULAR | Status: DC
  Administered 2023-04-15 – 2023-04-16 (×4): 135 mg via SUBCUTANEOUS
  Filled 2023-04-15 (×5): qty 0.9

## 2023-04-15 MED ORDER — POTASSIUM CHLORIDE CRYS ER 20 MEQ PO TBCR
40.0000 meq | EXTENDED_RELEASE_TABLET | Freq: Once | ORAL | Status: AC
  Administered 2023-04-15: 40 meq via ORAL
  Filled 2023-04-15: qty 2

## 2023-04-15 NOTE — Consult Note (Addendum)
 PHARMACY - ANTICOAGULATION CONSULT NOTE  Pharmacy Consult for Warfarin Indication: hx recurrent VTE  Patient Measurements: Height: 4\' 10"  (147.3 cm) Weight: (!) 136.2 kg (300 lb 4.8 oz) IBW/kg (Calculated) : 45.4  Labs: Recent Labs    04/09/23 2025 04/09/23 2132 04/10/23 0127 04/10/23 1051  HGB 10.9*  --   --  10.8*  HCT 31.4*  --   --  31.4*  PLT 299  --   --  229  APTT  --   --  55*  --   LABPROT  --   --  30.6*  --   INR  --   --  2.9*  --   CREATININE 3.89*  --   --  3.11*  TROPONINIHS  --  22*  --   --    Estimated Creatinine Clearance: 26.6 mL/min (A) (by C-G formula based on SCr of 3.11 mg/dL (H)).  Medical History: Past Medical History:  Diagnosis Date   Depression    History of blood clots 08/13/2011   History of left knee replacement    HTN (hypertension)    Lymphedema    lt leg-per pt   PE (pulmonary thromboembolism) (HCC)    Pressure injury of skin, unspecified injury stage, unspecified location    S/P AKA (above knee amputation) bilateral (HCC)    rt 11/24/07 and lt 01/11/17   PTA warfarin dosing: Warfarin dosing difficult to determine. Went to speak with patient who could not give a firm regimen. Base dose is 5 mg but phones in INR weekly with clinic. He estimates an average of warfarin 6 mg daily. Last dose was 2/4 morning.  Assessment: 68 y.o. 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 Patient was hospitalized last month for recurrent VTE. Patient was on warfarin at the time of that VTE, but at presentation INR was subtherapeutic at 1.2 due to lack of enoxaparin  bridge, and so warfarin was seen as not a therapeutic failure. Pt has a large body habitus, and will require higher doses of warfarin. Unsure what his dose was outpatient if the clinic made any adjustments, so not sure what caused the  supratherapeutic INR. Last admission we had to give 10-15 mg doses to get INR therapeutic. No major DDI. Albumin  is low at 2.8, which would allow more free warfarin.    New drug-drug interactions: N/A  Goal of Therapy:  INR 2-3 Monitor platelets by anticoagulation protocol: Yes  Monitoring:  Date INR Warfarin Dose   2/5 2.9 6 mg  2/6 4 Hold  2/7 4.5 Hold, Vit K 1mg  IV for upcoming port removal   2/8 2.8 HOLD for procedure  2/9 2.1 HOLD for procedure  2/10 1.9 10 mg   Plan:  INR is subtherapeutic due to vitamin K and held warfarin doses 2/8 and 2/9. Predict INR to continue to trend down 2/11. Will give warfarin 10 mg x 1 tonight. I predict at discharge pt may need 10-12 mg daily based on last admission data. When pt received 7.5 mg doses last admission the INR dropped to 1.5 after receiving multiple 15 mg doses. As INR is subtherapeutic, will recommend a enoxaparin  bridge. May stop enoxaparin  after INR is therapeutic x 24 hours. Goal should be to maintain INR > 2.   Harlie Lieu, PharmD Clinical Pharmacist 04/15/2023

## 2023-04-15 NOTE — Progress Notes (Signed)
 Physical Therapy Treatment Patient Details Name: [name] MRN: [id number] DOB: 1955-11-28 Today's Date: 04/15/2023   History of Present Illness [name] is a 67yoM who comes to [facility] on 04/09/23 c confusion, SOB. Was here in Jan with recurrent DVT. Pt admitted with sepsis 2/4 due to PNA. 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 Pt previously on hospice at home, but no longer interested in this. Anticoaglulation being held as of 2/8 for pending port removal and TEE.    PT Comments  Pt is making gradual progress towards goals with ability to perform there-ex using yellow theraband. Cleared for bed level activity via MD this date. Will continue to progress. O2 sats WNL on 2L of O2.    If plan is discharge home, recommend the following: A little help with walking and/or transfers;A little help with bathing/dressing/bathroom;Assist for transportation   Can travel by private vehicle     No  Equipment Recommendations  None recommended by PT    Recommendations for Other Services       Precautions / Restrictions Precautions Precautions: Fall Precaution Comments: B AKA Restrictions Weight Bearing Restrictions Per Provider Order: No Other Position/Activity Restrictions: B AKA     Mobility  Bed Mobility               General bed mobility comments: reports he is rolling pretty well this AM, declines to attempt due to pain    Transfers                   General transfer comment: not medically stable for transfer this date    Ambulation/Gait               General Gait Details: non ambulatory at baseline   Stairs             Wheelchair Mobility     Tilt Bed    Modified Rankin (Stroke Patients Only)       Balance                                            Cognition Arousal: Alert Behavior During Therapy: WFL for tasks  assessed/performed Overall Cognitive Status: Within Functional Limits for tasks assessed                                 General Comments: agreeable to session, limited by L back pain        Exercises Other Exercises Other Exercises: discussed the importance of UE ther-ex to assist with transfers when medically approriate. Pt able to perform all ther-ex with yellow theraband including B UE rows, cross body pulls, and bicep curls. 10 reps performed. Also wrote down for him to perform    General Comments        Pertinent Vitals/Pain Pain Assessment Pain Assessment: 0-10 Pain Score: 5  Pain Location: back Pain Descriptors / Indicators: Grimacing, Guarding, Aching Pain Intervention(s): Limited activity within patient's tolerance, RN gave pain meds during session, Repositioned    Home Living                          Prior Function  PT Goals (current goals can now be found in the care plan section) Acute Rehab PT Goals Patient Stated Goal: to return to PLOF PT Goal Formulation: With patient Time For Goal Achievement: 04/28/23 Potential to Achieve Goals: Poor Progress towards PT goals: Progressing toward goals    Frequency    Min 1X/week      PT Plan      Co-evaluation              AM-PAC PT "6 Clicks" Mobility   Outcome Measure  Help needed turning from your back to your side while in a flat bed without using bedrails?: None Help needed moving from lying on your back to sitting on the side of a flat bed without using bedrails?: None Help needed moving to and from a bed to a chair (including a wheelchair)?: A Lot Help needed standing up from a chair using your arms (e.g., wheelchair or bedside chair)?: Total Help needed to walk in hospital room?: Total Help needed climbing 3-5 steps with a railing? : Total 6 Click Score: 13    End of Session Equipment Utilized During Treatment: Oxygen Activity Tolerance: Patient  tolerated treatment well;No increased pain Patient left: in bed;with call bell/phone within reach;with bed alarm set Nurse Communication: Mobility status PT Visit Diagnosis: Other abnormalities of gait and mobility (R26.89)     Time: 9604-5409 PT Time Calculation (min) (ACUTE ONLY): 21 min  Charges:    $Therapeutic Exercise: 8-22 mins PT General Charges $$ ACUTE PT VISIT: 1 Visit                     Amparo Balk, PT, DPT, GCS [phone]    Britta Louth 04/15/2023, 12:16 PM

## 2023-04-15 NOTE — Care Management Important Message (Signed)
 Important Message  Patient Details  Name: [name] MRN: [id number] Date of Birth: Jun 25, 1955   Important Message Given:  Yes - Medicare IM     Felix Host 04/15/2023, 3:27 PM

## 2023-04-15 NOTE — Progress Notes (Signed)
 Pharmacy Antibiotic Note  [name] is a 68 y.o. male admitted on 04/09/2023 with MRSA bacteremia.  Pharmacy has been consulted for Vancomycin  dosing.  Patient with recent admission for recurrent DVT, discharged 1/31.  Comes to ED 2/4 with confusion and shortness of breath.  CT chest/abdomen/pelvis lung findings concerning for septic pulmonary emboli.  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 ID consulted.   Today, 04/15/2023 Day #6 vancomycin  Renal: AKI - SCr unstable 1.29 >> 2.35 >> 1.66 > 0.95 Afebrile/24h WBC 12.4 >> 16.9 >>13.2 > 10.2 2/4 blood culture: 3/4 bottles MRSA 2/5 Urine cx: NG Respiratory panel and respiratory virus panel neg 2/5 C difficile neg Rule out endocarditis with concern for septic pulmonary embolism on CT 2/5 ECHO: no endocarditis, TEE pending, PORT removed 2/9 2/7 repeat blood cultures: NG<24 hrs  Vancomycin  dosing/levels: Vancomycin  doses given 2/4 1gm IV x 1 at 2356 then 2/5 1500mg  IV  x1 2/6 random vancomycin  level at 0603 = 11 mcg/mL 2/6 vancomycin  1500mg  IV x 1 given at 0947, 2/7 random vancomycin  level 0553 = 16 mcg/mL 2/7 Vancomycin  1500 mg given 2/7@1046  2/8 vancomycin  1500 mg given 2/8 @0836  2/9 random vancomycin  level @0502  = 23 mcg/ml,  no dose ordered 2/10 random vancomycin  12. Last dose 2/8 @0836  (1500mg  )  Plan: Scr improved. Pt is still on IV lasix . Will order vancomycin  1500 mg q24H. Vancomycin  random ordered for 2/11. If random elevated, may need to adjust dose/frequency.  Note history of infusion related reaction with vancomycin  - slow infusion Anticipate  some difficulty dosing vancomycin  with PMH Bilateral AKA - effect on volume of distribution and SCr (loss of muscle mass).   Follow renal function closely daily CMP ordered until 2/12 Follow current cultures, ECHO, repeat blood  cultures  Daptomycin- not currently option with septic PE, Pneumonia  Temp (24hrs), Avg:98.2 F (36.8 C), Min:97.9 F (36.6 C), Max:98.7 F (37.1 C)   Recent Labs  Lab 04/09/23 2132 04/10/23 0127 04/10/23 0350 04/10/23 1051 04/11/23 0603 04/12/23 0550 04/12/23 0553 04/12/23 0743 04/13/23 0322 04/14/23 0502 04/15/23 0548  WBC  --   --   --  16.3*  --   --   --  12.4* 16.9* 13.2* 10.6*  CREATININE  --   --   --  3.11*   < > 1.35*  --  1.29* 2.35* 1.66* 0.95  LATICACIDVEN 1.6 1.5 1.5  --   --   --   --   --   --   --   --   VANCORANDOM  --   --   --   --    < >  --    < >  --   --  23 12   < > = values in this interval not displayed.    Estimated Creatinine Clearance: 87.1 mL/min (by C-G formula based on SCr of 0.95 mg/dL).    Allergies  Allergen Reactions   Bacitracin Hives   Vancomycin  Other (See Comments) and Rash    Red man syndrome.   Ketorolac     Other Reaction(s): Kidney Disorder  Other reaction(s): Kidney Disorder   Spironolactone     Other Reaction(s): Unknown  Other reaction(s): Unknown    Antimicrobials this admission: 2/4 Cefepime  >> x 1 dose 2/4 Vancomycin  >>    Thank you for allowing pharmacy to be a part of  this patient's care.  Harlie Lieu, PharmD Clinical Pharmacist 04/15/2023

## 2023-04-15 NOTE — Progress Notes (Signed)
 Date of Admission:  04/09/2023   T ID: [name] is a 68 y.o. male  Principal Problem:   HAP (hospital-acquired pneumonia) Active Problems:   Prostate cancer metastatic to bone [facility])   PE (pulmonary thromboembolism) (HCC)   Essential hypertension   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   Ogilvie syndrome   Severe sepsis (HCC)   AKI (acute kidney injury) (HCC)   MRSA bacteremia   Pulmonary infiltrates   Septic embolism (HCC)   Palliative care encounter   Acute respiratory failure with hypoxia (HCC)   Morbid obesity (HCC)    Subjective: Pt is feeling better No SOB   Medications:   acetaminophen   650 mg Oral Q6H   buPROPion   300 mg Oral Daily   Chlorhexidine  Gluconate Cloth  6 each Topical Daily   enoxaparin  (LOVENOX ) injection  1 mg/kg Subcutaneous Q12H   furosemide   40 mg Intravenous BID   gabapentin   200 mg Oral TID   lactulose   10 g Oral TID   leptospermum manuka honey  1 Application Topical Daily   lidocaine   1 patch Transdermal Q24H   nystatin  cream   Topical BID   polyethylene glycol  17 g Oral BID   [START ON 04/16/2023] predniSONE   30 mg Oral Q breakfast   predniSONE   50 mg Oral Once   Warfarin - Pharmacist Dosing Inpatient   Does not apply q1600    Objective: Vital signs in last 24 hours: Patient Vitals for the past 24 hrs:  BP Temp Temp src Pulse Resp SpO2  04/15/23 0832 109/66 98.1 F (36.7 C) Oral 71 20 96 %  04/15/23 0330 (!) 104/52 98.2 F (36.8 C) Oral 70 18 95 %  04/14/23 2336 112/78 97.9 F (36.6 C) Oral 67 18 95 %  04/14/23 2100 92/68 98 F (36.7 C) Oral 72 18 97 %  04/14/23 1700 91/65 97.9 F (36.6 C) Oral 86 -- --      PHYSICAL EXAM:  General:awake and alert, no resp distress Previous Port site covered with dressing Lungs: Bilateral air entry Heart: S1-S2. Abdomen: Soft, non-tender,not distended. Bowel sounds normal. No masses Extremities: Bilateral AKA  skin: No rashes or lesions. Or bruising Lymph:  Cervical, supraclavicular normal. Neurologic: Grossly non-focal  Lab Results    Latest Ref Rng & Units 04/15/2023    5:48 AM 04/14/2023    5:02 AM 04/13/2023    3:22 AM  CBC  WBC 4.0 - 10.5 K/uL 10.6  13.2  16.9   Hemoglobin 13.0 - 17.0 g/dL 86.5  78.4  69.6   Hematocrit 39.0 - 52.0 % 30.9  31.8  33.2   Platelets 150 - 400 K/uL 376  298  333        Latest Ref Rng & Units 04/15/2023    5:48 AM 04/14/2023    5:02 AM 04/13/2023    3:22 AM  CMP  Glucose 70 - 99 mg/dL 295  284  132   BUN 8 - 23 mg/dL 47  60  57   Creatinine 0.61 - 1.24 mg/dL 4.40  1.02  7.25   Sodium 135 - 145 mmol/L 138  134  134   Potassium 3.5 - 5.1 mmol/L 2.7  3.9  3.2   Chloride 98 - 111 mmol/L 104  104  100   CO2 22 - 32 mmol/L 24  20  22    Calcium 8.9 - 10.3 mg/dL 9.9  9.9  9.9   Total Protein 6.5 - 8.1  g/dL 6.4  6.5  6.7   Total Bilirubin 0.0 - 1.2 mg/dL 0.5  1.3  1.1   Alkaline Phos 38 - 126 U/L 60  65  65   AST 15 - 41 U/L 33  32  34   ALT 0 - 44 U/L 31  24  27        Microbiology: [facility] MRSA on 04/09/23 both sets 2/7 BC- NG So far 2/9 cath tip sent for culture  Studies/Results: IR REMOVAL TUN ACCESS W/ PORT W/O FL MOD SED Result Date: 04/14/2023 INDICATION: 68 year old male with a chronic indwelling port catheter which was placed by interventional radiology in September of 2024. Patient is currently admitted with MRSA bacteremia and pneumonia. He requires port catheter explantation. EXAM: REMOVAL RIGHT IJ VEIN PORT-A-CATH MEDICATIONS: None ANESTHESIA/SEDATION: None FLUOROSCOPY TIME:  None COMPLICATIONS: None immediate. PROCEDURE: Informed written consent was obtained from the patient after a thorough discussion of the procedural risks, benefits and alternatives. All questions were addressed. Maximal Sterile Barrier Technique was utilized including caps, mask, sterile gowns, sterile gloves, sterile drape, hand hygiene and skin antiseptic. A timeout was performed prior to the initiation of the procedure. The right  chest was prepped and draped in a sterile fashion. Lidocaine  was utilized for local anesthesia. An incision was made over the previously healed surgical incision. Utilizing blunt dissection, the port catheter and reservoir were removed from the underlying subcutaneous tissue in their entirety. Securing sutures were also removed. The pocket was irrigated with a copious amount of sterile normal saline. The pocket was closed with interrupted 3-0 Vicryl stitches. The subcutaneous tissue was closed with 3-0 Vicryl interrupted subcutaneous stitches. A 4-0 Vicryl running subcuticular stitch was utilized to approximate the skin. Dermabond was applied. IMPRESSION: Successful right IJ vein Port-A-Cath explantation. Electronically Signed   By: Fernando Hoyer M.D.   On: 04/14/2023 12:06     Assessment/Plan:  MRSA bacteremia With port in place and multiple nodules in the lungs concern for septic emboli and endocarditis of tricuspid valve  repeat blood culture from 2/7 NG PORT  removed today 04/14/23 Cath tip positive for MRSA 2 d echo TEE as resp status is stable On vancomycin  adjusted to crcl Watch closely because of AKI Daptomycin is currently not an option because of  lung infiltrate Will repeat blood culture again as port site was positive  Duration of antibiotic will depend on TEE result      AKI - resolved   Metastatic prostate ca with skeletal mets Chemo stopped due to severe phantom pain    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

## 2023-04-15 NOTE — Evaluation (Signed)
 Occupational Therapy Evaluation Patient Details Name: [name] MRN: [id number] DOB: [birth year] Today's Date: 04/15/2023   History of Present Illness Finnis Swalley is a 67yoM who comes [facility] on 04/09/23 c confusion, SOB. Was here in Jan with recurrent DVT. Pt admitted with sepsis 2/4 due to PNA. 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 Pt previously on hospice at home, but no longer interested in this. Anticoaglulation being held as of 2/8 for pending port removal and TEE.   Clinical Impression   Pt was seen for OT evaluation this date. Prior to hospital admission, pt was living at home alone where he has made his home accessible for his needs based on W/C level. Reports MOD I at baseline with all transfers and ADLs.  Pt presents to acute OT demonstrating impaired ADL performance and functional mobility 2/2 weakness and low activity tolerance (See OT problem list for additional functional deficits). Bed level eval required d/t pt's INR 1.9. Pt currently requires SUP to perform long sitting in bed. He was able to roll using bed rails with SUP and OT edu on proper positioning and pressure relief d/t c/o buttocks pain. Pt with lots of questions and concerns about his hospital bed at home, provided recommendations and  edu on importance of HH therapy coming in to see him at home to provide the best suggestions to maximize his IND at home.  Pt would benefit from skilled OT services to address noted impairments and functional limitations (see below for any additional details) in order to maximize safety and independence while minimizing falls risk and caregiver burden. Do anticipate the need for follow up OT services upon acute hospital DC.        If plan is discharge home, recommend the following: Assistance with cooking/housework;Help with stairs or ramp for entrance;Assist for transportation;A little help with  walking and/or transfers;A little help with bathing/dressing/bathroom    Functional Status Assessment  Patient has had a recent decline in their functional status and demonstrates the ability to make significant improvements in function in a reasonable and predictable amount of time.  Equipment Recommendations  None recommended by OT    Recommendations for Other Services       Precautions / Restrictions Precautions Precautions: Fall Precaution Comments: B AKA Restrictions Weight Bearing Restrictions Per Provider Order: No Other Position/Activity Restrictions: B AKA      Mobility Bed Mobility Overal bed mobility: Needs Assistance   Rolling: Used rails, Supervision         General bed mobility comments: SUP to roll to position pillows under buttocks for pressure relief; able to long sit in bed x2 during eval using bed rails with SUP/MOD I    Transfers                   General transfer comment: not medically stable for transfer this date      Balance Overall balance assessment: Needs assistance Sitting-balance support: Bilateral upper extremity supported Sitting balance-Leahy Scale: Fair Sitting balance - Comments: able to long sit in bed holding bed rails                                   ADL either performed or assessed with clinical judgement   ADL Overall ADL's : Needs assistance/impaired  Toilet Transfer Details (indicate cue type and reason): anticipate Min/CGA; however eval limited to bed level d/t low INR           General ADL Comments: able to move in bed fairly well; may need some assist with transfers d/t weakness     Vision         Perception         Praxis         Pertinent Vitals/Pain Pain Assessment Pain Assessment: 0-10 Pain Score: 5  Pain Location: buttocks Pain Descriptors / Indicators: Tender, Sore Pain Intervention(s): Repositioned, Limited activity within patient's  tolerance, Monitored during session     Extremity/Trunk Assessment Upper Extremity Assessment Upper Extremity Assessment: Overall WFL for tasks assessed   Lower Extremity Assessment Lower Extremity Assessment: Overall WFL for tasks assessed       Communication Communication Communication: No apparent difficulties Cueing Techniques: Verbal cues   Cognition Arousal: Alert Behavior During Therapy: WFL for tasks assessed/performed Overall Cognitive Status: Within Functional Limits for tasks assessed                                       General Comments       Exercises Other Exercises Other Exercises: Edu on role of OT in acute setting and his need for [facility] therapy to come in to assist with home modifications and suggestions for ease with bed mobility at home. Extensive edu/time spent on discussing options with pt. Other Exercises: Provided active listening to pt based on his emotional state of wanting to return home to be with his dog and that he is too weak right now.   Shoulder Instructions      Home Living Family/patient expects to be discharged to:: Private residence Living Arrangements: Alone Available Help at Discharge: Friend(s);Available PRN/intermittently Type of Home: House Home Access: Ramped entrance;Other (comment)     Home Layout: Two level;Able to live on main level with bedroom/bathroom Alternate Level Stairs-Number of Steps: has stair lift chair   Bathroom Shower/Tub: Walk-in shower   Bathroom Toilet: Handicapped height     Home Equipment: Shower seat;Grab bars - toilet;Grab bars - tub/shower;Wheelchair - Engineer, technical sales - power;Hospital bed   Additional Comments: Home is wheelchair accessible throughout; has Life alert.      Prior Functioning/Environment Prior Level of Function : Independent/Modified Independent             Mobility Comments: Modified independent w/c level; has a gym in his garage ADLs Comments: Modified  independent.  Does 180 degree turn in power w/c before transferring to toilet (has multiple grab bars to use)        OT Problem List: Decreased strength;Decreased activity tolerance;Obesity;Impaired balance (sitting and/or standing)      OT Treatment/Interventions: Self-care/ADL training;Therapeutic exercise;Therapeutic activities;DME and/or AE instruction;Patient/family education;Balance training    OT Goals(Current goals can be found in the care plan section) Acute Rehab OT Goals Patient Stated Goal: improve weakness to return home OT Goal Formulation: With patient Time For Goal Achievement: 04/29/23 Potential to Achieve Goals: Good ADL Goals Pt Will Perform Lower Body Bathing: with modified independence;sitting/lateral leans;bed level Pt Will Perform Lower Body Dressing: with modified independence;sitting/lateral leans;bed level Pt Will Transfer to Toilet: bedside commode;anterior/posterior transfer;with transfer board;with supervision Pt Will Perform Toileting - Clothing Manipulation and hygiene: with modified independence;sitting/lateral leans;bed level  OT Frequency: Min 1X/week    Co-evaluation  AM-PAC OT "6 Clicks" Daily Activity     Outcome Measure     Help from another person toileting, which includes using toliet, bedpan, or urinal?: A Little Help from another person bathing (including washing, rinsing, drying)?: A Little Help from another person to put on and taking off regular upper body clothing?: None Help from another person to put on and taking off regular lower body clothing?: A Little 6 Click Score: 13   End of Session Nurse Communication: Mobility status  Activity Tolerance: Patient tolerated treatment well Patient left: in bed;with call bell/phone within reach;with bed alarm set  OT Visit Diagnosis: Other abnormalities of gait and mobility (R26.89);Muscle weakness (generalized) (M62.81)                Time: 6045-4098 OT Time Calculation  (min): 36 min Charges:  OT General Charges $OT Visit: 1 Visit OT Evaluation $OT Eval Moderate Complexity: 1 Mod OT Treatments $Therapeutic Activity: 8-22 mins Aleria Maheu, OTR/L  04/15/23, 12:58 PM  Autym Siess E Reyden Smith 04/15/2023, 12:47 PM

## 2023-04-15 NOTE — Progress Notes (Addendum)
 PROGRESS NOTE    [name]  ZOX:[id number] DOB: 11-Mar-1955 DOA: 04/09/2023 PCP: Baltazar Leventhal, MD  Chief Complaint  Patient presents with   Shortness of Breath    Hospital Course:  [name] is 68 y.o. male with stage IV prostate cancer, recurrent VTE, hypertension, PVD status post AKA, decubitus ulcers, who was admitted on 1/18 with shortness of breath and was found to have recurrent DVT despite Eliquis  and then Coumadin  (not technically a failure as he was not bridged.)  He had been unable to afford Lovenox  as an outpatient.  During hospitalization he was noted to have volume overload and was diuresed.  Stay was also complicated by ileus for stool impaction that was treated aggressively with a bowel regimen.  He was discharged in stable condition on 1/31.  He returned to the ED on 2/4 due to increasing confusion and shortness of breath.  He was found to be saturating 89% on room air.  EMS placed patient on CPAP prior to arrival.  In the ED he was found to be septic with fever of 101.9, respiratory rate 31, leukocytosis 20K, and hypoxia.  Chest x-ray revealed left basilar opacity.  He was placed on BiPAP due to work of breathing and hypoxemia.  He failed weaning trial due to work of breathing though he did have good oxygen saturations.  He was admitted for management of healthcare associated pneumonia and sepsis.  Subjective: No acute events overnight.  Patient is much improved this morning.  Back pain is persistent but better.  He is no longer disoriented.  Objective: Vitals:   04/14/23 2100 04/14/23 2336 04/15/23 0330 04/15/23 0832  BP: 92/68 112/78 (!) 104/52 109/66  Pulse: 72 67 70 71  Resp: 18 18 18 20   Temp: 98 F (36.7 C) 97.9 F (36.6 C) 98.2 F (36.8 C) 98.1 F (36.7 C)  TempSrc: Oral Oral Oral Oral  SpO2: 97% 95% 95% 96%  Weight:      Height:        Intake/Output Summary (Last 24 hours) at 04/15/2023 0852 Last data filed at 04/15/2023  0414 Gross per 24 hour  Intake 240 ml  Output 2350 ml  Net -2110 ml   Filed Weights   04/09/23 2019  Weight: 136 kg    Examination: General exam: NAD, calm Respiratory system: Shortened expiratory phase, 2L [place], CTAB Cardiovascular system: S1 & S2 heard, RRR.  Gastrointestinal system: Abdomen is soft, nontender. Neuro: Awake, alert and oriented x 3 Extremities: Bilateral AKA  Skin: No rashes, lesions Psychiatry: Calm, mood and affect appropriate for situation  Assessment & Plan:  Principal Problem:   HAP (hospital-acquired pneumonia) Active Problems:   Severe sepsis (HCC)   PE (pulmonary thromboembolism) (HCC)   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   Ogilvie syndrome   AKI (acute kidney injury) (HCC)   Prostate cancer metastatic to bone [facility])   Essential hypertension   MRSA bacteremia   Pulmonary infiltrates   Septic embolism (HCC)   Palliative care encounter   Acute respiratory failure with hypoxia (HCC)   Morbid obesity (HCC)    Severe sepsis - Criteria met with hypoxemia, fever, AKI, leukocytosis, tachypnea.  Source: Pneumonia and bacteremia - Antibiotics as below  MRSA bacteremia - Patient is on vancomycin  - ID consulted - On arrival port in place and there are multiple nodules in the lungs concerning for septic emboli - Echocardiogram without evidence of endocarditis, needs a TEE. Respiratory status much improved now and  can likely tolerate this now. - Cardiology planning for TEE tomorrow.  N.p.o. at midnight. - Port removed 2/9.  Tip culture positive for staph  Healthcare associated pneumonia +/- septic emboli Hypoxic respiratory failure - Respiratory status much improved now.  Has required BiPAP multiple times throughout this admission.  Has now been successfully weaned to 2 L. Respiratory failure secondary to pneumonia, septic emboli, and then pulmonary edema -- Started on BID Lasix  2/8 and has significant improvement.  - Has been on  prednisone , begin tapering 2/10. - Procalcitonin: 5.8 - Leukocytosis complicated by steroid use.  AKI - Baseline creatinine 0.98, creatinine 3.89 on arrival - Lasix  was added and creatinine has improved significantly.  Is now back to baseline -- ID to consider linezolid  if needed, but currently patient appears to be tolerating vancomycin . - Status post IVF. -- Strict Ins and Outs.   Pulmonary embolism Supratherapeutic INR - Has had recurrent VTEs on Eliquis , now on warfarin therapy --  Received low-dose vitamin K  2/7 for INR 4.5, needed port removal. Avoid high-dose vitamin K as he is very high risk for coagulopathy given his sepsis, malignancy, and history of recurrent VTE -- INR now <2, pharm consult to bridge with lovenox  -- Pharmacy to manage Warfarin dosing  HF preserved EF, with acute exacerbation - On arrival BNP 110, and patient did not appear acutely volume overloaded.  He was septic and received some IV fluids.  Suspect he became overloaded during that time. - He has now been started on IV diuresis and has put out Over 12 L since admission. - ECHO: EF 55-60%, no RWMA, mildly dilated LV, diastolic parameters indeterminate - Takes Daily 20mg  lasix  at home  Hypokalemia - Secondary to IV Lasix  - Continue to replace daily.  Prostate cancer metastatic to bone - Follows with oncology outpatient, currently holding treatment in setting of sepsis - Port removal as above - Palliative consult - Patient was on hospice outpatient but he misunderstood the services.  He is not interested in hospice at this time  Hypertension - Continue home meds  Back and flank pain - Suspect this is secondary to his multiple osseous lesions.  Continue with as needed Dilaudid  and Percocet.  Scheduled Tylenol  and lidocaine   History of Ogilivie syndrome - Has had bowel movements this admission, none today. 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  Further complicated by hypoxia, sepsis - Reinforce sleep-wake cycle - Frequent reorientation - Delirium precautions  Upper extremity weakness  - Worsening since admission, perhaps related to azotemia vs deconditioning vs delirium. PT/Ot consulted  DVT prophylaxis: Warfarin   Code Status: Limited: Do not attempt resuscitation (DNR) -DNR-LIMITED -Do Not Intubate/DNI  Family Communication: discussed directly with patient Disposition:  Status is: Inpatient, workup ongoing.  Currently necessitates IV antibiotics. TEE tomorrow. Likely eventual DC to SNF    Consultants:  ID Cardiology (TEE) IR (port removal)  Procedures:    Antimicrobials:  Anti-infectives (From admission, onward)    Start     Dose/Rate Route Frequency Ordered Stop   04/15/23 0800  vancomycin  (VANCOREADY) IVPB 1500 mg/300 mL  1,500 mg 100 mL/hr over 180 Minutes Intravenous Every 24 hours 04/15/23 0649     04/13/23 1030  vancomycin  variable dose per unstable renal function (pharmacist dosing)  Status:  Discontinued         Does not apply See admin instructions 04/13/23 1031 04/15/23 0711   04/12/23 1000  vancomycin  (VANCOREADY) IVPB 1500 mg/300 mL  Status:  Discontinued        1,500 mg 100 mL/hr over 180 Minutes Intravenous Every 24 hours 04/12/23 0831 04/13/23 1031   04/11/23 1000  vancomycin  (VANCOREADY) IVPB 1500 mg/300 mL        1,500 mg 75 mL/hr over 240 Minutes Intravenous  Once 04/11/23 0828 04/11/23 1358   04/10/23 0308  vancomycin  variable dose per unstable renal function (pharmacist dosing)  Status:  Discontinued         Does not apply See admin instructions 04/10/23  0308 04/12/23 0831   04/10/23 0145  vancomycin  (VANCOREADY) IVPB 1500 mg/300 mL        1,500 mg 100 mL/hr over 180 Minutes Intravenous  Once 04/10/23 0131 04/10/23 0546   04/10/23 0130  vancomycin  (VANCOCIN ) IVPB 1000 mg/200 mL premix  Status:  Discontinued        1,000 mg 200 mL/hr over 60 Minutes Intravenous  Once 04/10/23 0120 04/10/23 0131   04/09/23 2100  vancomycin  (VANCOCIN ) IVPB 1000 mg/200 mL premix        1,000 mg 200 mL/hr over 60 Minutes Intravenous  Once 04/09/23 2047 04/10/23 0113   04/09/23 2100  ceFEPIme  (MAXIPIME ) 2 g in sodium chloride  0.9 % 100 mL IVPB        2 g 200 mL/hr over 30 Minutes Intravenous  Once 04/09/23 2047 04/10/23 0113       Data Reviewed: I have personally reviewed following labs and imaging studies CBC: Recent Labs  Lab 04/10/23 1051 04/12/23 0743 04/13/23 0322 04/14/23 0502 04/15/23 0548  WBC 16.3* 12.4* 16.9* 13.2* 10.6*  NEUTROABS 15.7* 11.4* 15.0* 11.7* 8.9*  HGB 10.8* 10.6* 10.8* 10.6* 10.5*  HCT 31.4* 31.7* 33.2* 31.8* 30.9*  MCV 91.3 92.4 96.0 93.0 90.1  PLT 229 274 333 298 376   Basic Metabolic Panel: Recent Labs  Lab 04/11/23 0603 04/12/23 0550 04/12/23 0743 04/13/23 0322 04/14/23 0502 04/15/23 0548  NA 133*  --  136 134* 134* 138  K 2.8*  --  2.8* 3.2* 3.9 2.7*  CL 100  --  104 100 104 104  CO2 19*  --  22 22 20* 24  GLUCOSE 167*  --  139* 125* 145* 116*  BUN 61*  --  54* 57* 60* 47*  CREATININE 2.10* 1.35* 1.29* 2.35* 1.66* 0.95  CALCIUM 9.8  --  10.2 9.9 9.9 9.9  MG  --   --  1.9 1.9 1.9 1.9  PHOS  --   --  2.1* 3.4 3.4 2.7   GFR: Estimated Creatinine Clearance: 87.1 mL/min (by C-G formula based on SCr of 0.95 mg/dL). Liver Function Tests: Recent Labs  Lab 04/10/23 1051 04/12/23 0743 04/13/23 0322 04/14/23 0502 04/15/23 0548  AST 44* 25 34 32 33  ALT 22 25 27 24 31   ALKPHOS 67 58 65 65 60  BILITOT 2.1* 0.6 1.1 1.3* 0.5  PROT 6.9 6.8 6.7 6.5 6.4*  ALBUMIN  3.3* 3.1* 3.3* 3.0* 2.8*   CBG: No results  for input(s): "GLUCAP" in the last 168 hours.   Recent Results (from the past 240 hours)  Resp panel by RT-PCR (RSV, Flu  A&B, Covid) Anterior Nasal Swab     Status: None   Collection Time: 04/09/23  8:19 PM   Specimen: Anterior Nasal Swab  Result Value Ref Range Status   SARS Coronavirus 2 by RT PCR NEGATIVE NEGATIVE Final    Comment: (NOTE) SARS-CoV-2 target nucleic acids are NOT DETECTED.  The SARS-CoV-2 RNA is generally detectable in upper respiratory specimens during the acute phase of infection. The lowest concentration of SARS-CoV-2 viral copies this assay can detect is 138 copies/mL. A negative result does not preclude SARS-Cov-2 infection and should not be used as the sole basis for treatment or other patient management decisions. A negative result may occur with  improper specimen collection/handling, submission of specimen other than nasopharyngeal swab, presence of viral mutation(s) within the areas targeted by this assay, and inadequate number of viral copies(<138 copies/mL). A negative result must be combined with clinical observations, patient history, and epidemiological information. The expected result is Negative.  Fact Sheet for Patients:  BloggerCourse.com  Fact Sheet for Healthcare Providers:  SeriousBroker.it  This test is no t yet approved or cleared by the [place]  FDA and  has been authorized for detection and/or diagnosis of SARS-CoV-2 by FDA under an Emergency Use Authorization (EUA). This EUA will remain  in effect (meaning this test can be used) for the duration of the COVID-19 declaration under Section 564(b)(1) of the Act, 21 U.S.C.section 360bbb-3(b)(1), unless the authorization is terminated  or revoked sooner.       Influenza A by PCR NEGATIVE NEGATIVE Final   Influenza B by PCR NEGATIVE NEGATIVE Final    Comment: (NOTE) The Xpert Xpress SARS-CoV-2/FLU/RSV plus assay is intended as an  aid in the diagnosis of influenza from Nasopharyngeal swab specimens and should not be used as a sole basis for treatment. Nasal washings and aspirates are unacceptable for Xpert Xpress SARS-CoV-2/FLU/RSV testing.  Fact Sheet for Patients: BloggerCourse.com  Fact Sheet for Healthcare Providers: SeriousBroker.it  This test is not yet approved or cleared by the [place]  FDA and has been authorized for detection and/or diagnosis of SARS-CoV-2 by FDA under an Emergency Use Authorization (EUA). This EUA will remain in effect (meaning this test can be used) for the duration of the COVID-19 declaration under Section 564(b)(1) of the Act, 21 U.S.C. section 360bbb-3(b)(1), unless the authorization is terminated or revoked.     Resp Syncytial Virus by PCR NEGATIVE NEGATIVE Final    Comment: (NOTE) Fact Sheet for Patients: BloggerCourse.com  Fact Sheet for Healthcare Providers: SeriousBroker.it  This test is not yet approved or cleared by the [place]  FDA and has been authorized for detection and/or diagnosis of SARS-CoV-2 by FDA under an Emergency Use Authorization (EUA). This EUA will remain in effect (meaning this test can be used) for the duration of the COVID-19 declaration under Section 564(b)(1) of the Act, 21 U.S.C. section 360bbb-3(b)(1), unless the authorization is terminated or revoked.  Performed at [facility], [address] [place], [place] [address]   Blood Culture (routine x 2)     Status: Abnormal (Preliminary result)   Collection Time: 04/09/23  8:19 PM   Specimen: Right Antecubital; Blood  Result Value Ref Range Status   Specimen Description   Final    RIGHT ANTECUBITAL BLOOD Performed at [facility], [address], [place], [place] [address]    Special Requests   Final    BOTTLES DRAWN AEROBIC AND ANAEROBIC Blood Culture results may  not be optimal due to an inadequate  volume of blood received in culture bottles Performed at [facility], [address] [place], [place] [address]    Culture  Setup Time   Final    GRAM POSITIVE COCCI IN BOTH AEROBIC AND ANAEROBIC BOTTLES CRITICAL RESULT CALLED TO, READ BACK BY AND VERIFIED WITH: TREY GREENWOOD PHARMD 1137 04/10/23 HNM GRAM STAIN REVIEWED-AGREE WITH RESULT DRT    Culture (A)  Final    METHICILLIN RESISTANT STAPHYLOCOCCUS AUREUS Sent to [facility] for further susceptibility testing. Performed at [facility], [address], [place], [place] [address]    Report Status PENDING  Incomplete   Organism ID, Bacteria METHICILLIN RESISTANT STAPHYLOCOCCUS AUREUS  Final      Susceptibility   Methicillin resistant staphylococcus aureus - MIC*    CIPROFLOXACIN >=8 RESISTANT Resistant     ERYTHROMYCIN >=8 RESISTANT Resistant     GENTAMICIN <=0.5 SENSITIVE Sensitive     OXACILLIN >=4 RESISTANT Resistant     TETRACYCLINE <=1 SENSITIVE Sensitive     VANCOMYCIN  1 SENSITIVE Sensitive     TRIMETH /SULFA  <=10 SENSITIVE Sensitive     CLINDAMYCIN <=0.25 SENSITIVE Sensitive     RIFAMPIN <=0.5 SENSITIVE Sensitive     Inducible Clindamycin NEGATIVE Sensitive     LINEZOLID  2 SENSITIVE Sensitive     * METHICILLIN RESISTANT STAPHYLOCOCCUS AUREUS  Blood Culture (routine x 2)     Status: Abnormal   Collection Time: 04/09/23  8:19 PM   Specimen: BLOOD RIGHT FOREARM  Result Value Ref Range Status   Specimen Description   Final    BLOOD RIGHT FOREARM Performed at [facility], [address], [place], [place] [address]    Special Requests   Final    BOTTLES DRAWN AEROBIC AND ANAEROBIC Blood Culture results may not be optimal due to an inadequate volume of blood received in culture bottles Performed at [facility], [address] [place], [place] [address]    Culture  Setup Time   Final    GRAM POSITIVE COCCI AEROBIC BOTTLE ONLY CRITICAL VALUE NOTED.   VALUE IS CONSISTENT WITH PREVIOUSLY REPORTED AND CALLED VALUE. GRAM STAIN REVIEWED-AGREE WITH RESULT DRT    Culture (A)  Final    STAPHYLOCOCCUS AUREUS SUSCEPTIBILITIES PERFORMED ON PREVIOUS CULTURE WITHIN THE LAST 5 DAYS. Performed at [facility], [address], [place], [place] [address]    Report Status 04/12/2023 FINAL  Final  Blood Culture ID Panel (Reflexed)     Status: Abnormal   Collection Time: 04/09/23  8:19 PM  Result Value Ref Range Status   Enterococcus faecalis NOT DETECTED NOT DETECTED Final   Enterococcus Faecium NOT DETECTED NOT DETECTED Final   Listeria monocytogenes NOT DETECTED NOT DETECTED Final   Staphylococcus species DETECTED (A) NOT DETECTED Final    Comment: CRITICAL RESULT CALLED TO, READ BACK BY AND VERIFIED WITH: Marguerite Shiley PHARMD 1137 04/10/23 HNM    Staphylococcus aureus (BCID) DETECTED (A) NOT DETECTED Final    Comment: Methicillin (oxacillin)-resistant Staphylococcus aureus (MRSA). MRSA is predictably resistant to beta-lactam antibiotics (except ceftaroline). Preferred therapy is vancomycin  unless clinically contraindicated. Patient requires contact precautions if  hospitalized. CRITICAL RESULT CALLED TO, READ BACK BY AND VERIFIED WITH: Marguerite Shiley PHARMD 1137 04/10/23 HNM    Staphylococcus epidermidis NOT DETECTED NOT DETECTED Final   Staphylococcus lugdunensis NOT DETECTED NOT DETECTED Final   Streptococcus species NOT DETECTED NOT DETECTED Final   Streptococcus agalactiae NOT DETECTED NOT DETECTED Final   Streptococcus pneumoniae NOT DETECTED NOT DETECTED Final  Streptococcus pyogenes NOT DETECTED NOT DETECTED Final   A.calcoaceticus-baumannii NOT DETECTED NOT DETECTED Final   Bacteroides fragilis NOT DETECTED NOT DETECTED Final   Enterobacterales NOT DETECTED NOT DETECTED Final   Enterobacter cloacae complex NOT DETECTED NOT DETECTED Final   Escherichia coli NOT DETECTED NOT DETECTED Final   Klebsiella aerogenes NOT DETECTED NOT  DETECTED Final   Klebsiella oxytoca NOT DETECTED NOT DETECTED Final   Klebsiella pneumoniae NOT DETECTED NOT DETECTED Final   Proteus species NOT DETECTED NOT DETECTED Final   Salmonella species NOT DETECTED NOT DETECTED Final   Serratia marcescens NOT DETECTED NOT DETECTED Final   Haemophilus influenzae NOT DETECTED NOT DETECTED Final   Neisseria meningitidis NOT DETECTED NOT DETECTED Final   Pseudomonas aeruginosa NOT DETECTED NOT DETECTED Final   Stenotrophomonas maltophilia NOT DETECTED NOT DETECTED Final   Candida albicans NOT DETECTED NOT DETECTED Final   Candida auris NOT DETECTED NOT DETECTED Final   Candida glabrata NOT DETECTED NOT DETECTED Final   Candida krusei NOT DETECTED NOT DETECTED Final   Candida parapsilosis NOT DETECTED NOT DETECTED Final   Candida tropicalis NOT DETECTED NOT DETECTED Final   Cryptococcus neoformans/gattii NOT DETECTED NOT DETECTED Final   Meth resistant mecA/C and MREJ DETECTED (A) NOT DETECTED Final    Comment: CRITICAL RESULT CALLED TO, READ BACK BY AND VERIFIED WITHMarguerite Shiley PHARMD 1137 04/10/23 HNM Performed at [facility], [address] [place], [place] [address]   Respiratory (~20 pathogens) panel by PCR     Status: None   Collection Time: 04/10/23  1:27 AM   Specimen: Nasopharyngeal Swab; Respiratory  Result Value Ref Range Status   Adenovirus NOT DETECTED NOT DETECTED Final   Coronavirus 229E NOT DETECTED NOT DETECTED Final    Comment: (NOTE) The Coronavirus on the Respiratory Panel, DOES NOT test for the novel  Coronavirus (2019 nCoV)    Coronavirus HKU1 NOT DETECTED NOT DETECTED Final   Coronavirus NL63 NOT DETECTED NOT DETECTED Final   Coronavirus OC43 NOT DETECTED NOT DETECTED Final   Metapneumovirus NOT DETECTED NOT DETECTED Final   Rhinovirus / Enterovirus NOT DETECTED NOT DETECTED Final   Influenza A NOT DETECTED NOT DETECTED Final   Influenza B NOT DETECTED NOT DETECTED Final   Parainfluenza Virus 1 NOT  DETECTED NOT DETECTED Final   Parainfluenza Virus 2 NOT DETECTED NOT DETECTED Final   Parainfluenza Virus 3 NOT DETECTED NOT DETECTED Final   Parainfluenza Virus 4 NOT DETECTED NOT DETECTED Final   Respiratory Syncytial Virus NOT DETECTED NOT DETECTED Final   Bordetella pertussis NOT DETECTED NOT DETECTED Final   Bordetella Parapertussis NOT DETECTED NOT DETECTED Final   Chlamydophila pneumoniae NOT DETECTED NOT DETECTED Final   Mycoplasma pneumoniae NOT DETECTED NOT DETECTED Final    Comment: Performed at [facility], [address] [place], [place] [address]  Urine Culture     Status: None   Collection Time: 04/10/23  1:27 AM   Specimen: Urine, Random  Result Value Ref Range Status   Specimen Description   Final    URINE, RANDOM Performed at [facility], [address] [place], [place] [address]    Special Requests   Final    NONE Reflexed from [id number] Performed at [facility], [address], [place], [place] [address]    Culture   Final    NO GROWTH Performed at [facility], [address], [place], [place] [address]    Report Status 04/11/2023 FINAL  Final  C Difficile Quick Screen w PCR reflex     Status: None   Collection Time: 04/10/23  2:00 PM   Specimen: STOOL  Result Value Ref Range Status   C Diff antigen NEGATIVE NEGATIVE Final   C Diff toxin NEGATIVE NEGATIVE Final   C Diff interpretation No C. difficile detected.  Final    Comment: Performed at [facility], [address] [place], [place] [address]  MRSA Next Gen by PCR, Nasal     Status: Abnormal   Collection Time: 04/10/23  7:18 PM   Specimen: Nasal Mucosa; Nasal Swab  Result Value Ref Range Status   MRSA by PCR Next Gen DETECTED (A) NOT DETECTED Final    Comment: RESULT CALLED TO, READ BACK BY AND VERIFIED WITH:  JACOB MOORE AT 2346 04/10/23 JG (NOTE) The GeneXpert MRSA Assay (FDA approved for NASAL specimens only), is one component of a comprehensive MRSA  colonization surveillance program. It is not intended to diagnose MRSA infection nor to guide or monitor treatment for MRSA infections. Test performance is not FDA approved in patients less than 63 years old. Performed at [facility], [address] [place], [place] [address]   Culture, blood (Routine X 2) w Reflex to ID Panel     Status: None (Preliminary result)   Collection Time: 04/12/23  5:00 AM   Specimen: BLOOD  Result Value Ref Range Status   Specimen Description BLOOD UNKNOWN  Final   Special Requests   Final    BOTTLES DRAWN AEROBIC AND ANAEROBIC Blood Culture adequate volume   Culture   Final    NO GROWTH 3 DAYS Performed at [facility], [address], [place], [place] [address]    Report Status PENDING  Incomplete  Culture, blood (Routine X 2) w Reflex to ID Panel     Status: None (Preliminary result)   Collection Time: 04/12/23  1:35 PM   Specimen: BLOOD  Result Value Ref Range Status   Specimen Description BLOOD BLOOD LEFT FOREARM  Final   Special Requests   Final    BOTTLES DRAWN AEROBIC AND ANAEROBIC Blood Culture adequate volume   Culture   Final    NO GROWTH 3 DAYS Performed at [facility], [address] [place], [place] [address]    Report Status PENDING  Incomplete  Cath Tip Culture     Status: None (Preliminary result)   Collection Time: 04/14/23 12:00 PM   Specimen: Catheter Tip; Other  Result Value Ref Range Status   Specimen Description   Final    CATH TIP Performed at [facility], [address], [place], [place] [address]    Special Requests   Final    NONE Performed at [facility], [address] [place], [place] [address]    Culture   Final    CULTURE REINCUBATED FOR BETTER GROWTH Performed at [place] [facility], [address], [place], [place] [address]    Report Status PENDING  Incomplete     Radiology Studies: IR REMOVAL TUN ACCESS W/ PORT W/O FL MOD SED Result Date:  04/14/2023 INDICATION: 68 year old male with a chronic indwelling port catheter which was placed by interventional radiology in September of 2024. Patient is currently admitted with MRSA bacteremia and pneumonia. He requires port catheter explantation. EXAM: REMOVAL RIGHT IJ VEIN PORT-A-CATH MEDICATIONS: None ANESTHESIA/SEDATION: None FLUOROSCOPY TIME:  None COMPLICATIONS: None immediate. PROCEDURE: Informed written consent was obtained from the patient after a thorough  discussion of the procedural risks, benefits and alternatives. All questions were addressed. Maximal Sterile Barrier Technique was utilized including caps, mask, sterile gowns, sterile gloves, sterile drape, hand hygiene and skin antiseptic. A timeout was performed prior to the initiation of the procedure. The right chest was prepped and draped in a sterile fashion. Lidocaine  was utilized for local anesthesia. An incision was made over the previously healed surgical incision. Utilizing blunt dissection, the port catheter and reservoir were removed from the underlying subcutaneous tissue in their entirety. Securing sutures were also removed. The pocket was irrigated with a copious amount of sterile normal saline. The pocket was closed with interrupted 3-0 Vicryl stitches. The subcutaneous tissue was closed with 3-0 Vicryl interrupted subcutaneous stitches. A 4-0 Vicryl running subcuticular stitch was utilized to approximate the skin. Dermabond was applied. IMPRESSION: Successful right IJ vein Port-A-Cath explantation. Electronically Signed   By: Fernando Hoyer M.D.   On: 04/14/2023 12:06   DG Chest Port 1 View Result Date: 04/13/2023 CLINICAL DATA:  Dyspnea EXAM: PORTABLE CHEST - 1 VIEW COMPARISON:  04/12/2023 FINDINGS: Unchanged cardiomegaly and mild pulmonary vascular congestion. Interval worsening of bilateral basilar airspace opacities. Small bilateral pleural effusions, appears slightly larger on the current exam. Postsurgical changes of the  cervical spine are partially visualized. Right IJ central venous catheter unchanged in position terminating near the cavoatrial junction. IMPRESSION: 1. Unchanged cardiomegaly and pulmonary vascular congestion. 2. Interval worsening of minimal bilateral pleural effusions. Interval worsening of bibasilar airspace opacities likely combination of pulmonary edema and atelectasis. Electronically Signed   By: Elester Grim M.D.   On: 04/13/2023 10:51    Scheduled Meds:  acetaminophen   650 mg Oral Q6H   buPROPion   300 mg Oral Daily   Chlorhexidine  Gluconate Cloth  6 each Topical Daily   enoxaparin  (LOVENOX ) injection  1 mg/kg Subcutaneous Q12H   furosemide   40 mg Intravenous BID   gabapentin   200 mg Oral TID   lactulose   10 g Oral TID   leptospermum manuka honey  1 Application Topical Daily   lidocaine   1 patch Transdermal Q24H   nystatin  cream   Topical BID   polyethylene glycol  17 g Oral BID   potassium chloride   40 mEq Oral Once   predniSONE   50 mg Oral Q breakfast   Warfarin - Pharmacist Dosing Inpatient   Does not apply q1600   Continuous Infusions:  potassium chloride      vancomycin          LOS: 5 days    Time spent:  55min  [name] Abdallah, DO [facility]  To contact the attending physician between 7A-7P please use Epic Chat. To contact the covering physician during after hours 7P-7A, please review Amion.   04/15/2023, 8:52 AM   *This document has been created with the assistance of dictation software. Please excuse typographical errors. *

## 2023-04-16 ENCOUNTER — Inpatient Hospital Stay: Payer: Medicare PPO | Admitting: Anesthesiology

## 2023-04-16 ENCOUNTER — Inpatient Hospital Stay
Admit: 2023-04-16 | Discharge: 2023-04-16 | Disposition: A | Discharge status: Home / Self Care | Payer: Medicare PPO | Attending: Student | Admitting: Student

## 2023-04-16 ENCOUNTER — Other Ambulatory Visit: Payer: Self-pay

## 2023-04-16 ENCOUNTER — Encounter
Admission: EM | Disposition: A | Discharge status: Home Health | Payer: Self-pay | Source: Home / Self Care | Attending: Family Medicine

## 2023-04-16 DIAGNOSIS — J189 Pneumonia, unspecified organism: Secondary | ICD-10-CM | POA: Diagnosis not present

## 2023-04-16 DIAGNOSIS — R7881 Bacteremia: Secondary | ICD-10-CM | POA: Diagnosis not present

## 2023-04-16 DIAGNOSIS — I76 Septic arterial embolism: Secondary | ICD-10-CM | POA: Diagnosis not present

## 2023-04-16 DIAGNOSIS — Y95 Nosocomial condition: Secondary | ICD-10-CM | POA: Diagnosis not present

## 2023-04-16 DIAGNOSIS — J9601 Acute respiratory failure with hypoxia: Secondary | ICD-10-CM | POA: Diagnosis not present

## 2023-04-16 DIAGNOSIS — B9562 Methicillin resistant Staphylococcus aureus infection as the cause of diseases classified elsewhere: Secondary | ICD-10-CM | POA: Diagnosis not present

## 2023-04-16 HISTORY — PX: TEE WITHOUT CARDIOVERSION: SHX5443

## 2023-04-16 LAB — CBC WITH DIFFERENTIAL/PLATELET
Abs Immature Granulocytes: 0.38 10*3/uL — ABNORMAL HIGH (ref 0.00–0.07)
Abs Immature Granulocytes: 0.48 10*3/uL — ABNORMAL HIGH (ref 0.00–0.07)
Basophils Absolute: 0 10*3/uL (ref 0.0–0.1)
Basophils Absolute: 0 10*3/uL (ref 0.0–0.1)
Basophils Relative: 0 %
Basophils Relative: 0 %
Eosinophils Absolute: 0 10*3/uL (ref 0.0–0.5)
Eosinophils Absolute: 0 10*3/uL (ref 0.0–0.5)
Eosinophils Relative: 0 %
Eosinophils Relative: 0 %
HCT: 34.7 % — ABNORMAL LOW (ref 39.0–52.0)
HCT: 34.2 % — ABNORMAL LOW (ref 39.0–52.0)
Hemoglobin: 11.4 g/dL — ABNORMAL LOW (ref 13.0–17.0)
Hemoglobin: 11.5 g/dL — ABNORMAL LOW (ref 13.0–17.0)
Immature Granulocytes: 4 %
Immature Granulocytes: 5 %
Lymphocytes Relative: 3 %
Lymphocytes Relative: 4 %
Lymphs Abs: 0.4 10*3/uL — ABNORMAL LOW (ref 0.7–4.0)
Lymphs Abs: 0.4 10*3/uL — ABNORMAL LOW (ref 0.7–4.0)
MCH: 30.7 pg (ref 26.0–34.0)
MCH: 30.6 pg (ref 26.0–34.0)
MCHC: 32.9 g/dL (ref 30.0–36.0)
MCHC: 33.6 g/dL (ref 30.0–36.0)
MCV: 93.5 fL (ref 80.0–100.0)
MCV: 91 fL (ref 80.0–100.0)
Monocytes Absolute: 0.5 10*3/uL (ref 0.1–1.0)
Monocytes Absolute: 0.6 10*3/uL (ref 0.1–1.0)
Monocytes Relative: 5 %
Monocytes Relative: 6 %
Neutro Abs: 9.1 10*3/uL — ABNORMAL HIGH (ref 1.7–7.7)
Neutro Abs: 8.3 10*3/uL — ABNORMAL HIGH (ref 1.7–7.7)
Neutrophils Relative %: 88 %
Neutrophils Relative %: 85 %
Platelets: 403 10*3/uL — ABNORMAL HIGH (ref 150–400)
Platelets: 403 10*3/uL — ABNORMAL HIGH (ref 150–400)
RBC: 3.71 MIL/uL — ABNORMAL LOW (ref 4.22–5.81)
RBC: 3.76 MIL/uL — ABNORMAL LOW (ref 4.22–5.81)
RDW: 14.5 % (ref 11.5–15.5)
RDW: 14.3 % (ref 11.5–15.5)
WBC: 10.4 10*3/uL (ref 4.0–10.5)
WBC: 9.8 10*3/uL (ref 4.0–10.5)
nRBC: 0 % (ref 0.0–0.2)
nRBC: 0 % (ref 0.0–0.2)

## 2023-04-16 LAB — COMPREHENSIVE METABOLIC PANEL
ALT: 36 U/L (ref 0–44)
ALT: 36 U/L (ref 0–44)
AST: 36 U/L (ref 15–41)
AST: 35 U/L (ref 15–41)
Albumin: 2.8 g/dL — ABNORMAL LOW (ref 3.5–5.0)
Albumin: 2.8 g/dL — ABNORMAL LOW (ref 3.5–5.0)
Alkaline Phosphatase: 65 U/L (ref 38–126)
Alkaline Phosphatase: 65 U/L (ref 38–126)
Anion gap: 10 (ref 5–15)
Anion gap: 10 (ref 5–15)
BUN: 38 mg/dL — ABNORMAL HIGH (ref 8–23)
BUN: 38 mg/dL — ABNORMAL HIGH (ref 8–23)
CO2: 25 mmol/L (ref 22–32)
CO2: 25 mmol/L (ref 22–32)
Calcium: 9.4 mg/dL (ref 8.9–10.3)
Calcium: 9.6 mg/dL (ref 8.9–10.3)
Chloride: 102 mmol/L (ref 98–111)
Chloride: 103 mmol/L (ref 98–111)
Creatinine, Ser: 1 mg/dL (ref 0.61–1.24)
Creatinine, Ser: 0.92 mg/dL (ref 0.61–1.24)
GFR, Estimated: >60 mL/min (ref >60)
GFR, Estimated: >60 mL/min (ref >60)
Glucose, Bld: 156 mg/dL — ABNORMAL HIGH (ref 70–99)
Glucose, Bld: 126 mg/dL — ABNORMAL HIGH (ref 70–99)
Potassium: 3.7 mmol/L (ref 3.5–5.1)
Potassium: 3.6 mmol/L (ref 3.5–5.1)
Sodium: 137 mmol/L (ref 135–145)
Sodium: 138 mmol/L (ref 135–145)
Total Bilirubin: 0.6 mg/dL (ref 0.0–1.2)
Total Bilirubin: 0.5 mg/dL (ref 0.0–1.2)
Total Protein: 6.7 g/dL (ref 6.5–8.1)
Total Protein: 6.8 g/dL (ref 6.5–8.1)

## 2023-04-16 LAB — VANCOMYCIN, RANDOM: Vancomycin Rm: 11 ug/mL

## 2023-04-16 LAB — PHOSPHORUS
Phosphorus: 3.2 mg/dL (ref 2.5–4.6)
Phosphorus: 3.3 mg/dL (ref 2.5–4.6)

## 2023-04-16 LAB — PROTIME-INR
INR: 1.9 — ABNORMAL HIGH (ref 0.8–1.2)
Prothrombin Time: 22.3 s — ABNORMAL HIGH (ref 11.4–15.2)

## 2023-04-16 LAB — MAGNESIUM
Magnesium: 1.7 mg/dL (ref 1.7–2.4)
Magnesium: 1.8 mg/dL (ref 1.7–2.4)

## 2023-04-16 SURGERY — ECHOCARDIOGRAM, TRANSESOPHAGEAL
Anesthesia: General

## 2023-04-16 MED ORDER — FUROSEMIDE 20 MG PO TABS
20.0000 mg | ORAL_TABLET | Freq: Every day | ORAL | Status: DC
  Administered 2023-04-17 – 2023-04-27 (×11): 20 mg via ORAL
  Filled 2023-04-16 (×11): qty 1

## 2023-04-16 MED ORDER — MIDAZOLAM HCL 2 MG/2ML IJ SOLN
INTRAMUSCULAR | Status: DC | PRN
  Administered 2023-04-16: 2 mg via INTRAVENOUS

## 2023-04-16 MED ORDER — PROPOFOL 1000 MG/100ML IV EMUL
INTRAVENOUS | Status: AC
  Filled 2023-04-16: qty 100

## 2023-04-16 MED ORDER — PROPOFOL 10 MG/ML IV BOLUS
INTRAVENOUS | Status: DC | PRN
  Administered 2023-04-16: 10 mg via INTRAVENOUS
  Administered 2023-04-16: 50 mg via INTRAVENOUS
  Administered 2023-04-16 (×2): 20 mg via INTRAVENOUS

## 2023-04-16 MED ORDER — BUPROPION HCL ER (XL) 150 MG PO TB24
150.0000 mg | ORAL_TABLET | Freq: Every day | ORAL | Status: DC
  Administered 2023-04-17 – 2023-04-18 (×2): 150 mg via ORAL
  Filled 2023-04-16 (×2): qty 1

## 2023-04-16 MED ORDER — SODIUM CHLORIDE 0.9 % IV SOLN: INTRAVENOUS | Status: DC

## 2023-04-16 MED ORDER — MIDAZOLAM HCL 2 MG/2ML IJ SOLN
INTRAMUSCULAR | Status: AC
  Filled 2023-04-16: qty 2

## 2023-04-16 MED ORDER — WARFARIN SODIUM 10 MG PO TABS
10.0000 mg | ORAL_TABLET | Freq: Once | ORAL | Status: AC
  Administered 2023-04-16: 10 mg via ORAL
  Filled 2023-04-16: qty 1

## 2023-04-16 MED ORDER — BUTAMBEN-TETRACAINE-BENZOCAINE 2-2-14 % EX AERO
INHALATION_SPRAY | CUTANEOUS | Status: AC
Start: 2023-04-16 — End: ?
  Filled 2023-04-16: qty 5

## 2023-04-16 MED ORDER — LIDOCAINE VISCOUS HCL 2 % MT SOLN
OROMUCOSAL | Status: AC
  Filled 2023-04-16: qty 15

## 2023-04-16 MED ORDER — SODIUM CHLORIDE FLUSH 0.9 % IV SOLN
INTRAVENOUS | Status: AC
  Filled 2023-04-16: qty 10

## 2023-04-16 NOTE — Consult Note (Signed)
PHARMACY - ANTICOAGULATION CONSULT NOTE  Pharmacy Consult for Warfarin Indication: hx recurrent VTE  Patient Measurements: Height: 4\' 10"  (147.3 cm) Weight: (!) 136.2 kg (300 lb 4.8 oz) IBW/kg (Calculated) : 45.4  Labs: Recent Labs    04/09/23 2025 04/09/23 2132 04/10/23 0127 04/10/23 1051  HGB 10.9*  --   --  10.8*  HCT 31.4*  --   --  31.4*  PLT 299  --   --  229  APTT  --   --  55*  --   LABPROT  --   --  30.6*  --   INR  --   --  2.9*  --   CREATININE 3.89*  --   --  3.11*  TROPONINIHS  --  22*  --   --    Estimated Creatinine Clearance: 26.6 mL/min (A) (by C-G formula based on SCr of 3.11 mg/dL (H)).  Medical History: Past Medical History:  Diagnosis Date   Depression    History of blood clots 08/13/2011   History of left knee replacement    HTN (hypertension)    Lymphedema    lt leg-per pt   PE (pulmonary thromboembolism) (HCC)    Pressure injury of skin, unspecified injury stage, unspecified location    S/P AKA (above knee amputation) bilateral (HCC)    rt 11/24/07 and lt 01/11/17   PTA warfarin dosing: Warfarin dosing difficult to determine. Went to speak with patient who could not give a firm regimen. Base dose is 5 mg but phones in INR weekly with clinic. He estimates an average of warfarin 6 mg daily. Last dose was 2/4 morning.  Assessment: 68 y.o. 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 Patient was hospitalized last month for recurrent VTE. Patient was on warfarin at the time of that VTE, but at presentation INR was subtherapeutic at 1.2 due to lack of enoxaparin bridge, and so warfarin was seen as not a therapeutic failure. Pt has a large body habitus, and will require higher doses of warfarin. Unsure what his dose was outpatient if the clinic made any adjustments, so not sure what caused the  supratherapeutic INR. Last admission we had to give 10-15 mg doses to get INR therapeutic. No major DDI. Albumin is low at 2.8, which would allow more free warfarin.    New drug-drug interactions: N/A  Goal of Therapy:  INR 2-3 Monitor platelets by anticoagulation protocol: Yes  Monitoring:  Date INR Warfarin Dose   2/5 2.9 6 mg  2/6 4 Hold  2/7 4.5 Hold, Vit K 1mg  IV for upcoming port removal   2/8 2.8 HOLD for procedure  2/9 2.1 HOLD for procedure  2/10 1.9 10 mg  2/11 1.9 10 mg   Plan:  INR is subtherapeutic due to vitamin K and held warfarin doses 2/8 and 2/9. Hopefully INR trends up 2/12. Will give warfarin 10 mg x 1 tonight. I predict at discharge pt may need 10-12 mg daily based on last admission data. When pt received 7.5 mg x 2 doses last admission the INR dropped to 1.5 after receiving multiple 15 mg doses. As INR is subtherapeutic, will continue enoxaparin bridge. May stop enoxaparin after INR is therapeutic x 24 hours. Goal should be to maintain INR > 2.   Paschal Dopp, PharmD Clinical Pharmacist 04/16/2023

## 2023-04-16 NOTE — Plan of Care (Signed)
Problem: Fluid Volume: Goal: Hemodynamic stability will improve Outcome: Progressing   Problem: Clinical Measurements: Goal: Diagnostic test results will improve Outcome: Progressing Goal: Signs and symptoms of infection will decrease Outcome: Progressing   Problem: Respiratory: Goal: Ability to maintain adequate ventilation will improve Outcome: Progressing   Problem: Health Behavior/Discharge Planning: Goal: Ability to manage health-related needs will improve Outcome: Progressing

## 2023-04-16 NOTE — Progress Notes (Signed)
   Transesophageal Echo Consent Note  [facility] has been requested to perform a transesophageal echocardiogram on [name] for MRSA bacteremia, rule out endocarditis.  After careful review of history and examination, the risks and benefits of transesophageal echocardiogram have been explained including risks of esophageal damage, perforation (1:10,000 risk), bleeding, pharyngeal hematoma as well as other potential complications associated with conscious sedation including aspiration, arrhythmia, respiratory failure and death. Alternatives to treatment were discussed, questions were answered. Patient is willing to proceed. TEE scheduled for today at 12 PM.  Signed: Gale Journey, PA-C  04/16/2023 8:58 AM

## 2023-04-16 NOTE — Progress Notes (Signed)
*  PRELIMINARY RESULTS* Echocardiogram Echocardiogram Transesophageal has been performed.  [name] 04/16/2023, 1:18 PM

## 2023-04-16 NOTE — Anesthesia Preprocedure Evaluation (Signed)
Anesthesia Evaluation  Patient identified by MRN, date of birth, ID band Patient awake  General Assessment Comment:Ileus, no n/v   Reviewed: Allergy & Precautions, NPO status , Patient's Chart, lab work & pertinent test results  History of Anesthesia Complications Negative for: history of anesthetic complications  Airway Mallampati: IV  TM Distance: >3 FB Neck ROM: full    Dental no notable dental hx.    Pulmonary shortness of breath (stable) and at rest, sleep apnea , pneumonia (on 2L), unresolved, PE   Pulmonary exam normal        Cardiovascular hypertension, On Medications +CHF and + DVT  Normal cardiovascular exam  ECHO 1/18 IMPRESSIONS     1. Left ventricular ejection fraction, by estimation, is 55 to 60%. The  left ventricle has normal function. Left ventricular endocardial border  not optimally defined to evaluate regional wall motion. Left ventricular  diastolic parameters are  indeterminate.   2. Right ventricular systolic function was not well visualized. The right  ventricular size is normal.   3. The mitral valve is normal in structure. No evidence of mitral valve  regurgitation. No evidence of mitral stenosis.   4. The aortic valve is normal in structure. Aortic valve regurgitation is  not visualized. No aortic stenosis is present.   5. The inferior vena cava is normal in size with greater than 50%  respiratory variability, suggesting right atrial pressure of 3 mmHg.   6. Ascending aorta measurements are within normal limits for age when  indexed to body surface area.     Neuro/Psych  PSYCHIATRIC DISORDERS  Depression     Neuromuscular disease    GI/Hepatic negative GI ROS, Neg liver ROS,,,  Endo/Other    Class 4 obesity  Renal/GU Renal disease  negative genitourinary   Musculoskeletal   Abdominal   Peds  Hematology  (+) Blood dyscrasia, anemia   Anesthesia Other Findings Past Medical  History: No date: Depression 08/13/2011: History of blood clots No date: History of left knee replacement No date: HTN (hypertension) No date: Lymphedema     Comment:  lt leg-per pt No date: PE (pulmonary thromboembolism) (HCC) No date: Pressure injury of skin, unspecified injury stage,  unspecified location No date: S/P AKA (above knee amputation) bilateral (HCC)     Comment:  rt 11/24/07 and lt 01/11/17  Past Surgical History: No date: ACHILLES TENDON SURGERY; Left     Comment:  per pt 10/04/2015: bone clip removal; Left     Comment:  knee 11/2012: CARDIAC SURGERY     Comment:  cath.-per pt 07/28/2015: CERVICAL FUSION     Comment:  per pt No date: EPIGASTRIC HERNIA REPAIR     Comment:  per pt 11/02/2015: hematoma removal; Left     Comment:  knee No date: HIP SURGERY; Right     Comment:  x8 per pt 11/14/2022: IR IMAGING GUIDED PORT INSERTION No date: KNEE FUSION; Right 09/11/2011: KNEE JOINT MANIPULATION; Left No date: KNEE SURGERY; Left     Comment:  x3 03/2017: leg stump; Left     Comment:  leg surgery-per pt 02/2015: NECK SURGERY     Comment:  plates-per pt No date: REPLACEMENT TOTAL KNEE; Right 07/02/2011: REPLACEMENT TOTAL KNEE; Left     Comment:  again in 07/10/2011 per pt No date: s/p of bilateral AKA; Right 10/20/2008: screen filter removal     Comment:  for embolism per pt No date: screen filter replacement     Comment:  x3 No date: SHOULDER SURGERY;  Right     Comment:  x2-per pt 02/11/2015: SPINE SURGERY     Comment:  vertebra spacer-per pt No date: toe nail removal; Right     Comment:  per pt No date: VARICOSE VEIN SURGERY     Comment:  x5-per pt  BMI    Body Mass Index: 65.11 kg/m      Reproductive/Obstetrics negative OB ROS                             Anesthesia Physical Anesthesia Plan  ASA: 4  Anesthesia Plan: General   Post-op Pain Management: Minimal or no pain anticipated   Induction: Intravenous  PONV Risk  Score and Plan: 1 and Propofol infusion and TIVA  Airway Management Planned: Natural Airway and Nasal Cannula  Additional Equipment:   Intra-op Plan:   Post-operative Plan:   Informed Consent: I have reviewed the patients History and Physical, chart, labs and discussed the procedure including the risks, benefits and alternatives for the proposed anesthesia with the patient or authorized representative who has indicated his/her understanding and acceptance.     Dental Advisory Given  Plan Discussed with: Anesthesiologist, CRNA and Surgeon  Anesthesia Plan Comments: (Patient consented for risks of anesthesia including but not limited to:  - adverse reactions to medications - risk of airway placement if required - damage to eyes, teeth, lips or other oral mucosa - nerve damage due to positioning  - sore throat or hoarseness - Damage to heart, brain, nerves, lungs, other parts of body or loss of life  Patient voiced understanding and assent.)        Anesthesia Quick Evaluation

## 2023-04-16 NOTE — CV Procedure (Signed)
TEE: Under moderate sedation, TEE was performed without complications: LV: Normal. Normal EF.   MV: No vegetation TV: No vegetation AV: No vegetation PV: No vegetation  No evidence of endocarditis.    Cecilie Heidel, DO 1:48 PM 04/16/23

## 2023-04-16 NOTE — TOC Progression Note (Signed)
Transition of Care [facility]) - Progression Note    Patient Details  Name: [name] MRN: [id number] Date of Birth: [birth year]  [facility]) CM/SW Contact  Truddie Hidden, RN Phone Number: 04/16/2023, 2:07 PM  Clinical Narrative:    FL2 completed.         Expected Discharge Plan and Services                                               Social Determinants of Health (SDOH) Interventions SDOH Screenings   Food Insecurity: No Food Insecurity (04/11/2023)  Housing: Low Risk  (04/11/2023)  Transportation Needs: No Transportation Needs (04/11/2023)  Utilities: Not At Risk (04/11/2023)  Financial Resource Strain: High Risk (07/28/2021)   Received from [facility], [facility]  Physical Activity: Sufficiently Active (07/28/2021)   Received from [facility], [facility]  Social Connections: Moderately Integrated (04/11/2023)  Stress: Stress Concern Present (08/01/2021)   Received from [facility], [facility]  Tobacco Use: Medium Risk (04/11/2023)    Readmission Risk Interventions    03/25/2023   10:53 AM  Readmission Risk Prevention Plan  Transportation Screening Complete  PCP or Specialist Appt within 5-7 Days Complete  Home Care Screening Complete  Medication Review (RN CM) Complete

## 2023-04-16 NOTE — Progress Notes (Signed)
Date of Admission:  04/09/2023   T ID: [name] is a 68 y.o. male  Principal Problem:   HAP (hospital-acquired pneumonia) Active Problems:   Prostate cancer metastatic to bone [facility])   PE (pulmonary thromboembolism) (HCC)   Essential hypertension   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   Ogilvie syndrome   Severe sepsis (HCC)   AKI (acute kidney injury) (HCC)   MRSA bacteremia   Pulmonary infiltrates   Septic embolism (HCC)   Palliative care encounter   Acute respiratory failure with hypoxia (HCC)   Morbid obesity (HCC)    Subjective: Pt is feeling better No SOB Had TEE today  Medications:   acetaminophen  650 mg Oral Q6H   buPROPion  300 mg Oral Daily   Chlorhexidine Gluconate Cloth  6 each Topical Daily   dicyclomine  10 mg Oral TID AC & HS   enoxaparin (LOVENOX) injection  1 mg/kg Subcutaneous Q12H   [START ON 04/17/2023] furosemide  20 mg Oral Daily   gabapentin  200 mg Oral TID   lactulose  10 g Oral TID   leptospermum manuka honey  1 Application Topical Daily   lidocaine  1 patch Transdermal Q24H   nystatin cream   Topical BID   polyethylene glycol  17 g Oral BID   predniSONE  30 mg Oral Q breakfast   Warfarin - Pharmacist Dosing Inpatient   Does not apply q1600    Objective: Vital signs in last 24 hours: Patient Vitals for the past 24 hrs:  BP Temp Temp src Pulse Resp SpO2  04/16/23 2337 115/76 97.9 F (36.6 C) Oral 63 20 98 %  04/16/23 1955 132/72 97.9 F (36.6 C) Oral 68 20 96 %  04/16/23 1632 (!) 150/85 97.7 F (36.5 C) Oral 63 19 96 %  04/16/23 1315 120/69 -- -- (!) 57 14 96 %  04/16/23 1300 122/66 -- -- (!) 55 16 95 %  04/16/23 1245 121/68 -- -- 61 14 100 %  04/16/23 1240 109/63 -- -- 61 18 99 %  04/16/23 1236 -- -- -- 64 19 99 %  04/16/23 1235 114/70 -- -- -- 16 --  04/16/23 1234 -- -- -- 64 18 99 %  04/16/23 1233 110/68 -- -- 66 17 99 %  04/16/23 1232 -- -- -- 64 17 98 %  04/16/23 1231 -- -- -- -- 15 --  04/16/23  1230 113/66 -- -- 66 15 95 %  04/16/23 1229 -- -- -- 67 19 98 %  04/16/23 1228 132/82 -- -- 67 17 100 %  04/16/23 1227 -- -- -- 66 18 100 %  04/16/23 1226 -- -- -- 63 19 97 %  04/16/23 1225 137/78 -- -- 65 20 100 %  04/16/23 1224 -- -- -- 66 17 99 %  04/16/23 1223 (!) 132/95 -- -- 64 15 99 %  04/16/23 1222 -- -- -- 63 18 99 %  04/16/23 1156 (!) 147/80 98.7 F (37.1 C) Oral 62 19 95 %  04/16/23 0820 132/77 97.9 F (36.6 C) Oral 62 20 100 %  04/16/23 0553 124/77 97.9 F (36.6 C) Oral 64 18 99 %  04/15/23 2351 (!) 149/89 98.1 F (36.7 C) Oral 63 18 98 %      PHYSICAL EXAM:  General:awake and alert, no resp distress Previous Port site covered with dressing Lungs: Bilateral air entry Heart: S1-S2. Abdomen: Soft, non-tender,not distended. Bowel sounds normal. No masses Extremities: Bilateral AKA  skin:  No rashes or lesions. Or bruising Lymph: Cervical, supraclavicular normal. Neurologic: Grossly non-focal  Lab Results    Latest Ref Rng & Units 04/16/2023    6:03 AM 04/16/2023    2:11 AM 04/15/2023    5:48 AM  CBC  WBC 4.0 - 10.5 K/uL 9.8  10.4  10.6   Hemoglobin 13.0 - 17.0 g/dL 04.5  40.9  81.1   Hematocrit 39.0 - 52.0 % 34.2  34.7  30.9   Platelets 150 - 400 K/uL 403  403  376        Latest Ref Rng & Units 04/16/2023    6:03 AM 04/16/2023    2:11 AM 04/15/2023    3:28 PM  CMP  Glucose 70 - 99 mg/dL 914  782    BUN 8 - 23 mg/dL 38  38    Creatinine 9.56 - 1.24 mg/dL 2.13  0.86    Sodium 578 - 145 mmol/L 138  137    Potassium 3.5 - 5.1 mmol/L 3.6  3.7  2.8   Chloride 98 - 111 mmol/L 103  102    CO2 22 - 32 mmol/L 25  25    Calcium 8.9 - 10.3 mg/dL 9.6  9.4    Total Protein 6.5 - 8.1 g/dL 6.8  6.7    Total Bilirubin 0.0 - 1.2 mg/dL 0.5  0.6    Alkaline Phos 38 - 126 U/L 65  65    AST 15 - 41 U/L 35  36    ALT 0 - 44 U/L 36  36        Microbiology: [facility] MRSA on 04/09/23 both sets 2/7 BC- NG So far 2/9 cath tip sent for culture  Studies/Results: No results  found.    Assessment/Plan:  MRSA bacteremia With port in place and multiple nodules in the lungs concern for septic emboli and endocarditis of tricuspid valve  repeat blood culture from 2/7 NG PORT  removed  04/14/23 and culture is MRSA TEE neg for endocarditis  On vancomycin adjusted to crcl Watch closely because of AKI Daptomycin is currently not an option because of  lung infiltrate Will need 4 weeks of antibiotic, after 2 weeks IV can switch to PO linezolid Reduce wellbutrin to 100mg - pt is okay with that     AKI - resolved   Metastatic prostate ca with skeletal mets Chemo stopped due to severe phantom pain    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

## 2023-04-16 NOTE — Transfer of Care (Signed)
Immediate Anesthesia Transfer of Care Note  Patient: [name]  Procedure(s) Performed: TRANSESOPHAGEAL ECHOCARDIOGRAM (TEE)  Patient Location: PACU and special recoveries  Anesthesia Type:General  Level of Consciousness: awake, drowsy, and patient cooperative  Airway & Oxygen Therapy: Patient Spontanous Breathing and supernova  Post-op Assessment: Report given to RN  Post vital signs: Reviewed and stable  Last Vitals:  Vitals Value Taken Time  BP 114/70 04/16/23 1235  Temp    Pulse 64 04/16/23 1236  Resp 19 04/16/23 1236  SpO2 99 % 04/16/23 1236    Last Pain:  Vitals:   04/16/23 1156  TempSrc: Oral  PainSc: 0-No pain      Patients Stated Pain Goal: 3 (04/14/23 0232)  Complications: No notable events documented.

## 2023-04-16 NOTE — Progress Notes (Signed)
Pharmacy Antibiotic Note  [name] is a 68 y.o. male admitted on 04/09/2023 with MRSA bacteremia.  Pharmacy has been consulted for Vancomycin dosing.  Patient with recent admission for recurrent DVT, discharged 1/31.  Comes to ED 2/4 with confusion and shortness of breath.  CT chest/abdomen/pelvis lung findings concerning for septic pulmonary emboli.  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 ID consulted.   Today, 04/16/2023 Day #7 vancomycin Renal: AKI - SCr unstable 1.29 >> 2.35 >> 1.66 > 0.95 >> 0.92 Afebrile/24h WBC 9.8 2/4 blood culture: 3/4 bottles MRSA 2/5 Urine cx: NG Respiratory panel and respiratory virus panel neg 2/5 C difficile neg Rule out endocarditis with concern for septic pulmonary embolism on CT 2/5 ECHO: no endocarditis, TEE pending, PORT removed 2/9 with cath tip growing S aureus 2/7 repeat blood cultures: NG<24 hrs 2/10 repeat blood cultures: NGTD 2/11 TEE Planned  Vancomycin dosing/levels: Vancomycin doses given 2/4 1gm IV x 1 at 2356 then 2/5 1500mg  IV  x1 2/6 random vancomycin level at 0603 = 11 mcg/mL 2/6 vancomycin 1500mg  IV x 1 given at 0947, 2/7 random vancomycin level 0553 = 16 mcg/mL 2/7 Vancomycin 1500 mg given 2/7@1046  2/8 vancomycin 1500 mg given 2/8 @0836  2/9 random vancomycin level @0502  = 23 mcg/ml,  no dose ordered 2/10 random vancomycin 12. Last dose 2/8 @0836  (1500mg  ) 2/11 random vancomycin  = 11 MCG/Ml AT 06:03, previous dose vanco 1500mg  given 2/10 at 11:00  Plan: Continue Vancomycin 1500mg  IV q24h at this time as Scr improving but watch closely as remains on IV lasix. Note history of infusion related reaction with vancomycin - slow infusion Anticipate  some difficulty dosing vancomycin with PMH Bilateral AKA - effect on volume of distribution and SCr (loss of muscle mass).   Follow  renal function closely daily CMP ordered until 2/15 Plan to check vancomycin level(s) once at steady-state Follow current cultures, TEE, repeat blood cultures  Daptomycin- not currently option with septic PE, Pneumonia  Temp (24hrs), Avg:98.1 F (36.7 C), Min:97.7 F (36.5 C), Max:99.1 F (37.3 C)   Recent Labs  Lab 04/09/23 2132 04/10/23 0127 04/10/23 0350 04/10/23 1051 04/13/23 0322 04/14/23 0502 04/15/23 0548 04/16/23 0211 04/16/23 0603  WBC  --   --   --    < > 16.9* 13.2* 10.6* 10.4 9.8  CREATININE  --   --   --    < > 2.35* 1.66* 0.95 1.00 0.92  LATICACIDVEN 1.6 1.5 1.5  --   --   --   --   --   --   VANCORANDOM  --   --   --    < >  --  23 12  --  11   < > = values in this interval not displayed.    Estimated Creatinine Clearance: 89.9 mL/min (by C-G formula based on SCr of 0.92 mg/dL).    Allergies  Allergen Reactions   Bacitracin Hives   Vancomycin Other (See Comments) and Rash    Red man syndrome.   Ketorolac     Other Reaction(s): Kidney Disorder  Other reaction(s): Kidney Disorder   Spironolactone     Other Reaction(s): Unknown  Other reaction(s): Unknown    Antimicrobials this admission: 2/4 Cefepime >> x 1 dose 2/4 Vancomycin >>    Thank you for allowing pharmacy to be a part of this  patient's care.  Juliette Alcide, PharmD, BCPS, BCIDP Work Cell: [phone] 04/16/2023 8:32 AM

## 2023-04-16 NOTE — Anesthesia Procedure Notes (Signed)
Procedure Name: MAC Date/Time: 04/09/2023 12:25 PM  Performed by: Elmarie Mainland, CRNAPre-anesthesia Checklist: Patient identified, Emergency Drugs available, Patient being monitored and Suction available Patient Re-evaluated:Patient Re-evaluated prior to induction Oxygen Delivery Method: Supernova nasal CPAP

## 2023-04-16 NOTE — TOC Progression Note (Signed)
Transition of Care [facility]) - Progression Note    Patient Details  Name: [name] MRN: [id number] Date of Birth: 19-Jan-1956  Transition of Care [facility]) CM/SW Contact  Chapman Fitch, RN Phone Number: 04/16/2023, 4:31 PM  Clinical Narrative:     Notified that patient has questions to for TOC.  TOC unable to meet with patient at bedside at this time.  Attempted to call room phone 3 times.  Rang busy.  Requested that bedside RN check to make sure patient's phone isnt off the hook .Attempted to call patient's cell phone listed on file.   No answer        Expected Discharge Plan and Services                                               Social Determinants of Health (SDOH) Interventions SDOH Screenings   Food Insecurity: No Food Insecurity (04/11/2023)  Housing: Low Risk  (04/11/2023)  Transportation Needs: No Transportation Needs (04/11/2023)  Utilities: Not At Risk (04/11/2023)  Financial Resource Strain: High Risk (07/28/2021)   Received from [facility], [facility]  Physical Activity: Sufficiently Active (07/28/2021)   Received from [facility], [facility]  Social Connections: Moderately Integrated (04/11/2023)  Stress: Stress Concern Present (08/01/2021)   Received from [facility], [facility]  Tobacco Use: Medium Risk (04/11/2023)    Readmission Risk Interventions    03/25/2023   10:53 AM  Readmission Risk Prevention Plan  Transportation Screening Complete  PCP or Specialist Appt within 5-7 Days Complete  Home Care Screening Complete  Medication Review (RN CM) Complete

## 2023-04-16 NOTE — Anesthesia Postprocedure Evaluation (Signed)
Anesthesia Post Note  Patient: [name]  Procedure(s) Performed: TRANSESOPHAGEAL ECHOCARDIOGRAM (TEE)  Patient location during evaluation: Specials Recovery Anesthesia Type: General Level of consciousness: awake and alert Pain management: pain level controlled Vital Signs Assessment: post-procedure vital signs reviewed and stable Respiratory status: spontaneous breathing, nonlabored ventilation, respiratory function stable and patient connected to nasal cannula oxygen Cardiovascular status: blood pressure returned to baseline and stable Postop Assessment: no apparent nausea or vomiting Anesthetic complications: no   No notable events documented.   Last Vitals:  Vitals:   04/16/23 1300 04/16/23 1315  BP: 122/66 120/69  Pulse: (!) 55 (!) 57  Resp: 16 14  Temp:    SpO2: 95% 96%    Last Pain:  Vitals:   04/16/23 1315  TempSrc:   PainSc: 0-No pain                 [name]

## 2023-04-16 NOTE — Progress Notes (Signed)
PROGRESS NOTE    [name]  ZOX:[id number] DOB: [birth year] DOA: 04/09/2023 PCP: Dione Housekeeper, MD  Chief Complaint  Patient presents with   Shortness of Breath    Hospital Course:  [name] is 68 y.o. male with stage IV prostate cancer, recurrent VTE, hypertension, PVD status post AKA, decubitus ulcers, who was admitted on 1/18 with shortness of breath and was found to have recurrent DVT despite Eliquis and then Coumadin (not technically a failure as he was not bridged.)  He had been unable to afford Lovenox as an outpatient.  During hospitalization he was noted to have volume overload and was diuresed.  Stay was also complicated by ileus for stool impaction that was treated aggressively with a bowel regimen.  He was discharged in stable condition on 1/31.  He returned to the ED on 2/4 due to increasing confusion and shortness of breath.  He was found to be saturating 89% on room air.  EMS placed patient on CPAP prior to arrival.  In the ED he was found to be septic with fever of 101.9, respiratory rate 31, leukocytosis 20K, and hypoxia.  Chest x-ray revealed left basilar opacity.  He was placed on BiPAP due to work of breathing and hypoxemia.  He failed weaning trial due to work of breathing though he did have good oxygen saturations.  He was admitted for management of healthcare associated pneumonia and sepsis. Hospital stay has been complex.  CT revealed multiple septic emboli in the lungs.  We attempted port removal early on as this was suspected nidus of infection, however his INR was supratherapeutic which prevented port removal.  He did receive low-dose vitamin K on 2/7 and ultimately was able to have port removed on 2/9.  The tip culture is expectedly positive.  He underwent TEE on 2/11.  He had further respiratory compromise throughout admission and was on prolonged BiPAP.  Further respiratory compromise appeared to be secondary to acute CHF exacerbation.  He has  been started on IV diuresis and has had profound 15 L output since arrival.  He is now stabilizing on 2 L nasal cannula, and will need skilled nursing placement.  Subjective: Seen this morning after his TEE.  TEE was unremarkable, no vegetation or evidence of endocarditis.  Patient is currently on room air and doing much better.  He is feeling very motivated to work with physical therapy.  He knows he has some strength training to do before he can return home and live independently.   Objective: Vitals:   04/15/23 1934 04/15/23 2351 04/16/23 0553 04/16/23 0820  BP: 129/74 (!) 149/89 124/77 132/77  Pulse: 68 63 64 62  Resp: 18 18 18 20   Temp: 97.7 F (36.5 C) 98.1 F (36.7 C) 97.9 F (36.6 C) 97.9 F (36.6 C)  TempSrc: Oral Oral Oral Oral  SpO2: 97% 98% 99% 100%  Weight:      Height:        Intake/Output Summary (Last 24 hours) at 04/16/2023 0920 Last data filed at 04/16/2023 0700 Gross per 24 hour  Intake 300 ml  Output 2651 ml  Net -2351 ml   Filed Weights   04/09/23 2019  Weight: 136 kg    Examination: General exam: NAD, calm Respiratory system: Shortened expiratory phase, 2L [place], CTAB Cardiovascular system: S1 & S2 heard, RRR.  Gastrointestinal system: Abdomen is soft, nontender. Neuro: Awake, alert and oriented x 3 Extremities: Bilateral AKA  Skin: No rashes, lesions Psychiatry: Calm, mood and  affect appropriate for situation  Assessment & Plan:  Principal Problem:   HAP (hospital-acquired pneumonia) Active Problems:   Severe sepsis (HCC)   PE (pulmonary thromboembolism) (HCC)   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   Ogilvie syndrome   AKI (acute kidney injury) (HCC)   Prostate cancer metastatic to bone [facility])   Essential hypertension   MRSA bacteremia   Pulmonary infiltrates   Septic embolism (HCC)   Palliative care encounter   Acute respiratory failure with hypoxia (HCC)   Morbid obesity (HCC)    Severe sepsis - Criteria met with  hypoxemia, fever, AKI, leukocytosis, tachypnea.  Source: Pneumonia and bacteremia - Antibiotics as below  MRSA bacteremia - Patient is on vancomycin - ID consulted - On arrival port in place and there are multiple nodules in the lungs concerning for septic emboli - TEE without evidence of endocarditis  - Port removed 2/9.  Tip culture positive for staph  Healthcare associated pneumonia +/- septic emboli Hypoxic respiratory failure - Respiratory status much improved now.  Has required BiPAP multiple times throughout this admission.  Has now been successfully weaned to room air Respiratory failure secondary to pneumonia, septic emboli, and then pulmonary edema -- Started on BID Lasix 2/8 and has significant improvement. Tapered to home dose now. - Has been on prednisone, begin tapering 2/10. - Procalcitonin: 5.8 - Leukocytosis complicated by steroid use.  AKI - Baseline creatinine 0.98, creatinine 3.89 on arrival - Lasix was added and creatinine has improved significantly.  Is now back to baseline -- ID to consider linezolid if needed, but currently patient appears to be tolerating vancomycin. - Status post IVF. -- Strict Ins and Outs.   Pulmonary embolism Supratherapeutic INR - Has had recurrent VTEs on Eliquis, now on warfarin therapy --  Received low-dose vitamin K  2/7 for INR 4.5, needed port removal. Avoid high-dose vitamin K as he is very high risk for coagulopathy given his sepsis, malignancy, and history of recurrent VTE -- INR now <2, pharm consult to bridge with lovenox -- Pharmacy to manage Warfarin dosing  HF preserved EF, with acute exacerbation - On arrival BNP 110, and patient did not appear acutely volume overloaded.  He was septic and received some IV fluids.  Suspect he became overloaded during that time. - He has now been started on IV diuresis and has put out Over 15L since admission. -- Diuresis slowly now, will back off on lasix today. Resume home dose 20mg  -  ECHO: EF 55-60%, no RWMA, mildly dilated LV, diastolic parameters indeterminate  Hypokalemia - Secondary to IV Lasix - Continue to replace PRN  Prostate cancer metastatic to bone - Follows with oncology outpatient, currently holding treatment in setting of sepsis - Port removal as above - Palliative consult - Patient was on hospice outpatient but he misunderstood the services.  He is not interested in hospice at this time  Hypertension - Continue home meds  Back and flank pain - Suspect this is secondary to his multiple osseous lesions.  Continue with as needed Dilaudid and Percocet.  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 PT/Ot consulted, improving now.  DVT prophylaxis: Warfarin with lovenox bridge   Code Status: Limited: Do not attempt resuscitation (DNR) -DNR-LIMITED -Do Not Intubate/DNI  Family Communication: discussed directly with patient Disposition:  Status is: Inpatient, workup ongoing.  Currently necessitates IV antibiotics.  Patient is nearing his baseline now.  Will need to discharge directly to rehab, and then from there home with home health. TOC consulted for rehab planning    Consultants:  ID Cardiology (TEE) IR (port removal)  Procedures:    Antimicrobials:  Anti-infectives (From admission, onward)     Start     Dose/Rate Route Frequency Ordered Stop   04/15/23 0800  vancomycin (VANCOREADY) IVPB 1500 mg/300 mL        1,500 mg 100 mL/hr over 180 Minutes Intravenous Every 24 hours 04/15/23 0649     04/13/23 1030  vancomycin variable dose per unstable renal function (pharmacist dosing)  Status:  Discontinued         Does not apply See admin instructions 04/13/23 1031 04/15/23 0711   04/12/23 1000  vancomycin (VANCOREADY) IVPB 1500 mg/300 mL  Status:  Discontinued        1,500 mg 100 mL/hr over 180 Minutes Intravenous Every 24 hours 04/12/23 0831 04/13/23 1031   04/11/23 1000  vancomycin (VANCOREADY) IVPB 1500 mg/300 mL        1,500 mg 75 mL/hr over 240 Minutes Intravenous  Once 04/11/23 0828 04/11/23 1358   04/10/23 0308  vancomycin variable dose per unstable renal function (pharmacist dosing)  Status:  Discontinued         Does not apply See admin instructions 04/10/23 0308 04/12/23 0831   04/10/23 0145  vancomycin (VANCOREADY) IVPB 1500 mg/300 mL        1,500 mg 100 mL/hr over 180 Minutes Intravenous  Once 04/10/23 0131 04/10/23 0546   04/10/23 0130  vancomycin (VANCOCIN) IVPB 1000 mg/200 mL premix  Status:  Discontinued        1,000 mg 200 mL/hr over 60 Minutes Intravenous  Once 04/10/23 0120 04/10/23 0131   04/09/23 2100  vancomycin (VANCOCIN) IVPB 1000 mg/200 mL premix        1,000 mg 200 mL/hr over 60 Minutes Intravenous  Once 04/09/23 2047 04/10/23 0113   04/09/23 2100  ceFEPIme (MAXIPIME) 2 g in sodium chloride 0.9 % 100 mL IVPB        2 g 200 mL/hr over 30 Minutes Intravenous  Once 04/09/23 2047 04/10/23 0113       Data Reviewed: I have personally reviewed following labs and imaging studies CBC: Recent Labs  Lab 04/13/23 0322 04/14/23 0502 04/15/23 0548 04/16/23 0211 04/16/23 0603  WBC 16.9* 13.2* 10.6* 10.4 9.8  NEUTROABS 15.0* 11.7* 8.9* 9.1* 8.3*  HGB 10.8* 10.6* 10.5* 11.4* 11.5*  HCT 33.2* 31.8* 30.9* 34.7* 34.2*  MCV 96.0 93.0 90.1 93.5 91.0  PLT 333 298 376  403* 403*   Basic Metabolic Panel: Recent Labs  Lab 04/13/23 0322 04/14/23 0502 04/15/23 0548 04/15/23 1528 04/16/23 0211 04/16/23 0603  NA 134* 134* 138  --  137 138  K 3.2* 3.9 2.7* 2.8* 3.7 3.6  CL 100 104 104  --  102 103  CO2 22 20* 24  --  25 25  GLUCOSE 125* 145* 116*  --  156* 126*  BUN 57* 60* 47*  --  38* 38*  CREATININE 2.35* 1.66* 0.95  --  1.00 0.92  CALCIUM 9.9 9.9 9.9  --  9.4 9.6  MG 1.9 1.9 1.9  --  1.7 1.8  PHOS 3.4 3.4 2.7  --  3.2 3.3   GFR: Estimated Creatinine Clearance: 89.9 mL/min (by C-G formula based on SCr of 0.92 mg/dL). Liver Function Tests: Recent Labs  Lab 04/13/23 0322 04/14/23 0502 04/15/23 0548 04/16/23 0211 04/16/23 0603  AST 34 32 33 36 35  ALT 27 24 31  36 36  ALKPHOS 65 65 60 65 65  BILITOT 1.1 1.3* 0.5 0.6 0.5  PROT 6.7 6.5 6.4* 6.7 6.8  ALBUMIN 3.3* 3.0* 2.8* 2.8* 2.8*   CBG: No results for input(s): "GLUCAP" in the last 168 hours.   Recent Results (from the past 240 hours)  Resp panel by RT-PCR (RSV, Flu A&B, Covid) Anterior Nasal Swab     Status: None   Collection Time: 04/09/23  8:19 PM   Specimen: Anterior Nasal Swab  Result Value Ref Range Status   SARS Coronavirus 2 by RT PCR NEGATIVE NEGATIVE Final    Comment: (NOTE) SARS-CoV-2 target nucleic acids are NOT DETECTED.  The SARS-CoV-2 RNA is generally detectable in upper respiratory specimens during the acute phase of infection. The lowest concentration of SARS-CoV-2 viral copies this assay can detect is 138 copies/mL. A negative result does not preclude SARS-Cov-2 infection and should not be used as the sole basis for treatment or other patient management decisions. A negative result may occur with  improper specimen collection/handling, submission of specimen other than nasopharyngeal swab, presence of viral mutation(s) within the areas targeted by this assay, and inadequate number of viral copies(<138 copies/mL). A negative result must be combined  with clinical observations, patient history, and epidemiological information. The expected result is Negative.  Fact Sheet for Patients:  BloggerCourse.com  Fact Sheet for Healthcare Providers:  SeriousBroker.it  This test is no t yet approved or cleared by the [place] FDA and  has been authorized for detection and/or diagnosis of SARS-CoV-2 by FDA under an Emergency Use Authorization (EUA). This EUA will remain  in effect (meaning this test can be used) for the duration of the COVID-19 declaration under Section 564(b)(1) of the Act, 21 U.S.C.section 360bbb-3(b)(1), unless the authorization is terminated  or revoked sooner.       Influenza A by PCR NEGATIVE NEGATIVE Final   Influenza B by PCR NEGATIVE NEGATIVE Final    Comment: (NOTE) The Xpert Xpress SARS-CoV-2/FLU/RSV plus assay is intended as an aid in the diagnosis of influenza from Nasopharyngeal swab specimens and should not be used as a sole basis for treatment. Nasal washings and aspirates are unacceptable for Xpert Xpress SARS-CoV-2/FLU/RSV testing.  Fact Sheet for Patients: BloggerCourse.com  Fact Sheet for Healthcare Providers: SeriousBroker.it  This test is not yet approved or cleared by the [place] FDA and has been authorized for detection and/or diagnosis of SARS-CoV-2 by FDA under an Emergency Use Authorization (EUA). This EUA will remain in effect (meaning this test can be used) for the duration of the COVID-19 declaration under Section 564(b)(1) of the Act, 21 U.S.C. section 360bbb-3(b)(1), unless the authorization is terminated or revoked.     Resp Syncytial Virus by PCR NEGATIVE NEGATIVE Final    Comment: (NOTE) Fact Sheet for Patients: BloggerCourse.com  Fact Sheet for Healthcare Providers: SeriousBroker.it  This test is not yet approved  or cleared by the [place]  [place] FDA and has been authorized for detection and/or diagnosis of SARS-CoV-2 by FDA under an Emergency Use Authorization (EUA). This EUA will remain in effect (meaning this test can be used) for the duration of the COVID-19 declaration under Section 564(b)(1) of the Act, 21 U.S.C. section 360bbb-3(b)(1), unless the authorization is terminated or revoked.  Performed at [facility], [address], [place], [place] [address]   Blood Culture (routine x 2)     Status: Abnormal (Preliminary result)   Collection Time: 04/09/23  8:19 PM   Specimen: Right Antecubital; Blood  Result Value Ref Range Status   Specimen Description   Final    RIGHT ANTECUBITAL BLOOD Performed at [facility], [address] [place], [place] [address]    Special Requests   Final    BOTTLES DRAWN AEROBIC AND ANAEROBIC Blood Culture results may not be optimal due to an inadequate volume of blood received in culture bottles Performed at [facility], [address] [place], [place] [address]    Culture  Setup Time   Final    GRAM POSITIVE COCCI IN BOTH AEROBIC AND ANAEROBIC BOTTLES CRITICAL RESULT CALLED TO, READ BACK BY AND VERIFIED WITH: TREY GREENWOOD PHARMD 1137 04/10/23 HNM GRAM STAIN REVIEWED-AGREE WITH RESULT DRT    Culture (A)  Final    METHICILLIN RESISTANT STAPHYLOCOCCUS AUREUS Sent to [facility] for further susceptibility testing. Performed at [facility], [address] [place], [place] [address]    Report Status PENDING  Incomplete   Organism ID, Bacteria METHICILLIN RESISTANT STAPHYLOCOCCUS AUREUS  Final      Susceptibility   Methicillin resistant staphylococcus aureus - MIC*    CIPROFLOXACIN >=8 RESISTANT Resistant     ERYTHROMYCIN >=8 RESISTANT Resistant     GENTAMICIN <=0.5 SENSITIVE Sensitive     OXACILLIN >=4 RESISTANT Resistant     TETRACYCLINE <=1 SENSITIVE Sensitive     VANCOMYCIN 1 SENSITIVE Sensitive     TRIMETH/SULFA <=10  SENSITIVE Sensitive     CLINDAMYCIN <=0.25 SENSITIVE Sensitive     RIFAMPIN <=0.5 SENSITIVE Sensitive     Inducible Clindamycin NEGATIVE Sensitive     LINEZOLID 2 SENSITIVE Sensitive     * METHICILLIN RESISTANT STAPHYLOCOCCUS AUREUS  Blood Culture (routine x 2)     Status: Abnormal   Collection Time: 04/09/23  8:19 PM   Specimen: BLOOD RIGHT FOREARM  Result Value Ref Range Status   Specimen Description   Final    BLOOD RIGHT FOREARM Performed at [facility], [address] [place], [place] [address]    Special Requests   Final    BOTTLES DRAWN AEROBIC AND ANAEROBIC Blood Culture results may not be optimal due to an inadequate volume of blood received in culture bottles Performed at [facility], [address] [place], [place] [address]    Culture  Setup Time   Final    GRAM POSITIVE COCCI AEROBIC BOTTLE ONLY CRITICAL VALUE NOTED.  VALUE IS CONSISTENT WITH PREVIOUSLY REPORTED AND CALLED VALUE. GRAM STAIN REVIEWED-AGREE WITH RESULT DRT    Culture (A)  Final    STAPHYLOCOCCUS AUREUS SUSCEPTIBILITIES PERFORMED ON PREVIOUS CULTURE WITHIN THE LAST 5 DAYS. Performed at [facility], [address], [place], [place] [address]    Report Status 04/12/2023 FINAL  Final  Blood Culture ID Panel (Reflexed)     Status: Abnormal   Collection Time: 04/09/23  8:19 PM  Result Value Ref Range Status   Enterococcus faecalis NOT  DETECTED NOT DETECTED Final   Enterococcus Faecium NOT DETECTED NOT DETECTED Final   Listeria monocytogenes NOT DETECTED NOT DETECTED Final   Staphylococcus species DETECTED (A) NOT DETECTED Final    Comment: CRITICAL RESULT CALLED TO, READ BACK BY AND VERIFIED WITH: Gloriann Loan PHARMD 1137 04/10/23 HNM    Staphylococcus aureus (BCID) DETECTED (A) NOT DETECTED Final    Comment: Methicillin (oxacillin)-resistant Staphylococcus aureus (MRSA). MRSA is predictably resistant to beta-lactam antibiotics (except ceftaroline). Preferred therapy is  vancomycin unless clinically contraindicated. Patient requires contact precautions if  hospitalized. CRITICAL RESULT CALLED TO, READ BACK BY AND VERIFIED WITH: Gloriann Loan PHARMD 1137 04/10/23 HNM    Staphylococcus epidermidis NOT DETECTED NOT DETECTED Final   Staphylococcus lugdunensis NOT DETECTED NOT DETECTED Final   Streptococcus species NOT DETECTED NOT DETECTED Final   Streptococcus agalactiae NOT DETECTED NOT DETECTED Final   Streptococcus pneumoniae NOT DETECTED NOT DETECTED Final   Streptococcus pyogenes NOT DETECTED NOT DETECTED Final   A.calcoaceticus-baumannii NOT DETECTED NOT DETECTED Final   Bacteroides fragilis NOT DETECTED NOT DETECTED Final   Enterobacterales NOT DETECTED NOT DETECTED Final   Enterobacter cloacae complex NOT DETECTED NOT DETECTED Final   Escherichia coli NOT DETECTED NOT DETECTED Final   Klebsiella aerogenes NOT DETECTED NOT DETECTED Final   Klebsiella oxytoca NOT DETECTED NOT DETECTED Final   Klebsiella pneumoniae NOT DETECTED NOT DETECTED Final   Proteus species NOT DETECTED NOT DETECTED Final   Salmonella species NOT DETECTED NOT DETECTED Final   Serratia marcescens NOT DETECTED NOT DETECTED Final   Haemophilus influenzae NOT DETECTED NOT DETECTED Final   Neisseria meningitidis NOT DETECTED NOT DETECTED Final   Pseudomonas aeruginosa NOT DETECTED NOT DETECTED Final   Stenotrophomonas maltophilia NOT DETECTED NOT DETECTED Final   Candida albicans NOT DETECTED NOT DETECTED Final   Candida auris NOT DETECTED NOT DETECTED Final   Candida glabrata NOT DETECTED NOT DETECTED Final   Candida krusei NOT DETECTED NOT DETECTED Final   Candida parapsilosis NOT DETECTED NOT DETECTED Final   Candida tropicalis NOT DETECTED NOT DETECTED Final   Cryptococcus neoformans/gattii NOT DETECTED NOT DETECTED Final   Meth resistant mecA/C and MREJ DETECTED (A) NOT DETECTED Final    Comment: CRITICAL RESULT CALLED TO, READ BACK BY AND VERIFIED WITHGloriann Loan  PHARMD 1137 04/10/23 HNM Performed at [facility], [address] [place], [place] [address]   Respiratory (~20 pathogens) panel by PCR     Status: None   Collection Time: 04/10/23  1:27 AM   Specimen: Nasopharyngeal Swab; Respiratory  Result Value Ref Range Status   Adenovirus NOT DETECTED NOT DETECTED Final   Coronavirus 229E NOT DETECTED NOT DETECTED Final    Comment: (NOTE) The Coronavirus on the Respiratory Panel, DOES NOT test for the novel  Coronavirus (2019 nCoV)    Coronavirus HKU1 NOT DETECTED NOT DETECTED Final   Coronavirus NL63 NOT DETECTED NOT DETECTED Final   Coronavirus OC43 NOT DETECTED NOT DETECTED Final   Metapneumovirus NOT DETECTED NOT DETECTED Final   Rhinovirus / Enterovirus NOT DETECTED NOT DETECTED Final   Influenza A NOT DETECTED NOT DETECTED Final   Influenza B NOT DETECTED NOT DETECTED Final   Parainfluenza Virus 1 NOT DETECTED NOT DETECTED Final   Parainfluenza Virus 2 NOT DETECTED NOT DETECTED Final   Parainfluenza Virus 3 NOT DETECTED NOT DETECTED Final   Parainfluenza Virus 4 NOT DETECTED NOT DETECTED Final   Respiratory Syncytial Virus NOT DETECTED NOT DETECTED Final   Bordetella pertussis NOT DETECTED  NOT DETECTED Final   Bordetella Parapertussis NOT DETECTED NOT DETECTED Final   Chlamydophila pneumoniae NOT DETECTED NOT DETECTED Final   Mycoplasma pneumoniae NOT DETECTED NOT DETECTED Final    Comment: Performed at [facility], [address], [place], [place] [address]  Urine Culture     Status: None   Collection Time: 04/10/23  1:27 AM   Specimen: Urine, Random  Result Value Ref Range Status   Specimen Description   Final    URINE, RANDOM Performed at [facility], [address] [place], [place] [address]    Special Requests   Final    NONE Reflexed from [id number] Performed at [facility], [address] [place], [place] [address]    Culture   Final    NO GROWTH Performed at [facility],  [address], [place], [place] [address]    Report Status 04/11/2023 FINAL  Final  C Difficile Quick Screen w PCR reflex     Status: None   Collection Time: 04/10/23  2:00 PM   Specimen: STOOL  Result Value Ref Range Status   C Diff antigen NEGATIVE NEGATIVE Final   C Diff toxin NEGATIVE NEGATIVE Final   C Diff interpretation No C. difficile detected.  Final    Comment: Performed at [facility], [address] [place], [place] [address]  MRSA Next Gen by PCR, Nasal     Status: Abnormal   Collection Time: 04/10/23  7:18 PM   Specimen: Nasal Mucosa; Nasal Swab  Result Value Ref Range Status   MRSA by PCR Next Gen DETECTED (A) NOT DETECTED Final    Comment: RESULT CALLED TO, READ BACK BY AND VERIFIED WITH:  JACOB MOORE AT 2346 04/10/23 JG (NOTE) The GeneXpert MRSA Assay (FDA approved for NASAL specimens only), is one component of a comprehensive MRSA colonization surveillance program. It is not intended to diagnose MRSA infection nor to guide or monitor treatment for MRSA infections. Test performance is not FDA approved in patients less than 37 years old. Performed at [facility], [address] [place], [place] [address]   Culture, blood (Routine X 2) w Reflex to ID Panel     Status: None (Preliminary result)   Collection Time: 04/12/23  5:00 AM   Specimen: BLOOD  Result Value Ref Range Status   Specimen Description BLOOD UNKNOWN  Final   Special Requests   Final    BOTTLES DRAWN AEROBIC AND ANAEROBIC Blood Culture adequate volume   Culture   Final    NO GROWTH 4 DAYS Performed at [facility], [address] [place], [place] [address]    Report Status PENDING  Incomplete  MIC (1 Drug)-blood culture; 04/09/2023; BLOOD RIGHT ARM; MRSA; Daptomycin     Status: Abnormal   Collection Time: 04/12/23  9:40 AM   Specimen: BLOOD RIGHT ARM  Result Value Ref Range Status   Min Inhibitory Conc (1 Drug) Preliminary report (A)  Final    Comment:  (NOTE) Performed At: [facility] [address] [place], [place] [id number] Jolene Schimke MD IH:[id number]    Source [phone]  Final    Comment: Performed at [facility], [address] [place], [place] [address]  Culture, blood (Routine X 2) w Reflex to ID Panel     Status: None (Preliminary result)   Collection Time: 04/12/23  1:35 PM   Specimen: BLOOD  Result Value Ref Range Status   Specimen Description BLOOD BLOOD  LEFT FOREARM  Final   Special Requests   Final    BOTTLES DRAWN AEROBIC AND ANAEROBIC Blood Culture adequate volume   Culture   Final    NO GROWTH 4 DAYS Performed at [facility], [address], [place], [place] [address]    Report Status PENDING  Incomplete  Cath Tip Culture     Status: Abnormal (Preliminary result)   Collection Time: 04/14/23 12:00 PM   Specimen: Catheter Tip; Other  Result Value Ref Range Status   Specimen Description   Final    CATH TIP Performed at [facility], [address] [place], [place] [address]    Special Requests   Final    NONE Performed at [facility], [address] [place], [place] [address]    Culture (A)  Final    >=100,000 COLONIES/mL STAPHYLOCOCCUS AUREUS CULTURE REINCUBATED FOR BETTER GROWTH Performed at [facility], [address], [place], [place] [address]    Report Status PENDING  Incomplete  Culture, blood (Routine X 2) w Reflex to ID Panel     Status: None (Preliminary result)   Collection Time: 04/15/23 10:05 PM   Specimen: BLOOD  Result Value Ref Range Status   Specimen Description BLOOD BLOOD RIGHT ARM  Final   Special Requests   Final    BOTTLES DRAWN AEROBIC AND ANAEROBIC Blood Culture adequate volume   Culture   Final    NO GROWTH < 12 HOURS Performed at [facility], [address] [place], [place] [address]    Report Status PENDING  Incomplete  Culture, blood (Routine X 2) w Reflex to ID Panel     Status: None (Preliminary result)    Collection Time: 04/15/23 10:05 PM   Specimen: BLOOD  Result Value Ref Range Status   Specimen Description BLOOD BLOOD LEFT ARM  Final   Special Requests   Final    BOTTLES DRAWN AEROBIC AND ANAEROBIC Blood Culture adequate volume   Culture   Final    NO GROWTH < 12 HOURS Performed at [facility], [address] [place], [place] [address]    Report Status PENDING  Incomplete     Radiology Studies: IR REMOVAL TUN ACCESS W/ PORT W/O FL MOD SED Result Date: 04/14/2023 INDICATION: 68 year old male with a chronic indwelling port catheter which was placed by interventional radiology in September of 2024. Patient is currently admitted with MRSA bacteremia and pneumonia. He requires port catheter explantation. EXAM: REMOVAL RIGHT IJ VEIN PORT-A-CATH MEDICATIONS: None ANESTHESIA/SEDATION: None FLUOROSCOPY TIME:  None COMPLICATIONS: None immediate. PROCEDURE: Informed written consent was obtained from the patient after a thorough discussion of the procedural risks, benefits and alternatives. All questions were addressed. Maximal Sterile Barrier Technique was utilized including caps, mask, sterile gowns, sterile gloves, sterile drape, hand hygiene and skin antiseptic. A timeout was performed prior to the initiation of the procedure. The right chest was prepped and draped in a sterile fashion. Lidocaine was utilized for local anesthesia. An incision was made over the previously healed surgical incision. Utilizing blunt dissection, the port catheter and reservoir were removed from the underlying subcutaneous tissue in their entirety. Securing sutures were also removed. The pocket was irrigated with a copious amount of sterile normal saline. The pocket was closed with interrupted 3-0 Vicryl stitches. The subcutaneous tissue was closed with 3-0 Vicryl interrupted subcutaneous stitches. A 4-0 Vicryl running subcuticular stitch was utilized to approximate the skin. Dermabond was applied. IMPRESSION:  Successful right IJ  vein Port-A-Cath explantation. Electronically Signed   By: Malachy Moan M.D.   On: 04/14/2023 12:06    Scheduled Meds:  acetaminophen  650 mg Oral Q6H   buPROPion  300 mg Oral Daily   Chlorhexidine Gluconate Cloth  6 each Topical Daily   dicyclomine  10 mg Oral TID AC & HS   enoxaparin (LOVENOX) injection  1 mg/kg Subcutaneous Q12H   furosemide  40 mg Intravenous BID   gabapentin  200 mg Oral TID   lactulose  10 g Oral TID   leptospermum manuka honey  1 Application Topical Daily   lidocaine  1 patch Transdermal Q24H   nystatin cream   Topical BID   polyethylene glycol  17 g Oral BID   predniSONE  30 mg Oral Q breakfast   warfarin  10 mg Oral ONCE-1600   Warfarin - Pharmacist Dosing Inpatient   Does not apply q1600   Continuous Infusions:  sodium chloride     vancomycin Stopped (04/15/23 1321)       LOS: 6 days    Time spent:   Debarah Crape, DO [facility]  To contact the attending physician between 7A-7P please use Epic Chat. To contact the covering physician during after hours 7P-7A, please review Amion.   04/16/2023, 9:20 AM   *This document has been created with the assistance of dictation software. Please excuse typographical errors. *

## 2023-04-17 ENCOUNTER — Encounter: Payer: Self-pay | Admitting: Internal Medicine

## 2023-04-17 ENCOUNTER — Telehealth (HOSPITAL_COMMUNITY): Payer: Self-pay | Admitting: Pharmacy Technician

## 2023-04-17 ENCOUNTER — Other Ambulatory Visit (HOSPITAL_COMMUNITY): Payer: Self-pay

## 2023-04-17 DIAGNOSIS — I2699 Other pulmonary embolism without acute cor pulmonale: Secondary | ICD-10-CM

## 2023-04-17 DIAGNOSIS — C7951 Secondary malignant neoplasm of bone: Secondary | ICD-10-CM

## 2023-04-17 DIAGNOSIS — B9562 Methicillin resistant Staphylococcus aureus infection as the cause of diseases classified elsewhere: Secondary | ICD-10-CM

## 2023-04-17 DIAGNOSIS — R6520 Severe sepsis without septic shock: Secondary | ICD-10-CM

## 2023-04-17 DIAGNOSIS — R7881 Bacteremia: Secondary | ICD-10-CM | POA: Diagnosis not present

## 2023-04-17 DIAGNOSIS — K5981 Ogilvie syndrome: Secondary | ICD-10-CM

## 2023-04-17 DIAGNOSIS — N179 Acute kidney failure, unspecified: Secondary | ICD-10-CM

## 2023-04-17 DIAGNOSIS — C61 Malignant neoplasm of prostate: Secondary | ICD-10-CM

## 2023-04-17 DIAGNOSIS — I76 Septic arterial embolism: Secondary | ICD-10-CM

## 2023-04-17 DIAGNOSIS — R918 Other nonspecific abnormal finding of lung field: Secondary | ICD-10-CM

## 2023-04-17 DIAGNOSIS — A419 Sepsis, unspecified organism: Secondary | ICD-10-CM | POA: Diagnosis not present

## 2023-04-17 DIAGNOSIS — J9601 Acute respiratory failure with hypoxia: Secondary | ICD-10-CM

## 2023-04-17 DIAGNOSIS — I10 Essential (primary) hypertension: Secondary | ICD-10-CM

## 2023-04-17 DIAGNOSIS — J189 Pneumonia, unspecified organism: Secondary | ICD-10-CM | POA: Diagnosis not present

## 2023-04-17 LAB — CBC WITH DIFFERENTIAL/PLATELET
Abs Immature Granulocytes: 0.5 10*3/uL — ABNORMAL HIGH (ref 0.00–0.07)
Basophils Absolute: 0 10*3/uL (ref 0.0–0.1)
Basophils Relative: 0 %
Eosinophils Absolute: 0.1 10*3/uL (ref 0.0–0.5)
Eosinophils Relative: 1 %
HCT: 32.1 % — ABNORMAL LOW (ref 39.0–52.0)
Hemoglobin: 10.8 g/dL — ABNORMAL LOW (ref 13.0–17.0)
Immature Granulocytes: 6 %
Lymphocytes Relative: 8 %
Lymphs Abs: 0.7 10*3/uL (ref 0.7–4.0)
MCH: 30.9 pg (ref 26.0–34.0)
MCHC: 33.6 g/dL (ref 30.0–36.0)
MCV: 91.7 fL (ref 80.0–100.0)
Monocytes Absolute: 0.7 10*3/uL (ref 0.1–1.0)
Monocytes Relative: 7 %
Neutro Abs: 7 10*3/uL (ref 1.7–7.7)
Neutrophils Relative %: 78 %
Platelets: 356 10*3/uL (ref 150–400)
RBC: 3.5 MIL/uL — ABNORMAL LOW (ref 4.22–5.81)
RDW: 14.4 % (ref 11.5–15.5)
Smear Review: NORMAL
WBC: 9 10*3/uL (ref 4.0–10.5)
nRBC: 0 % (ref 0.0–0.2)

## 2023-04-17 LAB — CATH TIP CULTURE: Culture: >=100000 — AB

## 2023-04-17 LAB — CULTURE, BLOOD (ROUTINE X 2)
Culture: NO GROWTH
Culture: NO GROWTH
Special Requests: ADEQUATE
Special Requests: ADEQUATE

## 2023-04-17 LAB — COMPREHENSIVE METABOLIC PANEL
ALT: 35 U/L (ref 0–44)
AST: 30 U/L (ref 15–41)
Albumin: 2.7 g/dL — ABNORMAL LOW (ref 3.5–5.0)
Alkaline Phosphatase: 59 U/L (ref 38–126)
Anion gap: 8 (ref 5–15)
BUN: 31 mg/dL — ABNORMAL HIGH (ref 8–23)
CO2: 24 mmol/L (ref 22–32)
Calcium: 9.2 mg/dL (ref 8.9–10.3)
Chloride: 106 mmol/L (ref 98–111)
Creatinine, Ser: 0.78 mg/dL (ref 0.61–1.24)
GFR, Estimated: >60 mL/min (ref >60)
Glucose, Bld: 95 mg/dL (ref 70–99)
Potassium: 2.7 mmol/L — CL (ref 3.5–5.1)
Sodium: 138 mmol/L (ref 135–145)
Total Bilirubin: 0.7 mg/dL (ref 0.0–1.2)
Total Protein: 5.9 g/dL — ABNORMAL LOW (ref 6.5–8.1)

## 2023-04-17 LAB — PROTIME-INR
INR: 3 — ABNORMAL HIGH (ref 0.8–1.2)
Prothrombin Time: 31.5 s — ABNORMAL HIGH (ref 11.4–15.2)

## 2023-04-17 LAB — ECHO TEE

## 2023-04-17 LAB — MAGNESIUM: Magnesium: 1.6 mg/dL — ABNORMAL LOW (ref 1.7–2.4)

## 2023-04-17 LAB — VANCOMYCIN, PEAK: Vancomycin Pk: 32 ug/mL (ref 30–40)

## 2023-04-17 LAB — PHOSPHORUS: Phosphorus: 2.6 mg/dL (ref 2.5–4.6)

## 2023-04-17 MED ORDER — POTASSIUM CHLORIDE 20 MEQ PO PACK
40.0000 meq | PACK | Freq: Once | ORAL | Status: AC
  Administered 2023-04-17: 40 meq via ORAL
  Filled 2023-04-17: qty 2

## 2023-04-17 MED ORDER — MAGNESIUM SULFATE 4 GM/100ML IV SOLN
4.0000 g | Freq: Once | INTRAVENOUS | Status: AC
  Administered 2023-04-17: 4 g via INTRAVENOUS
  Filled 2023-04-17: qty 100

## 2023-04-17 MED ORDER — WARFARIN SODIUM 2.5 MG PO TABS
2.5000 mg | ORAL_TABLET | Freq: Once | ORAL | Status: AC
  Administered 2023-04-17: 2.5 mg via ORAL
  Filled 2023-04-17: qty 1

## 2023-04-17 MED ORDER — POTASSIUM CHLORIDE 10 MEQ/100ML IV SOLN
10.0000 meq | INTRAVENOUS | Status: AC
  Administered 2023-04-17 (×4): 10 meq via INTRAVENOUS
  Filled 2023-04-17 (×4): qty 100

## 2023-04-17 MED ORDER — BUPROPION HCL ER (XL) 150 MG PO TB24
150.0000 mg | ORAL_TABLET | ORAL | Status: DC
Start: 2023-04-21 — End: 2023-04-25

## 2023-04-17 NOTE — Telephone Encounter (Signed)
Patient Product/process development scientist completed.    The patient is insured through [employer]. Patient has Medicare and is not eligible for a copay card, but may be able to apply for patient assistance or Medicare RX Payment Plan (Patient Must reach out to their plan, if eligible for payment plan), if available.    Ran test claim for linezolid 600 mg  and the current 14 day co-pay is $68.23.   This test claim was processed through [facility]- copay amounts may vary at other pharmacies due to pharmacy/plan contracts, or as the patient moves through the different stages of their insurance plan.     Roland Earl, CPHT Pharmacy Technician III Certified Patient Advocate [facility] Pharm[facility] Patient Advocate Team Direct Number: [phone]  Fax: [phone]

## 2023-04-17 NOTE — Progress Notes (Signed)
Date of Admission:  04/09/2023   T ID: [name] is a 68 y.o. male  Principal Problem:   HAP (hospital-acquired pneumonia) Active Problems:   Prostate cancer metastatic to bone [facility])   PE (pulmonary thromboembolism) (HCC)   Essential hypertension   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   Ogilvie syndrome   Severe sepsis (HCC)   AKI (acute kidney injury) (HCC)   MRSA bacteremia   Pulmonary infiltrates   Septic embolism (HCC)   Palliative care encounter   Acute respiratory failure with hypoxia (HCC)   Morbid obesity (HCC)    Subjective: Pt is feeling better No specific complaints  Medications:   acetaminophen  650 mg Oral Q6H   buPROPion  150 mg Oral Daily   Chlorhexidine Gluconate Cloth  6 each Topical Daily   dicyclomine  10 mg Oral TID AC & HS   furosemide  20 mg Oral Daily   gabapentin  200 mg Oral TID   lactulose  10 g Oral TID   leptospermum manuka honey  1 Application Topical Daily   lidocaine  1 patch Transdermal Q24H   nystatin cream   Topical BID   polyethylene glycol  17 g Oral BID   predniSONE  30 mg Oral Q breakfast   warfarin  2.5 mg Oral ONCE-1600   Warfarin - Pharmacist Dosing Inpatient   Does not apply q1600    Objective: Vital signs in last 24 hours: Patient Vitals for the past 24 hrs:  BP Temp Temp src Pulse Resp SpO2  04/17/23 0350 135/81 (!) 97.3 F (36.3 C) -- 63 20 95 %  04/16/23 2337 115/76 97.9 F (36.6 C) Oral 63 20 98 %  04/16/23 1955 132/72 97.9 F (36.6 C) Oral 68 20 96 %  04/16/23 1632 (!) 150/85 97.7 F (36.5 C) Oral 63 19 96 %      PHYSICAL EXAM:  General:awake and alert, no resp distress Previous Port site removed covered with dressing Lungs: Bilateral air entry Heart: S1-S2. Abdomen: Soft, non-tender,not distended. Bowel sounds normal. No masses Extremities: Bilateral AKA  skin: No rashes or lesions. Or bruising Lymph: Cervical, supraclavicular normal. Neurologic: Grossly non-focal  Lab  Results    Latest Ref Rng & Units 04/17/2023    4:22 AM 04/16/2023    6:03 AM 04/16/2023    2:11 AM  CBC  WBC 4.0 - 10.5 K/uL 9.0  9.8  10.4   Hemoglobin 13.0 - 17.0 g/dL 65.7  84.6  96.2   Hematocrit 39.0 - 52.0 % 32.1  34.2  34.7   Platelets 150 - 400 K/uL 356  403  403        Latest Ref Rng & Units 04/17/2023    4:22 AM 04/16/2023    6:03 AM 04/16/2023    2:11 AM  CMP  Glucose 70 - 99 mg/dL 95  952  841   BUN 8 - 23 mg/dL 31  38  38   Creatinine 0.61 - 1.24 mg/dL 3.24  4.01  0.27   Sodium 135 - 145 mmol/L 138  138  137   Potassium 3.5 - 5.1 mmol/L 2.7  3.6  3.7   Chloride 98 - 111 mmol/L 106  103  102   CO2 22 - 32 mmol/L 24  25  25    Calcium 8.9 - 10.3 mg/dL 9.2  9.6  9.4   Total Protein 6.5 - 8.1 g/dL 5.9  6.8  6.7   Total Bilirubin 0.0 -  1.2 mg/dL 0.7  0.5  0.6   Alkaline Phos 38 - 126 U/L 59  65  65   AST 15 - 41 U/L 30  35  36   ALT 0 - 44 U/L 35  36  36       Microbiology: [facility] MRSA on 04/09/23 both sets 2/7 [facility]- NG So far 2/9 cath tip sent for culture  Studies/Results: ECHO TEE Result Date: 04/17/2023    TRANSESOPHOGEAL ECHO REPORT   Patient Name:   [name] Date of Exam: 04/16/2023 Medical Rec #:  [id number]            Height:       58.0 in Accession #:    [id number]           Weight:       299.8 lb Date of Birth:  1955/03/31            BSA:          2.163 m Patient Age:    67 years             BP:           132/77 mmHg Patient Gender: M                    HR:           72 bpm. Exam Location:  [facility] Procedure: Transesophageal Echo and Color Doppler Indications:     Bacteremia  History:         Patient has prior history of Echocardiogram examinations, most                  recent 04/10/2023. Signs/Symptoms:Bacteremia and Shortness of                  Breath; Risk Factors:Hypertension.  Sonographer:     Mikki Harbor Sonographer#2:   Lucendia Herrlich Referring Phys:  [id number] CARALYN HUDSON Diagnosing Phys: Rozell Searing Custovic PROCEDURE: Unable to pass transesophogeal  probe. Sedation performed by different physician. The patient's vital signs; including heart rate, blood pressure, and oxygen saturation; remained stable throughout the procedure. The patient developed no complications during the procedure.  IMPRESSIONS  1. Left ventricular ejection fraction, by estimation, is 60 to 65%. The left ventricle has normal function. The left ventricle has no regional wall motion abnormalities.  2. Right ventricular systolic function is normal. The right ventricular size is normal.  3. Left atrial size was mildly dilated. No left atrial/left atrial appendage thrombus was detected.  4. The mitral valve is normal in structure. No evidence of mitral valve regurgitation. No evidence of mitral stenosis.  5. The aortic valve is normal in structure. Aortic valve regurgitation is not visualized. No aortic stenosis is present.  6. The inferior vena cava is normal in size with greater than 50% respiratory variability, suggesting right atrial pressure of 3 mmHg. Conclusion(s)/Recommendation(s): No evidence of vegetation/infective endocarditis on this transesophageael echocardiogram. FINDINGS  Left Ventricle: Left ventricular ejection fraction, by estimation, is 60 to 65%. The left ventricle has normal function. The left ventricle has no regional wall motion abnormalities. The left ventricular internal cavity size was normal in size. There is  no left ventricular hypertrophy. Right Ventricle: The right ventricular size is normal. No increase in right ventricular wall thickness. Right ventricular systolic function is normal. Left Atrium: Left atrial size was mildly dilated. No left atrial/left atrial appendage thrombus was detected. Right Atrium: Right atrial size was normal in size.  Pericardium: There is no evidence of pericardial effusion. Mitral Valve: The mitral valve is normal in structure. No evidence of mitral valve regurgitation. No evidence of mitral valve stenosis. Tricuspid Valve: The  tricuspid valve is normal in structure. Tricuspid valve regurgitation is not demonstrated. Aortic Valve: The aortic valve is normal in structure. Aortic valve regurgitation is not visualized. No aortic stenosis is present. Aorta: The aortic root is normal in size and structure. Venous: The inferior vena cava is normal in size with greater than 50% respiratory variability, suggesting right atrial pressure of 3 mmHg. IAS/Shunts: No atrial level shunt detected by color flow Doppler. Rozell Searing Custovic Electronically signed by Clotilde Dieter Signature Date/Time: 04/17/2023/9:04:51 AM    Final       Assessment/Plan:  MRSA bacteremia With port in place and multiple nodules in the lungs concern for septic emboli and endocarditis of tricuspid valve  repeat blood culture from 2/7 NG PORT  removed  04/14/23 and culture is MRSA TEE neg for endocarditis  On vancomycin adjusted to crcl Watch closely because of AKI Daptomycin is currently not an option because of  lung infiltrate Will need 4 weeks of antibiotic, after 2 weeks IV vanco 04/28/23 can switch to PO linezolid 600mg  PO BID for 2 more week Wellbutrin to be tapered and stopped  because of risk of serotonin syndrome with linezolid- Though It is a lower risk ffor serotonin syndrome compared to  SSRI /SNRI but he is on high dose and a long acting form. 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

## 2023-04-17 NOTE — Consult Note (Signed)
PHARMACY - ANTICOAGULATION CONSULT NOTE  Pharmacy Consult for Warfarin Indication: hx recurrent VTE  Patient Measurements: Height: 4\' 10"  (147.3 cm) Weight: (!) 136.2 kg (300 lb 4.8 oz) IBW/kg (Calculated) : 45.4  Labs: Recent Labs    04/09/23 2025 04/09/23 2132 04/10/23 0127 04/10/23 1051  HGB 10.9*  --   --  10.8*  HCT 31.4*  --   --  31.4*  PLT 299  --   --  229  APTT  --   --  55*  --   LABPROT  --   --  30.6*  --   INR  --   --  2.9*  --   CREATININE 3.89*  --   --  3.11*  TROPONINIHS  --  22*  --   --    Estimated Creatinine Clearance: 26.6 mL/min (A) (by C-G formula based on SCr of 3.11 mg/dL (H)).  Medical History: Past Medical History:  Diagnosis Date   Depression    History of blood clots 08/13/2011   History of left knee replacement    HTN (hypertension)    Lymphedema    lt leg-per pt   PE (pulmonary thromboembolism) (HCC)    Pressure injury of skin, unspecified injury stage, unspecified location    S/P AKA (above knee amputation) bilateral (HCC)    rt 11/24/07 and lt 01/11/17   PTA warfarin dosing: Warfarin dosing difficult to determine. Went to speak with patient who could not give a firm regimen. Base dose is 5 mg but phones in INR weekly with clinic. He estimates an average of warfarin 6 mg daily. Last dose was 2/4 morning.  Assessment: 68 y.o. 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 Patient was hospitalized last month for recurrent VTE. Patient was on warfarin at the time of that VTE, but at presentation INR was subtherapeutic at 1.2 due to lack of enoxaparin bridge, and so warfarin was seen as not a therapeutic failure. Pt has a large body habitus, and will require higher doses of warfarin. Unsure what his dose was outpatient if the clinic made any adjustments, so not sure what caused the  supratherapeutic INR. Last admission we had to give 10-15 mg doses to get INR therapeutic. No major DDI. Albumin is low at 2.8, which would allow more free warfarin.    Goal of Therapy:  INR 2-3 Monitor platelets by anticoagulation protocol: Yes  Monitoring:  Date INR Warfarin Dose   2/5 2.9 6 mg  2/6 4 Hold  2/7 4.5 Hold, Vit K 1mg  IV for upcoming port removal   2/8 2.8 HOLD for procedure  2/9 2.1 HOLD for procedure  2/10 1.9 10 mg  2/11 1.9 10 mg  2/12 3 2.5 mg   Plan:  INR is therapeutic but at the upper limit of goal. There was a big jump from 2/11 to 2/12, maybe pt is more sensitive to warfarin, then previous admission. Will give warfarin 2.5 mg x 1 tonight, predict INR may continue to trend up 2/13.  When pt received 7.5 mg x 2 doses last admission the INR dropped to 1.5 after receiving multiple 15 mg doses. Will stop enoxaparin. Goal should be to maintain INR > 2.   Paschal Dopp, PharmD Clinical Pharmacist 04/17/2023

## 2023-04-17 NOTE — TOC Progression Note (Signed)
Transition of Care [facility]) - Progression Note    Patient Details  Name: [name] MRN: [id number] Date of Birth: 1955-05-09  [facility] CM/SW Contact  Truddie Hidden, RN Phone Number: 04/17/2023, 4:02 PM  Clinical Narrative:    Spoke with patient regarding discharge plan to SNF. He stated he had just spoken with the MD and was informed he may need more treatment for infection while inpatient. He is hoping to continue therapy here and discharge home.  Patient is agreeable to referral to [facility]. Message sent to MD.  Referral sent to Sarah from [facility].          Expected Discharge Plan and Services                                               Social Determinants of Health (SDOH) Interventions SDOH Screenings   Food Insecurity: No Food Insecurity (04/11/2023)  Housing: Low Risk  (04/11/2023)  Transportation Needs: No Transportation Needs (04/11/2023)  Utilities: Not At Risk (04/11/2023)  Financial Resource Strain: High Risk (07/28/2021)   Received from [facility], [facility]  Physical Activity: Sufficiently Active (07/28/2021)   Received from [facility], [facility]  Social Connections: Moderately Integrated (04/11/2023)  Stress: Stress Concern Present (08/01/2021)   Received from [facility]  Tobacco Use: Medium Risk (04/11/2023)    Readmission Risk Interventions    03/25/2023   10:53 AM  Readmission Risk Prevention Plan  Transportation Screening Complete  PCP or Specialist Appt within 5-7 Days Complete  Home Care Screening Complete  Medication Review (RN CM) Complete

## 2023-04-17 NOTE — Progress Notes (Signed)
Critical Potassium of 2.7 reported to provider on-call, Randye Lobo via text page and orders received for replacement.

## 2023-04-17 NOTE — Progress Notes (Signed)
   04/17/23 1400  Spiritual Encounters  Type of Visit Follow up  Care provided to: Patient  Referral source Chaplain team  Reason for visit Advance directives  OnCall Visit No  Spiritual Framework  Presenting Themes Significant life change  Interventions  Spiritual Care Interventions Made Established relationship of care and support;Compassionate presence  Intervention Outcomes  Outcomes Awareness of support;Connection to spiritual care   Chaplain help patient with an advance directive and provided compassionate care

## 2023-04-17 NOTE — Progress Notes (Signed)
PT Cancellation Note  Patient Details Name: [name] MRN: [id number] DOB: 1955-11-28   Cancelled Treatment:    Reason Eval/Treat Not Completed: Medical issues which prohibited therapy (Chart reviewed, K+: 2.7 this date. Will continue to follow, resume therapy once appropriate.)   Federico Maiorino C 04/17/2023, 1:59 PM

## 2023-04-17 NOTE — Progress Notes (Signed)
PROGRESS NOTE    [name]  ZOX:[id number] DOB: [birth year] DOA: 04/09/2023 PCP: [name] Housekeeper, MD  Chief Complaint  Patient presents with   Shortness of Breath    Hospital Course:  [name] is 68 y.o. male with stage IV prostate cancer, recurrent VTE, hypertension, PVD status post AKA, decubitus ulcers, who was admitted on 1/18 with shortness of breath and was found to have recurrent DVT despite Eliquis and then Coumadin (not technically a failure as he was not bridged.)  He had been unable to afford Lovenox as an outpatient.  During hospitalization he was noted to have volume overload and was diuresed.  Stay was also complicated by ileus for stool impaction that was treated aggressively with a bowel regimen.  He was discharged in stable condition on 1/31.  He returned to the ED on 2/4 due to increasing confusion and shortness of breath.  He was found to be saturating 89% on room air.  EMS placed patient on CPAP prior to arrival.  In the ED he was found to be septic with fever of 101.9, respiratory rate 31, leukocytosis 20K, and hypoxia.  Chest x-ray revealed left basilar opacity.  He was placed on BiPAP due to work of breathing and hypoxemia.  He failed weaning trial due to work of breathing though he did have good oxygen saturations.  He was admitted for management of healthcare associated pneumonia and sepsis. Hospital stay has been complex.  CT revealed multiple septic emboli in the lungs.  We attempted port removal early on as this was suspected nidus of infection, however his INR was supratherapeutic which prevented port removal.  He did receive low-dose vitamin K on 2/7 and ultimately was able to have port removed on 2/9.  The tip culture is expectedly positive.  He underwent TEE on 2/11.  He had further respiratory compromise throughout admission and was on prolonged BiPAP.  Further respiratory compromise appeared to be secondary to acute CHF exacerbation.  He has  been started on IV diuresis and has had profound 15 L output since arrival.  He is now stabilizing on 2 L nasal cannula, and will need skilled nursing placement.  2/12: Patient currently stable, does not want to go to SNF, asking to complete IV antibiotics and then go home, stating that he had everything in place at home to help.  TEE was negative, per ID will need 4 weeks of antibiotics.  Subjective: Patient was feeling much improved and no new concern when seen today.  He wants to continue working with PT and does not want to go to any rehab place stating that he does not need some strength back and wants to go back to his home.  Objective: Vitals:   04/16/23 1632 04/16/23 1955 04/16/23 2337 04/17/23 0350  BP: (!) 150/85 132/72 115/76 135/81  Pulse: 63 68 63 63  Resp: 19 20 20 20   Temp: 97.7 F (36.5 C) 97.9 F (36.6 C) 97.9 F (36.6 C) (!) 97.3 F (36.3 C)  TempSrc: Oral Oral Oral   SpO2: 96% 96% 98% 95%  Weight:      Height:        Intake/Output Summary (Last 24 hours) at 04/17/2023 1407 Last data filed at 04/17/2023 1058 Gross per 24 hour  Intake 1200 ml  Output 600 ml  Net 600 ml   Filed Weights   04/09/23 2019  Weight: 136 kg    Examination: General.  Obese gentleman, in no acute distress. Pulmonary.  Lungs clear bilaterally, normal respiratory effort. CV.  Regular rate and rhythm, no JVD, rub or murmur. Abdomen.  Soft, nontender, nondistended, BS positive. CNS.  Alert and oriented .  No focal neurologic deficit. Extremities.  Bilateral AKA Psychiatry.  Judgment and insight appears normal.   Assessment & Plan:  Principal Problem:   HAP (hospital-acquired pneumonia) Active Problems:   Severe sepsis (HCC)   PE (pulmonary thromboembolism) (HCC)   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   Ogilvie syndrome   AKI (acute kidney injury) (HCC)   Prostate cancer metastatic to bone [facility])   Essential hypertension   MRSA bacteremia   Pulmonary  infiltrates   Septic embolism (HCC)   Palliative care encounter   Acute respiratory failure with hypoxia (HCC)   Morbid obesity (HCC)    Severe sepsis - Criteria met with hypoxemia, fever, AKI, leukocytosis, tachypnea.  Source: Pneumonia and bacteremia - Antibiotics as below  MRSA bacteremia - Patient is on vancomycin - ID consulted - On arrival port in place and there are multiple nodules in the lungs concerning for septic emboli - TEE without evidence of endocarditis  - Port removed 2/9.  Tip culture positive for MRSA  Healthcare associated pneumonia +/- septic emboli Hypoxic respiratory failure - Respiratory status much improved now.  Has required BiPAP multiple times throughout this admission.  Has now been successfully weaned to room air Respiratory failure secondary to pneumonia, septic emboli, and then pulmonary edema -- Started on BID Lasix 2/8 and has significant improvement. Tapered to home dose now. - Has been on prednisone, begin tapering 2/10. - Procalcitonin: 5.8 - Leukocytosis -now resolved  AKI - Baseline creatinine 0.98, creatinine 3.89 on arrival - Lasix was added and creatinine has improved significantly.  Is now back to baseline -- ID to consider linezolid if needed, but currently patient appears to be tolerating vancomycin. - Status post IVF. -- Strict Ins and Outs.   Pulmonary embolism Supratherapeutic INR - Has had recurrent VTEs on Eliquis, now on warfarin therapy --  Received low-dose vitamin K  2/7 for INR 4.5, needed port removal. Avoid high-dose vitamin K as he is very high risk for coagulopathy given his sepsis, malignancy, and history of recurrent VTE -- INR now <2, pharm consult to bridge with lovenox -- Pharmacy to manage Warfarin dosing  HF preserved EF, with acute exacerbation - On arrival BNP 110, and patient did not appear acutely volume overloaded.  He was septic and received some IV fluids.  Suspect he became overloaded during that  time. - He has now been started on IV diuresis and has put out Over 15L since admission. -- Diuresis slowly now, will back off on lasix today. Resume home dose 20mg  - ECHO: EF 55-60%, no RWMA, mildly dilated LV, diastolic parameters indeterminate  Hypokalemia - Secondary to IV Lasix - Continue to replace PRN  Prostate cancer metastatic to bone - Follows with oncology outpatient, currently holding treatment in setting of sepsis - Port removal as above - Palliative consult - Patient was on hospice outpatient but he misunderstood the services.  He is not interested in hospice at this time  Hypertension - Continue home meds  Back and flank pain - Suspect this is secondary to his multiple osseous lesions.  Continue with as needed Dilaudid and Percocet.  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 PT/Ot consulted, improving now.  DVT prophylaxis: Warfarin with lovenox bridge   Code Status: Limited: Do not attempt resuscitation (DNR) -DNR-LIMITED -Do Not Intubate/DNI  Family Communication: discussed directly with patient Disposition:  Status is: Inpatient, workup ongoing.  Currently necessitates IV antibiotics.  Patient is nearing his baseline now.  Will need to  discharge directly to rehab, and then from there home with home health. TOC consulted for rehab planning.  Patient currently does not want to go to rehab    Consultants:  ID Cardiology (TEE) IR (port removal)  Procedures:    Antimicrobials:  Anti-infectives (From admission, onward)    Start     Dose/Rate Route Frequency Ordered Stop   04/15/23 0800  vancomycin (VANCOREADY) IVPB 1500 mg/300 mL        1,500 mg 100 mL/hr over 180 Minutes Intravenous Every 24 hours 04/15/23 0649     04/13/23 1030  vancomycin variable dose per unstable renal function (pharmacist dosing)  Status:  Discontinued         Does not apply See admin instructions 04/13/23 1031 04/15/23 0711   04/12/23 1000  vancomycin (VANCOREADY) IVPB 1500 mg/300 mL  Status:  Discontinued        1,500 mg 100 mL/hr over 180 Minutes Intravenous Every 24 hours 04/12/23 0831 04/13/23 1031   04/11/23 1000  vancomycin (VANCOREADY) IVPB 1500 mg/300 mL        1,500 mg 75 mL/hr over 240 Minutes Intravenous  Once 04/11/23 0828 04/11/23 1358   04/10/23 0308  vancomycin variable dose per unstable renal function (pharmacist dosing)  Status:  Discontinued         Does not apply See admin instructions 04/10/23 0308 04/12/23 0831   04/10/23 0145  vancomycin (VANCOREADY) IVPB 1500 mg/300 mL        1,500 mg 100 mL/hr over 180 Minutes Intravenous  Once 04/10/23 0131 04/10/23 0546   04/10/23 0130  vancomycin (VANCOCIN) IVPB 1000 mg/200 mL premix  Status:  Discontinued        1,000 mg 200 mL/hr over 60 Minutes Intravenous  Once 04/10/23 0120 04/10/23 0131   04/09/23 2100  vancomycin (VANCOCIN) IVPB 1000 mg/200 mL premix        1,000 mg 200 mL/hr over 60 Minutes Intravenous  Once 04/09/23 2047 04/10/23 0113   04/09/23 2100  ceFEPIme (MAXIPIME) 2 g in sodium chloride 0.9 % 100 mL IVPB        2 g 200 mL/hr over 30 Minutes Intravenous  Once 04/09/23 2047 04/10/23 0113       Data Reviewed: I have personally reviewed following labs and imaging  studies CBC: Recent Labs  Lab 04/14/23 0502 04/15/23 0548 04/16/23 0211 04/16/23 0603 04/17/23 0422  WBC 13.2* 10.6* 10.4 9.8 9.0  NEUTROABS 11.7* 8.9* 9.1* 8.3* 7.0  HGB 10.6* 10.5* 11.4* 11.5* 10.8*  HCT 31.8* 30.9* 34.7* 34.2* 32.1*  MCV 93.0 90.1 93.5 91.0 91.7  PLT 298 376 403* 403* 356   Basic Metabolic  Panel: Recent Labs  Lab 04/14/23 0502 04/15/23 0548 04/15/23 1528 04/16/23 0211 04/16/23 0603 04/17/23 0422  NA 134* 138  --  137 138 138  K 3.9 2.7* 2.8* 3.7 3.6 2.7*  CL 104 104  --  102 103 106  CO2 20* 24  --  25 25 24   GLUCOSE 145* 116*  --  156* 126* 95  BUN 60* 47*  --  38* 38* 31*  CREATININE 1.66* 0.95  --  1.00 0.92 0.78  CALCIUM 9.9 9.9  --  9.4 9.6 9.2  MG 1.9 1.9  --  1.7 1.8 1.6*  PHOS 3.4 2.7  --  3.2 3.3 2.6   GFR: Estimated Creatinine Clearance: 103.4 mL/min (by C-G formula based on SCr of 0.78 mg/dL). Liver Function Tests: Recent Labs  Lab 04/14/23 0502 04/15/23 0548 04/16/23 0211 04/16/23 0603 04/17/23 0422  AST 32 33 36 35 30  ALT 24 31 36 36 35  ALKPHOS 65 60 65 65 59  BILITOT 1.3* 0.5 0.6 0.5 0.7  PROT 6.5 6.4* 6.7 6.8 5.9*  ALBUMIN 3.0* 2.8* 2.8* 2.8* 2.7*   CBG: No results for input(s): "GLUCAP" in the last 168 hours.   Recent Results (from the past 240 hours)  Resp panel by RT-PCR (RSV, Flu A&B, Covid) Anterior Nasal Swab     Status: None   Collection Time: 04/09/23  8:19 PM   Specimen: Anterior Nasal Swab  Result Value Ref Range Status   SARS Coronavirus 2 by RT PCR NEGATIVE NEGATIVE Final    Comment: (NOTE) SARS-CoV-2 target nucleic acids are NOT DETECTED.  The SARS-CoV-2 RNA is generally detectable in upper respiratory specimens during the acute phase of infection. The lowest concentration of SARS-CoV-2 viral copies this assay can detect is 138 copies/mL. A negative result does not preclude SARS-Cov-2 infection and should not be used as the sole basis for treatment or other patient management decisions. A negative  result may occur with  improper specimen collection/handling, submission of specimen other than nasopharyngeal swab, presence of viral mutation(s) within the areas targeted by this assay, and inadequate number of viral copies(<138 copies/mL). A negative result must be combined with clinical observations, patient history, and epidemiological information. The expected result is Negative.  Fact Sheet for Patients:  BloggerCourse.com  Fact Sheet for Healthcare Providers:  SeriousBroker.it  This test is no t yet approved or cleared by the [place] FDA and  has been authorized for detection and/or diagnosis of SARS-CoV-2 by FDA under an Emergency Use Authorization (EUA). This EUA will remain  in effect (meaning this test can be used) for the duration of the COVID-19 declaration under Section 564(b)(1) of the Act, 21 U.S.C.section 360bbb-3(b)(1), unless the authorization is terminated  or revoked sooner.       Influenza A by PCR NEGATIVE NEGATIVE Final   Influenza B by PCR NEGATIVE NEGATIVE Final    Comment: (NOTE) The Xpert Xpress SARS-CoV-2/FLU/RSV plus assay is intended as an aid in the diagnosis of influenza from Nasopharyngeal swab specimens and should not be used as a sole basis for treatment. Nasal washings and aspirates are unacceptable for Xpert Xpress SARS-CoV-2/FLU/RSV testing.  Fact Sheet for Patients: BloggerCourse.com  Fact Sheet for Healthcare Providers: SeriousBroker.it  This test is not yet approved or cleared by the [place] FDA and has been authorized for detection and/or diagnosis of SARS-CoV-2 by FDA under an Emergency Use Authorization (EUA). This EUA will remain in effect (meaning this test can be used) for the duration  of the COVID-19 declaration under Section 564(b)(1) of the Act, 21 U.S.C. section 360bbb-3(b)(1), unless the authorization is  terminated or revoked.     Resp Syncytial Virus by PCR NEGATIVE NEGATIVE Final    Comment: (NOTE) Fact Sheet for Patients: BloggerCourse.com  Fact Sheet for Healthcare Providers: SeriousBroker.it  This test is not yet approved or cleared by the [place] FDA and has been authorized for detection and/or diagnosis of SARS-CoV-2 by FDA under an Emergency Use Authorization (EUA). This EUA will remain in effect (meaning this test can be used) for the duration of the COVID-19 declaration under Section 564(b)(1) of the Act, 21 U.S.C. section 360bbb-3(b)(1), unless the authorization is terminated or revoked.  Performed at [facility], [address] [place], [place] [address]   Blood Culture (routine x 2)     Status: Abnormal (Preliminary result)   Collection Time: 04/09/23  8:19 PM   Specimen: Right Antecubital; Blood  Result Value Ref Range Status   Specimen Description   Final    RIGHT ANTECUBITAL BLOOD Performed at [facility], [address] [place], [place] [address]    Special Requests   Final    BOTTLES DRAWN AEROBIC AND ANAEROBIC Blood Culture results may not be optimal due to an inadequate volume of blood received in culture bottles Performed at [facility], [address] [place], [place] [address]    Culture  Setup Time   Final    GRAM POSITIVE COCCI IN BOTH AEROBIC AND ANAEROBIC BOTTLES CRITICAL RESULT CALLED TO, READ BACK BY AND VERIFIED WITH: TREY GREENWOOD PHARMD 1137 04/10/23 HNM GRAM STAIN REVIEWED-AGREE WITH RESULT DRT    Culture (A)  Final    METHICILLIN RESISTANT STAPHYLOCOCCUS AUREUS Sent to [facility] for further susceptibility testing. Performed at [facility], [name] [place], [place] [address]    Report Status PENDING  Incomplete   Organism ID, Bacteria METHICILLIN RESISTANT STAPHYLOCOCCUS AUREUS  Final      Susceptibility   Methicillin resistant  staphylococcus aureus - MIC*    CIPROFLOXACIN >=8 RESISTANT Resistant     ERYTHROMYCIN >=8 RESISTANT Resistant     GENTAMICIN <=0.5 SENSITIVE Sensitive     OXACILLIN >=4 RESISTANT Resistant     TETRACYCLINE <=1 SENSITIVE Sensitive     VANCOMYCIN 1 SENSITIVE Sensitive     TRIMETH/SULFA <=10 SENSITIVE Sensitive     CLINDAMYCIN <=0.25 SENSITIVE Sensitive     RIFAMPIN <=0.5 SENSITIVE Sensitive     Inducible Clindamycin NEGATIVE Sensitive     LINEZOLID 2 SENSITIVE Sensitive     * METHICILLIN RESISTANT STAPHYLOCOCCUS AUREUS  Blood Culture (routine x 2)     Status: Abnormal   Collection Time: 04/09/23  8:19 PM   Specimen: BLOOD RIGHT FOREARM  Result Value Ref Range Status   Specimen Description   Final    BLOOD RIGHT FOREARM Performed at [facility], [address] [place], [place] [address]    Special Requests   Final    BOTTLES DRAWN AEROBIC AND ANAEROBIC Blood Culture results may not be optimal due to an inadequate volume of blood received in culture bottles Performed at [facility], [address] [place], [place] [address]    Culture  Setup Time   Final    GRAM POSITIVE COCCI AEROBIC BOTTLE ONLY CRITICAL VALUE NOTED.  VALUE IS CONSISTENT WITH PREVIOUSLY REPORTED AND CALLED VALUE. GRAM STAIN REVIEWED-AGREE WITH RESULT DRT    Culture (A)  Final    STAPHYLOCOCCUS AUREUS  SUSCEPTIBILITIES PERFORMED ON PREVIOUS CULTURE WITHIN THE LAST 5 DAYS. Performed at [facility], [address], [place], [place] [address]    Report Status 04/12/2023 FINAL  Final  Blood Culture ID Panel (Reflexed)     Status: Abnormal   Collection Time: 04/09/23  8:19 PM  Result Value Ref Range Status   Enterococcus faecalis NOT DETECTED NOT DETECTED Final   Enterococcus Faecium NOT DETECTED NOT DETECTED Final   Listeria monocytogenes NOT DETECTED NOT DETECTED Final   Staphylococcus species DETECTED (A) NOT DETECTED Final    Comment: CRITICAL RESULT CALLED TO, READ BACK BY AND  VERIFIED WITH: Gloriann Loan PHARMD 1137 04/10/23 HNM    Staphylococcus aureus (BCID) DETECTED (A) NOT DETECTED Final    Comment: Methicillin (oxacillin)-resistant Staphylococcus aureus (MRSA). MRSA is predictably resistant to beta-lactam antibiotics (except ceftaroline). Preferred therapy is vancomycin unless clinically contraindicated. Patient requires contact precautions if  hospitalized. CRITICAL RESULT CALLED TO, READ BACK BY AND VERIFIED WITH: Gloriann Loan PHARMD 1137 04/10/23 HNM    Staphylococcus epidermidis NOT DETECTED NOT DETECTED Final   Staphylococcus lugdunensis NOT DETECTED NOT DETECTED Final   Streptococcus species NOT DETECTED NOT DETECTED Final   Streptococcus agalactiae NOT DETECTED NOT DETECTED Final   Streptococcus pneumoniae NOT DETECTED NOT DETECTED Final   Streptococcus pyogenes NOT DETECTED NOT DETECTED Final   A.calcoaceticus-baumannii NOT DETECTED NOT DETECTED Final   Bacteroides fragilis NOT DETECTED NOT DETECTED Final   Enterobacterales NOT DETECTED NOT DETECTED Final   Enterobacter cloacae complex NOT DETECTED NOT DETECTED Final   Escherichia coli NOT DETECTED NOT DETECTED Final   Klebsiella aerogenes NOT DETECTED NOT DETECTED Final   Klebsiella oxytoca NOT DETECTED NOT DETECTED Final   Klebsiella pneumoniae NOT DETECTED NOT DETECTED Final   Proteus species NOT DETECTED NOT DETECTED Final   Salmonella species NOT DETECTED NOT DETECTED Final   Serratia marcescens NOT DETECTED NOT DETECTED Final   Haemophilus influenzae NOT DETECTED NOT DETECTED Final   Neisseria meningitidis NOT DETECTED NOT DETECTED Final   Pseudomonas aeruginosa NOT DETECTED NOT DETECTED Final   Stenotrophomonas maltophilia NOT DETECTED NOT DETECTED Final   Candida albicans NOT DETECTED NOT DETECTED Final   Candida auris NOT DETECTED NOT DETECTED Final   Candida glabrata NOT DETECTED NOT DETECTED Final   Candida krusei NOT DETECTED NOT DETECTED Final   Candida parapsilosis NOT DETECTED  NOT DETECTED Final   Candida tropicalis NOT DETECTED NOT DETECTED Final   Cryptococcus neoformans/gattii NOT DETECTED NOT DETECTED Final   Meth resistant mecA/C and MREJ DETECTED (A) NOT DETECTED Final    Comment: CRITICAL RESULT CALLED TO, READ BACK BY AND VERIFIED WITHGloriann Loan PHARMD 1137 04/10/23 HNM Performed at [facility], [address] [place], [place] [address]   Respiratory (~20 pathogens) panel by PCR     Status: None   Collection Time: 04/10/23  1:27 AM   Specimen: Nasopharyngeal Swab; Respiratory  Result Value Ref Range Status   Adenovirus NOT DETECTED NOT DETECTED Final   Coronavirus 229E NOT DETECTED NOT DETECTED Final    Comment: (NOTE) The Coronavirus on the Respiratory Panel, DOES NOT test for the novel  Coronavirus (2019 nCoV)    Coronavirus HKU1 NOT DETECTED NOT DETECTED Final   Coronavirus NL63 NOT DETECTED NOT DETECTED Final   Coronavirus OC43 NOT DETECTED NOT DETECTED Final   Metapneumovirus NOT DETECTED NOT DETECTED Final   Rhinovirus / Enterovirus NOT DETECTED NOT DETECTED Final   Influenza A NOT DETECTED NOT DETECTED Final  Influenza B NOT DETECTED NOT DETECTED Final   Parainfluenza Virus 1 NOT DETECTED NOT DETECTED Final   Parainfluenza Virus 2 NOT DETECTED NOT DETECTED Final   Parainfluenza Virus 3 NOT DETECTED NOT DETECTED Final   Parainfluenza Virus 4 NOT DETECTED NOT DETECTED Final   Respiratory Syncytial Virus NOT DETECTED NOT DETECTED Final   Bordetella pertussis NOT DETECTED NOT DETECTED Final   Bordetella Parapertussis NOT DETECTED NOT DETECTED Final   Chlamydophila pneumoniae NOT DETECTED NOT DETECTED Final   Mycoplasma pneumoniae NOT DETECTED NOT DETECTED Final    Comment: Performed at [facility], [address], [place], [place] [address]  Urine Culture     Status: None   Collection Time: 04/10/23  1:27 AM   Specimen: Urine, Random  Result Value Ref Range Status   Specimen Description   Final    URINE,  RANDOM Performed at [facility], [address] [place], [place] [address]    Special Requests   Final    NONE Reflexed from [id number] Performed at [facility], [address] [place], [place] [address]    Culture   Final    NO GROWTH Performed at [facility], [address] [place], [place] [address]    Report Status 04/11/2023 FINAL  Final  C Difficile Quick Screen w PCR reflex     Status: None   Collection Time: 04/10/23  2:00 PM   Specimen: STOOL  Result Value Ref Range Status   C Diff antigen NEGATIVE NEGATIVE Final   C Diff toxin NEGATIVE NEGATIVE Final   C Diff interpretation No C. difficile detected.  Final    Comment: Performed at [facility], [address] [place], [place] [address]  MRSA Next Gen by PCR, Nasal     Status: Abnormal   Collection Time: 04/10/23  7:18 PM   Specimen: Nasal Mucosa; Nasal Swab  Result Value Ref Range Status   MRSA by PCR Next Gen DETECTED (A) NOT DETECTED Final    Comment: RESULT CALLED TO, READ BACK BY AND VERIFIED WITH:  JACOB MOORE AT 2346 04/10/23 JG (NOTE) The GeneXpert MRSA Assay (FDA approved for NASAL specimens only), is one component of a comprehensive MRSA colonization surveillance program. It is not intended to diagnose MRSA infection nor to guide or monitor treatment for MRSA infections. Test performance is not FDA approved in patients less than 40 years old. Performed at [facility], [address] [place], [place] [address]   Culture, blood (Routine X 2) w Reflex to ID Panel     Status: None   Collection Time: 04/12/23  5:00 AM   Specimen: BLOOD  Result Value Ref Range Status   Specimen Description BLOOD UNKNOWN  Final   Special Requests   Final    BOTTLES DRAWN AEROBIC AND ANAEROBIC Blood Culture adequate volume   Culture   Final    NO GROWTH 5 DAYS Performed at [facility], [address], [place], [place] [address]    Report Status 04/17/2023 FINAL   Final  MIC (1 Drug)-blood culture; 04/09/2023; BLOOD RIGHT ARM; MRSA; Daptomycin     Status: Abnormal   Collection Time: 04/12/23  9:40 AM   Specimen: BLOOD RIGHT ARM  Result Value Ref Range Status   Min Inhibitory Conc (1 Drug) Preliminary report (A)  Final    Comment: (NOTE) Performed At: [facility] [address] [place], [place] [id number] Jolene Schimke MD UU:[id number]    Source [phone]  Final    Comment: Performed at [facility], [address] [place], [place] [address]  Culture, blood (Routine X 2) w Reflex to ID Panel     Status: None   Collection Time: 04/12/23  1:35 PM   Specimen: BLOOD  Result Value Ref Range Status   Specimen Description BLOOD BLOOD LEFT FOREARM  Final   Special Requests   Final    BOTTLES DRAWN AEROBIC AND ANAEROBIC Blood Culture adequate volume   Culture   Final    NO GROWTH 5 DAYS Performed at [facility], [address], [place], [place] [address]    Report Status 04/17/2023 FINAL  Final  Cath Tip Culture     Status: Abnormal   Collection Time: 04/14/23 12:00 PM   Specimen: Catheter Tip; Other  Result Value Ref Range Status   Specimen Description   Final    CATH TIP Performed at [facility], [address] [place], [place] [address]    Special Requests   Final    NONE Performed at [facility], [address] [place], [place] [address]    Culture (A)  Final    >=100,000 COLONIES/mL METHICILLIN RESISTANT STAPHYLOCOCCUS AUREUS   Report Status 04/17/2023 FINAL  Final   Organism ID, Bacteria METHICILLIN RESISTANT STAPHYLOCOCCUS AUREUS (A)  Final      Susceptibility   Methicillin resistant staphylococcus aureus - MIC*    CIPROFLOXACIN >=8 RESISTANT Resistant     ERYTHROMYCIN >=8 RESISTANT Resistant     GENTAMICIN <=0.5 SENSITIVE Sensitive     OXACILLIN >=4 RESISTANT Resistant     TETRACYCLINE <=1 SENSITIVE Sensitive     VANCOMYCIN 1 SENSITIVE Sensitive     TRIMETH/SULFA <=10 SENSITIVE  Sensitive     CLINDAMYCIN <=0.25 SENSITIVE Sensitive     RIFAMPIN <=0.5 SENSITIVE Sensitive     Inducible Clindamycin NEGATIVE Sensitive     LINEZOLID 2 SENSITIVE Sensitive     * >=100,000 COLONIES/mL METHICILLIN RESISTANT STAPHYLOCOCCUS AUREUS  Culture, blood (Routine X 2) w Reflex to ID Panel     Status: None (Preliminary result)   Collection Time: 04/15/23 10:05 PM   Specimen: BLOOD  Result Value Ref Range Status   Specimen Description BLOOD BLOOD RIGHT ARM  Final   Special Requests   Final    BOTTLES DRAWN AEROBIC AND ANAEROBIC Blood Culture adequate volume   Culture   Final    NO GROWTH 2 DAYS Performed at [facility], [address], [place], [place] [address]    Report Status PENDING  Incomplete  Culture, blood (Routine X 2) w Reflex to ID Panel     Status: None (Preliminary result)   Collection Time: 04/15/23 10:05 PM   Specimen: BLOOD  Result Value Ref Range Status   Specimen Description BLOOD BLOOD LEFT ARM  Final   Special Requests   Final    BOTTLES DRAWN AEROBIC AND ANAEROBIC Blood Culture adequate volume   Culture   Final    NO GROWTH 2 DAYS Performed at [facility], [address], [place], [place] [address]    Report Status PENDING  Incomplete     Radiology Studies: ECHO TEE Result Date: 04/17/2023    TRANSESOPHOGEAL ECHO REPORT   Patient Name:   [name] Date of Exam: 04/16/2023 Medical Rec #:  [id number]            Height:       58.0 in Accession #:    [id number]  Weight:       299.8 lb Date of Birth:  02-26-1956            BSA:          2.163 m Patient Age:    41 years             BP:           132/77 mmHg Patient Gender: M                    HR:           72 bpm. Exam Location:  [facility] Procedure: Transesophageal Echo and Color Doppler Indications:     Bacteremia  History:         Patient has prior history of Echocardiogram examinations, most                  recent 04/10/2023. Signs/Symptoms:Bacteremia and Shortness of                   Breath; Risk Factors:Hypertension.  Sonographer:     Mikki Harbor Sonographer#2:   Lucendia Herrlich Referring Phys:  [id number] CARALYN HUDSON Diagnosing Phys: Rozell Searing Custovic PROCEDURE: Unable to pass transesophogeal probe. Sedation performed by different physician. The patient's vital signs; including heart rate, blood pressure, and oxygen saturation; remained stable throughout the procedure. The patient developed no complications during the procedure.  IMPRESSIONS  1. Left ventricular ejection fraction, by estimation, is 60 to 65%. The left ventricle has normal function. The left ventricle has no regional wall motion abnormalities.  2. Right ventricular systolic function is normal. The right ventricular size is normal.  3. Left atrial size was mildly dilated. No left atrial/left atrial appendage thrombus was detected.  4. The mitral valve is normal in structure. No evidence of mitral valve regurgitation. No evidence of mitral stenosis.  5. The aortic valve is normal in structure. Aortic valve regurgitation is not visualized. No aortic stenosis is present.  6. The inferior vena cava is normal in size with greater than 50% respiratory variability, suggesting right atrial pressure of 3 mmHg. Conclusion(s)/Recommendation(s): No evidence of vegetation/infective endocarditis on this transesophageael echocardiogram. FINDINGS  Left Ventricle: Left ventricular ejection fraction, by estimation, is 60 to 65%. The left ventricle has normal function. The left ventricle has no regional wall motion abnormalities. The left ventricular internal cavity size was normal in size. There is  no left ventricular hypertrophy. Right Ventricle: The right ventricular size is normal. No increase in right ventricular wall thickness. Right ventricular systolic function is normal. Left Atrium: Left atrial size was mildly dilated. No left atrial/left atrial appendage thrombus was detected. Right Atrium: Right atrial size was normal in  size. Pericardium: There is no evidence of pericardial effusion. Mitral Valve: The mitral valve is normal in structure. No evidence of mitral valve regurgitation. No evidence of mitral valve stenosis. Tricuspid Valve: The tricuspid valve is normal in structure. Tricuspid valve regurgitation is not demonstrated. Aortic Valve: The aortic valve is normal in structure. Aortic valve regurgitation is not visualized. No aortic stenosis is present. Aorta: The aortic root is normal in size and structure. Venous: The inferior vena cava is normal in size with greater than 50% respiratory variability, suggesting right atrial pressure of 3 mmHg. IAS/Shunts: No atrial level shunt detected by color flow Doppler. Rozell Searing Custovic Electronically signed by Clotilde Dieter Signature Date/Time: 04/17/2023/9:04:51 AM    Final     Scheduled Meds:  acetaminophen  650 mg Oral Q6H  buPROPion  150 mg Oral Daily   Chlorhexidine Gluconate Cloth  6 each Topical Daily   dicyclomine  10 mg Oral TID AC & HS   furosemide  20 mg Oral Daily   gabapentin  200 mg Oral TID   lactulose  10 g Oral TID   leptospermum manuka honey  1 Application Topical Daily   lidocaine  1 patch Transdermal Q24H   nystatin cream   Topical BID   polyethylene glycol  17 g Oral BID   predniSONE  30 mg Oral Q breakfast   warfarin  2.5 mg Oral ONCE-1600   Warfarin - Pharmacist Dosing Inpatient   Does not apply q1600   Continuous Infusions:  vancomycin 1,500 mg (04/17/23 1004)       LOS: 7 days    Time spent:   Arnetha Courser, MD [facility]  To contact the attending physician between 7A-7P please use Epic Chat. To contact the covering physician during after hours 7P-7A, please review Amion.   04/17/2023, 2:07 PM   *This document has been created with the assistance of dictation software. Please excuse typographical errors. *

## 2023-04-18 DIAGNOSIS — K5981 Ogilvie syndrome: Secondary | ICD-10-CM | POA: Diagnosis not present

## 2023-04-18 DIAGNOSIS — J189 Pneumonia, unspecified organism: Secondary | ICD-10-CM | POA: Diagnosis not present

## 2023-04-18 DIAGNOSIS — A419 Sepsis, unspecified organism: Secondary | ICD-10-CM | POA: Diagnosis not present

## 2023-04-18 DIAGNOSIS — R7881 Bacteremia: Secondary | ICD-10-CM | POA: Diagnosis not present

## 2023-04-18 DIAGNOSIS — N179 Acute kidney failure, unspecified: Secondary | ICD-10-CM | POA: Diagnosis not present

## 2023-04-18 DIAGNOSIS — I76 Septic arterial embolism: Secondary | ICD-10-CM | POA: Diagnosis not present

## 2023-04-18 DIAGNOSIS — B9562 Methicillin resistant Staphylococcus aureus infection as the cause of diseases classified elsewhere: Secondary | ICD-10-CM | POA: Diagnosis not present

## 2023-04-18 LAB — COMPREHENSIVE METABOLIC PANEL
ALT: 30 U/L (ref 0–44)
AST: 24 U/L (ref 15–41)
Albumin: 2.6 g/dL — ABNORMAL LOW (ref 3.5–5.0)
Alkaline Phosphatase: 58 U/L (ref 38–126)
Anion gap: 7 (ref 5–15)
BUN: 21 mg/dL (ref 8–23)
CO2: 26 mmol/L (ref 22–32)
Calcium: 9.3 mg/dL (ref 8.9–10.3)
Chloride: 107 mmol/L (ref 98–111)
Creatinine, Ser: 0.72 mg/dL (ref 0.61–1.24)
GFR, Estimated: >60 mL/min (ref >60)
Glucose, Bld: 91 mg/dL (ref 70–99)
Potassium: 3.1 mmol/L — ABNORMAL LOW (ref 3.5–5.1)
Sodium: 140 mmol/L (ref 135–145)
Total Bilirubin: 0.6 mg/dL (ref 0.0–1.2)
Total Protein: 5.8 g/dL — ABNORMAL LOW (ref 6.5–8.1)

## 2023-04-18 LAB — CBC WITH DIFFERENTIAL/PLATELET
Abs Immature Granulocytes: 0.41 10*3/uL — ABNORMAL HIGH (ref 0.00–0.07)
Basophils Absolute: 0 10*3/uL (ref 0.0–0.1)
Basophils Relative: 0 %
Eosinophils Absolute: 0.1 10*3/uL (ref 0.0–0.5)
Eosinophils Relative: 2 %
HCT: 31.4 % — ABNORMAL LOW (ref 39.0–52.0)
Hemoglobin: 10.1 g/dL — ABNORMAL LOW (ref 13.0–17.0)
Immature Granulocytes: 5 %
Lymphocytes Relative: 8 %
Lymphs Abs: 0.8 10*3/uL (ref 0.7–4.0)
MCH: 30.3 pg (ref 26.0–34.0)
MCHC: 32.2 g/dL (ref 30.0–36.0)
MCV: 94.3 fL (ref 80.0–100.0)
Monocytes Absolute: 0.7 10*3/uL (ref 0.1–1.0)
Monocytes Relative: 7 %
Neutro Abs: 7 10*3/uL (ref 1.7–7.7)
Neutrophils Relative %: 78 %
Platelets: 319 10*3/uL (ref 150–400)
RBC: 3.33 MIL/uL — ABNORMAL LOW (ref 4.22–5.81)
RDW: 14.3 % (ref 11.5–15.5)
WBC: 9 10*3/uL (ref 4.0–10.5)
nRBC: 0 % (ref 0.0–0.2)

## 2023-04-18 LAB — VANCOMYCIN, RANDOM: Vancomycin Rm: 11 ug/mL

## 2023-04-18 LAB — PROTIME-INR
INR: 3.4 — ABNORMAL HIGH (ref 0.8–1.2)
Prothrombin Time: 34.6 s — ABNORMAL HIGH (ref 11.4–15.2)

## 2023-04-18 LAB — MAGNESIUM: Magnesium: 1.9 mg/dL (ref 1.7–2.4)

## 2023-04-18 LAB — PHOSPHORUS: Phosphorus: 2.9 mg/dL (ref 2.5–4.6)

## 2023-04-18 MED ORDER — BUPROPION HCL 100 MG PO TABS
100.0000 mg | ORAL_TABLET | Freq: Every day | ORAL | Status: AC
  Administered 2023-04-19 – 2023-04-21 (×3): 100 mg via ORAL
  Filled 2023-04-18 (×4): qty 1

## 2023-04-18 MED ORDER — BUPROPION HCL 100 MG PO TABS
50.0000 mg | ORAL_TABLET | Freq: Every day | ORAL | Status: DC
  Administered 2023-04-25 – 2023-04-26 (×2): 50 mg via ORAL
  Filled 2023-04-18 (×3): qty 0.5

## 2023-04-18 MED ORDER — PREDNISONE 20 MG PO TABS
20.0000 mg | ORAL_TABLET | Freq: Every day | ORAL | Status: AC
  Administered 2023-04-19 – 2023-04-20 (×2): 20 mg via ORAL
  Filled 2023-04-18 (×2): qty 1

## 2023-04-18 MED ORDER — POTASSIUM CHLORIDE CRYS ER 20 MEQ PO TBCR
40.0000 meq | EXTENDED_RELEASE_TABLET | Freq: Two times a day (BID) | ORAL | Status: AC
  Administered 2023-04-18 (×2): 40 meq via ORAL
  Filled 2023-04-18 (×2): qty 2

## 2023-04-18 MED ORDER — BUPROPION HCL 75 MG PO TABS
75.0000 mg | ORAL_TABLET | Freq: Every day | ORAL | Status: AC
  Administered 2023-04-22 – 2023-04-24 (×3): 75 mg via ORAL
  Filled 2023-04-18 (×3): qty 1

## 2023-04-18 MED ORDER — PREDNISONE 10 MG PO TABS
10.0000 mg | ORAL_TABLET | Freq: Every day | ORAL | Status: AC
  Administered 2023-04-21 – 2023-04-22 (×2): 10 mg via ORAL
  Filled 2023-04-18 (×2): qty 1

## 2023-04-18 MED ORDER — FE FUM-VIT C-VIT B12-FA 460-60-0.01-1 MG PO CAPS
1.0000 | ORAL_CAPSULE | Freq: Every day | ORAL | Status: DC
  Administered 2023-04-18 – 2023-04-27 (×9): 1 via ORAL
  Filled 2023-04-18 (×10): qty 1

## 2023-04-18 MED ORDER — VANCOMYCIN HCL 750 MG/150ML IV SOLN
750.0000 mg | Freq: Two times a day (BID) | INTRAVENOUS | Status: DC
  Administered 2023-04-18 – 2023-04-24 (×13): 750 mg via INTRAVENOUS
  Filled 2023-04-18 (×12): qty 150

## 2023-04-18 MED ORDER — WARFARIN SODIUM 5 MG PO TABS
5.0000 mg | ORAL_TABLET | Freq: Once | ORAL | Status: AC
  Administered 2023-04-18: 5 mg via ORAL
  Filled 2023-04-18: qty 1

## 2023-04-18 NOTE — Progress Notes (Signed)
PROGRESS NOTE    [name]  ZOX:[id number] DOB: 1956/01/12 DOA: 04/09/2023 PCP: [name] Housekeeper, MD  Chief Complaint  Patient presents with   Shortness of Breath    Hospital Course:  [name] is 68 y.o. male with stage IV prostate cancer, recurrent VTE, hypertension, PVD status post AKA, decubitus ulcers, who was admitted on 1/18 with shortness of breath and was found to have recurrent DVT despite Eliquis and then Coumadin (not technically a failure as he was not bridged.)  He had been unable to afford Lovenox as an outpatient.  During hospitalization he was noted to have volume overload and was diuresed.  Stay was also complicated by ileus for stool impaction that was treated aggressively with a bowel regimen.  He was discharged in stable condition on 1/31.  He returned to the ED on 2/4 due to increasing confusion and shortness of breath.  He was found to be saturating 89% on room air.  EMS placed patient on CPAP prior to arrival.  In the ED he was found to be septic with fever of 101.9, respiratory rate 31, leukocytosis 20K, and hypoxia.  Chest x-ray revealed left basilar opacity.  He was placed on BiPAP due to work of breathing and hypoxemia.  He failed weaning trial due to work of breathing though he did have good oxygen saturations.  He was admitted for management of healthcare associated pneumonia and sepsis. Hospital stay has been complex.  CT revealed multiple septic emboli in the lungs.  We attempted port removal early on as this was suspected nidus of infection, however his INR was supratherapeutic which prevented port removal.  He did receive low-dose vitamin K on 2/7 and ultimately was able to have port removed on 2/9.  The tip culture is expectedly positive.  He underwent TEE on 2/11.  He had further respiratory compromise throughout admission and was on prolonged BiPAP.  Further respiratory compromise appeared to be secondary to acute CHF exacerbation.  He has  been started on IV diuresis and has had profound 15 L output since arrival.  He is now stabilizing on 2 L nasal cannula, and will need skilled nursing placement.  2/12: Patient currently stable, does not want to go to SNF, asking to complete IV antibiotics and then go home, stating that he had everything in place at home to help.  TEE was negative, per ID will need 4 weeks of antibiotics.  2/13: Continue to improve.  Will complete IV vancomycin seen on 2/23 before leaving home with home health.  Patient will need 2 more weeks of linezolid afterwards.  ID is tapering home Wellbutrin so he can get linezolid.  Subjective: Patient was sitting in chair when seen today.  Continue to feel improved.  Objective: Vitals:   04/18/23 0357 04/18/23 0907 04/18/23 1328 04/18/23 1553  BP: 131/75 (!) 119/53 109/74 120/71  Pulse: (!) 48 69 71 62  Resp: 18     Temp: 97.7 F (36.5 C) 97.6 F (36.4 C) 98 F (36.7 C) 97.9 F (36.6 C)  TempSrc:      SpO2: 100% 98% 99% 97%  Weight:      Height:        Intake/Output Summary (Last 24 hours) at 04/18/2023 1620 Last data filed at 04/18/2023 1443 Gross per 24 hour  Intake 720 ml  Output 1600 ml  Net -880 ml   Filed Weights   04/09/23 2019  Weight: 136 kg    Examination: General.  Obese gentleman,  in no acute distress. Pulmonary.  Lungs clear bilaterally, normal respiratory effort. CV.  Regular rate and rhythm, no JVD, rub or murmur. Abdomen.  Soft, nontender, nondistended, BS positive. CNS.  Alert and oriented .  No focal neurologic deficit. Extremities.  Bilateral AKA Psychiatry.  Judgment and insight appears normal.   Assessment & Plan:  Principal Problem:   HAP (hospital-acquired pneumonia) Active Problems:   Severe sepsis (HCC)   PE (pulmonary thromboembolism) (HCC)   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   Ogilvie syndrome   AKI (acute kidney injury) (HCC)   Prostate cancer metastatic to bone [facility])   Essential  hypertension   MRSA bacteremia   Pulmonary infiltrates   Septic embolism (HCC)   Palliative care encounter   Acute respiratory failure with hypoxia (HCC)   Morbid obesity (HCC)    Severe sepsis - Criteria met with hypoxemia, fever, AKI, leukocytosis, tachypnea.  Source: Pneumonia and bacteremia - Antibiotics as below  MRSA bacteremia - Patient is on vancomycin - ID consulted - On arrival port in place and there are multiple nodules in the lungs concerning for septic emboli - TEE without evidence of endocarditis  - Port removed 2/9.  Tip culture positive for MRSA -Patient will get IV vancomycin until 2/23, followed by 2 weeks of p.o. linezolid. -Patient wants to remain in hospital until IV is done  Healthcare associated pneumonia +/- septic emboli Hypoxic respiratory failure - Respiratory status much improved now.  Has required BiPAP multiple times throughout this admission.  Has now been successfully weaned to room air Respiratory failure secondary to pneumonia, septic emboli, and then pulmonary edema -- Started on BID Lasix 2/8 and has significant improvement. Tapered to home dose now. - Has been on prednisone, begin tapering 2/10. - Procalcitonin: 5.8 - Leukocytosis -now resolved  AKI - Baseline creatinine 0.98, creatinine 3.89 on arrival - Lasix was added and creatinine has improved significantly.  Is now back to baseline -- ID to consider linezolid if needed, but currently patient appears to be tolerating vancomycin. - Status post IVF. -- Strict Ins and Outs.   Pulmonary embolism Supratherapeutic INR - Has had recurrent VTEs on Eliquis, now on warfarin therapy --  Received low-dose vitamin K  2/7 for INR 4.5, needed port removal. Avoid high-dose vitamin K as he is very high risk for coagulopathy given his sepsis, malignancy, and history of recurrent VTE -- INR now <2, pharm consult to bridge with lovenox -- Pharmacy to manage Warfarin dosing  HF preserved EF, with  acute exacerbation - On arrival BNP 110, and patient did not appear acutely volume overloaded.  He was septic and received some IV fluids.  Suspect he became overloaded during that time. - He has now been started on IV diuresis and has put out Over 15L since admission. -- Diuresis slowly now, will back off on lasix today. Resume home dose 20mg  - ECHO: EF 55-60%, no RWMA, mildly dilated LV, diastolic parameters indeterminate  Hypokalemia - Secondary to IV Lasix - Continue to replace PRN  Prostate cancer metastatic to bone - Follows with oncology outpatient, currently holding treatment in setting of sepsis - Port removal as above - Palliative consult - Patient was on hospice outpatient but he misunderstood the services.  He is not interested in hospice at this time  Hypertension - Continue home meds  Back and flank pain - Suspect this is secondary to his multiple osseous lesions.  Continue with as needed Dilaudid and Percocet.  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 PT/Ot consulted, improving now.  DVT prophylaxis: Warfarin with lovenox bridge   Code Status: Limited: Do not attempt resuscitation (DNR) -DNR-LIMITED -Do Not Intubate/DNI  Family  Communication: discussed directly with patient Disposition:  Status is: Inpatient, workup ongoing.  Currently necessitates IV antibiotics.  Patient is nearing his baseline now.  Will need to discharge directly to rehab, and then from there home with home health. TOC consulted for rehab planning.  Patient currently does not want to go to rehab    Consultants:  ID Cardiology (TEE) IR (port removal)  Procedures:    Antimicrobials:  Anti-infectives (From admission, onward)    Start     Dose/Rate Route Frequency Ordered Stop   04/18/23 0900  vancomycin (VANCOREADY) IVPB 750 mg/150 mL        750 mg 75 mL/hr over 120 Minutes Intravenous Every 12 hours 04/18/23 0818     04/15/23 0800  vancomycin (VANCOREADY) IVPB 1500 mg/300 mL  Status:  Discontinued        1,500 mg 100 mL/hr over 180 Minutes Intravenous Every 24 hours 04/15/23 0649 04/18/23 0818   04/13/23 1030  vancomycin variable dose per unstable renal function (pharmacist dosing)  Status:  Discontinued         Does not apply See admin instructions 04/13/23 1031 04/15/23 0711   04/12/23 1000  vancomycin (VANCOREADY) IVPB 1500 mg/300 mL  Status:  Discontinued        1,500 mg 100 mL/hr over 180 Minutes Intravenous Every 24 hours 04/12/23 0831 04/13/23 1031   04/11/23 1000  vancomycin (VANCOREADY) IVPB 1500 mg/300 mL        1,500 mg 75 mL/hr over 240 Minutes Intravenous  Once 04/11/23 0828 04/11/23 1358   04/10/23 0308  vancomycin variable dose per unstable renal function (pharmacist dosing)  Status:  Discontinued         Does not apply See admin instructions 04/10/23 0308 04/12/23 0831   04/10/23 0145  vancomycin (VANCOREADY) IVPB 1500 mg/300 mL        1,500 mg 100 mL/hr over 180 Minutes Intravenous  Once 04/10/23 0131 04/10/23 0546   04/10/23 0130  vancomycin (VANCOCIN) IVPB 1000 mg/200 mL premix  Status:  Discontinued        1,000 mg 200 mL/hr over 60 Minutes Intravenous  Once 04/10/23 0120 04/10/23 0131   04/09/23 2100  vancomycin  (VANCOCIN) IVPB 1000 mg/200 mL premix        1,000 mg 200 mL/hr over 60 Minutes Intravenous  Once 04/09/23 2047 04/10/23 0113   04/09/23 2100  ceFEPIme (MAXIPIME) 2 g in sodium chloride 0.9 % 100 mL IVPB        2 g 200 mL/hr over 30 Minutes Intravenous  Once 04/09/23 2047 04/10/23 0113       Data Reviewed: I have personally  reviewed following labs and imaging studies CBC: Recent Labs  Lab 04/15/23 0548 04/16/23 0211 04/16/23 0603 04/17/23 0422 04/18/23 0642  WBC 10.6* 10.4 9.8 9.0 9.0  NEUTROABS 8.9* 9.1* 8.3* 7.0 7.0  HGB 10.5* 11.4* 11.5* 10.8* 10.1*  HCT 30.9* 34.7* 34.2* 32.1* 31.4*  MCV 90.1 93.5 91.0 91.7 94.3  PLT 376 403* 403* 356 319   Basic Metabolic Panel: Recent Labs  Lab 04/15/23 0548 04/15/23 1528 04/16/23 0211 04/16/23 0603 04/17/23 0422 04/18/23 0642  NA 138  --  137 138 138 140  K 2.7* 2.8* 3.7 3.6 2.7* 3.1*  CL 104  --  102 103 106 107  CO2 24  --  25 25 24 26   GLUCOSE 116*  --  156* 126* 95 91  BUN 47*  --  38* 38* 31* 21  CREATININE 0.95  --  1.00 0.92 0.78 0.72  CALCIUM 9.9  --  9.4 9.6 9.2 9.3  MG 1.9  --  1.7 1.8 1.6* 1.9  PHOS 2.7  --  3.2 3.3 2.6 2.9   GFR: Estimated Creatinine Clearance: 103.4 mL/min (by C-G formula based on SCr of 0.72 mg/dL). Liver Function Tests: Recent Labs  Lab 04/15/23 0548 04/16/23 0211 04/16/23 0603 04/17/23 0422 04/18/23 0642  AST 33 36 35 30 24  ALT 31 36 36 35 30  ALKPHOS 60 65 65 59 58  BILITOT 0.5 0.6 0.5 0.7 0.6  PROT 6.4* 6.7 6.8 5.9* 5.8*  ALBUMIN 2.8* 2.8* 2.8* 2.7* 2.6*   CBG: No results for input(s): "GLUCAP" in the last 168 hours.   Recent Results (from the past 240 hours)  Resp panel by RT-PCR (RSV, Flu A&B, Covid) Anterior Nasal Swab     Status: None   Collection Time: 04/09/23  8:19 PM   Specimen: Anterior Nasal Swab  Result Value Ref Range Status   SARS Coronavirus 2 by RT PCR NEGATIVE NEGATIVE Final    Comment: (NOTE) SARS-CoV-2 target nucleic acids are NOT DETECTED.  The  SARS-CoV-2 RNA is generally detectable in upper respiratory specimens during the acute phase of infection. The lowest concentration of SARS-CoV-2 viral copies this assay can detect is 138 copies/mL. A negative result does not preclude SARS-Cov-2 infection and should not be used as the sole basis for treatment or other patient management decisions. A negative result may occur with  improper specimen collection/handling, submission of specimen other than nasopharyngeal swab, presence of viral mutation(s) within the areas targeted by this assay, and inadequate number of viral copies(<138 copies/mL). A negative result must be combined with clinical observations, patient history, and epidemiological information. The expected result is Negative.  Fact Sheet for Patients:  BloggerCourse.com  Fact Sheet for Healthcare Providers:  SeriousBroker.it  This test is no t yet approved or cleared by the [place] FDA and  has been authorized for detection and/or diagnosis of SARS-CoV-2 by FDA under an Emergency Use Authorization (EUA). This EUA will remain  in effect (meaning this test can be used) for the duration of the COVID-19 declaration under Section 564(b)(1) of the Act, 21 U.S.C.section 360bbb-3(b)(1), unless the authorization is terminated  or revoked sooner.       Influenza A by PCR NEGATIVE NEGATIVE Final   Influenza B by PCR NEGATIVE NEGATIVE Final    Comment: (NOTE) The Xpert Xpress SARS-CoV-2/FLU/RSV plus assay is intended as an aid in the diagnosis of influenza from Nasopharyngeal swab specimens and should not be used as a sole basis for treatment. Nasal washings and aspirates  are unacceptable for Xpert Xpress SARS-CoV-2/FLU/RSV testing.  Fact Sheet for Patients: BloggerCourse.com  Fact Sheet for Healthcare Providers: SeriousBroker.it  This test is not yet approved or  cleared by the [place] FDA and has been authorized for detection and/or diagnosis of SARS-CoV-2 by FDA under an Emergency Use Authorization (EUA). This EUA will remain in effect (meaning this test can be used) for the duration of the COVID-19 declaration under Section 564(b)(1) of the Act, 21 U.S.C. section 360bbb-3(b)(1), unless the authorization is terminated or revoked.     Resp Syncytial Virus by PCR NEGATIVE NEGATIVE Final    Comment: (NOTE) Fact Sheet for Patients: BloggerCourse.com  Fact Sheet for Healthcare Providers: SeriousBroker.it  This test is not yet approved or cleared by the [place] FDA and has been authorized for detection and/or diagnosis of SARS-CoV-2 by FDA under an Emergency Use Authorization (EUA). This EUA will remain in effect (meaning this test can be used) for the duration of the COVID-19 declaration under Section 564(b)(1) of the Act, 21 U.S.C. section 360bbb-3(b)(1), unless the authorization is terminated or revoked.  Performed at [facility], [address] [place], [place] [address]   Blood Culture (routine x 2)     Status: Abnormal (Preliminary result)   Collection Time: 04/09/23  8:19 PM   Specimen: Right Antecubital; Blood  Result Value Ref Range Status   Specimen Description   Final    RIGHT ANTECUBITAL BLOOD Performed at [facility], [address], [place], [place] [address]    Special Requests   Final    BOTTLES DRAWN AEROBIC AND ANAEROBIC Blood Culture results may not be optimal due to an inadequate volume of blood received in culture bottles Performed at [facility], [address] [place], [place] [address]    Culture  Setup Time   Final    GRAM POSITIVE COCCI IN BOTH AEROBIC AND ANAEROBIC BOTTLES CRITICAL RESULT CALLED TO, READ BACK BY AND VERIFIED WITH: TREY GREENWOOD PHARMD 1137 04/10/23 HNM GRAM STAIN REVIEWED-AGREE WITH RESULT DRT     Culture (A)  Final    METHICILLIN RESISTANT STAPHYLOCOCCUS AUREUS Sent to [facility] for further susceptibility testing. Performed at [facility], [address] [place], [place] [address]    Report Status PENDING  Incomplete   Organism ID, Bacteria METHICILLIN RESISTANT STAPHYLOCOCCUS AUREUS  Final      Susceptibility   Methicillin resistant staphylococcus aureus - MIC*    CIPROFLOXACIN >=8 RESISTANT Resistant     ERYTHROMYCIN >=8 RESISTANT Resistant     GENTAMICIN <=0.5 SENSITIVE Sensitive     OXACILLIN >=4 RESISTANT Resistant     TETRACYCLINE <=1 SENSITIVE Sensitive     VANCOMYCIN 1 SENSITIVE Sensitive     TRIMETH/SULFA <=10 SENSITIVE Sensitive     CLINDAMYCIN <=0.25 SENSITIVE Sensitive     RIFAMPIN <=0.5 SENSITIVE Sensitive     Inducible Clindamycin NEGATIVE Sensitive     LINEZOLID 2 SENSITIVE Sensitive     * METHICILLIN RESISTANT STAPHYLOCOCCUS AUREUS  Blood Culture (routine x 2)     Status: Abnormal   Collection Time: 04/09/23  8:19 PM   Specimen: BLOOD RIGHT FOREARM  Result Value Ref Range Status   Specimen Description   Final    BLOOD RIGHT FOREARM Performed at [facility], [address] [place], [place] [address]    Special Requests   Final    BOTTLES DRAWN AEROBIC AND ANAEROBIC Blood Culture results may not be optimal due to an inadequate volume of  blood received in culture bottles Performed at [facility], [address] [place], [place] [address]    Culture  Setup Time   Final    GRAM POSITIVE COCCI AEROBIC BOTTLE ONLY CRITICAL VALUE NOTED.  VALUE IS CONSISTENT WITH PREVIOUSLY REPORTED AND CALLED VALUE. GRAM STAIN REVIEWED-AGREE WITH RESULT DRT    Culture (A)  Final    STAPHYLOCOCCUS AUREUS SUSCEPTIBILITIES PERFORMED ON PREVIOUS CULTURE WITHIN THE LAST 5 DAYS. Performed at [facility], [address] [place], [place] [address]    Report Status 04/12/2023 FINAL  Final  Blood Culture ID Panel (Reflexed)     Status: Abnormal    Collection Time: 04/09/23  8:19 PM  Result Value Ref Range Status   Enterococcus faecalis NOT DETECTED NOT DETECTED Final   Enterococcus Faecium NOT DETECTED NOT DETECTED Final   Listeria monocytogenes NOT DETECTED NOT DETECTED Final   Staphylococcus species DETECTED (A) NOT DETECTED Final    Comment: CRITICAL RESULT CALLED TO, READ BACK BY AND VERIFIED WITH: Gloriann Loan PHARMD 1137 04/10/23 HNM    Staphylococcus aureus (BCID) DETECTED (A) NOT DETECTED Final    Comment: Methicillin (oxacillin)-resistant Staphylococcus aureus (MRSA). MRSA is predictably resistant to beta-lactam antibiotics (except ceftaroline). Preferred therapy is vancomycin unless clinically contraindicated. Patient requires contact precautions if  hospitalized. CRITICAL RESULT CALLED TO, READ BACK BY AND VERIFIED WITH: Gloriann Loan PHARMD 1137 04/10/23 HNM    Staphylococcus epidermidis NOT DETECTED NOT DETECTED Final   Staphylococcus lugdunensis NOT DETECTED NOT DETECTED Final   Streptococcus species NOT DETECTED NOT DETECTED Final   Streptococcus agalactiae NOT DETECTED NOT DETECTED Final   Streptococcus pneumoniae NOT DETECTED NOT DETECTED Final   Streptococcus pyogenes NOT DETECTED NOT DETECTED Final   A.calcoaceticus-baumannii NOT DETECTED NOT DETECTED Final   Bacteroides fragilis NOT DETECTED NOT DETECTED Final   Enterobacterales NOT DETECTED NOT DETECTED Final   Enterobacter cloacae complex NOT DETECTED NOT DETECTED Final   Escherichia coli NOT DETECTED NOT DETECTED Final   Klebsiella aerogenes NOT DETECTED NOT DETECTED Final   Klebsiella oxytoca NOT DETECTED NOT DETECTED Final   Klebsiella pneumoniae NOT DETECTED NOT DETECTED Final   Proteus species NOT DETECTED NOT DETECTED Final   Salmonella species NOT DETECTED NOT DETECTED Final   Serratia marcescens NOT DETECTED NOT DETECTED Final   Haemophilus influenzae NOT DETECTED NOT DETECTED Final   Neisseria meningitidis NOT DETECTED NOT DETECTED Final    Pseudomonas aeruginosa NOT DETECTED NOT DETECTED Final   Stenotrophomonas maltophilia NOT DETECTED NOT DETECTED Final   Candida albicans NOT DETECTED NOT DETECTED Final   Candida auris NOT DETECTED NOT DETECTED Final   Candida glabrata NOT DETECTED NOT DETECTED Final   Candida krusei NOT DETECTED NOT DETECTED Final   Candida parapsilosis NOT DETECTED NOT DETECTED Final   Candida tropicalis NOT DETECTED NOT DETECTED Final   Cryptococcus neoformans/gattii NOT DETECTED NOT DETECTED Final   Meth resistant mecA/C and MREJ DETECTED (A) NOT DETECTED Final    Comment: CRITICAL RESULT CALLED TO, READ BACK BY AND VERIFIED WITHGloriann Loan [facility] 1137 04/10/23 HNM Performed at [facility], [address] [place], [place] [address]   Respiratory (~20 pathogens) panel by PCR     Status: None   Collection Time: 04/10/23  1:27 AM   Specimen: Nasopharyngeal Swab; Respiratory  Result Value Ref Range Status   Adenovirus NOT DETECTED NOT DETECTED Final   Coronavirus 229E NOT DETECTED NOT DETECTED Final    Comment: (NOTE) The Coronavirus on the Respiratory Panel,  DOES NOT test for the novel  Coronavirus (2019 nCoV)    Coronavirus HKU1 NOT DETECTED NOT DETECTED Final   Coronavirus NL63 NOT DETECTED NOT DETECTED Final   Coronavirus OC43 NOT DETECTED NOT DETECTED Final   Metapneumovirus NOT DETECTED NOT DETECTED Final   Rhinovirus / Enterovirus NOT DETECTED NOT DETECTED Final   Influenza A NOT DETECTED NOT DETECTED Final   Influenza B NOT DETECTED NOT DETECTED Final   Parainfluenza Virus 1 NOT DETECTED NOT DETECTED Final   Parainfluenza Virus 2 NOT DETECTED NOT DETECTED Final   Parainfluenza Virus 3 NOT DETECTED NOT DETECTED Final   Parainfluenza Virus 4 NOT DETECTED NOT DETECTED Final   Respiratory Syncytial Virus NOT DETECTED NOT DETECTED Final   Bordetella pertussis NOT DETECTED NOT DETECTED Final   Bordetella Parapertussis NOT DETECTED NOT DETECTED Final   Chlamydophila pneumoniae  NOT DETECTED NOT DETECTED Final   Mycoplasma pneumoniae NOT DETECTED NOT DETECTED Final    Comment: Performed at [facility], [address] [place], [place] [address]  Urine Culture     Status: None   Collection Time: 04/10/23  1:27 AM   Specimen: Urine, Random  Result Value Ref Range Status   Specimen Description   Final    URINE, RANDOM Performed at [facility], [address] [place], [place] [address]    Special Requests   Final    NONE Reflexed from [id number] Performed at [facility], [address] [place], [place] [address]    Culture   Final    NO GROWTH Performed at [facility], [address] [place], [place] [address]    Report Status 04/11/2023 FINAL  Final  C Difficile Quick Screen w PCR reflex     Status: None   Collection Time: 04/10/23  2:00 PM   Specimen: STOOL  Result Value Ref Range Status   C Diff antigen NEGATIVE NEGATIVE Final   C Diff toxin NEGATIVE NEGATIVE Final   C Diff interpretation No C. difficile detected.  Final    Comment: Performed at [facility], [address] [place], [place] [address]  MRSA Next Gen by PCR, Nasal     Status: Abnormal   Collection Time: 04/10/23  7:18 PM   Specimen: Nasal Mucosa; Nasal Swab  Result Value Ref Range Status   MRSA by PCR Next Gen DETECTED (A) NOT DETECTED Final    Comment: RESULT CALLED TO, READ BACK BY AND VERIFIED WITH:  JACOB MOORE AT 2346 04/10/23 JG (NOTE) The GeneXpert MRSA Assay (FDA approved for NASAL specimens only), is one component of a comprehensive MRSA colonization surveillance program. It is not intended to diagnose MRSA infection nor to guide or monitor treatment for MRSA infections. Test performance is not FDA approved in patients less than 41 years old. Performed at [facility], [address] [place], [place] [address]   Culture, blood (Routine X 2) w Reflex to ID Panel     Status: None   Collection Time: 04/12/23  5:00 AM    Specimen: BLOOD  Result Value Ref Range Status   Specimen Description BLOOD UNKNOWN  Final   Special Requests   Final    BOTTLES DRAWN AEROBIC AND ANAEROBIC Blood Culture adequate volume   Culture   Final    NO GROWTH 5 DAYS Performed at [facility], [address] [place], [place] [address]    Report Status 04/17/2023 FINAL  Final  MIC (1 Drug)-blood culture; 04/09/2023; BLOOD  RIGHT ARM; MRSA; Daptomycin     Status: Abnormal   Collection Time: 04/12/23  9:40 AM   Specimen: BLOOD RIGHT ARM  Result Value Ref Range Status   Min Inhibitory Conc (1 Drug) Preliminary report (A)  Final    Comment: (NOTE) Performed At: [facility] [address] [place], [place] [id number] Jolene Schimke MD WU:[id number]    Source [phone]  Final    Comment: Performed at [facility], [address], [place], [place] [address]  Culture, blood (Routine X 2) w Reflex to ID Panel     Status: None   Collection Time: 04/12/23  1:35 PM   Specimen: BLOOD  Result Value Ref Range Status   Specimen Description BLOOD BLOOD LEFT FOREARM  Final   Special Requests   Final    BOTTLES DRAWN AEROBIC AND ANAEROBIC Blood Culture adequate volume   Culture   Final    NO GROWTH 5 DAYS Performed at [facility], [address] [place], [place] [address]    Report Status 04/17/2023 FINAL  Final  Cath Tip Culture     Status: Abnormal   Collection Time: 04/14/23 12:00 PM   Specimen: Catheter Tip; Other  Result Value Ref Range Status   Specimen Description   Final    CATH TIP Performed at [facility], [address] [place], [place] [address]    Special Requests   Final    NONE Performed at [facility], [address] [place], [place] [address]    Culture (A)  Final    >=100,000 COLONIES/mL METHICILLIN RESISTANT STAPHYLOCOCCUS AUREUS   Report Status 04/17/2023 FINAL  Final   Organism ID, Bacteria METHICILLIN RESISTANT STAPHYLOCOCCUS AUREUS (A)  Final       Susceptibility   Methicillin resistant staphylococcus aureus - MIC*    CIPROFLOXACIN >=8 RESISTANT Resistant     ERYTHROMYCIN >=8 RESISTANT Resistant     GENTAMICIN <=0.5 SENSITIVE Sensitive     OXACILLIN >=4 RESISTANT Resistant     TETRACYCLINE <=1 SENSITIVE Sensitive     VANCOMYCIN 1 SENSITIVE Sensitive     TRIMETH/SULFA <=10 SENSITIVE Sensitive     CLINDAMYCIN <=0.25 SENSITIVE Sensitive     RIFAMPIN <=0.5 SENSITIVE Sensitive     Inducible Clindamycin NEGATIVE Sensitive     LINEZOLID 2 SENSITIVE Sensitive     * >=100,000 COLONIES/mL METHICILLIN RESISTANT STAPHYLOCOCCUS AUREUS  Culture, blood (Routine X 2) w Reflex to ID Panel     Status: None (Preliminary result)   Collection Time: 04/15/23 10:05 PM   Specimen: BLOOD  Result Value Ref Range Status   Specimen Description BLOOD BLOOD RIGHT ARM  Final   Special Requests   Final    BOTTLES DRAWN AEROBIC AND ANAEROBIC Blood Culture adequate volume   Culture   Final    NO GROWTH 3 DAYS Performed at [facility], [address], [place], [place] [address]    Report Status PENDING  Incomplete  Culture, blood (Routine X 2) w Reflex to ID Panel     Status: None (Preliminary result)   Collection Time: 04/15/23 10:05 PM   Specimen: BLOOD  Result Value Ref Range Status   Specimen Description BLOOD BLOOD LEFT ARM  Final   Special Requests   Final    BOTTLES DRAWN AEROBIC AND ANAEROBIC Blood Culture adequate volume   Culture   Final    NO GROWTH 3 DAYS Performed at [facility], [address] [place], [place] [address]  Report Status PENDING  Incomplete     Radiology Studies: No results found.   Scheduled Meds:  acetaminophen  650 mg Oral Q6H   [START ON 04/19/2023] buPROPion  100 mg Oral Daily   Followed by   Melene Muller ON 04/22/2023] buPROPion  75 mg Oral Daily   Followed by   Melene Muller ON 04/25/2023] buPROPion  50 mg Oral Daily   Chlorhexidine Gluconate Cloth  6 each Topical Daily   dicyclomine  10 mg Oral TID  AC & HS   Fe Fum-Vit C-Vit B12-FA  1 capsule Oral QPC breakfast   furosemide  20 mg Oral Daily   gabapentin  200 mg Oral TID   lactulose  10 g Oral TID   leptospermum manuka honey  1 Application Topical Daily   lidocaine  1 patch Transdermal Q24H   nystatin cream   Topical BID   polyethylene glycol  17 g Oral BID   potassium chloride  40 mEq Oral BID   [START ON 04/19/2023] predniSONE  20 mg Oral Q breakfast   Followed by   Melene Muller ON 04/21/2023] predniSONE  10 mg Oral Q breakfast   warfarin  5 mg Oral ONCE-1600   Warfarin - Pharmacist Dosing Inpatient   Does not apply q1600   Continuous Infusions:  vancomycin 750 mg (04/18/23 1001)       LOS: 8 days    Time spent:  50 min  Arnetha Courser, MD [facility]  To contact the attending physician between 7A-7P please use Epic Chat. To contact the covering physician during after hours 7P-7A, please review Amion.   04/18/2023, 4:20 PM   *This document has been created with the assistance of dictation software. Please excuse typographical errors. *

## 2023-04-18 NOTE — Progress Notes (Signed)
Mobility Specialist - Progress Note   04/18/23 1405  Mobility  Activity Transferred from chair to bed  Level of Assistance Standby assist, set-up cues, supervision of patient - no hands on  Assistive Device None  Range of Motion/Exercises Active;Right arm;Left arm  Activity Response Tolerated well  Mobility visit 1 Mobility  Mobility Specialist Start Time (ACUTE ONLY) 1340  Mobility Specialist Stop Time (ACUTE ONLY) 1400  Mobility Specialist Time Calculation (min) (ACUTE ONLY) 20 min   Pt sitting in the recliner upon entry, utilizing RA. Pt ready to transfer to bed. Pt transferred from chair to bed via lateral scoot with SBA--- max effort from Pt. Pt very cautious during transfer d/t wound on bottom, utilizing chair and bed railing for support. Pt left simi fowler with alarm set and needs within reach. RN notified.  Zetta Bills Mobility Specialist 04/18/23 2:13 PM

## 2023-04-18 NOTE — Progress Notes (Signed)
Occupational Therapy Treatment Patient Details Name: [name] MRN: [id number] DOB: 27-Jun-1955 Today's Date: 04/18/2023   History of present illness [name] is a 67yoM who comes to [facility] on 04/09/23 c confusion, SOB. Was here in Jan with recurrent DVT. Pt admitted with sepsis 2/4 due to PNA. 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 Pt previously on hospice at home, but no longer interested in this. Anticoaglulation being held as of 2/8 for pending port removal and TEE.   OT comments  Pt is supine in bed on arrival. Pleasant and agreeable to PT/OT co-tx session to maximize safety and facilitate transfer to recliner. He reports pain in his buttocks with blood noted on chux pad and nurse notified, as well as his L groin region. Edu on pressure relief techniques to promote skin integrity. Bed mobility performed with SUP using bed rails and HOB elevated fully. Lateral scoot to recliner with SBA x2 and increased time.  Pt performed chair push-ups for 10 reps with no issues. Set up assist for grooming tasks in recliner. OT to drop off blue theraband for him to continue BUE HEP. Nurse aware of status in recliner and pt with all needs in place and will cont to require skilled acute OT services to maximize his safety and IND to return to PLOF.       If plan is discharge home, recommend the following:  Assistance with cooking/housework;Help with stairs or ramp for entrance;Assist for transportation;A little help with walking and/or transfers;A little help with bathing/dressing/bathroom   Equipment Recommendations  None recommended by OT    Recommendations for Other Services      Precautions / Restrictions Precautions Precaution/Restrictions Comments: B AKA Restrictions Weight Bearing Restrictions Per Provider Order: No Other Position/Activity Restrictions: B AKA       Mobility Bed Mobility Overal bed  mobility: Needs Assistance Bed Mobility: Supine to Sit     Supine to sit: Supervision, HOB elevated, Used rails     General bed mobility comments: SUP to reach EOB with HOB elevated fully and use of rails    Transfers     Transfers: Bed to chair/wheelchair/BSC            Lateral/Scoot Transfers: Supervision, Contact guard assist General transfer comment: CGA/SBA for lateral scoot to recliner     Balance Overall balance assessment: Needs assistance Sitting-balance support: Bilateral upper extremity supported Sitting balance-Leahy Scale: Good                                     ADL either performed or assessed with clinical judgement   ADL       Grooming: Wash/dry face;Oral care;Sitting;Set up                     Toilet Transfer Details (indicate cue type and reason): simulated lateral scoot to recliner with CGA/SBA                Extremity/Trunk Assessment              Vision       Perception     Praxis     Communication Communication Communication: No apparent difficulties   Cognition Arousal: Alert Behavior During Therapy: [facility] for tasks assessed/performed  Cueing   Cueing Techniques: Verbal cues  Exercises Other Exercises Other Exercises: Pt performed chair push-ups x10 reps in recliner with no issues. Other Exercises: Pt wanting more theraband, OT to bring back blue theraband and leave with pt after lunch to continue his BUE HEP. Other Exercises: Pt reports working with MDs regarding purchasing a different hospital bed that he is more similiar to current one at [facility] to mobilize on his own at home.    Shoulder Instructions       General Comments blood on chux pad-nurse notified    Pertinent Vitals/ Pain       Pain Assessment Pain Assessment: Faces Faces Pain Scale: Hurts even more Pain Location: buttocks Pain Descriptors / Indicators: Tender,  Sore Pain Intervention(s): Monitored during session, Repositioned  Home Living                                          Prior Functioning/Environment              Frequency  Min 1X/week        Progress Toward Goals  OT Goals(current goals can now be found in the care plan section)  Progress towards OT goals: Progressing toward goals  Acute Rehab OT Goals Patient Stated Goal: return home OT Goal Formulation: With patient Time For Goal Achievement: 04/29/23 Potential to Achieve Goals: Good  Plan      Co-evaluation    PT/OT/SLP Co-Evaluation/Treatment: Yes Reason for Co-Treatment: For patient/therapist safety PT goals addressed during session: Mobility/safety with mobility OT goals addressed during session: ADL's and self-care      AM-PAC OT "6 Clicks" Daily Activity     Outcome Measure   Help from another person eating meals?: None Help from another person taking care of personal grooming?: None Help from another person toileting, which includes using toliet, bedpan, or urinal?: A Little Help from another person bathing (including washing, rinsing, drying)?: A Little Help from another person to put on and taking off regular upper body clothing?: None Help from another person to put on and taking off regular lower body clothing?: A Little 6 Click Score: 21    End of Session    OT Visit Diagnosis: Other abnormalities of gait and mobility (R26.89);Muscle weakness (generalized) (M62.81)   Activity Tolerance Patient tolerated treatment well   Patient Left in chair;with chair alarm set;with call bell/phone within reach   Nurse Communication Mobility status        Time: 1040-1108 OT Time Calculation (min): 28 min  Charges: OT General Charges $OT Visit: 1 Visit OT Treatments $Self Care/Home Management : 8-22 mins  Jasmon Mattice, OTR/L  04/18/23, 1:19 PM   [name] 04/18/2023, 1:15 PM

## 2023-04-18 NOTE — Progress Notes (Signed)
Pharmacy Antibiotic Note  [name] is a 68 y.o. male admitted on 04/09/2023 with MRSA bacteremia.  Pharmacy has been consulted for Vancomycin dosing.  Patient with recent admission for recurrent DVT, discharged 1/31.  Comes to ED 2/4 with confusion and shortness of breath.  CT chest/abdomen/pelvis lung findings concerning for septic pulmonary emboli.  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 ID consulted.   Today, 04/18/2023 Day #9 vancomycin Renal: AKI - resolved, SCr 0.72 mg/dl [id number] WBC WNL 2/4 blood culture: 3/4 bottles MRSA 2/5 Urine cx: NG Respiratory panel and respiratory virus panel neg 2/5 C difficile neg Rule out endocarditis with concern for septic pulmonary embolism on CT 2/5 ECHO: no endocarditis 2/7 repeat blood cultures: NG<24 hrs 2/9 Port removed - tip culture growing MRSA 2/10 repeat blood cultures: NGTD 2/11 TEE no endocarditis  Vancomycin dosing/levels: Vancomycin doses given 2/4 1gm IV x 1 at 2356 then 2/5 1500mg  IV  x1 2/6 random vancomycin level at 0603 = 11 mcg/mL 2/6 vancomycin 1500mg  IV x 1 given at 0947, 2/7 random vancomycin level 0553 = 16 mcg/mL 2/7 Vancomycin 1500 mg given 2/7@1046  2/8 vancomycin 1500 mg given 2/8 @0836  2/9 random vancomycin level @0502  = 23 mcg/ml,  no dose ordered 2/10 random vancomycin 12. Last dose 2/8 @0836  (1500mg  ) 2/11 random vancomycin  = 11 MCG/Ml AT 06:03, previous dose vanco 1500mg  given 2/10 at 11:00 Vancomycin dose 1500mg  IV q24h, dose given at 2/12 at 10:04 over 3hr infusion (level with 3rd dose of 1500mg  q24h) Vancomycin peak = 32 mcg/mL 2/12 at 14:20 Vancomycin randome = 11 mcg/ml 2/13 at 06:42 AUC = 464, Cmax 34.8 mcg/ml and Cmin 8.8 mcg/mL  Plan: Based on 2/12 and 2/13 vancomycin levels, adjust vancomycin to 750mg  IV q12h. Infuse each dose over 2h  AUC =  463, Cmax = 26.1 mcg/mL and Cmin 13.6 mcg/mL  Note: history of infusion related reaction with vancomycin - slow infusion Anticipate  some difficulty dosing vancomycin with PMH Bilateral AKA - effect on volume of distribution and SCr (loss of muscle mass).   Follow renal function closely daily CMP ordered until 2/15 Plan to check vancomycin levels once at steady state on adjusted dose ID planning vancomycin until 04/28/23 then complete two more weeks of antibiotic therapy with linezolid 600mg  PO BID (estimated copay $68)  Daptomycin- not currently option with septic PE, Pneumonia  Temp (24hrs), Avg:97.7 F (36.5 C), Min:97.7 F (36.5 C), Max:97.8 F (36.6 C)   Recent Labs  Lab 04/15/23 0548 04/16/23 0211 04/16/23 0603 04/17/23 0422 04/17/23 1420 04/18/23 0642  WBC 10.6* 10.4 9.8 9.0  --  9.0  CREATININE 0.95 1.00 0.92 0.78  --  0.72  VANCOPEAK  --   --   --   --  32  --   VANCORANDOM 12  --  11  --   --  11    Estimated Creatinine Clearance: 103.4 mL/min (by C-G formula based on SCr of 0.72 mg/dL).    Allergies  Allergen Reactions   Bacitracin Hives   Vancomycin Other (See Comments) and Rash    Red man syndrome.   Ketorolac     Other Reaction(s): Kidney Disorder  Other reaction(s): Kidney Disorder   Spironolactone     Other Reaction(s): Unknown  Other reaction(s): Unknown    Antimicrobials this admission: 2/4 Cefepime >> x 1 dose 2/4  Vancomycin >>    Thank you for allowing pharmacy to be a part of this patient's care.  Juliette Alcide, PharmD, BCPS, BCIDP Work Cell: [phone] 04/18/2023 8:06 AM

## 2023-04-18 NOTE — Progress Notes (Signed)
Physical Therapy Treatment Patient Details Name: [name] MRN: [id number] DOB: 10/16/1955 Today's Date: 04/18/2023   History of Present Illness [name] is a 67yoM who comes to [facility] on 04/09/23 c confusion, SOB. Was here in Jan with recurrent DVT. Pt admitted with sepsis 2/4 due to PNA. 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 Pt previously on hospice at home, but no longer interested in this. Anticoaglulation being held as of 2/8 for pending port removal and TEE.    PT Comments  Patient alert, agreeable to PT/OT, seen as a co-treat due to pt not transferring OOB in several days. However, he demonstrated bed mobility and lateral scooting to the recliner with supervision x2. Pt did rely on bed features (elevated HOB, bed rail at end of bed) to successfully come up into sitting. He was also able to perform seated chair pushups x10 without assistance. Pt left with OT at bedside for ADLs. The patient would benefit from further skilled PT intervention to continue to progress towards goals.    If plan is discharge home, recommend the following: A little help with walking and/or transfers;A little help with bathing/dressing/bathroom;Assist for transportation   Can travel by private vehicle     No  Equipment Recommendations  None recommended by PT    Recommendations for Other Services       Precautions / Restrictions Precautions Precautions: Fall Recall of Precautions/Restrictions: Intact Precaution/Restrictions Comments: B AKA Restrictions Weight Bearing Restrictions Per Provider Order: No     Mobility  Bed Mobility Overal bed mobility: Needs Assistance Bed Mobility: Supine to Sit     Supine to sit: Supervision, HOB elevated, Used rails     General bed mobility comments: SUP to reach EOB with HOB elevated fully and use of rails    Transfers Overall transfer level: Needs  assistance Equipment used: None Transfers: Bed to chair/wheelchair/BSC            Lateral/Scoot Transfers: Supervision, +2 safety/equipment      Ambulation/Gait               General Gait Details: non ambulatory at baseline   Stairs             Wheelchair Mobility     Tilt Bed    Modified Rankin (Stroke Patients Only)       Balance Overall balance assessment: Needs assistance Sitting-balance support: Bilateral upper extremity supported Sitting balance-Leahy Scale: Good                                      Communication Communication Communication: No apparent difficulties  Cognition Arousal: Alert Behavior During Therapy: WFL for tasks assessed/performed   PT - Cognitive impairments: No apparent impairments                                Cueing    Exercises Other Exercises Other Exercises: Pt performed chair push-ups x10 reps in recliner with no issues.    General Comments General comments (skin integrity, edema, etc.): blood on chux pad-nurse notified      Pertinent Vitals/Pain Pain Assessment Pain Assessment: Faces Faces Pain Scale: Hurts even more Pain Location: buttocks Pain Descriptors / Indicators: Tender, Sore Pain Intervention(s): Monitored during session, Repositioned    Home Living  Prior Function            PT Goals (current goals can now be found in the care plan section) Progress towards PT goals: Progressing toward goals    Frequency    Min 1X/week      PT Plan      Co-evaluation PT/OT/SLP Co-Evaluation/Treatment: Yes Reason for Co-Treatment: For patient/therapist safety PT goals addressed during session: Mobility/safety with mobility OT goals addressed during session: ADL's and self-care      AM-PAC PT "6 Clicks" Mobility   Outcome Measure  Help needed turning from your back to your side while in a flat bed without using bedrails?:  None Help needed moving from lying on your back to sitting on the side of a flat bed without using bedrails?: None Help needed moving to and from a bed to a chair (including a wheelchair)?: None Help needed standing up from a chair using your arms (e.g., wheelchair or bedside chair)?: Total Help needed to walk in hospital room?: Total Help needed climbing 3-5 steps with a railing? : Total 6 Click Score: 15    End of Session   Activity Tolerance: Patient tolerated treatment well Patient left: in chair;with call bell/phone within reach;with chair alarm set Nurse Communication: Mobility status PT Visit Diagnosis: Other abnormalities of gait and mobility (R26.89)     Time: 1040-1059 PT Time Calculation (min) (ACUTE ONLY): 19 min  Charges:    $Therapeutic Activity: 8-22 mins PT General Charges $$ ACUTE PT VISIT: 1 Visit                     Olga Coaster PT, DPT 2:06 PM,04/18/23

## 2023-04-18 NOTE — Progress Notes (Signed)
Date of Admission:  04/09/2023   T ID: [name] is a 68 y.o. male  Principal Problem:   HAP (hospital-acquired pneumonia) Active Problems:   Prostate cancer metastatic to bone [facility])   PE (pulmonary thromboembolism) (HCC)   Essential hypertension   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   Ogilvie syndrome   Severe sepsis (HCC)   AKI (acute kidney injury) (HCC)   MRSA bacteremia   Pulmonary infiltrates   Septic embolism (HCC)   Palliative care encounter   Acute respiratory failure with hypoxia (HCC)   Morbid obesity (HCC)    Subjective: Pt is feeling much better  Medications:   acetaminophen  650 mg Oral Q6H   [START ON 04/19/2023] buPROPion  100 mg Oral Daily   Followed by   Melene Muller ON 04/22/2023] buPROPion  75 mg Oral Daily   Followed by   Melene Muller ON 04/25/2023] buPROPion  50 mg Oral Daily   Chlorhexidine Gluconate Cloth  6 each Topical Daily   dicyclomine  10 mg Oral TID AC & HS   Fe Fum-Vit C-Vit B12-FA  1 capsule Oral QPC breakfast   furosemide  20 mg Oral Daily   gabapentin  200 mg Oral TID   lactulose  10 g Oral TID   leptospermum manuka honey  1 Application Topical Daily   lidocaine  1 patch Transdermal Q24H   nystatin cream   Topical BID   polyethylene glycol  17 g Oral BID   potassium chloride  40 mEq Oral BID   [START ON 04/19/2023] predniSONE  20 mg Oral Q breakfast   Followed by   Melene Muller ON 04/21/2023] predniSONE  10 mg Oral Q breakfast   Warfarin - Pharmacist Dosing Inpatient   Does not apply q1600    Objective: Vital signs in last 24 hours: Patient Vitals for the past 24 hrs:  BP Temp Temp src Pulse Resp SpO2  04/18/23 1553 120/71 97.9 F (36.6 C) -- 62 -- 97 %  04/18/23 1328 109/74 98 F (36.7 C) -- 71 -- 99 %  04/18/23 0907 (!) 119/53 97.6 F (36.4 C) -- 69 -- 98 %  04/18/23 0357 131/75 97.7 F (36.5 C) -- (!) 48 18 100 %  04/17/23 2313 (!) 142/82 97.7 F (36.5 C) -- (!) 56 20 100 %  04/17/23 2107 121/68 97.8 F (36.6  C) Oral (!) 57 20 97 %      PHYSICAL EXAM:  General:awake and alert, no resp distress Previous Port site removed covered with dressing Lungs: Bilateral air entry Heart: S1-S2. Abdomen: Soft, non-tender,not distended. Bowel sounds normal. No masses Extremities: Bilateral AKA  skin: No rashes or lesions. Or bruising Lymph: Cervical, supraclavicular normal. Neurologic: Grossly non-focal  Lab Results    Latest Ref Rng & Units 04/18/2023    6:42 AM 04/17/2023    4:22 AM 04/16/2023    6:03 AM  CBC  WBC 4.0 - 10.5 K/uL 9.0  9.0  9.8   Hemoglobin 13.0 - 17.0 g/dL 16.1  09.6  04.5   Hematocrit 39.0 - 52.0 % 31.4  32.1  34.2   Platelets 150 - 400 K/uL 319  356  403        Latest Ref Rng & Units 04/18/2023    6:42 AM 04/17/2023    4:22 AM 04/16/2023    6:03 AM  CMP  Glucose 70 - 99 mg/dL 91  95  409   BUN 8 - 23 mg/dL 21  31  38   Creatinine 0.61 - 1.24 mg/dL 9.14  7.82  9.56   Sodium 135 - 145 mmol/L 140  138  138   Potassium 3.5 - 5.1 mmol/L 3.1  2.7  3.6   Chloride 98 - 111 mmol/L 107  106  103   CO2 22 - 32 mmol/L 26  24  25    Calcium 8.9 - 10.3 mg/dL 9.3  9.2  9.6   Total Protein 6.5 - 8.1 g/dL 5.8  5.9  6.8   Total Bilirubin 0.0 - 1.2 mg/dL 0.6  0.7  0.5   Alkaline Phos 38 - 126 U/L 58  59  65   AST 15 - 41 U/L 24  30  35   ALT 0 - 44 U/L 30  35  36       Microbiology: [facility] MRSA on 04/09/23 both sets 2/7 BC- NG So far 2/9 cath tip  MRSA  Studies/Results: No results found.     Assessment/Plan:  MRSA bacteremia With port in place and multiple nodules in the lungs concern for septic emboli and endocarditis of tricuspid valve  repeat blood culture from 2/7 NG PORT  removed  04/14/23 and culture is MRSA TEE neg for endocarditis  On vancomycin adjusted to crcl Watch closely because of AKI Daptomycin is currently not an option because of  lung infiltrate Will need 4 weeks of antibiotic, after 2 weeks IV vanco 04/28/23 can switch to PO linezolid 600mg  PO BID for 2 more  week Wellbutrin to be tapered and stopped  because of risk of serotonin syndrome with linezolid- Though It is a lower risk ffor serotonin syndrome compared to  SSRI /SNRI but he is on high dose and a long acting form. 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

## 2023-04-18 NOTE — Plan of Care (Signed)
  Problem: Clinical Measurements: Goal: Diagnostic test results will improve Outcome: Progressing   Problem: Respiratory: Goal: Ability to maintain adequate ventilation will improve Outcome: Progressing   Problem: Coping: Goal: Level of anxiety will decrease Outcome: Progressing   Problem: Pain Managment: Goal: General experience of comfort will improve and/or be controlled Outcome: Progressing

## 2023-04-18 NOTE — Consult Note (Signed)
PHARMACY - ANTICOAGULATION CONSULT NOTE  Pharmacy Consult for Warfarin Indication: hx recurrent VTE  Patient Measurements: Height: 4\' 10"  (147.3 cm) Weight: (!) 136.2 kg (300 lb 4.8 oz) IBW/kg (Calculated) : 45.4  Labs: Recent Labs    04/09/23 2025 04/09/23 2132 04/10/23 0127 04/10/23 1051  HGB 10.9*  --   --  10.8*  HCT 31.4*  --   --  31.4*  PLT 299  --   --  229  APTT  --   --  55*  --   LABPROT  --   --  30.6*  --   INR  --   --  2.9*  --   CREATININE 3.89*  --   --  3.11*  TROPONINIHS  --  22*  --   --    Estimated Creatinine Clearance: 26.6 mL/min (A) (by C-G formula based on SCr of 3.11 mg/dL (H)).  Medical History: Past Medical History:  Diagnosis Date   Depression    History of blood clots 08/13/2011   History of left knee replacement    HTN (hypertension)    Lymphedema    lt leg-per pt   PE (pulmonary thromboembolism) (HCC)    Pressure injury of skin, unspecified injury stage, unspecified location    S/P AKA (above knee amputation) bilateral (HCC)    rt 11/24/07 and lt 01/11/17   PTA warfarin dosing: Warfarin dosing difficult to determine. Went to speak with patient who could not give a firm regimen. Base dose is 5 mg but phones in INR weekly with clinic. He estimates an average of warfarin 6 mg daily. Last dose was 2/4 morning.  Assessment: 68 y.o. 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 Patient was hospitalized last month for recurrent VTE. Patient was on warfarin at the time of that VTE, but at presentation INR was subtherapeutic at 1.2 due to lack of enoxaparin bridge, and so warfarin was seen as not a therapeutic failure. Pt has a large body habitus, and will require higher doses of warfarin. Unsure what his dose was outpatient if the clinic made any adjustments, so not sure what caused the  supratherapeutic INR. Last admission we had to give 10-15 mg doses to get INR therapeutic. No major DDI. Albumin is low at 2.8, which would allow more free warfarin.    Goal of Therapy:  INR 2-3 Monitor platelets by anticoagulation protocol: Yes  Monitoring:  Date INR Warfarin Dose   2/5 2.9 6 mg  2/6 4 Hold  2/7 4.5 Hold, Vit K 1mg  IV for upcoming port removal   2/8 2.8 HOLD for procedure  2/9 2.1 HOLD for procedure  2/10 1.9 10 mg  2/11 1.9 10 mg  2/12 3 2.5 mg  2/13 3.4 5 mg   Plan:  INR is supratherapeutic. There was a big jump from 2/11 to 2/12, maybe pt is more sensitive to warfarin, then previous admission. Will give warfarin 5 mg x 1 tonight, predict INR to trend down 2/14.  When pt received 7.5 mg x 2 doses last admission the INR dropped to 1.5 after receiving multiple 15 mg doses. Currently estimate pt will need to be on 7 mg of daily, but this will depend on the INR on 2/14.   Paschal Dopp, PharmD Clinical Pharmacist 04/18/2023

## 2023-04-19 ENCOUNTER — Telehealth: Payer: Self-pay | Admitting: *Deleted

## 2023-04-19 DIAGNOSIS — B9562 Methicillin resistant Staphylococcus aureus infection as the cause of diseases classified elsewhere: Secondary | ICD-10-CM | POA: Diagnosis not present

## 2023-04-19 DIAGNOSIS — R7881 Bacteremia: Secondary | ICD-10-CM | POA: Diagnosis not present

## 2023-04-19 LAB — COMPREHENSIVE METABOLIC PANEL
ALT: 36 U/L (ref 0–44)
AST: 29 U/L (ref 15–41)
Albumin: 2.6 g/dL — ABNORMAL LOW (ref 3.5–5.0)
Alkaline Phosphatase: 58 U/L (ref 38–126)
Anion gap: 6 (ref 5–15)
BUN: 16 mg/dL (ref 8–23)
CO2: 25 mmol/L (ref 22–32)
Calcium: 9.1 mg/dL (ref 8.9–10.3)
Chloride: 108 mmol/L (ref 98–111)
Creatinine, Ser: 0.65 mg/dL (ref 0.61–1.24)
GFR, Estimated: >60 mL/min (ref >60)
Glucose, Bld: 90 mg/dL (ref 70–99)
Potassium: 3.3 mmol/L — ABNORMAL LOW (ref 3.5–5.1)
Sodium: 139 mmol/L (ref 135–145)
Total Bilirubin: 0.5 mg/dL (ref 0.0–1.2)
Total Protein: 6 g/dL — ABNORMAL LOW (ref 6.5–8.1)

## 2023-04-19 LAB — CBC WITH DIFFERENTIAL/PLATELET
Abs Immature Granulocytes: 0.24 10*3/uL — ABNORMAL HIGH (ref 0.00–0.07)
Basophils Absolute: 0 10*3/uL (ref 0.0–0.1)
Basophils Relative: 0 %
Eosinophils Absolute: 0.1 10*3/uL (ref 0.0–0.5)
Eosinophils Relative: 1 %
HCT: 29.7 % — ABNORMAL LOW (ref 39.0–52.0)
Hemoglobin: 9.7 g/dL — ABNORMAL LOW (ref 13.0–17.0)
Immature Granulocytes: 3 %
Lymphocytes Relative: 7 %
Lymphs Abs: 0.6 10*3/uL — ABNORMAL LOW (ref 0.7–4.0)
MCH: 30.5 pg (ref 26.0–34.0)
MCHC: 32.7 g/dL (ref 30.0–36.0)
MCV: 93.4 fL (ref 80.0–100.0)
Monocytes Absolute: 0.7 10*3/uL (ref 0.1–1.0)
Monocytes Relative: 7 %
Neutro Abs: 8.1 10*3/uL — ABNORMAL HIGH (ref 1.7–7.7)
Neutrophils Relative %: 82 %
Platelets: 271 10*3/uL (ref 150–400)
RBC: 3.18 MIL/uL — ABNORMAL LOW (ref 4.22–5.81)
RDW: 14.1 % (ref 11.5–15.5)
WBC: 9.8 10*3/uL (ref 4.0–10.5)
nRBC: 0 % (ref 0.0–0.2)

## 2023-04-19 LAB — PROTIME-INR
INR: 3 — ABNORMAL HIGH (ref 0.8–1.2)
Prothrombin Time: 31.2 s — ABNORMAL HIGH (ref 11.4–15.2)

## 2023-04-19 LAB — MAGNESIUM: Magnesium: 1.7 mg/dL (ref 1.7–2.4)

## 2023-04-19 LAB — GLUCOSE, CAPILLARY
Glucose-Capillary: 74 mg/dL (ref 70–99)
Glucose-Capillary: 118 mg/dL — ABNORMAL HIGH (ref 70–99)

## 2023-04-19 LAB — PHOSPHORUS: Phosphorus: 2.8 mg/dL (ref 2.5–4.6)

## 2023-04-19 MED ORDER — POTASSIUM CHLORIDE CRYS ER 20 MEQ PO TBCR
40.0000 meq | EXTENDED_RELEASE_TABLET | Freq: Two times a day (BID) | ORAL | Status: AC
Start: 2023-04-19 — End: 2023-04-19
  Administered 2023-04-19 (×2): 40 meq via ORAL
  Filled 2023-04-19 (×2): qty 2

## 2023-04-19 MED ORDER — WARFARIN SODIUM 3 MG PO TABS
3.0000 mg | ORAL_TABLET | Freq: Once | ORAL | Status: AC
  Administered 2023-04-19: 3 mg via ORAL
  Filled 2023-04-19: qty 1

## 2023-04-19 NOTE — Telephone Encounter (Signed)
He would like to see you in hospital room 259 or call him at [phone]

## 2023-04-19 NOTE — TOC Progression Note (Signed)
Transition of Care [facility]) - Progression Note    Patient Details  Name: [name] MRN: [id number] Date of Birth: Jul 21, 1955  [facility]) CM/SW Contact  Hetty Ely, RN Phone Number: 04/19/2023, 4:01 PM  Clinical Narrative:  Spoke with patient at bedside, he requests Bariatric Bed for home, the current Hospital Bed is too small. Called [employer], spoke with Cletis Athens, patients weight meet criteria, MD to place order, patient will be processed for Bariatric Bed. Patient notified.          Expected Discharge Plan and Services                                               Social Determinants of Health (SDOH) Interventions SDOH Screenings   Food Insecurity: No Food Insecurity (04/11/2023)  Housing: Low Risk  (04/11/2023)  Transportation Needs: No Transportation Needs (04/11/2023)  Utilities: Not At Risk (04/11/2023)  Financial Resource Strain: High Risk (07/28/2021)   Received from [facility], [facility]  Physical Activity: Sufficiently Active (07/28/2021)   Received from [facility], [facility]  Social Connections: Moderately Integrated (04/11/2023)  Stress: Stress Concern Present (08/01/2021)   Received from [facility], [facility]  Tobacco Use: Medium Risk (04/11/2023)    Readmission Risk Interventions    03/25/2023   10:53 AM  Readmission Risk Prevention Plan  Transportation Screening Complete  PCP or Specialist Appt within 5-7 Days Complete  Home Care Screening Complete  Medication Review (RN CM) Complete

## 2023-04-19 NOTE — Consult Note (Signed)
PHARMACY - ANTICOAGULATION CONSULT NOTE  Pharmacy Consult for Warfarin Indication: hx recurrent VTE  Patient Measurements: Height: 4\' 10"  (147.3 cm) Weight: (!) 136.2 kg (300 lb 4.8 oz) IBW/kg (Calculated) : 45.4  Labs: Recent Labs    04/09/23 2025 04/09/23 2132 04/10/23 0127 04/10/23 1051  HGB 10.9*  --   --  10.8*  HCT 31.4*  --   --  31.4*  PLT 299  --   --  229  APTT  --   --  55*  --   LABPROT  --   --  30.6*  --   INR  --   --  2.9*  --   CREATININE 3.89*  --   --  3.11*  TROPONINIHS  --  22*  --   --    Estimated Creatinine Clearance: 26.6 mL/min (A) (by C-G formula based on SCr of 3.11 mg/dL (H)).  Medical History: Past Medical History:  Diagnosis Date   Depression    History of blood clots 08/13/2011   History of left knee replacement    HTN (hypertension)    Lymphedema    lt leg-per pt   PE (pulmonary thromboembolism) (HCC)    Pressure injury of skin, unspecified injury stage, unspecified location    S/P AKA (above knee amputation) bilateral (HCC)    rt 11/24/07 and lt 01/11/17   PTA warfarin dosing: Warfarin dosing difficult to determine. Went to speak with patient who could not give a firm regimen. Base dose is 5 mg but phones in INR weekly with clinic. He estimates an average of warfarin 6 mg daily. Last dose was 2/4 morning.  Assessment: 68 y.o. 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 Patient was hospitalized last month for recurrent VTE. Patient was on warfarin at the time of that VTE, but at presentation INR was subtherapeutic at 1.2 due to lack of enoxaparin bridge, and so warfarin was seen as not a therapeutic failure. Pt has a large body habitus, and will require higher doses of warfarin. Unsure what his dose was outpatient if the clinic made any adjustments, so not sure what caused the  supratherapeutic INR. Last admission we had to give 10-15 mg doses to get INR therapeutic. No major DDI. Albumin is low at 2.8, which would allow more free warfarin.   Goal of Therapy:  INR 2-3 Monitor platelets by anticoagulation protocol: Yes  Monitoring: Date INR Warfarin Dose   2/5 2.9 6 mg  2/6 4 Hold  2/7 4.5 Hold, Vit K 1mg  IV for upcoming port removal   2/8 2.8 HOLD for procedure  2/9 2.1 HOLD for procedure  2/10 1.9 10 mg  2/11 1.9 10 mg  2/12 3 2.5 mg  2/13 3.4 5 mg  2/14 3.0 3mg  ordered           Plan:  INR at high end of therapeutic (received 27.5mg  in the last 7 days) with hgb at 9.7(drop from 10.1) and plt 271. No s/sx pf bleeding reported by nursing. Will order warfarin 3mg  po tonight and continue daily INR until stable.    Yosiah Jasmin Rodriguez-Guzman PharmD, BCPS 04/19/2023 8:13 AM

## 2023-04-19 NOTE — Progress Notes (Addendum)
Progress Note    [name]  ZOX:[id number] DOB: 08/14/1955  DOA: 04/09/2023 PCP: Dione Housekeeper, MD      Brief Narrative:    Medical records reviewed and are as summarized below:  [name] is a 68 y.o. male  stage IV prostate cancer, recurrent VTE, hypertension, PVD status post AKA, decubitus ulcers, who was admitted on 1/18 with shortness of breath and was found to have recurrent DVT despite Eliquis and then Coumadin (not technically a failure as he was not bridged.)  He had been unable to afford Lovenox as an outpatient.  During hospitalization he was noted to have volume overload and was diuresed.  Stay was also complicated by ileus for stool impaction that was treated aggressively with a bowel regimen.  He was discharged in stable condition on 1/31.  He returned to the ED on 2/4 due to increasing confusion and shortness of breath.  He was found to be saturating 89% on room air.  EMS placed patient on CPAP prior to arrival.  In the ED he was found to be septic with fever of 101.9, respiratory rate 31, leukocytosis 20K, and hypoxia.  Chest x-ray revealed left basilar opacity.  He was placed on BiPAP due to work of breathing and hypoxemia.  He failed weaning trial due to work of breathing though he did have good oxygen saturations.  He was admitted for management of healthcare associated pneumonia and sepsis. Hospital stay has been complex.  CT revealed multiple septic emboli in the lungs.  Port removal was attempted early on as this was suspected nidus of infection, however his INR was supratherapeutic which prevented port removal.  He did receive low-dose vitamin K on 2/7 and ultimately was able to have port removed on 2/9.  The tip culture was positive for MRSA infection.  He underwent TEE on 2/11.  He had further respiratory compromise throughout admission and was on prolonged BiPAP.  Further respiratory compromise appeared to be secondary to acute CHF  exacerbation.  He was treated with IV diuresis and had more than 15 L net fluid loss since admission.  Respiratory failure has improved and he has been weaned off of oxygen.   2/12: Patient currently stable, does not want to go to SNF, asking to complete IV antibiotics and then go home, stating that he had everything in place at home to help.  TEE was negative, per ID will need 4 weeks of antibiotics.   2/13: Continue to improve.  Will complete IV vancomycin seen on 2/23 before leaving home with home health.  Patient will need 2 more weeks of linezolid afterwards.  ID is tapering home Wellbutrin so he can get linezolid.   Chart reviewed   Assessment/Plan:   Principal Problem:   HAP (hospital-acquired pneumonia) Active Problems:   Severe sepsis (HCC)   PE (pulmonary thromboembolism) (HCC)   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   Ogilvie syndrome   AKI (acute kidney injury) (HCC)   Prostate cancer metastatic to bone [facility])   Essential hypertension   MRSA bacteremia   Pulmonary infiltrates   Septic embolism (HCC)   Palliative care encounter   Acute respiratory failure with hypoxia (HCC)   Morbid obesity (HCC)     Body mass index is 62.66 kg/m.  (Morbid obesity)    Severe sepsis, MRSA bacteremia, pneumonia, septic pulmonary emboli: Continue IV vancomycin through 04/28/2023.  This will be followed by 2 weeks of oral linezolid. Wellbutrin is  being tapered off to allow linezolid use in the near future. Port-A-Cath was removed on 04/14/2023 and tip culture was positive for MRSA. TEE was negative for vegetation   Acute on chronic diastolic CHF: S/p treatment with IV Lasix.  Continue oral Lasix.   Pulmonary embolism, history of recurrent venous thromboembolism while on Eliquis: Continue warfarin   Acute urinary retention: Foley catheter was removed on 04/19/2023 for voiding trial.  Hypokalemia: Continue potassium repletion   Stage IV prostate cancer with  metastasis to the bones: Analgesics as needed for pain.  Follow-up with oncologist as an outpatient.   Bilateral upper extremity weakness: Improving.  This is likely from deconditioning.   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  Bariatric Hospital bed has been ordered.  Follow-up with TOC.  Discussed with Levi Aland, case manager, via secure chat   Diet Order             Diet regular Fluid consistency: Thin  Diet effective now                            Consultants: Cardiologist ID specialist Palliative care   Procedures: TEE on 04/16/2023 (no vegetations noted) Port-A-Cath removal by IR on 04/14/2023    Medications:    acetaminophen  650 mg Oral Q6H   buPROPion  100 mg Oral Daily   Followed by   Melene Muller ON 04/22/2023] buPROPion  75 mg Oral Daily   Followed by   Melene Muller ON 04/25/2023] buPROPion  50 mg Oral Daily   Chlorhexidine Gluconate Cloth  6 each Topical Daily   dicyclomine  10 mg Oral TID AC & HS   Fe Fum-Vit C-Vit B12-FA  1 capsule Oral QPC breakfast   furosemide  20 mg Oral Daily   gabapentin  200 mg Oral TID   lactulose  10 g Oral TID   leptospermum manuka honey  1 Application Topical Daily   lidocaine  1 patch Transdermal Q24H   nystatin cream   Topical BID   polyethylene glycol  17 g Oral BID   potassium chloride  40 mEq Oral BID   predniSONE  20 mg Oral Q breakfast   Followed by   Melene Muller ON 04/21/2023] predniSONE  10 mg Oral Q breakfast   warfarin  3 mg Oral ONCE-1600   Warfarin - Pharmacist Dosing Inpatient   Does not apply q1600   Continuous Infusions:  vancomycin 750 mg (04/19/23 1001)     Anti-infectives (From admission, onward)    Start     Dose/Rate Route  Frequency Ordered Stop   04/18/23 0900  vancomycin (VANCOREADY) IVPB 750 mg/150 mL        750 mg 75 mL/hr over 120 Minutes Intravenous Every 12 hours 04/18/23 0818     04/15/23 0800  vancomycin (VANCOREADY) IVPB 1500 mg/300 mL  Status:  Discontinued        1,500 mg 100 mL/hr over 180 Minutes Intravenous Every 24 hours 04/15/23 0649 04/18/23 0818   04/13/23 1030  vancomycin variable dose per unstable renal function (pharmacist dosing)  Status:  Discontinued         Does not apply See admin instructions 04/13/23 1031 04/15/23 0711   04/12/23 1000  vancomycin (VANCOREADY) IVPB 1500 mg/300  mL  Status:  Discontinued        1,500 mg 100 mL/hr over 180 Minutes Intravenous Every 24 hours 04/12/23 0831 04/13/23 1031   04/11/23 1000  vancomycin (VANCOREADY) IVPB 1500 mg/300 mL        1,500 mg 75 mL/hr over 240 Minutes Intravenous  Once 04/11/23 0828 04/11/23 1358   04/10/23 0308  vancomycin variable dose per unstable renal function (pharmacist dosing)  Status:  Discontinued         Does not apply See admin instructions 04/10/23 0308 04/12/23 0831   04/10/23 0145  vancomycin (VANCOREADY) IVPB 1500 mg/300 mL        1,500 mg 100 mL/hr over 180 Minutes Intravenous  Once 04/10/23 0131 04/10/23 0546   04/10/23 0130  vancomycin (VANCOCIN) IVPB 1000 mg/200 mL premix  Status:  Discontinued        1,000 mg 200 mL/hr over 60 Minutes Intravenous  Once 04/10/23 0120 04/10/23 0131   04/09/23 2100  vancomycin (VANCOCIN) IVPB 1000 mg/200 mL premix        1,000 mg 200 mL/hr over 60 Minutes Intravenous  Once 04/09/23 2047 04/10/23 0113   04/09/23 2100  ceFEPIme (MAXIPIME) 2 g in sodium chloride 0.9 % 100 mL IVPB        2 g 200 mL/hr over 30 Minutes Intravenous  Once 04/09/23 2047 04/10/23 0113              Family Communication/Anticipated D/C date and plan/Code Status   DVT prophylaxis:  warfarin (COUMADIN) tablet 3 mg     Code Status: Limited: Do not attempt resuscitation (DNR) -DNR-LIMITED -Do Not  Intubate/DNI   Family Communication: None Disposition Plan: Plan to discharge home   Status is: Inpatient Remains inpatient appropriate because: IV antibiotics for bacteremia       Subjective:   Interval events noted.  No shortness of breath or chest pain.  The hospital bed to be delivered to his house prior to discharge.  He said the bed that he has at home is very uncomfortable and does not work for him because of his weight.  Objective:    Vitals:   04/18/23 1553 04/18/23 2212 04/19/23 0045 04/19/23 0816  BP: 120/71 116/63 129/70 116/61  Pulse: 62 61 62 62  Resp:  20 17 18   Temp: 97.9 F (36.6 C) 98 F (36.7 C) 98.5 F (36.9 C) 98.6 F (37 C)  TempSrc:   Oral   SpO2: 97% 98% 96% 97%  Weight:      Height:       No data found.   Intake/Output Summary (Last 24 hours) at 04/19/2023 1116 Last data filed at 04/18/2023 1800 Gross per 24 hour  Intake 720 ml  Output 900 ml  Net -180 ml   Filed Weights   04/09/23 2019  Weight: 136 kg    Exam:  GEN: NAD SKIN: Warm and dry EYES: No pallor or icterus ENT: MMM CV: RRR PULM: CTA B ABD: soft, obese, NT, +BS CNS: AAO x 3, non focal EXT: Bilateral AKA    Pressure Injury 03/23/23 Ischial tuberosity Left Stage 3 -  Full thickness tissue loss. Subcutaneous fat may be visible but bone, tendon or muscle are NOT exposed. 1 cm x 1 cm x 0.1 cm 50% pink moist 50% yellow (Active)  03/23/23   Location: Ischial tuberosity  Location Orientation: Left  Staging: Stage 3 -  Full thickness tissue loss. Subcutaneous fat may be visible but bone, tendon or muscle are  NOT exposed.  Wound Description (Comments): 1 cm x 1 cm x 0.1 cm 50% pink moist 50% yellow  Present on Admission: Yes  Dressing Type Foam - Lift dressing to assess site every shift 04/17/23 2027     Pressure Injury 03/26/23 Buttocks Left;Lateral Stage 2 -  Partial thickness loss of dermis presenting as a shallow open injury with a red, pink wound bed without slough.  (Active)  03/26/23 0000  Location: Buttocks  Location Orientation: Left;Lateral  Staging: Stage 2 -  Partial thickness loss of dermis presenting as a shallow open injury with a red, pink wound bed without slough.  Wound Description (Comments):   Present on Admission: Yes  Dressing Type Foam - Lift dressing to assess site every shift 04/18/23 0800     Data Reviewed:   I have personally reviewed following labs and imaging studies:  Labs: Labs show the following:   Basic Metabolic Panel: Recent Labs  Lab 04/16/23 0211 04/16/23 0603 04/17/23 0422 04/18/23 0642 04/19/23 0650  NA 137 138 138 140 139  K 3.7 3.6 2.7* 3.1* 3.3*  CL 102 103 106 107 108  CO2 25 25 24 26 25   GLUCOSE 156* 126* 95 91 90  BUN 38* 38* 31* 21 16  CREATININE 1.00 0.92 0.78 0.72 0.65  CALCIUM 9.4 9.6 9.2 9.3 9.1  MG 1.7 1.8 1.6* 1.9 1.7  PHOS 3.2 3.3 2.6 2.9 2.8   GFR Estimated Creatinine Clearance: 103.4 mL/min (by C-G formula based on SCr of 0.65 mg/dL). Liver Function Tests: Recent Labs  Lab 04/16/23 0211 04/16/23 0603 04/17/23 0422 04/18/23 0642 04/19/23 0650  AST 36 35 30 24 29   ALT 36 36 35 30 36  ALKPHOS 65 65 59 58 58  BILITOT 0.6 0.5 0.7 0.6 0.5  PROT 6.7 6.8 5.9* 5.8* 6.0*  ALBUMIN 2.8* 2.8* 2.7* 2.6* 2.6*   No results for input(s): "LIPASE", "AMYLASE" in the last 168 hours. No results for input(s): "AMMONIA" in the last 168 hours. Coagulation profile Recent Labs  Lab 04/15/23 0548 04/16/23 0211 04/17/23 0422 04/18/23 0642 04/19/23 0650  INR 1.9* 1.9* 3.0* 3.4* 3.0*    CBC: Recent Labs  Lab 04/16/23 0211 04/16/23 0603 04/17/23 0422 04/18/23 0642 04/19/23 0650  WBC 10.4 9.8 9.0 9.0 9.8  NEUTROABS 9.1* 8.3* 7.0 7.0 8.1*  HGB 11.4* 11.5* 10.8* 10.1* 9.7*  HCT 34.7* 34.2* 32.1* 31.4* 29.7*  MCV 93.5 91.0 91.7 94.3 93.4  PLT 403* 403* 356 319 271   Cardiac Enzymes: No results for input(s): "CKTOTAL", "CKMB", "CKMBINDEX", "TROPONINI" in the last 168 hours. BNP  (last 3 results) No results for input(s): "PROBNP" in the last 8760 hours. CBG: Recent Labs  Lab 04/19/23 0815  GLUCAP 74   D-Dimer: No results for input(s): "DDIMER" in the last 72 hours. Hgb A1c: No results for input(s): "HGBA1C" in the last 72 hours. Lipid Profile: No results for input(s): "CHOL", "HDL", "LDLCALC", "TRIG", "CHOLHDL", "LDLDIRECT" in the last 72 hours. Thyroid function studies: No results for input(s): "TSH", "T4TOTAL", "T3FREE", "THYROIDAB" in the last 72 hours.  Invalid input(s): "FREET3" Anemia work up: No results for input(s): "VITAMINB12", "FOLATE", "FERRITIN", "TIBC", "IRON", "RETICCTPCT" in the last 72 hours. Sepsis Labs: Recent Labs  Lab 04/16/23 0603 04/17/23 0422 04/18/23 0642 04/19/23 0650  WBC 9.8 9.0 9.0 9.8    Microbiology Recent Results (from the past 240 hours)  Resp panel by RT-PCR (RSV, Flu A&B, Covid) Anterior Nasal Swab     Status: None   Collection Time:  04/09/23  8:19 PM   Specimen: Anterior Nasal Swab  Result Value Ref Range Status   SARS Coronavirus 2 by RT PCR NEGATIVE NEGATIVE Final    Comment: (NOTE) SARS-CoV-2 target nucleic acids are NOT DETECTED.  The SARS-CoV-2 RNA is generally detectable in upper respiratory specimens during the acute phase of infection. The lowest concentration of SARS-CoV-2 viral copies this assay can detect is 138 copies/mL. A negative result does not preclude SARS-Cov-2 infection and should not be used as the sole basis for treatment or other patient management decisions. A negative result may occur with  improper specimen collection/handling, submission of specimen other than nasopharyngeal swab, presence of viral mutation(s) within the areas targeted by this assay, and inadequate number of viral copies(<138 copies/mL). A negative result must be combined with clinical observations, patient history, and epidemiological information. The expected result is Negative.  Fact Sheet for Patients:   BloggerCourse.com  Fact Sheet for Healthcare Providers:  SeriousBroker.it  This test is no t yet approved or cleared by the [place] FDA and  has been authorized for detection and/or diagnosis of SARS-CoV-2 by FDA under an Emergency Use Authorization (EUA). This EUA will remain  in effect (meaning this test can be used) for the duration of the COVID-19 declaration under Section 564(b)(1) of the Act, 21 U.S.C.section 360bbb-3(b)(1), unless the authorization is terminated  or revoked sooner.       Influenza A by PCR NEGATIVE NEGATIVE Final   Influenza B by PCR NEGATIVE NEGATIVE Final    Comment: (NOTE) The Xpert Xpress SARS-CoV-2/FLU/RSV plus assay is intended as an aid in the diagnosis of influenza from Nasopharyngeal swab specimens and should not be used as a sole basis for treatment. Nasal washings and aspirates are unacceptable for Xpert Xpress SARS-CoV-2/FLU/RSV testing.  Fact Sheet for Patients: BloggerCourse.com  Fact Sheet for Healthcare Providers: SeriousBroker.it  This test is not yet approved or cleared by the [place] FDA and has been authorized for detection and/or diagnosis of SARS-CoV-2 by FDA under an Emergency Use Authorization (EUA). This EUA will remain in effect (meaning this test can be used) for the duration of the COVID-19 declaration under Section 564(b)(1) of the Act, 21 U.S.C. section 360bbb-3(b)(1), unless the authorization is terminated or revoked.     Resp Syncytial Virus by PCR NEGATIVE NEGATIVE Final    Comment: (NOTE) Fact Sheet for Patients: BloggerCourse.com  Fact Sheet for Healthcare Providers: SeriousBroker.it  This test is not yet approved or cleared by the [place] FDA and has been authorized for detection and/or diagnosis of SARS-CoV-2 by FDA under an Emergency Use  Authorization (EUA). This EUA will remain in effect (meaning this test can be used) for the duration of the COVID-19 declaration under Section 564(b)(1) of the Act, 21 U.S.C. section 360bbb-3(b)(1), unless the authorization is terminated or revoked.  Performed at [facility], [address] [place], [place] [address]   Blood Culture (routine x 2)     Status: Abnormal (Preliminary result)   Collection Time: 04/09/23  8:19 PM   Specimen: Right Antecubital; Blood  Result Value Ref Range Status   Specimen Description   Final    RIGHT ANTECUBITAL BLOOD Performed at [facility], [address], [place], [place] [address]    Special Requests   Final    BOTTLES DRAWN AEROBIC AND ANAEROBIC Blood Culture results may not be optimal due to an inadequate volume of blood received in culture bottles Performed at [facility], [address] [place], [place] [address]    Culture  Setup Time   Final    GRAM POSITIVE COCCI IN BOTH AEROBIC AND ANAEROBIC BOTTLES CRITICAL RESULT CALLED TO, READ BACK BY AND VERIFIED WITH: TREY GREENWOOD PHARMD 1137 04/10/23 HNM GRAM STAIN REVIEWED-AGREE WITH RESULT DRT    Culture (A)  Final    METHICILLIN RESISTANT STAPHYLOCOCCUS AUREUS Sent to [facility] for further susceptibility testing. Performed at [facility], [address] [place], [place] [address]    Report Status PENDING  Incomplete   Organism ID, Bacteria METHICILLIN RESISTANT STAPHYLOCOCCUS AUREUS  Final      Susceptibility   Methicillin resistant staphylococcus aureus - MIC*    CIPROFLOXACIN >=8 RESISTANT Resistant     ERYTHROMYCIN >=8 RESISTANT Resistant     GENTAMICIN <=0.5 SENSITIVE Sensitive     OXACILLIN >=4 RESISTANT Resistant     TETRACYCLINE <=1 SENSITIVE Sensitive     VANCOMYCIN 1 SENSITIVE Sensitive     TRIMETH/SULFA <=10 SENSITIVE Sensitive     CLINDAMYCIN <=0.25 SENSITIVE Sensitive     RIFAMPIN <=0.5 SENSITIVE Sensitive     Inducible Clindamycin  NEGATIVE Sensitive     LINEZOLID 2 SENSITIVE Sensitive     * METHICILLIN RESISTANT STAPHYLOCOCCUS AUREUS  Blood Culture (routine x 2)     Status: Abnormal   Collection Time: 04/09/23  8:19 PM   Specimen: BLOOD RIGHT FOREARM  Result Value Ref Range Status   Specimen Description   Final    BLOOD RIGHT FOREARM Performed at [facility], [address], [place], [place] [address]    Special Requests   Final    BOTTLES DRAWN AEROBIC AND ANAEROBIC Blood Culture results may not be optimal due to an inadequate volume of blood received in culture bottles Performed at [facility], [address] [place], [place] [address]    Culture  Setup Time   Final    GRAM POSITIVE COCCI AEROBIC BOTTLE ONLY CRITICAL VALUE NOTED.  VALUE IS CONSISTENT WITH PREVIOUSLY REPORTED AND CALLED VALUE. GRAM STAIN REVIEWED-AGREE WITH RESULT DRT    Culture (A)  Final    STAPHYLOCOCCUS AUREUS SUSCEPTIBILITIES PERFORMED ON PREVIOUS CULTURE WITHIN THE LAST 5 DAYS. Performed at [facility], [address], [place], [place] [address]    Report Status 04/12/2023 FINAL  Final  Blood Culture ID Panel (Reflexed)     Status: Abnormal   Collection Time: 04/09/23  8:19 PM  Result Value Ref Range Status   Enterococcus faecalis NOT DETECTED NOT DETECTED Final   Enterococcus Faecium NOT DETECTED NOT DETECTED Final   Listeria monocytogenes NOT DETECTED NOT DETECTED Final   Staphylococcus species DETECTED (A) NOT DETECTED Final    Comment: CRITICAL RESULT CALLED TO, READ BACK BY AND VERIFIED WITH: Gloriann Loan PHARMD 1137 04/10/23 HNM    Staphylococcus aureus (BCID) DETECTED (A) NOT DETECTED Final    Comment: Methicillin (oxacillin)-resistant Staphylococcus aureus (MRSA). MRSA is predictably resistant to beta-lactam antibiotics (except ceftaroline). Preferred therapy is vancomycin unless clinically contraindicated. Patient requires contact precautions if  hospitalized. CRITICAL RESULT CALLED TO, READ  BACK BY AND VERIFIED WITH: Gloriann Loan PHARMD 1137 04/10/23 HNM    Staphylococcus epidermidis NOT DETECTED NOT DETECTED Final   Staphylococcus lugdunensis NOT DETECTED NOT DETECTED Final   Streptococcus species NOT DETECTED NOT DETECTED Final   Streptococcus agalactiae NOT DETECTED NOT DETECTED Final   Streptococcus pneumoniae NOT DETECTED NOT DETECTED Final   Streptococcus pyogenes NOT DETECTED NOT DETECTED Final   A.calcoaceticus-baumannii NOT DETECTED NOT  DETECTED Final   Bacteroides fragilis NOT DETECTED NOT DETECTED Final   Enterobacterales NOT DETECTED NOT DETECTED Final   Enterobacter cloacae complex NOT DETECTED NOT DETECTED Final   Escherichia coli NOT DETECTED NOT DETECTED Final   Klebsiella aerogenes NOT DETECTED NOT DETECTED Final   Klebsiella oxytoca NOT DETECTED NOT DETECTED Final   Klebsiella pneumoniae NOT DETECTED NOT DETECTED Final   Proteus species NOT DETECTED NOT DETECTED Final   Salmonella species NOT DETECTED NOT DETECTED Final   Serratia marcescens NOT DETECTED NOT DETECTED Final   Haemophilus influenzae NOT DETECTED NOT DETECTED Final   Neisseria meningitidis NOT DETECTED NOT DETECTED Final   Pseudomonas aeruginosa NOT DETECTED NOT DETECTED Final   Stenotrophomonas maltophilia NOT DETECTED NOT DETECTED Final   Candida albicans NOT DETECTED NOT DETECTED Final   Candida auris NOT DETECTED NOT DETECTED Final   Candida glabrata NOT DETECTED NOT DETECTED Final   Candida krusei NOT DETECTED NOT DETECTED Final   Candida parapsilosis NOT DETECTED NOT DETECTED Final   Candida tropicalis NOT DETECTED NOT DETECTED Final   Cryptococcus neoformans/gattii NOT DETECTED NOT DETECTED Final   Meth resistant mecA/C and MREJ DETECTED (A) NOT DETECTED Final    Comment: CRITICAL RESULT CALLED TO, READ BACK BY AND VERIFIED WITHGloriann Loan PHARMD 1137 04/10/23 HNM Performed at [facility], [address] [place], [place] [address]   Respiratory (~20 pathogens)  panel by PCR     Status: None   Collection Time: 04/10/23  1:27 AM   Specimen: Nasopharyngeal Swab; Respiratory  Result Value Ref Range Status   Adenovirus NOT DETECTED NOT DETECTED Final   Coronavirus 229E NOT DETECTED NOT DETECTED Final    Comment: (NOTE) The Coronavirus on the Respiratory Panel, DOES NOT test for the novel  Coronavirus (2019 nCoV)    Coronavirus HKU1 NOT DETECTED NOT DETECTED Final   Coronavirus NL63 NOT DETECTED NOT DETECTED Final   Coronavirus OC43 NOT DETECTED NOT DETECTED Final   Metapneumovirus NOT DETECTED NOT DETECTED Final   Rhinovirus / Enterovirus NOT DETECTED NOT DETECTED Final   Influenza A NOT DETECTED NOT DETECTED Final   Influenza B NOT DETECTED NOT DETECTED Final   Parainfluenza Virus 1 NOT DETECTED NOT DETECTED Final   Parainfluenza Virus 2 NOT DETECTED NOT DETECTED Final   Parainfluenza Virus 3 NOT DETECTED NOT DETECTED Final   Parainfluenza Virus 4 NOT DETECTED NOT DETECTED Final   Respiratory Syncytial Virus NOT DETECTED NOT DETECTED Final   Bordetella pertussis NOT DETECTED NOT DETECTED Final   Bordetella Parapertussis NOT DETECTED NOT DETECTED Final   Chlamydophila pneumoniae NOT DETECTED NOT DETECTED Final   Mycoplasma pneumoniae NOT DETECTED NOT DETECTED Final    Comment: Performed at [facility], [address], [place], [place] [address]  Urine Culture     Status: None   Collection Time: 04/10/23  1:27 AM   Specimen: Urine, Random  Result Value Ref Range Status   Specimen Description   Final    URINE, RANDOM Performed at [facility], [address], [place], [place] [address]    Special Requests   Final    NONE Reflexed from [id number] Performed at [facility], [address], [place], [place] [address]    Culture   Final    NO GROWTH Performed at [facility], [address] [place], [place] [address]    Report Status 04/11/2023 FINAL  Final  C Difficile Quick Screen w PCR reflex  Status:  None   Collection Time: 04/10/23  2:00 PM   Specimen: STOOL  Result Value Ref Range Status   C Diff antigen NEGATIVE NEGATIVE Final   C Diff toxin NEGATIVE NEGATIVE Final   C Diff interpretation No C. difficile detected.  Final    Comment: Performed at [facility], [address] [place], [place] [address]  MRSA Next Gen by PCR, Nasal     Status: Abnormal   Collection Time: 04/10/23  7:18 PM   Specimen: Nasal Mucosa; Nasal Swab  Result Value Ref Range Status   MRSA by PCR Next Gen DETECTED (A) NOT DETECTED Final    Comment: RESULT CALLED TO, READ BACK BY AND VERIFIED WITH:  JACOB MOORE AT 2346 04/10/23 JG (NOTE) The GeneXpert MRSA Assay (FDA approved for NASAL specimens only), is one component of a comprehensive MRSA colonization surveillance program. It is not intended to diagnose MRSA infection nor to guide or monitor treatment for MRSA infections. Test performance is not FDA approved in patients less than 10 years old. Performed at [facility], [address] [address] [place], [place] [address]   Culture, blood (Routine X 2) w Reflex to ID Panel     Status: None   Collection Time: 04/12/23  5:00 AM   Specimen: BLOOD  Result Value Ref Range Status   Specimen Description BLOOD UNKNOWN  Final   Special Requests   Final    BOTTLES DRAWN AEROBIC AND ANAEROBIC Blood Culture adequate volume   Culture   Final    NO GROWTH 5 DAYS Performed at [facility], [address], [place], [place] [address]    Report Status 04/17/2023 FINAL  Final  MIC (1 Drug)-blood culture; 04/09/2023; BLOOD RIGHT ARM; MRSA; Daptomycin     Status: Abnormal   Collection Time: 04/12/23  9:40 AM   Specimen: BLOOD RIGHT ARM  Result Value Ref Range Status   Min Inhibitory Conc (1 Drug) Preliminary report (A)  Final    Comment: (NOTE) Performed At: [facility] [address] [place], [place] [id number] Jolene Schimke MD LK:[id number]    Source [phone]  Final    Comment:  Performed at [facility], [address], [place], [place] [address]  Culture, blood (Routine X 2) w Reflex to ID Panel     Status: None   Collection Time: 04/12/23  1:35 PM   Specimen: BLOOD  Result Value Ref Range Status   Specimen Description BLOOD BLOOD LEFT FOREARM  Final   Special Requests   Final    BOTTLES DRAWN AEROBIC AND ANAEROBIC Blood Culture adequate volume   Culture   Final    NO GROWTH 5 DAYS Performed at [facility], [address], [place], [place] [address]    Report Status 04/17/2023 FINAL  Final  Cath Tip Culture     Status: Abnormal   Collection Time: 04/14/23 12:00 PM   Specimen: Catheter Tip; Other  Result Value Ref Range Status   Specimen Description   Final    CATH TIP Performed at [facility], [address] [place], [place] [address]    Special Requests   Final    NONE Performed at [facility], [address] [place], [place] [address]    Culture (A)  Final    >=100,000 COLONIES/mL METHICILLIN RESISTANT STAPHYLOCOCCUS AUREUS   Report Status 04/17/2023 FINAL  Final   Organism ID, Bacteria METHICILLIN RESISTANT STAPHYLOCOCCUS AUREUS (A)  Final  Susceptibility   Methicillin resistant staphylococcus aureus - MIC*    CIPROFLOXACIN >=8 RESISTANT Resistant     ERYTHROMYCIN >=8 RESISTANT Resistant     GENTAMICIN <=0.5 SENSITIVE Sensitive     OXACILLIN >=4 RESISTANT Resistant     TETRACYCLINE <=1 SENSITIVE Sensitive     VANCOMYCIN 1 SENSITIVE Sensitive     TRIMETH/SULFA <=10 SENSITIVE Sensitive     CLINDAMYCIN <=0.25 SENSITIVE Sensitive     RIFAMPIN <=0.5 SENSITIVE Sensitive     Inducible Clindamycin NEGATIVE Sensitive     LINEZOLID 2 SENSITIVE Sensitive     * >=100,000 COLONIES/mL METHICILLIN RESISTANT STAPHYLOCOCCUS AUREUS  Culture, blood (Routine X 2) w Reflex to ID Panel     Status: None (Preliminary result)   Collection Time: 04/15/23 10:05 PM   Specimen: BLOOD  Result Value Ref Range Status    Specimen Description BLOOD BLOOD RIGHT ARM  Final   Special Requests   Final    BOTTLES DRAWN AEROBIC AND ANAEROBIC Blood Culture adequate volume   Culture   Final    NO GROWTH 4 DAYS Performed at [name], [address], [place], [place] [address]    Report Status PENDING  Incomplete  Culture, blood (Routine X 2) w Reflex to ID Panel     Status: None (Preliminary result)   Collection Time: 04/15/23 10:05 PM   Specimen: BLOOD  Result Value Ref Range Status   Specimen Description BLOOD BLOOD LEFT ARM  Final   Special Requests   Final    BOTTLES DRAWN AEROBIC AND ANAEROBIC Blood Culture adequate volume   Culture   Final    NO GROWTH 4 DAYS Performed at [facility], [address], [place], [place] [address]    Report Status PENDING  Incomplete    Procedures and diagnostic studies:  No results found.             LOS: 9 days   Chyrl Elwell  [employer] loss adjuster on [email or web address]. If 7PM-7AM, please contact night-coverage at [email or web address]     04/19/2023, 11:16 AM

## 2023-04-19 NOTE — Progress Notes (Signed)
Pharmacy Antibiotic Note  [name] is a 68 y.o. male admitted on 04/09/2023 with MRSA bacteremia.  Pharmacy has been consulted for Vancomycin dosing.  Patient with recent admission for recurrent DVT, discharged 1/31.  Comes to ED 2/4 with confusion and shortness of breath.  CT chest/abdomen/pelvis lung findings concerning for septic pulmonary emboli.  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 ID consulted.   Today, 04/19/2023 Day #10 vancomycin Renal: AKI - resolved, SCr 0.65 mg/dl [id number] WBC WNL 2/4 blood culture: 3/4 bottles MRSA 2/5 Urine cx: NG Respiratory panel and respiratory virus panel neg 2/5 C difficile neg Rule out endocarditis with concern for septic pulmonary embolism on CT 2/5 ECHO: no endocarditis 2/7 repeat blood cultures: NG<24 hrs 2/9 Port removed - tip culture growing MRSA 2/10 repeat blood cultures: NGTD 2/11 TEE no endocarditis  Vancomycin dosing/levels: Vancomycin doses given 2/4 1gm IV x 1 at 2356 then 2/5 1500mg  IV  x1 2/6 random vancomycin level at 0603 = 11 mcg/mL 2/6 vancomycin 1500mg  IV x 1 given at 0947, 2/7 random vancomycin level 0553 = 16 mcg/mL 2/7 Vancomycin 1500 mg given 2/7@1046  2/8 vancomycin 1500 mg given 2/8 @0836  2/9 random vancomycin level @0502  = 23 mcg/ml,  no dose ordered 2/10 random vancomycin 12. Last dose 2/8 @0836  (1500mg  ) 2/11 random vancomycin  = 11 MCG/Ml AT 06:03, previous dose vanco 1500mg  given 2/10 at 11:00 Vancomycin dose 1500mg  IV q24h, dose given at 2/12 at 10:04 over 3hr infusion (level with 3rd dose of 1500mg  q24h) Vancomycin peak = 32 mcg/mL 2/12 at 14:20 Vancomycin randome = 11 mcg/ml 2/13 at 06:42 AUC = 464, Cmax 34.8 mcg/ml and Cmin 8.8 mcg/mL  Plan: Based on 2/12 and 2/13 vancomycin levels, adjust vancomycin to 750mg  IV q12h. Infuse each dose over 2h  AUC  = 463, Cmax = 26.1 mcg/mL and Cmin 13.6 mcg/mL  Note: history of infusion related reaction with vancomycin - slow infusion Anticipate  some difficulty dosing vancomycin with PMH Bilateral AKA - effect on volume of distribution and SCr (loss of muscle mass).   Follow renal function closely daily CMP ordered until 2/15 Plan to check vancomycin levels once at steady state on adjusted dose ID planning vancomycin until 04/28/23 then complete two more weeks of antibiotic therapy with linezolid 600mg  PO BID (estimated copay $68)  Daptomycin- not currently option with septic PE, Pneumonia  Temp (24hrs), Avg:98.2 F (36.8 C), Min:97.9 F (36.6 C), Max:98.6 F (37 C)   Recent Labs  Lab 04/16/23 0211 04/16/23 0603 04/17/23 0422 04/17/23 1420 04/18/23 0642 04/19/23 0650  WBC 10.4 9.8 9.0  --  9.0 9.8  CREATININE 1.00 0.92 0.78  --  0.72 0.65  VANCOPEAK  --   --   --  32  --   --   VANCORANDOM  --  11  --   --  11  --     Estimated Creatinine Clearance: 103.4 mL/min (by C-G formula based on SCr of 0.65 mg/dL).    Allergies  Allergen Reactions   Bacitracin Hives   Vancomycin Other (See Comments) and Rash    Red man syndrome.   Ketorolac     Other Reaction(s): Kidney Disorder  Other reaction(s): Kidney Disorder   Spironolactone     Other Reaction(s): Unknown  Other reaction(s): Unknown    Antimicrobials this admission: 2/4 Cefepime >> x 1  dose 2/4 Vancomycin >>    Thank you for allowing pharmacy to be a part of this patient's care.  Juliette Alcide, PharmD, BCPS, BCIDP Work Cell: [phone] 04/19/2023 9:39 AM

## 2023-04-19 NOTE — Progress Notes (Signed)
Date of Admission:  04/09/2023   T ID: [name] is a 68 y.o. male  Principal Problem:   HAP (hospital-acquired pneumonia) Active Problems:   Prostate cancer metastatic to bone [facility])   PE (pulmonary thromboembolism) (HCC)   Essential hypertension   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   Ogilvie syndrome   Severe sepsis (HCC)   AKI (acute kidney injury) (HCC)   MRSA bacteremia   Pulmonary infiltrates   Septic embolism (HCC)   Palliative care encounter   Acute respiratory failure with hypoxia (HCC)   Morbid obesity (HCC)    Subjective: Pt is feeling much better Doing resistant band exercise in bed  Medications:   acetaminophen  650 mg Oral Q6H   buPROPion  100 mg Oral Daily   Followed by   Melene Muller ON 04/22/2023] buPROPion  75 mg Oral Daily   Followed by   Melene Muller ON 04/25/2023] buPROPion  50 mg Oral Daily   Chlorhexidine Gluconate Cloth  6 each Topical Daily   dicyclomine  10 mg Oral TID AC & HS   Fe Fum-Vit C-Vit B12-FA  1 capsule Oral QPC breakfast   furosemide  20 mg Oral Daily   gabapentin  200 mg Oral TID   lactulose  10 g Oral TID   leptospermum manuka honey  1 Application Topical Daily   lidocaine  1 patch Transdermal Q24H   nystatin cream   Topical BID   polyethylene glycol  17 g Oral BID   potassium chloride  40 mEq Oral BID   predniSONE  20 mg Oral Q breakfast   Followed by   Melene Muller ON 04/21/2023] predniSONE  10 mg Oral Q breakfast   warfarin  3 mg Oral ONCE-1600   Warfarin - Pharmacist Dosing Inpatient   Does not apply q1600    Objective: Vital signs in last 24 hours: Patient Vitals for the past 24 hrs:  BP Temp Temp src Pulse Resp SpO2  04/19/23 1239 108/65 98 F (36.7 C) -- 66 18 97 %  04/19/23 0816 116/61 98.6 F (37 C) -- 62 18 97 %  04/19/23 0045 129/70 98.5 F (36.9 C) Oral 62 17 96 %  04/18/23 2212 116/63 98 F (36.7 C) -- 61 20 98 %  04/18/23 1553 120/71 97.9 F (36.6 C) -- 62 -- 97 %      PHYSICAL EXAM:   General:awake and alert, no resp distress Previous Port site removed covered with dressing Lungs: Bilateral air entry Heart: S1-S2. Abdomen: Soft, non-tender,not distended. Bowel sounds normal. No masses Extremities: Bilateral AKA  skin: No rashes or lesions. Or bruising Lymph: Cervical, supraclavicular normal. Neurologic: Grossly non-focal  Lab Results    Latest Ref Rng & Units 04/19/2023    6:50 AM 04/18/2023    6:42 AM 04/17/2023    4:22 AM  CBC  WBC 4.0 - 10.5 K/uL 9.8  9.0  9.0   Hemoglobin 13.0 - 17.0 g/dL 9.7  16.1  09.6   Hematocrit 39.0 - 52.0 % 29.7  31.4  32.1   Platelets 150 - 400 K/uL 271  319  356        Latest Ref Rng & Units 04/19/2023    6:50 AM 04/18/2023    6:42 AM 04/17/2023    4:22 AM  CMP  Glucose 70 - 99 mg/dL 90  91  95   BUN 8 - 23 mg/dL 16  21  31    Creatinine 0.61 - 1.24 mg/dL 0.45  0.72  0.78   Sodium 135 - 145 mmol/L 139  140  138   Potassium 3.5 - 5.1 mmol/L 3.3  3.1  2.7   Chloride 98 - 111 mmol/L 108  107  106   CO2 22 - 32 mmol/L 25  26  24    Calcium 8.9 - 10.3 mg/dL 9.1  9.3  9.2   Total Protein 6.5 - 8.1 g/dL 6.0  5.8  5.9   Total Bilirubin 0.0 - 1.2 mg/dL 0.5  0.6  0.7   Alkaline Phos 38 - 126 U/L 58  58  59   AST 15 - 41 U/L 29  24  30    ALT 0 - 44 U/L 36  30  35       Microbiology: [facility] MRSA on 04/09/23 both sets 2/7 BC- NG So far 2/9 cath tip  MRSA       Assessment/Plan:  MRSA bacteremia With port in place and multiple nodules in the lungs concern for septic emboli and endocarditis of tricuspid valve  repeat blood culture from 2/7 NG PORT  removed  04/14/23 and culture is MRSA TEE neg for endocarditis  On vancomycin adjusted to crcl Watch closely because of AKI Daptomycin is currently not an option because of  lung infiltrate Will need 4 weeks of antibiotic, after 2 weeks IV vanco 04/28/23 can switch to PO linezolid 600mg  PO BID for 2 more week 05/12/23 Wellbutrin to be tapered and stopped  because of risk of serotonin  syndrome with linezolid- Though It is a lower risk ffor serotonin syndrome compared to  SSRI /SNRI but he is on high dose and a long acting form. 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

## 2023-04-20 ENCOUNTER — Encounter: Payer: Self-pay | Admitting: Internal Medicine

## 2023-04-20 ENCOUNTER — Inpatient Hospital Stay: Payer: Medicare PPO

## 2023-04-20 DIAGNOSIS — R7881 Bacteremia: Secondary | ICD-10-CM | POA: Diagnosis not present

## 2023-04-20 DIAGNOSIS — B9562 Methicillin resistant Staphylococcus aureus infection as the cause of diseases classified elsewhere: Secondary | ICD-10-CM | POA: Diagnosis not present

## 2023-04-20 LAB — COMPREHENSIVE METABOLIC PANEL
ALT: 44 U/L (ref 0–44)
AST: 40 U/L (ref 15–41)
Albumin: 2.5 g/dL — ABNORMAL LOW (ref 3.5–5.0)
Alkaline Phosphatase: 61 U/L (ref 38–126)
Anion gap: 6 (ref 5–15)
BUN: 16 mg/dL (ref 8–23)
CO2: 24 mmol/L (ref 22–32)
Calcium: 9.2 mg/dL (ref 8.9–10.3)
Chloride: 111 mmol/L (ref 98–111)
Creatinine, Ser: 0.64 mg/dL (ref 0.61–1.24)
GFR, Estimated: >60 mL/min (ref >60)
Glucose, Bld: 111 mg/dL — ABNORMAL HIGH (ref 70–99)
Potassium: 3.5 mmol/L (ref 3.5–5.1)
Sodium: 141 mmol/L (ref 135–145)
Total Bilirubin: 0.6 mg/dL (ref 0.0–1.2)
Total Protein: 5.8 g/dL — ABNORMAL LOW (ref 6.5–8.1)

## 2023-04-20 LAB — CBC WITH DIFFERENTIAL/PLATELET
Abs Immature Granulocytes: 0.14 10*3/uL — ABNORMAL HIGH (ref 0.00–0.07)
Basophils Absolute: 0 10*3/uL (ref 0.0–0.1)
Basophils Relative: 0 %
Eosinophils Absolute: 0.1 10*3/uL (ref 0.0–0.5)
Eosinophils Relative: 1 %
HCT: 29.2 % — ABNORMAL LOW (ref 39.0–52.0)
Hemoglobin: 9.5 g/dL — ABNORMAL LOW (ref 13.0–17.0)
Immature Granulocytes: 1 %
Lymphocytes Relative: 7 %
Lymphs Abs: 0.7 10*3/uL (ref 0.7–4.0)
MCH: 30.4 pg (ref 26.0–34.0)
MCHC: 32.5 g/dL (ref 30.0–36.0)
MCV: 93.6 fL (ref 80.0–100.0)
Monocytes Absolute: 0.7 10*3/uL (ref 0.1–1.0)
Monocytes Relative: 7 %
Neutro Abs: 8.6 10*3/uL — ABNORMAL HIGH (ref 1.7–7.7)
Neutrophils Relative %: 84 %
Platelets: 240 10*3/uL (ref 150–400)
RBC: 3.12 MIL/uL — ABNORMAL LOW (ref 4.22–5.81)
RDW: 14.2 % (ref 11.5–15.5)
WBC: 10.3 10*3/uL (ref 4.0–10.5)
nRBC: 0 % (ref 0.0–0.2)

## 2023-04-20 LAB — CULTURE, BLOOD (ROUTINE X 2)
Culture: NO GROWTH
Culture: NO GROWTH
Special Requests: ADEQUATE
Special Requests: ADEQUATE

## 2023-04-20 LAB — PROTIME-INR
INR: 3.1 — ABNORMAL HIGH (ref 0.8–1.2)
Prothrombin Time: 31.9 s — ABNORMAL HIGH (ref 11.4–15.2)

## 2023-04-20 LAB — MAGNESIUM: Magnesium: 1.7 mg/dL (ref 1.7–2.4)

## 2023-04-20 LAB — PHOSPHORUS: Phosphorus: 2.8 mg/dL (ref 2.5–4.6)

## 2023-04-20 MED ORDER — WARFARIN SODIUM 2.5 MG PO TABS
2.5000 mg | ORAL_TABLET | Freq: Once | ORAL | Status: AC
  Administered 2023-04-20: 2.5 mg via ORAL
  Filled 2023-04-20: qty 1

## 2023-04-20 NOTE — Progress Notes (Addendum)
Progress Note    [name]  OZH:[id number] DOB: Nov 02, 1955  DOA: 04/09/2023 PCP: Dione Housekeeper, MD      Brief Narrative:    Medical records reviewed and are as summarized below:  [name] is a 68 y.o. male  stage IV prostate cancer, recurrent VTE, hypertension, PVD status post AKA, decubitus ulcers, who was admitted on 1/18 with shortness of breath and was found to have recurrent DVT despite Eliquis and then Coumadin (not technically a failure as he was not bridged.)  He had been unable to afford Lovenox as an outpatient.  During hospitalization he was noted to have volume overload and was diuresed.  Stay was also complicated by ileus for stool impaction that was treated aggressively with a bowel regimen.  He was discharged in stable condition on 1/31.  He returned to the ED on 2/4 due to increasing confusion and shortness of breath.  He was found to be saturating 89% on room air.  EMS placed patient on CPAP prior to arrival.  In the ED he was found to be septic with fever of 101.9, respiratory rate 31, leukocytosis 20K, and hypoxia.  Chest x-ray revealed left basilar opacity.  He was placed on BiPAP due to work of breathing and hypoxemia.  He failed weaning trial due to work of breathing though he did have good oxygen saturations.  He was admitted for management of healthcare associated pneumonia and sepsis. Hospital stay has been complex.  CT revealed multiple septic emboli in the lungs.  Port removal was attempted early on as this was suspected nidus of infection, however his INR was supratherapeutic which prevented port removal.  He did receive low-dose vitamin K on 2/7 and ultimately was able to have port removed on 2/9.  The tip culture was positive for MRSA infection.  He underwent TEE on 2/11.  He had further respiratory compromise throughout admission and was on prolonged BiPAP.  Further respiratory compromise appeared to be secondary to acute CHF  exacerbation.  He was treated with IV diuresis and had more than 15 L net fluid loss since admission.  Respiratory failure has improved and he has been weaned off of oxygen.   2/12: Patient currently stable, does not want to go to SNF, asking to complete IV antibiotics and then go home, stating that he had everything in place at home to help.  TEE was negative, per ID will need 4 weeks of antibiotics.   2/13: Continue to improve.  Will complete IV vancomycin seen on 2/23 before leaving home with home health.  Patient will need 2 more weeks of linezolid afterwards.  ID is tapering home Wellbutrin so he can get linezolid.   Chart reviewed   Assessment/Plan:   Principal Problem:   MRSA bacteremia Active Problems:   HAP (hospital-acquired pneumonia)   Severe sepsis (HCC)   PE (pulmonary thromboembolism) (HCC)   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   Ogilvie syndrome   AKI (acute kidney injury) (HCC)   Prostate cancer metastatic to bone [facility])   Essential hypertension   Pulmonary infiltrates   Septic embolism (HCC)   Palliative care encounter   Acute respiratory failure with hypoxia (HCC)   Morbid obesity (HCC)     Body mass index is 62.94 kg/m.  (Morbid obesity)    Severe sepsis, MRSA bacteremia, pneumonia, septic pulmonary emboli: Continue IV vancomycin through 04/28/2023.  This will be followed by 2 weeks of oral linezolid. Wellbutrin is  being tapered off to allow linezolid use in the near future. Port-A-Cath was removed on 04/14/2023 and tip culture was positive for MRSA. TEE was negative for vegetation   Acute on chronic diastolic CHF: S/p treatment with IV Lasix.  Continue oral Lasix.   Pulmonary embolism, history of recurrent venous thromboembolism while on Eliquis: Continue warfarin and monitor INR.   Acute urinary retention: Resolved.  Foley catheter was successfully removed on 04/19/2023.    Hypokalemia: Improved   Stage IV prostate cancer with  metastasis to the bones: Analgesics as needed for pain.  Follow-up with oncologist as an outpatient.   Bilateral upper extremity weakness: Improving. This is likely from deconditioning. Continue PT and OT as able.     Acute hypoxic respiratory failure, acute kidney injury, acute metabolic encephalopathy: Resolved Prednisone taper will be completed on 04/22/2023   Bilateral groin pain:? from metastatic disease.  X-ray pelvis and bilateral hips has been ordered.   Diarrhea: Hold lactulose and MiraLAX.   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   Diet Order             Diet regular Fluid consistency: Thin  Diet effective now                            Consultants: Cardiologist ID specialist Palliative care   Procedures: TEE on 04/16/2023 (no vegetations noted) Port-A-Cath removal by IR on 04/14/2023    Medications:    acetaminophen  650 mg Oral Q6H   buPROPion  100 mg Oral Daily   Followed by   Melene Muller ON 04/22/2023] buPROPion  75 mg Oral Daily   Followed by   Melene Muller ON 04/25/2023] buPROPion  50 mg Oral Daily   Chlorhexidine Gluconate Cloth  6 each Topical Daily   dicyclomine  10 mg Oral TID AC & HS   Fe Fum-Vit C-Vit B12-FA  1 capsule Oral QPC breakfast   furosemide  20 mg Oral Daily   gabapentin  200 mg Oral TID   lactulose  10 g Oral TID   leptospermum manuka honey  1 Application Topical Daily   lidocaine  1 patch Transdermal Q24H   nystatin cream   Topical BID   polyethylene glycol  17 g Oral BID   [START ON 04/21/2023] predniSONE  10 mg Oral Q breakfast   Warfarin - Pharmacist Dosing Inpatient   Does not apply q1600   Continuous Infusions:  vancomycin 750 mg (04/20/23 1052)     Anti-infectives  (From admission, onward)    Start     Dose/Rate Route Frequency Ordered Stop   04/18/23 0900  vancomycin (VANCOREADY) IVPB 750 mg/150 mL        750 mg 75 mL/hr over 120 Minutes Intravenous Every 12 hours 04/18/23 0818     04/15/23 0800  vancomycin (VANCOREADY) IVPB 1500 mg/300 mL  Status:  Discontinued        1,500 mg 100 mL/hr over 180 Minutes Intravenous Every 24 hours 04/15/23 0649 04/18/23 0818   04/13/23 1030  vancomycin variable dose per unstable renal function (pharmacist dosing)  Status:  Discontinued         Does not apply See admin instructions 04/13/23 1031 04/15/23 0711   04/12/23 1000  vancomycin (VANCOREADY) IVPB 1500 mg/300 mL  Status:  Discontinued        1,500 mg 100 mL/hr over 180 Minutes Intravenous Every 24 hours 04/12/23 0831 04/13/23 1031   04/11/23 1000  vancomycin (VANCOREADY) IVPB 1500 mg/300 mL        1,500 mg 75 mL/hr over 240 Minutes Intravenous  Once 04/11/23 0828 04/11/23 1358   04/10/23 0308  vancomycin variable dose per unstable renal function (pharmacist dosing)  Status:  Discontinued         Does not apply See admin instructions 04/10/23 0308 04/12/23 0831   04/10/23 0145  vancomycin (VANCOREADY) IVPB 1500 mg/300 mL        1,500 mg 100 mL/hr over 180 Minutes Intravenous  Once 04/10/23 0131 04/10/23 0546   04/10/23 0130  vancomycin (VANCOCIN) IVPB 1000 mg/200 mL premix  Status:  Discontinued        1,000 mg 200 mL/hr over 60 Minutes Intravenous  Once 04/10/23 0120 04/10/23 0131   04/09/23 2100  vancomycin (VANCOCIN) IVPB 1000 mg/200 mL premix        1,000 mg 200 mL/hr over 60 Minutes Intravenous  Once 04/09/23 2047 04/10/23 0113   04/09/23 2100  ceFEPIme (MAXIPIME) 2 g in sodium chloride 0.9 % 100 mL IVPB        2 g 200 mL/hr over 30 Minutes Intravenous  Once 04/09/23 2047 04/10/23 0113              Family Communication/Anticipated D/C date and plan/Code Status   DVT prophylaxis:      Code Status: Limited: Do not attempt resuscitation  (DNR) -DNR-LIMITED -Do Not Intubate/DNI   Family Communication: None Disposition Plan: Plan to discharge home   Status is: Inpatient Remains inpatient appropriate because: IV antibiotics for bacteremia       Subjective:   Interval events noted.  He complains of severe bilateral groin pain from moving around in the bed.  He also complains of diarrhea.  Objective:    Vitals:   04/19/23 2050 04/20/23 0000 04/20/23 0353 04/20/23 0831  BP: 133/65 130/60 119/65 136/85  Pulse: 62 64 (!) 58 (!) 59  Resp:      Temp: 98.6 F (37 C) 98.5 F (36.9 C) 98.4 F (36.9 C) 97.6 F (36.4 C)  TempSrc: Oral Oral Oral Oral  SpO2: 99% 95% 98%   Weight:      Height:       No data found.   Intake/Output Summary (Last 24 hours) at 04/20/2023 1132 Last data filed at 04/20/2023 1000 Gross per 24 hour  Intake 600 ml  Output 900 ml  Net -300 ml   Filed Weights   04/09/23 2019 04/19/23 1555  Weight: 136 kg (!) 136.6 kg    Exam:  GEN: NAD SKIN: Warm and dry EYES: No pallor or icterus ENT: MMM CV: RRR PULM: CTA B ABD: soft, obese, NT, +BS CNS: AAO x 3, non focal EXT: Bilateral AKA         Data Reviewed:   I have personally reviewed following labs and imaging studies:  Labs: Labs show the following:   Basic Metabolic Panel: Recent Labs  Lab 04/16/23 0603 04/17/23 0422 04/18/23 0642 04/19/23 0650 04/20/23 0451  NA 138 138 140 139 141  K 3.6 2.7* 3.1* 3.3* 3.5  CL 103 106 107 108 111  CO2 25 24 26 25 24   GLUCOSE 126* 95 91 90 111*  BUN 38* 31* 21 16 16   CREATININE 0.92 0.78 0.72  0.65 0.64  CALCIUM 9.6 9.2 9.3 9.1 9.2  MG 1.8 1.6* 1.9 1.7 1.7  PHOS 3.3 2.6 2.9 2.8 2.8   GFR Estimated Creatinine Clearance: 103.8 mL/min (by C-G formula based on SCr of 0.64 mg/dL). Liver Function Tests: Recent Labs  Lab 04/16/23 0603 04/17/23 0422 04/18/23 0642 04/19/23 0650 04/20/23 0451  AST 35 30 24 29  40  ALT 36 35 30 36 44  ALKPHOS 65 59 58 58 61  BILITOT 0.5  0.7 0.6 0.5 0.6  PROT 6.8 5.9* 5.8* 6.0* 5.8*  ALBUMIN 2.8* 2.7* 2.6* 2.6* 2.5*   No results for input(s): "LIPASE", "AMYLASE" in the last 168 hours. No results for input(s): "AMMONIA" in the last 168 hours. Coagulation profile Recent Labs  Lab 04/16/23 0211 04/17/23 0422 04/18/23 0642 04/19/23 0650 04/20/23 0451  INR 1.9* 3.0* 3.4* 3.0* 3.1*    CBC: Recent Labs  Lab 04/16/23 0603 04/17/23 0422 04/18/23 0642 04/19/23 0650 04/20/23 0451  WBC 9.8 9.0 9.0 9.8 10.3  NEUTROABS 8.3* 7.0 7.0 8.1* 8.6*  HGB 11.5* 10.8* 10.1* 9.7* 9.5*  HCT 34.2* 32.1* 31.4* 29.7* 29.2*  MCV 91.0 91.7 94.3 93.4 93.6  PLT 403* 356 319 271 240   Cardiac Enzymes: No results for input(s): "CKTOTAL", "CKMB", "CKMBINDEX", "TROPONINI" in the last 168 hours. BNP (last 3 results) No results for input(s): "PROBNP" in the last 8760 hours. CBG: Recent Labs  Lab 04/19/23 0815 04/19/23 1240  GLUCAP 74 118*   D-Dimer: No results for input(s): "DDIMER" in the last 72 hours. Hgb A1c: No results for input(s): "HGBA1C" in the last 72 hours. Lipid Profile: No results for input(s): "CHOL", "HDL", "LDLCALC", "TRIG", "CHOLHDL", "LDLDIRECT" in the last 72 hours. Thyroid function studies: No results for input(s): "TSH", "T4TOTAL", "T3FREE", "THYROIDAB" in the last 72 hours.  Invalid input(s): "FREET3" Anemia work up: No results for input(s): "VITAMINB12", "FOLATE", "FERRITIN", "TIBC", "IRON", "RETICCTPCT" in the last 72 hours. Sepsis Labs: Recent Labs  Lab 04/17/23 0422 04/18/23 0642 04/19/23 0650 04/20/23 0451  WBC 9.0 9.0 9.8 10.3    Microbiology Recent Results (from the past 240 hours)  C Difficile Quick Screen w PCR reflex     Status: None   Collection Time: 04/10/23  2:00 PM   Specimen: STOOL  Result Value Ref Range Status   C Diff antigen NEGATIVE NEGATIVE Final   C Diff toxin NEGATIVE NEGATIVE Final   C Diff interpretation No C. difficile detected.  Final    Comment: Performed at  [facility], [address] [place], [place] [address]  MRSA Next Gen by PCR, Nasal     Status: Abnormal   Collection Time: 04/10/23  7:18 PM   Specimen: Nasal Mucosa; Nasal Swab  Result Value Ref Range Status   MRSA by PCR Next Gen DETECTED (A) NOT DETECTED Final    Comment: RESULT CALLED TO, READ BACK BY AND VERIFIED WITH:  JACOB MOORE AT 2346 04/10/23 JG (NOTE) The GeneXpert MRSA Assay (FDA approved for NASAL specimens only), is one component of a comprehensive MRSA colonization surveillance program. It is not intended to diagnose MRSA infection nor to guide or monitor treatment for MRSA infections. Test performance is not FDA approved in patients less than 51 years old. Performed at [facility], [address] [place], [place] [address]   Culture, blood (Routine X 2) w Reflex to ID Panel     Status: None   Collection Time: 04/12/23  5:00 AM   Specimen: BLOOD  Result Value Ref  Range Status   Specimen Description BLOOD UNKNOWN  Final   Special Requests   Final    BOTTLES DRAWN AEROBIC AND ANAEROBIC Blood Culture adequate volume   Culture   Final    NO GROWTH 5 DAYS Performed at [facility], [address] [place], [place] [address]    Report Status 04/17/2023 FINAL  Final  MIC (1 Drug)-blood culture; 04/09/2023; BLOOD RIGHT ARM; MRSA; Daptomycin     Status: Abnormal   Collection Time: 04/12/23  9:40 AM   Specimen: BLOOD RIGHT ARM  Result Value Ref Range Status   Min Inhibitory Conc (1 Drug) Preliminary report (A)  Final    Comment: (NOTE) Performed At: [facility] [address] [place], [place] [id number] Jolene Schimke MD HY:[id number]    Source [phone]  Final    Comment: Performed at [facility], [address] [place], [place] [address]  Culture, blood (Routine X 2) w Reflex to ID Panel     Status: None   Collection Time: 04/12/23  1:35 PM   Specimen: BLOOD  Result Value Ref Range Status   Specimen Description BLOOD  BLOOD LEFT FOREARM  Final   Special Requests   Final    BOTTLES DRAWN AEROBIC AND ANAEROBIC Blood Culture adequate volume   Culture   Final    NO GROWTH 5 DAYS Performed at [facility], [address] [place], [place] [address]    Report Status 04/17/2023 FINAL  Final  Cath Tip Culture     Status: Abnormal   Collection Time: 04/14/23 12:00 PM   Specimen: Catheter Tip; Other  Result Value Ref Range Status   Specimen Description   Final    CATH TIP Performed at [facility], [address] [place], [place] [address]    Special Requests   Final    NONE Performed at [facility], [address] [place], [place] [address]    Culture (A)  Final    >=100,000 COLONIES/mL METHICILLIN RESISTANT STAPHYLOCOCCUS AUREUS   Report Status 04/17/2023 FINAL  Final   Organism ID, Bacteria METHICILLIN RESISTANT STAPHYLOCOCCUS AUREUS (A)  Final      Susceptibility   Methicillin resistant staphylococcus aureus - MIC*    CIPROFLOXACIN >=8 RESISTANT Resistant     ERYTHROMYCIN >=8 RESISTANT Resistant     GENTAMICIN <=0.5 SENSITIVE Sensitive     OXACILLIN >=4 RESISTANT Resistant     TETRACYCLINE <=1 SENSITIVE Sensitive     VANCOMYCIN 1 SENSITIVE Sensitive     TRIMETH/SULFA <=10 SENSITIVE Sensitive     CLINDAMYCIN <=0.25 SENSITIVE Sensitive     RIFAMPIN <=0.5 SENSITIVE Sensitive     Inducible Clindamycin NEGATIVE Sensitive     LINEZOLID 2 SENSITIVE Sensitive     * >=100,000 COLONIES/mL METHICILLIN RESISTANT STAPHYLOCOCCUS AUREUS  Culture, blood (Routine X 2) w Reflex to ID Panel     Status: None   Collection Time: 04/15/23 10:05 PM   Specimen: BLOOD  Result Value Ref Range Status   Specimen Description BLOOD BLOOD RIGHT ARM  Final   Special Requests   Final    BOTTLES DRAWN AEROBIC AND ANAEROBIC Blood Culture adequate volume   Culture   Final    NO GROWTH 5 DAYS Performed at [facility], [address], [place], [place] [address]    Report Status  04/20/2023 FINAL  Final  Culture, blood (Routine X 2) w Reflex to ID Panel     Status: None   Collection Time: 04/15/23  10:05 PM   Specimen: BLOOD  Result Value Ref Range Status   Specimen Description BLOOD BLOOD LEFT ARM  Final   Special Requests   Final    BOTTLES DRAWN AEROBIC AND ANAEROBIC Blood Culture adequate volume   Culture   Final    NO GROWTH 5 DAYS Performed at [facility], [address], [place], [place] [address]    Report Status 04/20/2023 FINAL  Final    Procedures and diagnostic studies:  No results found.             LOS: 10 days   Phillipe Clemon  [facility]   Pager on [email or web address]. If 7PM-7AM, please contact night-coverage at [email or web address]     04/20/2023, 11:32 AM

## 2023-04-20 NOTE — Consult Note (Signed)
PHARMACY - ANTICOAGULATION CONSULT NOTE  Pharmacy Consult for Warfarin Indication: hx recurrent VTE  Patient Measurements: Height: 4\' 10"  (147.3 cm) Weight: (!) 136.2 kg (300 lb 4.8 oz) IBW/kg (Calculated) : 45.4  Labs: Recent Labs    04/09/23 2025 04/09/23 2132 04/10/23 0127 04/10/23 1051  HGB 10.9*  --   --  10.8*  HCT 31.4*  --   --  31.4*  PLT 299  --   --  229  APTT  --   --  55*  --   LABPROT  --   --  30.6*  --   INR  --   --  2.9*  --   CREATININE 3.89*  --   --  3.11*  TROPONINIHS  --  22*  --   --    Estimated Creatinine Clearance: 26.6 mL/min (A) (by C-G formula based on SCr of 3.11 mg/dL (H)).  Medical History: Past Medical History:  Diagnosis Date   Depression    History of blood clots 08/13/2011   History of left knee replacement    HTN (hypertension)    Lymphedema    lt leg-per pt   PE (pulmonary thromboembolism) (HCC)    Pressure injury of skin, unspecified injury stage, unspecified location    S/P AKA (above knee amputation) bilateral (HCC)    rt 11/24/07 and lt 01/11/17   PTA warfarin dosing: Warfarin dosing difficult to determine. Went to speak with patient who could not give a firm regimen. Base dose is 5 mg but phones in INR weekly with clinic. He estimates an average of warfarin 6 mg daily. Last dose was 2/4 morning.  Assessment: 68 y.o. 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 Patient was hospitalized last month for recurrent VTE. Patient was on warfarin at the time of that VTE, but at presentation INR was subtherapeutic at 1.2 due to lack of enoxaparin bridge, and so warfarin was seen as not a therapeutic failure. Pt has a large body habitus, and will require higher doses of warfarin. Unsure what his dose was outpatient if the clinic made any adjustments, so not sure what caused the  supratherapeutic INR. Last admission we had to give 10-15 mg doses to get INR therapeutic. No major DDI. Albumin is low at 2.8, which would allow more free warfarin.   Goal of Therapy:  INR 2-3 Monitor platelets by anticoagulation protocol: Yes  Monitoring: Date INR Warfarin Dose   2/5 2.9 6 mg  2/6 4.0 Hold  2/7 4.5 Hold, Vit K 1mg  IV for upcoming port removal   2/8 2.8 HOLD for procedure  2/9 2.1 HOLD for procedure  2/10 1.9 10 mg  2/11 1.9 10 mg  2/12 3.0 2.5 mg  2/13 3.4 5 mg  2/14 3.0 3 mg  2/15 3.1 2.5 mg       Plan:  Will plan to give warfarin 2.5 mg x 1 dose tonight given slight increase in INR above therapeutic range Monitor CBC daily and for signs/symptoms of bleeding; none reported by nursing  No major drug-drug interactions with current medication regimen  Monitor INR daily until stable   Littie Deeds, PharmD Pharmacy Resident  04/20/2023 12:50 PM

## 2023-04-20 NOTE — Plan of Care (Signed)
  Problem: Fluid Volume: Goal: Hemodynamic stability will improve Outcome: Progressing   Problem: Clinical Measurements: Goal: Diagnostic test results will improve Outcome: Progressing Goal: Signs and symptoms of infection will decrease Outcome: Progressing   Problem: Respiratory: Goal: Ability to maintain adequate ventilation will improve Outcome: Progressing   Problem: Education: Goal: Knowledge of General Education information will improve Description: Including pain rating scale, medication(s)/side effects and non-pharmacologic comfort measures Outcome: Progressing   Problem: Health Behavior/Discharge Planning: Goal: Ability to manage health-related needs will improve Outcome: Progressing   Problem: Clinical Measurements: Goal: Ability to maintain clinical measurements within normal limits will improve Outcome: Progressing Goal: Will remain free from infection Outcome: Progressing Goal: Diagnostic test results will improve Outcome: Progressing Goal: Respiratory complications will improve Outcome: Progressing Goal: Cardiovascular complication will be avoided Outcome: Progressing   Problem: Activity: Goal: Risk for activity intolerance will decrease Outcome: Progressing   Problem: Nutrition: Goal: Adequate nutrition will be maintained Outcome: Progressing   Problem: Coping: Goal: Level of anxiety will decrease Outcome: Progressing   Problem: Elimination: Goal: Will not experience complications related to bowel motility Outcome: Progressing Goal: Will not experience complications related to urinary retention Outcome: Progressing   Problem: Pain Managment: Goal: General experience of comfort will improve and/or be controlled Outcome: Progressing   Problem: Skin Integrity: Goal: Risk for impaired skin integrity will decrease Outcome: Progressing   Problem: Activity: Goal: Ability to tolerate increased activity will improve Outcome: Progressing

## 2023-04-21 ENCOUNTER — Other Ambulatory Visit: Payer: Self-pay

## 2023-04-21 DIAGNOSIS — B9562 Methicillin resistant Staphylococcus aureus infection as the cause of diseases classified elsewhere: Secondary | ICD-10-CM | POA: Diagnosis not present

## 2023-04-21 DIAGNOSIS — R7881 Bacteremia: Secondary | ICD-10-CM | POA: Diagnosis not present

## 2023-04-21 LAB — PROTIME-INR
INR: 2.5 — ABNORMAL HIGH (ref 0.8–1.2)
Prothrombin Time: 27.6 s — ABNORMAL HIGH (ref 11.4–15.2)

## 2023-04-21 LAB — CBC
HCT: 29.2 % — ABNORMAL LOW (ref 39.0–52.0)
Hemoglobin: 9.7 g/dL — ABNORMAL LOW (ref 13.0–17.0)
MCH: 30.4 pg (ref 26.0–34.0)
MCHC: 33.2 g/dL (ref 30.0–36.0)
MCV: 91.5 fL (ref 80.0–100.0)
Platelets: 250 10*3/uL (ref 150–400)
RBC: 3.19 MIL/uL — ABNORMAL LOW (ref 4.22–5.81)
RDW: 14.3 % (ref 11.5–15.5)
WBC: 10 10*3/uL (ref 4.0–10.5)
nRBC: 0 % (ref 0.0–0.2)

## 2023-04-21 MED ORDER — WARFARIN SODIUM 5 MG PO TABS
5.0000 mg | ORAL_TABLET | Freq: Once | ORAL | Status: AC
  Administered 2023-04-21: 5 mg via ORAL
  Filled 2023-04-21: qty 1

## 2023-04-21 MED ORDER — LOPERAMIDE HCL 2 MG PO CAPS: 2.0000 mg | ORAL_CAPSULE | ORAL | Status: DC | PRN

## 2023-04-21 NOTE — Progress Notes (Signed)
Progress Note    [name]  TDV:[id number] DOB: Dec 31, 1955  DOA: 04/09/2023 PCP: Dione Housekeeper, MD      Brief Narrative:    Medical records reviewed and are as summarized below:  [name] is a 68 y.o. male  stage IV prostate cancer, recurrent VTE, hypertension, PVD status post AKA, decubitus ulcers, who was admitted on 1/18 with shortness of breath and was found to have recurrent DVT despite Eliquis and then Coumadin (not technically a failure as he was not bridged.)  He had been unable to afford Lovenox as an outpatient.  During hospitalization he was noted to have volume overload and was diuresed.  Stay was also complicated by ileus for stool impaction that was treated aggressively with a bowel regimen.  He was discharged in stable condition on 1/31.  He returned to the ED on 2/4 due to increasing confusion and shortness of breath.  He was found to be saturating 89% on room air.  EMS placed patient on CPAP prior to arrival.  In the ED he was found to be septic with fever of 101.9, respiratory rate 31, leukocytosis 20K, and hypoxia.  Chest x-ray revealed left basilar opacity.  He was placed on BiPAP due to work of breathing and hypoxemia.  He failed weaning trial due to work of breathing though he did have good oxygen saturations.  He was admitted for management of healthcare associated pneumonia and sepsis. Hospital stay has been complex.  CT revealed multiple septic emboli in the lungs.  Port removal was attempted early on as this was suspected nidus of infection, however his INR was supratherapeutic which prevented port removal.  He did receive low-dose vitamin K on 2/7 and ultimately was able to have port removed on 2/9.  The tip culture was positive for MRSA infection.  He underwent TEE on 2/11.  He had further respiratory compromise throughout admission and was on prolonged BiPAP.  Further respiratory compromise appeared to be secondary to acute CHF  exacerbation.  He was treated with IV diuresis and had more than 15 L net fluid loss since admission.  Respiratory failure has improved and he has been weaned off of oxygen.   2/12: Patient currently stable, does not want to go to SNF, asking to complete IV antibiotics and then go home, stating that he had everything in place at home to help.  TEE was negative, per ID will need 4 weeks of antibiotics.   2/13: Continue to improve.  Will complete IV vancomycin seen on 2/23 before leaving home with home health.  Patient will need 2 more weeks of linezolid afterwards.  ID is tapering home Wellbutrin so he can get linezolid.   Chart reviewed   Assessment/Plan:   Principal Problem:   MRSA bacteremia Active Problems:   HAP (hospital-acquired pneumonia)   Severe sepsis (HCC)   PE (pulmonary thromboembolism) (HCC)   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   Ogilvie syndrome   AKI (acute kidney injury) (HCC)   Prostate cancer metastatic to bone [facility])   Essential hypertension   Pulmonary infiltrates   Septic embolism (HCC)   Palliative care encounter   Acute respiratory failure with hypoxia (HCC)   Morbid obesity (HCC)     Body mass index is 62.94 kg/m.  (Morbid obesity)    Severe sepsis, MRSA bacteremia, pneumonia, septic pulmonary emboli: Continue IV vancomycin through 04/28/2023.  This will be followed by 2 weeks of oral linezolid. Wellbutrin is  being tapered off to allow linezolid use in the near future. Port-A-Cath was removed on 04/14/2023 and tip culture was positive for MRSA. TEE was negative for vegetation   Acute on chronic diastolic CHF: S/p treatment with IV Lasix.  Continue oral Lasix.   Pulmonary embolism, history of recurrent venous thromboembolism while on Eliquis: Continue warfarin and monitor INR.   Acute urinary retention: Resolved.  Foley catheter was successfully removed on 04/19/2023.    Hypokalemia: Improved   Stage IV prostate cancer with  metastasis to the bones: Analgesics as needed for pain.  Follow-up with oncologist as an outpatient.   Bilateral upper extremity weakness: Improving. This is likely from deconditioning. Continue PT and OT as able.     Acute hypoxic respiratory failure, acute kidney injury, acute metabolic encephalopathy: Resolved Prednisone taper will be completed on 04/22/2023   Bilateral groin pain: Bilateral hip and pelvic x-ray on 04/20/2023 showed chronic soft tissue calcification of bilateral proximal thighs, degenerative changes of the lumbar spine and underlying osseous metastatic disease.   Diarrhea: No abdominal pain or leukocytosis.  Use Imodium as needed.  MiraLAX and lactulose were discontinued on 04/20/2023.   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   Diet Order             Diet regular Fluid consistency: Thin  Diet effective now                            Consultants: Cardiologist ID specialist Palliative care   Procedures: TEE on 04/16/2023 (no vegetations noted) Port-A-Cath removal by IR on 04/14/2023    Medications:    acetaminophen  650 mg Oral Q6H   [START ON 04/22/2023] buPROPion  75 mg Oral Daily   Followed by   Melene Muller ON 04/25/2023] buPROPion  50 mg Oral Daily   Chlorhexidine Gluconate Cloth  6 each Topical Daily   dicyclomine  10 mg Oral TID AC & HS   Fe Fum-Vit C-Vit B12-FA  1 capsule Oral QPC breakfast   furosemide  20 mg Oral Daily   gabapentin  200 mg Oral TID   leptospermum manuka honey  1 Application Topical Daily   lidocaine  1 patch Transdermal Q24H   nystatin cream   Topical BID   predniSONE  10 mg Oral Q breakfast   warfarin  5 mg Oral ONCE-1600   Warfarin - Pharmacist Dosing Inpatient   Does  not apply q1600   Continuous Infusions:  vancomycin 750 mg (04/21/23 1041)     Anti-infectives (From admission, onward)    Start     Dose/Rate Route Frequency Ordered Stop   04/18/23 0900  vancomycin (VANCOREADY) IVPB 750 mg/150 mL        750 mg 75 mL/hr over 120 Minutes Intravenous Every 12 hours 04/18/23 0818     04/15/23 0800  vancomycin (VANCOREADY) IVPB 1500 mg/300 mL  Status:  Discontinued        1,500 mg 100 mL/hr over 180 Minutes Intravenous Every 24 hours 04/15/23 0649 04/18/23 0818   04/13/23 1030  vancomycin variable dose per unstable renal function (pharmacist dosing)  Status:  Discontinued         Does not apply See admin instructions 04/13/23 1031 04/15/23 0711  04/12/23 1000  vancomycin (VANCOREADY) IVPB 1500 mg/300 mL  Status:  Discontinued        1,500 mg 100 mL/hr over 180 Minutes Intravenous Every 24 hours 04/12/23 0831 04/13/23 1031   04/11/23 1000  vancomycin (VANCOREADY) IVPB 1500 mg/300 mL        1,500 mg 75 mL/hr over 240 Minutes Intravenous  Once 04/11/23 0828 04/11/23 1358   04/10/23 0308  vancomycin variable dose per unstable renal function (pharmacist dosing)  Status:  Discontinued         Does not apply See admin instructions 04/10/23 0308 04/12/23 0831   04/10/23 0145  vancomycin (VANCOREADY) IVPB 1500 mg/300 mL        1,500 mg 100 mL/hr over 180 Minutes Intravenous  Once 04/10/23 0131 04/10/23 0546   04/10/23 0130  vancomycin (VANCOCIN) IVPB 1000 mg/200 mL premix  Status:  Discontinued        1,000 mg 200 mL/hr over 60 Minutes Intravenous  Once 04/10/23 0120 04/10/23 0131   04/09/23 2100  vancomycin (VANCOCIN) IVPB 1000 mg/200 mL premix        1,000 mg 200 mL/hr over 60 Minutes Intravenous  Once 04/09/23 2047 04/10/23 0113   04/09/23 2100  ceFEPIme (MAXIPIME) 2 g in sodium chloride 0.9 % 100 mL IVPB        2 g 200 mL/hr over 30 Minutes Intravenous  Once 04/09/23 2047 04/10/23 0113              Family Communication/Anticipated D/C date  and plan/Code Status   DVT prophylaxis:  warfarin (COUMADIN) tablet 5 mg     Code Status: Limited: Do not attempt resuscitation (DNR) -DNR-LIMITED -Do Not Intubate/DNI   Family Communication: None Disposition Plan: Plan to discharge home   Status is: Inpatient Remains inpatient appropriate because: IV antibiotics for bacteremia       Subjective:   Interval events noted.  He complains of bilateral groin pain.  Mardene Celeste, RN, came to the bedside  Objective:    Vitals:   04/20/23 1742 04/20/23 2230 04/21/23 0621 04/21/23 0840  BP: 114/62 127/68 124/69 122/70  Pulse: (!) 57 62 (!) 55 (!) 58  Resp:  18 18 16   Temp:  98.4 F (36.9 C) 98.5 F (36.9 C) 98.8 F (37.1 C)  TempSrc:      SpO2: 96% 97% 98% 98%  Weight:      Height:       No data found.   Intake/Output Summary (Last 24 hours) at 04/21/2023 1516 Last data filed at 04/21/2023 0865 Gross per 24 hour  Intake 480 ml  Output 602 ml  Net -122 ml   Filed Weights   04/09/23 2019 04/19/23 1555  Weight: 136 kg (!) 136.6 kg    Exam:  GEN: NAD SKIN: Warm and dry EYES: No pallor or icterus ENT: MMM CV: RRR PULM: CTA B ABD: soft, obese, NT, +BS.  No masses palpable in bilateral groin CNS: AAO x 3, non focal EXT: Bilateral AKA.       Data Reviewed:   I have personally reviewed following labs and imaging studies:  Labs: Labs show the following:   Basic Metabolic Panel: Recent Labs  Lab 04/16/23 0603 04/17/23 0422 04/18/23 0642 04/19/23 0650 04/20/23 0451  NA 138 138 140 139 141  K 3.6 2.7* 3.1* 3.3* 3.5  CL 103 106 107 108 111  CO2 25 24 26 25 24   GLUCOSE 126* 95 91 90 111*  BUN 38* 31* 21 16 16  CREATININE 0.92 0.78 0.72 0.65 0.64  CALCIUM 9.6 9.2 9.3 9.1 9.2  MG 1.8 1.6* 1.9 1.7 1.7  PHOS 3.3 2.6 2.9 2.8 2.8   GFR Estimated Creatinine Clearance: 103.8 mL/min (by C-G formula based on SCr of 0.64 mg/dL). Liver Function Tests: Recent Labs  Lab 04/16/23 0603 04/17/23 0422  04/18/23 0642 04/19/23 0650 04/20/23 0451  AST 35 30 24 29  40  ALT 36 35 30 36 44  ALKPHOS 65 59 58 58 61  BILITOT 0.5 0.7 0.6 0.5 0.6  PROT 6.8 5.9* 5.8* 6.0* 5.8*  ALBUMIN 2.8* 2.7* 2.6* 2.6* 2.5*   No results for input(s): "LIPASE", "AMYLASE" in the last 168 hours. No results for input(s): "AMMONIA" in the last 168 hours. Coagulation profile Recent Labs  Lab 04/17/23 0422 04/18/23 0642 04/19/23 0650 04/20/23 0451 04/21/23 0437  INR 3.0* 3.4* 3.0* 3.1* 2.5*    CBC: Recent Labs  Lab 04/16/23 0603 04/17/23 0422 04/18/23 0642 04/19/23 0650 04/20/23 0451 04/21/23 0437  WBC 9.8 9.0 9.0 9.8 10.3 10.0  NEUTROABS 8.3* 7.0 7.0 8.1* 8.6*  --   HGB 11.5* 10.8* 10.1* 9.7* 9.5* 9.7*  HCT 34.2* 32.1* 31.4* 29.7* 29.2* 29.2*  MCV 91.0 91.7 94.3 93.4 93.6 91.5  PLT 403* 356 319 271 240 250   Cardiac Enzymes: No results for input(s): "CKTOTAL", "CKMB", "CKMBINDEX", "TROPONINI" in the last 168 hours. BNP (last 3 results) No results for input(s): "PROBNP" in the last 8760 hours. CBG: Recent Labs  Lab 04/19/23 0815 04/19/23 1240  GLUCAP 74 118*   D-Dimer: No results for input(s): "DDIMER" in the last 72 hours. Hgb A1c: No results for input(s): "HGBA1C" in the last 72 hours. Lipid Profile: No results for input(s): "CHOL", "HDL", "LDLCALC", "TRIG", "CHOLHDL", "LDLDIRECT" in the last 72 hours. Thyroid function studies: No results for input(s): "TSH", "T4TOTAL", "T3FREE", "THYROIDAB" in the last 72 hours.  Invalid input(s): "FREET3" Anemia work up: No results for input(s): "VITAMINB12", "FOLATE", "FERRITIN", "TIBC", "IRON", "RETICCTPCT" in the last 72 hours. Sepsis Labs: Recent Labs  Lab 04/18/23 1610 04/19/23 0650 04/20/23 0451 04/21/23 0437  WBC 9.0 9.8 10.3 10.0    Microbiology Recent Results (from the past 240 hours)  Culture, blood (Routine X 2) w Reflex to ID Panel     Status: None   Collection Time: 04/12/23  5:00 AM   Specimen: BLOOD  Result Value Ref  Range Status   Specimen Description BLOOD UNKNOWN  Final   Special Requests   Final    BOTTLES DRAWN AEROBIC AND ANAEROBIC Blood Culture adequate volume   Culture   Final    NO GROWTH 5 DAYS Performed at [facility], [address], [place], [place] [address]    Report Status 04/17/2023 FINAL  Final  MIC (1 Drug)-blood culture; 04/09/2023; BLOOD RIGHT ARM; MRSA; Daptomycin     Status: Abnormal   Collection Time: 04/12/23  9:40 AM   Specimen: BLOOD RIGHT ARM  Result Value Ref Range Status   Min Inhibitory Conc (1 Drug) Preliminary report (A)  Final    Comment: (NOTE) Performed At: [facility] [address] [place], [place] [id number] Jolene Schimke MD NW:[id number]    Source [phone]  Final    Comment: Performed at [facility], [address], [place], [place] [address]  Culture, blood (Routine X 2) w Reflex to ID Panel     Status: None   Collection Time: 04/12/23  1:35 PM   Specimen: BLOOD  Result Value Ref Range Status  Specimen Description BLOOD BLOOD LEFT FOREARM  Final   Special Requests   Final    BOTTLES DRAWN AEROBIC AND ANAEROBIC Blood Culture adequate volume   Culture   Final    NO GROWTH 5 DAYS Performed at [facility], [address], [place], [place] [address]    Report Status 04/17/2023 FINAL  Final  Cath Tip Culture     Status: Abnormal   Collection Time: 04/14/23 12:00 PM   Specimen: Catheter Tip; Other  Result Value Ref Range Status   Specimen Description   Final    CATH TIP Performed at [facility], [address] [place], [place] [address]    Special Requests   Final    NONE Performed at [facility], [address] [place], [place] [address]    Culture (A)  Final    >=100,000 COLONIES/mL METHICILLIN RESISTANT STAPHYLOCOCCUS AUREUS   Report Status 04/17/2023 FINAL  Final   Organism ID, Bacteria METHICILLIN RESISTANT STAPHYLOCOCCUS AUREUS (A)  Final      Susceptibility   Methicillin resistant  staphylococcus aureus - MIC*    CIPROFLOXACIN >=8 RESISTANT Resistant     ERYTHROMYCIN >=8 RESISTANT Resistant     GENTAMICIN <=0.5 SENSITIVE Sensitive     OXACILLIN >=4 RESISTANT Resistant     TETRACYCLINE <=1 SENSITIVE Sensitive     VANCOMYCIN 1 SENSITIVE Sensitive     TRIMETH/SULFA <=10 SENSITIVE Sensitive     CLINDAMYCIN <=0.25 SENSITIVE Sensitive     RIFAMPIN <=0.5 SENSITIVE Sensitive     Inducible Clindamycin NEGATIVE Sensitive     LINEZOLID 2 SENSITIVE Sensitive     * >=100,000 COLONIES/mL METHICILLIN RESISTANT STAPHYLOCOCCUS AUREUS  Culture, blood (Routine X 2) w Reflex to ID Panel     Status: None   Collection Time: 04/15/23 10:05 PM   Specimen: BLOOD  Result Value Ref Range Status   Specimen Description BLOOD BLOOD RIGHT ARM  Final   Special Requests   Final    BOTTLES DRAWN AEROBIC AND ANAEROBIC Blood Culture adequate volume   Culture   Final    NO GROWTH 5 DAYS Performed at [facility], [address] [place], [place] [address]    Report Status 04/20/2023 FINAL  Final  Culture, blood (Routine X 2) w Reflex to ID Panel     Status: None   Collection Time: 04/15/23 10:05 PM   Specimen: BLOOD  Result Value Ref Range Status   Specimen Description BLOOD BLOOD LEFT ARM  Final   Special Requests   Final    BOTTLES DRAWN AEROBIC AND ANAEROBIC Blood Culture adequate volume   Culture   Final    NO GROWTH 5 DAYS Performed at [facility], [address] [place], [place] [address]    Report Status 04/20/2023 FINAL  Final    Procedures and diagnostic studies:  DG HIPS BILAT WITH PELVIS MIN 5 VIEWS Result Date: 04/20/2023 CLINICAL DATA:  [id number] Bilateral groin pain [id number] EXAM: DG HIP (WITH OR WITHOUT PELVIS) 5+V BILAT COMPARISON:  March 09, 2023, October 12, 2022 FINDINGS: Mottled appearance of the osseous structures consistent with underlying osseous metastatic disease. No acute fracture or dislocation. Sacrum is obscured by overlapping bowel contents.  Degenerative changes of the lower lumbar spine. Chronic soft tissue calcification of bilateral proximal thighs. IMPRESSION: Mottled appearance of the osseous structures consistent with underlying osseous metastatic disease. No acute fracture or dislocation. If there is a persistent clinical concern for nondisplaced hip or pelvic fracture, recommend dedicated pelvic  CT or MRI. Electronically Signed   By: Meda Klinefelter M.D.   On: 04/20/2023 14:10               LOS: 11 days   Kregg Cihlar  [facility]   Pager on [email or web address]. If 7PM-7AM, please contact night-coverage at [email or web address]     04/21/2023, 3:16 PM

## 2023-04-21 NOTE — Consult Note (Signed)
PHARMACY - ANTICOAGULATION CONSULT NOTE  Pharmacy Consult for Warfarin Indication: hx recurrent VTE  Patient Measurements: Height: 4\' 10"  (147.3 cm) Weight: (!) 136.2 kg (300 lb 4.8 oz) IBW/kg (Calculated) : 45.4  Labs: Recent Labs    04/09/23 2025 04/09/23 2132 04/10/23 0127 04/10/23 1051  HGB 10.9*  --   --  10.8*  HCT 31.4*  --   --  31.4*  PLT 299  --   --  229  APTT  --   --  55*  --   LABPROT  --   --  30.6*  --   INR  --   --  2.9*  --   CREATININE 3.89*  --   --  3.11*  TROPONINIHS  --  22*  --   --    Estimated Creatinine Clearance: 26.6 mL/min (A) (by C-G formula based on SCr of 3.11 mg/dL (H)).  Medical History: Past Medical History:  Diagnosis Date   Depression    History of blood clots 08/13/2011   History of left knee replacement    HTN (hypertension)    Lymphedema    lt leg-per pt   PE (pulmonary thromboembolism) (HCC)    Pressure injury of skin, unspecified injury stage, unspecified location    S/P AKA (above knee amputation) bilateral (HCC)    rt 11/24/07 and lt 01/11/17   PTA warfarin dosing: Warfarin dosing difficult to determine. Went to speak with patient who could not give a firm regimen. Base dose is 5 mg but phones in INR weekly with clinic. He estimates an average of warfarin 6 mg daily. Last dose was 2/4 morning.  Assessment: 68 y.o. 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 Patient was hospitalized last month for recurrent VTE. Patient was on warfarin at the time of that VTE, but at presentation INR was subtherapeutic at 1.2 due to lack of enoxaparin bridge, and so warfarin was seen as not a therapeutic failure. Pt has a large body habitus, and will require higher doses of warfarin. Unsure what his dose was outpatient if the clinic made any adjustments, so not sure what caused the  supratherapeutic INR. Last admission we had to give 10-15 mg doses to get INR therapeutic. No major DDI. Albumin is low at 2.8, which would allow more free warfarin.   Goal of Therapy:  INR 2-3 Monitor platelets by anticoagulation protocol: Yes  Monitoring: Date INR Warfarin Dose   2/5 2.9 6 mg  2/6 4.0 Hold  2/7 4.5 Hold, Vit K 1mg  IV for upcoming port removal   2/8 2.8 HOLD for procedure  2/9 2.1 HOLD for procedure  2/10 1.9 10 mg  2/11 1.9 10 mg  2/12 3.0 2.5 mg  2/13 3.4 5 mg  2/14 3.0 3 mg  2/15 3.1 2.5 mg  2/16 2.5 5 mg ordered   Plan:  INR now within therapeutic range  Given INR continues to trend down, will give warfarin 5 mg x 1 tonight  Monitor CBC daily and for signs/symptoms of bleeding; none reported by nursing  No major drug-drug interactions with current medication regimen  Monitor INR daily until stable   Littie Deeds, PharmD Pharmacy Resident  04/21/2023 11:44 AM

## 2023-04-22 DIAGNOSIS — R7881 Bacteremia: Secondary | ICD-10-CM | POA: Diagnosis not present

## 2023-04-22 DIAGNOSIS — B9562 Methicillin resistant Staphylococcus aureus infection as the cause of diseases classified elsewhere: Secondary | ICD-10-CM | POA: Diagnosis not present

## 2023-04-22 LAB — CBC
HCT: 31 % — ABNORMAL LOW (ref 39.0–52.0)
Hemoglobin: 10.1 g/dL — ABNORMAL LOW (ref 13.0–17.0)
MCH: 30.5 pg (ref 26.0–34.0)
MCHC: 32.6 g/dL (ref 30.0–36.0)
MCV: 93.7 fL (ref 80.0–100.0)
Platelets: 254 10*3/uL (ref 150–400)
RBC: 3.31 MIL/uL — ABNORMAL LOW (ref 4.22–5.81)
RDW: 14.5 % (ref 11.5–15.5)
WBC: 8.2 10*3/uL (ref 4.0–10.5)
nRBC: 0 % (ref 0.0–0.2)

## 2023-04-22 LAB — PROTIME-INR
INR: 2.4 — ABNORMAL HIGH (ref 0.8–1.2)
Prothrombin Time: 26 s — ABNORMAL HIGH (ref 11.4–15.2)

## 2023-04-22 MED ORDER — WARFARIN SODIUM 5 MG PO TABS
5.0000 mg | ORAL_TABLET | Freq: Once | ORAL | Status: AC
  Administered 2023-04-22: 5 mg via ORAL
  Filled 2023-04-22: qty 1

## 2023-04-22 NOTE — Progress Notes (Signed)
Pharmacy Antibiotic Note  [name] is a 68 y.o. male admitted on 04/09/2023 with MRSA bacteremia.  Pharmacy has been consulted for Vancomycin dosing.  Patient with recent admission for recurrent DVT, discharged 1/31.  Comes to ED 2/4 with confusion and shortness of breath.  CT chest/abdomen/pelvis lung findings concerning for septic pulmonary emboli.  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 ID consulted.   Today, 04/22/2023 Day #13 vancomycin Renal: AKI - resolved, SCr 0.64 mg/dl [id number] WBC WNL 2/4 blood culture: 3/4 bottles MRSA 2/5 Urine cx: NG Respiratory panel and respiratory virus panel neg 2/5 C difficile neg Rule out endocarditis with concern for septic pulmonary embolism on CT 2/5 ECHO: no endocarditis 2/7 repeat blood cultures: NG<24 hrs 2/9 Port removed - tip culture growing MRSA 2/10 repeat blood cultures: NGTD 2/11 TEE no endocarditis  Vancomycin dosing/levels: Vancomycin doses given 2/4 1gm IV x 1 at 2356 then 2/5 1500mg  IV  x1 2/6 random vancomycin level at 0603 = 11 mcg/mL 2/6 vancomycin 1500mg  IV x 1 given at 0947, 2/7 random vancomycin level 0553 = 16 mcg/mL 2/7 Vancomycin 1500 mg given 2/7@1046  2/8 vancomycin 1500 mg given 2/8 @0836  2/9 random vancomycin level @0502  = 23 mcg/ml,  no dose ordered 2/10 random vancomycin 12. Last dose 2/8 @0836  (1500mg  ) 2/11 random vancomycin  = 11 MCG/Ml AT 06:03, previous dose vanco 1500mg  given 2/10 at 11:00 Vancomycin dose 1500mg  IV q24h, dose given at 2/12 at 10:04 over 3hr infusion (level with 3rd dose of 1500mg  q24h) Vancomycin peak = 32 mcg/mL 2/12 at 14:20 Vancomycin randome = 11 mcg/ml 2/13 at 06:42 AUC = 464, Cmax 34.8 mcg/ml and Cmin 8.8 mcg/mL  Plan: Continue vancomycin to 750mg  IV q12h. Infuse each dose over 2h  AUC = 463, Cmax = 26.1 mcg/mL and Cmin 13.6  mcg/mL  Note: history of infusion related reaction with vancomycin - slow infusion Anticipate  some difficulty dosing vancomycin with PMH Bilateral AKA - effect on volume of distribution and SCr (loss of muscle mass).   Follow renal function closely Plan to check vancomycin level abd Scr 2/18 ID planning vancomycin until 04/28/23 then complete two more weeks of antibiotic therapy with linezolid 600mg  PO BID (estimated copay $68)  Daptomycin- not currently option with septic PE, Pneumonia  Temp (24hrs), Avg:98.5 F (36.9 C), Min:97.9 F (36.6 C), Max:99 F (37.2 C)   Recent Labs  Lab 04/16/23 0603 04/17/23 0422 04/17/23 1420 04/18/23 0642 04/19/23 0650 04/20/23 0451 04/21/23 0437 04/22/23 0621  WBC 9.8 9.0  --  9.0 9.8 10.3 10.0 8.2  CREATININE 0.92 0.78  --  0.72 0.65 0.64  --   --   VANCOPEAK  --   --  32  --   --   --   --   --   VANCORANDOM 11  --   --  11  --   --   --   --     Estimated Creatinine Clearance: 103.8 mL/min (by C-G formula based on SCr of 0.64 mg/dL).    Allergies  Allergen Reactions   Bacitracin Hives   Vancomycin Other (See Comments) and Rash    Red man syndrome.   Ketorolac     Other Reaction(s): Kidney Disorder  Other reaction(s): Kidney Disorder   Spironolactone     Other Reaction(s): Unknown  Other reaction(s): Unknown  Antimicrobials this admission: 2/4 Cefepime >> x 1 dose 2/4 Vancomycin >>    Thank you for allowing pharmacy to be a part of this patient's care.  Juliette Alcide, PharmD, BCPS, BCIDP Work Cell: [phone] 04/22/2023 10:10 AM

## 2023-04-22 NOTE — Plan of Care (Signed)

## 2023-04-22 NOTE — Progress Notes (Signed)
Progress Note    [name]  WUJ:[id number] DOB: 16-Dec-1955  DOA: 04/09/2023 PCP: Dione Housekeeper, MD      Brief Narrative:    Medical records reviewed and are as summarized below:  [name] is a 68 y.o. male  stage IV prostate cancer, recurrent VTE, hypertension, PVD status post AKA, decubitus ulcers, who was admitted on 1/18 with shortness of breath and was found to have recurrent DVT despite Eliquis and then Coumadin (not technically a failure as he was not bridged.)  He had been unable to afford Lovenox as an outpatient.  During hospitalization he was noted to have volume overload and was diuresed.  Stay was also complicated by ileus for stool impaction that was treated aggressively with a bowel regimen.  He was discharged in stable condition on 1/31.  He returned to the ED on 2/4 due to increasing confusion and shortness of breath.  He was found to be saturating 89% on room air.  EMS placed patient on CPAP prior to arrival.  In the ED he was found to be septic with fever of 101.9, respiratory rate 31, leukocytosis 20K, and hypoxia.  Chest x-ray revealed left basilar opacity.  He was placed on BiPAP due to work of breathing and hypoxemia.  He failed weaning trial due to work of breathing though he did have good oxygen saturations.  He was admitted for management of healthcare associated pneumonia and sepsis. Hospital stay has been complex.  CT revealed multiple septic emboli in the lungs.  Port removal was attempted early on as this was suspected nidus of infection, however his INR was supratherapeutic which prevented port removal.  He did receive low-dose vitamin K on 2/7 and ultimately was able to have port removed on 2/9.  The tip culture was positive for MRSA infection.  He underwent TEE on 2/11.  He had further respiratory compromise throughout admission and was on prolonged BiPAP.  Further respiratory compromise appeared to be secondary to acute CHF  exacerbation.  He was treated with IV diuresis and had more than 15 L net fluid loss since admission.  Respiratory failure has improved and he has been weaned off of oxygen.   2/12: Patient currently stable, does not want to go to SNF, asking to complete IV antibiotics and then go home, stating that he had everything in place at home to help.  TEE was negative, per ID will need 4 weeks of antibiotics.   2/13: Continue to improve.  Will complete IV vancomycin seen on 2/23 before leaving home with home health.  Patient will need 2 more weeks of linezolid afterwards.  ID is tapering home Wellbutrin so he can get linezolid.   Chart reviewed   Assessment/Plan:   Principal Problem:   MRSA bacteremia Active Problems:   HAP (hospital-acquired pneumonia)   Severe sepsis (HCC)   PE (pulmonary thromboembolism) (HCC)   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   Ogilvie syndrome   AKI (acute kidney injury) (HCC)   Prostate cancer metastatic to bone [facility])   Essential hypertension   Pulmonary infiltrates   Septic embolism (HCC)   Palliative care encounter   Acute respiratory failure with hypoxia (HCC)   Morbid obesity (HCC)     Body mass index is 62.94 kg/m.  (Morbid obesity)    Severe sepsis, MRSA bacteremia, pneumonia, septic pulmonary emboli: Continue IV vancomycin through 04/28/2023.  This will be followed by 2 weeks of oral linezolid. Wellbutrin is  being tapered off to allow linezolid use in the near future. Port-A-Cath was removed on 04/14/2023 and tip culture was positive for MRSA. TEE was negative for vegetation   Acute on chronic diastolic CHF: S/p treatment with IV Lasix.  Continue oral Lasix.   Pulmonary embolism, history of recurrent venous thromboembolism while on Eliquis: Continue warfarin and monitor INR.   Acute urinary retention: Resolved.  Foley catheter was successfully removed on 04/19/2023.    Hypokalemia: Improved   Stage IV prostate cancer with  metastasis to the bones: Analgesics as needed for pain.  Follow-up with oncologist as an outpatient.   Bilateral upper extremity weakness: Improving. This is likely from deconditioning. Continue PT and OT as able.     Acute hypoxic respiratory failure, acute kidney injury, acute metabolic encephalopathy: Resolved Completed prednisone taper on 04/22/2023.   Bilateral groin pain: Bilateral hip and pelvic x-ray on 04/20/2023 showed chronic soft tissue calcification of bilateral proximal thighs, degenerative changes of the lumbar spine and underlying osseous metastatic disease.   Diarrhea: Improving.    Use Imodium as needed.  MiraLAX and lactulose were discontinued on 04/20/2023.   Patient reported broken tooth, possibly one of the right upper molar teeth: recommended outpatient follow-up with a dentist.  He said there is a dental office close to where he lives.  He plans to make an appointment.   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, history of Ogilvie syndrome (was on lactulose for this), anemia of chronic disease      Diet Order             Diet regular Fluid consistency: Thin  Diet effective now                            Consultants: Cardiologist ID specialist Palliative care   Procedures: TEE on 04/16/2023 (no vegetations noted) Port-A-Cath removal by IR on 04/14/2023    Medications:    acetaminophen  650 mg Oral Q6H   buPROPion  75 mg Oral Daily   Followed by   Melene Muller ON 04/25/2023] buPROPion  50 mg Oral Daily   dicyclomine  10 mg Oral TID AC & HS   Fe Fum-Vit C-Vit B12-FA  1 capsule Oral QPC breakfast   furosemide  20 mg Oral Daily   gabapentin  200 mg Oral TID   leptospermum manuka honey  1 Application Topical Daily   lidocaine  1 patch Transdermal Q24H   nystatin cream   Topical BID   warfarin  5 mg Oral ONCE-1600   Warfarin - Pharmacist Dosing Inpatient   Does not apply q1600    Continuous Infusions:  vancomycin 750 mg (04/22/23 0530)     Anti-infectives (From admission, onward)    Start     Dose/Rate Route Frequency Ordered Stop   04/18/23 0900  vancomycin (VANCOREADY) IVPB 750 mg/150 mL        750 mg 75 mL/hr over 120 Minutes Intravenous Every 12 hours 04/18/23 0818     04/15/23 0800  vancomycin (VANCOREADY) IVPB 1500 mg/300 mL  Status:  Discontinued        1,500 mg 100 mL/hr over 180 Minutes Intravenous Every 24 hours 04/15/23 0649 04/18/23 0818   04/13/23 1030  vancomycin variable dose per unstable renal function (pharmacist dosing)  Status:  Discontinued         Does not apply See admin instructions 04/13/23 1031 04/15/23 0711   04/12/23  1000  vancomycin (VANCOREADY) IVPB 1500 mg/300 mL  Status:  Discontinued        1,500 mg 100 mL/hr over 180 Minutes Intravenous Every 24 hours 04/12/23 0831 04/13/23 1031   04/11/23 1000  vancomycin (VANCOREADY) IVPB 1500 mg/300 mL        1,500 mg 75 mL/hr over 240 Minutes Intravenous  Once 04/11/23 0828 04/11/23 1358   04/10/23 0308  vancomycin variable dose per unstable renal function (pharmacist dosing)  Status:  Discontinued         Does not apply See admin instructions 04/10/23 0308 04/12/23 0831   04/10/23 0145  vancomycin (VANCOREADY) IVPB 1500 mg/300 mL        1,500 mg 100 mL/hr over 180 Minutes Intravenous  Once 04/10/23 0131 04/10/23 0546   04/10/23 0130  vancomycin (VANCOCIN) IVPB 1000 mg/200 mL premix  Status:  Discontinued        1,000 mg 200 mL/hr over 60 Minutes Intravenous  Once 04/10/23 0120 04/10/23 0131   04/09/23 2100  vancomycin (VANCOCIN) IVPB 1000 mg/200 mL premix        1,000 mg 200 mL/hr over 60 Minutes Intravenous  Once 04/09/23 2047 04/10/23 0113   04/09/23 2100  ceFEPIme (MAXIPIME) 2 g in sodium chloride 0.9 % 100 mL IVPB        2 g 200 mL/hr over 30 Minutes Intravenous  Once 04/09/23 2047 04/10/23 0113              Family Communication/Anticipated D/C date and plan/Code  Status   DVT prophylaxis:  warfarin (COUMADIN) tablet 5 mg     Code Status: Limited: Do not attempt resuscitation (DNR) -DNR-LIMITED -Do Not Intubate/DNI   Family Communication: None Disposition Plan: Plan to discharge home   Status is: Inpatient Remains inpatient appropriate because: IV antibiotics for bacteremia       Subjective:   Interval events noted.  He complained that the right upper tooth broke in half this morning and it got stuck in his gum.  No other complaints.   Objective:    Vitals:   04/21/23 2002 04/22/23 0624 04/22/23 0817 04/22/23 1548  BP: (!) 104/59 97/66 119/68 115/66  Pulse: 71 (!) 57 64 65  Resp: 18 16 18 19   Temp: 99 F (37.2 C) 98.4 F (36.9 C) 97.9 F (36.6 C) 99.5 F (37.5 C)  TempSrc:  Oral Oral Oral  SpO2: 97% 99% 100% 98%  Weight:      Height:       No data found.   Intake/Output Summary (Last 24 hours) at 04/22/2023 1612 Last data filed at 04/22/2023 4098 Gross per 24 hour  Intake --  Output 1200 ml  Net -1200 ml   Filed Weights   04/09/23 2019 04/19/23 1555  Weight: 136 kg (!) 136.6 kg    Exam:  GEN: NAD SKIN: Warm and dry EYES: Anicteric ENT: MMM, I did not see any loose tooth  or cracked tooth in his mouth CV: RRR PULM: CTA B ABD: soft, obese, NT, +BS CNS: AAO x 3, non focal EXT: Bilateral AKA     Data Reviewed:   I have personally reviewed following labs and imaging studies:  Labs: Labs show the following:   Basic Metabolic Panel: Recent Labs  Lab 04/16/23 0603 04/17/23 0422 04/18/23 0642 04/19/23 0650 04/20/23 0451  NA 138 138 140 139 141  K 3.6 2.7* 3.1* 3.3* 3.5  CL 103 106 107 108 111  CO2 25 24 26 25  24  GLUCOSE 126* 95 91 90 111*  BUN 38* 31* 21 16 16   CREATININE 0.92 0.78 0.72 0.65 0.64  CALCIUM 9.6 9.2 9.3 9.1 9.2  MG 1.8 1.6* 1.9 1.7 1.7  PHOS 3.3 2.6 2.9 2.8 2.8   GFR Estimated Creatinine Clearance: 103.8 mL/min (by C-G formula based on SCr of 0.64 mg/dL). Liver Function  Tests: Recent Labs  Lab 04/16/23 0603 04/17/23 0422 04/18/23 0642 04/19/23 0650 04/20/23 0451  AST 35 30 24 29  40  ALT 36 35 30 36 44  ALKPHOS 65 59 58 58 61  BILITOT 0.5 0.7 0.6 0.5 0.6  PROT 6.8 5.9* 5.8* 6.0* 5.8*  ALBUMIN 2.8* 2.7* 2.6* 2.6* 2.5*   No results for input(s): "LIPASE", "AMYLASE" in the last 168 hours. No results for input(s): "AMMONIA" in the last 168 hours. Coagulation profile Recent Labs  Lab 04/18/23 0642 04/19/23 0650 04/20/23 0451 04/21/23 0437 04/22/23 0621  INR 3.4* 3.0* 3.1* 2.5* 2.4*    CBC: Recent Labs  Lab 04/16/23 0603 04/17/23 0422 04/18/23 1610 04/19/23 0650 04/20/23 0451 04/21/23 0437 04/22/23 0621  WBC 9.8 9.0 9.0 9.8 10.3 10.0 8.2  NEUTROABS 8.3* 7.0 7.0 8.1* 8.6*  --   --   HGB 11.5* 10.8* 10.1* 9.7* 9.5* 9.7* 10.1*  HCT 34.2* 32.1* 31.4* 29.7* 29.2* 29.2* 31.0*  MCV 91.0 91.7 94.3 93.4 93.6 91.5 93.7  PLT 403* 356 319 271 240 250 254   Cardiac Enzymes: No results for input(s): "CKTOTAL", "CKMB", "CKMBINDEX", "TROPONINI" in the last 168 hours. BNP (last 3 results) No results for input(s): "PROBNP" in the last 8760 hours. CBG: Recent Labs  Lab 04/19/23 0815 04/19/23 1240  GLUCAP 74 118*   D-Dimer: No results for input(s): "DDIMER" in the last 72 hours. Hgb A1c: No results for input(s): "HGBA1C" in the last 72 hours. Lipid Profile: No results for input(s): "CHOL", "HDL", "LDLCALC", "TRIG", "CHOLHDL", "LDLDIRECT" in the last 72 hours. Thyroid function studies: No results for input(s): "TSH", "T4TOTAL", "T3FREE", "THYROIDAB" in the last 72 hours.  Invalid input(s): "FREET3" Anemia work up: No results for input(s): "VITAMINB12", "FOLATE", "FERRITIN", "TIBC", "IRON", "RETICCTPCT" in the last 72 hours. Sepsis Labs: Recent Labs  Lab 04/19/23 0650 04/20/23 0451 04/21/23 0437 04/22/23 0621  WBC 9.8 10.3 10.0 8.2    Microbiology Recent Results (from the past 240 hours)  Cath Tip Culture     Status: Abnormal    Collection Time: 04/14/23 12:00 PM   Specimen: Catheter Tip; Other  Result Value Ref Range Status   Specimen Description   Final    CATH TIP Performed at [facility], [address] [place], [place] [address]    Special Requests   Final    NONE Performed at [facility], [address] [place], [place] [address]    Culture (A)  Final    >=100,000 COLONIES/mL METHICILLIN RESISTANT STAPHYLOCOCCUS AUREUS   Report Status 04/17/2023 FINAL  Final   Organism ID, Bacteria METHICILLIN RESISTANT STAPHYLOCOCCUS AUREUS (A)  Final      Susceptibility   Methicillin resistant staphylococcus aureus - MIC*    CIPROFLOXACIN >=8 RESISTANT Resistant     ERYTHROMYCIN >=8 RESISTANT Resistant     GENTAMICIN <=0.5 SENSITIVE Sensitive     OXACILLIN >=4 RESISTANT Resistant     TETRACYCLINE <=1 SENSITIVE Sensitive     VANCOMYCIN 1 SENSITIVE Sensitive     TRIMETH/SULFA <=10 SENSITIVE Sensitive     CLINDAMYCIN <=0.25 SENSITIVE Sensitive     RIFAMPIN <=0.5 SENSITIVE Sensitive  Inducible Clindamycin NEGATIVE Sensitive     LINEZOLID 2 SENSITIVE Sensitive     * >=100,000 COLONIES/mL METHICILLIN RESISTANT STAPHYLOCOCCUS AUREUS  Culture, blood (Routine X 2) w Reflex to ID Panel     Status: None   Collection Time: 04/15/23 10:05 PM   Specimen: BLOOD  Result Value Ref Range Status   Specimen Description BLOOD BLOOD RIGHT ARM  Final   Special Requests   Final    BOTTLES DRAWN AEROBIC AND ANAEROBIC Blood Culture adequate volume   Culture   Final    NO GROWTH 5 DAYS Performed at [facility], [address] [place], [place] [address]    Report Status 04/20/2023 FINAL  Final  Culture, blood (Routine X 2) w Reflex to ID Panel     Status: None   Collection Time: 04/15/23 10:05 PM   Specimen: BLOOD  Result Value Ref Range Status   Specimen Description BLOOD BLOOD LEFT ARM  Final   Special Requests   Final    BOTTLES DRAWN AEROBIC AND ANAEROBIC Blood Culture adequate volume    Culture   Final    NO GROWTH 5 DAYS Performed at [facility], [address], [place], [place] [address]    Report Status 04/20/2023 FINAL  Final    Procedures and diagnostic studies:  No results found.              LOS: 12 days   [name]  [employer] Chartered loss adjuster on [email or web address]. If 7PM-7AM, please contact night-coverage at [email or web address]     04/22/2023, 4:12 PM

## 2023-04-22 NOTE — Consult Note (Signed)
PHARMACY - ANTICOAGULATION CONSULT NOTE  Pharmacy Consult for Warfarin Indication: hx recurrent VTE  Patient Measurements: Height: 4\' 10"  (147.3 cm) Weight: (!) 136.2 kg (300 lb 4.8 oz) IBW/kg (Calculated) : 45.4  Labs: Recent Labs    04/09/23 2025 04/09/23 2132 04/10/23 0127 04/10/23 1051  HGB 10.9*  --   --  10.8*  HCT 31.4*  --   --  31.4*  PLT 299  --   --  229  APTT  --   --  55*  --   LABPROT  --   --  30.6*  --   INR  --   --  2.9*  --   CREATININE 3.89*  --   --  3.11*  TROPONINIHS  --  22*  --   --    Estimated Creatinine Clearance: 26.6 mL/min (A) (by C-G formula based on SCr of 3.11 mg/dL (H)).  Medical History: Past Medical History:  Diagnosis Date   Depression    History of blood clots 08/13/2011   History of left knee replacement    HTN (hypertension)    Lymphedema    lt leg-per pt   PE (pulmonary thromboembolism) (HCC)    Pressure injury of skin, unspecified injury stage, unspecified location    S/P AKA (above knee amputation) bilateral (HCC)    rt 11/24/07 and lt 01/11/17   PTA warfarin dosing: Warfarin dosing difficult to determine. Went to speak with patient who could not give a firm regimen. Base dose is 5 mg but phones in INR weekly with clinic. He estimates an average of warfarin 6 mg daily. Last dose was 2/4 morning.  Assessment: 68 y.o. 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 Patient was hospitalized last month for recurrent VTE. Patient was on warfarin at the time of that VTE, but at presentation INR was subtherapeutic at 1.2 due to lack of enoxaparin bridge, and so warfarin was seen as not a therapeutic failure. Pt has a large body habitus, and will require higher doses of warfarin. Unsure what his dose was outpatient if the clinic made any adjustments, so not sure what caused the  supratherapeutic INR. Last admission we had to give 10-15 mg doses to get INR therapeutic. No major DDI. Albumin is low at 2.8, which would allow more free warfarin.   Goal of Therapy:  INR 2-3 Monitor platelets by anticoagulation protocol: Yes  Monitoring: Date INR Warfarin Dose   2/5 2.9 6 mg  2/6 4.0 Hold  2/7 4.5 Hold, Vit K 1mg  IV for upcoming port removal   2/8 2.8 HOLD for procedure  2/9 2.1 HOLD for procedure  2/10 1.9 10 mg  2/11 1.9 10 mg  2/12 3.0 2.5 mg  2/13 3.4 5 mg  2/14 3.0 3 mg  2/15 3.1 2.5 mg  2/16 2.5 5 mg   2/17 2.4        Plan:  INR within therapeutic range  will give warfarin 5 mg x 1 tonight  Monitor CBC daily and for signs/symptoms of bleeding; none reported by nursing  No major drug-drug interactions with current medication regimen  Monitor INR daily until stable   Royanne Warshaw A, PharmD 04/22/2023 1:08 PM

## 2023-04-22 NOTE — Plan of Care (Signed)

## 2023-04-22 NOTE — Progress Notes (Signed)
Mobility Specialist - Progress Note   04/22/23 1136  Mobility  Activity Dangled on edge of bed (supine/seated exercise)  Level of Assistance Independent  Assistive Device None  Range of Motion/Exercises Active;Right arm;Left arm  Activity Response Tolerated well  Mobility visit 1 Mobility  Mobility Specialist Start Time (ACUTE ONLY) 1118  Mobility Specialist Stop Time (ACUTE ONLY) 1136  Mobility Specialist Time Calculation (min) (ACUTE ONLY) 18 min   Pt completed the following seated exercises w/ theraband:  - single arm curls (yellow) 5x15 - seated rows (yellow) 5x15  - cross body punches (blue) 2x15  Pt left in fowler position with needs within reach.  Zetta Bills Mobility Specialist 04/22/23 11:41 AM

## 2023-04-22 NOTE — TOC Progression Note (Signed)
Transition of Care [facility]) - Progression Note    Patient Details  Name: [name] MRN: [id number] Date of Birth: [birth year]  Transition of [facility]) CM/SW Contact  Allena Katz, LCSW Phone Number: 04/22/2023, 9:58 AM  Clinical Narrative:   Per MD, patient will complete antibiotic course in hospital.          Expected Discharge Plan and Services                                               Social Determinants of Health (SDOH) Interventions SDOH Screenings   Food Insecurity: No Food Insecurity (04/11/2023)  Housing: Low Risk  (04/11/2023)  Transportation Needs: No Transportation Needs (04/11/2023)  Utilities: Not At Risk (04/11/2023)  Financial Resource Strain: High Risk (07/28/2021)   Received from [facility], [facility]  Physical Activity: Sufficiently Active (07/28/2021)   Received from [facility], [facility]  Social Connections: Moderately Integrated (04/11/2023)  Stress: Stress Concern Present (08/01/2021)   Received from [facility], [facility]  Tobacco Use: Medium Risk (04/20/2023)    Readmission Risk Interventions    03/25/2023   10:53 AM  Readmission Risk Prevention Plan  Transportation Screening Complete  PCP or Specialist Appt within 5-7 Days Complete  Home Care Screening Complete  Medication Review (RN CM) Complete

## 2023-04-23 DIAGNOSIS — R7881 Bacteremia: Secondary | ICD-10-CM | POA: Diagnosis not present

## 2023-04-23 DIAGNOSIS — B9562 Methicillin resistant Staphylococcus aureus infection as the cause of diseases classified elsewhere: Secondary | ICD-10-CM | POA: Diagnosis not present

## 2023-04-23 LAB — BASIC METABOLIC PANEL
Anion gap: 8 (ref 5–15)
BUN: 16 mg/dL (ref 8–23)
CO2: 24 mmol/L (ref 22–32)
Calcium: 8.7 mg/dL — ABNORMAL LOW (ref 8.9–10.3)
Chloride: 106 mmol/L (ref 98–111)
Creatinine, Ser: 0.76 mg/dL (ref 0.61–1.24)
GFR, Estimated: >60 mL/min (ref >60)
Glucose, Bld: 81 mg/dL (ref 70–99)
Potassium: 3.5 mmol/L (ref 3.5–5.1)
Sodium: 138 mmol/L (ref 135–145)

## 2023-04-23 LAB — PROTIME-INR
INR: 2.1 — ABNORMAL HIGH (ref 0.8–1.2)
Prothrombin Time: 24.1 s — ABNORMAL HIGH (ref 11.4–15.2)

## 2023-04-23 LAB — MAGNESIUM: Magnesium: 1.6 mg/dL — ABNORMAL LOW (ref 1.7–2.4)

## 2023-04-23 MED ORDER — POTASSIUM CHLORIDE CRYS ER 20 MEQ PO TBCR
40.0000 meq | EXTENDED_RELEASE_TABLET | Freq: Once | ORAL | Status: AC
  Administered 2023-04-23: 40 meq via ORAL
  Filled 2023-04-23: qty 2

## 2023-04-23 MED ORDER — WARFARIN SODIUM 6 MG PO TABS
6.0000 mg | ORAL_TABLET | Freq: Once | ORAL | Status: AC
  Administered 2023-04-23: 6 mg via ORAL
  Filled 2023-04-23: qty 1

## 2023-04-23 MED ORDER — LOPERAMIDE HCL 2 MG PO CAPS
2.0000 mg | ORAL_CAPSULE | Freq: Three times a day (TID) | ORAL | Status: AC
  Administered 2023-04-23 (×2): 2 mg via ORAL
  Filled 2023-04-23 (×2): qty 1

## 2023-04-23 MED ORDER — MAGNESIUM SULFATE 2 GM/50ML IV SOLN
2.0000 g | Freq: Once | INTRAVENOUS | Status: AC
  Administered 2023-04-23: 2 g via INTRAVENOUS
  Filled 2023-04-23: qty 50

## 2023-04-23 MED ORDER — MUPIROCIN 2 % EX OINT
TOPICAL_OINTMENT | Freq: Two times a day (BID) | CUTANEOUS | Status: DC
  Filled 2023-04-23: qty 22

## 2023-04-23 NOTE — Plan of Care (Signed)
Pt had medium size BM this morning that was all clear jelly mucous.    Problem: Fluid Volume: Goal: Hemodynamic stability will improve Outcome: Progressing   Problem: Clinical Measurements: Goal: Signs and symptoms of infection will decrease Outcome: Progressing   Problem: Respiratory: Goal: Ability to maintain adequate ventilation will improve Outcome: Progressing   Problem: Clinical Measurements: Goal: Ability to maintain clinical measurements within normal limits will improve Outcome: Progressing Goal: Will remain free from infection Outcome: Progressing   Problem: Nutrition: Goal: Adequate nutrition will be maintained Outcome: Progressing   Problem: Coping: Goal: Level of anxiety will decrease Outcome: Progressing

## 2023-04-23 NOTE — Progress Notes (Signed)
Progress Note    [name]  ZOX:[id number] DOB: [birth year]  DOA: 04/09/2023 PCP: Dione Housekeeper, MD      Brief Narrative:    Medical records reviewed and are as summarized below:  [name] is a 68 y.o. male  stage IV prostate cancer, recurrent VTE, hypertension, PVD status post AKA, decubitus ulcers, who was admitted on 1/18 with shortness of breath and was found to have recurrent DVT despite Eliquis and then Coumadin (not technically a failure as he was not bridged.)  He had been unable to afford Lovenox as an outpatient.  During hospitalization he was noted to have volume overload and was diuresed.  Stay was also complicated by ileus for stool impaction that was treated aggressively with a bowel regimen.  He was discharged in stable condition on 1/31.  He returned to the ED on 2/4 due to increasing confusion and shortness of breath.  He was found to be saturating 89% on room air.  EMS placed patient on CPAP prior to arrival.  In the ED he was found to be septic with fever of 101.9, respiratory rate 31, leukocytosis 20K, and hypoxia.  Chest x-ray revealed left basilar opacity.  He was placed on BiPAP due to work of breathing and hypoxemia.  He failed weaning trial due to work of breathing though he did have good oxygen saturations.  He was admitted for management of healthcare associated pneumonia and sepsis. Hospital stay has been complex.  CT revealed multiple septic emboli in the lungs.  Port removal was attempted early on as this was suspected nidus of infection, however his INR was supratherapeutic which prevented port removal.  He did receive low-dose vitamin K on 2/7 and ultimately was able to have port removed on 2/9.  The tip culture was positive for MRSA infection.  He underwent TEE on 2/11.  He had further respiratory compromise throughout admission and was on prolonged BiPAP.  Further respiratory compromise appeared to be secondary to acute CHF  exacerbation.  He was treated with IV diuresis and had more than 15 L net fluid loss since admission.  Respiratory failure has improved and he has been weaned off of oxygen.   2/12: Patient currently stable, does not want to go to SNF, asking to complete IV antibiotics and then go home, stating that he had everything in place at home to help.  TEE was negative, per ID will need 4 weeks of antibiotics.   2/13: Continue to improve.  Will complete IV vancomycin seen on 2/23 before leaving home with home health.  Patient will need 2 more weeks of linezolid afterwards.  ID is tapering home Wellbutrin so he can get linezolid.   Chart reviewed   Assessment/Plan:   Principal Problem:   MRSA bacteremia Active Problems:   HAP (hospital-acquired pneumonia)   Severe sepsis (HCC)   PE (pulmonary thromboembolism) (HCC)   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   Ogilvie syndrome   AKI (acute kidney injury) (HCC)   Prostate cancer metastatic to bone [facility])   Essential hypertension   Pulmonary infiltrates   Septic embolism (HCC)   Palliative care encounter   Acute respiratory failure with hypoxia (HCC)   Morbid obesity (HCC)     Body mass index is 62.94 kg/m.  (Morbid obesity)    Severe sepsis, MRSA bacteremia, pneumonia, septic pulmonary emboli: Continue IV vancomycin through 04/28/2023.  This will be followed by 2 weeks of oral linezolid. Wellbutrin is  being tapered off to allow linezolid use in the near future. Port-A-Cath was removed on 04/14/2023 and tip culture was positive for MRSA. TEE was negative for vegetation   Acute on chronic diastolic CHF: S/p treatment with IV Lasix.  Continue oral Lasix.   Pulmonary embolism, history of recurrent venous thromboembolism while on Eliquis: Continue warfarin and monitor INR.   Acute urinary retention: Resolved.  Foley catheter was successfully removed on 04/19/2023.    Hypokalemia: Improved.  Potassium is low normal so we will  continue potassium repletion Hypomagnesemia: Replete with IV magnesium sulfate.   Stage IV prostate cancer with metastasis to the bones: Analgesics as needed for pain.  Follow-up with oncologist as an outpatient.   Bilateral upper extremity weakness: Improving. This is likely from deconditioning. Continue PT and OT as able.     Acute hypoxic respiratory failure, acute kidney injury, acute metabolic encephalopathy: Resolved Completed prednisone taper on 04/22/2023.   Bilateral groin pain: Bilateral hip and pelvic x-ray on 04/20/2023 showed chronic soft tissue calcification of bilateral proximal thighs, degenerative changes of the lumbar spine and underlying osseous metastatic disease.   Diarrhea: Of note, patient had not received Imodium that was started on 04/21/2023.  Schedule Imodium today and use Imodium as needed from tomorrow.    MiraLAX and lactulose were discontinued on 04/20/2023.   Patient reported broken tooth on 04/22/2023, possibly one of the right upper molar teeth: recommended outpatient follow-up with a dentist.  He said there is a dental office close to where he lives.  He plans to make an appointment.   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, history of Ogilvie syndrome (was on lactulose for this), anemia of chronic disease      Diet Order             Diet regular Fluid consistency: Thin  Diet effective now                            Consultants: Cardiologist ID specialist Palliative care   Procedures: TEE on 04/16/2023 (no vegetations noted) Port-A-Cath removal by IR on 04/14/2023    Medications:    acetaminophen  650 mg Oral Q6H   buPROPion  75 mg Oral Daily   Followed by   Melene Muller ON 04/25/2023] buPROPion  50 mg Oral Daily   dicyclomine  10 mg Oral TID AC & HS   Fe Fum-Vit C-Vit B12-FA  1 capsule Oral QPC breakfast   furosemide  20 mg Oral Daily   gabapentin  200 mg Oral TID    leptospermum manuka honey  1 Application Topical Daily   lidocaine  1 patch Transdermal Q24H   loperamide  2 mg Oral TID   mupirocin ointment   Nasal BID   nystatin cream   Topical BID   potassium chloride  40 mEq Oral Once   warfarin  6 mg Oral ONCE-1600   Warfarin - Pharmacist Dosing Inpatient   Does not apply q1600   Continuous Infusions:  magnesium sulfate bolus IVPB     vancomycin 750 mg (04/23/23 0533)     Anti-infectives (From admission, onward)    Start     Dose/Rate Route Frequency Ordered Stop   04/18/23 0900  vancomycin (VANCOREADY) IVPB 750 mg/150 mL        750 mg 75 mL/hr over 120 Minutes Intravenous Every 12 hours 04/18/23 0818     04/15/23 0800  vancomycin (VANCOREADY)  IVPB 1500 mg/300 mL  Status:  Discontinued        1,500 mg 100 mL/hr over 180 Minutes Intravenous Every 24 hours 04/15/23 0649 04/18/23 0818   04/13/23 1030  vancomycin variable dose per unstable renal function (pharmacist dosing)  Status:  Discontinued         Does not apply See admin instructions 04/13/23 1031 04/15/23 0711   04/12/23 1000  vancomycin (VANCOREADY) IVPB 1500 mg/300 mL  Status:  Discontinued        1,500 mg 100 mL/hr over 180 Minutes Intravenous Every 24 hours 04/12/23 0831 04/13/23 1031   04/11/23 1000  vancomycin (VANCOREADY) IVPB 1500 mg/300 mL        1,500 mg 75 mL/hr over 240 Minutes Intravenous  Once 04/11/23 0828 04/11/23 1358   04/10/23 0308  vancomycin variable dose per unstable renal function (pharmacist dosing)  Status:  Discontinued         Does not apply See admin instructions 04/10/23 0308 04/12/23 0831   04/10/23 0145  vancomycin (VANCOREADY) IVPB 1500 mg/300 mL        1,500 mg 100 mL/hr over 180 Minutes Intravenous  Once 04/10/23 0131 04/10/23 0546   04/10/23 0130  vancomycin (VANCOCIN) IVPB 1000 mg/200 mL premix  Status:  Discontinued        1,000 mg 200 mL/hr over 60 Minutes Intravenous  Once 04/10/23 0120 04/10/23 0131   04/09/23 2100  vancomycin (VANCOCIN)  IVPB 1000 mg/200 mL premix        1,000 mg 200 mL/hr over 60 Minutes Intravenous  Once 04/09/23 2047 04/10/23 0113   04/09/23 2100  ceFEPIme (MAXIPIME) 2 g in sodium chloride 0.9 % 100 mL IVPB        2 g 200 mL/hr over 30 Minutes Intravenous  Once 04/09/23 2047 04/10/23 0113              Family Communication/Anticipated D/C date and plan/Code Status   DVT prophylaxis:  warfarin (COUMADIN) tablet 6 mg     Code Status: Limited: Do not attempt resuscitation (DNR) -DNR-LIMITED -Do Not Intubate/DNI   Family Communication: None Disposition Plan: Plan to discharge home   Status is: Inpatient Remains inpatient appropriate because: IV antibiotics for bacteremia       Subjective:   Interval events noted.  He complains of "clear watery stools".  No fever, vomiting or abdominal pain.  He is no longer worried about his broken tooth.  Objective:    Vitals:   04/22/23 1548 04/22/23 2047 04/23/23 0612 04/23/23 0948  BP: 115/66 129/70 119/66 (!) 109/55  Pulse: 65 66 60 72  Resp: 19 18 18 18   Temp: 99.5 F (37.5 C) 98.8 F (37.1 C) 97.9 F (36.6 C) 98.8 F (37.1 C)  TempSrc: Oral Oral    SpO2: 98% 98% 98% 96%  Weight:      Height:       No data found.   Intake/Output Summary (Last 24 hours) at 04/23/2023 1413 Last data filed at 04/23/2023 1058 Gross per 24 hour  Intake 1268.33 ml  Output 900 ml  Net 368.33 ml   Filed Weights   04/09/23 2019 04/19/23 1555  Weight: 136 kg (!) 136.6 kg    Exam:  GEN: NAD SKIN: Warm and dry EYES: No pallor or icterus ENT: MMM CV: RRR PULM: CTA B ABD: soft, obese, NT, +BS CNS: AAO x 3, non focal EXT: Bilateral AKA     Data Reviewed:   I have personally reviewed following labs  and imaging studies:  Labs: Labs show the following:   Basic Metabolic Panel: Recent Labs  Lab 04/17/23 0422 04/18/23 0642 04/19/23 0650 04/20/23 0451 04/23/23 0315  NA 138 140 139 141 138  K 2.7* 3.1* 3.3* 3.5 3.5  CL 106 107 108  111 106  CO2 24 26 25 24 24   GLUCOSE 95 91 90 111* 81  BUN 31* 21 16 16 16   CREATININE 0.78 0.72 0.65 0.64 0.76  CALCIUM 9.2 9.3 9.1 9.2 8.7*  MG 1.6* 1.9 1.7 1.7 1.6*  PHOS 2.6 2.9 2.8 2.8  --    GFR Estimated Creatinine Clearance: 103.8 mL/min (by C-G formula based on SCr of 0.76 mg/dL). Liver Function Tests: Recent Labs  Lab 04/17/23 0422 04/18/23 0642 04/19/23 0650 04/20/23 0451  AST 30 24 29  40  ALT 35 30 36 44  ALKPHOS 59 58 58 61  BILITOT 0.7 0.6 0.5 0.6  PROT 5.9* 5.8* 6.0* 5.8*  ALBUMIN 2.7* 2.6* 2.6* 2.5*   No results for input(s): "LIPASE", "AMYLASE" in the last 168 hours. No results for input(s): "AMMONIA" in the last 168 hours. Coagulation profile Recent Labs  Lab 04/19/23 0650 04/20/23 0451 04/21/23 0437 04/22/23 0621 04/23/23 0315  INR 3.0* 3.1* 2.5* 2.4* 2.1*    CBC: Recent Labs  Lab 04/17/23 0422 04/18/23 1610 04/19/23 0650 04/20/23 0451 04/21/23 0437 04/22/23 0621  WBC 9.0 9.0 9.8 10.3 10.0 8.2  NEUTROABS 7.0 7.0 8.1* 8.6*  --   --   HGB 10.8* 10.1* 9.7* 9.5* 9.7* 10.1*  HCT 32.1* 31.4* 29.7* 29.2* 29.2* 31.0*  MCV 91.7 94.3 93.4 93.6 91.5 93.7  PLT 356 319 271 240 250 254   Cardiac Enzymes: No results for input(s): "CKTOTAL", "CKMB", "CKMBINDEX", "TROPONINI" in the last 168 hours. BNP (last 3 results) No results for input(s): "PROBNP" in the last 8760 hours. CBG: Recent Labs  Lab 04/19/23 0815 04/19/23 1240  GLUCAP 74 118*   D-Dimer: No results for input(s): "DDIMER" in the last 72 hours. Hgb A1c: No results for input(s): "HGBA1C" in the last 72 hours. Lipid Profile: No results for input(s): "CHOL", "HDL", "LDLCALC", "TRIG", "CHOLHDL", "LDLDIRECT" in the last 72 hours. Thyroid function studies: No results for input(s): "TSH", "T4TOTAL", "T3FREE", "THYROIDAB" in the last 72 hours.  Invalid input(s): "FREET3" Anemia work up: No results for input(s): "VITAMINB12", "FOLATE", "FERRITIN", "TIBC", "IRON", "RETICCTPCT" in the last  72 hours. Sepsis Labs: Recent Labs  Lab 04/19/23 0650 04/20/23 0451 04/21/23 0437 04/22/23 0621  WBC 9.8 10.3 10.0 8.2    Microbiology Recent Results (from the past 240 hours)  Cath Tip Culture     Status: Abnormal   Collection Time: 04/14/23 12:00 PM   Specimen: Catheter Tip; Other  Result Value Ref Range Status   Specimen Description   Final    CATH TIP Performed at [facility], [address] [place], [place] [address]    Special Requests   Final    NONE Performed at [facility], [address] [place], [place] [address]    Culture (A)  Final    >=100,000 COLONIES/mL METHICILLIN RESISTANT STAPHYLOCOCCUS AUREUS   Report Status 04/17/2023 FINAL  Final   Organism ID, Bacteria METHICILLIN RESISTANT STAPHYLOCOCCUS AUREUS (A)  Final      Susceptibility   Methicillin resistant staphylococcus aureus - MIC*    CIPROFLOXACIN >=8 RESISTANT Resistant     ERYTHROMYCIN >=8 RESISTANT Resistant     GENTAMICIN <=0.5 SENSITIVE Sensitive     OXACILLIN >=4 RESISTANT  Resistant     TETRACYCLINE <=1 SENSITIVE Sensitive     VANCOMYCIN 1 SENSITIVE Sensitive     TRIMETH/SULFA <=10 SENSITIVE Sensitive     CLINDAMYCIN <=0.25 SENSITIVE Sensitive     RIFAMPIN <=0.5 SENSITIVE Sensitive     Inducible Clindamycin NEGATIVE Sensitive     LINEZOLID 2 SENSITIVE Sensitive     * >=100,000 COLONIES/mL METHICILLIN RESISTANT STAPHYLOCOCCUS AUREUS  Culture, blood (Routine X 2) w Reflex to ID Panel     Status: None   Collection Time: 04/15/23 10:05 PM   Specimen: BLOOD  Result Value Ref Range Status   Specimen Description BLOOD BLOOD RIGHT ARM  Final   Special Requests   Final    BOTTLES DRAWN AEROBIC AND ANAEROBIC Blood Culture adequate volume   Culture   Final    NO GROWTH 5 DAYS Performed at [facility], [address] [place], [place] [address]    Report Status 04/20/2023 FINAL  Final  Culture, blood (Routine X 2) w Reflex to ID Panel     Status: None    Collection Time: 04/15/23 10:05 PM   Specimen: BLOOD  Result Value Ref Range Status   Specimen Description BLOOD BLOOD LEFT ARM  Final   Special Requests   Final    BOTTLES DRAWN AEROBIC AND ANAEROBIC Blood Culture adequate volume   Culture   Final    NO GROWTH 5 DAYS Performed at [facility], [address], [place], [place] [address]    Report Status 04/20/2023 FINAL  Final    Procedures and diagnostic studies:  No results found.              LOS: 13 days   Terris Bodin  [employer] Chartered loss adjuster on [email or web address]. If 7PM-7AM, please contact night-coverage at [email or web address]     04/23/2023, 2:13 PM

## 2023-04-23 NOTE — Plan of Care (Signed)
  Problem: Clinical Measurements: Goal: Diagnostic test results will improve Outcome: Progressing Goal: Signs and symptoms of infection will decrease Outcome: Progressing   Problem: Respiratory: Goal: Ability to maintain adequate ventilation will improve Outcome: Progressing   Problem: Education: Goal: Knowledge of General Education information will improve Description: Including pain rating scale, medication(s)/side effects and non-pharmacologic comfort measures Outcome: Progressing   Problem: Health Behavior/Discharge Planning: Goal: Ability to manage health-related needs will improve Outcome: Progressing   Problem: Clinical Measurements: Goal: Ability to maintain clinical measurements within normal limits will improve Outcome: Progressing Goal: Will remain free from infection Outcome: Progressing Goal: Diagnostic test results will improve Outcome: Progressing Goal: Respiratory complications will improve Outcome: Progressing Goal: Cardiovascular complication will be avoided Outcome: Progressing   Problem: Nutrition: Goal: Adequate nutrition will be maintained Outcome: Progressing   Problem: Activity: Goal: Risk for activity intolerance will decrease Outcome: Progressing   Problem: Coping: Goal: Level of anxiety will decrease Outcome: Progressing

## 2023-04-23 NOTE — Consult Note (Signed)
PHARMACY - ANTICOAGULATION CONSULT NOTE  Pharmacy Consult for Warfarin Indication: hx recurrent VTE  Patient Measurements: Height: 4\' 10"  (147.3 cm) Weight: (!) 136.2 kg (300 lb 4.8 oz) IBW/kg (Calculated) : 45.4  Labs: Recent Labs    04/09/23 2025 04/09/23 2132 04/10/23 0127 04/10/23 1051  HGB 10.9*  --   --  10.8*  HCT 31.4*  --   --  31.4*  PLT 299  --   --  229  APTT  --   --  55*  --   LABPROT  --   --  30.6*  --   INR  --   --  2.9*  --   CREATININE 3.89*  --   --  3.11*  TROPONINIHS  --  22*  --   --    Estimated Creatinine Clearance: 26.6 mL/min (A) (by C-G formula based on SCr of 3.11 mg/dL (H)).  Medical History: Past Medical History:  Diagnosis Date   Depression    History of blood clots 08/13/2011   History of left knee replacement    HTN (hypertension)    Lymphedema    lt leg-per pt   PE (pulmonary thromboembolism) (HCC)    Pressure injury of skin, unspecified injury stage, unspecified location    S/P AKA (above knee amputation) bilateral (HCC)    rt 11/24/07 and lt 01/11/17   PTA warfarin dosing: Warfarin dosing difficult to determine. Went to speak with patient who could not give a firm regimen. Base dose is 5 mg but phones in INR weekly with clinic. He estimates an average of warfarin 6 mg daily. Last dose was 2/4 morning.  Assessment: 68 y.o. 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 Patient was hospitalized last month for recurrent VTE. Patient was on warfarin at the time of that VTE, but at presentation INR was subtherapeutic at 1.2 due to lack of enoxaparin bridge, and so warfarin was seen as not a therapeutic failure. Pt has a large body habitus, and will require higher doses of warfarin. Unsure what his dose was outpatient if the clinic made any adjustments, so not sure what caused the  supratherapeutic INR. Last admission we had to give 10-15 mg doses to get INR therapeutic. No major DDI. Albumin is low at 2.8, which would allow more free warfarin.   Goal of Therapy:  INR 2-3 Monitor platelets by anticoagulation protocol: Yes  Monitoring: Date INR Warfarin Dose   2/5 2.9 6 mg  2/6 4.0 Hold  2/7 4.5 Hold, Vit K 1mg  IV for upcoming port removal   2/8 2.8 HOLD for procedure  2/9 2.1 HOLD for procedure  2/10 1.9 10 mg  2/11 1.9 10 mg  2/12 3.0 2.5 mg  2/13 3.4 5 mg  2/14 3.0 3 mg  2/15 3.1 2.5 mg  2/16 2.5 5 mg   2/17 2.4 5 mg  2/18 2.1        Plan:  INR within therapeutic range  will give warfarin 6 mg x 1 tonight  Monitor CBC daily and for signs/symptoms of bleeding; none reported by nursing  No major drug-drug interactions with current medication regimen  Monitor INR daily until stable   Kahlel Peake A, PharmD 04/23/2023 1:56 PM

## 2023-04-24 DIAGNOSIS — R7881 Bacteremia: Secondary | ICD-10-CM | POA: Diagnosis not present

## 2023-04-24 DIAGNOSIS — B9562 Methicillin resistant Staphylococcus aureus infection as the cause of diseases classified elsewhere: Secondary | ICD-10-CM | POA: Diagnosis not present

## 2023-04-24 LAB — CREATININE, SERUM
Creatinine, Ser: 0.7 mg/dL (ref 0.61–1.24)
GFR, Estimated: >60 mL/min (ref >60)

## 2023-04-24 LAB — POTASSIUM: Potassium: 3.8 mmol/L (ref 3.5–5.1)

## 2023-04-24 LAB — MAGNESIUM: Magnesium: 1.7 mg/dL (ref 1.7–2.4)

## 2023-04-24 LAB — PROTIME-INR
INR: 2.1 — ABNORMAL HIGH (ref 0.8–1.2)
Prothrombin Time: 24.1 s — ABNORMAL HIGH (ref 11.4–15.2)

## 2023-04-24 LAB — VANCOMYCIN, TROUGH: Vancomycin Tr: 11 ug/mL — ABNORMAL LOW (ref 15–20)

## 2023-04-24 MED ORDER — ACETAMINOPHEN 325 MG PO TABS
650.0000 mg | ORAL_TABLET | Freq: Four times a day (QID) | ORAL | Status: DC | PRN
  Administered 2023-04-25 – 2023-04-26 (×4): 650 mg via ORAL
  Filled 2023-04-24 (×4): qty 2

## 2023-04-24 MED ORDER — WARFARIN SODIUM 6 MG PO TABS
6.0000 mg | ORAL_TABLET | Freq: Once | ORAL | Status: AC
  Administered 2023-04-24: 6 mg via ORAL
  Filled 2023-04-24: qty 1

## 2023-04-24 MED ORDER — METHOCARBAMOL 500 MG PO TABS
500.0000 mg | ORAL_TABLET | Freq: Three times a day (TID) | ORAL | Status: DC
  Administered 2023-04-24 – 2023-04-27 (×10): 500 mg via ORAL
  Filled 2023-04-24 (×10): qty 1

## 2023-04-24 MED ORDER — MORPHINE SULFATE (PF) 2 MG/ML IV SOLN: 2.0000 mg | INTRAVENOUS | Status: DC | PRN

## 2023-04-24 MED ORDER — VANCOMYCIN HCL IN DEXTROSE 1-5 GM/200ML-% IV SOLN
1000.0000 mg | Freq: Two times a day (BID) | INTRAVENOUS | Status: DC
  Administered 2023-04-24 – 2023-04-25 (×3): 1000 mg via INTRAVENOUS
  Filled 2023-04-24 (×3): qty 200

## 2023-04-24 MED ORDER — CHOLESTYRAMINE LIGHT 4 G PO PACK
4.0000 g | PACK | Freq: Two times a day (BID) | ORAL | Status: DC
  Administered 2023-04-24 – 2023-04-25 (×4): 4 g via ORAL
  Filled 2023-04-24 (×5): qty 1

## 2023-04-24 MED ORDER — OXYCODONE HCL 5 MG PO TABS
5.0000 mg | ORAL_TABLET | ORAL | Status: DC | PRN
  Administered 2023-04-24 (×2): 10 mg via ORAL
  Filled 2023-04-24 (×2): qty 2

## 2023-04-24 MED ORDER — DIPHENHYDRAMINE HCL 12.5 MG/5ML PO ELIX
12.5000 mg | ORAL_SOLUTION | Freq: Two times a day (BID) | ORAL | Status: AC
  Administered 2023-04-24 – 2023-04-25 (×3): 12.5 mg via ORAL
  Filled 2023-04-24 (×4): qty 5

## 2023-04-24 NOTE — Progress Notes (Signed)
PROGRESS NOTE    [name]  ZOX:[id number] DOB: Oct 28, 1955 DOA: 04/09/2023 PCP: Dione Housekeeper, MD    Brief Narrative:   68 y.o. male  stage IV prostate cancer, recurrent VTE, hypertension, PVD status post AKA, decubitus ulcers, who was admitted on 1/18 with shortness of breath and was found to have recurrent DVT despite Eliquis and then Coumadin (not technically a failure as he was not bridged.)  He had been unable to afford Lovenox as an outpatient.  During hospitalization he was noted to have volume overload and was diuresed.  Stay was also complicated by ileus for stool impaction that was treated aggressively with a bowel regimen.  He was discharged in stable condition on 1/31.  He returned to the ED on 2/4 due to increasing confusion and shortness of breath.  He was found to be saturating 89% on room air.  EMS placed patient on CPAP prior to arrival.  In the ED he was found to be septic with fever of 101.9, respiratory rate 31, leukocytosis 20K, and hypoxia.  Chest x-ray revealed left basilar opacity.  He was placed on BiPAP due to work of breathing and hypoxemia.  He failed weaning trial due to work of breathing though he did have good oxygen saturations.  He was admitted for management of healthcare associated pneumonia and sepsis. Hospital stay has been complex.  CT revealed multiple septic emboli in the lungs.  Port removal was attempted early on as this was suspected nidus of infection, however his INR was supratherapeutic which prevented port removal.  He did receive low-dose vitamin K on 2/7 and ultimately was able to have port removed on 2/9.  The tip culture was positive for MRSA infection.  He underwent TEE on 2/11.  He had further respiratory compromise throughout admission and was on prolonged BiPAP.  Further respiratory compromise appeared to be secondary to acute CHF exacerbation.  He was treated with IV diuresis and had more than 15 L net fluid loss since admission.   Respiratory failure has improved and he has been weaned off of oxygen.   2/12: Patient currently stable, does not want to go to SNF, asking to complete IV antibiotics and then go home, stating that he had everything in place at home to help.  TEE was negative, per ID will need 4 weeks of antibiotics.   2/13: Continue to improve.  Will complete IV vancomycin seen on 2/23 before leaving home with home health.  Patient will need 2 more weeks of linezolid afterwards.  ID is tapering home Wellbutrin so he can get linezolid.   Assessment & Plan:   Principal Problem:   MRSA bacteremia Active Problems:   HAP (hospital-acquired pneumonia)   Severe sepsis (HCC)   PE (pulmonary thromboembolism) (HCC)   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   Ogilvie syndrome   AKI (acute kidney injury) (HCC)   Prostate cancer metastatic to bone [facility])   Essential hypertension   Pulmonary infiltrates   Septic embolism (HCC)   Palliative care encounter   Acute respiratory failure with hypoxia (HCC)   Morbid obesity (HCC)  Severe sepsis, MRSA bacteremia, pneumonia, septic pulmonary emboli: Continue IV vancomycin through 04/28/2023.  This will be followed by 2 weeks of oral linezolid. Wellbutrin is being tapered off to allow linezolid use in the near future. Port-A-Cath was removed on 04/14/2023 and tip culture was positive for MRSA. TEE was negative for vegetation     Acute on chronic diastolic CHF: S/p  treatment with IV Lasix.  Continue oral Lasix daily     Pulmonary embolism, history of recurrent venous thromboembolism while on Eliquis: Continue warfarin and monitor INR daily     Acute urinary retention: Resolved.  Foley catheter was successfully removed on 04/19/2023.      Hypokalemia: Improved.  Replace as necessary Hypomagnesemia: Monitor and replace as necessary     Stage IV prostate cancer with metastasis to the bones: Patient endorsing left lower quadrant abdominal pain.  Felt to be  muscular in nature.  Potentially bone pain in the setting of metastatic prostate CA.  Multimodal pain regimen.  If pain persist we will order dedicated imaging.     Bilateral upper extremity weakness: Improving. This is likely from deconditioning. Continue PT and OT as able.       Acute hypoxic respiratory failure, acute kidney injury, acute metabolic encephalopathy: Resolved Completed prednisone taper on 04/22/2023.     Bilateral groin pain: Bilateral hip and pelvic x-ray on 04/20/2023 showed chronic soft tissue calcification of bilateral proximal thighs, degenerative changes of the lumbar spine and underlying osseous metastatic disease.     Diarrhea: Unclear etiology.  Could be related to antibiotic exposure.  Low suspicion for C. difficile given lack of fever, normal active bowel sounds significant leukocytosis.  Will check GI PCR panel.  Start twice daily Questran.  Morbid obesity BMI 62.94.  Complicating factor in overall care and prognosis.   DVT prophylaxis: Warfarin Code Status: DNR Family Communication:None Disposition Plan: Status is: Inpatient Remains inpatient appropriate because: Completion of antibiotic course   Level of care: Med-Surg  Consultants:  Infectious disease  Procedures:  None  Antimicrobials: Vancomycin   Subjective: [name].  Resting comfortably in bed.  No visible distress.  Endorses left lower quadrant abdominal pain.  Endorses intermittent diarrhea.  Objective: Vitals:   04/23/23 2100 04/24/23 0618 04/24/23 0656 04/24/23 0927  BP: 110/62 98/62 (!) 95/56 (!) 98/54  Pulse:  68 66 73  Resp: 19 19 20 20   Temp: 99.2 F (37.3 C) (!) 100.5 F (38.1 C) 99.8 F (37.7 C) 99.1 F (37.3 C)  TempSrc: Oral     SpO2: 97% 98% 96% 96%  Weight:      Height:        Intake/Output Summary (Last 24 hours) at 04/24/2023 1204 Last data filed at 04/24/2023 1100 Gross per 24 hour  Intake 914.26 ml  Output 800 ml  Net 114.26 ml   Filed Weights   04/09/23  2019 04/19/23 1555  Weight: 136 kg (!) 136.6 kg    Examination:  General exam: Appears calm and comfortable  Respiratory system: Clear to auscultation. Respiratory effort normal. Cardiovascular system: S1-S2, RRR, no murmurs, no pedal edema Gastrointestinal system: Obese, soft, tender to palpation left lower quadrant, normal active bowel sounds Central nervous system: Alert and oriented. No focal neurological deficits. Extremities: Symmetric 5 x 5 power. Skin: No rashes, lesions or ulcers Psychiatry: Judgement and insight appear normal. Mood & affect appropriate.     Data Reviewed: I have personally reviewed following labs and imaging studies  CBC: Recent Labs  Lab 04/18/23 0642 04/19/23 0650 04/20/23 0451 04/21/23 0437 04/22/23 0621  WBC 9.0 9.8 10.3 10.0 8.2  NEUTROABS 7.0 8.1* 8.6*  --   --   HGB 10.1* 9.7* 9.5* 9.7* 10.1*  HCT 31.4* 29.7* 29.2* 29.2* 31.0*  MCV 94.3 93.4 93.6 91.5 93.7  PLT 319 271 240 250 254   Basic Metabolic Panel: Recent Labs  Lab 04/18/23 0642 04/19/23  1610 04/20/23 0451 04/23/23 0315 04/24/23 0448  NA 140 139 141 138  --   K 3.1* 3.3* 3.5 3.5 3.8  CL 107 108 111 106  --   CO2 26 25 24 24   --   GLUCOSE 91 90 111* 81  --   BUN 21 16 16 16   --   CREATININE 0.72 0.65 0.64 0.76 0.70  CALCIUM 9.3 9.1 9.2 8.7*  --   MG 1.9 1.7 1.7 1.6* 1.7  PHOS 2.9 2.8 2.8  --   --    GFR: Estimated Creatinine Clearance: 103.8 mL/min (by C-G formula based on SCr of 0.7 mg/dL). Liver Function Tests: Recent Labs  Lab 04/18/23 9604 04/19/23 0650 04/20/23 0451  AST 24 29 40  ALT 30 36 44  ALKPHOS 58 58 61  BILITOT 0.6 0.5 0.6  PROT 5.8* 6.0* 5.8*  ALBUMIN 2.6* 2.6* 2.5*   No results for input(s): "LIPASE", "AMYLASE" in the last 168 hours. No results for input(s): "AMMONIA" in the last 168 hours. Coagulation Profile: Recent Labs  Lab 04/20/23 0451 04/21/23 0437 04/22/23 0621 04/23/23 0315 04/24/23 0448  INR 3.1* 2.5* 2.4* 2.1* 2.1*    Cardiac Enzymes: No results for input(s): "CKTOTAL", "CKMB", "CKMBINDEX", "TROPONINI" in the last 168 hours. BNP (last 3 results) No results for input(s): "PROBNP" in the last 8760 hours. HbA1C: No results for input(s): "HGBA1C" in the last 72 hours. CBG: Recent Labs  Lab 04/19/23 0815 04/19/23 1240  GLUCAP 74 118*   Lipid Profile: No results for input(s): "CHOL", "HDL", "LDLCALC", "TRIG", "CHOLHDL", "LDLDIRECT" in the last 72 hours. Thyroid Function Tests: No results for input(s): "TSH", "T4TOTAL", "FREET4", "T3FREE", "THYROIDAB" in the last 72 hours. Anemia Panel: No results for input(s): "VITAMINB12", "FOLATE", "FERRITIN", "TIBC", "IRON", "RETICCTPCT" in the last 72 hours. Sepsis Labs: No results for input(s): "PROCALCITON", "LATICACIDVEN" in the last 168 hours.  Recent Results (from the past 240 hours)  Culture, blood (Routine X 2) w Reflex to ID Panel     Status: None   Collection Time: 04/15/23 10:05 PM   Specimen: BLOOD  Result Value Ref Range Status   Specimen Description BLOOD BLOOD RIGHT ARM  Final   Special Requests   Final    BOTTLES DRAWN AEROBIC AND ANAEROBIC Blood Culture adequate volume   Culture   Final    NO GROWTH 5 DAYS Performed at [name], [address], [place], [place] [address]    Report Status 04/20/2023 FINAL  Final  Culture, blood (Routine X 2) w Reflex to ID Panel     Status: None   Collection Time: 04/15/23 10:05 PM   Specimen: BLOOD  Result Value Ref Range Status   Specimen Description BLOOD BLOOD LEFT ARM  Final   Special Requests   Final    BOTTLES DRAWN AEROBIC AND ANAEROBIC Blood Culture adequate volume   Culture   Final    NO GROWTH 5 DAYS Performed at [facility], [address] [place], [place] [address]    Report Status 04/20/2023 FINAL  Final         Radiology Studies: No results found.      Scheduled Meds:  [START ON 04/25/2023] buPROPion  50 mg Oral Daily   cholestyramine light  4 g  Oral BID   dicyclomine  10 mg Oral TID AC & HS   Fe Fum-Vit C-Vit B12-FA  1 capsule Oral QPC breakfast   furosemide  20 mg Oral Daily   gabapentin  200 mg  Oral TID   leptospermum manuka honey  1 Application Topical Daily   lidocaine  1 patch Transdermal Q24H   methocarbamol  500 mg Oral TID   mupirocin ointment   Nasal BID   nystatin cream   Topical BID   warfarin  6 mg Oral ONCE-1600   Warfarin - Pharmacist Dosing Inpatient   Does not apply q1600   Continuous Infusions:  vancomycin       LOS: 14 days      Tresa Moore, MD [facility]   If 7PM-7AM, please contact night-coverage  04/24/2023, 12:04 PM

## 2023-04-24 NOTE — TOC Progression Note (Signed)
Transition of Care [facility]) - Progression Note    Patient Details  Name: [name] MRN: [id number] Date of Birth: [birth year]  Transition of [facility]) CM/SW Contact  Garret Reddish, RN Phone Number: 04/24/2023, 3:29 PM  Clinical Narrative:    Chart reviewed.  I have spoken patient about PT recommendation for home health PT/OT on discharge.  Patient did not have a home health preference.  I have asked [place] with [facility] to accept home health referral.    Noted that patient will need a Bariatric Hospital bed on discharge. Patient will have completed antibiotics on February 23 rd 2025.  Will order hospital bed closer to discharge.  TOC will continue to follow for discharge planning.          Expected Discharge Plan and Services                                               Social Determinants of Health (SDOH) Interventions SDOH Screenings   Food Insecurity: No Food Insecurity (04/11/2023)  Housing: Low Risk  (04/11/2023)  Transportation Needs: No Transportation Needs (04/11/2023)  Utilities: Not At Risk (04/11/2023)  Financial Resource Strain: High Risk (07/28/2021)   Received from [facility], [facility]  Physical Activity: Sufficiently Active (07/28/2021)   Received from [facility], [facility]  Social Connections: Moderately Integrated (04/11/2023)  Stress: Stress Concern Present (08/01/2021)   Received from [facility], [facility]  Tobacco Use: Medium Risk (04/20/2023)    Readmission Risk Interventions    03/25/2023   10:53 AM  Readmission Risk Prevention Plan  Transportation Screening Complete  PCP or Specialist Appt within 5-7 Days Complete  Home Care Screening Complete  Medication Review (RN CM) Complete

## 2023-04-24 NOTE — Progress Notes (Signed)
Pharmacy - Brief Note  While discussing patient with infectious diseases physician, she noted upper chest erythema on exam .  concern for vancomycin infusion reaction as he does have history of this.    Plan:  add benadryl 12.5mg  po BID prior to next 3 vanco doses  Extend vancomycin infusion to be given over 3h.    Juliette Alcide, PharmD, BCPS, BCIDP Work Cell: [phone] 04/24/2023 3:10 PM

## 2023-04-24 NOTE — Progress Notes (Addendum)
Date of Admission:  04/09/2023   T ID: [name] is a 68 y.o. male  Principal Problem:   MRSA bacteremia Active Problems:   Prostate cancer metastatic to bone [facility])   PE (pulmonary thromboembolism) (HCC)   Essential hypertension   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   Ogilvie syndrome   HAP (hospital-acquired pneumonia)   Severe sepsis (HCC)   AKI (acute kidney injury) (HCC)   Pulmonary infiltrates   Septic embolism (HCC)   Palliative care encounter   Acute respiratory failure with hypoxia (HCC)   Morbid obesity (HCC)    Subjective: Pt is doing okay I noticed redness on his neck and he says he was itching 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  Medications:   [START ON 04/25/2023] buPROPion  50 mg Oral Daily   cholestyramine light  4 g Oral BID   dicyclomine  10 mg Oral TID AC & HS   Fe Fum-Vit C-Vit B12-FA  1 capsule Oral QPC breakfast   furosemide  20 mg Oral Daily   gabapentin  200 mg Oral TID   leptospermum manuka honey  1 Application Topical Daily   lidocaine  1 patch Transdermal Q24H   methocarbamol  500 mg Oral TID   mupirocin ointment   Nasal BID   nystatin cream   Topical BID   Warfarin - Pharmacist Dosing Inpatient   Does not apply q1600    Objective: Vital signs in last 24 hours: Patient Vitals for the past 24 hrs:  BP Temp Temp src Pulse Resp SpO2  04/24/23 0927 (!) 98/54 99.1 F (37.3 C) -- 73 20 96 %  04/24/23 0656 (!) 95/56 99.8 F (37.7 C) -- 66 20 96 %  04/24/23 0618 98/62 (!) 100.5 F (38.1 C) -- 68 19 98 %  04/23/23 2100 110/62 99.2 F (37.3 C) Oral -- 19 97 %  04/23/23 1647 104/62 98.8 F (37.1 C) -- 71 18 97 %      PHYSICAL EXAM:  General:awake and alert, no resp distress Lungs: Bilateral air entry Heart: S1-S2. Abdomen: Soft, non-tender,not distended. Bowel sounds normal. No masses Extremities: Bilateral AKA  skin: erfythema around neck With some papular eruprion  Lymph: Cervical, supraclavicular  normal. Neurologic: Grossly non-focal  Lab Results    Latest Ref Rng & Units 04/22/2023    6:21 AM 04/21/2023    4:37 AM 04/20/2023    4:51 AM  CBC  WBC 4.0 - 10.5 K/uL 8.2  10.0  10.3   Hemoglobin 13.0 - 17.0 g/dL 11.9  9.7  9.5   Hematocrit 39.0 - 52.0 % 31.0  29.2  29.2   Platelets 150 - 400 K/uL 254  250  240        Latest Ref Rng & Units 04/24/2023    4:48 AM 04/23/2023    3:15 AM 04/20/2023    4:51 AM  CMP  Glucose 70 - 99 mg/dL  81  147   BUN 8 - 23 mg/dL  16  16   Creatinine 8.29 - 1.24 mg/dL 5.62  1.30  8.65   Sodium 135 - 145 mmol/L  138  141   Potassium 3.5 - 5.1 mmol/L 3.8  3.5  3.5   Chloride 98 - 111 mmol/L  106  111   CO2 22 - 32 mmol/L  24  24   Calcium 8.9 - 10.3 mg/dL  8.7  9.2   Total Protein 6.5 - 8.1 g/dL  5.8   Total Bilirubin 0.0 - 1.2 mg/dL   0.6   Alkaline Phos 38 - 126 U/L   61   AST 15 - 41 U/L   40   ALT 0 - 44 U/L   44       Microbiology: BC MRSA on 04/09/23 both sets 2/7 BC- NG 2/9 cath tip  MRSA 2/10 BC No growth   Assessment/Plan:  MRSA bacteremia With port in place and multiple nodules in the lungs concern for septic emboli and endocarditis of tricuspid valve  repeat blood culture from 2/7 NG PORT  removed  04/14/23 and culture is MRSA TEE neg for endocarditis  On vancomycin adjusted to crcl Watch closely because of AKI Daptomycin is currently not an option because of  lung infiltrate Will need 4 weeks of antibiotic, after 2 weeks IV vanco 04/28/23 can switch to PO linezolid 600mg  PO BID for 2 more week 05/12/23. Pt will need monitoring of cbc while on linezolid On 2/28 will need CBC/CMP I will ask his oncologist to check his labs as I will be away then  Wellbutrin is being  tapered and stopped  because of potential risk of serotonin syndrome with linezolid- Though It is a lower risk for serotonin syndrome compared to  SSRI /SNRI he was on a high dose of 300mg   a long acting form.  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

## 2023-04-24 NOTE — Progress Notes (Deleted)
Pharmacy Antibiotic Note  [name] is a 68 y.o. male admitted on 04/09/2023 with MRSA bacteremia.  Pharmacy has been consulted for Vancomycin dosing.  Patient with recent admission for recurrent DVT, discharged 1/31.  Comes to ED 2/4 with confusion and shortness of breath.  CT chest/abdomen/pelvis lung findings concerning for septic pulmonary emboli.  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 ID consulted.   Today, 04/24/2023 Day #15 vancomycin Renal: AKI - resolved, SCr 0.70 mg/dl ZO/10R 604.5 WBC WNL 2/4 blood culture: 3/4 bottles MRSA 2/5 Urine cx: NG Respiratory panel and respiratory virus panel neg 2/5 C difficile neg Rule out endocarditis with concern for septic pulmonary embolism on CT 2/5 ECHO: no endocarditis 2/7 repeat blood cultures: NG<24 hrs 2/9 Port removed - tip culture growing MRSA 2/10 repeat blood cultures: NGTD 2/11 TEE no endocarditis  Vancomycin dosing/levels: Vancomycin doses given 2/4 1gm IV x 1 at 2356 then 2/5 1500mg  IV  x1 2/6 random vancomycin level at 0603 = 11 mcg/mL 2/6 vancomycin 1500mg  IV x 1 given at 0947, 2/7 random vancomycin level 0553 = 16 mcg/mL 2/7 Vancomycin 1500 mg given 2/7@1046  2/8 vancomycin 1500 mg given 2/8 @0836  2/9 random vancomycin level @0502  = 23 mcg/ml,  no dose ordered 2/10 random vancomycin 12. Last dose 2/8 @0836  (1500mg  ) 2/11 random vancomycin  = 11 MCG/Ml AT 06:03, previous dose vanco 1500mg  given 2/10 at 11:00 Vancomycin dose 1500mg  IV q24h, dose given at 2/12 at 10:04 over 3hr infusion (level with 3rd dose of 1500mg  q24h) Vancomycin peak = 32 mcg/mL 2/12 at 14:20 Vancomycin random = 11 mcg/ml 2/13 at 06:42 AUC = 464, Cmax 34.8 mcg/ml and Cmin 8.8 mcg/mL 2/19 vancomycin dose 750mg  IV q12h with previous dose given 2/18 at 16:58 (given 1 hr early) Vancomycin trough = 11  mcg/mL at 04:48  Plan: Increase vancomycin to 1gm IV q12h (infuse each dose over 2h) New estimated trough = 14.7 mcg/ml with expected AUC in the 480-500 range (goal 400-600) Note: history of infusion related reaction with vancomycin - slow infusion Anticipate  some difficulty dosing vancomycin with PMH Bilateral AKA - effect on volume of distribution and SCr (loss of muscle mass).   Follow renal function closely Plan to check vancomycin level abd Scr 2/18 ID planning vancomycin until 04/28/23 then complete two more weeks of antibiotic therapy with linezolid 600mg  PO BID (estimated copay $68)  Daptomycin- not currently option with septic PE, Pneumonia  Temp (24hrs), Avg:99.4 F (37.4 C), Min:98.8 F (37.1 C), Max:100.5 F (38.1 C)   Recent Labs  Lab 04/17/23 1420 04/18/23 0642 04/19/23 0650 04/20/23 0451 04/21/23 0437 04/22/23 0621 04/23/23 0315 04/24/23 0448  WBC  --  9.0 9.8 10.3 10.0 8.2  --   --   CREATININE  --  0.72 0.65 0.64  --   --  0.76 0.70  VANCOTROUGH  --   --   --   --   --   --   --  11*  VANCOPEAK 32  --   --   --   --   --   --   --   VANCORANDOM  --  11  --   --   --   --   --   --     Estimated Creatinine Clearance: 103.8 mL/min (by C-G formula based on SCr of 0.7 mg/dL).  Allergies  Allergen Reactions   Bacitracin Hives   Vancomycin Other (See Comments) and Rash    Red man syndrome.   Ketorolac     Other Reaction(s): Kidney Disorder  Other reaction(s): Kidney Disorder   Spironolactone     Other Reaction(s): Unknown  Other reaction(s): Unknown    Antimicrobials this admission: 2/4 Cefepime >> x 1 dose 2/4 Vancomycin >>    Thank you for allowing pharmacy to be a part of this patient's care.  Juliette Alcide, PharmD, BCPS, BCIDP Work Cell: [phone] 04/24/2023 7:48 AM

## 2023-04-24 NOTE — Plan of Care (Signed)
  Problem: Fluid Volume: Goal: Hemodynamic stability will improve Outcome: Progressing   Problem: Clinical Measurements: Goal: Diagnostic test results will improve Outcome: Progressing   Problem: Respiratory: Goal: Ability to maintain adequate ventilation will improve Outcome: Progressing   Problem: Clinical Measurements: Goal: Ability to maintain clinical measurements within normal limits will improve Outcome: Progressing Goal: Cardiovascular complication will be avoided Outcome: Progressing   Problem: Activity: Goal: Risk for activity intolerance will decrease Outcome: Progressing

## 2023-04-24 NOTE — Consult Note (Signed)
PHARMACY - ANTICOAGULATION CONSULT NOTE  Pharmacy Consult for Warfarin Indication: hx recurrent VTE  Patient Measurements: Height: 4\' 10"  (147.3 cm) Weight: (!) 136.2 kg (300 lb 4.8 oz) IBW/kg (Calculated) : 45.4  Labs: Recent Labs    04/09/23 2025 04/09/23 2132 04/10/23 0127 04/10/23 1051  HGB 10.9*  --   --  10.8*  HCT 31.4*  --   --  31.4*  PLT 299  --   --  229  APTT  --   --  55*  --   LABPROT  --   --  30.6*  --   INR  --   --  2.9*  --   CREATININE 3.89*  --   --  3.11*  TROPONINIHS  --  22*  --   --    Estimated Creatinine Clearance: 26.6 mL/min (A) (by C-G formula based on SCr of 3.11 mg/dL (H)).  Medical History: Past Medical History:  Diagnosis Date   Depression    History of blood clots 08/13/2011   History of left knee replacement    HTN (hypertension)    Lymphedema    lt leg-per pt   PE (pulmonary thromboembolism) (HCC)    Pressure injury of skin, unspecified injury stage, unspecified location    S/P AKA (above knee amputation) bilateral (HCC)    rt 11/24/07 and lt 01/11/17   PTA warfarin dosing: Warfarin dosing difficult to determine. Went to speak with patient who could not give a firm regimen. Base dose is 5 mg but phones in INR weekly with clinic. He estimates an average of warfarin 6 mg daily. Last dose was 2/4 morning.  Assessment: 68 y.o. 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 Patient was hospitalized last month for recurrent VTE. Patient was on warfarin at the time of that VTE, but at presentation INR was subtherapeutic at 1.2 due to lack of enoxaparin bridge, and so warfarin was seen as not a therapeutic failure. Pt has a large body habitus, and will require higher doses of warfarin. Unsure what his dose was outpatient if the clinic made any adjustments, so not sure what caused the  supratherapeutic INR. Last admission we had to give 10-15 mg doses to get INR therapeutic. No major DDI. Albumin is low at 2.8, which would allow more free warfarin.   Goal of Therapy:  INR 2-3 Monitor platelets by anticoagulation protocol: Yes  Monitoring: Date INR Warfarin Dose   2/5 2.9 6 mg  2/6 4.0 Hold  2/7 4.5 Hold, Vit K 1mg  IV for upcoming port removal   2/8 2.8 HOLD for procedure  2/9 2.1 HOLD for procedure  2/10 1.9 10 mg  2/11 1.9 10 mg  2/12 3.0 2.5 mg  2/13 3.4 5 mg  2/14 3.0 3 mg  2/15 3.1 2.5 mg  2/16 2.5 5 mg   2/17 2.4 5 mg  2/18 2.1 6 mg  2/19 2.1        Plan:  INR within therapeutic range  will order warfarin 6 mg x 1 tonight again Monitor CBC daily and for signs/symptoms of bleeding Monitor INR daily until stable ,  CBC per protocol  Sylvanna Burggraf A, PharmD 04/24/2023 10:46 AM

## 2023-04-24 NOTE — Progress Notes (Signed)
Pharmacy Antibiotic Note  [name] is a 68 y.o. male admitted on 04/09/2023 with MRSA bacteremia.  Pharmacy has been consulted for Vancomycin dosing.  Patient with recent admission for recurrent DVT, discharged 1/31.  Comes to ED 2/4 with confusion and shortness of breath.  CT chest/abdomen/pelvis lung findings concerning for septic pulmonary emboli.  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 ID consulted.   Today, 04/24/2023 Day #15 vancomycin Renal: AKI - resolved, SCr 0.70 mg/dl ZO/10R 604.5 WBC WNL 2/4 blood culture: 3/4 bottles MRSA 2/5 Urine cx: NG Respiratory panel and respiratory virus panel neg 2/5 C difficile neg Rule out endocarditis with concern for septic pulmonary embolism on CT 2/5 ECHO: no endocarditis 2/7 repeat blood cultures: NG<24 hrs 2/9 Port removed - tip culture growing MRSA 2/10 repeat blood cultures: NGTD 2/11 TEE no endocarditis Note: 2/17 PM dose was not unclamped, dose given 2/18 at ~5:30am  Vancomycin dosing/levels: Vancomycin doses given 2/4 1gm IV x 1 at 2356 then 2/5 1500mg  IV  x1 2/6 random vancomycin level at 0603 = 11 mcg/mL 2/6 vancomycin 1500mg  IV x 1 given at 0947, 2/7 random vancomycin level 0553 = 16 mcg/mL 2/7 Vancomycin 1500 mg given 2/7@1046  2/8 vancomycin 1500 mg given 2/8 @0836  2/9 random vancomycin level @0502  = 23 mcg/ml,  no dose ordered 2/10 random vancomycin 12. Last dose 2/8 @0836  (1500mg  ) 2/11 random vancomycin  = 11 MCG/Ml AT 06:03, previous dose vanco 1500mg  given 2/10 at 11:00 Vancomycin dose 1500mg  IV q24h, dose given at 2/12 at 10:04 over 3hr infusion (level with 3rd dose of 1500mg  q24h) Vancomycin peak = 32 mcg/mL 2/12 at 14:20 Vancomycin random = 11 mcg/ml 2/13 at 06:42 AUC = 464, Cmax 34.8 mcg/ml and Cmin 8.8 mcg/mL 2/19 vancomycin dose 750mg  IV q12h with previous dose  given 2/18 at 16:58 (given 1 hr early) Vancomycin trough = 11 mcg/mL at 04:48  Plan: Increase vancomycin to 1gm IV q12h (infuse each dose over 2h) New estimated trough = 14.7 mcg/ml with expected AUC in the 480-500 range (goal 400-600) Note: history of infusion related reaction with vancomycin - slow infusion Anticipate  some difficulty dosing vancomycin with PMH Bilateral AKA - effect on volume of distribution and SCr (loss of muscle mass).   Follow renal function closely Plan to check vancomycin level abd Scr 2/18 ID planning vancomycin until 04/28/23 then complete two more weeks of antibiotic therapy with linezolid 600mg  PO BID (estimated copay $68)  Daptomycin- not currently option with septic PE, Pneumonia  Temp (24hrs), Avg:99.4 F (37.4 C), Min:98.8 F (37.1 C), Max:100.5 F (38.1 C)   Recent Labs  Lab 04/17/23 1420 04/18/23 0642 04/19/23 0650 04/20/23 0451 04/21/23 0437 04/22/23 0621 04/23/23 0315 04/24/23 0448  WBC  --  9.0 9.8 10.3 10.0 8.2  --   --   CREATININE  --  0.72 0.65 0.64  --   --  0.76 0.70  VANCOTROUGH  --   --   --   --   --   --   --  11*  VANCOPEAK 32  --   --   --   --   --   --   --   VANCORANDOM  --  11  --   --   --   --   --   --     Estimated Creatinine Clearance:  103.8 mL/min (by C-G formula based on SCr of 0.7 mg/dL).    Allergies  Allergen Reactions   Bacitracin Hives   Vancomycin Other (See Comments) and Rash    Red man syndrome.   Ketorolac     Other Reaction(s): Kidney Disorder  Other reaction(s): Kidney Disorder   Spironolactone     Other Reaction(s): Unknown  Other reaction(s): Unknown    Antimicrobials this admission: 2/4 Cefepime >> x 1 dose 2/4 Vancomycin >>    Thank you for allowing pharmacy to be a part of this patient's care.  Juliette Alcide, PharmD, BCPS, BCIDP Work Cell: [phone] 04/24/2023 7:56 AM

## 2023-04-24 NOTE — Progress Notes (Addendum)
Pt with temp of 100.76F. scheduled tylenol had just been given not too long prior to VS. Recheck of VS temp 99.76F  BP soft SBP 90s DBP 50s-60s with MAP >65

## 2023-04-24 NOTE — Progress Notes (Signed)
Occupational Therapy Treatment Patient Details Name: [name] MRN: [id number] DOB: [birth year] Today's Date: 04/24/2023   History of present illness [name] is a 67yoM who comes to [facility] on 04/09/23 c confusion, SOB. Was here in Jan with recurrent DVT. Pt admitted with sepsis 2/4 due to PNA. 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 Pt previously on hospice at home, but no longer interested in this. Anticoaglulation being held as of 2/8 for pending port removal and TEE.   OT comments  Pt awake and alert in bed upon OT arrival.  Pt confirmed he does not wish to transfer to recliner d/t high pain in groin.  Pt also verbalized limited tolerance for supine in bed d/t aggravated L groin pain.  Pt demonstrated partial supine with good demonstration of bridging for pressure relief, ability to roll R/L for sidelying pressure relief, with ability to hold at least 30 sec each direction.  Pt also able to demo clearing buttocks in upright sitting pushing self up from bed rails.  OT encouraged these pressure relief strategies at least every 1-2 hours to prevent worsening skin breakdown.  Pt receptive to use of bariatric drop arm commode (standard size currently in room but likely would be too small).  OT called for staff to help locate a bariatric commode.  Pt in agreement to use this during admission if able to locate this DME in place of using bed pan.  OT encouraged pt continue to engage in his UB Tband exercises to maintain strength for ADLs and transfers.  Pt verbalizes consistent use of Tbands daily.  Anticipate OT sign off if able to complete [facility] transfer in an upcoming session if this equipment is located.  Pt in agreement.  Pt declines OT follow up upon d/c as he states his home is fully accessible with AE and feels he's returned to baseline status now that fluid has been taken off of him.        If plan is discharge  home, recommend the following:  Assistance with cooking/housework;Help with stairs or ramp for entrance;Assist for transportation;A little help with walking and/or transfers;A little help with bathing/dressing/bathroom   Equipment Recommendations  None recommended by OT    Recommendations for Other Services      Precautions / Restrictions Precautions Precautions: Fall Precaution/Restrictions Comments: B AKA Restrictions Weight Bearing Restrictions Per Provider Order: No Other Position/Activity Restrictions: B AKA; skin breakdown on buttocks; continue to reinforce pressure relief/positional changes       Mobility Bed Mobility Overal bed mobility: Modified Independent   Rolling: Used rails, Modified independent (Device/Increase time)         General bed mobility comments: Able to fully position self on each side and hold x30+sec with use of bed rails for pressure relief.  Demonstrated ability to clear buttocks from bed with HOB elevated and use of rails to perform pressure relief from buttocks while sitting up in bed (chair push up from bed level). Patient Response: Cooperative  Transfers                   General transfer comment: Not completed this date; pt declines transfers to recliner d/t L groin pain     Balance Overall balance assessment: Needs assistance Sitting-balance support: Single extremity supported Sitting balance-Leahy Scale: Normal         Standing balance comment: N/A  ADL either performed or assessed with clinical judgement   ADL Overall ADL's : Needs assistance/impaired                           Toilet Transfer Details (indicate cue type and reason): currently using bed pan with assist; request made to search for bariatric drop arm commode to be left in pt's room (standard size currently in room)                Extremity/Trunk Assessment Upper Extremity Assessment Upper Extremity  Assessment: Overall WFL for tasks assessed   Lower Extremity Assessment Lower Extremity Assessment: Overall WFL for tasks assessed   Cervical / Trunk Assessment Cervical / Trunk Assessment: Normal    Vision       Perception     Praxis     Communication Communication Communication: No apparent difficulties   Cognition Arousal: Alert Behavior During Therapy: WFL for tasks assessed/performed                                          Cueing   Cueing Techniques: Verbal cues  Exercises Other Exercises Other Exercises: Reviewed PU prevention, recommending frequent positional changes hourly with good return demo.    Shoulder Instructions       General Comments skin breakdown on buttocks    Pertinent Vitals/ Pain       Pain Assessment Pain Assessment: 0-10 Pain Score: 8  Pain Location: buttocks and L groin with activity only; ok at rest Pain Descriptors / Indicators: Burning, Sharp Pain Intervention(s): Monitored during session, Repositioned  Home Living                                          Prior Functioning/Environment              Frequency  Min 1X/week        Progress Toward Goals  OT Goals(current goals can now be found in the care plan section)  Progress towards OT goals: Progressing toward goals  Acute Rehab OT Goals Patient Stated Goal: return home OT Goal Formulation: With patient Time For Goal Achievement: 04/29/23 Potential to Achieve Goals: Good  Plan      Co-evaluation                 AM-PAC OT "6 Clicks" Daily Activity     Outcome Measure   Help from another person eating meals?: None Help from another person taking care of personal grooming?: None Help from another person toileting, which includes using toliet, bedpan, or urinal?: A Little Help from another person bathing (including washing, rinsing, drying)?: None Help from another person to put on and taking off regular upper body  clothing?: None Help from another person to put on and taking off regular lower body clothing?: None 6 Click Score: 23    End of Session    OT Visit Diagnosis: Other abnormalities of gait and mobility (R26.89);Muscle weakness (generalized) (M62.81)   Activity Tolerance Patient tolerated treatment well   Patient Left in bed;with call bell/phone within reach   Nurse Communication          Time: 5621-3086 OT Time Calculation (min): 31 min  Charges: OT General Charges $OT Visit: 1 Visit OT Treatments $  Self Care/Home Management : 23-37 mins  Danelle Earthly, MS, OTR/L   Otis Dials 04/24/2023, 10:16 AM

## 2023-04-24 NOTE — Progress Notes (Signed)
Physical Therapy Treatment Patient Details Name: [name] MRN: [id number] DOB: 11-Jul-1955 Today's Date: 04/24/2023   History of Present Illness [name] is a 67yoM who comes to [facility] on 04/09/23 c confusion, SOB. Was here in Jan with recurrent DVT. Pt admitted with sepsis 2/4 due to PNA. 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 Pt previously on hospice at home, but no longer interested in this. Anticoaglulation being held as of 2/8 for pending port removal and TEE.    PT Comments  Met at bedside to discuss patient goals, limitations, mobility needs. Pt endorses strong confidence in performing his own mobility needs here today as well as at DC. Pt demonstrates adequate pressure relief strategies. We discuss the need to meet pain management for best tolerance of mobility. Pt denies any further PT needs at this time. PT signing off.    If plan is discharge home, recommend the following: A little help with walking and/or transfers;A little help with bathing/dressing/bathroom;Assist for transportation   Can travel by private vehicle     No  Equipment Recommendations  None recommended by PT    Recommendations for Other Services       Precautions / Restrictions Precautions Precautions: Fall Recall of Precautions/Restrictions: Intact Precaution/Restrictions Comments: B AKA Restrictions Other Position/Activity Restrictions: B AKA; skin breakdown on buttocks; continue to reinforce pressure relief/positional changes     Mobility  Bed Mobility                    Transfers                        Ambulation/Gait                   Stairs             Wheelchair Mobility     Tilt Bed    Modified Rankin (Stroke Patients Only)       Balance                                            Communication    Cognition                                         Cueing    Exercises Other Exercises Other Exercises: subjective report on mobility activity past week (reports OOB to chair 3 time, no difficulty, just painful) Other Exercises: Review of mobility needs upon DC and remaining pt concerns (none, other than pain control) Other Exercises: Review of pressure relief strategies here and post DC (appears successful and appropriate)    General Comments        Pertinent Vitals/Pain Pain Assessment Pain Assessment: 0-10 Pain Score: 7  Pain Location: buttocks and L groin with activity only; ok at rest Pain Intervention(s): Limited activity within patient's tolerance, Premedicated before session    Home Living                          Prior Function            PT Goals (current goals can now be found in the care plan section)  Acute Rehab PT Goals Patient Stated Goal: to return to PLOF PT Goal Formulation: With patient Time For Goal Achievement: 04/28/23 Potential to Achieve Goals: Poor Progress towards PT goals: Goals met/education completed, patient discharged from PT    Frequency    Min 1X/week      PT Plan      Co-evaluation              AM-PAC PT "6 Clicks" Mobility   Outcome Measure  Help needed turning from your back to your side while in a flat bed without using bedrails?: A Little Help needed moving from lying on your back to sitting on the side of a flat bed without using bedrails?: A Little Help needed moving to and from a bed to a chair (including a wheelchair)?: A Little Help needed standing up from a chair using your arms (e.g., wheelchair or bedside chair)?: Total Help needed to walk in hospital room?: Total Help needed climbing 3-5 steps with a railing? : Total 6 Click Score: 12    End of Session   Activity Tolerance: Patient tolerated treatment well;No increased pain Patient left: with call bell/phone within reach;in bed   PT Visit Diagnosis: Other  abnormalities of gait and mobility (R26.89)     Time: 0981-1914 PT Time Calculation (min) (ACUTE ONLY): 23 min  Charges:      PT General Charges $$ ACUTE PT VISIT: 1 Visit                    5:01 PM, 04/24/23 Rosamaria Lints, PT, DPT Physical Therapist - [facility]  [phone] (ASCOM)    [name] C 04/24/2023, 5:00 PM

## 2023-04-25 DIAGNOSIS — B9562 Methicillin resistant Staphylococcus aureus infection as the cause of diseases classified elsewhere: Secondary | ICD-10-CM | POA: Diagnosis not present

## 2023-04-25 DIAGNOSIS — R197 Diarrhea, unspecified: Secondary | ICD-10-CM | POA: Diagnosis not present

## 2023-04-25 DIAGNOSIS — R7881 Bacteremia: Secondary | ICD-10-CM | POA: Diagnosis not present

## 2023-04-25 DIAGNOSIS — R509 Fever, unspecified: Secondary | ICD-10-CM

## 2023-04-25 DIAGNOSIS — R21 Rash and other nonspecific skin eruption: Secondary | ICD-10-CM

## 2023-04-25 LAB — BASIC METABOLIC PANEL
Anion gap: 10 (ref 5–15)
BUN: 16 mg/dL (ref 8–23)
CO2: 24 mmol/L (ref 22–32)
Calcium: 9.3 mg/dL (ref 8.9–10.3)
Chloride: 99 mmol/L (ref 98–111)
Creatinine, Ser: 0.72 mg/dL (ref 0.61–1.24)
GFR, Estimated: >60 mL/min (ref >60)
Glucose, Bld: 112 mg/dL — ABNORMAL HIGH (ref 70–99)
Potassium: 3.8 mmol/L (ref 3.5–5.1)
Sodium: 133 mmol/L — ABNORMAL LOW (ref 135–145)

## 2023-04-25 LAB — CBC
HCT: 29.9 % — ABNORMAL LOW (ref 39.0–52.0)
Hemoglobin: 9.7 g/dL — ABNORMAL LOW (ref 13.0–17.0)
MCH: 30 pg (ref 26.0–34.0)
MCHC: 32.4 g/dL (ref 30.0–36.0)
MCV: 92.6 fL (ref 80.0–100.0)
Platelets: 248 10*3/uL (ref 150–400)
RBC: 3.23 MIL/uL — ABNORMAL LOW (ref 4.22–5.81)
RDW: 14.9 % (ref 11.5–15.5)
WBC: 10.5 10*3/uL (ref 4.0–10.5)
nRBC: 0 % (ref 0.0–0.2)

## 2023-04-25 LAB — RESP PANEL BY RT-PCR (RSV, FLU A&B, COVID)  RVPGX2
Influenza A by PCR: NEGATIVE
Influenza B by PCR: NEGATIVE
Resp Syncytial Virus by PCR: NEGATIVE
SARS Coronavirus 2 by RT PCR: NEGATIVE

## 2023-04-25 LAB — PROTIME-INR
INR: 2.2 — ABNORMAL HIGH (ref 0.8–1.2)
Prothrombin Time: 24.6 s — ABNORMAL HIGH (ref 11.4–15.2)

## 2023-04-25 LAB — MIC RESULT

## 2023-04-25 MED ORDER — LINEZOLID 600 MG PO TABS
600.0000 mg | ORAL_TABLET | Freq: Two times a day (BID) | ORAL | Status: DC
  Administered 2023-04-26 – 2023-04-27 (×3): 600 mg via ORAL
  Filled 2023-04-25 (×3): qty 1

## 2023-04-25 MED ORDER — WARFARIN SODIUM 6 MG PO TABS
6.0000 mg | ORAL_TABLET | Freq: Once | ORAL | Status: AC
  Administered 2023-04-25: 6 mg via ORAL
  Filled 2023-04-25: qty 1

## 2023-04-25 MED ORDER — DIPHENOXYLATE-ATROPINE 2.5-0.025 MG PO TABS: 1.0000 | ORAL_TABLET | Freq: Four times a day (QID) | ORAL | Status: DC | PRN

## 2023-04-25 NOTE — Plan of Care (Signed)
  Problem: Fluid Volume: Goal: Hemodynamic stability will improve Outcome: Progressing   Problem: Clinical Measurements: Goal: Diagnostic test results will improve Outcome: Progressing   Problem: Respiratory: Goal: Ability to maintain adequate ventilation will improve Outcome: Progressing   Problem: Activity: Goal: Risk for activity intolerance will decrease Outcome: Progressing   Problem: Nutrition: Goal: Adequate nutrition will be maintained Outcome: Progressing   Problem: Coping: Goal: Level of anxiety will decrease Outcome: Progressing   Problem: Pain Managment: Goal: General experience of comfort will improve and/or be controlled Outcome: Progressing   Problem: Safety: Goal: Ability to remain free from injury will improve Outcome: Progressing

## 2023-04-25 NOTE — Progress Notes (Signed)
Date of Admission:  04/09/2023   T ID: [name] is a 68 y.o. male  Principal Problem:   MRSA bacteremia Active Problems:   Prostate cancer metastatic to bone [facility])   PE (pulmonary thromboembolism) (HCC)   Essential hypertension   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   Ogilvie syndrome   HAP (hospital-acquired pneumonia)   Severe sepsis (HCC)   AKI (acute kidney injury) (HCC)   Pulmonary infiltrates   Septic embolism (HCC)   Palliative care encounter   Acute respiratory failure with hypoxia (HCC)   Morbid obesity (HCC)    Subjective: Pt says he has watery diarrhea- this has been going on for more than a month- last admission he had colonoscopy for decompression and it showed some sigmoid inflammation He has ithcing I notice a rash He has low grade fever around 100   Medications:   buPROPion  50 mg Oral Daily   cholestyramine light  4 g Oral BID   dicyclomine  10 mg Oral TID AC & HS   Fe Fum-Vit C-Vit B12-FA  1 capsule Oral QPC breakfast   furosemide  20 mg Oral Daily   gabapentin  200 mg Oral TID   leptospermum manuka honey  1 Application Topical Daily   lidocaine  1 patch Transdermal Q24H   methocarbamol  500 mg Oral TID   mupirocin ointment   Nasal BID   nystatin cream   Topical BID   Warfarin - Pharmacist Dosing Inpatient   Does not apply q1600    Objective: Vital signs in last 24 hours: Patient Vitals for the past 24 hrs:  BP Temp Temp src Pulse Resp SpO2  04/25/23 1200 -- 100 F (37.8 C) -- -- -- --  04/25/23 0825 128/70 100 F (37.8 C) -- 78 18 96 %  04/25/23 0505 97/65 99 F (37.2 C) -- 80 18 (!) 89 %  04/24/23 2106 (!) 97/51 100 F (37.8 C) Oral -- 14 97 %      PHYSICAL EXAM:  General:awake and alert, no resp distress Lungs: Bilateral air entry Heart: S1-S2. Abdomen: Soft, non-tender,not distended. Bowel sounds normal. No masses Extremities: Bilateral AKA  skin: erfythema around neck With some papular  eruprion       Lymph: Cervical, supraclavicular normal. Neurologic: Grossly non-focal Abd soft- distended B/l AKA  Lab Results    Latest Ref Rng & Units 04/25/2023    4:31 AM 04/22/2023    6:21 AM 04/21/2023    4:37 AM  CBC  WBC 4.0 - 10.5 K/uL 10.5  8.2  10.0   Hemoglobin 13.0 - 17.0 g/dL 9.7  29.5  9.7   Hematocrit 39.0 - 52.0 % 29.9  31.0  29.2   Platelets 150 - 400 K/uL 248  254  250        Latest Ref Rng & Units 04/25/2023    9:32 AM 04/24/2023    4:48 AM 04/23/2023    3:15 AM  CMP  Glucose 70 - 99 mg/dL 621   81   BUN 8 - 23 mg/dL 16   16   Creatinine 3.08 - 1.24 mg/dL 6.57  8.46  9.62   Sodium 135 - 145 mmol/L 133   138   Potassium 3.5 - 5.1 mmol/L 3.8  3.8  3.5   Chloride 98 - 111 mmol/L 99   106   CO2 22 - 32 mmol/L 24   24   Calcium 8.9 - 10.3 mg/dL 9.3  8.7       Microbiology: [facility] MRSA on 04/09/23 both sets 2/7 BC- NG 2/9 cath tip  MRSA 2/10 BC No growth 2/20 BC sent  Assessment/Plan:  MRSA bacteremia With port in place and multiple nodules in the lungs concern for septic emboli and endocarditis of tricuspid valve  repeat blood culture from 2/7 NG PORT  removed  04/14/23 and culture is MRSA TEE neg for endocarditis  On vancomycin adjusted to crcl Daptomycin is currently not an option because of  lung infiltrate Will need 4 weeks of antibiotic, after 2 weeks IV vanco 04/28/23 can switch to PO linezolid 600mg  PO BID for 2 more week 05/12/23. Pt will need monitoring of cbc while on linezolid On 2/28 will need CBC/CMP I will ask his oncologist to check his labs as I will be away then  Wellbutrin is being  tapered and stopped  because of potential risk of serotonin syndrome with linezolid- Though It is a lower risk for serotonin syndrome compared to  SSRI /SNRI he was on a high dose of 300mg   a long acting form.  Pt is doing fine on the taper   Low grade fever Erythema and  itching over neck and papular rash Red man syndrome from vanco was suspected Gave  low dose benadryl but not effective As no other explanation for low grade fever, will stop vanco and start linezolid Need to monitor for 48 hrs while on linezolid as risk for serotonin syndrome with wellbutin and it will be stopped on 2/22 ( may stop after tomorrow's dose)  Watery diarrhea Has this symptom for more than a  month 2 diff test neg 1 GI panel PCR neg Colonoscopy 1/29 study limited by poor prep There was a diagnosis of ogilvie's syndrome  ? 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

## 2023-04-25 NOTE — Progress Notes (Signed)
Patient has (Acute Respiratory failure with hypoxia, Acute on chronic CHF, and is a bilateral AKA) which requires (chest area ) to be positioned in ways not feasible with a normal bed. Head must be elevated at least (90 degrees) or (patient may experience Respiratory distress ).  (Patient has bilateral Above knee amputations (frequently / upon awakening) requires (frequent/immediate) changes in body position which cannot be achieved with a normal bed.

## 2023-04-25 NOTE — TOC Progression Note (Signed)
Transition of Care [facility]) - Progression Note    Patient Details  Name: [name] MRN: [id number] Date of Birth: 1956-02-27  Transition of Care [facility] CM/SW Contact  Garret Reddish, RN Phone Number: 04/25/2023, 3:07 PM  Clinical Narrative:     I have asked Jon with [employer] to order Bariatric Hospital bed.    TOC will continue to follow for discharge planning.        Expected Discharge Plan and Services                                               Social Determinants of Health (SDOH) Interventions SDOH Screenings   Food Insecurity: No Food Insecurity (04/11/2023)  Housing: Low Risk  (04/11/2023)  Transportation Needs: No Transportation Needs (04/11/2023)  Utilities: Not At Risk (04/11/2023)  Financial Resource Strain: High Risk (07/28/2021)   Received from [facility], [facility]  Physical Activity: Sufficiently Active (07/28/2021)   Received from [facility], [facility]  Social Connections: Moderately Integrated (04/11/2023)  Stress: Stress Concern Present (08/01/2021)   Received from [facility], [facility]  Tobacco Use: Medium Risk (04/20/2023)    Readmission Risk Interventions    03/25/2023   10:53 AM  Readmission Risk Prevention Plan  Transportation Screening Complete  PCP or Specialist Appt within 5-7 Days Complete  Home Care Screening Complete  Medication Review (RN CM) Complete

## 2023-04-25 NOTE — Progress Notes (Signed)
PROGRESS NOTE    [name]  VWU:[id number] DOB: Apr 02, 1955 DOA: 04/09/2023 PCP: Dione Housekeeper, MD    Brief Narrative:   68 y.o. male  stage IV prostate cancer, recurrent VTE, hypertension, PVD status post AKA, decubitus ulcers, who was admitted on 1/18 with shortness of breath and was found to have recurrent DVT despite Eliquis and then Coumadin (not technically a failure as he was not bridged.)  He had been unable to afford Lovenox as an outpatient.  During hospitalization he was noted to have volume overload and was diuresed.  Stay was also complicated by ileus for stool impaction that was treated aggressively with a bowel regimen.  He was discharged in stable condition on 1/31.  He returned to the ED on 2/4 due to increasing confusion and shortness of breath.  He was found to be saturating 89% on room air.  EMS placed patient on CPAP prior to arrival.  In the ED he was found to be septic with fever of 101.9, respiratory rate 31, leukocytosis 20K, and hypoxia.  Chest x-ray revealed left basilar opacity.  He was placed on BiPAP due to work of breathing and hypoxemia.  He failed weaning trial due to work of breathing though he did have good oxygen saturations.  He was admitted for management of healthcare associated pneumonia and sepsis. Hospital stay has been complex.  CT revealed multiple septic emboli in the lungs.  Port removal was attempted early on as this was suspected nidus of infection, however his INR was supratherapeutic which prevented port removal.  He did receive low-dose vitamin K on 2/7 and ultimately was able to have port removed on 2/9.  The tip culture was positive for MRSA infection.  He underwent TEE on 2/11.  He had further respiratory compromise throughout admission and was on prolonged BiPAP.  Further respiratory compromise appeared to be secondary to acute CHF exacerbation.  He was treated with IV diuresis and had more than 15 L net fluid loss since admission.   Respiratory failure has improved and he has been weaned off of oxygen.   2/12: Patient currently stable, does not want to go to SNF, asking to complete IV antibiotics and then go home, stating that he had everything in place at home to help.  TEE was negative, per ID will need 4 weeks of antibiotics.   2/13: Continue to improve.  Will complete IV vancomycin seen on 2/23 before leaving home with home health.  Patient will need 2 more weeks of linezolid afterwards.  ID is tapering home Wellbutrin so he can get linezolid.   Assessment & Plan:   Principal Problem:   MRSA bacteremia Active Problems:   HAP (hospital-acquired pneumonia)   Severe sepsis (HCC)   PE (pulmonary thromboembolism) (HCC)   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   Ogilvie syndrome   AKI (acute kidney injury) (HCC)   Prostate cancer metastatic to bone [facility])   Essential hypertension   Pulmonary infiltrates   Septic embolism (HCC)   Palliative care encounter   Acute respiratory failure with hypoxia (HCC)   Morbid obesity (HCC)  Severe sepsis, MRSA bacteremia, pneumonia, septic pulmonary emboli: Continue IV vancomycin through 04/28/2023.  This will be followed by 2 weeks of oral linezolid. Wellbutrin is being tapered off to allow linezolid use in the near future. Port-A-Cath was removed on 04/14/2023 and tip culture was positive for MRSA. TEE was negative for vegetation     Acute on chronic diastolic CHF: S/p  treatment with IV Lasix.  Continue oral Lasix daily     Pulmonary embolism, history of recurrent venous thromboembolism while on Eliquis: Continue warfarin and monitor INR daily     Acute urinary retention: Resolved.  Foley catheter was successfully removed on 04/19/2023.      Hypokalemia: Improved.  Replace as necessary Hypomagnesemia: Monitor and replace as necessary     Stage IV prostate cancer with metastasis to the bones: Patient endorsing left lower quadrant abdominal pain.  Felt to be  muscular in nature.  Potentially bone pain in the setting of metastatic prostate CA.  Multimodal pain regimen.  If pain persist we will order dedicated imaging.     Bilateral upper extremity weakness: Improving. This is likely from deconditioning. Continue PT and OT as able.       Acute hypoxic respiratory failure, acute kidney injury, acute metabolic encephalopathy: Resolved Completed prednisone taper on 04/22/2023.     Bilateral groin pain: Bilateral hip and pelvic x-ray on 04/20/2023 showed chronic soft tissue calcification of bilateral proximal thighs, degenerative changes of the lumbar spine and underlying osseous metastatic disease.     Diarrhea: Unclear etiology.  Could be related to antibiotic exposure.  Low suspicion for C. difficile given lack of fever, normal active bowel sounds, no significant leukocytosis.  GI PCR panel negative.  Continue BID questran, added prn lomotil  Morbid obesity BMI 62.94.  Complicating factor in overall care and prognosis.   DVT prophylaxis: Warfarin Code Status: DNR Family Communication:None Disposition Plan: Status is: Inpatient Remains inpatient appropriate because: Completion of antibiotic course   Level of care: Med-Surg  Consultants:  Infectious disease  Procedures:  None  Antimicrobials: Vancomycin   Subjective: Seen and examined.  Resting in bed comfortably.  No new complaints.  Reports improvement in left lower quadrant abdominal pain.    Objective: Vitals:   04/24/23 2106 04/25/23 0505 04/25/23 0825 04/25/23 1200  BP: (!) 97/51 97/65 128/70   Pulse:  80 78   Resp: 14 18 18    Temp: 100 F (37.8 C) 99 F (37.2 C) 100 F (37.8 C) 100 F (37.8 C)  TempSrc: Oral     SpO2: 97% (!) 89% 96%   Weight:      Height:        Intake/Output Summary (Last 24 hours) at 04/25/2023 1244 Last data filed at 04/25/2023 1200 Gross per 24 hour  Intake 0 ml  Output 1200 ml  Net -1200 ml   Filed Weights   04/09/23 2019 04/19/23 1555   Weight: 136 kg (!) 136.6 kg    Examination:  General exam: NAD Respiratory system: Lungs clear.  Normal work of breathing.  Room air Cardiovascular system: S1-S2, RRR, no murmurs, no pedal edema Gastrointestinal system: Obese, soft, tender to palpation left lower quadrant, normal active bowel sounds Central nervous system: Alert and oriented. No focal neurological deficits. Extremities: Symmetric 5 x 5 power. Skin: No rashes, lesions or ulcers Psychiatry: Judgement and insight appear normal. Mood & affect appropriate.     Data Reviewed: I have personally reviewed following labs and imaging studies  CBC: Recent Labs  Lab 04/19/23 0650 04/20/23 0451 04/21/23 0437 04/22/23 0621 04/25/23 0431  WBC 9.8 10.3 10.0 8.2 10.5  NEUTROABS 8.1* 8.6*  --   --   --   HGB 9.7* 9.5* 9.7* 10.1* 9.7*  HCT 29.7* 29.2* 29.2* 31.0* 29.9*  MCV 93.4 93.6 91.5 93.7 92.6  PLT 271 240 250 254 248   Basic Metabolic Panel: Recent Labs  Lab 04/19/23 0650 04/20/23 0451 04/23/23 0315 04/24/23 0448 04/25/23 0932  NA 139 141 138  --  133*  K 3.3* 3.5 3.5 3.8 3.8  CL 108 111 106  --  99  CO2 25 24 24   --  24  GLUCOSE 90 111* 81  --  112*  BUN 16 16 16   --  16  CREATININE 0.65 0.64 0.76 0.70 0.72  CALCIUM 9.1 9.2 8.7*  --  9.3  MG 1.7 1.7 1.6* 1.7  --   PHOS 2.8 2.8  --   --   --    GFR: Estimated Creatinine Clearance: 103.8 mL/min (by C-G formula based on SCr of 0.72 mg/dL). Liver Function Tests: Recent Labs  Lab 04/19/23 0650 04/20/23 0451  AST 29 40  ALT 36 44  ALKPHOS 58 61  BILITOT 0.5 0.6  PROT 6.0* 5.8*  ALBUMIN 2.6* 2.5*   No results for input(s): "LIPASE", "AMYLASE" in the last 168 hours. No results for input(s): "AMMONIA" in the last 168 hours. Coagulation Profile: Recent Labs  Lab 04/21/23 0437 04/22/23 0621 04/23/23 0315 04/24/23 0448 04/25/23 0431  INR 2.5* 2.4* 2.1* 2.1* 2.2*   Cardiac Enzymes: No results for input(s): "CKTOTAL", "CKMB", "CKMBINDEX",  "TROPONINI" in the last 168 hours. BNP (last 3 results) No results for input(s): "PROBNP" in the last 8760 hours. HbA1C: No results for input(s): "HGBA1C" in the last 72 hours. CBG: Recent Labs  Lab 04/19/23 0815 04/19/23 1240  GLUCAP 74 118*   Lipid Profile: No results for input(s): "CHOL", "HDL", "LDLCALC", "TRIG", "CHOLHDL", "LDLDIRECT" in the last 72 hours. Thyroid Function Tests: No results for input(s): "TSH", "T4TOTAL", "FREET4", "T3FREE", "THYROIDAB" in the last 72 hours. Anemia Panel: No results for input(s): "VITAMINB12", "FOLATE", "FERRITIN", "TIBC", "IRON", "RETICCTPCT" in the last 72 hours. Sepsis Labs: No results for input(s): "PROCALCITON", "LATICACIDVEN" in the last 168 hours.  Recent Results (from the past 240 hours)  Culture, blood (Routine X 2) w Reflex to ID Panel     Status: None   Collection Time: 04/15/23 10:05 PM   Specimen: BLOOD  Result Value Ref Range Status   Specimen Description BLOOD BLOOD RIGHT ARM  Final   Special Requests   Final    BOTTLES DRAWN AEROBIC AND ANAEROBIC Blood Culture adequate volume   Culture   Final    NO GROWTH 5 DAYS Performed at [facility], [address] [place], [place] [address]    Report Status 04/20/2023 FINAL  Final  Culture, blood (Routine X 2) w Reflex to ID Panel     Status: None   Collection Time: 04/15/23 10:05 PM   Specimen: BLOOD  Result Value Ref Range Status   Specimen Description BLOOD BLOOD LEFT ARM  Final   Special Requests   Final    BOTTLES DRAWN AEROBIC AND ANAEROBIC Blood Culture adequate volume   Culture   Final    NO GROWTH 5 DAYS Performed at [facility], [address] [place], [place] [address]    Report Status 04/20/2023 FINAL  Final  Resp panel by RT-PCR (RSV, Flu A&B, Covid) Anterior Nasal Swab     Status: None   Collection Time: 04/25/23  9:10 AM   Specimen: Anterior Nasal Swab  Result Value Ref Range Status   SARS Coronavirus 2 by RT PCR NEGATIVE NEGATIVE  Final    Comment: (NOTE) SARS-CoV-2 target nucleic acids are NOT DETECTED.  The SARS-CoV-2 RNA is generally detectable in upper respiratory specimens during the acute phase  of infection. The lowest concentration of SARS-CoV-2 viral copies this assay can detect is 138 copies/mL. A negative result does not preclude SARS-Cov-2 infection and should not be used as the sole basis for treatment or other patient management decisions. A negative result may occur with  improper specimen collection/handling, submission of specimen other than nasopharyngeal swab, presence of viral mutation(s) within the areas targeted by this assay, and inadequate number of viral copies(<138 copies/mL). A negative result must be combined with clinical observations, patient history, and epidemiological information. The expected result is Negative.  Fact Sheet for Patients:  BloggerCourse.com  Fact Sheet for Healthcare Providers:  SeriousBroker.it  This test is no t yet approved or cleared by the [place] FDA and  has been authorized for detection and/or diagnosis of SARS-CoV-2 by FDA under an Emergency Use Authorization (EUA). This EUA will remain  in effect (meaning this test can be used) for the duration of the COVID-19 declaration under Section 564(b)(1) of the Act, 21 U.S.C.section 360bbb-3(b)(1), unless the authorization is terminated  or revoked sooner.       Influenza A by PCR NEGATIVE NEGATIVE Final   Influenza B by PCR NEGATIVE NEGATIVE Final    Comment: (NOTE) The Xpert Xpress SARS-CoV-2/FLU/RSV plus assay is intended as an aid in the diagnosis of influenza from Nasopharyngeal swab specimens and should not be used as a sole basis for treatment. Nasal washings and aspirates are unacceptable for Xpert Xpress SARS-CoV-2/FLU/RSV testing.  Fact Sheet for Patients: BloggerCourse.com  Fact Sheet for Healthcare  Providers: SeriousBroker.it  This test is not yet approved or cleared by the [place] FDA and has been authorized for detection and/or diagnosis of SARS-CoV-2 by FDA under an Emergency Use Authorization (EUA). This EUA will remain in effect (meaning this test can be used) for the duration of the COVID-19 declaration under Section 564(b)(1) of the Act, 21 U.S.C. section 360bbb-3(b)(1), unless the authorization is terminated or revoked.     Resp Syncytial Virus by PCR NEGATIVE NEGATIVE Final    Comment: (NOTE) Fact Sheet for Patients: BloggerCourse.com  Fact Sheet for Healthcare Providers: SeriousBroker.it  This test is not yet approved or cleared by the [place] FDA and has been authorized for detection and/or diagnosis of SARS-CoV-2 by FDA under an Emergency Use Authorization (EUA). This EUA will remain in effect (meaning this test can be used) for the duration of the COVID-19 declaration under Section 564(b)(1) of the Act, 21 U.S.C. section 360bbb-3(b)(1), unless the authorization is terminated or revoked.  Performed at [facility], [address], [place], [place] [address]          Radiology Studies: No results found.      Scheduled Meds:  buPROPion  50 mg Oral Daily   cholestyramine light  4 g Oral BID   dicyclomine  10 mg Oral TID AC & HS   diphenhydrAMINE  12.5 mg Oral Q12H   Fe Fum-Vit C-Vit B12-FA  1 capsule Oral QPC breakfast   furosemide  20 mg Oral Daily   gabapentin  200 mg Oral TID   leptospermum manuka honey  1 Application Topical Daily   lidocaine  1 patch Transdermal Q24H   methocarbamol  500 mg Oral TID   mupirocin ointment   Nasal BID   nystatin cream   Topical BID   warfarin  6 mg Oral ONCE-1600   Warfarin - Pharmacist Dosing Inpatient   Does not apply q1600   Continuous Infusions:  vancomycin 1,000 mg (04/25/23 0547)  LOS: 15 days       Tresa Moore, MD [facility]   If 7PM-7AM, please contact night-coverage  04/25/2023, 12:44 PM

## 2023-04-25 NOTE — Plan of Care (Signed)
  Problem: Fluid Volume: Goal: Hemodynamic stability will improve Outcome: Progressing   Problem: Respiratory: Goal: Ability to maintain adequate ventilation will improve Outcome: Progressing   Problem: Education: Goal: Knowledge of General Education information will improve Description: Including pain rating scale, medication(s)/side effects and non-pharmacologic comfort measures Outcome: Progressing   Problem: Clinical Measurements: Goal: Ability to maintain clinical measurements within normal limits will improve Outcome: Progressing   Problem: Activity: Goal: Risk for activity intolerance will decrease Outcome: Progressing   Problem: Nutrition: Goal: Adequate nutrition will be maintained Outcome: Progressing   Problem: Coping: Goal: Level of anxiety will decrease Outcome: Progressing   Problem: Pain Managment: Goal: General experience of comfort will improve and/or be controlled Outcome: Progressing   Problem: Safety: Goal: Ability to remain free from injury will improve Outcome: Progressing   Problem: Skin Integrity: Goal: Risk for impaired skin integrity will decrease Outcome: Progressing   Problem: Activity: Goal: Ability to tolerate increased activity will improve Outcome: Progressing

## 2023-04-25 NOTE — Consult Note (Signed)
PHARMACY - ANTICOAGULATION CONSULT NOTE  Pharmacy Consult for Warfarin Indication: hx recurrent VTE  Patient Measurements: Height: 4\' 10"  (147.3 cm) Weight: (!) 136.2 kg (300 lb 4.8 oz) IBW/kg (Calculated) : 45.4  Labs: Recent Labs    04/09/23 2025 04/09/23 2132 04/10/23 0127 04/10/23 1051  HGB 10.9*  --   --  10.8*  HCT 31.4*  --   --  31.4*  PLT 299  --   --  229  APTT  --   --  55*  --   LABPROT  --   --  30.6*  --   INR  --   --  2.9*  --   CREATININE 3.89*  --   --  3.11*  TROPONINIHS  --  22*  --   --    Estimated Creatinine Clearance: 26.6 mL/min (A) (by C-G formula based on SCr of 3.11 mg/dL (H)).  Medical History: Past Medical History:  Diagnosis Date   Depression    History of blood clots 08/13/2011   History of left knee replacement    HTN (hypertension)    Lymphedema    lt leg-per pt   PE (pulmonary thromboembolism) (HCC)    Pressure injury of skin, unspecified injury stage, unspecified location    S/P AKA (above knee amputation) bilateral (HCC)    rt 11/24/07 and lt 01/11/17   PTA warfarin dosing: Warfarin dosing difficult to determine. Went to speak with patient who could not give a firm regimen. Base dose is 5 mg but phones in INR weekly with clinic. He estimates an average of warfarin 6 mg daily. Last dose was 2/4 morning.  Assessment: 68 y.o. 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 Patient was hospitalized last month for recurrent VTE. Patient was on warfarin at the time of that VTE, but at presentation INR was subtherapeutic at 1.2 due to lack of enoxaparin bridge, and so warfarin was seen as not a therapeutic failure. Pt has a large body habitus, and will require higher doses of warfarin. Unsure what his dose was outpatient if the clinic made any adjustments, so not sure what caused the  supratherapeutic INR. Last admission we had to give 10-15 mg doses to get INR therapeutic. No major DDI. Albumin is low at 2.8, which would allow more free warfarin.   Goal of Therapy:  INR 2-3 Monitor platelets by anticoagulation protocol: Yes  Monitoring: Date INR Warfarin Dose   2/5 2.9 6 mg  2/6 4.0 Hold  2/7 4.5 Hold, Vit K 1mg  IV for upcoming port removal   2/8 2.8 HOLD for procedure  2/9 2.1 HOLD for procedure  2/10 1.9 10 mg  2/11 1.9 10 mg  2/12 3.0 2.5 mg  2/13 3.4 5 mg  2/14 3.0 3 mg  2/15 3.1 2.5 mg  2/16 2.5 5 mg   2/17 2.4 5 mg  2/18 2.1 6 mg  2/19 2.1 6 mg  2/20 2.2            Plan:  INR within therapeutic range  will order warfarin 6 mg x 1 tonight again Monitor CBC per protocol and for signs/symptoms of bleeding Monitor INR daily  CBC per protocol  Kalianna Verbeke A, PharmD 04/25/2023 7:52 AM

## 2023-04-26 ENCOUNTER — Other Ambulatory Visit: Payer: Self-pay

## 2023-04-26 ENCOUNTER — Other Ambulatory Visit: Payer: Self-pay | Admitting: Internal Medicine

## 2023-04-26 ENCOUNTER — Encounter: Payer: Self-pay | Admitting: Internal Medicine

## 2023-04-26 ENCOUNTER — Inpatient Hospital Stay: Payer: Medicare PPO

## 2023-04-26 ENCOUNTER — Other Ambulatory Visit: Payer: Self-pay | Admitting: Infectious Diseases

## 2023-04-26 DIAGNOSIS — R7881 Bacteremia: Secondary | ICD-10-CM

## 2023-04-26 DIAGNOSIS — C61 Malignant neoplasm of prostate: Secondary | ICD-10-CM

## 2023-04-26 DIAGNOSIS — B9562 Methicillin resistant Staphylococcus aureus infection as the cause of diseases classified elsewhere: Secondary | ICD-10-CM | POA: Diagnosis not present

## 2023-04-26 DIAGNOSIS — R509 Fever, unspecified: Secondary | ICD-10-CM | POA: Diagnosis not present

## 2023-04-26 DIAGNOSIS — T80212A Local infection due to central venous catheter, initial encounter: Secondary | ICD-10-CM

## 2023-04-26 LAB — CBC WITH DIFFERENTIAL/PLATELET
Abs Immature Granulocytes: 0.01 10*3/uL (ref 0.00–0.07)
Basophils Absolute: 0.1 10*3/uL (ref 0.0–0.1)
Basophils Relative: 1 %
Eosinophils Absolute: 0.1 10*3/uL (ref 0.0–0.5)
Eosinophils Relative: 1 %
HCT: 27.2 % — ABNORMAL LOW (ref 39.0–52.0)
Hemoglobin: 8.8 g/dL — ABNORMAL LOW (ref 13.0–17.0)
Immature Granulocytes: 0 %
Lymphocytes Relative: 5 %
Lymphs Abs: 0.4 10*3/uL — ABNORMAL LOW (ref 0.7–4.0)
MCH: 29.9 pg (ref 26.0–34.0)
MCHC: 32.4 g/dL (ref 30.0–36.0)
MCV: 92.5 fL (ref 80.0–100.0)
Monocytes Absolute: 0.5 10*3/uL (ref 0.1–1.0)
Monocytes Relative: 7 %
Neutro Abs: 6 10*3/uL (ref 1.7–7.7)
Neutrophils Relative %: 86 %
Platelets: 222 10*3/uL (ref 150–400)
RBC: 2.94 MIL/uL — ABNORMAL LOW (ref 4.22–5.81)
RDW: 14.7 % (ref 11.5–15.5)
WBC: 7.1 10*3/uL (ref 4.0–10.5)
nRBC: 0 % (ref 0.0–0.2)

## 2023-04-26 LAB — BASIC METABOLIC PANEL
Anion gap: 9 (ref 5–15)
BUN: 17 mg/dL (ref 8–23)
CO2: 23 mmol/L (ref 22–32)
Calcium: 8.8 mg/dL — ABNORMAL LOW (ref 8.9–10.3)
Chloride: 101 mmol/L (ref 98–111)
Creatinine, Ser: 0.73 mg/dL (ref 0.61–1.24)
GFR, Estimated: >60 mL/min (ref >60)
Glucose, Bld: 116 mg/dL — ABNORMAL HIGH (ref 70–99)
Potassium: 3 mmol/L — ABNORMAL LOW (ref 3.5–5.1)
Sodium: 133 mmol/L — ABNORMAL LOW (ref 135–145)

## 2023-04-26 LAB — LACTIC ACID, PLASMA: Lactic Acid, Venous: 1.5 mmol/L (ref 0.5–1.9)

## 2023-04-26 LAB — PROTIME-INR
INR: 2.6 — ABNORMAL HIGH (ref 0.8–1.2)
Prothrombin Time: 28.2 s — ABNORMAL HIGH (ref 11.4–15.2)

## 2023-04-26 LAB — ABO/RH: ABO/RH(D): B POS

## 2023-04-26 MED ORDER — OXYCODONE HCL 5 MG PO TABS
5.0000 mg | ORAL_TABLET | Freq: Four times a day (QID) | ORAL | 0 refills | Status: DC | PRN
  Filled 2023-04-26: qty 20, 5d supply, fill #0

## 2023-04-26 MED ORDER — LISINOPRIL 10 MG PO TABS
10.0000 mg | ORAL_TABLET | Freq: Every day | ORAL | 0 refills | Status: DC
  Filled 2023-04-26: qty 30, 30d supply, fill #0

## 2023-04-26 MED ORDER — DICYCLOMINE HCL 10 MG PO CAPS
20.0000 mg | ORAL_CAPSULE | Freq: Three times a day (TID) | ORAL | Status: DC
  Administered 2023-04-26 (×2): 20 mg via ORAL
  Filled 2023-04-26 (×3): qty 2

## 2023-04-26 MED ORDER — SODIUM CHLORIDE 0.9% IV SOLUTION: Freq: Once | INTRAVENOUS | Status: DC

## 2023-04-26 MED ORDER — LINEZOLID 600 MG PO TABS
600.0000 mg | ORAL_TABLET | Freq: Two times a day (BID) | ORAL | 0 refills | Status: AC
  Filled 2023-04-26: qty 30, 15d supply, fill #0

## 2023-04-26 MED ORDER — GABAPENTIN 300 MG PO CAPS
600.0000 mg | ORAL_CAPSULE | Freq: Three times a day (TID) | ORAL | 0 refills | Status: AC
  Filled 2023-04-26: qty 180, 30d supply, fill #0

## 2023-04-26 MED ORDER — IOHEXOL 9 MG/ML PO SOLN
500.0000 mL | ORAL | Status: AC
  Administered 2023-04-26: 500 mL via ORAL

## 2023-04-26 MED ORDER — DICYCLOMINE HCL 10 MG PO CAPS
20.0000 mg | ORAL_CAPSULE | Freq: Three times a day (TID) | ORAL | 0 refills | Status: AC
  Filled 2023-04-26: qty 112, 14d supply, fill #0

## 2023-04-26 MED ORDER — DIPHENOXYLATE-ATROPINE 2.5-0.025 MG PO TABS
1.0000 | ORAL_TABLET | Freq: Four times a day (QID) | ORAL | 0 refills | Status: DC | PRN
  Filled 2023-04-26: qty 30, 8d supply, fill #0

## 2023-04-26 MED ORDER — FUROSEMIDE 20 MG PO TABS
20.0000 mg | ORAL_TABLET | Freq: Two times a day (BID) | ORAL | 0 refills | Status: DC
  Filled 2023-04-26: qty 60, 30d supply, fill #0

## 2023-04-26 MED ORDER — WARFARIN SODIUM 5 MG PO TABS
5.0000 mg | ORAL_TABLET | Freq: Once | ORAL | Status: AC
  Administered 2023-04-26: 5 mg via ORAL
  Filled 2023-04-26: qty 1

## 2023-04-26 MED ORDER — METHOCARBAMOL 500 MG PO TABS
500.0000 mg | ORAL_TABLET | Freq: Three times a day (TID) | ORAL | 0 refills | Status: AC
  Filled 2023-04-26: qty 42, 14d supply, fill #0

## 2023-04-26 MED ORDER — SIMETHICONE 80 MG PO CHEW
80.0000 mg | CHEWABLE_TABLET | Freq: Four times a day (QID) | ORAL | Status: DC
  Administered 2023-04-26 – 2023-04-27 (×4): 80 mg via ORAL
  Filled 2023-04-26 (×7): qty 1

## 2023-04-26 MED ORDER — DICLOFENAC SODIUM 1 % EX GEL
4.0000 g | Freq: Four times a day (QID) | CUTANEOUS | Status: DC
  Administered 2023-04-26 – 2023-04-27 (×2): 4 g via TOPICAL
  Filled 2023-04-26: qty 100

## 2023-04-26 NOTE — TOC Progression Note (Signed)
Transition of Care [facility]) - Progression Note    Patient Details  Name: Winton Offord MRN: [id number] Date of Birth: 10/19/1955  [facility]) CM/SW Contact  Garret Reddish, RN Phone Number: 04/26/2023, 3:59 PM  Clinical Narrative:    I have ordered a Bariatric drop down arm BSC via [employer].    I have asked Cletis Athens to deliver the drop down arm BSC to patient's home.    TOC will continue to follow for discharge planning.          Expected Discharge Plan and Services                                               Social Determinants of Health (SDOH) Interventions SDOH Screenings   Food Insecurity: No Food Insecurity (04/11/2023)  Housing: Low Risk  (04/11/2023)  Transportation Needs: No Transportation Needs (04/11/2023)  Utilities: Not At Risk (04/11/2023)  Financial Resource Strain: High Risk (07/28/2021)   Received from [facility], [facility]  Physical Activity: Sufficiently Active (07/28/2021)   Received from [facility], [facility]  Social Connections: Moderately Integrated (04/11/2023)  Stress: Stress Concern Present (08/01/2021)   Received from [facility], [facility]  Tobacco Use: Medium Risk (04/20/2023)    Readmission Risk Interventions    03/25/2023   10:53 AM  Readmission Risk Prevention Plan  Transportation Screening Complete  PCP or Specialist Appt within 5-7 Days Complete  Home Care Screening Complete  Medication Review (RN CM) Complete

## 2023-04-26 NOTE — TOC Progression Note (Signed)
Transition of Care [facility]) - Progression Note    Patient Details  Name: [name] MRN: [id number] Date of Birth: [birth year]  [facility] CM/SW Contact  Garret Reddish, RN Phone Number: 04/26/2023, 11:56 AM  Clinical Narrative:    Chart reviewed. Patient will have tentative discharge date of 04-28-23.  Patient will have completed IV Vancomycin on 04-28-23.  Patient having with some watery stools.   Patient had some questions about Hospital bed.  I have asked Cletis Athens with [employer] to speak with patient about Hospital bed.  Tentative delivery date for Hospital bed will be Monday February 24 th 2025.  Patient has been made aware.    TOC will continue to follow for discharge planning.          Expected Discharge Plan and Services                                               Social Determinants of Health (SDOH) Interventions SDOH Screenings   Food Insecurity: No Food Insecurity (04/11/2023)  Housing: Low Risk  (04/11/2023)  Transportation Needs: No Transportation Needs (04/11/2023)  Utilities: Not At Risk (04/11/2023)  Financial Resource Strain: High Risk (07/28/2021)   Received from [facility], [facility]  Physical Activity: Sufficiently Active (07/28/2021)   Received from [facility], [facility]  Social Connections: Moderately Integrated (04/11/2023)  Stress: Stress Concern Present (08/01/2021)   Received from [facility], [facility]  Tobacco Use: Medium Risk (04/20/2023)    Readmission Risk Interventions    03/25/2023   10:53 AM  Readmission Risk Prevention Plan  Transportation Screening Complete  PCP or Specialist Appt within 5-7 Days Complete  Home Care Screening Complete  Medication Review (RN CM) Complete

## 2023-04-26 NOTE — Progress Notes (Signed)
Met with patient- 12/02- 2024- eligard. Continue to HOLD [place] at this time.  Follow up in Cancer center in 3 -4 weeks- MD; labs- cbc/cmp;PSA; PT/INR

## 2023-04-26 NOTE — TOC Progression Note (Signed)
Transition of Care [facility]) - Progression Note    Patient Details  Name: [name] MRN: [id number] Date of Birth: July 03, 1955  Transition of Care [facility]) CM/SW Contact  Garret Reddish, RN Phone Number: 04/26/2023, 3:01 PM  Clinical Narrative:    Chart reviewed.  I have received a call from [place] with [facility].  [place] informs me that [facility] agency had accept [facility] referral on 04-06-23.  I have informed Shanda Bumps with [facility] that patient will be a discharge for Sunday 04-28-23.  I have informed Shanda Bumps that patient will need HH RN, PT, OT and in home aid.  I have also informed Shanda Bumps that patient will need a blood CMP and CBC on 2/28 and 3/7. Shanda Bumps reports that if patient is able to discharge home on Sunday [facility] can start service on Monday.    TOC will continue to follow for discharge planning.          Expected Discharge Plan and Services                                               Social Determinants of Health (SDOH) Interventions SDOH Screenings   Food Insecurity: No Food Insecurity (04/11/2023)  Housing: Low Risk  (04/11/2023)  Transportation Needs: No Transportation Needs (04/11/2023)  Utilities: Not At Risk (04/11/2023)  Financial Resource Strain: High Risk (07/28/2021)   Received from [facility], [facility]  Physical Activity: Sufficiently Active (07/28/2021)   Received from [facility], [facility]  Social Connections: Moderately Integrated (04/11/2023)  Stress: Stress Concern Present (08/01/2021)   Received from [facility], [facility]  Tobacco Use: Medium Risk (04/20/2023)    Readmission Risk Interventions    03/25/2023   10:53 AM  Readmission Risk Prevention Plan  Transportation Screening Complete  PCP or Specialist Appt within 5-7 Days Complete  Home Care Screening Complete  Medication Review (RN CM) Complete

## 2023-04-26 NOTE — Progress Notes (Signed)
Occupational Therapy Treatment Patient Details Name: [name] MRN: [id number] DOB: [birth year] Today's Date: 04/26/2023   History of present illness [name] is a 67yoM who comes to [facility] on 04/09/23 c confusion, SOB. Was here in Jan with recurrent DVT. Pt admitted with sepsis 2/4 due to PNA. 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 Pt previously on hospice at home, but no longer interested in this. Anticoaglulation being held as of 2/8 for pending port removal and TEE.   OT comments  Pt is supine in bed on arrival. Pleasant and agreeable to OT session. He does not appear to have pain. Pt performed all bed mobility MOD I as well as anterior/posterior transfer from bed<>BSC with swing arm BSC with supervision. Attempted lateral scoot but d/t pt size and length of BSC pt opted to try ant/post transfer as this is what he does to his regular toilet at home. Pt felt comfortable with this and is willing to get on [facility] with staff for toileting needs to prevent further breakdown from using bed pan. Re-edu on importance of t/fs to maintain strength/endurance and notified TOC via secure chat of need for bariatric drop arm BSC for DC home. All education has been provided and pt has met all goals and appears to be at baseline with ADL performance and will be discharged from acute OT services in house today. If further needs arise, OT can be consulted again.      If plan is discharge home, recommend the following:  Assistance with cooking/housework;Help with stairs or ramp for entrance;Assist for transportation   Equipment Recommendations  BSC/3in1 (bariatric or drop arm bariatric BSC)    Recommendations for Other Services      Precautions / Restrictions Restrictions Weight Bearing Restrictions Per Provider Order: No Other Position/Activity Restrictions: B AKA; skin breakdown on buttocks; continue to reinforce pressure  relief/positional changes       Mobility Bed Mobility Overal bed mobility: Modified Independent             General bed mobility comments: MOD I with all bed mobility    Transfers Overall transfer level: Needs assistance   Transfers: Bed to chair/wheelchair/BSC         Anterior-Posterior transfers: Supervision   General transfer comment: SUP to swing arm commode via ant/post t/f     Balance Overall balance assessment: Needs assistance Sitting-balance support: Single extremity supported Sitting balance-Leahy Scale: Normal         Standing balance comment: N/A                           ADL either performed or assessed with clinical judgement   ADL Overall ADL's : Needs assistance/impaired                         Toilet Transfer: Supervision/safety;BSC/3in1;Anterior/posterior Statistician Details (indicate cue type and reason): SUP for anterior/posterior t/f to swing arm BSC; attempted lateral scoot, but was not ideal, easier to do post/ant transfer as he does at home to his commode using grab bars from [facility]                Extremity/Trunk Assessment              Vision       Perception     Praxis     Communication Communication  Communication: No apparent difficulties   Cognition Arousal: Alert Behavior During Therapy: WFL for tasks assessed/performed                                          Cueing   Cueing Techniques: Verbal cues  Exercises Other Exercises Other Exercises: Discussed safety with transfers for return home, leaving BSC by the bed for days he is more tired or during the night otherwise to continue going to toilet from [facility] if feasible. Other Exercises: Notified TOC via secure chat of need for drop arm bariatric BSC. Other Exercises: Review of pressure relief strategies here and post DC.    Shoulder Instructions       General Comments      Pertinent Vitals/ Pain       Pain  Assessment Pain Assessment: Faces Faces Pain Scale: Hurts a little bit Pain Location: buttocks/L groin region with activity Pain Intervention(s): Monitored during session  Home Living                                          Prior Functioning/Environment              Frequency  Min 1X/week        Progress Toward Goals  OT Goals(current goals can now be found in the care plan section)  Progress towards OT goals: Goals met/education completed, patient discharged from OT  Acute Rehab OT Goals Patient Stated Goal: return home OT Goal Formulation: With patient Time For Goal Achievement: 04/29/23 Potential to Achieve Goals: Good  Plan      Co-evaluation                 AM-PAC OT "6 Clicks" Daily Activity     Outcome Measure   Help from another person eating meals?: None Help from another person taking care of personal grooming?: None Help from another person toileting, which includes using toliet, bedpan, or urinal?: A Little Help from another person bathing (including washing, rinsing, drying)?: None Help from another person to put on and taking off regular upper body clothing?: None Help from another person to put on and taking off regular lower body clothing?: None 6 Click Score: 23    End of Session    OT Visit Diagnosis: Other abnormalities of gait and mobility (R26.89);Muscle weakness (generalized) (M62.81)   Activity Tolerance Patient tolerated treatment well   Patient Left in bed;with call bell/phone within reach   Nurse Communication Mobility status        Time: 9562-1308 OT Time Calculation (min): 20 min  Charges: OT General Charges $OT Visit: 1 Visit OT Treatments $Self Care/Home Management : 8-22 mins  Orena Cavazos, OTR/L  04/26/23, 3:10 PM   Lolly Glaus E Kyshaun Barnette 04/26/2023, 3:07 PM

## 2023-04-26 NOTE — Plan of Care (Signed)
   Problem: Fluid Volume: Goal: Hemodynamic stability will improve Outcome: Progressing   Problem: Clinical Measurements: Goal: Diagnostic test results will improve Outcome: Progressing Goal: Signs and symptoms of infection will decrease Outcome: Progressing   Problem: Respiratory: Goal: Ability to maintain adequate ventilation will improve Outcome: Progressing

## 2023-04-26 NOTE — Progress Notes (Signed)
PROGRESS NOTE    [name]  BJY:[id number] DOB: 1955/11/26 DOA: 04/09/2023 PCP: Dione Housekeeper, MD    Brief Narrative:   68 y.o. male  stage IV prostate cancer, recurrent VTE, hypertension, PVD status post AKA, decubitus ulcers, who was admitted on 1/18 with shortness of breath and was found to have recurrent DVT despite Eliquis and then Coumadin (not technically a failure as he was not bridged.)  He had been unable to afford Lovenox as an outpatient.  During hospitalization he was noted to have volume overload and was diuresed.  Stay was also complicated by ileus for stool impaction that was treated aggressively with a bowel regimen.  He was discharged in stable condition on 1/31.  He returned to the ED on 2/4 due to increasing confusion and shortness of breath.  He was found to be saturating 89% on room air.  EMS placed patient on CPAP prior to arrival.  In the ED he was found to be septic with fever of 101.9, respiratory rate 31, leukocytosis 20K, and hypoxia.  Chest x-ray revealed left basilar opacity.  He was placed on BiPAP due to work of breathing and hypoxemia.  He failed weaning trial due to work of breathing though he did have good oxygen saturations.  He was admitted for management of healthcare associated pneumonia and sepsis. Hospital stay has been complex.  CT revealed multiple septic emboli in the lungs.  Port removal was attempted early on as this was suspected nidus of infection, however his INR was supratherapeutic which prevented port removal.  He did receive low-dose vitamin K on 2/7 and ultimately was able to have port removed on 2/9.  The tip culture was positive for MRSA infection.  He underwent TEE on 2/11.  He had further respiratory compromise throughout admission and was on prolonged BiPAP.  Further respiratory compromise appeared to be secondary to acute CHF exacerbation.  He was treated with IV diuresis and had more than 15 L net fluid loss since admission.   Respiratory failure has improved and he has been weaned off of oxygen.   2/12: Patient currently stable, does not want to go to SNF, asking to complete IV antibiotics and then go home, stating that he had everything in place at home to help.  TEE was negative, per ID will need 4 weeks of antibiotics.   2/13: Continue to improve.  Will complete IV vancomycin seen on 2/23 before leaving home with home health.  Patient will need 2 more weeks of linezolid afterwards.  ID is tapering home Wellbutrin so he can get linezolid.   Assessment & Plan:   Principal Problem:   MRSA bacteremia Active Problems:   HAP (hospital-acquired pneumonia)   Severe sepsis (HCC)   PE (pulmonary thromboembolism) (HCC)   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   Ogilvie syndrome   AKI (acute kidney injury) (HCC)   Prostate cancer metastatic to bone [facility])   Essential hypertension   Pulmonary infiltrates   Septic embolism (HCC)   Palliative care encounter   Acute respiratory failure with hypoxia (HCC)   Morbid obesity (HCC)   Rash  Severe sepsis, MRSA bacteremia, pneumonia, septic pulmonary emboli: Continue IV vancomycin through 04/28/2023.  This will be followed by 2 weeks of oral linezolid. Wellbutrin is being tapered off to allow linezolid use in the near future. Port-A-Cath was removed on 04/14/2023 and tip culture was positive for MRSA. TEE was negative for vegetation  Breakthrough fevers. Low grade, unclear etiology.  Blood cultures repeated, NGTD Covid/flu/RSV negative No resp symptoms Continue to monitor vitals and fever curve APAP prn fever     Acute on chronic diastolic CHF: S/p treatment with IV Lasix.  Continue oral Lasix daily     Pulmonary embolism, history of recurrent venous thromboembolism while on Eliquis: Continue warfarin and monitor INR daily     Acute urinary retention: Resolved.  Foley catheter was successfully removed on 04/19/2023.      Hypokalemia: Improved.   Replace as necessary Hypomagnesemia: Monitor and replace as necessary     Stage IV prostate cancer with metastasis to the bones: Patient endorsing left lower quadrant abdominal pain.  Felt to be muscular in nature.  Potentially bone pain in the setting of metastatic prostate CA.  Multimodal pain regimen.  Pain control improved     Bilateral upper extremity weakness: Improving. This is likely from deconditioning. Continue PT and OT as able.       Acute hypoxic respiratory failure, acute kidney injury, acute metabolic encephalopathy: Resolved Completed prednisone taper on 04/22/2023.     Bilateral groin pain: Bilateral hip and pelvic x-ray on 04/20/2023 showed chronic soft tissue calcification of bilateral proximal thighs, degenerative changes of the lumbar spine and underlying osseous metastatic disease.     Diarrhea: Unclear etiology.  Could be related to antibiotic exposure.  Low suspicion for C. difficile given lack of fever, normal active bowel sounds, no significant leukocytosis.  GI PCR panel negative.  Continue BID questran, added prn lomotil.  Added simethicone and bentyl.  Check fecal elastase and fecal calprotectin  Morbid obesity BMI 62.94.  Complicating factor in overall care and prognosis.   DVT prophylaxis: Warfarin Code Status: DNR Family Communication:None Disposition Plan: Status is: Inpatient Remains inpatient appropriate because: Completion of antibiotic course   Level of care: Med-Surg  Consultants:  Infectious disease  Procedures:  None  Antimicrobials: Vancomycin   Subjective: Seen and examined.  Appears relatively comfortable today.  Getting frustrated about continued hospitalization.  Objective: Vitals:   04/25/23 1200 04/25/23 2142 04/26/23 0518 04/26/23 0839  BP:  105/67 105/61 (!) 102/55  Pulse:  72 77 77  Resp:  18 18 16   Temp: 100 F (37.8 C) 99.7 F (37.6 C) 99.9 F (37.7 C) (!) 100.4 F (38 C)  TempSrc:    Oral  SpO2:  95% 95% 96%   Weight:      Height:        Intake/Output Summary (Last 24 hours) at 04/26/2023 1035 Last data filed at 04/26/2023 0910 Gross per 24 hour  Intake 439.78 ml  Output 400 ml  Net 39.78 ml   Filed Weights   04/09/23 2019 04/19/23 1555  Weight: 136 kg (!) 136.6 kg    Examination:  General exam: No acute distress Respiratory system: Lungs clear.  Normal work of breathing.  Room air Cardiovascular system: S1-S2, RRR, no murmurs, no pedal edema Gastrointestinal system: Obese, soft, nondistended, TTP left lower quadrant, normal bowel sounds Central nervous system: Alert and oriented. No focal neurological deficits. Extremities: Symmetric 5 x 5 power. Skin: No rashes, lesions or ulcers Psychiatry: Judgement and insight appear normal. Mood & affect appropriate.     Data Reviewed: I have personally reviewed following labs and imaging studies  CBC: Recent Labs  Lab 04/20/23 0451 04/21/23 0437 04/22/23 0621 04/25/23 0431  WBC 10.3 10.0 8.2 10.5  NEUTROABS 8.6*  --   --   --   HGB 9.5* 9.7* 10.1* 9.7*  HCT 29.2* 29.2* 31.0* 29.9*  MCV 93.6 91.5 93.7 92.6  PLT 240 250 254 248   Basic Metabolic Panel: Recent Labs  Lab 04/20/23 0451 04/23/23 0315 04/24/23 0448 04/25/23 0932  NA 141 138  --  133*  K 3.5 3.5 3.8 3.8  CL 111 106  --  99  CO2 24 24  --  24  GLUCOSE 111* 81  --  112*  BUN 16 16  --  16  CREATININE 0.64 0.76 0.70 0.72  CALCIUM 9.2 8.7*  --  9.3  MG 1.7 1.6* 1.7  --   PHOS 2.8  --   --   --    GFR: Estimated Creatinine Clearance: 103.8 mL/min (by C-G formula based on SCr of 0.72 mg/dL). Liver Function Tests: Recent Labs  Lab 04/20/23 0451  AST 40  ALT 44  ALKPHOS 61  BILITOT 0.6  PROT 5.8*  ALBUMIN 2.5*   No results for input(s): "LIPASE", "AMYLASE" in the last 168 hours. No results for input(s): "AMMONIA" in the last 168 hours. Coagulation Profile: Recent Labs  Lab 04/22/23 0621 04/23/23 0315 04/24/23 0448 04/25/23 0431 04/26/23 0446  INR  2.4* 2.1* 2.1* 2.2* 2.6*   Cardiac Enzymes: No results for input(s): "CKTOTAL", "CKMB", "CKMBINDEX", "TROPONINI" in the last 168 hours. BNP (last 3 results) No results for input(s): "PROBNP" in the last 8760 hours. HbA1C: No results for input(s): "HGBA1C" in the last 72 hours. CBG: Recent Labs  Lab 04/19/23 1240  GLUCAP 118*   Lipid Profile: No results for input(s): "CHOL", "HDL", "LDLCALC", "TRIG", "CHOLHDL", "LDLDIRECT" in the last 72 hours. Thyroid Function Tests: No results for input(s): "TSH", "T4TOTAL", "FREET4", "T3FREE", "THYROIDAB" in the last 72 hours. Anemia Panel: No results for input(s): "VITAMINB12", "FOLATE", "FERRITIN", "TIBC", "IRON", "RETICCTPCT" in the last 72 hours. Sepsis Labs: No results for input(s): "PROCALCITON", "LATICACIDVEN" in the last 168 hours.  Recent Results (from the past 240 hours)  Resp panel by RT-PCR (RSV, Flu A&B, Covid) Anterior Nasal Swab     Status: None   Collection Time: 04/25/23  9:10 AM   Specimen: Anterior Nasal Swab  Result Value Ref Range Status   SARS Coronavirus 2 by RT PCR NEGATIVE NEGATIVE Final    Comment: (NOTE) SARS-CoV-2 target nucleic acids are NOT DETECTED.  The SARS-CoV-2 RNA is generally detectable in upper respiratory specimens during the acute phase of infection. The lowest concentration of SARS-CoV-2 viral copies this assay can detect is 138 copies/mL. A negative result does not preclude SARS-Cov-2 infection and should not be used as the sole basis for treatment or other patient management decisions. A negative result may occur with  improper specimen collection/handling, submission of specimen other than nasopharyngeal swab, presence of viral mutation(s) within the areas targeted by this assay, and inadequate number of viral copies(<138 copies/mL). A negative result must be combined with clinical observations, patient history, and epidemiological information. The expected result is Negative.  Fact Sheet for  Patients:  BloggerCourse.com  Fact Sheet for Healthcare Providers:  SeriousBroker.it  This test is no t yet approved or cleared by the [place] FDA and  has been authorized for detection and/or diagnosis of SARS-CoV-2 by FDA under an Emergency Use Authorization (EUA). This EUA will remain  in effect (meaning this test can be used) for the duration of the COVID-19 declaration under Section 564(b)(1) of the Act, 21 U.S.C.section 360bbb-3(b)(1), unless the authorization is terminated  or revoked sooner.       Influenza A by PCR NEGATIVE NEGATIVE Final   Influenza B  by PCR NEGATIVE NEGATIVE Final    Comment: (NOTE) The Xpert Xpress SARS-CoV-2/FLU/RSV plus assay is intended as an aid in the diagnosis of influenza from Nasopharyngeal swab specimens and should not be used as a sole basis for treatment. Nasal washings and aspirates are unacceptable for Xpert Xpress SARS-CoV-2/FLU/RSV testing.  Fact Sheet for Patients: BloggerCourse.com  Fact Sheet for Healthcare Providers: SeriousBroker.it  This test is not yet approved or cleared by the [place] FDA and has been authorized for detection and/or diagnosis of SARS-CoV-2 by FDA under an Emergency Use Authorization (EUA). This EUA will remain in effect (meaning this test can be used) for the duration of the COVID-19 declaration under Section 564(b)(1) of the Act, 21 U.S.C. section 360bbb-3(b)(1), unless the authorization is terminated or revoked.     Resp Syncytial Virus by PCR NEGATIVE NEGATIVE Final    Comment: (NOTE) Fact Sheet for Patients: BloggerCourse.com  Fact Sheet for Healthcare Providers: SeriousBroker.it  This test is not yet approved or cleared by the [place] FDA and has been authorized for detection and/or diagnosis of SARS-CoV-2 by FDA under an Emergency  Use Authorization (EUA). This EUA will remain in effect (meaning this test can be used) for the duration of the COVID-19 declaration under Section 564(b)(1) of the Act, 21 U.S.C. section 360bbb-3(b)(1), unless the authorization is terminated or revoked.  Performed at [facility], [address] [place], [place] [address]   Culture, blood (Routine X 2) w Reflex to ID Panel     Status: None (Preliminary result)   Collection Time: 04/25/23  9:32 AM   Specimen: BLOOD  Result Value Ref Range Status   Specimen Description BLOOD RFOA  Final   Special Requests   Final    BOTTLES DRAWN AEROBIC AND ANAEROBIC Blood Culture adequate volume   Culture   Final    NO GROWTH < 24 HOURS Performed at [facility], [address] [place], [place] [address]    Report Status PENDING  Incomplete  Culture, blood (Routine X 2) w Reflex to ID Panel     Status: None (Preliminary result)   Collection Time: 04/25/23  9:32 AM   Specimen: BLOOD  Result Value Ref Range Status   Specimen Description BLOOD LFOA  Final   Special Requests   Final    BOTTLES DRAWN AEROBIC AND ANAEROBIC Blood Culture adequate volume   Culture   Final    NO GROWTH < 24 HOURS Performed at [facility], [address] [place], [place] [address]    Report Status PENDING  Incomplete         Radiology Studies: No results found.      Scheduled Meds:  buPROPion  50 mg Oral Daily   cholestyramine light  4 g Oral BID   dicyclomine  20 mg Oral TID AC & HS   Fe Fum-Vit C-Vit B12-FA  1 capsule Oral QPC breakfast   furosemide  20 mg Oral Daily   gabapentin  200 mg Oral TID   leptospermum manuka honey  1 Application Topical Daily   lidocaine  1 patch Transdermal Q24H   linezolid  600 mg Oral Q12H   methocarbamol  500 mg Oral TID   mupirocin ointment   Nasal BID   nystatin cream   Topical BID   simethicone  80 mg Oral QID   warfarin  5 mg Oral ONCE-1600   Warfarin - Pharmacist Dosing  Inpatient   Does not apply q1600   Continuous Infusions:  LOS: 16 days      Tresa Moore, MD [facility]   If 7PM-7AM, please contact night-coverage  04/26/2023, 10:35 AM

## 2023-04-26 NOTE — Progress Notes (Signed)
Approximately 1809--This RN received call from Radiology. Per Radiology, critical result of abdominal Xray concerning for possible sigmoid volvulus. MD Sreenath notified. Pt made NPO, per MD, and STAT CT Abd ordered.

## 2023-04-26 NOTE — Progress Notes (Signed)
 Patient is not able to walk the distance required to go the bathroom, or he/she is unable to safely negotiate stairs required to access the bathroom.  A 3in1 BSC will alleviate this problem

## 2023-04-26 NOTE — Consult Note (Signed)
PHARMACY - ANTICOAGULATION CONSULT NOTE  Pharmacy Consult for Warfarin Indication: hx recurrent VTE  Patient Measurements: Height: 4\' 10"  (147.3 cm) Weight: (!) 136.2 kg (300 lb 4.8 oz) IBW/kg (Calculated) : 45.4  Labs: Recent Labs    04/09/23 2025 04/09/23 2132 04/10/23 0127 04/10/23 1051  HGB 10.9*  --   --  10.8*  HCT 31.4*  --   --  31.4*  PLT 299  --   --  229  APTT  --   --  55*  --   LABPROT  --   --  30.6*  --   INR  --   --  2.9*  --   CREATININE 3.89*  --   --  3.11*  TROPONINIHS  --  22*  --   --    Estimated Creatinine Clearance: 26.6 mL/min (A) (by C-G formula based on SCr of 3.11 mg/dL (H)).  Medical History: Past Medical History:  Diagnosis Date   Depression    History of blood clots 08/13/2011   History of left knee replacement    HTN (hypertension)    Lymphedema    lt leg-per pt   PE (pulmonary thromboembolism) (HCC)    Pressure injury of skin, unspecified injury stage, unspecified location    S/P AKA (above knee amputation) bilateral (HCC)    rt 11/24/07 and lt 01/11/17   PTA warfarin dosing: Warfarin dosing difficult to determine. Went to speak with patient who could not give a firm regimen. Base dose is 5 mg but phones in INR weekly with clinic. He estimates an average of warfarin 6 mg daily. Last dose was 2/4 morning.  Assessment: 68 y.o. 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 Patient was hospitalized last month for recurrent VTE. Patient was on warfarin at the time of that VTE, but at presentation INR was subtherapeutic at 1.2 due to lack of enoxaparin bridge, and so warfarin was seen as not a therapeutic failure. Pt has a large body habitus, and will require higher doses of warfarin. Unsure what his dose was outpatient if the clinic made any adjustments, so not sure what caused the  supratherapeutic INR. Last admission we had to give 10-15 mg doses to get INR therapeutic. No major DDI. Albumin is low at 2.8, which would allow more free warfarin.   Goal of Therapy:  INR 2-3 Monitor platelets by anticoagulation protocol: Yes  Monitoring: Date INR Warfarin Dose   2/5 2.9 6 mg  2/6 4.0 Hold  2/7 4.5 Hold, Vit K 1mg  IV for upcoming port removal   2/8 2.8 HOLD for procedure  2/9 2.1 HOLD for procedure  2/10 1.9 10 mg  2/11 1.9 10 mg  2/12 3.0 2.5 mg  2/13 3.4 5 mg  2/14 3.0 3 mg  2/15 3.1 2.5 mg  2/16 2.5 5 mg   2/17 2.4 5 mg  2/18 2.1 6 mg  2/19 2.1 6 mg  2/20 2.2 6 mg  2/21 2.6        Plan:  INR within therapeutic range  will order warfarin 5 mg x 1 today Monitor CBC per protocol and for signs/symptoms of bleeding Monitor INR daily  CBC per protocol  Masami Plata A, PharmD 04/26/2023 7:26 AM

## 2023-04-26 NOTE — Progress Notes (Signed)
Date of Admission:  04/09/2023   T ID: [name] is a 68 y.o. male  Principal Problem:   MRSA bacteremia Active Problems:   Prostate cancer metastatic to bone [facility])   PE (pulmonary thromboembolism) (HCC)   Essential hypertension   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   Ogilvie syndrome   HAP (hospital-acquired pneumonia)   Severe sepsis (HCC)   AKI (acute kidney injury) (HCC)   Pulmonary infiltrates   Septic embolism (HCC)   Palliative care encounter   Acute respiratory failure with hypoxia (HCC)   Morbid obesity (HCC)   Rash    Subjective: Pt is not itching today Wants  to go home    Medications:   diclofenac Sodium  4 g Topical QID   dicyclomine  20 mg Oral TID AC & HS   Fe Fum-Vit C-Vit B12-FA  1 capsule Oral QPC breakfast   furosemide  20 mg Oral Daily   gabapentin  200 mg Oral TID   leptospermum manuka honey  1 Application Topical Daily   lidocaine  1 patch Transdermal Q24H   linezolid  600 mg Oral Q12H   methocarbamol  500 mg Oral TID   mupirocin ointment   Nasal BID   nystatin cream   Topical BID   simethicone  80 mg Oral QID   warfarin  5 mg Oral ONCE-1600   Warfarin - Pharmacist Dosing Inpatient   Does not apply q1600    Objective: Patient Vitals for the past 24 hrs:  BP Temp Temp src Pulse Resp SpO2  04/26/23 1648 118/65 98.7 F (37.1 C) -- 80 -- 94 %  04/26/23 1108 (!) 107/59 99.4 F (37.4 C) Oral -- -- --  04/26/23 0839 (!) 102/55 (!) 100.4 F (38 C) Oral 77 16 96 %  04/26/23 0518 105/61 99.9 F (37.7 C) -- 77 18 95 %  04/25/23 2142 105/67 99.7 F (37.6 C) -- 72 18 95 %        PHYSICAL EXAM:  General:awake and alert, no resp distress Lungs: Bilateral air entry Heart: S1-S2. Abdomen: Soft, non-tender,not distended. Bowel sounds normal. No masses Extremities: Bilateral AKA  skin: erythema around neck improved today With some papular eruprion    04/25/23       Lymph: Cervical, supraclavicular  normal. Neurologic: Grossly non-focal Abd soft- distended B/l AKA  Lab Results    Latest Ref Rng & Units 04/25/2023    4:31 AM 04/22/2023    6:21 AM 04/21/2023    4:37 AM  CBC  WBC 4.0 - 10.5 K/uL 10.5  8.2  10.0   Hemoglobin 13.0 - 17.0 g/dL 9.7  16.1  9.7   Hematocrit 39.0 - 52.0 % 29.9  31.0  29.2   Platelets 150 - 400 K/uL 248  254  250        Latest Ref Rng & Units 04/25/2023    9:32 AM 04/24/2023    4:48 AM 04/23/2023    3:15 AM  CMP  Glucose 70 - 99 mg/dL 096   81   BUN 8 - 23 mg/dL 16   16   Creatinine 0.45 - 1.24 mg/dL 4.09  8.11  9.14   Sodium 135 - 145 mmol/L 133   138   Potassium 3.5 - 5.1 mmol/L 3.8  3.8  3.5   Chloride 98 - 111 mmol/L 99   106   CO2 22 - 32 mmol/L 24   24   Calcium 8.9 - 10.3 mg/dL  9.3   8.7       Microbiology: [facility] MRSA on 04/09/23 both sets 2/7 BC- NG 2/9 cath tip  MRSA 2/10 BC No growth 2/20 BC sent  Assessment/Plan:  MRSA bacteremia With port in place and multiple nodules in the lungs 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   Wellbutrin is being  tapered and stopped today to avoid  potential risk of serotonin syndrome with linezolid- Though It is a lower risk for serotonin syndrome compared to  SSRI /SNRI he was on a high dose of 300mg   a long acting form.     Low grade fever Erythema and  itching over neck and papular rash Red man syndrome from vanco was  suspected Gave low dose benadryl but not effective As no other explanation for low grade fever,( may be ca prostate? 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 On call ID available by phone if needed   If he is still here on Monday and if you need ID to see please call the ID covering me as I will be away for 2 weeks

## 2023-04-26 NOTE — Progress Notes (Signed)
 Labs entered.

## 2023-04-27 ENCOUNTER — Other Ambulatory Visit: Payer: Self-pay

## 2023-04-27 DIAGNOSIS — R7881 Bacteremia: Secondary | ICD-10-CM | POA: Diagnosis not present

## 2023-04-27 DIAGNOSIS — B9562 Methicillin resistant Staphylococcus aureus infection as the cause of diseases classified elsewhere: Secondary | ICD-10-CM | POA: Diagnosis not present

## 2023-04-27 DIAGNOSIS — K5981 Ogilvie syndrome: Secondary | ICD-10-CM | POA: Diagnosis not present

## 2023-04-27 LAB — PROTIME-INR
INR: 2.6 — ABNORMAL HIGH (ref 0.8–1.2)
Prothrombin Time: 27.7 s — ABNORMAL HIGH (ref 11.4–15.2)

## 2023-04-27 MED ORDER — SIMETHICONE 80 MG PO CHEW
80.0000 mg | CHEWABLE_TABLET | Freq: Four times a day (QID) | ORAL | 0 refills | Status: DC
  Filled 2023-04-27: qty 30, 8d supply, fill #0

## 2023-04-27 MED ORDER — POTASSIUM CHLORIDE CRYS ER 20 MEQ PO TBCR
40.0000 meq | EXTENDED_RELEASE_TABLET | ORAL | Status: AC
Start: 2023-04-27 — End: 2023-04-27
  Administered 2023-04-27 (×2): 40 meq via ORAL
  Filled 2023-04-27 (×2): qty 2

## 2023-04-27 MED ORDER — LACTULOSE 10 GM/15ML PO SOLN
20.0000 g | Freq: Two times a day (BID) | ORAL | 0 refills | Status: DC | PRN
  Filled 2023-04-27: qty 237, 4d supply, fill #0

## 2023-04-27 MED ORDER — WARFARIN SODIUM 5 MG PO TABS
5.0000 mg | ORAL_TABLET | Freq: Once | ORAL | Status: DC
  Filled 2023-04-27: qty 1

## 2023-04-27 MED ORDER — SMOG ENEMA
960.0000 mL | Freq: Once | RECTAL | Status: DC
  Filled 2023-04-27: qty 960

## 2023-04-27 NOTE — Progress Notes (Signed)
 RN received a message from lab stating the patient's Blood is ready to be picked up. RN received in report that this blood was ordered in case the patient requires surgery tomorrow and was instructed by dayshift attending to not administer this blood.   RN discussed above information with Alecia Lemming, NP. RN instructed to not administer the blood this shift.

## 2023-04-27 NOTE — Progress Notes (Addendum)
 Patient had 2 BM overnight. BM is clear like water in appearance, absent of color. Noted to be jelly like in consistency. Physician is aware per the 04/25/20 dayshift RN.

## 2023-04-27 NOTE — TOC Transition Note (Signed)
 Transition of Care [facility]) - Discharge Note   Patient Details  Name: [name] MRN: [id number] Date of Birth: 1955/04/20  Transition of Care [facility]) CM/SW Contact:  Bing Quarry, RN Phone Number: 04/27/2023, 10:15 AM   Clinical Narrative: 04/27/23: Patient is being discharged today to home with [facility] via [facility] who was notified of discharge today via Kerrie [place]. DME hospital bed and drop arm BSC to be delivered to home per [employer] per 04/26/23 CM note, patient made aware by Unit RN. Transportation to home via [facility], forms printed to unit, provider to place Portable DNR form in hard chart for EMS.     Wever Cirri MSN RN CM  RN Case Manager [place]  Transitions of Care Direct Dial: [phone] (Weekends Only) [facility] Main Office Phone: [phone] [facility] Fax: [phone] [email or web address]       Final next level of care: Home w Home Health Services Barriers to Discharge: Barriers Resolved   Patient Goals and CMS Choice            Discharge Placement                       Discharge Plan and Services Additional resources added to the After Visit Summary for                  DME Arranged: 3-N-1 (Drop Arm BSC to be delivered to home via [employer] per CM not on 04/26/23.) DME Agency: [facility] Date DME Agency Contacted: 04/26/23   Representative spoke with at DME Agency: [facility] HH Arranged: PT, OT, RN, Nurse's Aide HH Agency: [facility] Date [facility] Agency Contacted: 04/26/23   Representative spoke with at [facility] Agency: Shanda Bumps per CM note of 04/26/23 (blood CMP and CBC on 2/28 and 3/7)  Social Drivers of Health (SDOH) Interventions SDOH Screenings   Food Insecurity: No Food Insecurity (04/11/2023)  Housing: Low Risk  (04/11/2023)  Transportation Needs: No Transportation Needs (04/11/2023)  Utilities: Not At Risk (04/11/2023)  Financial Resource Strain: High Risk (07/28/2021)   Received from [facility], [facility]  Physical  Activity: Sufficiently Active (07/28/2021)   Received from [facility], [facility]  Social Connections: Moderately Integrated (04/11/2023)  Stress: Stress Concern Present (08/01/2021)   Received from [facility], [facility]  Tobacco Use: Medium Risk (04/20/2023)     Readmission Risk Interventions    03/25/2023   10:53 AM  Readmission Risk Prevention Plan  Transportation Screening Complete  PCP or Specialist Appt within 5-7 Days Complete  Home Care Screening Complete  Medication Review (RN CM) Complete

## 2023-04-27 NOTE — Plan of Care (Signed)
  Problem: Fluid Volume: Goal: Hemodynamic stability will improve Outcome: Progressing   Problem: Clinical Measurements: Goal: Diagnostic test results will improve Outcome: Progressing Goal: Signs and symptoms of infection will decrease Outcome: Progressing   Problem: Clinical Measurements: Goal: Respiratory complications will improve Outcome: Progressing Goal: Cardiovascular complication will be avoided Outcome: Progressing   Problem: Coping: Goal: Level of anxiety will decrease Outcome: Progressing   Problem: Elimination: Goal: Will not experience complications related to bowel motility Outcome: Progressing Goal: Will not experience complications related to urinary retention Outcome: Progressing   Problem: Pain Managment: Goal: General experience of comfort will improve and/or be controlled Outcome: Progressing   Problem: Safety: Goal: Ability to remain free from injury will improve Outcome: Progressing   Problem: Skin Integrity: Goal: Risk for impaired skin integrity will decrease Outcome: Progressing

## 2023-04-27 NOTE — Discharge Summary (Signed)
 Physician Discharge Summary  [name] ZOX:[id number] DOB: 08-06-1955 DOA: 04/09/2023  PCP: Dione Housekeeper, MD  Admit date: 04/09/2023 Discharge date: 04/27/2023  Admitted From: Home Disposition:  Home w home health  Recommendations for Outpatient Follow-up:  Follow up with PCP in 1-2 weeks   Home Health:Yes PT OT RN aide Equipment/Devices:Hospital bed, bedside commode   Discharge Condition:Stable  CODE STATUS:DNR  Diet recommendation: Soft/bland  Brief/Interim Summary:    68 y.o. male  stage IV prostate cancer, recurrent VTE, hypertension, PVD status post AKA, decubitus ulcers, who was admitted on 1/18 with shortness of breath and was found to have recurrent DVT despite Eliquis and then Coumadin (not technically a failure as he was not bridged.)  He had been unable to afford Lovenox as an outpatient.  During hospitalization he was noted to have volume overload and was diuresed.  Stay was also complicated by ileus for stool impaction that was treated aggressively with a bowel regimen.  He was discharged in stable condition on 1/31.  He returned to the ED on 2/4 due to increasing confusion and shortness of breath.  He was found to be saturating 89% on room air.  EMS placed patient on CPAP prior to arrival.  In the ED he was found to be septic with fever of 101.9, respiratory rate 31, leukocytosis 20K, and hypoxia.  Chest x-ray revealed left basilar opacity.  He was placed on BiPAP due to work of breathing and hypoxemia.  He failed weaning trial due to work of breathing though he did have good oxygen saturations.  He was admitted for management of healthcare associated pneumonia and sepsis. Hospital stay has been complex.  CT revealed multiple septic emboli in the lungs.  Port removal was attempted early on as this was suspected nidus of infection, however his INR was supratherapeutic which prevented port removal.  He did receive low-dose vitamin K on 2/7 and ultimately was able to  have port removed on 2/9.  The tip culture was positive for MRSA infection.  He underwent TEE on 2/11.  He had further respiratory compromise throughout admission and was on prolonged BiPAP.  Further respiratory compromise appeared to be secondary to acute CHF exacerbation.  He was treated with IV diuresis and had more than 15 L net fluid loss since admission.  Respiratory failure has improved and he has been weaned off of oxygen.   2/12: Patient currently stable, does not want to go to SNF, asking to complete IV antibiotics and then go home, stating that he had everything in place at home to help.  TEE was negative, per ID will need 4 weeks of antibiotics.   2/13: Continue to improve.  Will complete IV vancomycin seen on 2/23 before leaving home with home health.  Patient will need 2 more weeks of linezolid afterwards.  ID is tapering home Wellbutrin so he can get linezolid.   2/22: On 2/21 KUB noted possible sigmoid volvulus.  This was followed by a CAT scan which showed significant colonic distention with no evidence of obstruction or volvulus.  Patient was recommended for disimpaction attempt with follow-up enema however refused both.  This despite my clear warnings about the possibility of chronic constipation and obstruction.  This time he refuses and wishes to go home.  On discharge I do recommend a regular bowel regimen which I will prescribe and sent to [facility] pharmacy.  Patient will need follow-up labs and done in the cancer center on 2/28.  Patient's oncologist Dr. Donneta Romberg  is aware.  Infectious disease was following during hospitalization.  Patient is on oral antibiotics and completion of course will be prescribed on DC.   Discharge Diagnoses:  Principal Problem:   MRSA bacteremia Active Problems:   HAP (hospital-acquired pneumonia)   Severe sepsis (HCC)   PE (pulmonary thromboembolism) (HCC)   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   Ogilvie syndrome   AKI (acute kidney  injury) (HCC)   Prostate cancer metastatic to bone [facility])   Essential hypertension   Pulmonary infiltrates   Septic embolism (HCC)   Palliative care encounter   Acute respiratory failure with hypoxia (HCC)   Morbid obesity (HCC)   Rash   Port or reservoir infection Severe sepsis, MRSA bacteremia, pneumonia, septic pulmonary emboli: Patient on oral Zyvox at time of discharge.  2-week course prescribed.  Wellbutrin held while on antibiotic.  Consider restarting at later date   Breakthrough fevers. Low grade, unclear etiology.  Blood cultures repeated, NGTD Covid/flu/RSV negative No resp symptoms      Acute on chronic diastolic CHF: S/p treatment with IV Lasix.  Continue oral Lasix daily     Pulmonary embolism, history of recurrent venous thromboembolism while on Eliquis: Continue warfarin and monitor INR daily     Acute urinary retention: Resolved.  Foley catheter was successfully removed on 04/19/2023.      Hypokalemia: Improved.  Replace as necessary Hypomagnesemia: Monitor and replace as necessary     Stage IV prostate cancer with metastasis to the bones: Patient endorsing left lower quadrant abdominal pain.  Felt to be muscular in nature.  Potentially bone pain in the setting of metastatic prostate CA.  Multimodal pain regimen.  Pain control improved     Bilateral upper extremity weakness: Improving. This is likely from deconditioning. Continue PT and OT as able.       Acute hypoxic respiratory failure, acute kidney injury, acute metabolic encephalopathy: Resolved Completed prednisone taper on 04/22/2023.     Bilateral groin pain: Bilateral hip and pelvic x-ray on 04/20/2023 showed chronic soft tissue calcification of bilateral proximal thighs, degenerative changes of the lumbar spine and underlying osseous metastatic disease.     Diarrhea: Suspect osmotic diuresis in the setting of colonic distention and increase patient was offered digital disimpaction and enema on day of  discharge.  He declined both.  He was educated on importance.  Continue to decline.  Will remain at high risk for hospital readmission.   Morbid obesity BMI 62.94.  Complicating factor in overall care and prognosis.   Discharge Instructions  Discharge Instructions     Diet - low sodium heart healthy   Complete by: As directed    For home use only DME Hospital bed   Complete by: As directed    Bariatric bed   Length of Need: Lifetime   Bed type: Semi-electric   Increase activity slowly   Complete by: As directed    No wound care   Complete by: As directed       Allergies as of 04/27/2023       Reactions   Bacitracin Hives   Vancomycin Other (See Comments), Rash   Red man syndrome.   Ketorolac    Other Reaction(s): Kidney Disorder Other reaction(s): Kidney Disorder   Spironolactone    Other Reaction(s): Unknown Other reaction(s): Unknown        Medication List     PAUSE taking these medications    darolutamide 300 MG tablet Wait to take this until your  doctor or other care provider tells you to start again. Commonly known as: NUBEQA Take 2 tablets (600 mg total) by mouth 2 (two) times daily with a meal.       TAKE these medications    buPROPion 300 MG 24 hr tablet Commonly known as: WELLBUTRIN XL Take 300 mg by mouth daily.   dicyclomine 10 MG capsule Commonly known as: BENTYL Take 2 capsules (20 mg total) by mouth 4 (four) times daily -  before meals and at bedtime for 14 days.   diphenoxylate-atropine 2.5-0.025 MG tablet Commonly known as: LOMOTIL Take 1 tablet by mouth 4 (four) times daily as needed for diarrhea or loose stools.   furosemide 20 MG tablet Commonly known as: Lasix Take 1 tablet (20 mg total) by mouth 2 (two) times daily.   gabapentin 300 MG capsule Commonly known as: Neurontin Take 2 capsules (600 mg total) by mouth 3 (three) times daily.   lactulose 10 GM/15ML solution Commonly known as: CHRONULAC Take 30 mLs (20 g total) by  mouth 2 (two) times daily as needed for mild constipation.   leptospermum manuka honey Pste paste Apply 1 Application topically daily.   lidocaine-prilocaine cream Commonly known as: EMLA Apply 1 Application topically as needed.   linezolid 600 MG tablet Commonly known as: ZYVOX Take 1 tablet (600 mg total) by mouth 2 (two) times daily for 15 days.   lisinopril 10 MG tablet Commonly known as: ZESTRIL Take 1 tablet (10 mg total) by mouth daily.   methocarbamol 500 MG tablet Commonly known as: ROBAXIN Take 1 tablet (500 mg total) by mouth 3 (three) times daily for 14 days.   ondansetron 8 MG tablet Commonly known as: ZOFRAN Take 1 tablet (8 mg total) by mouth every 8 (eight) hours as needed for nausea or vomiting.   oxyCODONE 5 MG immediate release tablet Commonly known as: Oxy IR/ROXICODONE Take 1 tablet (5 mg total) by mouth every 6 (six) hours as needed for severe pain (pain score 7-10).   oxyCODONE-acetaminophen 5-325 MG tablet Commonly known as: PERCOCET/ROXICET Take 1 tablet by mouth every 12 (twelve) hours as needed for moderate pain (pain score 4-6).   polyethylene glycol powder 17 GM/SCOOP powder Commonly known as: GLYCOLAX/MIRALAX Take 17 g by mouth 2 (two) times daily. Mix as directed.   psyllium 95 % Pack Commonly known as: HYDROCIL/METAMUCIL Take 1 packet by mouth daily.   senna-docusate 8.6-50 MG tablet Commonly known as: Senokot-S Take 2 tablets by mouth 2 (two) times daily as needed for mild constipation.   simethicone 80 MG chewable tablet Commonly known as: MYLICON Chew 1 tablet (80 mg total) by mouth 4 (four) times daily.   warfarin 5 MG tablet Commonly known as: COUMADIN Take 5-7.5 mg by mouth daily.   warfarin 5 MG tablet Commonly known as: COUMADIN Take 5 mg by mouth daily at 4 PM.               Durable Medical Equipment  (From admission, onward)           Start     Ordered   04/26/23 1545  For home use only DME Bedside  commode  Once       Comments: Bariatric drop arm bedside commode  Question:  Patient needs a bedside commode to treat with the following condition  Answer:  Weakness   04/26/23 1544   04/25/23 1450  For home use only DME Hospital bed  Once       Question  Answer Comment  Length of Need Lifetime   The above medical condition requires: Patient requires the ability to reposition frequently   Bed type Heavy-duty, semi-electric (for patients >350 lbs.)   Support Surface: Low Air loss Mattress      04/25/23 1449   04/19/23 0000  For home use only DME Hospital bed       Comments: Bariatric bed  Question Answer Comment  Length of Need Lifetime   Bed type Semi-electric      04/19/23 1634            Follow-up Information     Dione Housekeeper, MD Follow up.   Specialty: Family Medicine Why: Hospital follow up Contact information: [address] [place] [address] [phone]                Allergies  Allergen Reactions   Bacitracin Hives   Vancomycin Other (See Comments) and Rash    Red man syndrome.   Ketorolac     Other Reaction(s): Kidney Disorder  Other reaction(s): Kidney Disorder   Spironolactone     Other Reaction(s): Unknown  Other reaction(s): Unknown    Consultations: General Surgery Infectious disease   Procedures/Studies: CT ABDOMEN PELVIS WO CONTRAST Result Date: 04/26/2023 CLINICAL DATA:  Abdominal distension.  Bowel obstruction suspected. EXAM: CT ABDOMEN AND PELVIS WITHOUT CONTRAST TECHNIQUE: Multidetector CT imaging of the abdomen and pelvis was performed following the standard protocol without IV contrast. RADIATION DOSE REDUCTION: This exam was performed according to the departmental dose-optimization program which includes automated exposure control, adjustment of the mA and/or kV according to patient size and/or use of iterative reconstruction technique. COMPARISON:  04/09/2023 FINDINGS: Lower chest: Small to moderate right pleural  effusion, increasing since prior study. Hepatobiliary: Numerous gallstones layering within the gallbladder, unchanged. No biliary ductal dilatation. No focal hepatic abnormality. Pancreas: No focal abnormality or ductal dilatation. Spleen: No focal abnormality.  Normal size. Adrenals/Urinary Tract: Adrenal glands are normal. Left renal cysts are stable. No follow-up imaging recommended. No stones or hydronephrosis. Urinary bladder unremarkable. Stomach/Bowel: Tortuous colon. Moderate stool burden. Moderate gaseous distention of the sigmoid colon. No evidence of bowel obstruction. Vascular/Lymphatic: No evidence of aneurysm or adenopathy. Reproductive: No visible focal abnormality. Other: No free fluid or free air. Musculoskeletal: Extensive sclerotic lesions throughout the bony structures compatible with metastases, stable. IMPRESSION: Moderate gaseous distension and tortuosity of the sigmoid colon. Moderate stool burden throughout the colon. No evidence of bowel obstruction. Cholelithiasis. Small to moderate right pleural effusion, increasing since prior study. Compressive atelectasis in the right lower lobe. Extensive osseous metastatic disease, stable. Electronically Signed   By: Charlett Nose M.D.   On: 04/26/2023 23:13   DG Abd 1 View Result Date: 04/26/2023 CLINICAL DATA:  Distension EXAM: ABDOMEN - 1 VIEW COMPARISON:  CT 04/09/2023, radiograph 04/03/2023 FINDINGS: Air distension of the colon. Marked air distension of distal colon, measuring up to 14.4 cm with coffee bean configuration, raising concern for possible sigmoid volvulus. No small bowel distension. Moderate stool in the colon IMPRESSION: Marked air distension of distal colon with configuration raising concern for possible sigmoid volvulus. CT is recommended for further assessment. These results will be called to the ordering clinician or representative by the Radiologist Assistant, and communication documented in the PACS or [employer].  Electronically Signed   By: Jasmine Pang M.D.   On: 04/26/2023 17:55   DG HIPS BILAT WITH PELVIS MIN 5 VIEWS Result Date: 04/20/2023 CLINICAL DATA:  [id number] Bilateral groin pain  [id number] EXAM: DG HIP (WITH OR WITHOUT PELVIS) 5+V BILAT COMPARISON:  March 09, 2023, October 12, 2022 FINDINGS: Mottled appearance of the osseous structures consistent with underlying osseous metastatic disease. No acute fracture or dislocation. Sacrum is obscured by overlapping bowel contents. Degenerative changes of the lower lumbar spine. Chronic soft tissue calcification of bilateral proximal thighs. IMPRESSION: Mottled appearance of the osseous structures consistent with underlying osseous metastatic disease. No acute fracture or dislocation. If there is a persistent clinical concern for nondisplaced hip or pelvic fracture, recommend dedicated pelvic CT or MRI. Electronically Signed   By: Meda Klinefelter M.D.   On: 04/20/2023 14:10   ECHO TEE Result Date: 04/17/2023    TRANSESOPHOGEAL ECHO REPORT   Patient Name:   [name] Date of Exam: 04/16/2023 Medical Rec #:  [id number]            Height:       58.0 in Accession #:    [id number]           Weight:       299.8 lb Date of Birth:  1955-08-07            BSA:          2.163 m Patient Age:    67 years             BP:           132/77 mmHg Patient Gender: M                    HR:           72 bpm. Exam Location:  [facility] Procedure: Transesophageal Echo and Color Doppler Indications:     Bacteremia  History:         Patient has prior history of Echocardiogram examinations, most                  recent 04/10/2023. Signs/Symptoms:Bacteremia and Shortness of                  Breath; Risk Factors:Hypertension.  Sonographer:     Mikki Harbor Sonographer#2:   Lucendia Herrlich Referring Phys:  [id number] CARALYN HUDSON Diagnosing Phys: Rozell Searing Custovic PROCEDURE: Unable to pass transesophogeal probe. Sedation performed by different physician. The patient's vital signs; including heart  rate, blood pressure, and oxygen saturation; remained stable throughout the procedure. The patient developed no complications during the procedure.  IMPRESSIONS  1. Left ventricular ejection fraction, by estimation, is 60 to 65%. The left ventricle has normal function. The left ventricle has no regional wall motion abnormalities.  2. Right ventricular systolic function is normal. The right ventricular size is normal.  3. Left atrial size was mildly dilated. No left atrial/left atrial appendage thrombus was detected.  4. The mitral valve is normal in structure. No evidence of mitral valve regurgitation. No evidence of mitral stenosis.  5. The aortic valve is normal in structure. Aortic valve regurgitation is not visualized. No aortic stenosis is present.  6. The inferior vena cava is normal in size with greater than 50% respiratory variability, suggesting right atrial pressure of 3 mmHg. Conclusion(s)/Recommendation(s): No evidence of vegetation/infective endocarditis on this transesophageael echocardiogram. FINDINGS  Left Ventricle: Left ventricular ejection fraction, by estimation, is 60 to 65%. The left ventricle has normal function. The left ventricle has no regional wall motion abnormalities. The left ventricular internal cavity size was normal in size. There is  no left ventricular hypertrophy. Right Ventricle: The right ventricular  size is normal. No increase in right ventricular wall thickness. Right ventricular systolic function is normal. Left Atrium: Left atrial size was mildly dilated. No left atrial/left atrial appendage thrombus was detected. Right Atrium: Right atrial size was normal in size. Pericardium: There is no evidence of pericardial effusion. Mitral Valve: The mitral valve is normal in structure. No evidence of mitral valve regurgitation. No evidence of mitral valve stenosis. Tricuspid Valve: The tricuspid valve is normal in structure. Tricuspid valve regurgitation is not demonstrated. Aortic  Valve: The aortic valve is normal in structure. Aortic valve regurgitation is not visualized. No aortic stenosis is present. Aorta: The aortic root is normal in size and structure. Venous: The inferior vena cava is normal in size with greater than 50% respiratory variability, suggesting right atrial pressure of 3 mmHg. IAS/Shunts: No atrial level shunt detected by color flow Doppler. Rozell Searing Custovic Electronically signed by Clotilde Dieter Signature Date/Time: 04/17/2023/9:04:51 AM    Final    IR REMOVAL TUN ACCESS W/ PORT W/O FL MOD SED Result Date: 04/14/2023 INDICATION: 68 year old male with a chronic indwelling port catheter which was placed by interventional radiology in September of 2024. Patient is currently admitted with MRSA bacteremia and pneumonia. He requires port catheter explantation. EXAM: REMOVAL RIGHT IJ VEIN PORT-A-CATH MEDICATIONS: None ANESTHESIA/SEDATION: None FLUOROSCOPY TIME:  None COMPLICATIONS: None immediate. PROCEDURE: Informed written consent was obtained from the patient after a thorough discussion of the procedural risks, benefits and alternatives. All questions were addressed. Maximal Sterile Barrier Technique was utilized including caps, mask, sterile gowns, sterile gloves, sterile drape, hand hygiene and skin antiseptic. A timeout was performed prior to the initiation of the procedure. The right chest was prepped and draped in a sterile fashion. Lidocaine was utilized for local anesthesia. An incision was made over the previously healed surgical incision. Utilizing blunt dissection, the port catheter and reservoir were removed from the underlying subcutaneous tissue in their entirety. Securing sutures were also removed. The pocket was irrigated with a copious amount of sterile normal saline. The pocket was closed with interrupted 3-0 Vicryl stitches. The subcutaneous tissue was closed with 3-0 Vicryl interrupted subcutaneous stitches. A 4-0 Vicryl running subcuticular stitch was  utilized to approximate the skin. Dermabond was applied. IMPRESSION: Successful right IJ vein Port-A-Cath explantation. Electronically Signed   By: Malachy Moan M.D.   On: 04/14/2023 12:06   DG Chest Port 1 View Result Date: 04/13/2023 CLINICAL DATA:  Dyspnea EXAM: PORTABLE CHEST - 1 VIEW COMPARISON:  04/12/2023 FINDINGS: Unchanged cardiomegaly and mild pulmonary vascular congestion. Interval worsening of bilateral basilar airspace opacities. Small bilateral pleural effusions, appears slightly larger on the current exam. Postsurgical changes of the cervical spine are partially visualized. Right IJ central venous catheter unchanged in position terminating near the cavoatrial junction. IMPRESSION: 1. Unchanged cardiomegaly and pulmonary vascular congestion. 2. Interval worsening of minimal bilateral pleural effusions. Interval worsening of bibasilar airspace opacities likely combination of pulmonary edema and atelectasis. Electronically Signed   By: Acquanetta Belling M.D.   On: 04/13/2023 10:51   DG Chest Port 1 View Result Date: 04/12/2023 CLINICAL DATA:  Dyspnea and respiratory abnormality EXAM: PORTABLE CHEST 1 VIEW COMPARISON:  04/10/2023 FINDINGS: Low lung volumes accentuate cardiomediastinal silhouette and pulmonary vascularity. Bibasilar atelectasis or infiltrates. Question small left pleural effusion. No pneumothorax. Cervical spine fusion hardware. IMPRESSION: Low lung volumes with bibasilar atelectasis or infiltrates. Question small left pleural effusion. Electronically Signed   By: Minerva Fester M.D.   On: 04/12/2023 13:12   ECHOCARDIOGRAM COMPLETE Result  Date: 04/10/2023    ECHOCARDIOGRAM REPORT   Patient Name:   [name] Date of Exam: 04/10/2023 Medical Rec #:  [id number]            Height:       58.0 in Accession #:    [id number]           Weight:       299.8 lb Date of Birth:  1955/11/09            BSA:          2.163 m Patient Age:    80 years             BP:           132/64 mmHg  Patient Gender: M                    HR:           83 bpm. Exam Location:  [facility] Procedure: 2D Echo, Cardiac Doppler and Color Doppler Indications:     Endocarditis I38  History:         Patient has prior history of Echocardiogram examinations, most                  recent 04/23/2023. Risk Factors:Hypertension.  Sonographer:     Cristela Blue Referring Phys:  [id number] Debarah Crape Diagnosing Phys: Julien Nordmann MD  Sonographer Comments: Technically challenging study due to limited acoustic windows, no apical window and no subcostal window. IMPRESSIONS  1. Left ventricular ejection fraction, by estimation, is 55 to 60%. The left ventricle has normal function. The left ventricle has no regional wall motion abnormalities. The left ventricular internal cavity size was mildly dilated. Left ventricular diastolic parameters are indeterminate.  2. Right ventricular systolic function is normal. The right ventricular size is normal.  3. Left atrial size was mildly dilated.  4. The mitral valve is normal in structure. No evidence of mitral valve regurgitation. No evidence of mitral stenosis.  5. The aortic valve is normal in structure. Aortic valve regurgitation is not visualized. No aortic stenosis is present.  6. There is mild dilatation of the aortic root, measuring 44 mm. There is moderate dilatation of the ascending aorta, measuring 46 mm.  7. The inferior vena cava is normal in size with greater than 50% respiratory variability, suggesting right atrial pressure of 3 mmHg. FINDINGS  Left Ventricle: Left ventricular ejection fraction, by estimation, is 55 to 60%. The left ventricle has normal function. The left ventricle has no regional wall motion abnormalities. The left ventricular internal cavity size was mildly dilated. There is  no left ventricular hypertrophy. Left ventricular diastolic parameters are indeterminate. Right Ventricle: The right ventricular size is normal. No increase in right ventricular wall thickness.  Right ventricular systolic function is normal. Left Atrium: Left atrial size was mildly dilated. Right Atrium: Right atrial size was normal in size. Pericardium: There is no evidence of pericardial effusion. Mitral Valve: The mitral valve is normal in structure. There is mild calcification of the mitral valve leaflet(s). No evidence of mitral valve regurgitation. No evidence of mitral valve stenosis. Tricuspid Valve: The tricuspid valve is normal in structure. Tricuspid valve regurgitation is not demonstrated. No evidence of tricuspid stenosis. Aortic Valve: The aortic valve is normal in structure. Aortic valve regurgitation is not visualized. No aortic stenosis is present. Pulmonic Valve: The pulmonic valve was normal in structure. Pulmonic valve regurgitation is not visualized. No evidence of pulmonic  stenosis. Aorta: The aortic root is normal in size and structure. There is mild dilatation of the aortic root, measuring 44 mm. There is moderate dilatation of the ascending aorta, measuring 46 mm. Venous: The inferior vena cava is normal in size with greater than 50% respiratory variability, suggesting right atrial pressure of 3 mmHg. IAS/Shunts: No atrial level shunt detected by color flow Doppler.  LEFT VENTRICLE PLAX 2D LVIDd:         5.70 cm LVIDs:         3.40 cm LV PW:         0.90 cm LV IVS:        1.10 cm LVOT diam:     2.20 cm LVOT Area:     3.80 cm  LEFT ATRIUM         Index LA diam:    4.30 cm 1.99 cm/m   AORTA Ao Root diam: 4.70 cm  SHUNTS Systemic Diam: 2.20 cm Julien Nordmann MD Electronically signed by Julien Nordmann MD Signature Date/Time: 04/10/2023/4:34:27 PM    Final    DG Chest 1 View Result Date: 04/10/2023 CLINICAL DATA:  Dyspnea. EXAM: CHEST  1 VIEW COMPARISON:  04/09/2023 FINDINGS: Single-view of the chest demonstrates very low lung volumes. Again noted is a right jugular Port-A-Cath and the tip is near the superior cavoatrial junction but poorly characterized. Cardiac silhouette is  prominent due to low lung volumes. Again noted are postsurgical changes in the lower cervical spine and cervicothoracic junction. Sclerotic lesions are noted in the left clavicle and compatible with history of prostate cancer. IMPRESSION: 1. Low lung volumes without acute findings. 2. Sclerotic lesions in the left clavicle compatible with history of prostate cancer. Electronically Signed   By: Richarda Overlie M.D.   On: 04/10/2023 14:47   CT CHEST ABDOMEN PELVIS WO CONTRAST Result Date: 04/10/2023 CLINICAL DATA:  Sepsis, metastatic prostate cancer, altered mental status, dyspnea. * Tracking Code: BO * EXAM: CT CHEST, ABDOMEN AND PELVIS WITHOUT CONTRAST TECHNIQUE: Multidetector CT imaging of the chest, abdomen and pelvis was performed following the standard protocol without IV contrast. RADIATION DOSE REDUCTION: This exam was performed according to the departmental dose-optimization program which includes automated exposure control, adjustment of the mA and/or kV according to patient size and/or use of iterative reconstruction technique. COMPARISON:  03/26/2023 FINDINGS: CT CHEST FINDINGS Cardiovascular: No significant coronary artery calcification. Global cardiac size is mildly enlarged. No pericardial effusion. Central pulmonary arteries are mildly enlarged suggesting changes of pulmonary arterial hypertension. Mild atherosclerotic calcification within the thoracic aorta. No aortic aneurysm. Right internal jugular chest port tip seen at the superior cavoatrial junction. Mediastinum/Nodes: No enlarged mediastinal, hilar, or axillary lymph nodes. Thyroid gland, trachea, and esophagus demonstrate no significant findings. Lungs/Pleura: Multiple peripheral rounded masses are seen within the lungs measuring up to 18 x 21 mm with the index lesion within the left upper lobe demonstrating central cavitation and a ground-glass halo suspicious for multiple septic emboli particularly as these appear new within the lung bases  since prior examination. A small bilateral pleural effusions are present with bibasilar compressive atelectasis. No pneumothorax. No central obstructing lesion. Musculoskeletal: Numerous sclerotic metastases are seen throughout the visualized axial skeleton. No pathologic fracture identified. CT ABDOMEN PELVIS FINDINGS Hepatobiliary: Cholelithiasis without pericholecystic inflammatory change noted. Liver unremarkable in this noncontrast examination. No intra or extrahepatic biliary ductal dilation. Pancreas: Unremarkable Spleen: Unremarkable Adrenals/Urinary Tract: The adrenal glands are unremarkable. The kidneys are normal in size and position. Multiple root hyperdense renal cysts  are seen within the left kidney which were not metabolically active on PET CT examination of 10/12/2022 and further follow-up is not required. The kidneys are otherwise unremarkable. Bladder is unremarkable. Stomach/Bowel: The distal colon and rectum are fluid-filled in keeping with a diarrheal state. The stomach, small bowel, large bowel otherwise unremarkable. Appendix normal. No free intraperitoneal gas or fluid. Vascular/Lymphatic: Aortic atherosclerosis. No enlarged abdominal or pelvic lymph nodes. Reproductive: Prostate is unremarkable. Other: No abdominal wall hernia or abnormality. No abdominopelvic ascites. 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 No pathologic fracture. IMPRESSION: 1. Multiple peripheral rounded masses within the lungs measuring up to 18 x 21 mm with the index lesion within the left upper lobe demonstrating central cavitation and a ground-glass Halo suspicious for multiple septic emboli, a a particularly as these appear new within the lung bases since prior examination. 2. Small bilateral pleural effusions with bibasilar compressive atelectasis. 3. Extensive sclerotic metastases throughout the  axial skeleton in keeping with the patient's known history of metastatic prostate cancer. No pathologic fracture identified. 4. Cholelithiasis. 5. Fluid-filled distal colon and rectum in keeping with a diarrheal state. Aortic Atherosclerosis (ICD10-I70.0). Electronically Signed   By: Helyn Numbers M.D.   On: 04/10/2023 00:08   DG Chest Portable 1 View Result Date: 04/09/2023 CLINICAL DATA:  Shortness of breath EXAM: PORTABLE CHEST 1 VIEW COMPARISON:  03/29/2023 FINDINGS: There is a right chest wall power-injectable Port-A-Cath with tip at the cavoatrial junction via a right internal jugular vein approach. Mild cardiomegaly. Mild left basilar opacity, likely atelectasis. IMPRESSION: Mild left basilar opacity, likely atelectasis. Electronically Signed   By: Deatra Robinson M.D.   On: 04/09/2023 20:58   DG Abd 1 View Result Date: 04/03/2023 CLINICAL DATA:  Ileus. EXAM: ABDOMEN - 1 VIEW COMPARISON:  March 30, 2023. FINDINGS: Stable probable large bowel dilatation. No definite small bowel dilatation is noted. No abnormal calcifications. IMPRESSION: Stable probable large bowel dilatation is noted suggesting ileus. Electronically Signed   By: Lupita Raider M.D.   On: 04/03/2023 12:54   DG Abd 1 View Result Date: 03/30/2023 CLINICAL DATA:  Abdominal distension EXAM: ABDOMEN - 1 VIEW COMPARISON:  03/28/2023 FINDINGS: Scattered large and small bowel gas is noted. The degree of sigmoid gaseous dilatation is relatively stable. No obstructive changes are seen. No free air is noted. Mild retained fecal material is noted in the right colon. This is stable from the prior exam. IMPRESSION: Stable appearance when compare with the prior exam. Electronically Signed   By: Alcide Clever M.D.   On: 03/30/2023 20:04   DG Chest Port 1 View Result Date: 03/29/2023 CLINICAL DATA:  Dyspnea EXAM: PORTABLE CHEST - 1 VIEW COMPARISON:  03/23/2023 FINDINGS: Improved aeration. No focal infiltrate. Stable right IJ port catheter to the  distal SVC. Heart size and mediastinal contours are within normal limits. Aortic Atherosclerosis (ICD10-170.0). No effusion. Cervical fixation hardware partially visualized. IMPRESSION: No acute cardiopulmonary disease. Electronically Signed   By: Corlis Leak M.D.   On: 03/29/2023 14:54      Subjective: Seen and examined on the day of discharge. Hemodynamically stable.  Still some abdominal distention.  Refusing enema and digital disimpaction.  Discharge Exam: Vitals:   04/26/23 2018 04/27/23 0846  BP: 114/61 111/64  Pulse: 87 77  Resp: 18 20  Temp: 98.8 F (37.1 C) 98.4 F (36.9 C)  SpO2: 97% 95%   Vitals:   04/26/23 1108  04/26/23 1648 04/26/23 2018 04/27/23 0846  BP: (!) 107/59 118/65 114/61 111/64  Pulse:  80 87 77  Resp:   18 20  Temp: 99.4 F (37.4 C) 98.7 F (37.1 C) 98.8 F (37.1 C) 98.4 F (36.9 C)  TempSrc: Oral  Oral   SpO2:  94% 97% 95%  Weight:      Height:        General: Pt is alert, awake, not in acute distress Cardiovascular: RRR, S1/S2 +, no rubs, no gallops Respiratory: CTA bilaterally, no wheezing, no rhonchi Abdominal: Obese, soft, mild distention,+bs Extremities: Bilateral AKA    The results of significant diagnostics from this hospitalization (including imaging, microbiology, ancillary and laboratory) are listed below for reference.     Microbiology: Recent Results (from the past 240 hours)  Resp panel by RT-PCR (RSV, Flu A&B, Covid) Anterior Nasal Swab     Status: None   Collection Time: 04/25/23  9:10 AM   Specimen: Anterior Nasal Swab  Result Value Ref Range Status   SARS Coronavirus 2 by RT PCR NEGATIVE NEGATIVE Final    Comment: (NOTE) SARS-CoV-2 target nucleic acids are NOT DETECTED.  The SARS-CoV-2 RNA is generally detectable in upper respiratory specimens during the acute phase of infection. The lowest concentration of SARS-CoV-2 viral copies this assay can detect is 138 copies/mL. A negative result does not preclude  SARS-Cov-2 infection and should not be used as the sole basis for treatment or other patient management decisions. A negative result may occur with  improper specimen collection/handling, submission of specimen other than nasopharyngeal swab, presence of viral mutation(s) within the areas targeted by this assay, and inadequate number of viral copies(<138 copies/mL). A negative result must be combined with clinical observations, patient history, and epidemiological information. The expected result is Negative.  Fact Sheet for Patients:  BloggerCourse.com  Fact Sheet for Healthcare Providers:  SeriousBroker.it  This test is no t yet approved or cleared by the [place] FDA and  has been authorized for detection and/or diagnosis of SARS-CoV-2 by FDA under an Emergency Use Authorization (EUA). This EUA will remain  in effect (meaning this test can be used) for the duration of the COVID-19 declaration under Section 564(b)(1) of the Act, 21 U.S.C.section 360bbb-3(b)(1), unless the authorization is terminated  or revoked sooner.       Influenza A by PCR NEGATIVE NEGATIVE Final   Influenza B by PCR NEGATIVE NEGATIVE Final    Comment: (NOTE) The Xpert Xpress SARS-CoV-2/FLU/RSV plus assay is intended as an aid in the diagnosis of influenza from Nasopharyngeal swab specimens and should not be used as a sole basis for treatment. Nasal washings and aspirates are unacceptable for Xpert Xpress SARS-CoV-2/FLU/RSV testing.  Fact Sheet for Patients: BloggerCourse.com  Fact Sheet for Healthcare Providers: SeriousBroker.it  This test is not yet approved or cleared by the [place] FDA and has been authorized for detection and/or diagnosis of SARS-CoV-2 by FDA under an Emergency Use Authorization (EUA). This EUA will remain in effect (meaning this test can be used) for the duration of  the COVID-19 declaration under Section 564(b)(1) of the Act, 21 U.S.C. section 360bbb-3(b)(1), unless the authorization is terminated or revoked.     Resp Syncytial Virus by PCR NEGATIVE NEGATIVE Final    Comment: (NOTE) Fact Sheet for Patients: BloggerCourse.com  Fact Sheet for Healthcare Providers: SeriousBroker.it  This test is not yet approved or cleared by the [place] FDA and has been authorized for detection and/or diagnosis of SARS-CoV-2 by FDA  under an Emergency Use Authorization (EUA). This EUA will remain in effect (meaning this test can be used) for the duration of the COVID-19 declaration under Section 564(b)(1) of the Act, 21 U.S.C. section 360bbb-3(b)(1), unless the authorization is terminated or revoked.  Performed at [facility], [address] [place], [place] [address]   Culture, blood (Routine X 2) w Reflex to ID Panel     Status: None (Preliminary result)   Collection Time: 04/25/23  9:32 AM   Specimen: BLOOD  Result Value Ref Range Status   Specimen Description BLOOD RFOA  Final   Special Requests   Final    BOTTLES DRAWN AEROBIC AND ANAEROBIC Blood Culture adequate volume   Culture   Final    NO GROWTH 2 DAYS Performed at [facility], [address], [place], [place] [address]    Report Status PENDING  Incomplete  Culture, blood (Routine X 2) w Reflex to ID Panel     Status: None (Preliminary result)   Collection Time: 04/25/23  9:32 AM   Specimen: BLOOD  Result Value Ref Range Status   Specimen Description BLOOD LFOA  Final   Special Requests   Final    BOTTLES DRAWN AEROBIC AND ANAEROBIC Blood Culture adequate volume   Culture   Final    NO GROWTH 2 DAYS Performed at [facility], [address] [place], [place] [address]    Report Status PENDING  Incomplete     Labs: BNP (last 3 results) Recent Labs    03/07/23 1521 03/22/23 2201 04/09/23 2025   BNP 88.1 140.0* 110.6*   Basic Metabolic Panel: Recent Labs  Lab 04/23/23 0315 04/24/23 0448 04/25/23 0932 04/26/23 1911  NA 138  --  133* 133*  K 3.5 3.8 3.8 3.0*  CL 106  --  99 101  CO2 24  --  24 23  GLUCOSE 81  --  112* 116*  BUN 16  --  16 17  CREATININE 0.76 0.70 0.72 0.73  CALCIUM 8.7*  --  9.3 8.8*  MG 1.6* 1.7  --   --    Liver Function Tests: No results for input(s): "AST", "ALT", "ALKPHOS", "BILITOT", "PROT", "ALBUMIN" in the last 168 hours. No results for input(s): "LIPASE", "AMYLASE" in the last 168 hours. No results for input(s): "AMMONIA" in the last 168 hours. CBC: Recent Labs  Lab 04/21/23 0437 04/22/23 0621 04/25/23 0431 04/26/23 1911  WBC 10.0 8.2 10.5 7.1  NEUTROABS  --   --   --  6.0  HGB 9.7* 10.1* 9.7* 8.8*  HCT 29.2* 31.0* 29.9* 27.2*  MCV 91.5 93.7 92.6 92.5  PLT 250 254 248 222   Cardiac Enzymes: No results for input(s): "CKTOTAL", "CKMB", "CKMBINDEX", "TROPONINI" in the last 168 hours. BNP: Invalid input(s): "POCBNP" CBG: No results for input(s): "GLUCAP" in the last 168 hours. D-Dimer No results for input(s): "DDIMER" in the last 72 hours. Hgb A1c No results for input(s): "HGBA1C" in the last 72 hours. Lipid Profile No results for input(s): "CHOL", "HDL", "LDLCALC", "TRIG", "CHOLHDL", "LDLDIRECT" in the last 72 hours. Thyroid function studies No results for input(s): "TSH", "T4TOTAL", "T3FREE", "THYROIDAB" in the last 72 hours.  Invalid input(s): "FREET3" Anemia work up No results for input(s): "VITAMINB12", "FOLATE", "FERRITIN", "TIBC", "IRON", "RETICCTPCT" in the last 72 hours. Urinalysis    Component Value Date/Time   COLORURINE AMBER (A) 04/10/2023 0127   APPEARANCEUR CLOUDY (A) 04/10/2023 0127   LABSPEC 1.016 04/10/2023 0127   PHURINE 5.0  04/10/2023 0127   GLUCOSEU NEGATIVE 04/10/2023 0127   HGBUR SMALL (A) 04/10/2023 0127   BILIRUBINUR NEGATIVE 04/10/2023 0127   KETONESUR NEGATIVE 04/10/2023 0127   PROTEINUR  NEGATIVE 04/10/2023 0127   NITRITE NEGATIVE 04/10/2023 0127   LEUKOCYTESUR SMALL (A) 04/10/2023 0127   Sepsis Labs Recent Labs  Lab 04/21/23 0437 04/22/23 0621 04/25/23 0431 04/26/23 1911  WBC 10.0 8.2 10.5 7.1   Microbiology Recent Results (from the past 240 hours)  Resp panel by RT-PCR (RSV, Flu A&B, Covid) Anterior Nasal Swab     Status: None   Collection Time: 04/25/23  9:10 AM   Specimen: Anterior Nasal Swab  Result Value Ref Range Status   SARS Coronavirus 2 by RT PCR NEGATIVE NEGATIVE Final    Comment: (NOTE) SARS-CoV-2 target nucleic acids are NOT DETECTED.  The SARS-CoV-2 RNA is generally detectable in upper respiratory specimens during the acute phase of infection. The lowest concentration of SARS-CoV-2 viral copies this assay can detect is 138 copies/mL. A negative result does not preclude SARS-Cov-2 infection and should not be used as the sole basis for treatment or other patient management decisions. A negative result may occur with  improper specimen collection/handling, submission of specimen other than nasopharyngeal swab, presence of viral mutation(s) within the areas targeted by this assay, and inadequate number of viral copies(<138 copies/mL). A negative result must be combined with clinical observations, patient history, and epidemiological information. The expected result is Negative.  Fact Sheet for Patients:  BloggerCourse.com  Fact Sheet for Healthcare Providers:  SeriousBroker.it  This test is no t yet approved or cleared by the [place] FDA and  has been authorized for detection and/or diagnosis of SARS-CoV-2 by FDA under an Emergency Use Authorization (EUA). This EUA will remain  in effect (meaning this test can be used) for the duration of the COVID-19 declaration under Section 564(b)(1) of the Act, 21 U.S.C.section 360bbb-3(b)(1), unless the authorization is terminated  or revoked  sooner.       Influenza A by PCR NEGATIVE NEGATIVE Final   Influenza B by PCR NEGATIVE NEGATIVE Final    Comment: (NOTE) The Xpert Xpress SARS-CoV-2/FLU/RSV plus assay is intended as an aid in the diagnosis of influenza from Nasopharyngeal swab specimens and should not be used as a sole basis for treatment. Nasal washings and aspirates are unacceptable for Xpert Xpress SARS-CoV-2/FLU/RSV testing.  Fact Sheet for Patients: BloggerCourse.com  Fact Sheet for Healthcare Providers: SeriousBroker.it  This test is not yet approved or cleared by the [place] FDA and has been authorized for detection and/or diagnosis of SARS-CoV-2 by FDA under an Emergency Use Authorization (EUA). This EUA will remain in effect (meaning this test can be used) for the duration of the COVID-19 declaration under Section 564(b)(1) of the Act, 21 U.S.C. section 360bbb-3(b)(1), unless the authorization is terminated or revoked.     Resp Syncytial Virus by PCR NEGATIVE NEGATIVE Final    Comment: (NOTE) Fact Sheet for Patients: BloggerCourse.com  Fact Sheet for Healthcare Providers: SeriousBroker.it  This test is not yet approved or cleared by the [place] FDA and has been authorized for detection and/or diagnosis of SARS-CoV-2 by FDA under an Emergency Use Authorization (EUA). This EUA will remain in effect (meaning this test can be used) for the duration of the COVID-19 declaration under Section 564(b)(1) of the Act, 21 U.S.C. section 360bbb-3(b)(1), unless the authorization is terminated or revoked.  Performed at [facility], [address], [place], [place] [address]   Culture,  blood (Routine X 2) w Reflex to ID Panel     Status: None (Preliminary result)   Collection Time: 04/25/23  9:32 AM   Specimen: BLOOD  Result Value Ref Range Status   Specimen Description BLOOD RFOA   Final   Special Requests   Final    BOTTLES DRAWN AEROBIC AND ANAEROBIC Blood Culture adequate volume   Culture   Final    NO GROWTH 2 DAYS Performed at [facility], [address] [place], [place] [address]    Report Status PENDING  Incomplete  Culture, blood (Routine X 2) w Reflex to ID Panel     Status: None (Preliminary result)   Collection Time: 04/25/23  9:32 AM   Specimen: BLOOD  Result Value Ref Range Status   Specimen Description BLOOD LFOA  Final   Special Requests   Final    BOTTLES DRAWN AEROBIC AND ANAEROBIC Blood Culture adequate volume   Culture   Final    NO GROWTH 2 DAYS Performed at [facility], [address], [place], [place] [address]    Report Status PENDING  Incomplete     Time coordinating discharge: Over 30 minutes  SIGNED:   Tresa Moore, MD  [facility] 04/27/2023, 10:10 AM Pager   If 7PM-7AM, please contact night-coverage

## 2023-04-27 NOTE — Progress Notes (Signed)
 Patient refused both disimpaction and SMOG enema, MD's are aware.

## 2023-04-27 NOTE — Consult Note (Signed)
 PHARMACY - ANTICOAGULATION CONSULT NOTE  Pharmacy Consult for Warfarin Indication: hx recurrent VTE  Patient Measurements: Height: 4\' 10"  (147.3 cm) Weight: (!) 136.2 kg (300 lb 4.8 oz) IBW/kg (Calculated) : 45.4  Labs: Recent Labs    04/09/23 2025 04/09/23 2132 04/10/23 0127 04/10/23 1051  HGB 10.9*  --   --  10.8*  HCT 31.4*  --   --  31.4*  PLT 299  --   --  229  APTT  --   --  55*  --   LABPROT  --   --  30.6*  --   INR  --   --  2.9*  --   CREATININE 3.89*  --   --  3.11*  TROPONINIHS  --  22*  --   --    Estimated Creatinine Clearance: 26.6 mL/min (A) (by C-G formula based on SCr of 3.11 mg/dL (H)).  Medical History: Past Medical History:  Diagnosis Date   Depression    History of blood clots 08/13/2011   History of left knee replacement    HTN (hypertension)    Lymphedema    lt leg-per pt   PE (pulmonary thromboembolism) (HCC)    Pressure injury of skin, unspecified injury stage, unspecified location    S/P AKA (above knee amputation) bilateral (HCC)    rt 11/24/07 and lt 01/11/17   PTA warfarin dosing: Warfarin dosing difficult to determine. Went to speak with patient who could not give a firm regimen. Base dose is 5 mg but phones in INR weekly with clinic. He estimates an average of warfarin 6 mg daily. Last dose was 2/4 morning.  Assessment: 68 y.o. 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 Patient was hospitalized last month for recurrent VTE. Patient was on warfarin at the time of that VTE, but at presentation INR was subtherapeutic at 1.2 due to lack of enoxaparin bridge, and so warfarin was seen as not a therapeutic failure. Pt has a large body habitus, and will require higher doses of warfarin. Unsure what his dose was outpatient if the clinic made any adjustments, so not sure what caused the  supratherapeutic INR. Last admission we had to give 10-15 mg doses to get INR therapeutic. No major DDI. Albumin is low at 2.8, which would allow more free warfarin.   Goal of Therapy:  INR 2-3 Monitor platelets by anticoagulation protocol: Yes  Monitoring: Date INR Warfarin Dose   2/5 2.9 6 mg  2/6 4.0 Hold  2/7 4.5 Hold, Vit K 1mg  IV for upcoming port removal   2/8 2.8 HOLD for procedure  2/9 2.1 HOLD for procedure  2/10 1.9 10 mg  2/11 1.9 10 mg  2/12 3.0 2.5 mg  2/13 3.4 5 mg  2/14 3.0 3 mg  2/15 3.1 2.5 mg  2/16 2.5 5 mg   2/17 2.4 5 mg  2/18 2.1 6 mg  2/19 2.1 6 mg  2/20 2.2 6 mg  2/21 2.6 5 mg  2/22 2.6    Plan:  --INR within therapeutic range  --Will order warfarin 5 mg x 1 today --Monitor CBC per protocol and for signs/symptoms of bleeding --Monitor INR daily   [name] 04/27/2023 7:20 AM

## 2023-04-27 NOTE — Consult Note (Signed)
 Patient ID: [name], male   DOB: [birth year], 68 y.o.   MRN: [id number]  HPI [name] is a 68 y.o. male seen in consultation at the Request of Dr Georgeann Oppenheim case d/w him in detail. HE has a complex and extensive hx including  stage IV prostate cancer, recurrent VTE, hypertension, PVD status post AKA, decubitus ulcers. He  was admitted on 1/18 with shortness of breath and was found to have recurrent DVT despite Eliquis and then Coumadin (not technically a failure as he was not bridged.)    During hospitalization he was noted to have volume overload and was diuresed.  Stay was also complicated by ileus for stool impaction that was treated aggressively with a bowel regimen Hospital stay has been complex.  CT revealed multiple septic emboli in the lungs.  Port removed andThe tip culture was positive for MRSA infection.  He underwent TEE on 2/11.  He had further respiratory compromise throughout admission and was on prolonged BiPAP.  Further respiratory compromise appeared to be secondary to acute CHF exacerbation.  He was treated with IV diuresis and had more than 15 L net fluid loss since admission.  Respiratory failure has improved and he has been weaned off of oxygen.   I was asked to see him regarding chronic sigmoid dilation, this has been intermittent and chronic. He has had GI evaluation before. HE endorses no abdominal pain and passing flatus . Had liquid BM this am. CT and KUB pers reviewed showing dilation of colon c/w Ogilvie's. There was a talked about potential intervention if he were to failed medical rx and decompression HE did have decompression on 1/29, images pers reviewed. HE wishes to go home. He is ready. He is not interested in any type of surgical intervention and let alone in potential ostomy  HPI  Past Medical History:  Diagnosis Date   Depression    History of blood clots 08/13/2011   History of left knee replacement    HTN (hypertension)    Lymphedema     lt leg-per pt   PE (pulmonary thromboembolism) (HCC)    Pressure injury of skin, unspecified injury stage, unspecified location    S/P AKA (above knee amputation) bilateral (HCC)    rt 11/24/07 and lt 01/11/17    Past Surgical History:  Procedure Laterality Date   ACHILLES TENDON SURGERY Left    per pt   BIOPSY  04/03/2023   Procedure: BIOPSY;  Surgeon: [name], MD;  Location: [facility] ENDOSCOPY;  Service: Endoscopy;;   bone clip removal Left 10/04/2015   knee   CARDIAC SURGERY  11/2012   cath.-per pt   CERVICAL FUSION  07/28/2015   per pt   COLONOSCOPY WITH PROPOFOL N/A 04/03/2023   Procedure: COLONOSCOPY WITH PROPOFOL;  Surgeon: [name], MD;  Location: [facility] ENDOSCOPY;  Service: Endoscopy;  Laterality: N/A;   EPIGASTRIC HERNIA REPAIR     per pt   hematoma removal Left 11/02/2015   knee   HIP SURGERY Right    x8 per pt   IR IMAGING GUIDED PORT INSERTION  11/14/2022   IR REMOVAL TUN ACCESS W/ PORT W/O FL MOD SED  04/14/2023   KNEE FUSION Right    KNEE JOINT MANIPULATION Left 09/11/2011   KNEE SURGERY Left    x3   leg stump Left 03/2017   leg surgery-per pt   NECK SURGERY  02/2015   plates-per pt   REPLACEMENT TOTAL KNEE Right    REPLACEMENT TOTAL KNEE Left  07/02/2011   again in 07/10/2011 per pt   s/p of bilateral AKA Right    screen filter removal  10/20/2008   for embolism per pt   screen filter replacement     x3   SHOULDER SURGERY Right    x2-per pt   SPINE SURGERY  02/11/2015   vertebra spacer-per pt   TEE WITHOUT CARDIOVERSION N/A 04/16/2023   Procedure: TRANSESOPHAGEAL ECHOCARDIOGRAM (TEE);  Surgeon: Clotilde Dieter, DO;  Location: [facility] ORS;  Service: Cardiovascular;  Laterality: N/A;   toe nail removal Right    per pt   VARICOSE VEIN SURGERY     x5-per pt    Family History  Problem Relation Age of Onset   Alzheimer's disease Mother    Lung cancer Father     Social History Social History   Tobacco Use   Smoking status: Never   Smokeless  tobacco: Former  Building services engineer status: Never Used  Substance Use Topics   Alcohol use: Yes    Alcohol/week: 3.0 standard drinks of alcohol    Types: 3 Glasses of wine per week   Drug use: Never    Allergies  Allergen Reactions   Bacitracin Hives   Vancomycin Other (See Comments) and Rash    Red man syndrome.   Ketorolac     Other Reaction(s): Kidney Disorder  Other reaction(s): Kidney Disorder   Spironolactone     Other Reaction(s): Unknown  Other reaction(s): Unknown    Current Facility-Administered Medications  Medication Dose Route Frequency Provider Last Rate Last Admin   acetaminophen (TYLENOL) tablet 650 mg  650 mg Oral Q6H PRN Lolita Patella B, MD   650 mg at 04/26/23 1856   diclofenac Sodium (VOLTAREN) 1 % topical gel 4 g  4 g Topical QID Lolita Patella B, MD   4 g at 04/27/23 0841   Fe Fum-Vit C-Vit B12-FA (TRIGELS-F FORTE) capsule 1 capsule  1 capsule Oral QPC breakfast Lurene Shadow, MD   1 capsule at 04/27/23 1610   furosemide (LASIX) tablet 20 mg  20 mg Oral Daily Lurene Shadow, MD   20 mg at 04/27/23 9604   gabapentin (NEURONTIN) capsule 200 mg  200 mg Oral TID Lurene Shadow, MD   200 mg at 04/27/23 5409   HYDROmorphone (DILAUDID) injection 0.5 mg  0.5 mg Intravenous Q3H PRN Lurene Shadow, MD   0.5 mg at 04/24/23 1352   ipratropium-albuterol (DUONEB) 0.5-2.5 (3) MG/3ML nebulizer solution 3 mL  3 mL Nebulization Q4H PRN Lurene Shadow, MD       leptospermum manuka honey (MEDIHONEY) paste 1 Application  1 Application Topical Daily Lurene Shadow, MD   1 Application at 04/26/23 0953   lidocaine (LIDODERM) 5 % 1 patch  1 patch Transdermal Q24H Lurene Shadow, MD   1 patch at 04/25/23 1701   linezolid (ZYVOX) tablet 600 mg  600 mg Oral Q12H Lynn Ito, MD   600 mg at 04/27/23 8119   methocarbamol (ROBAXIN) tablet 500 mg  500 mg Oral TID Lolita Patella B, MD   500 mg at 04/27/23 1478   mupirocin ointment (BACTROBAN) 2 %   Nasal BID Lurene Shadow, MD   Given at 04/27/23 2956   nystatin cream (MYCOSTATIN)   Topical BID Lurene Shadow, MD   Given at 04/26/23 2334   ondansetron [facility]) tablet 8 mg  8 mg Oral Q8H PRN Lurene Shadow, MD       oxyCODONE (Oxy IR/ROXICODONE) immediate release tablet 5-10 mg  5-10  mg Oral Q4H PRN Lolita Patella B, MD   10 mg at 04/24/23 1743   potassium chloride SA (KLOR-CON M) CR tablet 40 mEq  40 mEq Oral Q2H Sreenath, Sudheer B, MD   40 mEq at 04/27/23 1610   simethicone (MYLICON) chewable tablet 80 mg  80 mg Oral QID Lolita Patella B, MD   80 mg at 04/27/23 0839   sorbitol, magnesium hydroxide, mineral oil, glycerin (SMOG) enema  960 mL Rectal Once Lolita Patella B, MD       warfarin (COUMADIN) tablet 5 mg  5 mg Oral ONCE-1600 [name], [facility]       Warfarin - Pharmacist Dosing Inpatient   Does not apply [id number] Lurene Shadow, MD   Given at 04/26/23 1611     Review of Systems Full ROS  was asked and was negative except for the information on the HPI  Physical Exam Blood pressure 111/64, pulse 77, temperature 98.4 F (36.9 C), resp. rate 20, height 4\' 10"  (1.473 m), weight (!) 136.6 kg, SpO2 95%. CONSTITUTIONAL: NAd chronically ill w High BMI. EYES: Pupils are equal, round, and reactive to light, Sclera are non-icteric. EARS, NOSE, MOUTH AND THROAT: The oropharynx is clear. The oral mucosa is pink and moist. Hearing is intact to voice. LYMPH NODES:  Lymph nodes in the neck are normal. RESPIRATORY:  Lungs are clear. There is normal respiratory effort, with equal breath sounds bilaterally, and without pathologic use of accessory muscles. CARDIOVASCULAR: Heart is regular without murmurs, gallops, or rubs. GI: The abdomen is soft, nontender distended. There are no palpable masses. There is no hepatosplenomegaly. There are decrease  bowel sounds . No peritonitis  GU: Rectal deferred.   MUSCULOSKELETAL: Normal muscle strength and tone. Bilateral AKA stumps compression bandages in  place SKIN: Turgor is good and there are no pathologic skin lesions or ulcers. NEUROLOGIC: Motor and sensation is grossly normal. Cranial nerves are grossly intact. PSYCH:  Oriented to person, place and time. Affect is normal.  Data Reviewed  I have personally reviewed the patient's imaging, laboratory findings and medical records.    Assessment/Plan 68 yo male with a very complex and extensive hx including  stage IV prostate cancer, recurrent VTE, hypertension, PVD status post AKA, decubitus ulcers.  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  At this point I do not recommend any surgical intervention.  Discussed with him about bowel regimen.  I also discussed with him possibility of surgical intervention if he were to fail other medical and endoscopic interventions and he is adamantly against that.  He is not interested at all in any surgical intervention let alone in a potential ostomy. At this time I do not have much to offer. We will be available  Sterling Big, MD FACS General Surgeon 04/27/2023, 10:44 AM

## 2023-04-27 NOTE — Progress Notes (Signed)
 Patient introduced to role of nurse navigator. Intake questions completed. Patient discharging home today, per his own request.   Patient aware HH and DME delivery will not occur until Monday.   Patient requesting EMS transport home. Pt states he can enter his home via his "garage" and then transfer to his "powerchair."  Patient aware of importance of follow ups and medication compliance.

## 2023-04-28 ENCOUNTER — Other Ambulatory Visit: Payer: Self-pay

## 2023-04-28 LAB — BPAM RBC
Blood Product Expiration Date: 202503182359
Blood Product Expiration Date: 202503192359
Unit Type and Rh: 7300
Unit Type and Rh: 7300

## 2023-04-28 LAB — TYPE AND SCREEN
ABO/RH(D): B POS
Antibody Screen: NEGATIVE
Unit division: 0
Unit division: 0

## 2023-04-28 LAB — PREPARE RBC (CROSSMATCH)

## 2023-04-29 ENCOUNTER — Other Ambulatory Visit: Payer: Self-pay

## 2023-04-30 ENCOUNTER — Other Ambulatory Visit: Payer: Self-pay

## 2023-04-30 LAB — MINIMUM INHIBITORY CONC. (1 DRUG)

## 2023-04-30 LAB — CULTURE, BLOOD (ROUTINE X 2)
Culture: NO GROWTH
Culture: NO GROWTH
Special Requests: ADEQUATE
Special Requests: ADEQUATE

## 2023-05-02 LAB — CALPROTECTIN, FECAL: Calprotectin, Fecal: 6 ug/g (ref 0–120)

## 2023-05-03 LAB — CULTURE, BLOOD (ROUTINE X 2)

## 2023-05-03 LAB — PANCREATIC ELASTASE, FECAL: Pancreatic Elastase-1, Stool: <1 ug Elast./g — ABNORMAL LOW (ref >200)

## 2023-05-05 ENCOUNTER — Telehealth: Payer: Self-pay | Admitting: Internal Medicine

## 2023-05-05 NOTE — Telephone Encounter (Signed)
 Pattricia Boss from [facility] called to inform that the patient's pastor was visiting him. Patient had a fall. He is declining to go to the hospital. He is feeling weak and expressed wishes to stop cancer center and transition to hospice. I tried to reach the patient with no response. Per patient's expressed wishes, I approved hospice orders.   I have corroborated with the palliative care note. He is DNR/DNI. I have sent staff message to patient's primary oncologist Dr. Donneta Romberg and [facility].

## 2023-05-06 ENCOUNTER — Encounter: Payer: Self-pay | Admitting: Internal Medicine

## 2023-05-06 ENCOUNTER — Telehealth: Payer: Self-pay | Admitting: Hospice and Palliative Medicine

## 2023-05-06 NOTE — Telephone Encounter (Signed)
 I spoke with Pattricia Boss from [facility].  Reportedly, patient had a fall over the weekend with unknown downtime.  Patient's pastor and several friends broke into the house after they could not reach patient.  Patient reportedly decided not to go to the hospital and instead pursue hospice.  Per Pattricia Boss, the hospice RN is in the home currently admitting patient to hospice services with a goal of keeping him home and comfortable until end-of-life.

## 2023-05-06 NOTE — Progress Notes (Signed)
 As per the information from the on-call physician over the weekend patient had a fall, and is requesting hospice.    I think it is reasonable to proceed with hospice given his multiple prolonged hospitalizations and also given patient's reluctance with any further hospitalization at this time.  Discussed with [employer].   GB

## 2023-05-10 ENCOUNTER — Other Ambulatory Visit: Payer: Self-pay

## 2023-05-17 ENCOUNTER — Other Ambulatory Visit: Payer: Medicare PPO

## 2023-05-17 ENCOUNTER — Ambulatory Visit: Payer: Medicare PPO | Admitting: Internal Medicine

## 2023-05-23 ENCOUNTER — Telehealth: Payer: Medicare PPO | Admitting: Infectious Diseases

## 2023-05-26 ENCOUNTER — Encounter: Payer: Self-pay | Admitting: Internal Medicine

## 2023-05-26 NOTE — Progress Notes (Signed)
 Per Epic, patient referred to hospice on 05/06/23. [facility] referral closed.

## 2023-06-04 DEATH — deceased

## 2023-06-07 ENCOUNTER — Encounter: Payer: Self-pay | Admitting: Internal Medicine

## 2023-06-21 ENCOUNTER — Ambulatory Visit: Payer: Medicare PPO | Admitting: Internal Medicine

## 2023-07-24 ENCOUNTER — Telehealth: Payer: Self-pay

## 2023-07-24 NOTE — Telephone Encounter (Signed)
 5/21 Received Medical Record Release request from [facility].  Patient was not see by our providers at the [facility]- Radiation Oncology dept, but was seen in [facility] - Dr. Glenis Langdon.  Forward request to [place].

## 2024-03-17 ENCOUNTER — Other Ambulatory Visit (HOSPITAL_COMMUNITY): Payer: Self-pay
